# Patient Record
Sex: Male | Born: 1965
Health system: Southern US, Community
[De-identification: ages and names within clinical notes are randomized; demographics above are authoritative.]

## PROBLEM LIST (undated history)

## (undated) DIAGNOSIS — G629 Polyneuropathy, unspecified: Secondary | ICD-10-CM

## (undated) DIAGNOSIS — F419 Anxiety disorder, unspecified: Secondary | ICD-10-CM

## (undated) DIAGNOSIS — N189 Chronic kidney disease, unspecified: Secondary | ICD-10-CM

## (undated) DIAGNOSIS — E119 Type 2 diabetes mellitus without complications: Secondary | ICD-10-CM

## (undated) DIAGNOSIS — D45 Polycythemia vera: Secondary | ICD-10-CM

## (undated) DIAGNOSIS — I10 Essential (primary) hypertension: Secondary | ICD-10-CM

## (undated) DIAGNOSIS — H409 Unspecified glaucoma: Secondary | ICD-10-CM

## (undated) DIAGNOSIS — Z860101 Personal history of adenomatous and serrated colon polyps: Secondary | ICD-10-CM

## (undated) DIAGNOSIS — K709 Alcoholic liver disease, unspecified: Secondary | ICD-10-CM

## (undated) DIAGNOSIS — N289 Disorder of kidney and ureter, unspecified: Secondary | ICD-10-CM

## (undated) DIAGNOSIS — Z8711 Personal history of peptic ulcer disease: Secondary | ICD-10-CM

## (undated) DIAGNOSIS — F1910 Other psychoactive substance abuse, uncomplicated: Secondary | ICD-10-CM

## (undated) DIAGNOSIS — I639 Cerebral infarction, unspecified: Secondary | ICD-10-CM

## (undated) DIAGNOSIS — E7800 Pure hypercholesterolemia, unspecified: Secondary | ICD-10-CM

## (undated) DIAGNOSIS — Z86010 Personal history of colonic polyps: Secondary | ICD-10-CM

## (undated) DIAGNOSIS — M869 Osteomyelitis, unspecified: Secondary | ICD-10-CM

## (undated) DIAGNOSIS — Z8719 Personal history of other diseases of the digestive system: Secondary | ICD-10-CM

## (undated) HISTORY — DX: Personal history of adenomatous and serrated colon polyps: Z86.0101

## (undated) HISTORY — DX: Unspecified glaucoma: H40.9

## (undated) HISTORY — PX: EYE SURGERY: SHX253

## (undated) HISTORY — DX: Personal history of colonic polyps: Z86.010

## (undated) NOTE — *Deleted (*Deleted)
PCP - *** Cardiologist - ***  PPM/ICD - *** Device Orders - *** Rep Notified - ***  Chest x-ray - *** EKG - *** Stress Test - *** ECHO - *** Cardiac Cath - ***  Sleep Study - *** CPAP - ***  Fasting Blood Sugar - *** Checks Blood Sugar *** times a day  Blood Thinner Instructions: *** Aspirin Instructions: ***  ERAS Protcol - *** PRE-SURGERY Ensure or G2- ***  COVID TEST- ***   Anesthesia review: ***   -------------  SDW INSTRUCTIONS:  Your procedure is scheduled on Wednesday 11/30/19.  Report to [facility] Main Entrance "A" at 0915 A.M., and check in at the Admitting office.  Call this number if you have problems the morning of surgery: [phone]   Remember: Do not eat after midnight the night before your surgery  You may drink clear liquids until 0850 the morning of your surgery.   Clear liquids allowed are: Water, Non-Citrus Juices (without pulp), Carbonated Beverages, Clear Tea, Black Coffee Only, and Gatorade   Take these medicines the morning of surgery with A SIP OF WATER: acetaminophen (TYLENOL) if needed amLODipine (NORVASC)  ceFEPime (MAXIPIME)  dicyclomine (BENTYL)  gabapentin (NEURONTIN) pantoprazole (PROTONIX)  rosuvastatin (CRESTOR) Vancomycin Eye drops  glipiZIDE (GLUCOTROL)  11/2 day before: AM dose take as usual, no PM dose 11/3 surgery: NONE  insulin aspart (NOVOLOG) 11/2 day before: AM dose take as usual, no PM dose 11/3 surgery: NONE - if CBG >220 take half of usual correction dose  Insulin Glargine (BASAGLAR KWIKPEN)  11/2 day before: AM dose take as usual, PM dose take 7.5 units 11/3 surgery: 7.5 units  ** PLEASE check your blood sugar the morning of your surgery when you wake up and every 2 hours until you get to the Short Stay unit.  If your blood sugar is less than 70 mg/dL, you will need to treat for low blood sugar: - Do not take insulin. - Treat a low blood sugar (less than 70 mg/dL) with  cup of clear juice  (cranberry or apple), 4 glucose tablets, OR glucose gel. - Recheck blood sugar in 15 minutes after treatment (to make sure it is greater than 70 mg/dL). If your blood sugar is not greater than 70 mg/dL on recheck, call [phone] for further instructions.   Follow your surgeon's instructions on when to stop Aspirin and clopidogrel (PLAVIX) .  If no instructions were given by your surgeon then you will need to call the office to get those instructions.    As of today, STOP taking any Aleve, Naproxen, Ibuprofen, Motrin, Advil, Goody's, BC's, all herbal medications, fish oil, and all vitamins.    The Morning of Surgery  Do not wear jewelry  Do not wear lotions, powders, colognes, or deodorant Men may shave face and neck.  Do not bring valuables to the hospital.  [facility] is not responsible for any belongings or valuables.  If you are a smoker, DO NOT Smoke 24 hours prior to surgery  If you wear a CPAP at night please bring your mask the morning of surgery   Remember that you must have someone to transport you home after your surgery, and remain with you for 24 hours if you are discharged the same day.   Please bring cases for contacts, glasses, hearing aids, dentures or bridgework because it cannot be worn into surgery.    Leave your suitcase in the car.  After surgery it may be brought to your room.  For patients admitted to the hospital, discharge time will be determined by your treatment team.  Patients discharged the day of surgery will not be allowed to drive home.    Special instructions:   [facility]- Preparing For Surgery  Oral Hygiene is also important to reduce your risk of infection.  Remember - BRUSH YOUR TEETH THE MORNING OF SURGERY WITH YOUR REGULAR TOOTHPASTE  Please follow these instructions carefully.   1. Shower the NIGHT BEFORE SURGERY and the MORNING OF SURGERY with DIAL Soap.   2. Wash thoroughly, paying special attention to the area where your  surgery will be performed.  3. Thoroughly rinse your body with warm water from the neck down.  4. Pat yourself dry with a CLEAN TOWEL.  5. Wear CLEAN PAJAMAS to bed the night before surgery  6. Place CLEAN SHEETS on your bed the night of your first shower and DO NOT SLEEP WITH PETS.  7. Wear comfortable clothes the morning of surgery.    Day of Surgery:  Please shower the morning of surgery with the DIAL soap Do not apply any deodorants/lotions. Please wear clean clothes to the hospital/surgery center.   Remember to brush your teeth WITH YOUR REGULAR TOOTHPASTE.   Please read over the following fact sheets that you were given.  Patient denies shortness of breath, fever, cough and chest pain.

---

## 2001-09-23 ENCOUNTER — Emergency Department (HOSPITAL_COMMUNITY): Admission: EM | Admit: 2001-09-23 | Discharge: 2001-09-23 | Payer: Self-pay | Admitting: Emergency Medicine

## 2002-11-08 ENCOUNTER — Emergency Department (HOSPITAL_COMMUNITY): Admission: AD | Admit: 2002-11-08 | Discharge: 2002-11-08 | Payer: Self-pay | Admitting: Family Medicine

## 2002-11-11 ENCOUNTER — Encounter: Admission: RE | Admit: 2002-11-11 | Discharge: 2002-11-11 | Payer: Self-pay | Admitting: Internal Medicine

## 2003-10-19 ENCOUNTER — Ambulatory Visit: Payer: Self-pay | Admitting: *Deleted

## 2003-10-19 ENCOUNTER — Ambulatory Visit: Payer: Self-pay | Admitting: Nurse Practitioner

## 2004-01-28 DIAGNOSIS — D45 Polycythemia vera: Secondary | ICD-10-CM

## 2004-01-28 HISTORY — DX: Polycythemia vera: D45

## 2004-01-31 ENCOUNTER — Ambulatory Visit: Payer: Self-pay | Admitting: Hematology & Oncology

## 2004-02-09 ENCOUNTER — Ambulatory Visit (HOSPITAL_COMMUNITY): Admission: RE | Admit: 2004-02-09 | Discharge: 2004-02-09 | Payer: Self-pay | Admitting: Hematology & Oncology

## 2004-03-21 ENCOUNTER — Ambulatory Visit: Payer: Self-pay | Admitting: Nurse Practitioner

## 2004-03-25 ENCOUNTER — Ambulatory Visit: Payer: Self-pay | Admitting: Hematology & Oncology

## 2004-04-27 ENCOUNTER — Emergency Department (HOSPITAL_COMMUNITY): Admission: EM | Admit: 2004-04-27 | Discharge: 2004-04-27 | Payer: Self-pay | Admitting: Emergency Medicine

## 2004-05-02 ENCOUNTER — Emergency Department (HOSPITAL_COMMUNITY): Admission: EM | Admit: 2004-05-02 | Discharge: 2004-05-02 | Payer: Self-pay | Admitting: Emergency Medicine

## 2004-05-16 ENCOUNTER — Ambulatory Visit: Payer: Self-pay | Admitting: Hematology & Oncology

## 2004-07-03 ENCOUNTER — Ambulatory Visit: Payer: Self-pay | Admitting: Hematology & Oncology

## 2004-08-21 ENCOUNTER — Ambulatory Visit: Payer: Self-pay | Admitting: Hematology & Oncology

## 2004-10-16 ENCOUNTER — Ambulatory Visit: Payer: Self-pay | Admitting: Hematology & Oncology

## 2004-11-22 ENCOUNTER — Ambulatory Visit: Payer: Self-pay | Admitting: Nurse Practitioner

## 2005-01-01 ENCOUNTER — Ambulatory Visit: Payer: Self-pay | Admitting: Hematology & Oncology

## 2005-02-28 ENCOUNTER — Ambulatory Visit: Payer: Self-pay | Admitting: Hematology & Oncology

## 2005-05-01 ENCOUNTER — Ambulatory Visit: Payer: Self-pay | Admitting: Hematology & Oncology

## 2005-05-23 LAB — CBC WITH DIFFERENTIAL/PLATELET
BASO%: 0.7 % (ref 0.0–2.0)
Basophils Absolute: 0 10*3/uL (ref 0.0–0.1)
EOS%: 4.7 % (ref 0.0–7.0)
HGB: 14.9 g/dL (ref 13.0–17.1)
MCH: 26.8 pg — ABNORMAL LOW (ref 28.0–33.4)
MCV: 81.8 fL (ref 81.6–98.0)
MONO%: 10.6 % (ref 0.0–13.0)
NEUT#: 1.8 10*3/uL (ref 1.5–6.5)
RBC: 5.59 10*6/uL (ref 4.20–5.71)
RDW: 16.1 % — ABNORMAL HIGH (ref 11.2–14.6)
lymph#: 2.3 10*3/uL (ref 0.9–3.3)

## 2005-07-24 ENCOUNTER — Ambulatory Visit: Payer: Self-pay | Admitting: Family Medicine

## 2005-08-12 ENCOUNTER — Ambulatory Visit: Payer: Self-pay | Admitting: Hematology & Oncology

## 2005-09-19 LAB — CBC WITH DIFFERENTIAL/PLATELET
BASO%: 1.1 % (ref 0.0–2.0)
Basophils Absolute: 0.1 10*3/uL (ref 0.0–0.1)
EOS%: 4.3 % (ref 0.0–7.0)
HCT: 48.3 % (ref 38.7–49.9)
LYMPH%: 42.7 % (ref 14.0–48.0)
MCH: 27.4 pg — ABNORMAL LOW (ref 28.0–33.4)
MCHC: 31.5 g/dL — ABNORMAL LOW (ref 32.0–35.9)
MCV: 87 fL (ref 81.6–98.0)
NEUT%: 40.4 % (ref 40.0–75.0)
Platelets: 150 10*3/uL (ref 145–400)

## 2005-09-30 ENCOUNTER — Ambulatory Visit: Payer: Self-pay | Admitting: Hematology & Oncology

## 2005-10-07 LAB — CBC WITH DIFFERENTIAL/PLATELET
BASO%: 1.2 % (ref 0.0–2.0)
EOS%: 3.2 % (ref 0.0–7.0)
MCH: 28.1 pg (ref 28.0–33.4)
MCHC: 32.3 g/dL (ref 32.0–35.9)
MCV: 86.9 fL (ref 81.6–98.0)
MONO%: 13.3 % — ABNORMAL HIGH (ref 0.0–13.0)
NEUT%: 45 % (ref 40.0–75.0)
RDW: 15.2 % — ABNORMAL HIGH (ref 11.2–14.6)
lymph#: 2 10*3/uL (ref 0.9–3.3)

## 2005-11-05 ENCOUNTER — Ambulatory Visit: Payer: Self-pay | Admitting: Nurse Practitioner

## 2005-12-10 ENCOUNTER — Ambulatory Visit: Payer: Self-pay | Admitting: Hematology & Oncology

## 2006-02-10 ENCOUNTER — Ambulatory Visit: Payer: Self-pay | Admitting: Hematology & Oncology

## 2006-02-19 LAB — CBC WITH DIFFERENTIAL/PLATELET
Basophils Absolute: 0 10*3/uL (ref 0.0–0.1)
Eosinophils Absolute: 0.2 10*3/uL (ref 0.0–0.5)
HGB: 13.6 g/dL (ref 13.0–17.1)
MCV: 79.2 fL — ABNORMAL LOW (ref 81.6–98.0)
MONO#: 0.7 10*3/uL (ref 0.1–0.9)
MONO%: 12.5 % (ref 0.0–13.0)
NEUT#: 3 10*3/uL (ref 1.5–6.5)
RBC: 5.27 10*6/uL (ref 4.20–5.71)
RDW: 16.9 % — ABNORMAL HIGH (ref 11.2–14.6)
WBC: 6 10*3/uL (ref 4.0–10.0)
lymph#: 2 10*3/uL (ref 0.9–3.3)

## 2006-04-07 ENCOUNTER — Emergency Department (HOSPITAL_COMMUNITY): Admission: EM | Admit: 2006-04-07 | Discharge: 2006-04-07 | Payer: Self-pay | Admitting: Emergency Medicine

## 2006-06-16 ENCOUNTER — Ambulatory Visit: Payer: Self-pay | Admitting: Hematology & Oncology

## 2006-07-14 ENCOUNTER — Ambulatory Visit: Payer: Self-pay | Admitting: Internal Medicine

## 2006-07-16 ENCOUNTER — Ambulatory Visit (HOSPITAL_COMMUNITY): Admission: RE | Admit: 2006-07-16 | Discharge: 2006-07-16 | Payer: Self-pay | Admitting: Nurse Practitioner

## 2006-08-10 ENCOUNTER — Ambulatory Visit: Payer: Self-pay | Admitting: Hematology & Oncology

## 2006-08-12 LAB — CBC WITH DIFFERENTIAL/PLATELET
Basophils Absolute: 0.1 10*3/uL (ref 0.0–0.1)
Eosinophils Absolute: 0.3 10*3/uL (ref 0.0–0.5)
HCT: 47.3 % (ref 38.7–49.9)
HGB: 14.7 g/dL (ref 13.0–17.1)
LYMPH%: 49.8 % — ABNORMAL HIGH (ref 14.0–48.0)
MCV: 82.4 fL (ref 81.6–98.0)
MONO#: 0.5 10*3/uL (ref 0.1–0.9)
MONO%: 11.8 % (ref 0.0–13.0)
NEUT#: 1.3 10*3/uL — ABNORMAL LOW (ref 1.5–6.5)
Platelets: 177 10*3/uL (ref 145–400)
RBC: 5.74 10*6/uL — ABNORMAL HIGH (ref 4.20–5.71)
WBC: 4.5 10*3/uL (ref 4.0–10.0)

## 2006-08-20 LAB — CBC WITH DIFFERENTIAL/PLATELET
BASO%: 1.1 % (ref 0.0–2.0)
Eosinophils Absolute: 0.3 10*3/uL (ref 0.0–0.5)
LYMPH%: 40.8 % (ref 14.0–48.0)
MCHC: 31.2 g/dL — ABNORMAL LOW (ref 32.0–35.9)
MONO#: 0.7 10*3/uL (ref 0.1–0.9)
NEUT#: 1.9 10*3/uL (ref 1.5–6.5)
RBC: 5.17 10*6/uL (ref 4.20–5.71)
RDW: 14.8 % — ABNORMAL HIGH (ref 11.2–14.6)
WBC: 5 10*3/uL (ref 4.0–10.0)
lymph#: 2 10*3/uL (ref 0.9–3.3)

## 2006-10-14 ENCOUNTER — Encounter (INDEPENDENT_AMBULATORY_CARE_PROVIDER_SITE_OTHER): Payer: Self-pay | Admitting: *Deleted

## 2006-11-04 ENCOUNTER — Ambulatory Visit: Payer: Self-pay | Admitting: Hematology & Oncology

## 2007-01-18 ENCOUNTER — Ambulatory Visit: Payer: Self-pay | Admitting: Hematology & Oncology

## 2007-01-22 LAB — CBC WITH DIFFERENTIAL/PLATELET
BASO%: 1.2 % (ref 0.0–2.0)
Eosinophils Absolute: 0.4 10*3/uL (ref 0.0–0.5)
HCT: 45.6 % (ref 38.7–49.9)
LYMPH%: 45 % (ref 14.0–48.0)
MCHC: 30.8 g/dL — ABNORMAL LOW (ref 32.0–35.9)
MONO#: 0.6 10*3/uL (ref 0.1–0.9)
NEUT#: 1.3 10*3/uL — ABNORMAL LOW (ref 1.5–6.5)
NEUT%: 29.1 % — ABNORMAL LOW (ref 40.0–75.0)
Platelets: 158 10*3/uL (ref 145–400)
WBC: 4.4 10*3/uL (ref 4.0–10.0)
lymph#: 2 10*3/uL (ref 0.9–3.3)

## 2007-03-10 ENCOUNTER — Ambulatory Visit: Payer: Self-pay | Admitting: Hematology & Oncology

## 2007-03-12 LAB — CBC WITH DIFFERENTIAL/PLATELET
Basophils Absolute: 0 10*3/uL (ref 0.0–0.1)
Eosinophils Absolute: 0.3 10*3/uL (ref 0.0–0.5)
LYMPH%: 46.7 % (ref 14.0–48.0)
MCH: 26.7 pg — ABNORMAL LOW (ref 28.0–33.4)
MCHC: 32.8 g/dL (ref 32.0–35.9)
MONO%: 12.6 % (ref 0.0–13.0)
NEUT%: 31.9 % — ABNORMAL LOW (ref 40.0–75.0)
RDW: 18.9 % — ABNORMAL HIGH (ref 11.2–14.6)
WBC: 4.3 10*3/uL (ref 4.0–10.0)
lymph#: 2 10*3/uL (ref 0.9–3.3)

## 2007-04-15 ENCOUNTER — Ambulatory Visit: Payer: Self-pay | Admitting: Family Medicine

## 2007-04-15 ENCOUNTER — Encounter (INDEPENDENT_AMBULATORY_CARE_PROVIDER_SITE_OTHER): Payer: Self-pay | Admitting: Nurse Practitioner

## 2007-04-15 LAB — CONVERTED CEMR LAB
Albumin: 3.9 g/dL (ref 3.5–5.2)
Alkaline Phosphatase: 58 units/L (ref 39–117)
CO2: 21 meq/L (ref 19–32)
Chloride: 104 meq/L (ref 96–112)
Cholesterol: 199 mg/dL (ref 0–200)
Glucose, Bld: 180 mg/dL — ABNORMAL HIGH (ref 70–99)
LDL Cholesterol: 118 mg/dL — ABNORMAL HIGH (ref 0–99)
Lymphocytes Relative: 49 % — ABNORMAL HIGH (ref 12–46)
Lymphs Abs: 2.1 10*3/uL (ref 0.7–4.0)
Neutro Abs: 1.3 10*3/uL — ABNORMAL LOW (ref 1.7–7.7)
Neutrophils Relative %: 30 % — ABNORMAL LOW (ref 43–77)
Platelets: 181 10*3/uL (ref 150–400)
Potassium: 4.5 meq/L (ref 3.5–5.3)
Sodium: 139 meq/L (ref 135–145)
Total Protein: 7.1 g/dL (ref 6.0–8.3)
Triglycerides: 183 mg/dL — ABNORMAL HIGH (ref <150)
WBC: 4.2 10*3/uL (ref 4.0–10.5)

## 2007-06-04 ENCOUNTER — Ambulatory Visit: Payer: Self-pay | Admitting: Hematology & Oncology

## 2007-09-24 ENCOUNTER — Ambulatory Visit: Payer: Self-pay | Admitting: Oncology

## 2007-09-29 LAB — CBC WITH DIFFERENTIAL/PLATELET
BASO%: 0.6 % (ref 0.0–2.0)
Basophils Absolute: 0 10*3/uL (ref 0.0–0.1)
Eosinophils Absolute: 0.4 10*3/uL (ref 0.0–0.5)
HCT: 43.1 % (ref 38.7–49.9)
HGB: 14.4 g/dL (ref 13.0–17.1)
LYMPH%: 48.9 % — ABNORMAL HIGH (ref 14.0–48.0)
MCHC: 33.6 g/dL (ref 32.0–35.9)
MONO#: 0.4 10*3/uL (ref 0.1–0.9)
NEUT#: 1.6 10*3/uL (ref 1.5–6.5)
NEUT%: 34.1 % — ABNORMAL LOW (ref 40.0–75.0)
Platelets: 161 10*3/uL (ref 145–400)
WBC: 4.8 10*3/uL (ref 4.0–10.0)
lymph#: 2.3 10*3/uL (ref 0.9–3.3)

## 2007-09-29 LAB — CHCC SMEAR

## 2007-09-29 LAB — MORPHOLOGY: PLT EST: ADEQUATE

## 2007-10-06 LAB — COMPREHENSIVE METABOLIC PANEL
ALT: 21 U/L (ref 0–53)
AST: 21 U/L (ref 0–37)
CO2: 20 mEq/L (ref 19–32)
Calcium: 9.3 mg/dL (ref 8.4–10.5)
Chloride: 106 mEq/L (ref 96–112)
Creatinine, Ser: 0.9 mg/dL (ref 0.40–1.50)
Sodium: 139 mEq/L (ref 135–145)
Total Bilirubin: 0.5 mg/dL (ref 0.3–1.2)
Total Protein: 7 g/dL (ref 6.0–8.3)

## 2007-10-06 LAB — IRON AND TIBC
%SAT: 32 % (ref 20–55)
TIBC: 389 ug/dL (ref 215–435)

## 2007-10-06 LAB — JAK2 EXONS 12 & 13 MUTATION, REFLEX: JAK2 Exons 12 & 13: 13

## 2007-10-06 LAB — ERYTHROPOIETIN: Erythropoietin: 41.4 m[IU]/mL — ABNORMAL HIGH (ref 2.6–34.0)

## 2007-10-06 LAB — JAK2 GENOTYPR

## 2007-11-10 ENCOUNTER — Encounter (INDEPENDENT_AMBULATORY_CARE_PROVIDER_SITE_OTHER): Payer: Self-pay | Admitting: Internal Medicine

## 2007-11-10 ENCOUNTER — Ambulatory Visit: Payer: Self-pay | Admitting: Internal Medicine

## 2007-11-10 LAB — CONVERTED CEMR LAB
Albumin: 4.1 g/dL (ref 3.5–5.2)
Alkaline Phosphatase: 76 units/L (ref 39–117)
BUN: 8 mg/dL (ref 6–23)
Benzodiazepines.: NEGATIVE
CO2: 25 meq/L (ref 19–32)
Calcium: 9.2 mg/dL (ref 8.4–10.5)
Chloride: 103 meq/L (ref 96–112)
Creatinine,U: 204.1 mg/dL
Eosinophils Absolute: 0.4 10*3/uL (ref 0.0–0.7)
Glucose, Bld: 175 mg/dL — ABNORMAL HIGH (ref 70–99)
Lymphs Abs: 2.3 10*3/uL (ref 0.7–4.0)
MCV: 89.8 fL (ref 78.0–100.0)
Methadone: NEGATIVE
Microalb, Ur: 2.39 mg/dL — ABNORMAL HIGH (ref 0.00–1.89)
Monocytes Relative: 13 % — ABNORMAL HIGH (ref 3–12)
Neutrophils Relative %: 35 % — ABNORMAL LOW (ref 43–77)
PSA: 0.38 ng/mL (ref 0.10–4.00)
Potassium: 4.4 meq/L (ref 3.5–5.3)
Propoxyphene: NEGATIVE
RBC: 5.11 M/uL (ref 4.22–5.81)
WBC: 5.2 10*3/uL (ref 4.0–10.5)

## 2008-02-16 ENCOUNTER — Ambulatory Visit: Payer: Self-pay | Admitting: Oncology

## 2008-04-05 ENCOUNTER — Ambulatory Visit: Payer: Self-pay | Admitting: Oncology

## 2008-04-07 LAB — COMPREHENSIVE METABOLIC PANEL
ALT: 19 U/L (ref 0–53)
BUN: 5 mg/dL — ABNORMAL LOW (ref 6–23)
CO2: 24 mEq/L (ref 19–32)
Calcium: 9.2 mg/dL (ref 8.4–10.5)
Chloride: 100 mEq/L (ref 96–112)
Creatinine, Ser: 1.01 mg/dL (ref 0.40–1.50)
Glucose, Bld: 160 mg/dL — ABNORMAL HIGH (ref 70–99)
Total Bilirubin: 0.7 mg/dL (ref 0.3–1.2)

## 2008-04-07 LAB — CBC WITH DIFFERENTIAL/PLATELET
BASO%: 0.7 % (ref 0.0–2.0)
EOS%: 7.8 % — ABNORMAL HIGH (ref 0.0–7.0)
LYMPH%: 43.2 % (ref 14.0–49.0)
MCH: 29.6 pg (ref 27.2–33.4)
MCHC: 33.2 g/dL (ref 32.0–36.0)
MONO#: 0.5 10*3/uL (ref 0.1–0.9)
NEUT%: 38.2 % — ABNORMAL LOW (ref 39.0–75.0)
Platelets: 159 10*3/uL (ref 140–400)
RBC: 5.27 10*6/uL (ref 4.20–5.82)
WBC: 4.8 10*3/uL (ref 4.0–10.3)

## 2008-04-07 LAB — MORPHOLOGY

## 2008-04-07 LAB — CHCC SMEAR

## 2008-07-12 ENCOUNTER — Ambulatory Visit: Payer: Self-pay | Admitting: Family Medicine

## 2009-01-10 ENCOUNTER — Ambulatory Visit: Payer: Self-pay | Admitting: Oncology

## 2009-02-27 ENCOUNTER — Ambulatory Visit: Payer: Self-pay | Admitting: Internal Medicine

## 2009-03-14 ENCOUNTER — Ambulatory Visit: Payer: Self-pay | Admitting: Oncology

## 2009-03-30 LAB — MORPHOLOGY: RBC Comments: NORMAL

## 2009-03-30 LAB — CBC WITH DIFFERENTIAL/PLATELET
BASO%: 0.5 % (ref 0.0–2.0)
Eosinophils Absolute: 0.3 10*3/uL (ref 0.0–0.5)
MCHC: 33.2 g/dL (ref 32.0–36.0)
MONO#: 0.5 10*3/uL (ref 0.1–0.9)
MONO%: 10.2 % (ref 0.0–14.0)
NEUT#: 2.1 10*3/uL (ref 1.5–6.5)
RBC: 5.07 10*6/uL (ref 4.20–5.82)
RDW: 13.6 % (ref 11.0–14.6)
WBC: 4.7 10*3/uL (ref 4.0–10.3)

## 2009-03-30 LAB — CHCC SMEAR

## 2009-10-04 ENCOUNTER — Ambulatory Visit: Payer: Self-pay | Admitting: Family Medicine

## 2010-05-02 ENCOUNTER — Emergency Department (HOSPITAL_COMMUNITY): Admission: EM | Admit: 2010-05-02 | Payer: Self-pay | Source: Home / Self Care

## 2010-10-22 ENCOUNTER — Inpatient Hospital Stay (INDEPENDENT_AMBULATORY_CARE_PROVIDER_SITE_OTHER)
Admission: RE | Admit: 2010-10-22 | Discharge: 2010-10-22 | Disposition: A | Discharge status: Home / Self Care | Payer: Self-pay | Source: Ambulatory Visit | Attending: Family Medicine | Admitting: Family Medicine

## 2010-10-22 DIAGNOSIS — Z760 Encounter for issue of repeat prescription: Secondary | ICD-10-CM

## 2011-05-06 ENCOUNTER — Other Ambulatory Visit: Payer: Self-pay | Admitting: Family Medicine

## 2011-05-06 DIAGNOSIS — N63 Unspecified lump in unspecified breast: Secondary | ICD-10-CM

## 2011-05-12 ENCOUNTER — Ambulatory Visit
Admission: RE | Admit: 2011-05-12 | Discharge: 2011-05-12 | Disposition: A | Discharge status: Home / Self Care | Payer: Self-pay | Source: Ambulatory Visit | Attending: Family Medicine | Admitting: Family Medicine

## 2011-05-12 DIAGNOSIS — N63 Unspecified lump in unspecified breast: Secondary | ICD-10-CM

## 2011-09-03 ENCOUNTER — Encounter (HOSPITAL_COMMUNITY): Payer: Self-pay | Admitting: Emergency Medicine

## 2011-09-03 ENCOUNTER — Emergency Department (HOSPITAL_COMMUNITY)
Admission: EM | Admit: 2011-09-03 | Discharge: 2011-09-03 | Disposition: A | Discharge status: Home / Self Care | Payer: Self-pay | Attending: Emergency Medicine | Admitting: Emergency Medicine

## 2011-09-03 DIAGNOSIS — E119 Type 2 diabetes mellitus without complications: Secondary | ICD-10-CM | POA: Insufficient documentation

## 2011-09-03 DIAGNOSIS — F17200 Nicotine dependence, unspecified, uncomplicated: Secondary | ICD-10-CM | POA: Insufficient documentation

## 2011-09-03 DIAGNOSIS — N61 Mastitis without abscess: Secondary | ICD-10-CM

## 2011-09-03 DIAGNOSIS — N63 Unspecified lump in unspecified breast: Secondary | ICD-10-CM | POA: Insufficient documentation

## 2011-09-03 MED ORDER — SULFAMETHOXAZOLE-TRIMETHOPRIM 800-160 MG PO TABS: 1.0000 | ORAL_TABLET | Freq: Two times a day (BID) | ORAL | Status: AC

## 2011-09-03 MED ORDER — CEPHALEXIN 500 MG PO CAPS: 500.0000 mg | ORAL_CAPSULE | Freq: Four times a day (QID) | ORAL | Status: AC

## 2011-09-03 NOTE — ED Provider Notes (Signed)
History     CSN: [id number]  Arrival date & time 09/03/11  0919   First MD Initiated Contact with Patient 09/03/11 1138      Chief Complaint  Patient presents with  . Breast Mass    (Consider location/radiation/quality/duration/timing/severity/associated sxs/prior treatment) HPI Comments: Patient comes in today with a chief complaint of a mass of the left breast.  He had a mammogram of the breast performed on 05/22/11.  No mass identified on imaging.  Gynecomastia was the only finding.  Patient reports that the mass on his breast has been increasing in size, increased erythema, and pain over the past several of days.  No fever or chills.  He has not attempted any type of treatment.  No drainage from the mass.  No nipple discharge or retraction.    The history is provided by the patient.    Past Medical History  Diagnosis Date  . Diabetes mellitus     History reviewed. No pertinent past surgical history.  History reviewed. No pertinent family history.  History  Substance Use Topics  . Smoking status: Current Everyday Smoker  . Smokeless tobacco: Not on file  . Alcohol Use: Yes      Review of Systems  Constitutional: Negative for fever and chills.  Respiratory: Negative for shortness of breath.   Gastrointestinal: Negative for nausea and vomiting.  Skin: Positive for color change.  All other systems reviewed and are negative.    Allergies  Review of patient's allergies indicates no known allergies.  Home Medications   Current Outpatient Rx  Name Route Sig Dispense Refill  . ASPIRIN EC 325 MG PO TBEC Oral Take 325 mg by mouth daily.    . ATORVASTATIN CALCIUM 10 MG PO TABS Oral Take 10 mg by mouth daily.    [name] GABAPENTIN 400 MG PO CAPS Oral Take 800 mg by mouth every 12 (twelve) hours.    [name] GLIPIZIDE 5 MG PO TABS Oral Take 5 mg by mouth daily after breakfast.    . LISINOPRIL 10 MG PO TABS Oral Take 10 mg by mouth daily.    [name] METFORMIN HCL 1000 MG PO TABS Oral Take  1,000 mg by mouth daily with breakfast.      BP 164/99  Pulse 103  Temp 97.9 F (36.6 C) (Oral)  Resp 16  SpO2 100%  Physical Exam  Nursing note and vitals reviewed. Constitutional: He appears well-developed and well-nourished. No distress.  HENT:  Head: Normocephalic and atraumatic.  Mouth/Throat: Oropharynx is clear and moist.  Neck: Normal range of motion. Neck supple.  Cardiovascular: Normal rate, regular rhythm and normal heart sounds.   Pulmonary/Chest: Effort normal and breath sounds normal.  Neurological: He is alert.  Skin: Skin is warm and dry. He is not diaphoretic.     Psychiatric: He has a normal mood and affect.    ED Course  Procedures (including critical care time)  Labs Reviewed - No data to display No results found.   No diagnosis found.  Bedside ultrasound used to evaluate.  No apparent fluid collection visualized.  MDM  Patient presenting with mass of his left breast for the past 3 months.  He has been previously evaluated for this with a mammogram, which was negative.  On exam today there is some erythema present.  No fluid collection visualized on ultrasound.  Patient given prescription for antibiotics.  Patient given referral to surgery for the patient to follow up with if the mass continues to be come larger.  Pascal Lux Chapin, PA-C 09/03/11 2101

## 2011-09-03 NOTE — ED Notes (Signed)
Pt c/o left breast mass x 3 months that had mammogram for and was not cancerous; pt sts increased size, redness and pain x several days

## 2011-09-04 NOTE — ED Provider Notes (Signed)
Medical screening examination/treatment/procedure(s) were performed by non-physician practitioner and as supervising physician I was immediately available for consultation/collaboration.  Ott Zimmerle, MD 09/04/11 0822 

## 2011-09-25 ENCOUNTER — Inpatient Hospital Stay (HOSPITAL_COMMUNITY)
Admission: EM | Admit: 2011-09-25 | Discharge: 2011-09-28 | DRG: 600 | Disposition: A | Discharge status: Home Health | Payer: MEDICAID | Attending: Internal Medicine | Admitting: Internal Medicine

## 2011-09-25 ENCOUNTER — Encounter (HOSPITAL_COMMUNITY): Payer: Self-pay | Admitting: *Deleted

## 2011-09-25 DIAGNOSIS — F1210 Cannabis abuse, uncomplicated: Secondary | ICD-10-CM | POA: Diagnosis present

## 2011-09-25 DIAGNOSIS — E1142 Type 2 diabetes mellitus with diabetic polyneuropathy: Secondary | ICD-10-CM | POA: Diagnosis present

## 2011-09-25 DIAGNOSIS — N189 Chronic kidney disease, unspecified: Secondary | ICD-10-CM | POA: Diagnosis present

## 2011-09-25 DIAGNOSIS — E7800 Pure hypercholesterolemia, unspecified: Secondary | ICD-10-CM | POA: Diagnosis present

## 2011-09-25 DIAGNOSIS — R739 Hyperglycemia, unspecified: Secondary | ICD-10-CM

## 2011-09-25 DIAGNOSIS — E871 Hypo-osmolality and hyponatremia: Secondary | ICD-10-CM | POA: Diagnosis present

## 2011-09-25 DIAGNOSIS — I129 Hypertensive chronic kidney disease with stage 1 through stage 4 chronic kidney disease, or unspecified chronic kidney disease: Secondary | ICD-10-CM | POA: Diagnosis present

## 2011-09-25 DIAGNOSIS — D709 Neutropenia, unspecified: Secondary | ICD-10-CM | POA: Diagnosis present

## 2011-09-25 DIAGNOSIS — N611 Abscess of the breast and nipple: Secondary | ICD-10-CM

## 2011-09-25 DIAGNOSIS — Z720 Tobacco use: Secondary | ICD-10-CM | POA: Diagnosis present

## 2011-09-25 DIAGNOSIS — E119 Type 2 diabetes mellitus without complications: Secondary | ICD-10-CM | POA: Diagnosis present

## 2011-09-25 DIAGNOSIS — F1020 Alcohol dependence, uncomplicated: Secondary | ICD-10-CM | POA: Diagnosis present

## 2011-09-25 DIAGNOSIS — R21 Rash and other nonspecific skin eruption: Secondary | ICD-10-CM | POA: Diagnosis present

## 2011-09-25 DIAGNOSIS — Z8711 Personal history of peptic ulcer disease: Secondary | ICD-10-CM

## 2011-09-25 DIAGNOSIS — F1010 Alcohol abuse, uncomplicated: Secondary | ICD-10-CM

## 2011-09-25 DIAGNOSIS — Z23 Encounter for immunization: Secondary | ICD-10-CM

## 2011-09-25 DIAGNOSIS — F411 Generalized anxiety disorder: Secondary | ICD-10-CM | POA: Diagnosis present

## 2011-09-25 DIAGNOSIS — G609 Hereditary and idiopathic neuropathy, unspecified: Secondary | ICD-10-CM | POA: Diagnosis present

## 2011-09-25 DIAGNOSIS — N61 Mastitis without abscess: Secondary | ICD-10-CM

## 2011-09-25 DIAGNOSIS — Z7982 Long term (current) use of aspirin: Secondary | ICD-10-CM

## 2011-09-25 DIAGNOSIS — I10 Essential (primary) hypertension: Secondary | ICD-10-CM | POA: Diagnosis present

## 2011-09-25 DIAGNOSIS — F17200 Nicotine dependence, unspecified, uncomplicated: Secondary | ICD-10-CM | POA: Diagnosis present

## 2011-09-25 DIAGNOSIS — B029 Zoster without complications: Secondary | ICD-10-CM

## 2011-09-25 DIAGNOSIS — F1410 Cocaine abuse, uncomplicated: Secondary | ICD-10-CM | POA: Diagnosis present

## 2011-09-25 DIAGNOSIS — K709 Alcoholic liver disease, unspecified: Secondary | ICD-10-CM | POA: Diagnosis present

## 2011-09-25 HISTORY — DX: Chronic kidney disease, unspecified: N18.9

## 2011-09-25 HISTORY — DX: Polyneuropathy, unspecified: G62.9

## 2011-09-25 HISTORY — DX: Personal history of peptic ulcer disease: Z87.11

## 2011-09-25 HISTORY — DX: Essential (primary) hypertension: I10

## 2011-09-25 HISTORY — DX: Pure hypercholesterolemia, unspecified: E78.00

## 2011-09-25 HISTORY — DX: Personal history of other diseases of the digestive system: Z87.19

## 2011-09-25 HISTORY — DX: Other psychoactive substance abuse, uncomplicated: F19.10

## 2011-09-25 HISTORY — DX: Alcoholic liver disease, unspecified: K70.9

## 2011-09-25 HISTORY — DX: Anxiety disorder, unspecified: F41.9

## 2011-09-25 HISTORY — DX: Type 2 diabetes mellitus without complications: E11.9

## 2011-09-25 LAB — COMPREHENSIVE METABOLIC PANEL
Albumin: 2.8 g/dL — ABNORMAL LOW (ref 3.5–5.2)
BUN: 6 mg/dL (ref 6–23)
CO2: 21 mEq/L (ref 19–32)
Chloride: 93 mEq/L — ABNORMAL LOW (ref 96–112)
Creatinine, Ser: 1.05 mg/dL (ref 0.50–1.35)
Glucose, Bld: 312 mg/dL — ABNORMAL HIGH (ref 70–99)
Sodium: 127 mEq/L — ABNORMAL LOW (ref 135–145)
Total Protein: 6.9 g/dL (ref 6.0–8.3)

## 2011-09-25 LAB — CBC WITH DIFFERENTIAL/PLATELET
Basophils Relative: 1 % (ref 0–1)
Eosinophils Relative: 6 % — ABNORMAL HIGH (ref 0–5)
Hemoglobin: 15.3 g/dL (ref 13.0–17.0)
MCH: 28.3 pg (ref 26.0–34.0)
MCV: 84.8 fL (ref 78.0–100.0)
Monocytes Absolute: 0.3 10*3/uL (ref 0.1–1.0)
Monocytes Relative: 12 % (ref 3–12)
Neutrophils Relative %: 28 % — ABNORMAL LOW (ref 43–77)
Platelets: 210 10*3/uL (ref 150–400)
RBC: 5.4 MIL/uL (ref 4.22–5.81)
WBC: 2.9 10*3/uL — ABNORMAL LOW (ref 4.0–10.5)

## 2011-09-25 LAB — CBC
MCH: 29 pg (ref 26.0–34.0)
Platelets: 229 10*3/uL (ref 150–400)
RBC: 5.59 MIL/uL (ref 4.22–5.81)
WBC: 2.7 10*3/uL — ABNORMAL LOW (ref 4.0–10.5)

## 2011-09-25 LAB — CREATININE, SERUM
Creatinine, Ser: 0.86 mg/dL (ref 0.50–1.35)
GFR calc non Af Amer: >90 mL/min (ref >90)

## 2011-09-25 LAB — GLUCOSE, CAPILLARY: Glucose-Capillary: 240 mg/dL — ABNORMAL HIGH (ref 70–99)

## 2011-09-25 MED ORDER — VANCOMYCIN HCL IN DEXTROSE 1-5 GM/200ML-% IV SOLN
1000.0000 mg | Freq: Once | INTRAVENOUS | Status: AC
  Administered 2011-09-25: 1000 mg via INTRAVENOUS
  Filled 2011-09-25: qty 200

## 2011-09-25 MED ORDER — ONDANSETRON HCL 4 MG PO TABS: 4.0000 mg | ORAL_TABLET | Freq: Four times a day (QID) | ORAL | Status: DC | PRN

## 2011-09-25 MED ORDER — THIAMINE HCL 100 MG/ML IJ SOLN
100.0000 mg | Freq: Every day | INTRAMUSCULAR | Status: DC
  Filled 2011-09-25 (×4): qty 1

## 2011-09-25 MED ORDER — INSULIN ASPART 100 UNIT/ML ~~LOC~~ SOLN
0.0000 [IU] | Freq: Three times a day (TID) | SUBCUTANEOUS | Status: DC
  Administered 2011-09-25: 3 [IU] via SUBCUTANEOUS
  Administered 2011-09-26: 1 [IU] via SUBCUTANEOUS
  Administered 2011-09-26: 5 [IU] via SUBCUTANEOUS
  Administered 2011-09-26 – 2011-09-27 (×2): 2 [IU] via SUBCUTANEOUS
  Administered 2011-09-27: 3 [IU] via SUBCUTANEOUS
  Administered 2011-09-27: 2 [IU] via SUBCUTANEOUS
  Administered 2011-09-28: 3 [IU] via SUBCUTANEOUS
  Administered 2011-09-28: 2 [IU] via SUBCUTANEOUS

## 2011-09-25 MED ORDER — ADULT MULTIVITAMIN W/MINERALS CH
1.0000 | ORAL_TABLET | Freq: Every day | ORAL | Status: DC
  Administered 2011-09-25 – 2011-09-28 (×4): 1 via ORAL
  Filled 2011-09-25 (×4): qty 1

## 2011-09-25 MED ORDER — ALBUTEROL SULFATE (5 MG/ML) 0.5% IN NEBU: 2.5000 mg | INHALATION_SOLUTION | RESPIRATORY_TRACT | Status: DC | PRN

## 2011-09-25 MED ORDER — VANCOMYCIN HCL IN DEXTROSE 1-5 GM/200ML-% IV SOLN
1000.0000 mg | Freq: Two times a day (BID) | INTRAVENOUS | Status: DC
  Administered 2011-09-26 – 2011-09-28 (×6): 1000 mg via INTRAVENOUS
  Filled 2011-09-25 (×7): qty 200

## 2011-09-25 MED ORDER — ONDANSETRON HCL 4 MG/2ML IJ SOLN: 4.0000 mg | Freq: Four times a day (QID) | INTRAMUSCULAR | Status: DC | PRN

## 2011-09-25 MED ORDER — SODIUM CHLORIDE 0.9 % IV SOLN: INTRAVENOUS | Status: DC

## 2011-09-25 MED ORDER — FOLIC ACID 1 MG PO TABS
1.0000 mg | ORAL_TABLET | Freq: Every day | ORAL | Status: DC
  Administered 2011-09-25 – 2011-09-28 (×4): 1 mg via ORAL
  Filled 2011-09-25 (×4): qty 1

## 2011-09-25 MED ORDER — SODIUM CHLORIDE 0.9 % IV BOLUS (SEPSIS)
1000.0000 mL | Freq: Once | INTRAVENOUS | Status: AC
  Administered 2011-09-25: 1000 mL via INTRAVENOUS

## 2011-09-25 MED ORDER — GABAPENTIN 300 MG PO CAPS
300.0000 mg | ORAL_CAPSULE | Freq: Three times a day (TID) | ORAL | Status: DC
  Administered 2011-09-25 – 2011-09-28 (×9): 300 mg via ORAL
  Filled 2011-09-25 (×11): qty 1

## 2011-09-25 MED ORDER — ONDANSETRON HCL 4 MG/2ML IJ SOLN: 4.0000 mg | Freq: Three times a day (TID) | INTRAMUSCULAR | Status: DC | PRN

## 2011-09-25 MED ORDER — ENOXAPARIN SODIUM 40 MG/0.4ML ~~LOC~~ SOLN
40.0000 mg | SUBCUTANEOUS | Status: DC
  Administered 2011-09-25: 40 mg via SUBCUTANEOUS
  Filled 2011-09-25 (×2): qty 0.4

## 2011-09-25 MED ORDER — ACETAMINOPHEN 650 MG RE SUPP: 650.0000 mg | Freq: Four times a day (QID) | RECTAL | Status: DC | PRN

## 2011-09-25 MED ORDER — MORPHINE SULFATE 4 MG/ML IJ SOLN
4.0000 mg | Freq: Once | INTRAMUSCULAR | Status: AC
  Administered 2011-09-25: 4 mg via INTRAVENOUS
  Filled 2011-09-25: qty 1

## 2011-09-25 MED ORDER — MORPHINE SULFATE 2 MG/ML IJ SOLN
1.0000 mg | INTRAMUSCULAR | Status: DC | PRN
  Administered 2011-09-26 (×2): 1 mg via INTRAVENOUS
  Filled 2011-09-25 (×2): qty 1

## 2011-09-25 MED ORDER — HYDRALAZINE HCL 20 MG/ML IJ SOLN
5.0000 mg | Freq: Once | INTRAMUSCULAR | Status: AC
  Administered 2011-09-25: 5 mg via INTRAVENOUS
  Filled 2011-09-25: qty 0.25

## 2011-09-25 MED ORDER — DOCUSATE SODIUM 100 MG PO CAPS
100.0000 mg | ORAL_CAPSULE | Freq: Two times a day (BID) | ORAL | Status: DC
  Administered 2011-09-25 – 2011-09-28 (×6): 100 mg via ORAL
  Filled 2011-09-25 (×8): qty 1

## 2011-09-25 MED ORDER — HYDROMORPHONE HCL PF 1 MG/ML IJ SOLN: 1.0000 mg | INTRAMUSCULAR | Status: DC | PRN

## 2011-09-25 MED ORDER — ACETAMINOPHEN 325 MG PO TABS: 650.0000 mg | ORAL_TABLET | Freq: Four times a day (QID) | ORAL | Status: DC | PRN

## 2011-09-25 MED ORDER — LORAZEPAM 1 MG PO TABS: 1.0000 mg | ORAL_TABLET | Freq: Four times a day (QID) | ORAL | Status: DC | PRN

## 2011-09-25 MED ORDER — ONDANSETRON HCL 4 MG/2ML IJ SOLN
4.0000 mg | Freq: Once | INTRAMUSCULAR | Status: AC
  Administered 2011-09-25: 4 mg via INTRAVENOUS
  Filled 2011-09-25: qty 2

## 2011-09-25 MED ORDER — SODIUM CHLORIDE 0.9 % IV SOLN
INTRAVENOUS | Status: DC
  Administered 2011-09-25: 75 mL/h via INTRAVENOUS
  Administered 2011-09-26: via INTRAVENOUS

## 2011-09-25 MED ORDER — HYDROCODONE-ACETAMINOPHEN 5-325 MG PO TABS
1.0000 | ORAL_TABLET | ORAL | Status: DC | PRN
  Administered 2011-09-25 – 2011-09-27 (×5): 2 via ORAL
  Filled 2011-09-25 (×6): qty 2

## 2011-09-25 MED ORDER — LORAZEPAM 2 MG/ML IJ SOLN: 1.0000 mg | Freq: Four times a day (QID) | INTRAMUSCULAR | Status: DC | PRN

## 2011-09-25 MED ORDER — SODIUM CHLORIDE 0.9 % IV SOLN
INTRAVENOUS | Status: DC
  Administered 2011-09-25: 14:00:00 via INTRAVENOUS

## 2011-09-25 MED ORDER — VITAMIN B-1 100 MG PO TABS
100.0000 mg | ORAL_TABLET | Freq: Every day | ORAL | Status: DC
  Administered 2011-09-25 – 2011-09-28 (×4): 100 mg via ORAL
  Filled 2011-09-25 (×4): qty 1

## 2011-09-25 MED ORDER — PIPERACILLIN-TAZOBACTAM 3.375 G IVPB
3.3750 g | Freq: Three times a day (TID) | INTRAVENOUS | Status: DC
  Administered 2011-09-25 – 2011-09-28 (×9): 3.375 g via INTRAVENOUS
  Filled 2011-09-25 (×12): qty 50

## 2011-09-25 MED ORDER — PIPERACILLIN-TAZOBACTAM 3.375 G IVPB: 3.3750 g | Freq: Once | INTRAVENOUS | Status: DC

## 2011-09-25 MED ORDER — LISINOPRIL 10 MG PO TABS
10.0000 mg | ORAL_TABLET | Freq: Every day | ORAL | Status: DC
  Administered 2011-09-25: 10 mg via ORAL
  Filled 2011-09-25 (×2): qty 1

## 2011-09-25 NOTE — Progress Notes (Signed)
09/25/11  1600 Pt. admitted to 5531 via w/c from ER; pt. with L breast swollen, discolored-redden and warm to touch, and tender.  Alert and oriented x3.  Shortly after pt. on unit- progress notes noted with ? Shingles on L flank area; area checked and noted darken raised areas with reddness, and opened.. CN Shinita and I tried to explain to pt. That he need to go in an isolation room for ? shingles and explained to pt. about shingles;airborne/contact isolation.  Called Dr. Jerral Ralph to talk with pt., and finally agreed to move.   Leandrew Koyanagi Antoine Vandermeulen,RN

## 2011-09-25 NOTE — Consult Note (Signed)
ANTIBIOTIC CONSULT NOTE - INITIAL  Pharmacy Consult for Vancomycin and Zosyn Indication: Breast Cellulitis   Hospital Problems: Principal Problem:  *Cellulitis of breast of male Active Problems:  HTN (hypertension)  DM (diabetes mellitus)  Leukopenia  ETOH abuse  Cocaine abuse  Tobacco abuse  Neuropathy   Allergies: No Known Allergies  Patient Measurements: Height: 5\' 8"  (172.7 cm) Weight: 186 lb 11.2 oz (84.687 kg) IBW/kg (Calculated) : 68.4   Vital Signs: BP 139/97  Pulse 89  Temp 97.6 F (36.4 C) (Oral)  Resp 18  Ht 5\' 8"  (1.727 m)  Wt 186 lb 11.2 oz (84.687 kg)  BMI 28.39 kg/m2  SpO2 100%  Labs:  Basename 09/25/11 1252  WBC 2.9*  HGB 15.3  PLT 210  LABCREA --  CREATININE 1.05   Estimated Creatinine Clearance: 91.1 ml/min (by C-G formula based on Cr of 1.05).   Microbiology: No results found for this or any previous visit (from the past 720 hour(s)).  Medical/Surgical History: Diagnosis  . Diabetes mellitus  . Hypertension  . Substance abuse  . Peripheral neuropathy  . Hypercholesterolemia   S/p 10 days of Keflex therapy [started 09/03/11] for same cellulitis infection   History reviewed. No pertinent past surgical history.  Medications:  Prescriptions prior to admission  Medication Sig Dispense Refill  . aspirin EC 325 MG tablet Take 325 mg by mouth daily.      [name] gabapentin (NEURONTIN) 400 MG capsule Take 800 mg by mouth every 12 (twelve) hours.      [name] lisinopril (PRINIVIL,ZESTRIL) 10 MG tablet Take 10 mg by mouth daily.      . metFORMIN (GLUCOPHAGE) 1000 MG tablet Take 1,000 mg by mouth daily with breakfast.       Scheduled:    . docusate sodium  100 mg Oral BID  . enoxaparin (LOVENOX) injection  40 mg Subcutaneous Q24H  . folic acid  1 mg Oral Daily  . gabapentin  300 mg Oral TID  . insulin aspart  0-9 Units Subcutaneous TID WC  . lisinopril  10 mg Oral Daily  .  morphine injection  4 mg Intravenous Once  . multivitamin with minerals   1 tablet Oral Daily  . ondansetron (ZOFRAN) IV  4 mg Intravenous Once  . sodium chloride  1,000 mL Intravenous Once  . thiamine  100 mg Oral Daily   Or  . thiamine  100 mg Intravenous Daily    Assessment:  Patient is a 46 year old African American male with a history of diabetes, hypertension, alcohol abuse, cocaine and tobacco abuse admitted with breast cellulitis.  He received 10 days of oral antibiotics starting on 09/03/11.  Breast abscess noticed to produce purulent discharge.    Recent mammography negative for malignancy.  Patient is admitted for IV antibiotics and scheduled I & D of abscess 09/26/11.  Patient has received first dose of Vancomycin in the ED, Zosyn has not been started.  Goal of Therapy:   Vancomycin trough level 10-15 mcg/ml  Plan:  Begin Zosyn 3.375 gm IV q 8 hours, each dose to infuse over 4 hours   Continue Vancomycin 1 gm IV q 12 hours.  Measure antibiotic drug levels at steady state as indicated.  Follow up culture results  Laurena Bering, Pharm.D. 8/29/20135:29 PM

## 2011-09-25 NOTE — ED Provider Notes (Signed)
Pt to CDU while awaits surgery consult on nipple abscess.  IV abx starts, labs ordered  2:14 PM Surgery has seen and evaluated pt and request [facility] for admission.  Temp orders placed.    Fayrene Helper, PA-C 09/25/11 1415

## 2011-09-25 NOTE — ED Notes (Signed)
Pt has a reddened are to the left nipple area. Pt states he has lots of drainage to the area and it is sore tot ouch

## 2011-09-25 NOTE — ED Provider Notes (Signed)
History  Scribed for Ward Givens, MD, the patient was seen in room TR07C/TR07C. This chart was scribed by Candelaria Stagers. The patient's care started at 12:31 PM   CSN: [id number]  Arrival date & time 09/25/11  1028   First MD Initiated Contact with Patient 09/25/11 1225      Chief Complaint  Patient presents with  . Abscess     The history is provided by the patient. No language interpreter was used.   Eric Morton is a 46 y.o. male who presents to the Emergency Department complaining of an abscess to the left breast that started about three months ago.  Pt was seen by his PCP at [facility] and was referred to the cancer center for possible breast cancer and states he had a normal mammogram.  He was told to stop taking Lipitor after being seen at the cancer center.  They felt the lipitor had changed the fatty tissue in his breast. Pt was seen in the ED for the mass about two weeks ago because the mass became bigger.  He was prescribed an antibiotic and states that there has been no improvement.  He reports that he began experiencing drainage from the mass about 7 days ago and again last night along with purple coloration of the nipple that started in the past couple of days.  He reports chills without known fever.  He has nausea without vomiting.  Pt also has a rash to the right abdomen that started 3 days ago.  He states he has h/o shingles, but this rash looks different.    Pt has h/o diabetes and reports elevated blood sugar levels with today's reading of 272. He reports he was advised at his last physician visit he needed to be on insulin, however he didn't want to do injections.  Pt also has h/o neuropathy, and HTN.  Pt smokes and drinks.  He currently does not work.    [facility]  Past Medical History  Diagnosis Date  . Hypertension   . Substance abuse   . Peripheral neuropathy   . Hypercholesterolemia   . Type II diabetes mellitus   . Alcohol  liver damage     "just a little damage"  . Chronic kidney disease     "jsut a little damage"  . Anxiety   . History of stomach ulcers     Past Surgical History  Procedure Date  . No past surgeries     History reviewed. No pertinent family history.  History  Substance Use Topics  . Smoking status: Current Everyday Smoker -- 0.5 packs/day for 32 years    Types: Cigarettes  . Smokeless tobacco: Never Used  . Alcohol Use: 42.0 oz/week    70 Cans of beer per week     at least 120 oz of beer daily  unemployed   Review of Systems  Constitutional: Positive for chills.  Gastrointestinal: Negative for nausea and vomiting.  Musculoskeletal:       Abscess to the left breast with drainage.   Skin: Positive for rash (rash to right lateral abdomen).  All other systems reviewed and are negative.    Allergies  Review of patient's allergies indicates no known allergies.  Home Medications   Current Outpatient Rx  Name Route Sig Dispense Refill  . ASPIRIN EC 325 MG PO TBEC Oral Take 325 mg by mouth daily.    [name] GABAPENTIN 400 MG PO CAPS Oral Take 800 mg by mouth  every 12 (twelve) hours.    [name] LISINOPRIL 10 MG PO TABS Oral Take 10 mg by mouth daily.    [name] METFORMIN HCL 1000 MG PO TABS Oral Take 1,000 mg by mouth daily with breakfast.      BP 125/76  Pulse 78  Temp 98.2 F (36.8 C) (Oral)  Resp 16  SpO2 98%  Vital signs normal    Physical Exam  Nursing note and vitals reviewed. Constitutional: He is oriented to person, place, and time. He appears well-developed and well-nourished. No distress.  HENT:  Head: Normocephalic and atraumatic.  Right Ear: External ear normal.  Left Ear: External ear normal.       Tongue dry.   Eyes: Conjunctivae and EOM are normal. Pupils are equal, round, and reactive to light.  Neck: Normal range of motion. Neck supple.  Pulmonary/Chest: Effort normal. No respiratory distress. He exhibits tenderness.       8x10 cm area of induration in the  subcutaneous tissue surrounding left nipple with red/purple discoloration about 6 cm in size also surrounding the left nipple.  The left breast is noticeable larger than his right.    Musculoskeletal:       8x10 cm area of induration surrounding left nipple with red/purple discoloration about 6 cm in size.    Neurological: He is alert and oriented to person, place, and time.  Skin: He is not diaphoretic.       Reddened area of skin with a cluster of purple blisters  on right lateral abdomen consistent with Shingles.  Linear area of redness just to right and inferior to umbilicus, which may be another area of emergingshingles.  No other rash seen on his back/abdomen/chest.   Psychiatric: He has a normal mood and affect. His behavior is normal.    ED Course  Procedures    Medications  vancomycin (VANCOCIN) IVPB 1000 mg/200 mL premix (not administered)  sodium chloride 0.9 % bolus 1,000 mL (1000 mL Intravenous Given 09/25/11 1307)  morphine 4 MG/ML injection 4 mg (4 mg Intravenous Given 09/25/11 1307)  ondansetron (ZOFRAN) injection 4 mg (4 mg Intravenous Given 09/25/11 1307)  vancomycin (VANCOCIN) IVPB 1000 mg/200 mL premix (1000 mg Intravenous Given 09/25/11 1402)     DIAGNOSTIC STUDIES: Oxygen Saturation is 98% on room air, normal by my interpretation.    COORDINATION OF CARE:  12:47 Report given to Eric Helper, PA in CDU  13:05 Eric Morton, Surgery PA made aware of consultation needed.  10:50 Ordered: POCT CBG monitoring   Results for orders placed during the hospital encounter of 09/25/11  GLUCOSE, CAPILLARY      Component Value Range   Glucose-Capillary 272 (*) 70 - 99 mg/dL   Comment 1 Documented in Chart     Comment 2 Notify RN    CBC WITH DIFFERENTIAL      Component Value Range   WBC 2.9 (*) 4.0 - 10.5 K/uL   RBC 5.40  4.22 - 5.81 MIL/uL   Hemoglobin 15.3  13.0 - 17.0 g/dL   HCT 96.0  45.4 - 09.8 %   MCV 84.8  78.0 - 100.0 fL   MCH 28.3  26.0 - 34.0 pg   MCHC 33.4   30.0 - 36.0 g/dL   RDW 11.9  14.7 - 82.9 %   Platelets 210  150 - 400 K/uL   Neutrophils Relative 28 (*) 43 - 77 %   Lymphocytes Relative 53 (*) 12 - 46 %   Monocytes Relative 12  3 - 12 %   Eosinophils Relative 6 (*) 0 - 5 %   Basophils Relative 1  0 - 1 %   Neutro Abs 0.8 (*) 1.7 - 7.7 K/uL   Lymphs Abs 1.6  0.7 - 4.0 K/uL   Monocytes Absolute 0.3  0.1 - 1.0 K/uL   Eosinophils Absolute 0.2  0.0 - 0.7 K/uL   Basophils Absolute 0.0  0.0 - 0.1 K/uL   WBC Morphology ATYPICAL LYMPHOCYTES    COMPREHENSIVE METABOLIC PANEL      Component Value Range   Sodium 127 (*) 135 - 145 mEq/L   Potassium 4.0  3.5 - 5.1 mEq/L   Chloride 93 (*) 96 - 112 mEq/L   CO2 21  19 - 32 mEq/L   Glucose, Bld 312 (*) 70 - 99 mg/dL   BUN 6  6 - 23 mg/dL   Creatinine, Ser 7.82  0.50 - 1.35 mg/dL   Calcium 9.6  8.4 - 95.6 mg/dL   Total Protein 6.9  6.0 - 8.3 g/dL   Albumin 2.8 (*) 3.5 - 5.2 g/dL   AST 15  0 - 37 U/L   ALT 13  0 - 53 U/L   Alkaline Phosphatase 97  39 - 117 U/L   Total Bilirubin 0.2 (*) 0.3 - 1.2 mg/dL   GFR calc non Af Amer 82 (*) >90 mL/min   GFR calc Af Amer >90  >90 mL/min  CBC      Component Value Range   WBC 2.7 (*) 4.0 - 10.5 K/uL   RBC 5.59  4.22 - 5.81 MIL/uL   Hemoglobin 16.2  13.0 - 17.0 g/dL   HCT 21.3  08.6 - 57.8 %   MCV 84.1  78.0 - 100.0 fL   MCH 29.0  26.0 - 34.0 pg   MCHC 34.5  30.0 - 36.0 g/dL   RDW 46.9  62.9 - 52.8 %   Platelets 229  150 - 400 K/uL  CREATININE, SERUM      Component Value Range   Creatinine, Ser 0.86  0.50 - 1.35 mg/dL   GFR calc non Af Amer >90  >90 mL/min   GFR calc Af Amer >90  >90 mL/min  GLUCOSE, CAPILLARY      Component Value Range   Glucose-Capillary 240 (*) 70 - 99 mg/dL   Comment 1 Documented in Chart     Comment 2 Notify RN     Laboratory interpretation all normal except low WBC, hyperglycemia, hyponatremia, low chloride   1. Abscess of breast, left   2. Hyperglycemia   3. Shingles   4. Cocaine abuse   5. DM (diabetes mellitus)    6. ETOH abuse    Plan admission  Devoria Albe, MD, FACEP   MDM    I personally performed the services described in this documentation, which was scribed in my presence. The recorded information has been reviewed and considered.  Devoria Albe, MD, Armando Gang        Ward Givens, MD 09/25/11 2015

## 2011-09-25 NOTE — ED Notes (Signed)
Pt with abscess to left breast, reports has not gone away and he has noticed drainage from area. Denies fevers. Area has scabbing. Pt also reports bug bite to right back wall.

## 2011-09-25 NOTE — Progress Notes (Signed)
Initial review for inpatient status is complete. 

## 2011-09-25 NOTE — H&P (Signed)
PATIENT DETAILS Name: Eric Morton Age: 46 y.o. Sex: male Date of Birth: Dec 27, 1965 Admit Date: 09/25/2011 ZOX:[id number],[id number], MD   CHIEF COMPLAINT:  Left breast pain and swelling for 3 weeks  HPI: Patient is a 46 year old African American male with a history of diabetes, hypertension, alcohol abuse, cocaine and tobacco abuse who claims to have been diagnosed with left sided gynecomastia around 3 months ago, presents to the emergency room today for evaluation of the above noted complaints. Around 3 weeks ago, he claims the mastoid becoming painful and started increasing in size. He claims he always has a retracted left nipple. He apparently came to the ED in the first week of August for evaluation of his left breast and apparently was as discharged antibiotics, which he claims he took is instructed. He claims that over the past few days he has noticed purulent discharge from his left breast area, he claims that the left breast is still indurated swollen. Because of this he presented to the ED today for further evaluation. He was found to be leukopenic and hyponatremic, a surgical consult was obtained, and the [facility] hospital[facility] was asked to admit this patient for further evaluation and treatment. Per patient he is had a mammography of the left base area done recently which did not show any malignancy. He denies any fever, he denies any headache, he denies any chest pain or shortness of breath. He denies any nausea vomiting or diarrhea.   ALLERGIES:  No Known Allergies  PAST MEDICAL HISTORY: Past Medical History  Diagnosis Date  . Diabetes mellitus   . Hypertension   . Substance abuse   . Peripheral neuropathy   . Hypercholesterolemia     PAST SURGICAL HISTORY: History reviewed. No pertinent past surgical history.  MEDICATIONS AT HOME: Prior to Admission medications   Medication Sig Start Date End Date Taking? Authorizing Provider  aspirin EC 325 MG tablet Take 325 mg by mouth  daily.   Yes Historical Provider, MD  gabapentin (NEURONTIN) 400 MG capsule Take 800 mg by mouth every 12 (twelve) hours.   Yes Historical Provider, MD  lisinopril (PRINIVIL,ZESTRIL) 10 MG tablet Take 10 mg by mouth daily.   Yes Historical Provider, MD  metFORMIN (GLUCOPHAGE) 1000 MG tablet Take 1,000 mg by mouth daily with breakfast.   Yes Historical Provider, MD    FAMILY HISTORY: He is unaware of any family history of breast cancer.  SOCIAL HISTORY:  He claims to smoke around 10 cigarettes a day. He also claims to drink around three 40 ounce bottles of miller light daily. He claims to use cocaine and marijuana regularly, he claims he either smokes or snorts cocaine and denies any intravenous drug use.  REVIEW OF SYSTEMS:  Constitutional:   No  weight loss, night sweats,  Fevers, chills, fatigue.  HEENT:    No headaches, Difficulty swallowing,Tooth/dental problems,Sore throat,  No sneezing, itching, ear ache, nasal congestion, post nasal drip,   Cardio-vascular: No chest pain,  Orthopnea, PND, swelling in lower extremities, anasarca,  dizziness, palpitations  GI:  No heartburn, indigestion, abdominal pain, nausea, vomiting, diarrhea, change in bowel habits, loss of appetite  Resp: No shortness of breath with exertion or at rest.  No excess mucus, no productive cough, No non-productive cough,  No coughing up of blood.No change in color of mucus.No wheezing.No chest wall deformity  Skin:  no rash or lesions.  GU:  no dysuria, change in color of urine, no urgency or frequency.  No flank pain.  Musculoskeletal:  No joint pain or swelling.  No decreased range of motion.  No back pain.  Psych: No change in mood or affect. No depression or anxiety.  No memory loss.   PHYSICAL EXAM: Blood pressure 163/105, pulse 87, temperature 97.5 F (36.4 C), temperature source Oral, resp. rate 18, SpO2 98.00%.  General appearance :Awake, alert, not in any distress. Speech Clear. Not toxic  Looking HEENT: Atraumatic and Normocephalic, pupils equally reactive to light and accomodation Neck: supple, no JVD. No cervical lymphadenopathy.  Resp:Good air entry bilaterally, no added sounds  Chest: 7x7cm of erythema on left breast involving the nipple, and medial, inferior portion of the breast. His nipple is inverted (says it always has been). Some SEROUS drainage is noted. No purulent drainage was able to be expressed. No peau d'orange. This is tender to touch. Right breast with no masses  CVS: S1 S2 regular, no murmurs.  Abdomen: Bowel sounds present, Non tender and not distended with no gaurding, rigidity or rebound. Extremities: B/L Lower Ext shows no edema, both legs are warm to touch, with  dorsalis pedis pulses palpable. Neurology: Awake alert, and oriented X 3, CN II-XII intact, Non focal Skin:No Rash Wounds:N/A  LABS ON ADMISSION:   Basename 09/25/11 1252  NA 127*  K 4.0  CL 93*  CO2 21  GLUCOSE 312*  BUN 6  CREATININE 1.05  CALCIUM 9.6  MG --  PHOS --    Basename 09/25/11 1252  AST 15  ALT 13  ALKPHOS 97  BILITOT 0.2*  PROT 6.9  ALBUMIN 2.8*   No results found for this basename: LIPASE:2,AMYLASE:2 in the last 72 hours  Basename 09/25/11 1252  WBC 2.9*  NEUTROABS 0.8*  HGB 15.3  HCT 45.8  MCV 84.8  PLT 210   No results found for this basename: CKTOTAL:3,CKMB:3,CKMBINDEX:3,TROPONINI:3 in the last 72 hours No results found for this basename: DDIMER:2 in the last 72 hours No components found with this basename: POCBNP:3   RADIOLOGIC STUDIES ON ADMISSION: No results found.  ASSESSMENT AND PLAN: Present on Admission:  .Cellulitis of breast of Right -We'll start on empiric vancomycin and Zosyn, await formal surgical consult in regards to whether further imaging is needed in terms of the CT scan or not. Would also defer to surgery whether or not I&D is needed as well.  -We'll keep patient n.p.o. to formal surgical evaluation is complete.  Marland KitchenHTN  (hypertension) -We'll resume his usual dosing of lisinopril and monitor his blood pressure trend.   .DM (diabetes mellitus) -Hold metformin while inpatient, place on sliding scale insulin for now   .Leukopenia -Given his history of polysubstance use, we will check HIV test.   .ETOH abuse -He does not have any signs of alcohol withdrawal at present, however will place him on Ativan per CIWA protocol. He was also be placed on multivitamins thiamine and folate.   .Cocaine abuse -He has been counseled extensively by me regarding refraining from further cocaine use. He claims understanding.  As needed Ativan.   .Tobacco abuse -Has been counseled extensively by me and claims understanding.   .Neuropathy -Patient claims that this is secondary to diabetes, will continue with Neurontin.   .? Shingles in the right lateral abdominal area -The patient he noticed this on Monday and has been relatively stable in size since then, he probably has already been more than 72 hours since the onset of this lesion, for now we will hold off on starting him on any antivirals and monitor his clinical course.  We'll place on contact isolation.  Further plan will depend as patient's clinical course evolves and further radiologic and laboratory data become available. Patient will be monitored closely.  DVT Prophylaxis: -Prophylactic Lovenox  Code Status: -Full Code  Total time spent for admission equals 45 minutes.  [facility] Pager [phone]  If 7PM-7AM, please contact night-coverage [email or web address] Password [facility] 09/25/2011, 3:37 PM

## 2011-09-25 NOTE — Consult Note (Signed)
Examined the patient.  He likely has an abscess under his nipple.  I recommend that he undergo incision and drainage under anesthesia tomorrow.  The surgical procedure has been discussed with the patient.  Potential risks, benefits, alternative treatments, and expected outcomes have been explained.  All of the patient's questions at this time have been answered.  The likelihood of reaching the patient's treatment goal is good.  The patient understand the proposed surgical procedure and wishes to proceed.   Wilmon Arms. [name], MD, [facility]  09/25/2011 5:04 PM

## 2011-09-25 NOTE — ED Provider Notes (Signed)
See CDU notes, pt seen by me and followed by PA in CDU  Ward Givens, MD 09/25/11 2013
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
## 2011-09-25 NOTE — Progress Notes (Signed)
09/25/11 1830  Pt. Transferred to 5507- airborne and contact room.  Leandrew Koyanagi Ishanvi Mcquitty,RN

## 2011-09-25 NOTE — Progress Notes (Signed)
Disposition Note  Eric Morton, is a 46 y.o. male,   MRN: [id number]  -  DOB - 1965-08-26  Outpatient Primary MD for the patient is DEFAULT,PROVIDER, MD   Blood pressure 125/76, pulse 78, temperature 98.2 F (36.8 C), temperature source Oral, resp. rate 16, SpO2 98.00%.  Active Problems: Recurrent Left Breast Infection Diabetes Mellitus Hypertension   46 yo male with DM, HTN and no PCP presents to ED with pain and drainage in his left breast.  He has had a recurrent breast infection since April 2013.  He has had a mammogram that did not show a mass, but has not had a biopsy of this tissue.  There is concern for inflammatory breast cancer.  In the ED the patient is found to be hyponatremic (128), hyper glycemic (270), with a low wbc count and low neutrophils.  He is a known cocaine and ETOH abuser and reports using cocaine very recently.    I have requested admission - IV antibiotics (vanc and zosyn) and a med surg bed.  Surgery has seen the patient in the ED and will consult.  They have requested he be left NPO for now.    Algis Downs, PA-C [facility] Pager: [phone]

## 2011-09-26 ENCOUNTER — Encounter (HOSPITAL_COMMUNITY)
Admission: EM | Disposition: A | Discharge status: Home Health | Payer: Self-pay | Source: Home / Self Care | Attending: Internal Medicine

## 2011-09-26 ENCOUNTER — Encounter (HOSPITAL_COMMUNITY): Payer: Self-pay | Admitting: Anesthesiology

## 2011-09-26 ENCOUNTER — Inpatient Hospital Stay (HOSPITAL_COMMUNITY): Payer: MEDICAID | Admitting: Anesthesiology

## 2011-09-26 HISTORY — PX: IRRIGATION AND DEBRIDEMENT ABSCESS: SHX5252

## 2011-09-26 LAB — BASIC METABOLIC PANEL
BUN: 7 mg/dL (ref 6–23)
Calcium: 9.3 mg/dL (ref 8.4–10.5)
GFR calc Af Amer: >90 mL/min (ref >90)
GFR calc non Af Amer: >90 mL/min (ref >90)
Glucose, Bld: 174 mg/dL — ABNORMAL HIGH (ref 70–99)
Sodium: 136 mEq/L (ref 135–145)

## 2011-09-26 LAB — GLUCOSE, CAPILLARY
Glucose-Capillary: 144 mg/dL — ABNORMAL HIGH (ref 70–99)
Glucose-Capillary: 275 mg/dL — ABNORMAL HIGH (ref 70–99)
Glucose-Capillary: 175 mg/dL — ABNORMAL HIGH (ref 70–99)

## 2011-09-26 LAB — CBC
MCH: 28.6 pg (ref 26.0–34.0)
MCHC: 33.5 g/dL (ref 30.0–36.0)
Platelets: 215 10*3/uL (ref 150–400)

## 2011-09-26 LAB — HIV ANTIBODY (ROUTINE TESTING W REFLEX): HIV: NONREACTIVE

## 2011-09-26 LAB — HEMOGLOBIN A1C: Hgb A1c MFr Bld: 10.1 % — ABNORMAL HIGH (ref <5.7)

## 2011-09-26 SURGERY — IRRIGATION AND DEBRIDEMENT ABSCESS
Anesthesia: General | Site: Breast | Laterality: Left | Wound class: Dirty or Infected

## 2011-09-26 MED ORDER — LIDOCAINE HCL (CARDIAC) 20 MG/ML IV SOLN
INTRAVENOUS | Status: DC | PRN
  Administered 2011-09-26: 70 mg via INTRAVENOUS

## 2011-09-26 MED ORDER — HYDRALAZINE HCL 20 MG/ML IJ SOLN
10.0000 mg | Freq: Four times a day (QID) | INTRAMUSCULAR | Status: DC | PRN
  Administered 2011-09-27: 10 mg via INTRAVENOUS
  Filled 2011-09-26 (×3): qty 0.5

## 2011-09-26 MED ORDER — FENTANYL CITRATE 0.05 MG/ML IJ SOLN
INTRAMUSCULAR | Status: DC | PRN
  Administered 2011-09-26 (×2): 50 ug via INTRAVENOUS

## 2011-09-26 MED ORDER — LACTATED RINGERS IV SOLN
INTRAVENOUS | Status: DC | PRN
  Administered 2011-09-26: 10:00:00 via INTRAVENOUS

## 2011-09-26 MED ORDER — HYDROMORPHONE HCL PF 1 MG/ML IJ SOLN: 0.2500 mg | INTRAMUSCULAR | Status: DC | PRN

## 2011-09-26 MED ORDER — 0.9 % SODIUM CHLORIDE (POUR BTL) OPTIME
TOPICAL | Status: DC | PRN
  Administered 2011-09-26: 1000 mL

## 2011-09-26 MED ORDER — BUPIVACAINE-EPINEPHRINE PF 0.25-1:200000 % IJ SOLN
INTRAMUSCULAR | Status: AC
  Filled 2011-09-26: qty 30

## 2011-09-26 MED ORDER — LISINOPRIL 20 MG PO TABS
20.0000 mg | ORAL_TABLET | Freq: Every day | ORAL | Status: DC
  Administered 2011-09-26 – 2011-09-27 (×2): 20 mg via ORAL
  Filled 2011-09-26 (×3): qty 1

## 2011-09-26 MED ORDER — MIDAZOLAM HCL 5 MG/5ML IJ SOLN
INTRAMUSCULAR | Status: DC | PRN
  Administered 2011-09-26: 2 mg via INTRAVENOUS

## 2011-09-26 MED ORDER — BUPIVACAINE-EPINEPHRINE 0.25% -1:200000 IJ SOLN
INTRAMUSCULAR | Status: DC | PRN
  Administered 2011-09-26: 10 mL

## 2011-09-26 MED ORDER — PROPOFOL 10 MG/ML IV EMUL
INTRAVENOUS | Status: DC | PRN
  Administered 2011-09-26: 200 mg via INTRAVENOUS

## 2011-09-26 MED ORDER — ENOXAPARIN SODIUM 40 MG/0.4ML ~~LOC~~ SOLN
40.0000 mg | SUBCUTANEOUS | Status: DC
  Administered 2011-09-26 – 2011-09-28 (×2): 40 mg via SUBCUTANEOUS
  Filled 2011-09-26 (×3): qty 0.4

## 2011-09-26 SURGICAL SUPPLY — 31 items
BLADE SURG 10 STRL SS (BLADE) ×2 IMPLANT
BLADE SURG ROTATE 9660 (MISCELLANEOUS) IMPLANT
CANISTER SUCTION 2500CC (MISCELLANEOUS) ×2 IMPLANT
CLOTH BEACON ORANGE TIMEOUT ST (SAFETY) ×2 IMPLANT
COVER SURGICAL LIGHT HANDLE (MISCELLANEOUS) ×2 IMPLANT
DRAPE LAPAROTOMY TRNSV 102X78 (DRAPE) ×2 IMPLANT
DRAPE UTILITY 15X26 W/TAPE STR (DRAPE) ×4 IMPLANT
ELECT CAUTERY BLADE 6.4 (BLADE) ×2 IMPLANT
ELECT REM PT RETURN 9FT ADLT (ELECTROSURGICAL) ×2
ELECTRODE REM PT RTRN 9FT ADLT (ELECTROSURGICAL) ×1 IMPLANT
GLOVE BIO SURGEON STRL SZ7 (GLOVE) ×2 IMPLANT
GOWN STRL NON-REIN LRG LVL3 (GOWN DISPOSABLE) ×4 IMPLANT
KIT BASIN OR (CUSTOM PROCEDURE TRAY) ×2 IMPLANT
KIT ROOM TURNOVER OR (KITS) ×2 IMPLANT
NEEDLE HYPO 25GX1X1/2 BEV (NEEDLE) ×2 IMPLANT
NS IRRIG 1000ML POUR BTL (IV SOLUTION) ×2 IMPLANT
PACK SURGICAL SETUP 50X90 (CUSTOM PROCEDURE TRAY) ×2 IMPLANT
PAD ARMBOARD 7.5X6 YLW CONV (MISCELLANEOUS) ×4 IMPLANT
PENCIL BUTTON HOLSTER BLD 10FT (ELECTRODE) ×2 IMPLANT
SPONGE GAUZE 4X4 12PLY (GAUZE/BANDAGES/DRESSINGS) ×4 IMPLANT
SPONGE LAP 18X18 X RAY DECT (DISPOSABLE) ×2 IMPLANT
SWAB COLLECTION DEVICE MRSA (MISCELLANEOUS) ×4 IMPLANT
SYR BULB IRRIGATION 50ML (SYRINGE) IMPLANT
SYR CONTROL 10ML LL (SYRINGE) ×2 IMPLANT
TAPE CLOTH SURG 4X10 WHT LF (GAUZE/BANDAGES/DRESSINGS) ×2 IMPLANT
TOWEL OR 17X24 6PK STRL BLUE (TOWEL DISPOSABLE) ×2 IMPLANT
TOWEL OR 17X26 10 PK STRL BLUE (TOWEL DISPOSABLE) ×2 IMPLANT
TUBE ANAEROBIC SPECIMEN COL (MISCELLANEOUS) ×4 IMPLANT
TUBE CONNECTING 12X1/4 (SUCTIONS) ×2 IMPLANT
WATER STERILE IRR 1000ML POUR (IV SOLUTION) IMPLANT
YANKAUER SUCT BULB TIP NO VENT (SUCTIONS) ×2 IMPLANT

## 2011-09-26 NOTE — Progress Notes (Signed)
Agree.  OR today.  Eric Morton. [name], MD, [facility]  09/26/2011 11:08 AM

## 2011-09-26 NOTE — Progress Notes (Signed)
CARE MANAGEMENT NOTE 09/26/2011  Patient:  Eric Morton   Account Number:  [id number]  Date Initiated:  09/25/2011  Documentation initiated by:  Anette Guarneri  Subjective/Objective Assessment:   GI bleed     Action/Plan:   CM spoke with patient regarding home health needs at discharge. She is already established with [facility].   Anticipated DC Date:  09/27/2011   Anticipated DC Plan:  HOME W HOME HEALTH SERVICES      DC Planning Services  CM consult      [facility] Choice  HOME HEALTH   Choice offered to / List presented to:  C-1 Patient        HH arranged  HH-2 PT  HH-3 OT      [facility] agency  [facility]   Status of service:  Completed, signed off Medicare Important Message given?   (If response is "NO", the following Medicare IM given date fields will be blank) Date Medicare IM given:   Date Additional Medicare IM given:    Discharge Disposition:  HOME W HOME HEALTH SERVICES  Per UR Regulation:    If discussed at Long Length of Stay Meetings, dates discussed:    Comments:

## 2011-09-26 NOTE — Progress Notes (Signed)
PATIENT DETAILS Name: Eric Morton Age: 46 y.o. Sex: male Date of Birth: March 27, 1965 Admit Date: 09/25/2011 Admitting Physician Hollice Espy, MD YNW:[id number], MD  Subjective: No major issues overnight  Assessment/Plan: Principal Problem:  *Cellulitis and abscess of breast of male -c/w Vanco/Zosyn -for I&D today -await cultures  Active Problems:  HTN (hypertension) -moderate control -continue with Lisinopril   DM (diabetes mellitus) -CBG's stable -continue to hold Metformin -SSI while inpatient  Hyponatremia -2/2 to dehydration -resolved with hydration   Leukopenia -resolved -likely 2/2 to infection -HIV-neg   ETOH abuse -non-tremulous -no signs of withdrawal currently -Ativan per CIWA   Cocaine abuse -counseled extensively   Tobacco abuse -counseled extensively   Neuropathy -c/w Neurontin  ?vesicular eruption in right flank area -doubt Shingles-will d/c isolation  Disposition: Remain inpatient  DVT Prophylaxis: Prophylactic Lovenox   Code Status: Full code   Procedures:  I&D 8/30  CONSULTS:  general surgery  PHYSICAL EXAM: Vital signs in last 24 hours: Filed Vitals:   09/26/11 0114 09/26/11 0403 09/26/11 0900 09/26/11 1115  BP: 142/88 159/89 154/87 137/82  Pulse:  75 81 77  Temp:  98.2 F (36.8 C)    TempSrc:  Oral    Resp:  20  16  Height:      Weight:      SpO2:  99%  100%    Weight change:  Body mass index is 28.39 kg/(m^2).   Gen Exam: Awake and alert with clear speech.   Neck: Supple, No JVD.   Chest: B/L Clear.   CVS: S1 S2 Regular, no murmurs.  Abdomen: soft, BS +, non tender, non distended.  Extremities: no edema, lower extremities warm to touch. Neurologic: Non Focal.   Skin: No Rash.  Wounds: N/A.    Intake/Output from previous day:  Intake/Output Summary (Last 24 hours) at 09/26/11 1139 Last data filed at 09/26/11 0828  Gross per 24 hour  Intake 753.75 ml  Output      0 ml  Net 753.75 ml       LAB RESULTS: CBC  Lab 09/26/11 0500 09/25/11 1722 09/25/11 1252  WBC 4.6 2.7* 2.9*  HGB 15.9 16.2 15.3  HCT 47.4 47.0 45.8  PLT 215 229 210  MCV 85.4 84.1 84.8  MCH 28.6 29.0 28.3  MCHC 33.5 34.5 33.4  RDW 13.1 12.9 13.1  LYMPHSABS -- -- 1.6  MONOABS -- -- 0.3  EOSABS -- -- 0.2  BASOSABS -- -- 0.0  BANDABS -- -- --    Chemistries   Lab 09/26/11 0500 09/25/11 1722 09/25/11 1252  NA 136 -- 127*  K 4.5 -- 4.0  CL 99 -- 93*  CO2 27 -- 21  GLUCOSE 174* -- 312*  BUN 7 -- 6  CREATININE 0.90 0.86 1.05  CALCIUM 9.3 -- 9.6  MG -- -- --    CBG:  Lab 09/26/11 1131 09/25/11 1720 09/25/11 1057  GLUCAP 144* 240* 272*    GFR Estimated Creatinine Clearance: 106.3 ml/min (by C-G formula based on Cr of 0.9).  Coagulation profile No results found for this basename: INR:5,PROTIME:5 in the last 168 hours  Cardiac Enzymes No results found for this basename: CK:3,CKMB:3,TROPONINI:3,MYOGLOBIN:3 in the last 168 hours  No components found with this basename: POCBNP:3 No results found for this basename: DDIMER:2 in the last 72 hours  Basename 09/25/11 1722  HGBA1C 10.1*   No results found for this basename: CHOL:2,HDL:2,LDLCALC:2,TRIG:2,CHOLHDL:2,LDLDIRECT:2 in the last 72 hours No results found for this basename: TSH,T4TOTAL,FREET3,T3FREE,THYROIDAB in the  last 72 hours No results found for this basename: VITAMINB12:2,FOLATE:2,FERRITIN:2,TIBC:2,IRON:2,RETICCTPCT:2 in the last 72 hours No results found for this basename: LIPASE:2,AMYLASE:2 in the last 72 hours  Urine Studies No results found for this basename: UACOL:2,UAPR:2,USPG:2,UPH:2,UTP:2,UGL:2,UKET:2,UBIL:2,UHGB:2,UNIT:2,UROB:2,ULEU:2,UEPI:2,UWBC:2,URBC:2,UBAC:2,CAST:2,CRYS:2,UCOM:2,BILUA:2 in the last 72 hours  MICROBIOLOGY: Recent Results (from the past 240 hour(s))  WOUND CULTURE     Status: Normal (Preliminary result)   Collection Time   09/25/11  2:11 PM      Component Value Range Status Comment   Specimen  Description WOUND LEFT BREAST   Final    Special Requests NONE   Final    Gram Stain PENDING   Incomplete    Culture NO GROWTH 1 DAY   Final    Report Status PENDING   Incomplete   MRSA PCR SCREENING     Status: Normal   Collection Time   09/26/11  9:04 AM      Component Value Range Status Comment   MRSA by PCR NEGATIVE  NEGATIVE Final     RADIOLOGY STUDIES/RESULTS: No results found.  MEDICATIONS: Scheduled Meds:   . docusate sodium  100 mg Oral BID  . enoxaparin (LOVENOX) injection  40 mg Subcutaneous Q24H  . folic acid  1 mg Oral Daily  . gabapentin  300 mg Oral TID  . hydrALAZINE  5 mg Intravenous Once  . insulin aspart  0-9 Units Subcutaneous TID WC  . lisinopril  20 mg Oral Daily  .  morphine injection  4 mg Intravenous Once  . multivitamin with minerals  1 tablet Oral Daily  . ondansetron (ZOFRAN) IV  4 mg Intravenous Once  . piperacillin-tazobactam  3.375 g Intravenous Q8H  . sodium chloride  1,000 mL Intravenous Once  . thiamine  100 mg Oral Daily   Or  . thiamine  100 mg Intravenous Daily  . vancomycin  1,000 mg Intravenous Once  . vancomycin  1,000 mg Intravenous Q12H  . DISCONTD: sodium chloride   Intravenous STAT  . DISCONTD: enoxaparin (LOVENOX) injection  40 mg Subcutaneous Q24H  . DISCONTD: lisinopril  10 mg Oral Daily  . DISCONTD: piperacillin-tazobactam (ZOSYN)  IV  3.375 g Intravenous Once   Continuous Infusions:   . sodium chloride 75 mL/hr (09/25/11 1649)  . DISCONTD: sodium chloride 125 mL/hr at 09/25/11 1401   PRN Meds:.acetaminophen, acetaminophen, albuterol, hydrALAZINE, HYDROcodone-acetaminophen, HYDROmorphone (DILAUDID) injection, LORazepam, LORazepam, morphine injection, ondansetron (ZOFRAN) IV, ondansetron, DISCONTD: 0.9 % irrigation (POUR BTL), DISCONTD: bupivacaine-EPINEPHrine, DISCONTD:  HYDROmorphone (DILAUDID) injection, DISCONTD: ondansetron (ZOFRAN) IV  Antibiotics: Anti-infectives     Start     Dose/Rate Route Frequency Ordered Stop     09/26/11 0100   vancomycin (VANCOCIN) IVPB 1000 mg/200 mL premix        1,000 mg 200 mL/hr over 60 Minutes Intravenous Every 12 hours 09/25/11 1747     09/25/11 1800  piperacillin-tazobactam (ZOSYN) IVPB 3.375 g       3.375 g 12.5 mL/hr over 240 Minutes Intravenous Every 8 hours 09/25/11 1745     09/25/11 1415   piperacillin-tazobactam (ZOSYN) IVPB 3.375 g  Status:  Discontinued        3.375 g 12.5 mL/hr over 240 Minutes Intravenous  Once 09/25/11 1413 09/25/11 1619   09/25/11 1245   vancomycin (VANCOCIN) IVPB 1000 mg/200 mL premix        1,000 mg 200 mL/hr over 60 Minutes Intravenous  Once 09/25/11 1245 09/25/11 1502           Jeoffrey Massed, MD  [facility]  Pager:[phone]  If 7PM-7AM, please contact night-coverage [email or web address] Password [email or web address] 09/26/2011, 11:39 AM   LOS: 1 day

## 2011-09-26 NOTE — Anesthesia Postprocedure Evaluation (Signed)
  Anesthesia Post-op Note  Patient: Eric Morton  Procedure(s) Performed: Procedure(s) (LRB): IRRIGATION AND DEBRIDEMENT ABSCESS (Left)  Patient Location: PACU  Anesthesia Type: General  Level of Consciousness: awake  Airway and Oxygen Therapy: Patient Spontanous Breathing  Post-op Pain: mild  Post-op Assessment: Post-op Vital signs reviewed  Post-op Vital Signs: Reviewed  Complications: No apparent anesthesia complications

## 2011-09-26 NOTE — Progress Notes (Signed)
Pt feels fine.  No issues.  Ready for OR today.  Being taken down now.  Taequan Stockhausen E 09/26/2011

## 2011-09-26 NOTE — Op Note (Signed)
Preop diagnosis: Left breast abscess Postop diagnosis: Same Procedure performed: Incision and drainage of left breast abscess Surgeon:Staci Carver K. Anesthesia: Gen. Via LMA Indications: This is a 46 year old male who presents with several weeks of an enlarging infection in his left breast below the nipple. This has become quite tender swollen. It is felt that he has an abscess underneath this area. He presents now for drainage.  Description of procedure: The patient was brought to the operating room placed in a supine position on operating room table. After an adequate level of general anesthesia was obtained, his left chest was prepped with chlor prep and draped in sterile fashion. A timeout was taken to ensure the proper patient proper procedure. We infiltrated the area around the abscess cavity with quarter percent Marcaine with epinephrine. Made a transverse incision measuring about 3 cm across below his nipple. We entered a large abscess cavity. Some purulent drainage was expressed. This was cultured and sent for microbiology. We open the wound widely. The abscess cavity measures about 3 cm in diameter and extends down almost to the chest wall. We irrigated this thoroughly with saline. Hemostasis was good. We packed the wound with saline moistened gauze. We will begin daily dressing changes. A dry dressing was applied. The patient was then extubated and brought to recovery in stable condition. All sponge, instrument, and needle counts are correct.  Wilmon Arms. [name], MD, [facility] Surgery  09/26/2011 11:02 AM

## 2011-09-26 NOTE — Preoperative (Signed)
Beta Blockers   Reason not to administer Beta Blockers:Not Applicable 

## 2011-09-26 NOTE — Transfer of Care (Signed)
Immediate Anesthesia Transfer of Care Note  Patient: Eric Morton  Procedure(s) Performed: Procedure(s) (LRB): IRRIGATION AND DEBRIDEMENT ABSCESS (Left)  Patient Location: PACU  Anesthesia Type: General  Level of Consciousness: awake, alert , oriented and patient cooperative  Airway & Oxygen Therapy: Patient Spontanous Breathing and Patient connected to face mask oxygen  Post-op Assessment: Report given to PACU RN, Post -op Vital signs reviewed and stable and Patient moving all extremities  Post vital signs: Reviewed and stable  Complications: No apparent anesthesia complications

## 2011-09-26 NOTE — Anesthesia Preprocedure Evaluation (Addendum)
Anesthesia Evaluation  Patient identified by MRN, date of birth, ID band  Reviewed: Allergy & Precautions, H&P , NPO status , Patient's Chart, lab work & pertinent test results  Airway Mallampati: II      Dental No notable dental hx.    Pulmonary neg pulmonary ROS,          Cardiovascular hypertension, Pt. on medications Rhythm:Regular Rate:Normal     Neuro/Psych Anxiety    GI/Hepatic Neg liver ROS,   Endo/Other  Type 2  Renal/GU Renal disease     Musculoskeletal   Abdominal   Peds  Hematology   Anesthesia Other Findings   Reproductive/Obstetrics                          Anesthesia Physical Anesthesia Plan  ASA: III  Anesthesia Plan: General   Post-op Pain Management:    Induction: Intravenous  Airway Management Planned: LMA  Additional Equipment:   Intra-op Plan:   Post-operative Plan: Extubation in OR  Informed Consent: I have reviewed the patients History and Physical, chart, labs and discussed the procedure including the risks, benefits and alternatives for the proposed anesthesia with the patient or authorized representative who has indicated his/her understanding and acceptance.   Dental advisory given  Plan Discussed with: CRNA, Anesthesiologist and Surgeon  Anesthesia Plan Comments:        Anesthesia Quick Evaluation

## 2011-09-26 NOTE — Progress Notes (Signed)
Clinical Social Worker attempted to meet with pt in regard to referral. Pt currently downstairs for surgery. Clinical Social Worker to follow up with psychosocial assessment. Clinical Social Worker to continue to follow.  Jacklynn Lewis, MSW, LCSWA  Clinical Social Work [phone]

## 2011-09-27 LAB — GLUCOSE, CAPILLARY
Glucose-Capillary: 158 mg/dL — ABNORMAL HIGH (ref 70–99)
Glucose-Capillary: 188 mg/dL — ABNORMAL HIGH (ref 70–99)
Glucose-Capillary: 287 mg/dL — ABNORMAL HIGH (ref 70–99)

## 2011-09-27 LAB — WOUND CULTURE: Culture: NO GROWTH

## 2011-09-27 MED ORDER — OXYCODONE-ACETAMINOPHEN 5-325 MG PO TABS
1.0000 | ORAL_TABLET | ORAL | Status: DC | PRN
  Administered 2011-09-27 – 2011-09-28 (×3): 1 via ORAL
  Administered 2011-09-28 (×2): 2 via ORAL
  Administered 2011-09-28: 1 via ORAL
  Filled 2011-09-27 (×2): qty 2
  Filled 2011-09-27 (×3): qty 1
  Filled 2011-09-27: qty 2

## 2011-09-27 NOTE — Progress Notes (Signed)
PATIENT DETAILS Name: Eric Morton Age: 46 y.o. Sex: male Date of Birth: 01/20/1966 Admit Date: 09/25/2011 Admitting Physician Hollice Espy, MD JXB:[id number], MD  Subjective: No major issues overnight  Assessment/Plan: Principal Problem:  *Cellulitis and abscess of breast of male -c/w Vanco/Zosyn - I&D done 8/30  -await cultures -wound care per Surgery  Active Problems:  HTN (hypertension) -moderate control -continue with Lisinopril   DM (diabetes mellitus) -CBG's stable -continue to hold Metformin -SSI while inpatient  Hyponatremia -2/2 to dehydration -resolved with hydration   Leukopenia -resolved -likely 2/2 to infection -HIV-neg   ETOH abuse -non-tremulous -no signs of withdrawal currently -Ativan per CIWA   Cocaine abuse -counseled extensively   Tobacco abuse -counseled extensively   Neuropathy -c/w Neurontin  ?vesicular eruption in right flank area -area essentially unchaged since admission -doubt Shingles-will d/c isolation  Disposition: Remain inpatient  DVT Prophylaxis: Prophylactic Lovenox   Code Status: Full code   Procedures:  I&D 8/30  CONSULTS:  general surgery  PHYSICAL EXAM: Vital signs in last 24 hours: Filed Vitals:   09/26/11 1400 09/26/11 2228 09/27/11 0404 09/27/11 0721  BP: 137/79 158/90 148/96 139/85  Pulse: 82 72 72 76  Temp:  98.2 F (36.8 C) 97.7 F (36.5 C) 98 F (36.7 C)  TempSrc:  Oral Oral Oral  Resp:  18 20 16   Height:      Weight:      SpO2:  100% 99% 100%    Weight change:  Body mass index is 28.39 kg/(m^2).   Gen Exam: Awake and alert with clear speech.   Neck: Supple, No JVD.   Chest: B/L Clear.   CVS: S1 S2 Regular, no murmurs.  Abdomen: soft, BS +, non tender, non distended.  Extremities: no edema, lower extremities warm to touch. Neurologic: Non Focal.   Skin: No Rash.  Wounds: N/A.    Intake/Output from previous day:  Intake/Output Summary (Last 24 hours) at  09/27/11 1145 Last data filed at 09/27/11 0600  Gross per 24 hour  Intake   1765 ml  Output     10 ml  Net   1755 ml     LAB RESULTS: CBC  Lab 09/26/11 0500 09/25/11 1722 09/25/11 1252  WBC 4.6 2.7* 2.9*  HGB 15.9 16.2 15.3  HCT 47.4 47.0 45.8  PLT 215 229 210  MCV 85.4 84.1 84.8  MCH 28.6 29.0 28.3  MCHC 33.5 34.5 33.4  RDW 13.1 12.9 13.1  LYMPHSABS -- -- 1.6  MONOABS -- -- 0.3  EOSABS -- -- 0.2  BASOSABS -- -- 0.0  BANDABS -- -- --    Chemistries   Lab 09/26/11 0500 09/25/11 1722 09/25/11 1252  NA 136 -- 127*  K 4.5 -- 4.0  CL 99 -- 93*  CO2 27 -- 21  GLUCOSE 174* -- 312*  BUN 7 -- 6  CREATININE 0.90 0.86 1.05  CALCIUM 9.3 -- 9.6  MG -- -- --    CBG:  Lab 09/27/11 0745 09/26/11 2143 09/26/11 1650 09/26/11 1209 09/26/11 1131  GLUCAP 158* 175* 275* 147* 144*    GFR Estimated Creatinine Clearance: 106.3 ml/min (by C-G formula based on Cr of 0.9).  Coagulation profile No results found for this basename: INR:5,PROTIME:5 in the last 168 hours  Cardiac Enzymes No results found for this basename: CK:3,CKMB:3,TROPONINI:3,MYOGLOBIN:3 in the last 168 hours  No components found with this basename: POCBNP:3 No results found for this basename: DDIMER:2 in the last 72 hours  Basename 09/25/11 1722  HGBA1C 10.1*   No results found for this basename: CHOL:2,HDL:2,LDLCALC:2,TRIG:2,CHOLHDL:2,LDLDIRECT:2 in the last 72 hours No results found for this basename: TSH,T4TOTAL,FREET3,T3FREE,THYROIDAB in the last 72 hours No results found for this basename: VITAMINB12:2,FOLATE:2,FERRITIN:2,TIBC:2,IRON:2,RETICCTPCT:2 in the last 72 hours No results found for this basename: LIPASE:2,AMYLASE:2 in the last 72 hours  Urine Studies No results found for this basename: UACOL:2,UAPR:2,USPG:2,UPH:2,UTP:2,UGL:2,UKET:2,UBIL:2,UHGB:2,UNIT:2,UROB:2,ULEU:2,UEPI:2,UWBC:2,URBC:2,UBAC:2,CAST:2,CRYS:2,UCOM:2,BILUA:2 in the last 72 hours  MICROBIOLOGY: Recent Results (from the past 240  hour(s))  WOUND CULTURE     Status: Normal   Collection Time   09/25/11  2:11 PM      Component Value Range Status Comment   Specimen Description WOUND LEFT BREAST   Final    Special Requests NONE   Final    Gram Stain     Final    Value: MODERATE WBC PRESENT, PREDOMINANTLY PMN     NO SQUAMOUS EPITHELIAL CELLS SEEN     NO ORGANISMS SEEN   Culture NO GROWTH 2 DAYS   Final    Report Status 09/27/2011 FINAL   Final   MRSA PCR SCREENING     Status: Normal   Collection Time   09/26/11  9:04 AM      Component Value Range Status Comment   MRSA by PCR NEGATIVE  NEGATIVE Final   CULTURE, ROUTINE-ABSCESS     Status: Normal (Preliminary result)   Collection Time   09/26/11 10:52 AM      Component Value Range Status Comment   Specimen Description ABSCESS LEFT BREAST   Final    Special Requests PT ON VANC ZOSYN   Final    Gram Stain PENDING   Incomplete    Culture NO GROWTH 1 DAY   Final    Report Status PENDING   Incomplete     RADIOLOGY STUDIES/RESULTS: No results found.  MEDICATIONS: Scheduled Meds:    . docusate sodium  100 mg Oral BID  . enoxaparin (LOVENOX) injection  40 mg Subcutaneous Q24H  . folic acid  1 mg Oral Daily  . gabapentin  300 mg Oral TID  . insulin aspart  0-9 Units Subcutaneous TID WC  . lisinopril  20 mg Oral Daily  . multivitamin with minerals  1 tablet Oral Daily  . piperacillin-tazobactam  3.375 g Intravenous Q8H  . thiamine  100 mg Oral Daily   Or  . thiamine  100 mg Intravenous Daily  . vancomycin  1,000 mg Intravenous Q12H   Continuous Infusions:    . DISCONTD: sodium chloride 75 mL/hr at 09/26/11 2358   PRN Meds:.acetaminophen, acetaminophen, albuterol, hydrALAZINE, HYDROmorphone (DILAUDID) injection, LORazepam, LORazepam, morphine injection, ondansetron (ZOFRAN) IV, ondansetron, oxyCODONE-acetaminophen, DISCONTD: HYDROcodone-acetaminophen  Antibiotics: Anti-infectives     Start     Dose/Rate Route Frequency Ordered Stop   09/26/11 0100    vancomycin (VANCOCIN) IVPB 1000 mg/200 mL premix        1,000 mg 200 mL/hr over 60 Minutes Intravenous Every 12 hours 09/25/11 1747     09/25/11 1800   piperacillin-tazobactam (ZOSYN) IVPB 3.375 g        3.375 g 12.5 mL/hr over 240 Minutes Intravenous Every 8 hours 09/25/11 1745     09/25/11 1415   piperacillin-tazobactam (ZOSYN) IVPB 3.375 g  Status:  Discontinued        3.375 g 12.5 mL/hr over 240 Minutes Intravenous  Once 09/25/11 1413 09/25/11 1619   09/25/11 1245   vancomycin (VANCOCIN) IVPB 1000 mg/200 mL premix        1,000 mg 200 mL/hr over  60 Minutes Intravenous  Once 09/25/11 1245 09/25/11 1502           Jeoffrey Massed, MD  [facility] Pager:[phone]  If 7PM-7AM, please contact night-coverage [email or web address] Password [email or web address] 09/27/2011, 11:45 AM   LOS: 2 days

## 2011-09-27 NOTE — Progress Notes (Signed)
Patient interviewed and examined. Agree with evaluation and treatment plan outlined by Ms. Osborne. Hope to have some culture data tomorrow. Will need antibiotics as outpatient.   Angelia Mould. Derrell Lolling, M.D., [facility] General and Minimally invasive Surgery Breast and Colorectal Surgery Office:   [phone] Pager:   [phone]

## 2011-09-27 NOTE — Plan of Care (Signed)
Problem: Phase II Progression Outcomes Goal: Other Phase II Outcomes/Goals Outcome: Completed/Met Date Met:  09/27/11 Discussed wound care and plan for family to come tomorrow for education.

## 2011-09-27 NOTE — Progress Notes (Signed)
Patient ID: Eric Morton, male   DOB: Jun 11, 1965, 46 y.o.   MRN: [id number] 1 Day Post-Op  Subjective: Pt c/o pain in back and on his side from vesicular type rash.  Some pain at breast site  Objective: Vital signs in last 24 hours: Temp:  [97.4 F (36.3 C)-98.4 F (36.9 C)] 98 F (36.7 C) (08/31 0721) Pulse Rate:  [72-82] 76  (08/31 0721) Resp:  [13-20] 16  (08/31 0721) BP: (137-158)/(79-96) 139/85 mmHg (08/31 0721) SpO2:  [97 %-100 %] 100 % (08/31 0721) Last BM Date: 09/24/11  Intake/Output from previous day: 08/30 0701 - 08/31 0700 In: 1765 [P.O.:340; I.V.:1425] Out: 10 [Blood:10] Intake/Output this shift:    PE: Breast: left breast with still quite a large area of induration.  Packing in place.  No purulent drainage.  Wound is clean.  Lab Results:   Basename 09/26/11 0500 09/25/11 1722  WBC 4.6 2.7*  HGB 15.9 16.2  HCT 47.4 47.0  PLT 215 229   BMET  Basename 09/26/11 0500 09/25/11 1722 09/25/11 1252  NA 136 -- 127*  K 4.5 -- 4.0  CL 99 -- 93*  CO2 27 -- 21  GLUCOSE 174* -- 312*  BUN 7 -- 6  CREATININE 0.90 0.86 --  CALCIUM 9.3 -- 9.6   PT/INR No results found for this basename: LABPROT:2,INR:2 in the last 72 hours CMP     Component Value Date/Time   NA 136 09/26/2011 0500   K 4.5 09/26/2011 0500   CL 99 09/26/2011 0500   CO2 27 09/26/2011 0500   GLUCOSE 174* 09/26/2011 0500   BUN 7 09/26/2011 0500   CREATININE 0.90 09/26/2011 0500   CALCIUM 9.3 09/26/2011 0500   PROT 6.9 09/25/2011 1252   ALBUMIN 2.8* 09/25/2011 1252   AST 15 09/25/2011 1252   ALT 13 09/25/2011 1252   ALKPHOS 97 09/25/2011 1252   BILITOT 0.2* 09/25/2011 1252   GFRNONAA >90 09/26/2011 0500   GFRAA >90 09/26/2011 0500   Lipase  No results found for this basename: lipase       Studies/Results: No results found.  Anti-infectives: Anti-infectives     Start     Dose/Rate Route Frequency Ordered Stop   09/26/11 0100   vancomycin (VANCOCIN) IVPB 1000 mg/200 mL premix        1,000 mg 200  mL/hr over 60 Minutes Intravenous Every 12 hours 09/25/11 1747     09/25/11 1800  piperacillin-tazobactam (ZOSYN) IVPB 3.375 g       3.375 g 12.5 mL/hr over 240 Minutes Intravenous Every 8 hours 09/25/11 1745     09/25/11 1415   piperacillin-tazobactam (ZOSYN) IVPB 3.375 g  Status:  Discontinued        3.375 g 12.5 mL/hr over 240 Minutes Intravenous  Once 09/25/11 1413 09/25/11 1619   09/25/11 1245   vancomycin (VANCOCIN) IVPB 1000 mg/200 mL premix        1,000 mg 200 mL/hr over 60 Minutes Intravenous  Once 09/25/11 1245 09/25/11 1502           Assessment/Plan  1. Left breast abscess, s/p I&D 2. Polysubstance abuse 3. HTN 4. DM  Plan: 1. Begin NS WD dressing changes today, once patient can tolerate these with oral pain medications, then he should be stable for dc home. 2. CXs are still pending 3. Will need to follow up with [place] in a couple of weeks.   LOS: 2 days    Trisha Morandi E 09/27/2011

## 2011-09-27 NOTE — Progress Notes (Signed)
Contact and Airborne precautions discontinued per Dr. Jerral Ralph , Shingles ruled out. [name] [facility]

## 2011-09-28 LAB — GLUCOSE, CAPILLARY
Glucose-Capillary: 174 mg/dL — ABNORMAL HIGH (ref 70–99)
Glucose-Capillary: 230 mg/dL — ABNORMAL HIGH (ref 70–99)

## 2011-09-28 MED ORDER — LISINOPRIL 40 MG PO TABS
40.0000 mg | ORAL_TABLET | Freq: Every day | ORAL | Status: DC
  Administered 2011-09-28: 40 mg via ORAL
  Filled 2011-09-28: qty 1

## 2011-09-28 MED ORDER — OXYCODONE-ACETAMINOPHEN 5-325 MG PO TABS: 1.0000 | ORAL_TABLET | Freq: Four times a day (QID) | ORAL | Status: DC | PRN

## 2011-09-28 MED ORDER — LISINOPRIL 10 MG PO TABS: 20.0000 mg | ORAL_TABLET | Freq: Every day | ORAL | Status: DC

## 2011-09-28 MED ORDER — DOXYCYCLINE HYCLATE 100 MG PO TABS: 100.0000 mg | ORAL_TABLET | Freq: Two times a day (BID) | ORAL | Status: AC

## 2011-09-28 MED ORDER — METFORMIN HCL 500 MG PO TABS
1000.0000 mg | ORAL_TABLET | Freq: Every day | ORAL | Status: DC
  Filled 2011-09-28: qty 2

## 2011-09-28 NOTE — Progress Notes (Signed)
Patient ID: Eric Morton, male   DOB: 01-06-1966, 46 y.o.   MRN: [id number] 2 Days Post-Op  Subjective: Pt feels ok.  Pain controlled.   Objective: Vital signs in last 24 hours: Temp:  [97.6 F (36.4 C)-98.5 F (36.9 C)] 97.6 F (36.4 C) (09/01 0459) Pulse Rate:  [71-75] 74  (09/01 0459) Resp:  [17] 17  (09/01 0459) BP: (154-214)/(87-110) 177/96 mmHg (09/01 0459) SpO2:  [93 %-99 %] 93 % (09/01 0459) Last BM Date: 09/24/11  Intake/Output from previous day: 08/31 0701 - 09/01 0700 In: 1639.8 [P.O.:1341; I.V.:298.8] Out: -  Intake/Output this shift:    PE: Breast: left breast wound is clean.  Induration is decreasing and feels softer today.  No further infection seen.  Lab Results:   Basename 09/26/11 0500 09/25/11 1722  WBC 4.6 2.7*  HGB 15.9 16.2  HCT 47.4 47.0  PLT 215 229   BMET  Basename 09/26/11 0500 09/25/11 1722 09/25/11 1252  NA 136 -- 127*  K 4.5 -- 4.0  CL 99 -- 93*  CO2 27 -- 21  GLUCOSE 174* -- 312*  BUN 7 -- 6  CREATININE 0.90 0.86 --  CALCIUM 9.3 -- 9.6   PT/INR No results found for this basename: LABPROT:2,INR:2 in the last 72 hours CMP     Component Value Date/Time   NA 136 09/26/2011 0500   K 4.5 09/26/2011 0500   CL 99 09/26/2011 0500   CO2 27 09/26/2011 0500   GLUCOSE 174* 09/26/2011 0500   BUN 7 09/26/2011 0500   CREATININE 0.90 09/26/2011 0500   CALCIUM 9.3 09/26/2011 0500   PROT 6.9 09/25/2011 1252   ALBUMIN 2.8* 09/25/2011 1252   AST 15 09/25/2011 1252   ALT 13 09/25/2011 1252   ALKPHOS 97 09/25/2011 1252   BILITOT 0.2* 09/25/2011 1252   GFRNONAA >90 09/26/2011 0500   GFRAA >90 09/26/2011 0500   Lipase  No results found for this basename: lipase       Studies/Results: No results found.  Anti-infectives: Anti-infectives     Start     Dose/Rate Route Frequency Ordered Stop   09/26/11 0100   vancomycin (VANCOCIN) IVPB 1000 mg/200 mL premix        1,000 mg 200 mL/hr over 60 Minutes Intravenous Every 12 hours 09/25/11 1747     09/25/11 1800  piperacillin-tazobactam (ZOSYN) IVPB 3.375 g       3.375 g 12.5 mL/hr over 240 Minutes Intravenous Every 8 hours 09/25/11 1745     09/25/11 1415   piperacillin-tazobactam (ZOSYN) IVPB 3.375 g  Status:  Discontinued        3.375 g 12.5 mL/hr over 240 Minutes Intravenous  Once 09/25/11 1413 09/25/11 1619   09/25/11 1245   vancomycin (VANCOCIN) IVPB 1000 mg/200 mL premix        1,000 mg 200 mL/hr over 60 Minutes Intravenous  Once 09/25/11 1245 09/25/11 1502           Assessment/Plan  1. Left breast abscess, s/p I&D, CXs pending, but no growth to date  Plan: 1. Patient is stable for dc home from our standpoint. 2. Would probably send home with bactrim or doxy for 7 days. 3. Follow up with [place] in a week. 4. HH arranged for dressing changes.    LOS: 3 days    Laurel Smeltz E 09/28/2011

## 2011-09-28 NOTE — Care Management Note (Signed)
    Page 1 of 1   09/28/2011     1:13:27 PM   CARE MANAGEMENT NOTE 09/28/2011  Patient:  KAY, RICCIUTI   Account Number:  [id number]  Date Initiated:  09/25/2011  Documentation initiated by:  Konrad Felix  Subjective/Objective Assessment:   Reviewed H&P and noticed patient probably has active shingles.     Action/Plan:   Notified charge nurse of information and she was not aware. Will place patient on contact isolation.   Anticipated DC Date:  09/27/2011   Anticipated DC Plan:  HOME W HOME HEALTH SERVICES      DC Planning Services  CM consult      [facility] Choice  HOME HEALTH   Choice offered to / List presented to:          [facility] arranged  HH-1 RN      [facility] agency  [facility]   Status of service:  Completed, signed off Medicare Important Message given?   (If response is "NO", the following Medicare IM given date fields will be blank) Date Medicare IM given:   Date Additional Medicare IM given:    Discharge Disposition:  HOME W HOME HEALTH SERVICES  Per UR Regulation:    If discussed at Long Length of Stay Meetings, dates discussed:    Comments:  09/28/2011 1100 Faxed order for [facility] RN to [facility] for scheduled d/c home today. Provided pt with info on Jovita Kussmaul to establish a primary care physician. Pt states he will call to schedule appt. Isidoro Donning RN CCM Case Mgmt phone [phone]

## 2011-09-28 NOTE — Progress Notes (Signed)
Eric Morton discharged Home per MD order.  Discharge instructions reviewed and discussed with the patient, all questions and concerns answered. Copy of instructions and scripts given to patient. Wound care education provided to family member [name] who will be changing incision dressings at home. She verbalizes an understanding of how to perform dressing changes and HH RN already arranged to [facility] to follow up.   [name], [place]  Home Medication Instructions ZOX:[id number]   Printed on:09/28/11 1449  Medication Information                    metFORMIN (GLUCOPHAGE) 1000 MG tablet Take 1,000 mg by mouth daily with breakfast.           gabapentin (NEURONTIN) 400 MG capsule Take 800 mg by mouth every 12 (twelve) hours.           aspirin EC 325 MG tablet Take 325 mg by mouth daily.           lisinopril (PRINIVIL,ZESTRIL) 10 MG tablet Take 2 tablets (20 mg total) by mouth daily.           oxyCODONE-acetaminophen (PERCOCET/ROXICET) 5-325 MG per tablet Take 1-2 tablets by mouth every 6 (six) hours as needed.           doxycycline (VIBRA-TABS) 100 MG tablet Take 1 tablet (100 mg total) by mouth 2 (two) times daily.             Patients skin is clean and dry, with a dressing to the Lt chest that is clean dry and intact. IV site discontinued and catheter remains intact. Site without signs and symptoms of complications. Dressing and pressure applied.  Patient escorted to car by NSMT in a wheelchair,  no distress noted upon discharge.  [name] [facility] 09/28/2011 2:49 PM

## 2011-09-28 NOTE — Discharge Summary (Signed)
PATIENT DETAILS Name: Eric Morton Age: 46 y.o. Sex: male Date of Birth: 22-Oct-1965 MRN: [id number]. Admit Date: 09/25/2011 Admitting Physician: Hollice Espy, MD WUJ:[id number],WGNFAOZH, MD  Recommendations for Outpatient Follow-up:  1. HHRN for Wound Care  PRIMARY DISCHARGE DIAGNOSIS:  Principal Problem:  *Cellulitis of breast of male Active Problems:  HTN (hypertension)  DM (diabetes mellitus)  Leukopenia  ETOH abuse  Cocaine abuse  Tobacco abuse  Neuropathy      PAST MEDICAL HISTORY: Past Medical History  Diagnosis Date  . Hypertension   . Substance abuse   . Peripheral neuropathy   . Hypercholesterolemia   . Type II diabetes mellitus   . Alcohol liver damage     "just a little damage"  . Chronic kidney disease     "jsut a little damage"  . Anxiety   . History of stomach ulcers     DISCHARGE MEDICATIONS: Medication List  As of 09/28/2011 11:08 AM   TAKE these medications         aspirin EC 325 MG tablet   Take 325 mg by mouth daily.      doxycycline 100 MG tablet   Commonly known as: VIBRA-TABS   Take 1 tablet (100 mg total) by mouth 2 (two) times daily.      gabapentin 400 MG capsule   Commonly known as: NEURONTIN   Take 800 mg by mouth every 12 (twelve) hours.      lisinopril 10 MG tablet   Commonly known as: PRINIVIL,ZESTRIL   Take 2 tablets (20 mg total) by mouth daily.      metFORMIN 1000 MG tablet   Commonly known as: GLUCOPHAGE   Take 1,000 mg by mouth daily with breakfast.      oxyCODONE-acetaminophen 5-325 MG per tablet   Commonly known as: PERCOCET/ROXICET   Take 1-2 tablets by mouth every 6 (six) hours as needed.             BRIEF HPI:  See H&P, Labs, Consult and Test reports for all details in brief, patient was admitted for Left breast pain and swelling for 3 weeks  CONSULTATIONS:   general surgery  PERTINENT RADIOLOGIC STUDIES: No results found.   PERTINENT LAB RESULTS: CBC:  Basename 09/26/11 0500 09/25/11 1722    WBC 4.6 2.7*  HGB 15.9 16.2  HCT 47.4 47.0  PLT 215 229   CMET CMP     Component Value Date/Time   NA 136 09/26/2011 0500   K 4.5 09/26/2011 0500   CL 99 09/26/2011 0500   CO2 27 09/26/2011 0500   GLUCOSE 174* 09/26/2011 0500   BUN 7 09/26/2011 0500   CREATININE 0.90 09/26/2011 0500   CALCIUM 9.3 09/26/2011 0500   PROT 6.9 09/25/2011 1252   ALBUMIN 2.8* 09/25/2011 1252   AST 15 09/25/2011 1252   ALT 13 09/25/2011 1252   ALKPHOS 97 09/25/2011 1252   BILITOT 0.2* 09/25/2011 1252   GFRNONAA >90 09/26/2011 0500   GFRAA >90 09/26/2011 0500    GFR Estimated Creatinine Clearance: 106.3 ml/min (by C-G formula based on Cr of 0.9). No results found for this basename: LIPASE:2,AMYLASE:2 in the last 72 hours No results found for this basename: CKTOTAL:3,CKMB:3,CKMBINDEX:3,TROPONINI:3 in the last 72 hours No components found with this basename: POCBNP:3 No results found for this basename: DDIMER:2 in the last 72 hours  Basename 09/25/11 1722  HGBA1C 10.1*   No results found for this basename: CHOL:2,HDL:2,LDLCALC:2,TRIG:2,CHOLHDL:2,LDLDIRECT:2 in the last 72 hours No results found for this basename:  TSH,T4TOTAL,FREET3,T3FREE,THYROIDAB in the last 72 hours No results found for this basename: VITAMINB12:2,FOLATE:2,FERRITIN:2,TIBC:2,IRON:2,RETICCTPCT:2 in the last 72 hours Coags: No results found for this basename: PT:2,INR:2 in the last 72 hours Microbiology: Recent Results (from the past 240 hour(s))  WOUND CULTURE     Status: Normal   Collection Time   09/25/11  2:11 PM      Component Value Range Status Comment   Specimen Description WOUND LEFT BREAST   Final    Special Requests NONE   Final    Gram Stain     Final    Value: MODERATE WBC PRESENT, PREDOMINANTLY PMN     NO SQUAMOUS EPITHELIAL CELLS SEEN     NO ORGANISMS SEEN   Culture NO GROWTH 2 DAYS   Final    Report Status 09/27/2011 FINAL   Final   MRSA PCR SCREENING     Status: Normal   Collection Time   09/26/11  9:04 AM       Component Value Range Status Comment   MRSA by PCR NEGATIVE  NEGATIVE Final   ANAEROBIC CULTURE     Status: Normal (Preliminary result)   Collection Time   09/26/11 10:52 AM      Component Value Range Status Comment   Specimen Description ABSCESS LEFT BREAST   Final    Special Requests PT ON VANC ZOSYN   Final    Gram Stain PENDING   Incomplete    Culture     Final    Value: NO ANAEROBES ISOLATED; CULTURE IN PROGRESS FOR 5 DAYS   Report Status PENDING   Incomplete   CULTURE, ROUTINE-ABSCESS     Status: Normal (Preliminary result)   Collection Time   09/26/11 10:52 AM      Component Value Range Status Comment   Specimen Description ABSCESS LEFT BREAST   Final    Special Requests PT ON VANC ZOSYN   Final    Gram Stain     Final    Value: MODERATE WBC PRESENT,BOTH PMN AND MONONUCLEAR     NO SQUAMOUS EPITHELIAL CELLS SEEN     NO ORGANISMS SEEN   Culture NO GROWTH 2 DAYS   Final    Report Status PENDING   Incomplete      BRIEF HOSPITAL COURSE:   Principal Problem:  *Cellulitis of breast of male -admitted, and started on Vanco/Zosyn -[facility] consulted, I&D done 8/30 -intra-op cultures still negative -per [facility] ok to d/c on oral Doxy -HHRN arranged for wound care  Active Problems:  HTN (hypertension) -continue with Lisinopril, will increase to 20 mg -likely some elevation on BP due to pain   DM (diabetes mellitus) -resume Metformin on d/c -on SSI while inpatient with moderate control  Hyponatremia  -2/2 to dehydration  -resolved with hydration   Leukopenia  -resolved  -likely 2/2 to infection  -HIV-neg   ETOH abuse  -non-tremulous  -no signs of withdrawal   Cocaine abuse  -counseled extensively   Tobacco abuse  -counseled extensively   Neuropathy  -c/w Neurontin   ?vesicular eruption in right flank area  -area essentially unchaged since admission  -doubt Shingles  TODAY-DAY OF DISCHARGE:  Subjective:   [name] today has no headache,no chest abdominal  pain,no new weakness tingling or numbness, feels much better wants to go home today.   Objective:   Blood pressure 177/96, pulse 74, temperature 97.6 F (36.4 C), temperature source Oral, resp. rate 17, height 5\' 8"  (1.727 m), weight 84.687 kg (186 lb 11.2 oz),  SpO2 93.00%.  Intake/Output Summary (Last 24 hours) at 09/28/11 1108 Last data filed at 09/28/11 0700  Gross per 24 hour  Intake   1341 ml  Output      0 ml  Net   1341 ml    Exam Awake Alert, Oriented *3, No new F.N deficits, Normal affect [place].AT,PERRAL Supple Neck,No JVD, No cervical lymphadenopathy appriciated.  Symmetrical Chest wall movement, Good air movement bilaterally, CTAB RRR,No Gallops,Rubs or new Murmurs, No Parasternal Heave +ve B.Sounds, Abd Soft, Non tender, No organomegaly appriciated, No rebound -guarding or rigidity. No Cyanosis, Clubbing or edema, No new Rash or bruise  DISCHARGE CONDITION: Stable  DISPOSITION: HOME  DISCHARGE INSTRUCTIONS:    Activity:  As tolerated   Diet recommendation: Diabetic Diet Heart Healthy diet   Follow-up Information    Follow up with [facility] in 1 week. (Please ask for a Doctor of the Week clinic appointment for 10-07-11)    Contact information:   [address] [place] [address] [phone]         Total Time spent on discharge equals 45 minutes.  SignedJeoffrey Massed 09/28/2011 11:08 AM

## 2011-09-28 NOTE — Plan of Care (Signed)
Problem: Phase III Progression Outcomes Goal: Discharge plan remains appropriate-arrangements made Outcome: Completed/Met Date Met:  09/28/11 possible d/c later today, casemanagement notified pt needs [facility] and low cost clinic info.

## 2011-09-28 NOTE — Progress Notes (Signed)
General surgery attending note:  Patient was interviewed and examined by me. I agree with the evaluation and treatment plan outlined by Ms. Osborne.[name] His soft tissue infection of the left breast appears to be under control. He will follow-up with [place] in the office.  Angelia Mould. Derrell Lolling, M.D., [facility] General and Minimally invasive Surgery Breast and Colorectal Surgery Office:   [phone] Pager:   [phone]

## 2011-09-29 LAB — CULTURE, ROUTINE-ABSCESS: Culture: NO GROWTH

## 2011-09-30 ENCOUNTER — Other Ambulatory Visit (INDEPENDENT_AMBULATORY_CARE_PROVIDER_SITE_OTHER): Payer: Self-pay | Admitting: Surgery

## 2011-09-30 ENCOUNTER — Telehealth (INDEPENDENT_AMBULATORY_CARE_PROVIDER_SITE_OTHER): Payer: Self-pay | Admitting: General Surgery

## 2011-09-30 ENCOUNTER — Encounter (HOSPITAL_COMMUNITY): Payer: Self-pay | Admitting: Surgery

## 2011-09-30 MED ORDER — OXYCODONE-ACETAMINOPHEN 5-325 MG PO TABS: 1.0000 | ORAL_TABLET | Freq: Four times a day (QID) | ORAL | Status: AC | PRN

## 2011-09-30 NOTE — Telephone Encounter (Signed)
Pt's mother called in to ask if her son's pain meds can be refilled.  He is an I&D breast abscess on 8/30.  He was given a Rx for oxycodone-acetaminophen 5/325 q6h prn on 09/28/11.  This pt also has an appt on 9/10 but only has 2 pills left.  They would like to know if it can be refilled.  Informed them that I would deliver this message to Dr. [name] and we would get back in touch with them.

## 2011-09-30 NOTE — Telephone Encounter (Signed)
Called pt back and told him take he has a Rx at the front desk written by Dr [name]

## 2011-10-01 LAB — ANAEROBIC CULTURE

## 2011-10-07 ENCOUNTER — Ambulatory Visit (INDEPENDENT_AMBULATORY_CARE_PROVIDER_SITE_OTHER): Payer: PRIVATE HEALTH INSURANCE | Admitting: General Surgery

## 2011-10-07 ENCOUNTER — Encounter (INDEPENDENT_AMBULATORY_CARE_PROVIDER_SITE_OTHER): Payer: Self-pay | Admitting: General Surgery

## 2011-10-07 VITALS — BP 108/72 | HR 88 | Temp 97.4°F | Resp 16 | Ht 67.5 in | Wt 182.2 lb

## 2011-10-07 DIAGNOSIS — N611 Abscess of the breast and nipple: Secondary | ICD-10-CM

## 2011-10-07 DIAGNOSIS — N61 Mastitis without abscess: Secondary | ICD-10-CM

## 2011-10-07 NOTE — Patient Instructions (Signed)
You can shower and let dressing come out in the shower.  Continue to repack with wet to dry gauze till it heals.  Keep your glucose well controlled.  Call if you need any help.

## 2011-10-07 NOTE — Progress Notes (Signed)
Post Op visit S/p L breast abscess I&D  Pt doing well wet to dry dressings daily.  Still very tender and he's very apprehensive with me doing dressing change.   Wound is 2 cm long and about 1 cm deep.  Clean very pink granular tissue within wound.  No drainage on dressing.   He has some dense indurated tissue around his left nipple he attributes to Lipitor.   Assesment:  Culture from abscees shows no growth Continue wet to dry dressing at home.  He can shower and do dressing change after shower.   Follow up in Clinic PRN

## 2011-10-15 ENCOUNTER — Telehealth (INDEPENDENT_AMBULATORY_CARE_PROVIDER_SITE_OTHER): Payer: Self-pay | Admitting: General Surgery

## 2011-10-15 NOTE — Telephone Encounter (Signed)
Pt called for refill of pain meds; instructed on "step down" to Vicodin and then no more narcotic meds.  Can use ibuprofen with it to wean off, too.  Hydrocodone 5/325 mg, # 30, 1-2 po Q4-6H prn pain, no refill, called to [facility]:  [phone].

## 2012-11-28 ENCOUNTER — Emergency Department (HOSPITAL_COMMUNITY)
Admission: EM | Admit: 2012-11-28 | Discharge: 2012-11-28 | Disposition: A | Discharge status: Home / Self Care | Payer: Self-pay | Attending: Emergency Medicine | Admitting: Emergency Medicine

## 2012-11-28 DIAGNOSIS — Z8719 Personal history of other diseases of the digestive system: Secondary | ICD-10-CM | POA: Insufficient documentation

## 2012-11-28 DIAGNOSIS — G609 Hereditary and idiopathic neuropathy, unspecified: Secondary | ICD-10-CM | POA: Insufficient documentation

## 2012-11-28 DIAGNOSIS — N189 Chronic kidney disease, unspecified: Secondary | ICD-10-CM | POA: Insufficient documentation

## 2012-11-28 DIAGNOSIS — K709 Alcoholic liver disease, unspecified: Secondary | ICD-10-CM | POA: Insufficient documentation

## 2012-11-28 DIAGNOSIS — F17200 Nicotine dependence, unspecified, uncomplicated: Secondary | ICD-10-CM | POA: Insufficient documentation

## 2012-11-28 DIAGNOSIS — E7800 Pure hypercholesterolemia, unspecified: Secondary | ICD-10-CM | POA: Insufficient documentation

## 2012-11-28 DIAGNOSIS — F10929 Alcohol use, unspecified with intoxication, unspecified: Secondary | ICD-10-CM

## 2012-11-28 DIAGNOSIS — I10 Essential (primary) hypertension: Secondary | ICD-10-CM

## 2012-11-28 DIAGNOSIS — Z79899 Other long term (current) drug therapy: Secondary | ICD-10-CM | POA: Insufficient documentation

## 2012-11-28 DIAGNOSIS — Z7982 Long term (current) use of aspirin: Secondary | ICD-10-CM | POA: Insufficient documentation

## 2012-11-28 DIAGNOSIS — F1210 Cannabis abuse, uncomplicated: Secondary | ICD-10-CM | POA: Insufficient documentation

## 2012-11-28 DIAGNOSIS — F411 Generalized anxiety disorder: Secondary | ICD-10-CM | POA: Insufficient documentation

## 2012-11-28 DIAGNOSIS — R739 Hyperglycemia, unspecified: Secondary | ICD-10-CM

## 2012-11-28 DIAGNOSIS — F1410 Cocaine abuse, uncomplicated: Secondary | ICD-10-CM | POA: Insufficient documentation

## 2012-11-28 DIAGNOSIS — F1010 Alcohol abuse, uncomplicated: Secondary | ICD-10-CM | POA: Insufficient documentation

## 2012-11-28 DIAGNOSIS — F1910 Other psychoactive substance abuse, uncomplicated: Secondary | ICD-10-CM

## 2012-11-28 DIAGNOSIS — E119 Type 2 diabetes mellitus without complications: Secondary | ICD-10-CM | POA: Insufficient documentation

## 2012-11-28 DIAGNOSIS — I129 Hypertensive chronic kidney disease with stage 1 through stage 4 chronic kidney disease, or unspecified chronic kidney disease: Secondary | ICD-10-CM | POA: Insufficient documentation

## 2012-11-28 LAB — COMPREHENSIVE METABOLIC PANEL
ALT: 17 U/L (ref 0–53)
AST: 27 U/L (ref 0–37)
Albumin: 3 g/dL — ABNORMAL LOW (ref 3.5–5.2)
CO2: 21 mEq/L (ref 19–32)
Calcium: 9 mg/dL (ref 8.4–10.5)
Creatinine, Ser: 0.86 mg/dL (ref 0.50–1.35)
GFR calc non Af Amer: >90 mL/min (ref >90)
Sodium: 135 mEq/L (ref 135–145)
Total Protein: 7.3 g/dL (ref 6.0–8.3)

## 2012-11-28 LAB — CBC
MCH: 32 pg (ref 26.0–34.0)
MCHC: 36.5 g/dL — ABNORMAL HIGH (ref 30.0–36.0)
MCV: 87.8 fL (ref 78.0–100.0)
Platelets: 120 10*3/uL — ABNORMAL LOW (ref 150–400)
RDW: 13.6 % (ref 11.5–15.5)

## 2012-11-28 LAB — GLUCOSE, CAPILLARY: Glucose-Capillary: 199 mg/dL — ABNORMAL HIGH (ref 70–99)

## 2012-11-28 MED ORDER — LISINOPRIL 20 MG PO TABS
20.0000 mg | ORAL_TABLET | Freq: Every day | ORAL | Status: DC
  Administered 2012-11-28: 20 mg via ORAL
  Filled 2012-11-28: qty 1

## 2012-11-28 NOTE — ED Notes (Signed)
Pt here with family in family consult room.  Per family, EMS was called out this morning for pt's family.  Upon EMS arrival to house, a 2nd EMS was called out for pt.  Pt refused transport.  Pt was LSN when he went to bed last night 2200.  Family reports that pt had L side weakness and was "acting weird" this morning.  Pt transported from family room to B-15 for eval.

## 2012-11-28 NOTE — ED Notes (Signed)
PT home B/P med given.

## 2012-11-28 NOTE — ED Notes (Signed)
PT ambulated in hall unassisted and tol. Well.

## 2012-11-28 NOTE — ED Notes (Addendum)
Family while waiting for Update on Mothers condition reported Eric Morton looked like he was having a stroke. Pt reported he drank 80 ozs of beer , used Cocaine and marijuana up to 1am today. Pt denies any Hx of seizures or stroke.

## 2012-11-28 NOTE — Discharge Instructions (Signed)
Avoid alcohol and drug use - see resource guide regarding community resources available for help with substance abuse. Your blood pressure is high, limited salt intake, continue blood pressure medication, and follow up with primary care doctor for recheck this week. Your blood sugar is high, follow diabetic diet, continue diabetic medication, and follow up with primary care doctor this week. Return to ER if worse, new symptoms, other concern.       Alcohol Problems Most adults who drink alcohol drink in moderation (not a lot) are at low risk for developing problems related to their drinking. However, all drinkers, including low-risk drinkers, should know about the health risks connected with drinking alcohol. RECOMMENDATIONS FOR LOW-RISK DRINKING  Drink in moderation. Moderate drinking is defined as follows:   Men - no more than 2 drinks per day.  Nonpregnant women - no more than 1 drink per day.  Over age 52 - no more than 1 drink per day. A standard drink is 12 grams of pure alcohol, which is equal to a 12 ounce bottle of beer or wine cooler, a 5 ounce glass of wine, or 1.5 ounces of distilled spirits (such as whiskey, brandy, vodka, or rum).  ABSTAIN FROM (DO NOT DRINK) ALCOHOL:  When pregnant or considering pregnancy.  When taking a medication that interacts with alcohol.  If you are alcohol dependent.  A medical condition that prohibits drinking alcohol (such as ulcer, liver disease, or heart disease). DISCUSS WITH YOUR CAREGIVER:  If you are at risk for coronary heart disease, discuss the potential benefits and risks of alcohol use: Light to moderate drinking is associated with lower rates of coronary heart disease in certain populations (for example, men over age 62 and postmenopausal women). Infrequent or nondrinkers are advised not to begin light to moderate drinking to reduce the risk of coronary heart disease so as to avoid creating an alcohol-related problem. Similar  protective effects can likely be gained through proper diet and exercise.  Women and the elderly have smaller amounts of body water than men. As a result women and the elderly achieve a higher blood alcohol concentration after drinking the same amount of alcohol.  Exposing a fetus to alcohol can cause a broad range of birth defects referred to as Fetal Alcohol Syndrome (FAS) or Alcohol-Related Birth Defects (ARBD). Although FAS/ARBD is connected with excessive alcohol consumption during pregnancy, studies also have reported neurobehavioral problems in infants born to mothers reporting drinking an average of 1 drink per day during pregnancy.  Heavier drinking (the consumption of more than 4 drinks per occasion by men and more than 3 drinks per occasion by women) impairs learning (cognitive) and psychomotor functions and increases the risk of alcohol-related problems, including accidents and injuries. CAGE QUESTIONS:   Have you ever felt that you should Cut down on your drinking?  Have people Annoyed you by criticizing your drinking?  Have you ever felt bad or Guilty about your drinking?  Have you ever had a drink first thing in the morning to steady your nerves or get rid of a hangover (Eye opener)? If you answered positively to any of these questions: You may be at risk for alcohol-related problems if alcohol consumption is:   Men: Greater than 14 drinks per week or more than 4 drinks per occasion.  Women: Greater than 7 drinks per week or more than 3 drinks per occasion. Do you or your family have a medical history of alcohol-related problems, such as:  Blackouts.  Sexual dysfunction.  Depression.  Trauma.  Liver dysfunction.  Sleep disorders.  Hypertension.  Chronic abdominal pain.  Has your drinking ever caused you problems, such as problems with your family, problems with your work (or school) performance, or accidents/injuries?  Do you have a compulsion to drink or a  preoccupation with drinking?  Do you have poor control or are you unable to stop drinking once you have started?  Do you have to drink to avoid withdrawal symptoms?  Do you have problems with withdrawal such as tremors, nausea, sweats, or mood disturbances?  Does it take more alcohol than in the past to get you high?  Do you feel a strong urge to drink?  Do you change your plans so that you can have a drink?  Do you ever drink in the morning to relieve the shakes or a hangover? If you have answered a number of the previous questions positively, it may be time for you to talk to your caregivers, family, and friends and see if they think you have a problem. Alcoholism is a chemical dependency that keeps getting worse and will eventually destroy your health and relationships. Many alcoholics end up dead, impoverished, or in prison. This is often the end result of all chemical dependency.  Do not be discouraged if you are not ready to take action immediately.  Decisions to change behavior often involve up and down desires to change and feeling like you cannot decide.  Try to think more seriously about your drinking behavior.  Think of the reasons to quit. WHERE TO GO FOR ADDITIONAL INFORMATION   The National Institute on Alcohol Abuse and Alcoholism (NIAAA) BasicStudents.dk  [employer] on Alcoholism and Drug Dependence (NCADD) www.ncadd.org  American Society of Addiction Medicine (ASAM) RoyalDiary.gl  Document Released: 01/13/2005 Document Revised: 04/07/2011 Document Reviewed: 09/01/2007 [facility] Patient Information 2014 [place], [place].      Alcohol Intoxication You have alcohol intoxication when the amount of alcohol that you have consumed has impaired your ability to mentally and physically function. There are a variety of factors that contribute to the level at which alcohol intoxication can occur, such as age, gender, weight, frequency of alcohol consumption,  medication use, and the presence of other medical conditions, such as diabetes, seizures, or heart conditions. The blood alcohol level test measures the concentration of alcohol in your blood. In most states, your blood alcohol level must be lower than 80 mg/dL (1.61%) to legally drive. However, many dangerous effects of alcohol can occur at much lower levels. Alcohol directly impairs the normal chemical activity of the brain and is said to be a chemical depressant. Alcohol can cause drowsiness, stupor, respiratory failure, and coma. Other physical effects can include headache, vomiting, vomiting of blood, abdominal pain, a fast heartbeat, difficulty breathing, anxiety, and amnesia. Alcohol intoxication can also lead to dangerous and life-threatening activities, such as fighting, dangerous operation of vehicles or heavy machinery, and risky sexual behavior. Alcohol can be especially dangerous when taken with other drugs. Some of these drugs are:  Sedatives.  Painkillers.  Marijuana.  Tranquilizers.  Antihistamines.  Muscle relaxants.  Seizure medicine. Many of the effects of acute alcohol intoxication are temporary. However, repeated alcohol intoxication can lead to severe medical illnesses. If you have alcohol intoxication, you should:  Stay hydrated. Drink enough water and fluids to keep your urine clear or pale yellow. Avoid excessive caffeine because this can further lead to dehydration.  Eat a healthy diet. You may have residual nausea, headache, and loss of  appetite, but it is still important that you maintain good nutrition. You can start with clear liquids.  Take nonsteroidal anti-inflammatory medications as needed for headaches, but make sure to do so with small meals. You should avoid acetaminophen for several days after having alcohol intoxication because the combination of alcohol and acetaminophen can be toxic to your liver. If you have frequent alcohol intoxication, ask your  friends and family if they think you have a drinking problem. For further help, contact:  Your caregiver.  Alcoholics Anonymous (AA).  A drug or alcohol rehabilitation program. SEEK MEDICAL CARE IF:   You have persistent vomiting.  You have persistent pain in any part of your body.  You do not feel better after a few days. SEEK IMMEDIATE MEDICAL CARE IF:   You become shaky or tremble when you try to stop drinking.  You shake uncontrollably (seizure).  You throw up (vomit) blood. This may be bright red or it may look like black coffee grounds.  You have blood in the stool. This may be bright red or appear as a black, tarry, bad smelling stool.  You become lightheaded or faint. ANY OF THESE SYMPTOMS MAY REPRESENT A SERIOUS PROBLEM THAT IS AN EMERGENCY. Do not wait to see if the symptoms will go away. Get medical help right away. Call your local emergency services (911 in [place] DO NOT drive yourself to the hospital. MAKE SURE YOU:   Understand these instructions.  Will watch your condition.  Will get help right away if you are not doing well or get worse. Document Released: 10/23/2004 Document Revised: 04/07/2011 Document Reviewed: 07/02/2009 [facility] Patient Information 2014 [place], [place].    Cocaine Abuse and Chemical Dependency WHEN IS DRUG USE A PROBLEM? Anytime drug use is interfering with normal living activities it has become abuse. This includes problems with family and friends. Psychological dependence has developed when your mind tells you that the drug is needed. This is usually followed by physical dependence which has developed when continuing increases of drug are required to get the same feeling or "high". This is known as addiction or chemical dependency. A person's risk is much higher if there is a history of chemical dependency in the family. SIGNS OF CHEMICAL DEPENDENCY:  Been told by friends or family that drugs have become a problem.  Fighting when  using drugs.  Having blackouts (not remembering what you do while using).  Feel sick from using drugs but continue using.  Lie about use or amounts of drugs (chemicals) used.  Need chemicals to get you going.  Suffer in work International aid/development worker or school because of drug use.  Get sick from use of drugs but continue to use anyway.  Need drugs to relate to people or feel comfortable in social situations.  Use drugs to forget problems. Yes answered to any of the above signs of chemical dependency indicates there are problems. The longer the use of drugs continues, the greater the problems will become. If there is a family history of drug or alcohol use it is best not to experiment with these drugs. Experimentation leads to tolerance and needing to use more of the drug to get the same feeling. This is followed by addiction where drugs become the most important part of life. It becomes more important to take drugs than participate in the other usual activities of life including relating to friends and family. Addiction is followed by dependency where drugs are now needed not just to get high but to  feel normal. Addiction cannot be cured but it can be stopped. This often requires outside help and the care of professionals. Treatment centers are listed in the yellow pages under: Cocaine, Narcotics, and Alcoholics Anonymous. Most hospitals and clinics can refer you to a specialized care center. WHAT IS COCAINE? Cocaine is a strong nervous system stimulant which speeds up the body and gives the user the feeling that they have increased energy, loss of appetite and feelings of great pleasure. This "high" which begins within several minutes and lasts for less than an hour is followed by a "crash". The crash and depressed feelings that come with it cause a craving for the drug to regain the high. HOW IS COCANINE USED? Cocaine is snorted, injected, and smoked as free- base or crack. Because smoking the drug  produces a greater high it is also associated with a greater low. It is therefore more rapidly addicting. WHAT ARE THE EFFECTS OF COCAINE? It is an anesthetic (pain killer) and a stimulant (it causes a high which gives a false feeling of well being). It increases heart and breathing rates with increases in body temperature and blood pressure. It removes appetite. It causes seizures (convulsions) along with nausea (feeling sick to your stomach), vomiting and stomach pain. This dangerous combination can lead to death. Trying to keep the high feeling leads to greater and greater drug use and this leads to addiction. Addiction can only be helped by stopping use of all chemicals. This is hard but may save your life. If the addiction is continued, the only possible outcome is loss of self respect and self esteem, violence, death, and eventually prison if the addict is fortunate enough to be caught and able to receive help prior to this last life ending event. OTHER HEALTH RISKS OF COCAINE AND ALL DRUG USE ARE:  The increased possibility of getting AIDS or hepatitis (liver inflammation).  Having a baby born which is addicted to cocaine and must go through painful withdrawal including shaking, jerking, and crying in pain. Many of the babies die. Other babies go through life with lifelong disabilities and learning problems. HOW TO STAY DRUG FREE ONCE YOU HAVE QUIT USING:  Develop healthy activities and form friends who do not use drugs.  Stay away from the drug scene.  Tell the pusher or former friend you have other better things to do.  Have ready excuses available about why you cannot use. For more help or information contact your local physician, clinic, hospital or dial 1-800-cocaine 7080477390). Document Released: 01/11/2000 Document Revised: 04/07/2011 Document Reviewed: 09/01/2007 [facility] Patient Information 2014 [place], [place].    Hypertension As your heart beats, it forces blood  through your arteries. This force is your blood pressure. If the pressure is too high, it is called hypertension (HTN) or high blood pressure. HTN is dangerous because you may have it and not know it. High blood pressure may mean that your heart has to work harder to pump blood. Your arteries may be narrow or stiff. The extra work puts you at risk for heart disease, stroke, and other problems.  Blood pressure consists of two numbers, a higher number over a lower, 110/72, for example. It is stated as "110 over 72." The ideal is below 120 for the top number (systolic) and under 80 for the bottom (diastolic). Write down your blood pressure today. You should pay close attention to your blood pressure if you have certain conditions such as:  Heart failure.  Prior heart attack.  Diabetes  Chronic kidney disease.  Prior stroke.  Multiple risk factors for heart disease. To see if you have HTN, your blood pressure should be measured while you are seated with your arm held at the level of the heart. It should be measured at least twice. A one-time elevated blood pressure reading (especially in the Emergency Department) does not mean that you need treatment. There may be conditions in which the blood pressure is different between your right and left arms. It is important to see your caregiver soon for a recheck. Most people have essential hypertension which means that there is not a specific cause. This type of high blood pressure may be lowered by changing lifestyle factors such as:  Stress.  Smoking.  Lack of exercise.  Excessive weight.  Drug/tobacco/alcohol use.  Eating less salt. Most people do not have symptoms from high blood pressure until it has caused damage to the body. Effective treatment can often prevent, delay or reduce that damage. TREATMENT  When a cause has been identified, treatment for high blood pressure is directed at the cause. There are a large number of medications to  treat HTN. These fall into several categories, and your caregiver will help you select the medicines that are best for you. Medications may have side effects. You should review side effects with your caregiver. If your blood pressure stays high after you have made lifestyle changes or started on medicines,   Your medication(s) may need to be changed.  Other problems may need to be addressed.  Be certain you understand your prescriptions, and know how and when to take your medicine.  Be sure to follow up with your caregiver within the time frame advised (usually within two weeks) to have your blood pressure rechecked and to review your medications.  If you are taking more than one medicine to lower your blood pressure, make sure you know how and at what times they should be taken. Taking two medicines at the same time can result in blood pressure that is too low. SEEK IMMEDIATE MEDICAL CARE IF:  You develop a severe headache, blurred or changing vision, or confusion.  You have unusual weakness or numbness, or a faint feeling.  You have severe chest or abdominal pain, vomiting, or breathing problems. MAKE SURE YOU:   Understand these instructions.  Will watch your condition.  Will get help right away if you are not doing well or get worse. Document Released: 01/13/2005 Document Revised: 04/07/2011 Document Reviewed: 09/03/2007 [facility] Patient Information 2014 [place], [place].    Hyperglycemia Hyperglycemia occurs when the glucose (sugar) in your blood is too high. Hyperglycemia can happen for many reasons, but it most often happens to people who do not know they have diabetes or are not managing their diabetes properly.  CAUSES  Whether you have diabetes or not, there are other causes of hyperglycemia. Hyperglycemia can occur when you have diabetes, but it can also occur in other situations that you might not be as aware of, such as: Diabetes  If you have diabetes and are having  problems controlling your blood glucose, hyperglycemia could occur because of some of the following reasons:  Not following your meal plan.  Not taking your diabetes medications or not taking it properly.  Exercising less or doing less activity than you normally do.  Being sick. Pre-diabetes  This cannot be ignored. Before people develop Type 2 diabetes, they almost always have "pre-diabetes." This is when your blood glucose levels are higher than  normal, but not yet high enough to be diagnosed as diabetes. Research has shown that some long-term damage to the body, especially the heart and circulatory system, may already be occurring during pre-diabetes. If you take action to manage your blood glucose when you have pre-diabetes, you may delay or prevent Type 2 diabetes from developing. Stress  If you have diabetes, you may be "diet" controlled or on oral medications or insulin to control your diabetes. However, you may find that your blood glucose is higher than usual in the hospital whether you have diabetes or not. This is often referred to as "stress hyperglycemia." Stress can elevate your blood glucose. This happens because of hormones put out by the body during times of stress. If stress has been the cause of your high blood glucose, it can be followed regularly by your caregiver. That way he/she can make sure your hyperglycemia does not continue to get worse or progress to diabetes. Steroids  Steroids are medications that act on the infection fighting system (immune system) to block inflammation or infection. One side effect can be a rise in blood glucose. Most people can produce enough extra insulin to allow for this rise, but for those who cannot, steroids make blood glucose levels go even higher. It is not unusual for steroid treatments to "uncover" diabetes that is developing. It is not always possible to determine if the hyperglycemia will go away after the steroids are stopped. A special  blood test called an A1c is sometimes done to determine if your blood glucose was elevated before the steroids were started. SYMPTOMS  Thirsty.  Frequent urination.  Dry mouth.  Blurred vision.  Tired or fatigue.  Weakness.  Sleepy.  Tingling in feet or leg. DIAGNOSIS  Diagnosis is made by monitoring blood glucose in one or all of the following ways:  A1c test. This is a chemical found in your blood.  Fingerstick blood glucose monitoring.  Laboratory results. TREATMENT  First, knowing the cause of the hyperglycemia is important before the hyperglycemia can be treated. Treatment may include, but is not be limited to:  Education.  Change or adjustment in medications.  Change or adjustment in meal plan.  Treatment for an illness, infection, etc.  More frequent blood glucose monitoring.  Change in exercise plan.  Decreasing or stopping steroids.  Lifestyle changes. HOME CARE INSTRUCTIONS   Test your blood glucose as directed.  Exercise regularly. Your caregiver will give you instructions about exercise. Pre-diabetes or diabetes which comes on with stress is helped by exercising.  Eat wholesome, balanced meals. Eat often and at regular, fixed times. Your caregiver or nutritionist will give you a meal plan to guide your sugar intake.  Being at an ideal weight is important. If needed, losing as little as 10 to 15 pounds may help improve blood glucose levels. SEEK MEDICAL CARE IF:   You have questions about medicine, activity, or diet.  You continue to have symptoms (problems such as increased thirst, urination, or weight gain). SEEK IMMEDIATE MEDICAL CARE IF:   You are vomiting or have diarrhea.  Your breath smells fruity.  You are breathing faster or slower.  You are very sleepy or incoherent.  You have numbness, tingling, or pain in your feet or hands.  You have chest pain.  Your symptoms get worse even though you have been following your caregiver's  orders.  If you have any other questions or concerns. Document Released: 07/09/2000 Document Revised: 04/07/2011 Document Reviewed: 05/12/2011 [employer] Patient Information  2014 [employer]   1800 Calorie Diet for Diabetes Meal Planning The 1800 calorie diet is designed for eating up to 1800 calories each day. Following this diet and making healthy meal choices can help improve overall health. This diet controls blood sugar (glucose) levels and can also help lower blood pressure and cholesterol. SERVING SIZES Measuring foods and serving sizes helps to make sure you are getting the right amount of food. The list below tells how big or small some common serving sizes are:  1 oz.........4 stacked dice.  3 oz........Marland KitchenDeck of cards.  1 tsp.......Marland KitchenTip of little finger.  1 tbs......Marland KitchenMarland KitchenThumb.  2 tbs.......Marland KitchenGolf ball.   cup......Marland KitchenHalf of a fist.  1 cup.......Marland KitchenA fist. GUIDELINES FOR CHOOSING FOODS The goal of this diet is to eat a variety of foods and limit calories to 1800 each day. This can be done by choosing foods that are low in calories and fat. The diet also suggests eating small amounts of food frequently. Doing this helps control your blood glucose levels so they do not get too high or too low. Each meal or snack may include a protein food source to help you feel more satisfied and to stabilize your blood glucose. Try to eat about the same amount of food around the same time each day. This includes weekend days, travel days, and days off work. Space your meals about 4 to 5 hours apart and add a snack between them if you wish.  For example, a daily food plan could include breakfast, a morning snack, lunch, dinner, and an evening snack. Healthy meals and snacks include whole grains, vegetables, fruits, lean meats, poultry, fish, and dairy products. As you plan your meals, select a variety of foods. Choose from the bread and starch, vegetable, fruit, dairy, and meat/protein groups.  Examples of foods from each group and their suggested serving sizes are listed below. Use measuring cups and spoons to become familiar with what a healthy portion looks like. Bread and Starch Each serving equals 15 grams of carbohydrates.  1 slice bread.   bagel.   cup cold cereal (unsweetened).   cup hot cereal or mashed potatoes.  1 small potato (size of a computer mouse).   cup cooked pasta or rice.   English muffin.  1 cup broth-based soup.  3 cups of popcorn.  4 to 6 whole-wheat crackers.   cup cooked beans, peas, or corn. Vegetable Each serving equals 5 grams of carbohydrates.   cup cooked vegetables.  1 cup raw vegetables.   cup tomato or vegetable juice. Fruit Each serving equals 15 grams of carbohydrates.  1 small apple or orange.  1 cup watermelon or strawberries.   cup applesauce (no sugar added).  2 tbs raisins.   banana.   cup canned fruit, packed in water, its own juice, or sweetened with a sugar substitute.   cup unsweetened fruit juice. Dairy Each serving equals 12 to 15 grams of carbohydrates.  1 cup fat-free milk.  6 oz artificially sweetened yogurt or plain yogurt.  1 cup low-fat buttermilk.  1 cup soy milk.  1 cup almond milk. Meat/Protein  1 large egg.  2 to 3 oz meat, poultry, or fish.   cup low-fat cottage cheese.  1 tbs peanut butter.  1 oz low-fat cheese.   cup tuna in water.   cup tofu. Fat  1 tsp oil.  1 tsp trans-fat-free margarine.  1 tsp butter.  1 tsp mayonnaise.  2 tbs avocado.  1 tbs salad dressing.  1 tbs cream cheese.  2 tbs sour cream. SAMPLE 1800 CALORIE DIET PLAN Breakfast   cup unsweetened cereal (1 carb serving).  1 cup fat-free milk (1 carb serving).  1 slice whole-wheat toast (1 carb serving).   small banana (1 carb serving).  1 scrambled egg.  1 tsp trans-fat-free margarine. Lunch  Tuna sandwich.  2 slices whole-wheat bread (2 carb servings).    cup canned tuna in water, drained.  1 tbs reduced fat mayonnaise.  1 stalk celery, chopped.  2 slices tomato.  1 lettuce leaf.  1 cup carrot sticks.  24 to 30 seedless grapes (2 carb servings).  6 oz light yogurt (1 carb serving). Afternoon Snack  3 graham cracker squares (1 carb serving).  Fat-free milk, 1 cup (1 carb serving).  1 tbs peanut butter. Dinner  3 oz salmon, broiled with 1 tsp oil.  1 cup mashed potatoes (2 carb servings) with 1 tsp trans-fat-free margarine.  1 cup fresh or frozen green beans.  1 cup steamed asparagus.  1 cup fat-free milk (1 carb serving). Evening Snack  3 cups air-popped popcorn (1 carb serving).  2 tbs parmesan cheese sprinkled on top. MEAL PLAN Use this worksheet to help you make a daily meal plan based on the 1800 calorie diet suggestions. If you are using this plan to help you control your blood glucose, you may interchange carbohydrate-containing foods (dairy, starches, and fruits). Select a variety of fresh foods of varying colors and flavors. The total amount of carbohydrate in your meals or snacks is more important than making sure you include all of the food groups every time you eat. Choose from the following foods to build your day's meals:  8 Starches.  4 Vegetables.  3 Fruits.  2 Dairy.  6 to 7 oz Meat/Protein.  Up to 4 Fats. Your dietician can use this worksheet to help you decide how many servings and which types of foods are right for you. BREAKFAST Food Group and Servings / Food Choice Starch ________________________________________________________ Dairy _________________________________________________________ Fruit _________________________________________________________ Meat/Protein __________________________________________________ Fat ___________________________________________________________ LUNCH Food Group and Servings / Food Choice Starch  ________________________________________________________ Meat/Protein __________________________________________________ Vegetable _____________________________________________________ Fruit _________________________________________________________ Dairy _________________________________________________________ Fat ___________________________________________________________ Aura Fey Food Group and Servings / Food Choice Starch ________________________________________________________ Meat/Protein __________________________________________________ Fruit __________________________________________________________ Dairy _________________________________________________________ Laural Golden Food Group and Servings / Food Choice Starch _________________________________________________________ Meat/Protein ___________________________________________________ Dairy __________________________________________________________ Vegetable ______________________________________________________ Fruit ___________________________________________________________ Fat ____________________________________________________________ Lollie Sails Food Group and Servings / Food Choice Fruit __________________________________________________________ Meat/Protein ___________________________________________________ Dairy __________________________________________________________ Starch _________________________________________________________ DAILY TOTALS Starch ____________________________ Vegetable _________________________ Fruit _____________________________ Dairy _____________________________ Meat/Protein______________________ Fat _______________________________ Document Released: 08/05/2004 Document Revised: 04/07/2011 Document Reviewed: 11/29/2010 ExitCare Patient Information 2014 [place].    RESOURCE GUIDE  Chronic Pain Problems: Contact [facility]  [phone] Patients  need to be referred by their primary care doctor.  Insufficient Money for Medicine: Contact United Way:  call [phone]  No Primary Care Doctor: - Call Health Connect  [phone] - can help you locate a primary care doctor that  accepts your insurance, provides certain services, etc. - Physician Referral [phone]  Agencies that provide inexpensive medical care: - [facility]  [phone] - [facility]  [phone] - [facility]  [phone] - Women's Clinic  [phone] - Planned Parenthood  [phone] - [facility]  [phone]  Medicaid-accepting [facility] Providers: - [facility]- [address]  [phone], Mon-Fri 9am-7pm, Sat 9am-1pm - Celesta Gentile  [facility]- [address] [place], Suite [place]  [phone] - [facility]- [address] [place]  [phone] [facility]- [address]  [phone] - Renaye Rakers- [address] [place], [address], [phone]  Only accepts [place] patients after they have their name  applied to their card  Self Pay (no insurance) in [facility]: - Sickle Cell Patients - [facility]  [address] [place], [phone] - [facility]- [address] [place]  [phone]       [facility] [place]- [address]       -     [facility]- see information above (Speak to [employer] if you do not have insurance)       -  [facility]- [address],  [phone]       -  [facility]- [address] [phone]       -  Dr Julio Sicks-  [address], [place], [phone]       -  [facility] - [address], [phone]       -  [facility]- [address] [phone], also [address], [phone]       -     [facility]- [address] [place],  [phone], 1st & 3rd Saturday         every month, 10am-1pm  -     [facility]   [address] [employer], [place].   Phone:  [phone], Fax:  [phone]. Hours of Operation:  9 am - 6 pm, M-F.  -     [facility]   [address], [place]   Phone: [phone], Fax: [phone]. Hours of Operation:  8:30 am - 5:30 pm, M-F.  [facility] [address] [place], [place] [address] [phone]  [facility] [address] [place], [place] [address] [phone]  1) Find a Doctor and Pay Out of Pocket Although you won't have to find out who is covered by your insurance plan, it is a good idea to ask around and get recommendations. You will then need to call the office and see if the doctor you have chosen will accept you as a new patient and what types of options they offer for patients who are self-pay. Some doctors offer discounts or will set up payment plans for their patients who do not have insurance, but you will need to ask so you aren't surprised when you get to your appointment.  2) Contact Your Local Health Department Not all health departments have doctors that can see patients for sick visits, but many do, so it is worth a call to see if yours does. If you don't know where your local health department is, you can check in your phone book. The CDC also has a tool to help you locate your state's health department, and many state websites also have listings of all of their local health departments.  3) Find a Walk-in Clinic If your illness is not likely to be very severe or complicated, you may want to try a walk in clinic. These are popping up all over the country in pharmacies, drugstores, and shopping centers. They're usually staffed  by nurse practitioners or physician assistants that have been trained to treat common illnesses and complaints. They're usually fairly quick and inexpensive. However,  if you have serious medical issues or chronic medical problems, these are probably not your best option  STD Testing - [facility] Department of [facility] [place], STD Clinic, [address] [place], phone [phone] or [phone].  Monday - Friday, call for an appointment. [facility] Department of [employer], STD Clinic, [address], [place], phone [phone] or [phone].  Monday - Friday, call for an appointment.  Abuse/Neglect: [facility] Child Abuse Hotline (551)316-9193 [facility] Child Abuse Hotline [phone] (After Hours)  Emergency Shelter:  [facility] [phone]  Maternity Homes: - Room at the [place] [facility] [phone] - [facility] [phone]  MRSA Hotline #:   279 305 3027  Dental Assistance If unable to pay or uninsured, contact:  [facility]. to become qualified for the adult dental clinic.  Patients with Medicaid: [facility] [phone] [address], [phone] [address] [phone]  If unable to pay, or uninsured, contact [facility] [employer] [phone] in [place], [phone] in [facility]) to become qualified for the adult dental clinic  [facility] [address] [place], [place] [address] [phone] [email or web address]  Other Proofreader Services: - [facility]- [address] [place], [place], [place], [address], [phone], Ext. [phone], 2nd and 4th Thursday of the month at 6:30am.  10 clients each day by appointment, can sometimes see walk-in patients if someone does not show for an appointment. [facility]- [address] [place], [place], [address], [phone] - [facility] [address] [place], [place], [address], [phone] - [facility]- [phone] [facility]- [phone] [facility]  Health [phone]       Behavioral Health Resources in the [facility]  Intensive Outpatient Programs: [facility]      [address] [place], [place] [phone] Both a day and evening program       [facility] Outpatient     [address]        [place], [place] [address] [phone]         ADS: [facility] [address] [place] [phone]  [facility] ACCESS LINE: [phone] or [phone] [address] [place], [place] [address] [email or web address]   Substance Abuse Resources: - [facility] Drug [facility]  [phone] - [facility] [phone] - [facility] [phone] Floydene Flock [phone] - Residential & Outpatient Substance Abuse Program  [phone]  Psychological Services: [facility]  [phone] [facility]  [phone] - [facility], [phone] [place]. [address] [place], ACCESS LINE: [phone] or [phone], [email or web address]  Mobile Crisis Teams:                                        Therapeutic Alternatives         Mobile Crisis Care Unit [phone]             [facility] [address] [place]in[place] Otto [phone]  [facility] [address] [place] [phone]  Self-Help/Support Groups: Mental Health Assoc. of [employer] of support groups [phone]) [phone] (call for more info)  Narcotics Anonymous (NA) Caring Services [address] [place] - 2 meetings at this location  Residential Treatment Programs:  [facility]      [address]        [place]       [phone]         [facility] [address] [place] [address]78 [place], [place]  [address] [phone]  [facility]  Treatment Facility  [address] [place], [place] [address] [phone] Admissions: 8am-3pm M-F  [facility]     [address]        [place], [place] [address]       [phone]         [facility] [address] [place], [place] [phone]  [facility] [address] [place], [place] [phone]  [facility] - Intensive Outpatient      [address]     [place], [place]       [phone]         [facility]     [address] [place], [place] [phone] or [phone]  Residential Treatment Services (RTS), Medicaid [address] [place], [place] [phone]  [facility]ellowship [address]                                               [address] [place] [phone]  [facility] Resources: [phone]               General Therapy                                                Angie Fava, PhD        [address] [place], [place] [address]         [phone]   Insurance  [facility]   [address] [place], [place] [address] [phone]  [facility] [address] [place], [place] [address] [phone] Insurance/Medicaid/sponsorship through [facility]                                              [address]                                        [place], [place] [address]    Therapy/tele-psych/case         [phone]          [facility] [address], [place]  [address]  Adolescent/group home/case  management [phone]                                           Creola Corn PhD       General therapy       Insurance   [phone]         Dr. Lolly Mustache, [place], M-F [phone]  [facility]  [employer] [facility] Dept. [address]                 [address]         [address] [place] [address]  Blondell Reveal Phone:  [phone]                                  Phone:  [phone]                   Phone:  [phone]  [facility], [phone] - [facility] - [facility]- [phone]       -     [facility] in [place], [address],             [phone], Insurance  [place] Child Abuse Hotline 907-428-2816 or 9036166172 (After Hours)

## 2012-11-28 NOTE — ED Provider Notes (Signed)
CSN: [id number]     Arrival date & time 11/28/12  0809 History   First MD Initiated Contact with Patient 11/28/12 [phone]     Chief Complaint  Patient presents with  . Altered Mental Status  . Drug Problem  . Alcohol Problem   (Consider location/radiation/quality/duration/timing/severity/associated sxs/prior Treatment) Patient is a 47 y.o. male presenting with altered mental status, drug problem, and alcohol problem. The history is provided by the patient.  Altered Mental Status Associated symptoms: no abdominal pain, no fever, no headaches, no rash, no vomiting and no weakness   Drug Problem Pertinent negatives include no chest pain, no abdominal pain, no headaches and no shortness of breath.  Alcohol Problem Pertinent negatives include no chest pain, no abdominal pain, no headaches and no shortness of breath.  pt w hx htn, DM, substance abuse, from home.  ems was called to home as pts family member/mother was unresponsive, and in resp distress.  When ems was there, they had called 2nd unit as pt was 'acting strange', altered mental status this morning - pt refuses transport, but was brought to hospital w rest of family to check on status of their mother.  Once in family waiting room, pt agreed to be seen.  Pt reports using cocaine, and drinking etoh last night and into this am.  Denies trauma, fall or injury. Denies pain. No headache. No cp or sob. No abd pain or nv. No numbness/weakness.    Past Medical History  Diagnosis Date  . Hypertension   . Substance abuse   . Peripheral neuropathy   . Hypercholesterolemia   . Type II diabetes mellitus   . Alcohol liver damage     "just a little damage"  . Chronic kidney disease     "jsut a little damage"  . Anxiety   . History of stomach ulcers    Past Surgical History  Procedure Laterality Date  . No past surgeries    . Irrigation and debridement abscess  09/26/2011    Procedure: IRRIGATION AND DEBRIDEMENT ABSCESS;  Surgeon: Wilmon Arms.  [name], MD;  Location: [facility] OR;  Service: General;  Laterality: Left;  Incision and Drainage left breast abscess   No family history on file. History  Substance Use Topics  . Smoking status: Current Every Day Smoker -- 0.50 packs/day for 32 years    Types: Cigarettes  . Smokeless tobacco: Never Used  . Alcohol Use: 42.0 oz/week    70 Cans of beer per week     Comment: at least 120 oz of beer daily    Review of Systems  Constitutional: Negative for fever.  HENT: Negative for sore throat.   Eyes: Negative for redness.  Respiratory: Negative for cough and shortness of breath.   Cardiovascular: Negative for chest pain.  Gastrointestinal: Negative for vomiting, abdominal pain and diarrhea.  Genitourinary: Negative for flank pain.  Musculoskeletal: Negative for back pain and neck pain.  Skin: Negative for rash.  Neurological: Negative for weakness, numbness and headaches.  Hematological: Does not bruise/bleed easily.  Psychiatric/Behavioral: The patient is not nervous/anxious.     Allergies  Review of patient's allergies indicates no known allergies.  Home Medications   Current Outpatient Rx  Name  Route  Sig  Dispense  Refill  . aspirin EC 325 MG tablet   Oral   Take 325 mg by mouth daily.         [name] gabapentin (NEURONTIN) 400 MG capsule   Oral   Take 800 mg by  mouth every 12 (twelve) hours.         [name] lisinopril (PRINIVIL,ZESTRIL) 10 MG tablet   Oral   Take 2 tablets (20 mg total) by mouth daily.   30 tablet   0   . metFORMIN (GLUCOPHAGE) 1000 MG tablet   Oral   Take 1,000 mg by mouth daily with breakfast.          BP 154/108  Pulse 87  Temp(Src) 98.1 F (36.7 C) (Oral)  Resp 20  SpO2 100% Physical Exam  Nursing note and vitals reviewed. Constitutional: He appears well-developed and well-nourished. No distress.  HENT:  Head: Atraumatic.  Mouth/Throat: Oropharynx is clear and moist.  Eyes: Conjunctivae are normal. Pupils are equal, round, and reactive to  light. No scleral icterus.  Neck: Normal range of motion. Neck supple. No tracheal deviation present.  Cardiovascular: Normal rate, regular rhythm, normal heart sounds and intact distal pulses.   Pulmonary/Chest: Effort normal and breath sounds normal. No accessory muscle usage. No respiratory distress.  Abdominal: Soft. He exhibits no distension. There is no tenderness.  Musculoskeletal: Normal range of motion. He exhibits no edema and no tenderness.  Neurological: He is alert.  Alert, speech fluent. Oriented. Motor intact bil.   Skin: Skin is warm and dry. He is not diaphoretic.  Psychiatric:  Slow to respond. Then appropriately distraught when hearing news of family members death.     ED Course  Procedures (including critical care time) Labs Review  Results for orders placed during the hospital encounter of 11/28/12  GLUCOSE, CAPILLARY      Result Value Range   Glucose-Capillary 199 (*) 70 - 99 mg/dL  ETHANOL      Result Value Range   Alcohol, Ethyl (B) 48 (*) 0 - 11 mg/dL  CBC      Result Value Range   WBC 4.8  4.0 - 10.5 K/uL   RBC 6.21 (*) 4.22 - 5.81 MIL/uL   Hemoglobin 19.9 (*) 13.0 - 17.0 g/dL   HCT 96.0 (*) 45.4 - 09.8 %   MCV 87.8  78.0 - 100.0 fL   MCH 32.0  26.0 - 34.0 pg   MCHC 36.5 (*) 30.0 - 36.0 g/dL   RDW 11.9  14.7 - 82.9 %   Platelets 120 (*) 150 - 400 K/uL  COMPREHENSIVE METABOLIC PANEL      Result Value Range   Sodium 135  135 - 145 mEq/L   Potassium 4.2  3.5 - 5.1 mEq/L   Chloride 100  96 - 112 mEq/L   CO2 21  19 - 32 mEq/L   Glucose, Bld 179 (*) 70 - 99 mg/dL   BUN 8  6 - 23 mg/dL   Creatinine, Ser 5.62  0.50 - 1.35 mg/dL   Calcium 9.0  8.4 - 13.0 mg/dL   Total Protein 7.3  6.0 - 8.3 g/dL   Albumin 3.0 (*) 3.5 - 5.2 g/dL   AST 27  0 - 37 U/L   ALT 17  0 - 53 U/L   Alkaline Phosphatase 107  39 - 117 U/L   Total Bilirubin 0.5  0.3 - 1.2 mg/dL   GFR calc non Af Amer >90  >90 mL/min   GFR calc Af Amer >90  >90 mL/min     EKG Interpretation    None       MDM  Labs.  Reviewed nursing notes and prior charts for additional history.   Pt had not had his normal meds yet today,  bp high.  Pt given his normal bp med.  bp improved.  Pt denies any c/o on recheck and requests d/c. He is alert, oriented, ambulates in hall w steady gait.  Pt appears stable for d/c from ed.       Suzi Roots, MD 11/28/12 [phone]

## 2014-03-30 ENCOUNTER — Emergency Department (HOSPITAL_COMMUNITY)
Admission: EM | Admit: 2014-03-30 | Discharge: 2014-03-30 | Disposition: A | Discharge status: Home / Self Care | Payer: Self-pay | Attending: Emergency Medicine | Admitting: Emergency Medicine

## 2014-03-30 ENCOUNTER — Emergency Department (HOSPITAL_COMMUNITY): Payer: Self-pay

## 2014-03-30 ENCOUNTER — Encounter (HOSPITAL_COMMUNITY): Payer: Self-pay | Admitting: *Deleted

## 2014-03-30 DIAGNOSIS — Z8659 Personal history of other mental and behavioral disorders: Secondary | ICD-10-CM | POA: Insufficient documentation

## 2014-03-30 DIAGNOSIS — Z8719 Personal history of other diseases of the digestive system: Secondary | ICD-10-CM | POA: Insufficient documentation

## 2014-03-30 DIAGNOSIS — E119 Type 2 diabetes mellitus without complications: Secondary | ICD-10-CM | POA: Insufficient documentation

## 2014-03-30 DIAGNOSIS — Z720 Tobacco use: Secondary | ICD-10-CM | POA: Insufficient documentation

## 2014-03-30 DIAGNOSIS — G629 Polyneuropathy, unspecified: Secondary | ICD-10-CM | POA: Insufficient documentation

## 2014-03-30 DIAGNOSIS — I129 Hypertensive chronic kidney disease with stage 1 through stage 4 chronic kidney disease, or unspecified chronic kidney disease: Secondary | ICD-10-CM | POA: Insufficient documentation

## 2014-03-30 DIAGNOSIS — Z7982 Long term (current) use of aspirin: Secondary | ICD-10-CM | POA: Insufficient documentation

## 2014-03-30 DIAGNOSIS — N189 Chronic kidney disease, unspecified: Secondary | ICD-10-CM | POA: Insufficient documentation

## 2014-03-30 LAB — BASIC METABOLIC PANEL
Anion gap: 5 (ref 5–15)
BUN: <5 mg/dL — ABNORMAL LOW (ref 6–23)
CALCIUM: 8.7 mg/dL (ref 8.4–10.5)
CO2: 31 mmol/L (ref 19–32)
CREATININE: 1.28 mg/dL (ref 0.50–1.35)
Chloride: 98 mmol/L (ref 96–112)
GFR calc Af Amer: 73 mL/min — ABNORMAL LOW (ref >90)
GFR calc non Af Amer: 63 mL/min — ABNORMAL LOW (ref >90)
GLUCOSE: 115 mg/dL — AB (ref 70–99)
Potassium: 4.2 mmol/L (ref 3.5–5.1)
SODIUM: 134 mmol/L — AB (ref 135–145)

## 2014-03-30 LAB — CBC
HCT: 56.3 % — ABNORMAL HIGH (ref 39.0–52.0)
Hemoglobin: 19.6 g/dL — ABNORMAL HIGH (ref 13.0–17.0)
MCH: 30.9 pg (ref 26.0–34.0)
MCHC: 34.8 g/dL (ref 30.0–36.0)
MCV: 88.7 fL (ref 78.0–100.0)
PLATELETS: 147 10*3/uL — AB (ref 150–400)
RBC: 6.35 MIL/uL — ABNORMAL HIGH (ref 4.22–5.81)
RDW: 12.5 % (ref 11.5–15.5)
WBC: 5.2 10*3/uL (ref 4.0–10.5)

## 2014-03-30 LAB — I-STAT TROPONIN, ED: Troponin i, poc: 0 ng/mL (ref 0.00–0.08)

## 2014-03-30 MED ORDER — METFORMIN HCL 1000 MG PO TABS: 1000.0000 mg | ORAL_TABLET | Freq: Every day | ORAL | Status: DC

## 2014-03-30 MED ORDER — LISINOPRIL 10 MG PO TABS: 20.0000 mg | ORAL_TABLET | Freq: Every day | ORAL | Status: DC

## 2014-03-30 MED ORDER — GABAPENTIN 400 MG PO CAPS: 800.0000 mg | ORAL_CAPSULE | Freq: Two times a day (BID) | ORAL | Status: DC

## 2014-03-30 NOTE — ED Provider Notes (Signed)
CSN: [id number]     Arrival date & time 03/30/14  1650 History   First MD Initiated Contact with Patient 03/30/14 1844     Chief Complaint  Patient presents with  . Numbness     (Consider location/radiation/quality/duration/timing/severity/associated sxs/prior Treatment) HPI   PCP: Default, Provider, MD Blood pressure 131/72, pulse 76, temperature 97.8 F (36.6 C), resp. rate 21, weight 160 lb 9 oz (72.831 kg), SpO2 95 %.  Eric Morton is a 49 y.o.male with a significant PMH of hypertension, substance abuse, peripheral neuropathy, type II diabetes mellitus, alcohol liver damage, chronic kidney disease, anxiety, hx of stomach ulcers. Pt presents to the ER with complaints of last night having an episode of numbness that lasted 15 seconds in his 4th and 5th digits on the left hand. Today he had 2-3 episodes of 15-20 seconds of his left hand up till his elbow of numbness that resolved. This happened after moving a DVD player and manually pushing buttons to set it up. He reports being out of his Metformin, Gabapentin and Lisinopril for 1 week. He reports having symptoms similar to this in his feet previously which is typically treated with gabapentin. He has had no further episodes.  Negative Review of Symptoms: He denies lower extremity swelling, CP, confusion, shortness of breath, aphasia, dysphagia, ataxia, facial droop, generalized or focal weakness, confusion, change in vision, headache, neck pain, fevers, myalgias, any pain associated symptoms, abnormal pain, back pain.   Past Medical History  Diagnosis Date  . Hypertension   . Substance abuse   . Peripheral neuropathy   . Hypercholesterolemia   . Type II diabetes mellitus   . Alcohol liver damage     "just a little damage"  . Chronic kidney disease     "jsut a little damage"  . Anxiety   . History of stomach ulcers    Past Surgical History  Procedure Laterality Date  . No past surgeries    . Irrigation and debridement abscess   09/26/2011    Procedure: IRRIGATION AND DEBRIDEMENT ABSCESS;  Surgeon: Imogene Burn. Georgette Dover, MD;  Location: [facility];  Service: General;  Laterality: Left;  Incision and Drainage left breast abscess   History reviewed. No pertinent family history. History  Substance Use Topics  . Smoking status: Current Every Day Smoker -- 0.50 packs/day for 32 years    Types: Cigarettes  . Smokeless tobacco: Never Used  . Alcohol Use: 42.0 oz/week    70 Cans of beer per week     Comment: at least 120 oz of beer daily    Review of Systems 10 Systems reviewed and are negative for acute change except as noted in the HPI.      Allergies  Review of patient's allergies indicates no known allergies.  Home Medications   Prior to Admission medications   Medication Sig Start Date End Date Taking? Authorizing Provider  aspirin EC 325 MG tablet Take 325 mg by mouth daily.    Historical Provider, MD  gabapentin (NEURONTIN) 400 MG capsule Take 2 capsules (800 mg total) by mouth every 12 (twelve) hours. 03/30/14   Cheron Pasquarelli Marilu Favre, PA-C  lisinopril (PRINIVIL,ZESTRIL) 10 MG tablet Take 2 tablets (20 mg total) by mouth daily. 03/30/14   Linus Mako, PA-C  metFORMIN (GLUCOPHAGE) 1000 MG tablet Take 1 tablet (1,000 mg total) by mouth daily with breakfast. 03/30/14   Linus Mako, PA-C   BP 131/72 mmHg  Pulse 76  Temp(Src) 97.8 F (36.6 C)  Resp 21  Wt 160 lb 9 oz (72.831 kg)  SpO2 95% Physical Exam  Constitutional: He appears well-developed and well-nourished. No distress.  HENT:  Head: Normocephalic and atraumatic.  Eyes: Pupils are equal, round, and reactive to light.  Neck: Normal range of motion. Neck supple.  Cardiovascular: Normal rate and regular rhythm.   Pulmonary/Chest: Effort normal.  Abdominal: Soft.  Neurological: He is alert.  Cranial nerves II-VIII and X-XII evaluated and show no deficits. Pt alert and oriented x 3 Upper and lower extremity strength is symmetrical and physiologic Normal  muscular tone No facial droop Coordination intact, no limb ataxia, finger-nose-finger normal Rapid alternating movements normal No pronator drift   Skin: Skin is warm and dry.  Nursing note and vitals reviewed.   ED Course  Procedures (including critical care time) Labs Review Labs Reviewed  CBC - Abnormal; Notable for the following:    RBC 6.35 (*)    Hemoglobin 19.6 (*)    HCT 56.3 (*)    Platelets 147 (*)    All other components within normal limits  BASIC METABOLIC PANEL - Abnormal; Notable for the following:    Sodium 134 (*)    Glucose, Bld 115 (*)    BUN <5 (*)    GFR calc non Af Amer 63 (*)    GFR calc Af Amer 73 (*)    All other components within normal limits  I-STAT TROPOININ, ED    Imaging Review Dg Chest 2 View  03/30/2014   CLINICAL DATA:  Left hand and arm numbness today. Initial encounter.  EXAM: CHEST  2 VIEW  COMPARISON:  None.  FINDINGS: The heart size and mediastinal contours are normal. The lungs are clear. There is no pleural effusion or pneumothorax. No acute osseous findings are identified.  IMPRESSION: No active cardiopulmonary process.   Electronically Signed   By: Richardean Sale M.D.   On: 03/30/2014 19:17     EKG Interpretation None      MDM   Final diagnoses:  Peripheral neuropathy    The patient reports having symptoms in his arm x 3 episodes in his left hand (15-20 seconds each) that he has had previously  in his feet which is typically treated with gabapentin. I discussed the case with Dr. Alvino Chapel, we considered if symptoms are consistent with TIA symptoms, but they are very atypical and isolated, the night prior he had symptoms to his 4th and 5th digit only. He has had no further reoccurring symptoms and has been off of his gabapentin-  Pt is afebrile with no focal neuro deficits, nuchal rigidity, or change in vision. The patient denies any symptoms of neurological impairment or TIA's; no amaurosis, diplopia, dysphasia, or unilateral  disturbance of motor or sensory function. No loss of balance or vertigo.  I plan to restart his Gabapentin and home meds. Pt has been given VERY strict return to ED precautions. He currently is not hypertensive and denies use of cocaine. No chest pain. Pt is comfortable with the plan and will return if he has another episode of his hand going numb despite restarting his gabapentin.  49 y.o.Helane Rima Stetzel's evaluation in the Emergency Department is complete. It has been determined that no acute conditions requiring further emergency intervention are present at this time. The patient/guardian have been advised of the diagnosis and plan. We have discussed signs and symptoms that warrant return to the ED, such as changes or worsening in symptoms.  Vital signs are stable at discharge.  Filed Vitals:   03/30/14 1930  BP: 131/72  Pulse: 76  Temp:   Resp: 21    Patient/guardian has voiced understanding and agreed to follow-up with the PCP or specialist.      Linus Mako, PA-C 03/30/14 2016  Jasper Riling. Alvino Chapel, MD 03/31/14 1980

## 2014-03-30 NOTE — ED Notes (Signed)
Pt in c/o three episodes of left arm numbness PTA, states they lasted about 20-30 seconds each and then resolved, denies symptoms at this time, denies pain, no distress noted

## 2014-03-30 NOTE — Discharge Instructions (Signed)

## 2014-04-10 ENCOUNTER — Encounter (HOSPITAL_COMMUNITY): Payer: Self-pay | Admitting: *Deleted

## 2014-04-10 ENCOUNTER — Emergency Department (HOSPITAL_COMMUNITY): Payer: Self-pay

## 2014-04-10 ENCOUNTER — Emergency Department (HOSPITAL_COMMUNITY)
Admission: EM | Admit: 2014-04-10 | Discharge: 2014-04-10 | Disposition: A | Discharge status: Home / Self Care | Payer: Self-pay | Attending: Emergency Medicine | Admitting: Emergency Medicine

## 2014-04-10 DIAGNOSIS — N189 Chronic kidney disease, unspecified: Secondary | ICD-10-CM | POA: Insufficient documentation

## 2014-04-10 DIAGNOSIS — Z720 Tobacco use: Secondary | ICD-10-CM | POA: Insufficient documentation

## 2014-04-10 DIAGNOSIS — F419 Anxiety disorder, unspecified: Secondary | ICD-10-CM | POA: Insufficient documentation

## 2014-04-10 DIAGNOSIS — Z79899 Other long term (current) drug therapy: Secondary | ICD-10-CM | POA: Insufficient documentation

## 2014-04-10 DIAGNOSIS — R05 Cough: Secondary | ICD-10-CM | POA: Insufficient documentation

## 2014-04-10 DIAGNOSIS — E119 Type 2 diabetes mellitus without complications: Secondary | ICD-10-CM | POA: Insufficient documentation

## 2014-04-10 DIAGNOSIS — G629 Polyneuropathy, unspecified: Secondary | ICD-10-CM | POA: Insufficient documentation

## 2014-04-10 DIAGNOSIS — I129 Hypertensive chronic kidney disease with stage 1 through stage 4 chronic kidney disease, or unspecified chronic kidney disease: Secondary | ICD-10-CM | POA: Insufficient documentation

## 2014-04-10 DIAGNOSIS — K2970 Gastritis, unspecified, without bleeding: Secondary | ICD-10-CM | POA: Insufficient documentation

## 2014-04-10 LAB — COMPREHENSIVE METABOLIC PANEL
ALBUMIN: 2.3 g/dL — AB (ref 3.5–5.2)
ALT: 30 U/L (ref 0–53)
AST: 37 U/L (ref 0–37)
Alkaline Phosphatase: 120 U/L — ABNORMAL HIGH (ref 39–117)
Anion gap: 12 (ref 5–15)
BUN: <5 mg/dL — ABNORMAL LOW (ref 6–23)
CALCIUM: 8.5 mg/dL (ref 8.4–10.5)
CHLORIDE: 100 mmol/L (ref 96–112)
CO2: 23 mmol/L (ref 19–32)
Creatinine, Ser: 1.13 mg/dL (ref 0.50–1.35)
GFR calc Af Amer: 85 mL/min — ABNORMAL LOW (ref >90)
GFR, EST NON AFRICAN AMERICAN: 74 mL/min — AB (ref >90)
Glucose, Bld: 138 mg/dL — ABNORMAL HIGH (ref 70–99)
Potassium: 4.4 mmol/L (ref 3.5–5.1)
SODIUM: 135 mmol/L (ref 135–145)
Total Bilirubin: 1.2 mg/dL (ref 0.3–1.2)
Total Protein: 5.9 g/dL — ABNORMAL LOW (ref 6.0–8.3)

## 2014-04-10 LAB — TYPE AND SCREEN
ABO/RH(D): O POS
Antibody Screen: NEGATIVE

## 2014-04-10 LAB — CBC
HEMATOCRIT: 53.3 % — AB (ref 39.0–52.0)
Hemoglobin: 18.8 g/dL — ABNORMAL HIGH (ref 13.0–17.0)
MCH: 31.2 pg (ref 26.0–34.0)
MCHC: 35.3 g/dL (ref 30.0–36.0)
MCV: 88.4 fL (ref 78.0–100.0)
Platelets: 143 10*3/uL — ABNORMAL LOW (ref 150–400)
RBC: 6.03 MIL/uL — ABNORMAL HIGH (ref 4.22–5.81)
RDW: 12.4 % (ref 11.5–15.5)
WBC: 5.8 10*3/uL (ref 4.0–10.5)

## 2014-04-10 LAB — LIPASE, BLOOD: Lipase: 25 U/L (ref 11–59)

## 2014-04-10 LAB — CBG MONITORING, ED: GLUCOSE-CAPILLARY: 137 mg/dL — AB (ref 70–99)

## 2014-04-10 LAB — POC OCCULT BLOOD, ED: Fecal Occult Bld: NEGATIVE

## 2014-04-10 LAB — PROTIME-INR
INR: 1.05 (ref 0.00–1.49)
Prothrombin Time: 13.8 seconds (ref 11.6–15.2)

## 2014-04-10 LAB — ABO/RH: ABO/RH(D): O POS

## 2014-04-10 MED ORDER — LANSOPRAZOLE 30 MG PO CPDR: 30.0000 mg | DELAYED_RELEASE_CAPSULE | Freq: Every day | ORAL | Status: DC

## 2014-04-10 MED ORDER — PANTOPRAZOLE SODIUM 40 MG IV SOLR
40.0000 mg | Freq: Once | INTRAVENOUS | Status: AC
  Administered 2014-04-10: 40 mg via INTRAVENOUS
  Filled 2014-04-10: qty 40

## 2014-04-10 NOTE — ED Notes (Signed)
Reports intermittent dizziness.  EKG given to Dr. Vanita Panda.

## 2014-04-10 NOTE — ED Notes (Signed)
Pt given gingerale and crackers 

## 2014-04-10 NOTE — ED Provider Notes (Signed)
CSN: [id number]     Arrival date & time 04/10/14  1402 History   First MD Initiated Contact with Patient 04/10/14 1819     Chief Complaint  Patient presents with  . Abdominal Pain  . Rectal Bleeding     (Consider location/radiation/quality/duration/timing/severity/associated sxs/prior Treatment) HPI Comments: Patient history of alcohol abuse, hypertension and diabetes presents with diarrhea and blood in his stool. He states over the last 3-4 weeks she's had some loose stools primarily after he drinks beer. He states every time he drinks a beer he'll have 1-2 loose stools and then he'll take an Imodium annual go back to normal eventually drinks another beer when the loose stools come back again. Yesterday he had one episode of some blood in his stool. He's had no other episodes of bloody stools. He has some mild discomfort in his epigastrium which is typical for him but he denies any other abdominal pain. He does report a diminished appetite. He states that he didn't drink for 5 days and the diarrhea seemed to get a little bit better but then he drank 40 ounces beer and it started back again. He denies any history of rectal bleeding in the past. He does have a history of peptic ulcer disease but does not take medication for this. He was previously taking metformin for his diabetes but is been out of it for about 3 months. He states he's been checking his blood sugars at home and they've been ranging between 110 and 120. He denies any recent antibiotic usage. He does report a cough that's been going on for about a month.  Patient is a 49 y.o. male presenting with abdominal pain and hematochezia.  Abdominal Pain Associated symptoms: cough, diarrhea and hematochezia   Associated symptoms: no chest pain, no chills, no fatigue, no fever, no hematuria, no nausea, no shortness of breath and no vomiting   Rectal Bleeding Associated symptoms: abdominal pain   Associated symptoms: no dizziness, no fever and  no vomiting     Past Medical History  Diagnosis Date  . Hypertension   . Substance abuse   . Peripheral neuropathy   . Hypercholesterolemia   . Type II diabetes mellitus   . Alcohol liver damage     "just a little damage"  . Chronic kidney disease     "jsut a little damage"  . Anxiety   . History of stomach ulcers    Past Surgical History  Procedure Laterality Date  . No past surgeries    . Irrigation and debridement abscess  09/26/2011    Procedure: IRRIGATION AND DEBRIDEMENT ABSCESS;  Surgeon: Imogene Burn. Georgette Dover, MD;  Location: [place];  Service: General;  Laterality: Left;  Incision and Drainage left breast abscess   Family History  Problem Relation Age of Onset  . Diabetes Mother   . Hypertension Mother    History  Substance Use Topics  . Smoking status: Current Every Day Smoker -- 0.50 packs/day for 32 years    Types: Cigarettes  . Smokeless tobacco: Never Used  . Alcohol Use: 42.0 oz/week    70 Cans of beer per week     Comment: 3/14 quit drinking 5 days ago and then drank last nite    Review of Systems  Constitutional: Positive for appetite change. Negative for fever, chills, diaphoresis and fatigue.  HENT: Negative for congestion, rhinorrhea and sneezing.   Eyes: Negative.   Respiratory: Positive for cough. Negative for chest tightness and shortness of breath.  Cardiovascular: Negative for chest pain and leg swelling.  Gastrointestinal: Positive for abdominal pain, diarrhea and hematochezia. Negative for nausea, vomiting and blood in stool.  Genitourinary: Negative for frequency, hematuria, flank pain and difficulty urinating.  Musculoskeletal: Negative for back pain and arthralgias.  Skin: Negative for rash.  Neurological: Negative for dizziness, speech difficulty, weakness, numbness and headaches.      Allergies  Review of patient's allergies indicates no known allergies.  Home Medications   Prior to Admission medications   Medication Sig Start Date  End Date Taking? Authorizing Provider  gabapentin (NEURONTIN) 400 MG capsule Take 2 capsules (800 mg total) by mouth every 12 (twelve) hours. 03/30/14  Yes Tiffany Carlota Raspberry, PA-C  lisinopril (PRINIVIL,ZESTRIL) 10 MG tablet Take 2 tablets (20 mg total) by mouth daily. 03/30/14  Yes Tiffany Carlota Raspberry, PA-C  loperamide (IMODIUM A-D) 2 MG tablet Take 2 mg by mouth 4 (four) times daily as needed for diarrhea or loose stools.   Yes Historical Provider, MD  metFORMIN (GLUCOPHAGE) 1000 MG tablet Take 1 tablet (1,000 mg total) by mouth daily with breakfast. 03/30/14  Yes Tiffany Carlota Raspberry, PA-C  lansoprazole (PREVACID) 30 MG capsule Take 1 capsule (30 mg total) by mouth daily at 12 noon. 04/10/14   Malvin Johns, MD   BP 123/77 mmHg  Pulse 79  Temp(Src) 97.9 F (36.6 C) (Oral)  Resp 15  SpO2 100% Physical Exam  Constitutional: He is oriented to person, place, and time. He appears well-developed and well-nourished.  HENT:  Head: Normocephalic and atraumatic.  Mouth/Throat: Oropharynx is clear and moist.  Eyes: Pupils are equal, round, and reactive to light.  Neck: Normal range of motion. Neck supple.  Cardiovascular: Normal rate, regular rhythm and normal heart sounds.   Pulmonary/Chest: Effort normal and breath sounds normal. No respiratory distress. He has no wheezes. He has no rales. He exhibits no tenderness.  Abdominal: Soft. Bowel sounds are normal. There is tenderness (mild tenderness to the epigastrium). There is no rebound and no guarding.  Genitourinary:  No gross blood in his stool.  Musculoskeletal: Normal range of motion. He exhibits no edema.  Lymphadenopathy:    He has no cervical adenopathy.  Neurological: He is alert and oriented to person, place, and time.  Skin: Skin is warm and dry. No rash noted.  Psychiatric: He has a normal mood and affect.    ED Course  Procedures (including critical care time) Labs Review Labs Reviewed  CBC - Abnormal; Notable for the following:    RBC 6.03 (*)     Hemoglobin 18.8 (*)    HCT 53.3 (*)    Platelets 143 (*)    All other components within normal limits  COMPREHENSIVE METABOLIC PANEL - Abnormal; Notable for the following:    Glucose, Bld 138 (*)    BUN <5 (*)    Total Protein 5.9 (*)    Albumin 2.3 (*)    Alkaline Phosphatase 120 (*)    GFR calc non Af Amer 74 (*)    GFR calc Af Amer 85 (*)    All other components within normal limits  CBG MONITORING, ED - Abnormal; Notable for the following:    Glucose-Capillary 137 (*)    All other components within normal limits  CLOSTRIDIUM DIFFICILE BY PCR  PROTIME-INR  LIPASE, BLOOD  POC OCCULT BLOOD, ED  TYPE AND SCREEN  ABO/RH    Imaging Review No results found.   EKG Interpretation   Date/Time:  Monday April 10 2014 14:47:48 EDT Ventricular  Rate:  97 PR Interval:  150 QRS Duration: 80 QT Interval:  360 QTC Calculation: 457 R Axis:   59 Text Interpretation:  Normal sinus rhythm Septal infarct , age  undetermined Abnormal ECG ED PHYSICIAN INTERPRETATION AVAILABLE IN [place]  [place] Confirmed by TEST, Record ([id number]) on 04/12/2014 6:44:57 AM      MDM   Final diagnoses:  Gastritis    PT's hemoccult is heme neg.  He is feeling much better after protonix without ongoing abd pain.  Will start him on a PPI, advised him to try and avoid beer.  Pt to f/u with walk in clinic at [facility] and [facility].    Malvin Johns, MD 04/13/14 [phone]

## 2014-04-10 NOTE — ED Notes (Signed)
Pt states that he is here with diarrhea for the last three weeks, stopped drinking alcohol about 5 days ago.  Last nite started noticing bright red bleeding from rectum since about 4 am.  Not vomiting blood.  Pt has a bad feeling in his abdomen

## 2014-04-10 NOTE — Discharge Instructions (Signed)

## 2014-04-11 LAB — CLOSTRIDIUM DIFFICILE BY PCR: Toxigenic C. Difficile by PCR: NEGATIVE

## 2014-04-13 ENCOUNTER — Ambulatory Visit: Payer: Self-pay | Attending: Family Medicine | Admitting: Family Medicine

## 2014-04-13 VITALS — BP 130/79 | HR 74 | Temp 97.7°F | Resp 16 | Ht 67.0 in | Wt 160.0 lb

## 2014-04-13 DIAGNOSIS — K2970 Gastritis, unspecified, without bleeding: Secondary | ICD-10-CM | POA: Insufficient documentation

## 2014-04-13 DIAGNOSIS — E119 Type 2 diabetes mellitus without complications: Secondary | ICD-10-CM | POA: Insufficient documentation

## 2014-04-13 DIAGNOSIS — E1165 Type 2 diabetes mellitus with hyperglycemia: Secondary | ICD-10-CM

## 2014-04-13 LAB — POCT GLYCOSYLATED HEMOGLOBIN (HGB A1C): HEMOGLOBIN A1C: 6.5

## 2014-04-13 LAB — GLUCOSE, POCT (MANUAL RESULT ENTRY): POC Glucose: 74 mg/dl (ref 70–99)

## 2014-04-13 NOTE — Progress Notes (Deleted)
Patient ID: Eric Morton, male   DOB: 05/16/1965, 49 y.o.   MRN: [id number] [facility] Complaint: Hospital follow-up Subjective:  Patient presents for ED follow-up.  He went to ED and was diagnosed with gastritis and rectal bleeding and was prescribed Prevacid.  He is here today to fill his prescriptions and establish care.He has a history of diabetes with peripheral neuropathy. He has a history of polysubstance abuse including alcohol and cocaine. As a result he has liver damage.  ROS:  GEN:   Denies fever, chills,WT loss Skin:   Denies lesions or rashes HENT:   Denies  earache, epistaxis, sore throat, or neck pain, or headaches                LUNGS:  Denies SOB with rest or walking CV:   Denies CP or palpitations ABD:   Denies abdominal pain, nausea,and vomiting   Admits to epigastric pain and rectal bleeding           EXT:    Denies muscle spasms or swelling; no pain in lower ext, no weakness NEURO:   Denies, denies sz, Hx of stroke.   Admits to tingling and numbness of the feet. Is on gabapentin   Objective:  Filed Vitals:   04/13/14 0943  BP: 130/79  Pulse: 74  Temp: 97.7 F (36.5 C)  Resp: 16  Height: 5\' 7"  (1.702 m)  Weight: 160 lb (72.576 kg)  SpO2: 100%    Physical Exam:  General:  in no acute distress. HEENT:  no pallor, no icterus, moist oral mucosa, no  lymphadenopathy Heart:   Normal  s1 &s2  Regular rate and rhythm, without M,G,R Lungs:   Clear to auscultation bilaterally. Abdomen:   nondistended, positive bowel sounds. There is epigastric tenderness present. Exetremeties:  No pedal edema,pedal pulses normal. Neuro:   Alert, awake, oriented x3, nonfocal.  Pertinent Lab Results:   Medications: Prior to Admission medications   Medication Sig Start Date End Date Taking? Authorizing Provider  gabapentin (NEURONTIN) 400 MG capsule Take 2 capsules (800 mg total) by mouth every 12 (twelve) hours. 03/30/14  Yes Tiffany Carlota Raspberry, PA-C  lansoprazole (PREVACID) 30 MG  capsule Take 1 capsule (30 mg total) by mouth daily at 12 noon. 04/10/14  Yes Malvin Johns, MD  lisinopril (PRINIVIL,ZESTRIL) 10 MG tablet Take 2 tablets (20 mg total) by mouth daily. 03/30/14  Yes Tiffany Carlota Raspberry, PA-C  loperamide (IMODIUM A-D) 2 MG tablet Take 2 mg by mouth 4 (four) times daily as needed for diarrhea or loose stools.   Yes Historical Provider, MD  metFORMIN (GLUCOPHAGE) 1000 MG tablet Take 1 tablet (1,000 mg total) by mouth daily with breakfast. 03/30/14  Yes Delos Haring, PA-C    Assessment: 1. Gastritis .2. diabetes    Plan: 1. Fill prescription for Prevacid. I have asked him to avoid alcohol.     Follow-up in one month. Sooner for problems. 2. Follow-up with PCP here.  Follow up:  The patient was given clear instructions to go to ER or return to medical center if symptoms don't improve, worsen or new problems develop. The patient verbalized understanding. The patient was told to call to get lab results if they haven't heard anything in the next week.   This note has been created with Surveyor, quantity. Any transcriptional errors are unintentional.   Micheline Chapman, FNP,BC 04/13/2014, 3:30 PM

## 2014-04-13 NOTE — Progress Notes (Deleted)
Patient ID: Eric Morton, male   DOB: 1965/11/19, 49 y.o.   MRN: [id number] [facility] Complaint: Hospital follow-up Subjective:  Patient presents for ED follow-up.  He went to ED and was diagnosed with gastritis and rectal bleeding and was prescribed Prevacid.  He is here today to fill his prescriptions and establish care.He has a history of diabetes with peripheral neuropathy. He has a history of polysubstance abuse including alcohol and cocaine. As a result he has liver damage.  ROS:  GEN:   Denies fever, chills,WT loss Skin:   Denies lesions or rashes HENT:   Denies  earache, epistaxis, sore throat, or neck pain, or headaches                LUNGS:  Denies SOB with rest or walking CV:   Denies CP or palpitations ABD:   Denies abdominal pain, nausea,and vomiting   Admits to epigastric pain and rectal bleeding           EXT:    Denies muscle spasms or swelling; no pain in lower ext, no weakness NEURO:   Denies, denies sz, Hx of stroke.   Admits to tingling and numbness of the feet. Is on gabapentin   Objective:  Filed Vitals:   04/13/14 0943  BP: 130/79  Pulse: 74  Temp: 97.7 F (36.5 C)  Resp: 16  Height: 5\' 7"  (1.702 m)  Weight: 160 lb (72.576 kg)  SpO2: 100%    Physical Exam:  General:  in no acute distress. HEENT:  no pallor, no icterus, moist oral mucosa, no  lymphadenopathy Heart:   Normal  s1 &s2  Regular rate and rhythm, without M,G,R Lungs:   Clear to auscultation bilaterally. Abdomen:   nondistended, positive bowel sounds. There is epigastric tenderness present. Exetremeties:  No pedal edema,pedal pulses normal. Neuro:   Alert, awake, oriented x3, nonfocal.  Pertinent Lab Results:   Medications: Prior to Admission medications   Medication Sig Start Date End Date Taking? Authorizing Provider  gabapentin (NEURONTIN) 400 MG capsule Take 2 capsules (800 mg total) by mouth every 12 (twelve) hours. 03/30/14  Yes Tiffany Carlota Raspberry, PA-C  lansoprazole (PREVACID) 30 MG  capsule Take 1 capsule (30 mg total) by mouth daily at 12 noon. 04/10/14  Yes Malvin Johns, MD  lisinopril (PRINIVIL,ZESTRIL) 10 MG tablet Take 2 tablets (20 mg total) by mouth daily. 03/30/14  Yes Tiffany Carlota Raspberry, PA-C  loperamide (IMODIUM A-D) 2 MG tablet Take 2 mg by mouth 4 (four) times daily as needed for diarrhea or loose stools.   Yes Historical Provider, MD  metFORMIN (GLUCOPHAGE) 1000 MG tablet Take 1 tablet (1,000 mg total) by mouth daily with breakfast. 03/30/14  Yes Delos Haring, PA-C    Assessment: 1. Gastritis .2. diabetes    Plan: 1. Fill prescription for Prevacid. I have asked him to avoid alcohol.     Follow-up in one month. Sooner for problems. 2. Follow-up with PCP here.  Follow up:  The patient was given clear instructions to go to ER or return to medical center if symptoms don't improve, worsen or new problems develop. The patient verbalized understanding. The patient was told to call to get lab results if they haven't heard anything in the next week.   This note has been created with Surveyor, quantity. Any transcriptional errors are unintentional.   Micheline Chapman, FNP,BC 04/13/2014, 3:30 PM

## 2014-04-13 NOTE — Progress Notes (Signed)
Patient ID: Eric Morton, male   DOB: [birth year], 49 y.o.   MRN: [id number]   Assessment: 1. Gastritis .2. diabetes    Plan: 1. Fill prescription for Prevacid. I have asked him to avoid alcohol.     Follow-up in one month. Sooner for problems. 2. Follow-up with PCP here.  Follow up:  The patient was given clear instructions to go to ER or return to medical center if symptoms don't improve, worsen or new problems develop. The patient verbalized understanding. The patient was told to call to get lab results if they haven't heard anything in the next week.   This note has been created with Surveyor, quantity. Any transcriptional errors are unintentional.   Micheline Chapman, FNP,BC 04/13/2014, 3:30 PM

## 2014-04-13 NOTE — Progress Notes (Signed)
Patient ID: Eric Morton, male   DOB: 1965-08-28, 49 y.o.   MRN: [id number] .

## 2014-04-13 NOTE — Assessment & Plan Note (Signed)
   Assessment: 1. Gastritis .2. diabetes    Plan: 1. Fill prescription for Prevacid. I have asked him to avoid alcohol.     Follow-up in one month. Sooner for problems. 2. Follow-up with PCP here.  Follow up:  The patient was given clear instructions to go to ER or return to medical center if symptoms don't improve, worsen or new problems develop. The patient verbalized understanding. The patient was told to call to get lab results if they haven't heard anything in the next week.   This note has been created with Surveyor, quantity. Any transcriptional errors are unintentional.   Micheline Chapman, FNP,BC 04/13/2014, 3:30 PM

## 2014-04-13 NOTE — Patient Instructions (Addendum)
Pick up medicatiions at the pharmacy that were order by hospital. Take medication for stomach daily  Make a visit here with primary doctor for a week before you run out of medications

## 2014-04-13 NOTE — Progress Notes (Deleted)
Patient ID: Eric Morton, male   DOB: November 03, 1965, 49 y.o.   MRN: [id number] [facility] Complaint: Hospital follow-up Subjective:  Patient presents for ED follow-up.  He went to ED and was diagnosed with gastritis and rectal bleeding and was prescribed Prevacid.  He is here today to fill his prescriptions and establish care.He has a history of diabetes with peripheral neuropathy. He has a history of polysubstance abuse including alcohol and cocaine. As a result he has liver damage.  ROS:  GEN:   Denies fever, chills,WT loss Skin:   Denies lesions or rashes HENT:   Denies  earache, epistaxis, sore throat, or neck pain, or headaches                LUNGS:  Denies SOB with rest or walking CV:   Denies CP or palpitations ABD:   Denies abdominal pain, nausea,and vomiting   Admits to epigastric pain and rectal bleeding           EXT:    Denies muscle spasms or swelling; no pain in lower ext, no weakness NEURO:   Denies, denies sz, Hx of stroke.   Admits to tingling and numbness of the feet. Is on gabapentin   Objective:  Filed Vitals:   04/13/14 0943  BP: 130/79  Pulse: 74  Temp: 97.7 F (36.5 C)  Resp: 16  Height: 5\' 7"  (1.702 m)  Weight: 160 lb (72.576 kg)  SpO2: 100%    Physical Exam:  General:  in no acute distress. HEENT:  no pallor, no icterus, moist oral mucosa, no  lymphadenopathy Heart:   Normal  s1 &s2  Regular rate and rhythm, without M,G,R Lungs:   Clear to auscultation bilaterally. Abdomen:   nondistended, positive bowel sounds. There is epigastric tenderness present. Exetremeties:  No pedal edema,pedal pulses normal. Neuro:   Alert, awake, oriented x3, nonfocal.  Pertinent Lab Results:   Medications: Prior to Admission medications   Medication Sig Start Date End Date Taking? Authorizing Provider  gabapentin (NEURONTIN) 400 MG capsule Take 2 capsules (800 mg total) by mouth every 12 (twelve) hours. 03/30/14  Yes Tiffany Carlota Raspberry, PA-C  lansoprazole (PREVACID) 30 MG  capsule Take 1 capsule (30 mg total) by mouth daily at 12 noon. 04/10/14  Yes Malvin Johns, MD  lisinopril (PRINIVIL,ZESTRIL) 10 MG tablet Take 2 tablets (20 mg total) by mouth daily. 03/30/14  Yes Tiffany Carlota Raspberry, PA-C  loperamide (IMODIUM A-D) 2 MG tablet Take 2 mg by mouth 4 (four) times daily as needed for diarrhea or loose stools.   Yes Historical Provider, MD  metFORMIN (GLUCOPHAGE) 1000 MG tablet Take 1 tablet (1,000 mg total) by mouth daily with breakfast. 03/30/14  Yes Delos Haring, PA-C    Assessment: 1. Gastritis .2. diabetes    Plan: 1. Fill prescription for Prevacid. I have asked him to avoid alcohol.     Follow-up in one month. Sooner for problems. 2. Follow-up with PCP here.  Follow up:  The patient was given clear instructions to go to ER or return to medical center if symptoms don't improve, worsen or new problems develop. The patient verbalized understanding. The patient was told to call to get lab results if they haven't heard anything in the next week.   This note has been created with Surveyor, quantity. Any transcriptional errors are unintentional.   Micheline Chapman, FNP,BC 04/13/2014, 2:24 PM

## 2014-04-13 NOTE — Progress Notes (Signed)
Patient history of alcohol abuse, hypertension and diabetes presents with diarrhea and blood in his stool. He states over the last 3-4 weeks she's had some loose stools primarily after he drinks beer. He states every time he drinks a beer he'll have 1-2 loose stools and then he'll take an Imodium annual go back to normal eventually drinks another beer when the loose stools come back again. Yesterday he had one episode of some blood in his stool. He's had no other episodes of bloody stools. He has some mild discomfort in his epigastrium which is typical for him but he denies any other abdominal pain. He does report a diminished appetite. He states that he didn't drink for 5 days and the diarrhea seemed to get a little bit better but then he drank 40 ounces beer and it started back again. He denies any history of rectal bleeding in the past. He does have a history of peptic ulcer disease but does not take medication for this. He was previously taking metformin for his diabetes but is been out of it for about 3 months. He states he's been checking his blood sugars at home and they've been ranging between 110 and 120. He denies any recent antibiotic usage. He does report a cough that's been going on for about a month.   Patient was drinking 4 40oz beers a day.  He would have epigastric pain and bloody diarrhea .  Patient took Imodium and his stools would be more formed.  As soon as he started Drinking again and the diarrhea would start again.

## 2014-04-25 ENCOUNTER — Emergency Department (HOSPITAL_COMMUNITY): Payer: Self-pay

## 2014-04-25 ENCOUNTER — Encounter (HOSPITAL_COMMUNITY): Payer: Self-pay | Admitting: Emergency Medicine

## 2014-04-25 ENCOUNTER — Inpatient Hospital Stay (HOSPITAL_COMMUNITY)
Admission: EM | Admit: 2014-04-25 | Discharge: 2014-04-28 | DRG: 394 | Disposition: A | Discharge status: Home / Self Care | Payer: Self-pay | Attending: Family Medicine | Admitting: Family Medicine

## 2014-04-25 DIAGNOSIS — A09 Infectious gastroenteritis and colitis, unspecified: Secondary | ICD-10-CM

## 2014-04-25 DIAGNOSIS — K5730 Diverticulosis of large intestine without perforation or abscess without bleeding: Secondary | ICD-10-CM | POA: Diagnosis present

## 2014-04-25 DIAGNOSIS — R197 Diarrhea, unspecified: Secondary | ICD-10-CM

## 2014-04-25 DIAGNOSIS — F10239 Alcohol dependence with withdrawal, unspecified: Secondary | ICD-10-CM | POA: Diagnosis present

## 2014-04-25 DIAGNOSIS — F419 Anxiety disorder, unspecified: Secondary | ICD-10-CM | POA: Diagnosis present

## 2014-04-25 DIAGNOSIS — E440 Moderate protein-calorie malnutrition: Secondary | ICD-10-CM | POA: Diagnosis present

## 2014-04-25 DIAGNOSIS — Z860101 Personal history of adenomatous and serrated colon polyps: Secondary | ICD-10-CM | POA: Insufficient documentation

## 2014-04-25 DIAGNOSIS — Z86010 Personal history of colonic polyps: Secondary | ICD-10-CM | POA: Insufficient documentation

## 2014-04-25 DIAGNOSIS — N189 Chronic kidney disease, unspecified: Secondary | ICD-10-CM | POA: Diagnosis present

## 2014-04-25 DIAGNOSIS — R32 Unspecified urinary incontinence: Secondary | ICD-10-CM | POA: Diagnosis present

## 2014-04-25 DIAGNOSIS — E1140 Type 2 diabetes mellitus with diabetic neuropathy, unspecified: Secondary | ICD-10-CM | POA: Diagnosis present

## 2014-04-25 DIAGNOSIS — E8809 Other disorders of plasma-protein metabolism, not elsewhere classified: Secondary | ICD-10-CM | POA: Diagnosis present

## 2014-04-25 DIAGNOSIS — E876 Hypokalemia: Secondary | ICD-10-CM | POA: Diagnosis present

## 2014-04-25 DIAGNOSIS — K529 Noninfective gastroenteritis and colitis, unspecified: Secondary | ICD-10-CM | POA: Diagnosis present

## 2014-04-25 DIAGNOSIS — F1410 Cocaine abuse, uncomplicated: Secondary | ICD-10-CM | POA: Diagnosis present

## 2014-04-25 DIAGNOSIS — F17210 Nicotine dependence, cigarettes, uncomplicated: Secondary | ICD-10-CM | POA: Diagnosis present

## 2014-04-25 DIAGNOSIS — E46 Unspecified protein-calorie malnutrition: Secondary | ICD-10-CM

## 2014-04-25 DIAGNOSIS — E780 Pure hypercholesterolemia: Secondary | ICD-10-CM | POA: Diagnosis present

## 2014-04-25 DIAGNOSIS — E860 Dehydration: Secondary | ICD-10-CM

## 2014-04-25 DIAGNOSIS — Z8711 Personal history of peptic ulcer disease: Secondary | ICD-10-CM

## 2014-04-25 DIAGNOSIS — Z9119 Patient's noncompliance with other medical treatment and regimen: Secondary | ICD-10-CM | POA: Diagnosis present

## 2014-04-25 DIAGNOSIS — D45 Polycythemia vera: Secondary | ICD-10-CM | POA: Diagnosis present

## 2014-04-25 DIAGNOSIS — D125 Benign neoplasm of sigmoid colon: Principal | ICD-10-CM | POA: Diagnosis present

## 2014-04-25 DIAGNOSIS — Z6824 Body mass index (BMI) 24.0-24.9, adult: Secondary | ICD-10-CM

## 2014-04-25 DIAGNOSIS — I129 Hypertensive chronic kidney disease with stage 1 through stage 4 chronic kidney disease, or unspecified chronic kidney disease: Secondary | ICD-10-CM | POA: Diagnosis present

## 2014-04-25 HISTORY — DX: Polycythemia vera: D45

## 2014-04-25 LAB — COMPREHENSIVE METABOLIC PANEL
ALBUMIN: 2.4 g/dL — AB (ref 3.5–5.2)
ALT: 40 U/L (ref 0–53)
ANION GAP: 7 (ref 5–15)
AST: 33 U/L (ref 0–37)
Alkaline Phosphatase: 145 U/L — ABNORMAL HIGH (ref 39–117)
BILIRUBIN TOTAL: 0.7 mg/dL (ref 0.3–1.2)
BUN: 6 mg/dL (ref 6–23)
CALCIUM: 8.4 mg/dL (ref 8.4–10.5)
CO2: 26 mmol/L (ref 19–32)
CREATININE: 0.93 mg/dL (ref 0.50–1.35)
Chloride: 101 mmol/L (ref 96–112)
GFR calc non Af Amer: >90 mL/min (ref >90)
Glucose, Bld: 99 mg/dL (ref 70–99)
Potassium: 3.7 mmol/L (ref 3.5–5.1)
Sodium: 134 mmol/L — ABNORMAL LOW (ref 135–145)
Total Protein: 6.2 g/dL (ref 6.0–8.3)

## 2014-04-25 LAB — URINALYSIS, ROUTINE W REFLEX MICROSCOPIC
GLUCOSE, UA: NEGATIVE mg/dL
Hgb urine dipstick: NEGATIVE
Ketones, ur: 15 mg/dL — AB
LEUKOCYTES UA: NEGATIVE
NITRITE: NEGATIVE
PH: 5.5 (ref 5.0–8.0)
PROTEIN: 30 mg/dL — AB
Specific Gravity, Urine: 1.025 (ref 1.005–1.030)
Urobilinogen, UA: 1 mg/dL (ref 0.0–1.0)

## 2014-04-25 LAB — URINE MICROSCOPIC-ADD ON

## 2014-04-25 LAB — CBC WITH DIFFERENTIAL/PLATELET
BASOS ABS: 0 10*3/uL (ref 0.0–0.1)
Basophils Relative: 1 % (ref 0–1)
Eosinophils Absolute: 0.2 10*3/uL (ref 0.0–0.7)
Eosinophils Relative: 3 % (ref 0–5)
HCT: 47.5 % (ref 39.0–52.0)
Hemoglobin: 16.3 g/dL (ref 13.0–17.0)
Lymphocytes Relative: 31 % (ref 12–46)
Lymphs Abs: 2.6 10*3/uL (ref 0.7–4.0)
MCH: 29.9 pg (ref 26.0–34.0)
MCHC: 34.3 g/dL (ref 30.0–36.0)
MCV: 87 fL (ref 78.0–100.0)
Monocytes Absolute: 0.6 10*3/uL (ref 0.1–1.0)
Monocytes Relative: 7 % (ref 3–12)
NEUTROS ABS: 4.9 10*3/uL (ref 1.7–7.7)
Neutrophils Relative %: 58 % (ref 43–77)
Platelets: 235 10*3/uL (ref 150–400)
RBC: 5.46 MIL/uL (ref 4.22–5.81)
RDW: 12.5 % (ref 11.5–15.5)
WBC: 8.3 10*3/uL (ref 4.0–10.5)

## 2014-04-25 LAB — ETHANOL: Alcohol, Ethyl (B): <5 mg/dL (ref 0–9)

## 2014-04-25 LAB — LIPASE, BLOOD: LIPASE: 18 U/L (ref 11–59)

## 2014-04-25 LAB — RAPID URINE DRUG SCREEN, HOSP PERFORMED
AMPHETAMINES: NOT DETECTED
BARBITURATES: NOT DETECTED
Benzodiazepines: NOT DETECTED
Cocaine: POSITIVE — AB
Opiates: NOT DETECTED
TETRAHYDROCANNABINOL: POSITIVE — AB

## 2014-04-25 LAB — POC OCCULT BLOOD, ED: FECAL OCCULT BLD: POSITIVE — AB

## 2014-04-25 MED ORDER — LORAZEPAM 1 MG PO TABS: 1.0000 mg | ORAL_TABLET | Freq: Four times a day (QID) | ORAL | Status: DC | PRN

## 2014-04-25 MED ORDER — SODIUM CHLORIDE 0.9 % IV BOLUS (SEPSIS): 1000.0000 mL | Freq: Once | INTRAVENOUS | Status: DC

## 2014-04-25 MED ORDER — ACETAMINOPHEN 325 MG PO TABS
650.0000 mg | ORAL_TABLET | Freq: Four times a day (QID) | ORAL | Status: DC | PRN
Start: 2014-04-25 — End: 2014-04-28

## 2014-04-25 MED ORDER — METRONIDAZOLE IN NACL 5-0.79 MG/ML-% IV SOLN
500.0000 mg | Freq: Three times a day (TID) | INTRAVENOUS | Status: DC
  Administered 2014-04-26: 500 mg via INTRAVENOUS
  Filled 2014-04-25 (×3): qty 100

## 2014-04-25 MED ORDER — SODIUM CHLORIDE 0.9 % IV BOLUS (SEPSIS)
1000.0000 mL | Freq: Once | INTRAVENOUS | Status: AC
  Administered 2014-04-25: 1000 mL via INTRAVENOUS

## 2014-04-25 MED ORDER — IOHEXOL 300 MG/ML  SOLN
100.0000 mL | Freq: Once | INTRAMUSCULAR | Status: AC | PRN
  Administered 2014-04-25: 100 mL via INTRAVENOUS

## 2014-04-25 MED ORDER — LISINOPRIL 20 MG PO TABS
20.0000 mg | ORAL_TABLET | Freq: Every day | ORAL | Status: DC
  Administered 2014-04-26 – 2014-04-27 (×2): 20 mg via ORAL
  Filled 2014-04-25 (×3): qty 1

## 2014-04-25 MED ORDER — INSULIN ASPART 100 UNIT/ML ~~LOC~~ SOLN
0.0000 [IU] | Freq: Three times a day (TID) | SUBCUTANEOUS | Status: DC
  Administered 2014-04-26: 8 [IU] via SUBCUTANEOUS
  Administered 2014-04-27 (×2): 2 [IU] via SUBCUTANEOUS

## 2014-04-25 MED ORDER — CETYLPYRIDINIUM CHLORIDE 0.05 % MT LIQD: 7.0000 mL | Freq: Two times a day (BID) | OROMUCOSAL | Status: DC

## 2014-04-25 MED ORDER — IOHEXOL 300 MG/ML  SOLN
25.0000 mL | Freq: Once | INTRAMUSCULAR | Status: AC | PRN
  Administered 2014-04-25: 25 mL via ORAL

## 2014-04-25 MED ORDER — CIPROFLOXACIN IN D5W 400 MG/200ML IV SOLN
400.0000 mg | Freq: Two times a day (BID) | INTRAVENOUS | Status: DC
  Administered 2014-04-25 – 2014-04-26 (×2): 400 mg via INTRAVENOUS
  Filled 2014-04-25 (×3): qty 200

## 2014-04-25 MED ORDER — METRONIDAZOLE IVPB CUSTOM
1.0000 g | Freq: Once | INTRAVENOUS | Status: AC
  Administered 2014-04-25: 1 g via INTRAVENOUS
  Filled 2014-04-25: qty 200

## 2014-04-25 MED ORDER — SODIUM CHLORIDE 0.9 % IV SOLN
INTRAVENOUS | Status: DC
  Administered 2014-04-25 – 2014-04-26 (×2): via INTRAVENOUS

## 2014-04-25 MED ORDER — BOOST / RESOURCE BREEZE PO LIQD
1.0000 | Freq: Three times a day (TID) | ORAL | Status: DC
  Administered 2014-04-26 – 2014-04-27 (×4): 1 via ORAL

## 2014-04-25 MED ORDER — CHLORHEXIDINE GLUCONATE 0.12 % MT SOLN
15.0000 mL | Freq: Two times a day (BID) | OROMUCOSAL | Status: DC
  Administered 2014-04-26: 15 mL via OROMUCOSAL
  Filled 2014-04-25 (×7): qty 15

## 2014-04-25 MED ORDER — VITAMIN B-1 100 MG PO TABS
100.0000 mg | ORAL_TABLET | Freq: Every day | ORAL | Status: DC
  Administered 2014-04-25 – 2014-04-28 (×4): 100 mg via ORAL
  Filled 2014-04-25 (×4): qty 1

## 2014-04-25 MED ORDER — MORPHINE SULFATE 2 MG/ML IJ SOLN: 2.0000 mg | INTRAMUSCULAR | Status: DC | PRN

## 2014-04-25 MED ORDER — BOOST / RESOURCE BREEZE PO LIQD: 1.0000 | Freq: Three times a day (TID) | ORAL | Status: DC

## 2014-04-25 MED ORDER — GABAPENTIN 400 MG PO CAPS
800.0000 mg | ORAL_CAPSULE | Freq: Two times a day (BID) | ORAL | Status: DC
  Administered 2014-04-26 – 2014-04-28 (×5): 800 mg via ORAL
  Filled 2014-04-25 (×7): qty 2

## 2014-04-25 MED ORDER — FOLIC ACID 1 MG PO TABS
1.0000 mg | ORAL_TABLET | Freq: Every day | ORAL | Status: DC
  Administered 2014-04-25 – 2014-04-28 (×4): 1 mg via ORAL
  Filled 2014-04-25 (×4): qty 1

## 2014-04-25 MED ORDER — PANTOPRAZOLE SODIUM 40 MG IV SOLR
40.0000 mg | Freq: Two times a day (BID) | INTRAVENOUS | Status: DC
  Administered 2014-04-26: 40 mg via INTRAVENOUS
  Filled 2014-04-25 (×2): qty 40

## 2014-04-25 MED ORDER — LORAZEPAM 2 MG/ML IJ SOLN: 1.0000 mg | Freq: Four times a day (QID) | INTRAMUSCULAR | Status: DC | PRN

## 2014-04-25 MED ORDER — ADULT MULTIVITAMIN W/MINERALS CH
1.0000 | ORAL_TABLET | Freq: Every day | ORAL | Status: DC
  Administered 2014-04-25 – 2014-04-28 (×4): 1 via ORAL
  Filled 2014-04-25 (×4): qty 1

## 2014-04-25 MED ORDER — ACETAMINOPHEN 650 MG RE SUPP: 650.0000 mg | Freq: Four times a day (QID) | RECTAL | Status: DC | PRN

## 2014-04-25 MED ORDER — THIAMINE HCL 100 MG/ML IJ SOLN
100.0000 mg | Freq: Every day | INTRAMUSCULAR | Status: DC
  Filled 2014-04-25 (×3): qty 1

## 2014-04-25 MED ORDER — PANTOPRAZOLE SODIUM 40 MG IV SOLR
40.0000 mg | Freq: Once | INTRAVENOUS | Status: AC
  Administered 2014-04-25: 40 mg via INTRAVENOUS
  Filled 2014-04-25: qty 40

## 2014-04-25 NOTE — Progress Notes (Signed)
Pt admitted to [place] from ED. Pt is A&Ox4. Pt lives at home alone. Pt's skin is warm, dry and intact. Pt oriented to unit and room. Will continue to monitor pt. Ranelle Oyster, RN

## 2014-04-25 NOTE — Progress Notes (Signed)
Notified MD oncall that pt requesting saltines with his beef broth. Pt requesting antidiarrheal medication. MD stated no saltines only clear liquid diet and no antidiarrheal medication can be ordered at this time. MD stated he would put in order for cdiff pcr. Will continue to monitor pt. Ranelle Oyster, RN

## 2014-04-25 NOTE — ED Provider Notes (Addendum)
[place] of diffuse crampy abdominal pain for 1.5 months with multiple episodes of diarrhea, and incontinence of stool.Orlie Dakin, MD 04/25/14 9211  Orlie Dakin, MD 04/25/14 2100

## 2014-04-25 NOTE — ED Notes (Signed)
Lab will add-on lipase 

## 2014-04-25 NOTE — H&P (Signed)
[facility] Admission History and Physical Service Pager: [phone]  Patient name: Eric Morton Medical record number: [id number] Date of birth: 1965-11-03 Age: 49 y.o. Gender: male  Primary Care Provider: Default, Provider, MD Consultants: None Code Status: Full  Chief Complaint: Bloody Diarrhea and Vomiting.   Assessment and Plan: Eric Morton is a 49 y.o. male presenting with vomiting and bloody diarrhea . PMH is significant for HTN, DMII, ETOH abuse, Cocaine Abuse, Tobacco Abuse, Neuropathy.   Bloody Diarrhea: Pt. Here with persistent diarrhea for > 2 months and recently bloody diarrhea starting two weeks ago. He has had 4 "jelly" bloody stools while in the ED.   > 4 watery stools / day, associated with two episodes of recent vomiting that patient reports was bloody, otherwise he has been eating and drinking well. He denies any recent travel, sick contacts, antibiotic use, consumption of unprepared meat, fever or chills. he does admit to moderate abdominal pain at home. He had been using loperamide and pepcid for symptom relief for at least for the past two weeks. He denies IBD history. He has not had a screening colonoscopy, he denies family history of cancer. He has never had this before. Cocaine abuse. Exam with diffuse tenderness with more focused pain over the LLQ. Laboratory analysis with hypoalbuminemia, Lipase 18, ALP 145, Normal electrolytes, Hgb 16, normal WBC, Cocaine positive. FOBT positive. CT abd with diffuse wall thickening consistent with Infectious vs. Inflammatory disease at the sigmoid colon. Given the context may be infectious colitis vs. Colitis with upper GIB given history vs. Chronic ischemic colitis with known cocaine abuse and watershed location of CT findings.  - Admit to med-surg under Dr. Andria Frames - Stool culture - C.Diff pcr - NS bolus and then MIVF - Clear liquids for now.  - Cipro / Flagyl empirically for now.  - Morphine for pain  prn.  - f/u AM CMP and CBC - History of polysubstance abuse and risk factors. Get HIV, Hep panel - Mg, Phos levels, Lactate and LDH - Lipase normal, ALP elevated 145 likely related to vomiting. Trend CMP.  - Will need a colonoscopy in the future either for screening or to assist in this workup if indicated.   Vomiting: Pt. With vomiting two times prior to admission. He reports bloody vomitus, and epigastric pain for which he was taking pepcid. He says that he had been drinking frequently in January but had to slow down due to abdominal pain. Never had an endoscopy. No history of ulceration. Hgb stable at 16. WBC normal. Nausea resolved at this time. Likely related to the above problem.  - PPI BID.  - Clear liquids for now.  - Trend CBC - Zofran prn for nausea.   Polysubstance Abuse: Pt. With known ETOH and Cocaine abuse. Positive for cocaine and THC on UDS here. ETOH lvl < 5. Reports his last drink was 3 days ago and that he has been cutting back on his drinking 2/2 abdominal pain and the above problems. He does get somewhat agitated when asked about his drinking. Denied drug abuse on admission. Also with tobacco abuse.  - CIWA for ETOH withdrawal.  - Will discuss frequency and route of cocaine use at next visit.  - Does not want nicotine patch at this time.   Hypoalbuminemia: Pt with albumin 2.4 likely related to persistent - chronic diarrhea. Other synthetic liver function appears normal at this time. Some protein in his urine, however no overt kidney disease on laboratory  analysis. Will rehydrate and continue to follow. Most likely related to GI protein loss.  - Nutritional supplementation when diet advances.  - Will recheck as indicated, or suggest for follow up.   DMII - Pt. With A1C of 6.5 recently. On metformin 1000mg  daily. Glucose 99 on admission. Neuropathy historically.  - Trend CBC's.  - Sensitive SSI - Holding metformin for now as this would not help the picture with his  diarrhea and recent CT contrast. - On gabapentin at home. Continue here.    HTN - Somewhat hypertensive here 174/77 on admission. 126/101 on the floor.  - Continue home lisinopril 20mg  daily for now.  - Additional therapy as indicated.    FEN/GI: Clear liquids for now. MIVF given a 1 L bolus, then MIVF Prophylaxis: SCD's given GI bleeding.   Disposition: Admit for workup of colitis and pending improvement.   History of Present Illness: Eric Morton is a 49 y.o. male presenting with diarrhea for two months. He has been having loose, watery stool frequently throughout the day for the past two months with transition to bloody stools BRB over the past two weeks. He reports minimal to moderate abdominal pain. He has had nausea and two episodes of vomiting which he reports were bloody prior to coming in to the hospital. No fevers at home. He has been stooling on himself at night due to frequency of stools and sudden onset. No previous abx use. Not been in the hospital recently. Never had this before. He has been taking pepcid for epigastric pain. He has also been taking loperamide for symptom control since January which he says will cause his stools to sometimes firm up, but when he stops they return to being watery. He takes metformin and lisinopril at home.  He has never had a colonoscopy. No history of colon cancer. No known IBD. No recent travel. No sick contacts. No recent consumption of raw meat or other questionable foods. Has been eating and drinking well at home. He says he smokes 5-6 cigarettes daily. Does not want a nicotine patch here. He does drink ETOH, but says he has been cutting back. He had 1 "12oz beer" three nights ago. He denies Substance abuse.   Review Of Systems: Per HPI with the following additions: None Otherwise 12 point review of systems was performed and was unremarkable.  Patient Active Problem List   Diagnosis Date Noted  . Colitis 04/25/2014  . Cellulitis of breast of  male 09/25/2011  . HTN (hypertension) 09/25/2011  . DM (diabetes mellitus) 09/25/2011  . Leukopenia 09/25/2011  . ETOH abuse 09/25/2011  . Cocaine abuse 09/25/2011  . Tobacco abuse 09/25/2011  . Neuropathy 09/25/2011   Past Medical History: Past Medical History  Diagnosis Date  . Hypertension   . Substance abuse   . Peripheral neuropathy   . Hypercholesterolemia   . Type II diabetes mellitus   . Alcohol liver damage     "just a little damage"  . Chronic kidney disease     "jsut a little damage"  . Anxiety   . History of stomach ulcers    Past Surgical History: Past Surgical History  Procedure Laterality Date  . No past surgeries    . Irrigation and debridement abscess  09/26/2011    Procedure: IRRIGATION AND DEBRIDEMENT ABSCESS;  Surgeon: Imogene Burn. Georgette Dover, MD;  Location: [facility];  Service: General;  Laterality: Left;  Incision and Drainage left breast abscess   Social History: History  Substance Use Topics  .  Smoking status: Current Every Day Smoker -- 0.50 packs/day for 32 years    Types: Cigarettes  . Smokeless tobacco: Never Used  . Alcohol Use: 42.0 oz/week    70 Cans of beer per week     Comment: 3/14 quit drinking 5 days ago and then drank last nite   Additional social history: None  Please also refer to relevant sections of EMR.  Family History: Family History  Problem Relation Age of Onset  . Diabetes Mother   . Hypertension Mother    Allergies and Medications: No Known Allergies No current facility-administered medications on file prior to encounter.   Current Outpatient Prescriptions on File Prior to Encounter  Medication Sig Dispense Refill  . gabapentin (NEURONTIN) 400 MG capsule Take 2 capsules (800 mg total) by mouth every 12 (twelve) hours. 60 capsule 0  . lansoprazole (PREVACID) 30 MG capsule Take 1 capsule (30 mg total) by mouth daily at 12 noon. 30 capsule 0  . lisinopril (PRINIVIL,ZESTRIL) 10 MG tablet Take 2 tablets (20 mg total) by mouth  daily. 30 tablet 0  . metFORMIN (GLUCOPHAGE) 1000 MG tablet Take 1 tablet (1,000 mg total) by mouth daily with breakfast. 30 tablet 0    Objective: BP 154/83 mmHg  Pulse 75  Temp(Src) 98.5 F (36.9 C) (Oral)  Resp 18  Ht 5\' 7"  (1.702 m)  Wt 160 lb (72.576 kg)  BMI 25.05 kg/m2  SpO2 98% Exam: General: NAD, AAOx3, agitated. AAM.  HEENT: NCAT, Poor dentition with dental caries of posterior left molar, PERRLA, EOMI, No LAD, Supple, FROM, MMM. Icterus present.  Cardiovascular: RRR, No MGR, Normal S1/S2, 2+ distal pulses.  Respiratory: CTAB, appropriate rate, unlabored.  Abdomen: S, TTP LUQ, suprapubic, however tender throughout, no peritoneal signs, liver span 2 cm below costal margin, No masses, + hyperactive bowel sounds.  Extremities: WWP, 2+ distal pulses, MAEW.  Skin: No rashes, no lesions.  Neuro: No focal deficits.   Labs and Imaging: CBC BMET   Recent Labs Lab 04/25/14 1347  WBC 8.3  HGB 16.3  HCT 47.5  PLT 235    Recent Labs Lab 04/25/14 1347  NA 134*  K 3.7  CL 101  CO2 26  BUN 6  CREATININE 0.93  GLUCOSE 99  CALCIUM 8.4     Drugs of Abuse     Component Value Date/Time   LABOPIA NONE DETECTED 04/25/2014 1751   LABOPIA NEG 11/10/2007 2043   COCAINSCRNUR POSITIVE* 04/25/2014 1751   COCAINSCRNUR POS* 11/10/2007 2043   LABBENZ NONE DETECTED 04/25/2014 1751   LABBENZ NEG 11/10/2007 2043   AMPHETMU NONE DETECTED 04/25/2014 1751   AMPHETMU NEG 11/10/2007 2043   THCU POSITIVE* 04/25/2014 1751   LABBARB NONE DETECTED 04/25/2014 1751    Urinalysis    Component Value Date/Time   COLORURINE AMBER* 04/25/2014 1751   APPEARANCEUR CLOUDY* 04/25/2014 1751   LABSPEC 1.025 04/25/2014 1751   PHURINE 5.5 04/25/2014 1751   GLUCOSEU NEGATIVE 04/25/2014 1751   HGBUR NEGATIVE 04/25/2014 1751   BILIRUBINUR SMALL* 04/25/2014 1751   KETONESUR 15* 04/25/2014 1751   PROTEINUR 30* 04/25/2014 1751   UROBILINOGEN 1.0 04/25/2014 1751   NITRITE NEGATIVE 04/25/2014 1751    LEUKOCYTESUR NEGATIVE 04/25/2014 1751   Blood Alc - < 5  FOBT - positive.   Hgb A1C - 6.5   Lipase - 18  CT Abd 3/29:  IMPRESSION: Diffuse wall thickening throughout the sigmoid colon most compatible with infectious or inflammatory colitis.  Scattered distal descending colonic and sigmoid  diverticulosis.  Normal appendix  Aquilla Hacker, MD 04/25/2014, 9:29 PM PGY-1, [place] Intern pager: [phone], text pages welcome   I have examined the patient today with the intern. We have discussed the  assessment and plan. The interns note above, has been altered to reflect the exam, assessment and plan. Anikka Marsan, DO    [place], Ethelyn Cerniglia DO PGY3 [facility]

## 2014-04-25 NOTE — Progress Notes (Signed)
MD oncall stated that pt is med surg and doesn't need telemetry. Ranelle Oyster, RN

## 2014-04-25 NOTE — ED Provider Notes (Signed)
CSN: [id number]     Arrival date & time 04/25/14  1321 History   First MD Initiated Contact with Patient 04/25/14 1629     Chief Complaint  Patient presents with  . Diarrhea     (Consider location/radiation/quality/duration/timing/severity/associated sxs/prior Treatment) Patient is a 49 y.o. male presenting with diarrhea. The history is provided by the patient. No language interpreter was used.  Diarrhea Associated symptoms: abdominal pain and vomiting   Associated symptoms: no chills, no fever and no headaches   Eric Morton is a 49 year old male with past medical history of diabetes, hypertension, peptic ulcer disease, substance abuse, alcohol liver damage, peripheral neuropathy secondary to diabetes presenting to the ED with diarrhea and abdominal pain. Patient reported the diarrhea has been ongoing since January, but has progressively gotten worse this month. Patient reports that he has episodes of diarrhea followed by episodes of normal stools. Reported that we has episodes of diarrhea it is watery stools. Reported that he noticed bright red blood in his stool today with each bowel movement and some mucus. Patient reports that he's been using Imodium intermittently for the past month reports that Imodium does help, reports that his stools go back to normal for approximately 2 days but then eventually resorts back to diarrhea. Stated that he's been experiencing epigastric pain for the past 2 years describes a nagging, gnawing pain that is worse with eating and drinking. Reported that over the past month he has been experiencing sharp shooting pain to lower aspect of his abdomen without radiation worse with eating and drinking alcohol - reported that the discomfort has occurred continuously for the past couple of days. Stated that he was seen and assessed in ED setting a couple of weeks ago and was started on Prevacid, reports that he's been taking this medication as prescribed, but stated that it  is not helping his pain. Stated that he had an episode of vomiting this past week-approximately 2 and no blood or bile noted. Patient reports he's been taking his blood pressure medication and glucose medication as prescribed. Reported that he drank a 12 ounce of Michelob on Friday. Reported that he used cocaine the night before last. Reports he does continue to smoke cigarettes approximate 4-5 cigarettes per day. Denied heroin or marijuana. Denied fever, chills, back pain, neck pain, neck stiffness, blurred vision, sudden loss of vision, chest pain, shortness of breath, difficulty breathing, hematemesis, numbness, tingling, weakness, travels, changes to medication. PCP none  Past Medical History  Diagnosis Date  . Hypertension   . Substance abuse   . Peripheral neuropathy   . Hypercholesterolemia   . Type II diabetes mellitus   . Alcohol liver damage     "just a little damage"  . Chronic kidney disease     "jsut a little damage"  . Anxiety   . History of stomach ulcers    Past Surgical History  Procedure Laterality Date  . No past surgeries    . Irrigation and debridement abscess  09/26/2011    Procedure: IRRIGATION AND DEBRIDEMENT ABSCESS;  Surgeon: Imogene Burn. Georgette Dover, MD;  Location: [place];  Service: General;  Laterality: Left;  Incision and Drainage left breast abscess   Family History  Problem Relation Age of Onset  . Diabetes Mother   . Hypertension Mother    History  Substance Use Topics  . Smoking status: Current Every Day Smoker -- 0.50 packs/day for 32 years    Types: Cigarettes  . Smokeless tobacco: Never Used  .  Alcohol Use: 42.0 oz/week    70 Cans of beer per week     Comment: 3/14 quit drinking 5 days ago and then drank last nite    Review of Systems  Constitutional: Negative for fever and chills.  Respiratory: Negative for chest tightness and shortness of breath.   Cardiovascular: Negative for chest pain.  Gastrointestinal: Positive for nausea, vomiting, abdominal  pain, diarrhea and blood in stool. Negative for constipation and anal bleeding.  Musculoskeletal: Negative for back pain, neck pain and neck stiffness.  Neurological: Negative for dizziness, weakness and headaches.      Allergies  Review of patient's allergies indicates no known allergies.  Home Medications   Prior to Admission medications   Medication Sig Start Date End Date Taking? Authorizing Provider  famotidine (PEPCID AC) 10 MG chewable tablet Chew 10 mg by mouth 2 (two) times daily as needed for heartburn.   Yes Historical Provider, MD  gabapentin (NEURONTIN) 400 MG capsule Take 2 capsules (800 mg total) by mouth every 12 (twelve) hours. 03/30/14  Yes Tiffany Carlota Raspberry, PA-C  lansoprazole (PREVACID) 30 MG capsule Take 1 capsule (30 mg total) by mouth daily at 12 noon. 04/10/14  Yes Malvin Johns, MD  lisinopril (PRINIVIL,ZESTRIL) 10 MG tablet Take 2 tablets (20 mg total) by mouth daily. 03/30/14  Yes Delos Haring, PA-C  loperamide (IMODIUM) 1 MG/5ML solution Take 2 mg by mouth as needed for diarrhea or loose stools.   Yes Historical Provider, MD  metFORMIN (GLUCOPHAGE) 1000 MG tablet Take 1 tablet (1,000 mg total) by mouth daily with breakfast. 03/30/14  Yes Tiffany Carlota Raspberry, PA-C   BP 122/63 mmHg  Pulse 82  Temp(Src) 98.5 F (36.9 C) (Oral)  Resp 18  Ht 5\' 7"  (1.702 m)  Wt 160 lb (72.576 kg)  BMI 25.05 kg/m2  SpO2 100% Physical Exam  Constitutional: He is oriented to person, place, and time. He appears well-developed and well-nourished. No distress.  HENT:  Head: Normocephalic and atraumatic.  Mouth/Throat: Oropharynx is clear and moist. No oropharyngeal exudate.  Eyes: Conjunctivae are normal. Pupils are equal, round, and reactive to light. Right eye exhibits no discharge. Left eye exhibits no discharge.  Neck: Normal range of motion. Neck supple. No tracheal deviation present.  Cardiovascular: Normal rate, regular rhythm and normal heart sounds.   Pulses:      Radial pulses are  2+ on the right side, and 2+ on the left side.       Dorsalis pedis pulses are 2+ on the right side, and 2+ on the left side.  Pulmonary/Chest: Effort normal and breath sounds normal. No respiratory distress. He has no wheezes. He has no rales.  Abdominal: Soft. Bowel sounds are normal. He exhibits no distension. There is tenderness in the right lower quadrant, epigastric area and left lower quadrant. There is no rebound and no guarding.  Genitourinary:  Rectal exam: Negative swelling, erythema, inflammation, lesions, sores, hemorrhoids identified. Negative bright red blood per rectum. Brown stool noted on glove. Negative gross blood. Exam chaperoned with nurse, Larwance Sachs  Musculoskeletal: Normal range of motion.  Lymphadenopathy:    He has no cervical adenopathy.  Neurological: He is alert and oriented to person, place, and time. No cranial nerve deficit. He exhibits normal muscle tone. Coordination normal.  Skin: Skin is warm and dry. No rash noted. He is not diaphoretic. No erythema.  Psychiatric: He has a normal mood and affect. His behavior is normal. Thought content normal.  Nursing note and vitals reviewed.  ED Course  Procedures (including critical care time)  Results for orders placed or performed during the hospital encounter of 04/25/14  CBC with Differential  Result Value Ref Range   WBC 8.3 4.0 - 10.5 K/uL   RBC 5.46 4.22 - 5.81 MIL/uL   Hemoglobin 16.3 13.0 - 17.0 g/dL   HCT 47.5 39.0 - 52.0 %   MCV 87.0 78.0 - 100.0 fL   MCH 29.9 26.0 - 34.0 pg   MCHC 34.3 30.0 - 36.0 g/dL   RDW 12.5 11.5 - 15.5 %   Platelets 235 150 - 400 K/uL   Neutrophils Relative % 58 43 - 77 %   Neutro Abs 4.9 1.7 - 7.7 K/uL   Lymphocytes Relative 31 12 - 46 %   Lymphs Abs 2.6 0.7 - 4.0 K/uL   Monocytes Relative 7 3 - 12 %   Monocytes Absolute 0.6 0.1 - 1.0 K/uL   Eosinophils Relative 3 0 - 5 %   Eosinophils Absolute 0.2 0.0 - 0.7 K/uL   Basophils Relative 1 0 - 1 %   Basophils Absolute 0.0  0.0 - 0.1 K/uL  Comprehensive metabolic panel  Result Value Ref Range   Sodium 134 (L) 135 - 145 mmol/L   Potassium 3.7 3.5 - 5.1 mmol/L   Chloride 101 96 - 112 mmol/L   CO2 26 19 - 32 mmol/L   Glucose, Bld 99 70 - 99 mg/dL   BUN 6 6 - 23 mg/dL   Creatinine, Ser 0.93 0.50 - 1.35 mg/dL   Calcium 8.4 8.4 - 10.5 mg/dL   Total Protein 6.2 6.0 - 8.3 g/dL   Albumin 2.4 (L) 3.5 - 5.2 g/dL   AST 33 0 - 37 U/L   ALT 40 0 - 53 U/L   Alkaline Phosphatase 145 (H) 39 - 117 U/L   Total Bilirubin 0.7 0.3 - 1.2 mg/dL   GFR calc non Af Amer >90 >90 mL/min   GFR calc Af Amer >90 >90 mL/min   Anion gap 7 5 - 15  Lipase, blood  Result Value Ref Range   Lipase 18 11 - 59 U/L  Urinalysis, Routine w reflex microscopic  Result Value Ref Range   Color, Urine AMBER (A) YELLOW   APPearance CLOUDY (A) CLEAR   Specific Gravity, Urine 1.025 1.005 - 1.030   pH 5.5 5.0 - 8.0   Glucose, UA NEGATIVE NEGATIVE mg/dL   Hgb urine dipstick NEGATIVE NEGATIVE   Bilirubin Urine SMALL (A) NEGATIVE   Ketones, ur 15 (A) NEGATIVE mg/dL   Protein, ur 30 (A) NEGATIVE mg/dL   Urobilinogen, UA 1.0 0.0 - 1.0 mg/dL   Nitrite NEGATIVE NEGATIVE   Leukocytes, UA NEGATIVE NEGATIVE  Ethanol  Result Value Ref Range   Alcohol, Ethyl (B) <5 0 - 9 mg/dL  Urine rapid drug screen (hosp performed)  Result Value Ref Range   Opiates NONE DETECTED NONE DETECTED   Cocaine POSITIVE (A) NONE DETECTED   Benzodiazepines NONE DETECTED NONE DETECTED   Amphetamines NONE DETECTED NONE DETECTED   Tetrahydrocannabinol POSITIVE (A) NONE DETECTED   Barbiturates NONE DETECTED NONE DETECTED  Urine microscopic-add on  Result Value Ref Range   Squamous Epithelial / LPF RARE RARE   WBC, UA 0-2 <3 WBC/hpf   RBC / HPF 0-2 <3 RBC/hpf   Bacteria, UA RARE RARE   Casts HYALINE CASTS (A) NEGATIVE  POC occult blood, ED  Result Value Ref Range   Fecal Occult Bld POSITIVE (A) NEGATIVE   Labs  Review Labs Reviewed  COMPREHENSIVE METABOLIC PANEL -  Abnormal; Notable for the following:    Sodium 134 (*)    Albumin 2.4 (*)    Alkaline Phosphatase 145 (*)    All other components within normal limits  URINALYSIS, ROUTINE W REFLEX MICROSCOPIC - Abnormal; Notable for the following:    Color, Urine AMBER (*)    APPearance CLOUDY (*)    Bilirubin Urine SMALL (*)    Ketones, ur 15 (*)    Protein, ur 30 (*)    All other components within normal limits  URINE RAPID DRUG SCREEN (HOSP PERFORMED) - Abnormal; Notable for the following:    Cocaine POSITIVE (*)    Tetrahydrocannabinol POSITIVE (*)    All other components within normal limits  URINE MICROSCOPIC-ADD ON - Abnormal; Notable for the following:    Casts HYALINE CASTS (*)    All other components within normal limits  POC OCCULT BLOOD, ED - Abnormal; Notable for the following:    Fecal Occult Bld POSITIVE (*)    All other components within normal limits  STOOL CULTURE  CBC WITH DIFFERENTIAL/PLATELET  LIPASE, BLOOD  ETHANOL    Imaging Review Ct Abdomen Pelvis W Contrast  04/25/2014   CLINICAL DATA:  Bloody diarrhea for 2 months. Lower abdominal pain for 2-3 weeks.  EXAM: CT ABDOMEN AND PELVIS WITH CONTRAST  TECHNIQUE: Multidetector CT imaging of the abdomen and pelvis was performed using the standard protocol following bolus administration of intravenous contrast.  CONTRAST:  126mL OMNIPAQUE IOHEXOL 300 MG/ML  SOLN  COMPARISON:  07/16/2006  FINDINGS: Lower chest: Lung bases are clear. No effusions. Heart is normal size.  Hepatobiliary: No biliary ductal dilatation or focal hepatic lesion. Gallbladder grossly unremarkable.  Pancreas: No focal abnormality or ductal dilatation.  Spleen: No focal abnormality.  Normal size.  Adrenals/Urinary Tract: No focal renal or adrenal abnormality. No hydronephrosis. Urinary bladder is unremarkable.  Stomach/Bowel: Stomach and small bowel are decompressed and unremarkable. There is diffuse sigmoid wall thickening. There are scattered diverticula. However,  due to the diffuse nature of the wall thickening, I favor this represents colitis rather than diverticulitis. Mild stranding around the sigmoid colon. Appendix is visualized and unremarkable.  Vascular/Lymphatic: No retroperitoneal or mesenteric adenopathy. Aorta normal caliber.  Reproductive: No mass or other significant abnormality. Densely calcified seminal vesicles.  Other: No free fluid or free air.  Musculoskeletal: No focal bone lesion or acute bony abnormality.  IMPRESSION: Diffuse wall thickening throughout the sigmoid colon most compatible with infectious or inflammatory colitis.  Scattered distal descending colonic and sigmoid diverticulosis.  Normal appendix.   Electronically Signed   By: Rolm Baptise M.D.   On: 04/25/2014 19:42     EKG Interpretation None      8:49 PM This provider spoke with Dr. Jayme Cloud, resident from internal medicine teaching services. Discussed case, labs, imaging, ED course in great detail. Patient to be admitted to hospital, telemetry floor. Did not recommend administration of antibiotics until attending physician sees the patient.  MDM   Final diagnoses:  Colitis  Diarrhea  Dehydration    Medications  sodium chloride 0.9 % bolus 1,000 mL (not administered)  sodium chloride 0.9 % bolus 1,000 mL (1,000 mLs Intravenous New Bag/Given 04/25/14 1645)  pantoprazole (PROTONIX) injection 40 mg (40 mg Intravenous Given 04/25/14 1711)  iohexol (OMNIPAQUE) 300 MG/ML solution 25 mL (25 mLs Oral Contrast Given 04/25/14 1732)  iohexol (OMNIPAQUE) 300 MG/ML solution 100 mL (100 mLs Intravenous Contrast Given 04/25/14 1918)  Filed Vitals:   04/25/14 1631 04/25/14 1715 04/25/14 1745 04/25/14 1830  BP:  156/87 155/76 122/63  Pulse:  84 73 82  Temp:      TempSrc:      Resp:      Height: 5\' 7"  (1.702 m)     Weight: 160 lb (72.576 kg)     SpO2:  100% 100% 100%   CBC negative elevated leukocytosis. Hemoglobin 16.3, hematocrit 47.5. CMP noted mildly low sodium of 134.  Mildly elevated alkaline phosphatase of 145. Ethanol negative elevation. Lipase negative elevation. Fecal occult positive. Urinalysis noted-negative hemoglobin, nitrites, leukocytes-negative findings of infection. Urine drug screen positive for cocaine and cannabis. CT abdomen and pelvis with contrast noted diffuse wall thickening throughout the sigmoid colon most compatible with infectious or inflammatory colitis - scattered distal descending colonic and sigmoid diverticulosis. Normal appendix. Patient presenting to the ED with diarrhea that has been ongoing for the past 2 weeks, progressively worse. Patient has had at least 5-7 episodes of diarrhea in ED setting-appeared to be loose with bright red blood specks. CT abdomen and pelvis with contrast identified colitis-infectious versus inflammatory. Patient given IV fluids and IV Protonix in ED setting. Patient has a history of alcohol abuse. Patient to be admitted regarding colitis and continuous diarrhea does not resolve with outpatient treatment. Discussed plan for admission with patient who agrees to plan of care. Patient to be admitted under the care of internal medicine teaching services. Patient stable for transfer to floor.  Jamse Mead, PA-C 04/25/14 2053  Orlie Dakin, MD 04/26/14 [phone]

## 2014-04-25 NOTE — Progress Notes (Signed)
Received report from ED. Ayline Dingus Thacker, RN 

## 2014-04-25 NOTE — ED Notes (Signed)
Report from hope, rn.  Pt care assumed

## 2014-04-25 NOTE — Progress Notes (Signed)
Notified MD oncall that pt's BP is 126/101. No new orders given. Will continue to monitor pt. Ranelle Oyster, RN

## 2014-04-25 NOTE — ED Notes (Signed)
Pt sts diarrhea x several weeks that is intermittent; pt sts at times difficulty controlling; pt poor historian; pt denies pain

## 2014-04-26 ENCOUNTER — Encounter (HOSPITAL_COMMUNITY): Payer: Self-pay | Admitting: Physician Assistant

## 2014-04-26 LAB — COMPREHENSIVE METABOLIC PANEL
ALBUMIN: 2.3 g/dL — AB (ref 3.5–5.2)
ALK PHOS: 138 U/L — AB (ref 39–117)
ALT: 42 U/L (ref 0–53)
ANION GAP: 5 (ref 5–15)
AST: 35 U/L (ref 0–37)
BUN: 5 mg/dL — ABNORMAL LOW (ref 6–23)
CALCIUM: 8.4 mg/dL (ref 8.4–10.5)
CO2: 26 mmol/L (ref 19–32)
Chloride: 109 mmol/L (ref 96–112)
Creatinine, Ser: 0.92 mg/dL (ref 0.50–1.35)
GFR calc Af Amer: >90 mL/min (ref >90)
GFR calc non Af Amer: >90 mL/min (ref >90)
Glucose, Bld: 88 mg/dL (ref 70–99)
POTASSIUM: 4 mmol/L (ref 3.5–5.1)
Sodium: 140 mmol/L (ref 135–145)
TOTAL PROTEIN: 5.5 g/dL — AB (ref 6.0–8.3)
Total Bilirubin: 1 mg/dL (ref 0.3–1.2)

## 2014-04-26 LAB — GLUCOSE, CAPILLARY
GLUCOSE-CAPILLARY: 102 mg/dL — AB (ref 70–99)
Glucose-Capillary: 290 mg/dL — ABNORMAL HIGH (ref 70–99)
Glucose-Capillary: 77 mg/dL (ref 70–99)
Glucose-Capillary: 82 mg/dL (ref 70–99)
Glucose-Capillary: 94 mg/dL (ref 70–99)

## 2014-04-26 LAB — LACTIC ACID, PLASMA: LACTIC ACID, VENOUS: 0.7 mmol/L (ref 0.5–2.0)

## 2014-04-26 LAB — CBC
HEMATOCRIT: 48.9 % (ref 39.0–52.0)
Hemoglobin: 16.2 g/dL (ref 13.0–17.0)
MCH: 29.5 pg (ref 26.0–34.0)
MCHC: 33.1 g/dL (ref 30.0–36.0)
MCV: 88.9 fL (ref 78.0–100.0)
Platelets: 225 10*3/uL (ref 150–400)
RBC: 5.5 MIL/uL (ref 4.22–5.81)
RDW: 12.8 % (ref 11.5–15.5)
WBC: 6.5 10*3/uL (ref 4.0–10.5)

## 2014-04-26 LAB — CLOSTRIDIUM DIFFICILE BY PCR: Toxigenic C. Difficile by PCR: NEGATIVE

## 2014-04-26 LAB — LACTATE DEHYDROGENASE: LDH: 210 U/L (ref 94–250)

## 2014-04-26 LAB — TSH: TSH: 1.084 u[IU]/mL (ref 0.350–4.500)

## 2014-04-26 LAB — HEPATITIS C ANTIBODY: HCV Ab: NEGATIVE

## 2014-04-26 LAB — PHOSPHORUS: PHOSPHORUS: 2.7 mg/dL (ref 2.3–4.6)

## 2014-04-26 LAB — MAGNESIUM: Magnesium: 1.6 mg/dL (ref 1.5–2.5)

## 2014-04-26 MED ORDER — ONDANSETRON HCL 4 MG/2ML IJ SOLN
4.0000 mg | Freq: Four times a day (QID) | INTRAMUSCULAR | Status: DC
  Administered 2014-04-26: 4 mg via INTRAVENOUS
  Filled 2014-04-26 (×2): qty 2

## 2014-04-26 MED ORDER — PANTOPRAZOLE SODIUM 40 MG PO TBEC
40.0000 mg | DELAYED_RELEASE_TABLET | Freq: Every day | ORAL | Status: DC
Start: 2014-04-26 — End: 2014-04-28
  Administered 2014-04-27 – 2014-04-28 (×2): 40 mg via ORAL
  Filled 2014-04-26 (×2): qty 1

## 2014-04-26 MED ORDER — METRONIDAZOLE IN NACL 5-0.79 MG/ML-% IV SOLN
500.0000 mg | Freq: Three times a day (TID) | INTRAVENOUS | Status: DC
  Administered 2014-04-26 – 2014-04-27 (×3): 500 mg via INTRAVENOUS
  Filled 2014-04-26 (×5): qty 100

## 2014-04-26 MED ORDER — CIPROFLOXACIN IN D5W 400 MG/200ML IV SOLN
400.0000 mg | Freq: Two times a day (BID) | INTRAVENOUS | Status: DC
  Administered 2014-04-26 – 2014-04-27 (×2): 400 mg via INTRAVENOUS
  Filled 2014-04-26 (×3): qty 200

## 2014-04-26 NOTE — Progress Notes (Addendum)
Notified [place], MD that pt refused his labs. MD stated to get labs in morning. Notified Holly in lab to reschedule labs in the morning. Earnest Bailey stated okay that she will reschedule labs to be drawn in the morning. Will continue to monitor pt. Ranelle Oyster, RN

## 2014-04-26 NOTE — Progress Notes (Signed)
INITIAL NUTRITION ASSESSMENT  DOCUMENTATION CODES Per approved criteria  -Not Applicable   INTERVENTION: Resource Breeze po TID, each supplement provides 250 kcal and 9 grams of protein  NUTRITION DIAGNOSIS: Predicted suboptimal oral intake related to altered GI function as evidenced by nausea, vomiting, diarrhea PTA.   Goal: Pt will meet >90% of estimated nutritional needs  Monitor:  PO/supplement intake, labs, weight changes, I/O's  Reason for Assessment: MST=4  49 y.o. male  Admitting Dx: <principal problem not specified>  Eric Morton is a 49 y.o. male. 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 Hx polycythemia vera requiring phlebotomy (Dr Lamonte Sakai) in 2006-2011 (unable to access records).   ASSESSMENT: Pt admitted with complaints of bloody diarrhea and vomiting. Per GI notes, CT scan shows sigmoid colitis and scattered left sided tics. Pt with hx of polysubstance abuse. His last drink was 3 days ago. UDS positive for THC and cocaine. C-diff negative.  Pt was somnolent at time of visit. Pt has been consuming meals well since hospitalization; noted 100% of lunch tray completed at bedside.  Per MD notes, pt reports a 10-20# wt loss over the past 2 months. Wt hx difficult to confirm due to lack of data. Noted wt has been stable over the past month.  Pt has Resource Breeze ordered TID. Agree with order due to hx of polysubstance abuse and good glycemic control.  No signs of fat or muscle depletion noted on temple, orbital, arms, or clavicle areas.  Labs reviewed. BUN <5.   Height: Ht Readings from Last 1 Encounters:  04/25/14 5\' 7"  (1.702 m)    Weight: Wt Readings from Last 1 Encounters:  04/25/14 157 lb (71.215 kg)    Ideal Body  Weight: 148#  % Ideal Body Weight: 107%  Wt Readings from Last 10 Encounters:  04/25/14 157 lb (71.215 kg)  04/13/14 160 lb (72.576 kg)  03/30/14 160 lb 9 oz (72.831 kg)  10/07/11 182 lb 3.2 oz (82.645 kg)  09/25/11 186 lb 11.2 oz (84.687 kg)    Usual Body Weight: 182#  % Usual Body Weight: 86%  BMI:  Body mass index is 24.58 kg/(m^2). Normal weight range  Estimated Nutritional Needs: Kcal: 1750-1950 Protein: 80-90 grams Fluid: 1.8-2.0 L  Skin: Intact  Diet Order: Diet Carb Modified Fluid consistency:: Thin; Room service appropriate?: Yes  EDUCATION NEEDS: -Education not appropriate at this time   Intake/Output Summary (Last 24 hours) at 04/26/14 1346 Last data filed at 04/26/14 1000  Gross per 24 hour  Intake 3902.34 ml  Output      0 ml  Net 3902.34 ml    Last BM: 04/25/14  Labs:   Recent Labs Lab 04/25/14 1347 04/25/14 2253 04/26/14 0510  NA 134*  --  140  K 3.7  --  4.0  CL 101  --  109  CO2 26  --  26  BUN 6  --  5*  CREATININE 0.93  --  0.92  CALCIUM 8.4  --  8.4  MG  --  1.6  --   PHOS  --  2.7  --   GLUCOSE 99  --  88    CBG (last 3)   Recent Labs  04/25/14 2354 04/26/14 0809 04/26/14 1224  GLUCAP 94 82 77  Lab Results  Component Value Date   HGBA1C 6.50 04/13/2014   Scheduled Meds: . antiseptic oral rinse  7 mL Mouth Rinse q12n4p  . chlorhexidine  15 mL Mouth Rinse BID  . ciprofloxacin  400 mg Intravenous Q12H  . feeding supplement (RESOURCE BREEZE)  1 Container Oral TID BM  . folic acid  1 mg Oral Daily  . gabapentin  800 mg Oral Q12H  . insulin aspart  0-15 Units Subcutaneous TID WC  . lisinopril  20 mg Oral Daily  . metronidazole  500 mg Intravenous Q8H  . multivitamin with minerals  1 tablet Oral Daily  . ondansetron (ZOFRAN) IV  4 mg Intravenous 4 times per day  . pantoprazole  40 mg Oral Q0600  . thiamine  100 mg Oral Daily   Or  . thiamine  100 mg Intravenous Daily    Continuous Infusions:   Past Medical  History  Diagnosis Date  . Hypertension   . Substance abuse   . Peripheral neuropathy   . Hypercholesterolemia   . Type II diabetes mellitus   . Alcohol liver damage     "just a little damage"  . Chronic kidney disease     "jsut a little damage"  . Anxiety   . History of stomach ulcers     not confirmed, never underwent diagnostic endo or radiology, this manifested as epigastric distress.   . Polycythemia vera 2006    Past Surgical History  Procedure Laterality Date  . Irrigation and debridement abscess  09/26/2011    Procedure: IRRIGATION AND DEBRIDEMENT ABSCESS;  Surgeon: Imogene Burn. Georgette Dover, MD;  Location: [facility];  Service: General;  Laterality: Left;  Incision and Drainage left breast abscess    Genell Thede A. Jimmye Norman, RD, LDN, CDE Pager: [phone] After hours Pager: [phone]

## 2014-04-26 NOTE — Consult Note (Signed)
[facility] Consult: 11:30 AM 04/26/2014  LOS: 1 day    Referring Provider: Dr Andria Frames  Primary Care Physician:  [facility] Primary Gastroenterologist:  unassigned    Reason for Consultation:  Colitis. Bloody diarrhea.  Abdominal pain   HPI: Eric Morton is a 49 y.o. male.  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  Hx polycythemia vera requiring phlebotomy (Dr Lamonte Sakai) in 2006-2011 (unable to access records).      Non-compliant with Metformin for 3 months til restarted ~3/18.  Reports 8 weeks of loose stools.  Initially 1 to 2 per day but progressed to 5 to 10 x per day.  Nocturnal incontinence.  Initially mays have responded to imodium, but this is no longer effective.  Stopped drinking beer (160 oz daily) about 5 weeks ago, did not change the diarrheal issues. startting the metformin did not add to his issues with loose stools.   Saw blood in stool ( minor to small volumes mixed with the liquid stool) and started having lower abdominal pain (>on left, not post prandial) starting on 3/16. Chronic + mild epigastric discomfort (not new): Pepcid started ~ 3/18.  30 # weight loss since 01/2014 (185-190 baseline, now 157-160#)  Presented to ED due to ongoing diarrhea, stool incontinence.  + cramping abdominal pain. Also c/o penile burning on urination. No fever, no sweats.   CT scan shows sigmoid colitis and scattered left sided tics.  WBCs normal, no anemia.  Alk phos elevated, albumin low but o/w normal LFTs and Lipase.  Normal coags.  UDS + for THC and cocaine. etoh screen is normal. (  admits to periodic cocaine and mja).      Past Medical History  Diagnosis Date  . Hypertension   . Substance abuse   . Peripheral neuropathy   . Hypercholesterolemia   . Type II diabetes mellitus   . Alcohol liver damage     "just a little damage"  . Chronic kidney disease     "jsut a little damage"  . Anxiety   . History of stomach ulcers     not confirmed, never underwent diagnostic endo or radiology, this manifested as epigastric distress.   . Polycythemia vera 2006    Past Surgical History  Procedure Laterality Date  . Irrigation and debridement abscess  09/26/2011    Procedure: IRRIGATION AND DEBRIDEMENT ABSCESS;  Surgeon: Imogene Burn. Georgette Dover, MD;  Location: [place];  Service: General;  Laterality: Left;  Incision and Drainage left breast abscess    Prior to Admission medications   Medication Sig Start Date End Date Taking? Authorizing Provider  famotidine (PEPCID AC) 10 MG chewable tablet Chew 10 mg by mouth 2 (two) times daily as needed for heartburn.   Yes Historical Provider, MD  gabapentin (NEURONTIN) 400 MG capsule Take 2 capsules (800 mg total) by mouth every  12 (twelve) hours. 03/30/14  Yes Tiffany Carlota Raspberry, PA-C  lansoprazole (PREVACID) 30 MG capsule Take 1 capsule (30 mg total) by mouth daily at 12 noon. 04/10/14  Yes Malvin Johns, MD  lisinopril (PRINIVIL,ZESTRIL) 10 MG tablet Take 2 tablets (20 mg total) by mouth daily. 03/30/14  Yes Delos Haring, PA-C  loperamide (IMODIUM) 1 MG/5ML solution Take 2 mg by mouth as needed for diarrhea or loose stools.   Yes Historical Provider, MD  metFORMIN (GLUCOPHAGE) 1000 MG tablet Take 1 tablet (1,000 mg total) by mouth daily with breakfast. 03/30/14  Yes Delos Haring, PA-C    Scheduled Meds: . antiseptic oral rinse  7 mL Mouth Rinse q12n4p  . chlorhexidine  15 mL Mouth Rinse BID  . ciprofloxacin  400 mg Intravenous Q12H  . feeding supplement (RESOURCE BREEZE)  1 Container Oral TID BM  . folic acid  1 mg Oral Daily  . gabapentin   800 mg Oral Q12H  . insulin aspart  0-15 Units Subcutaneous TID WC  . lisinopril  20 mg Oral Daily  . metronidazole  500 mg Intravenous Q8H  . multivitamin with minerals  1 tablet Oral Daily  . ondansetron (ZOFRAN) IV  4 mg Intravenous 4 times per day  . pantoprazole (PROTONIX) IV  40 mg Intravenous Q12H  . thiamine  100 mg Oral Daily   Or  . thiamine  100 mg Intravenous Daily   Infusions:   PRN Meds: acetaminophen **OR** acetaminophen, LORazepam **OR** LORazepam, morphine injection   Allergies as of 04/25/2014  . (No Known Allergies)    Family History  Problem Relation Age of Onset  . Diabetes Mother   . Hypertension Mother     History   Social History  . Marital Status: Widowed    Spouse Name: N/A  . Number of Children: N/A  . Years of Education: N/A   Occupational History  . Not on file.   Social History Main Topics  . Smoking status: Current Every Day Smoker -- 0.50 packs/day for 32 years    Types: Cigarettes  . Smokeless tobacco: Never Used  . Alcohol Use: 42.0 oz/week    70 Cans of beer per week     Comment: 3/14 quit drinking 5 days ago and then drank last nite  . Drug Use: Yes    Special: Marijuana     Comment: snorts and smokes cocaine; 09/25/2011 last time  . Sexual Activity: Yes   Other Topics Concern  . Not on file   Social History Narrative    REVIEW OF SYSTEMS: Constitutional:  No profound weakness but feels tired ENT:  No nose bleeds Pulm:  No cough or DOE CV:  No palpitations, no LE edema.  GU:  No hematuria, no frequency GI:  Per HPI.  No dysphagia.  Heme:  No unusual bleeding or bruising   Transfusions:  None, previous phlebotomy several years ago.  Neuro:  No headaches, no peripheral tingling or numbness Derm:  No itching, no rash or sores. No hair loss.  Endocrine:  No sweats or chills.  No polyuria or dysuria. Immunization: no flu shots, never wants one.  Travel:  None beyond local counties in last few months.    PHYSICAL  EXAM: Vital signs in last 24 hours: Filed Vitals:   04/26/14 noon  BP: 155/79  Pulse: 84  Temp: 98.2  Resp: 16   Wt Readings from Last 3 Encounters:  04/25/14 157 lb (71.215 kg)  04/13/14 160 lb (72.576 kg)  03/30/14 160  lb 9 oz (72.831 kg)    General: anxious, alert.  Looks chronically but not acutely ill.   Head:  No swelling or signs of trauma  Eyes:  Exopthalmos.  EOMI.  No icterus or conj pallor Ears:  Not HOH  Nose:  No congestion or discharge.   Mouth:  Poor dentition, many absent teeth, a few caries riddled teeth remain.  No sores, no exudates Neck:  No JVD or masses, no TMG.   Lungs:  Clear bil. No dyspnea.  + cough  Heart: RRR Abdomen:  Tender in LLQ, no GR.   Rectal: deferred   Musc/Skeltl: no joint contracture or swelling Extremities:  No CCE  Neurologic:  Oriented x 3.  No asterixis or tremor.  Full limb strength.   Skin:  No telangectasia, no sores Tattoos:  On upper right arm, non-professional from age 71.  Nodes:  No cervical adenopathy.    Psych:  Agitated but able to cooperate for hx/exam.  Pressured speech.    LAB RESULTS:  Recent Labs  04/25/14 1347 04/26/14 0510  WBC 8.3 6.5  HGB 16.3 16.2  HCT 47.5 48.9  PLT 235 225   BMET Lab Results  Component Value Date   NA 140 04/26/2014   NA 134* 04/25/2014   NA 135 04/10/2014   K 4.0 04/26/2014   K 3.7 04/25/2014   K 4.4 04/10/2014   CL 109 04/26/2014   CL 101 04/25/2014   CL 100 04/10/2014   CO2 26 04/26/2014   CO2 26 04/25/2014   CO2 23 04/10/2014   GLUCOSE 88 04/26/2014   GLUCOSE 99 04/25/2014   GLUCOSE 138* 04/10/2014   BUN 5* 04/26/2014   BUN 6 04/25/2014   BUN <5* 04/10/2014   CREATININE 0.92 04/26/2014   CREATININE 0.93 04/25/2014   CREATININE 1.13 04/10/2014   CALCIUM 8.4 04/26/2014   CALCIUM 8.4 04/25/2014   CALCIUM 8.5 04/10/2014   LFT  Recent Labs  04/25/14 1347 04/26/14 0510  PROT 6.2 5.5*  ALBUMIN 2.4* 2.3*  AST 33 35  ALT 40 42  ALKPHOS 145* 138*  BILITOT  0.7 1.0   PT/INR Lab Results  Component Value Date   INR 1.05 04/10/2014   Lipase     Component Value Date/Time   LIPASE 18 04/25/2014 1347    Drugs of Abuse     Component Value Date/Time   LABOPIA NONE DETECTED 04/25/2014 1751   LABOPIA NEG 11/10/2007 2043   COCAINSCRNUR POSITIVE* 04/25/2014 1751   COCAINSCRNUR POS* 11/10/2007 2043   LABBENZ NONE DETECTED 04/25/2014 1751   LABBENZ NEG 11/10/2007 2043   AMPHETMU NONE DETECTED 04/25/2014 1751   AMPHETMU NEG 11/10/2007 2043   THCU POSITIVE* 04/25/2014 1751   LABBARB NONE DETECTED 04/25/2014 1751     RADIOLOGY STUDIES: Images viewed Gatha Mayer, MD, [facility]  Ct Abdomen Pelvis W Contrast 04/25/2014     FINDINGS: Lower chest: Lung bases are clear. No effusions. Heart is normal size.  Hepatobiliary: No biliary ductal dilatation or focal hepatic lesion. Gallbladder grossly unremarkable.  Pancreas: No focal abnormality or ductal dilatation.  Spleen: No focal abnormality.  Normal size.  Adrenals/Urinary Tract: No focal renal or adrenal abnormality. No hydronephrosis. Urinary bladder is unremarkable.  Stomach/Bowel: Stomach and small bowel are decompressed and unremarkable. There is diffuse sigmoid wall thickening. There are scattered diverticula. However, due to the diffuse nature of the wall thickening, I favor this represents colitis rather than diverticulitis. Mild stranding around the sigmoid colon. Appendix is visualized and  unremarkable.  Vascular/Lymphatic: No retroperitoneal or mesenteric adenopathy. Aorta normal caliber.  Reproductive: No mass or other significant abnormality. Densely calcified seminal vesicles.  Other: No free fluid or free air.  Musculoskeletal: No focal bone lesion or acute bony abnormality.  IMPRESSION: Diffuse wall thickening throughout the sigmoid colon most compatible with infectious or inflammatory colitis.  Scattered distal descending colonic and sigmoid diverticulosis.  Normal appendix.   Electronically  Signed   By: Rolm Baptise M.D.   On: 04/25/2014 19:42    ENDOSCOPIC STUDIES: none  IMPRESSION:   *  Bloody diarrhea, sigmoid colitis on CT.  C diff negative 3/14.  Lactic acid not elevated.  Day 1-2 empiric Flagyl/Cipro   Stool clx pending.   *  Type 2 DM.  A1C 6.5 one week ago.   *  Polysubstance/ETOH abuse.  Says he has had no etoh for ~ 6 weeks.   *  Hx PUD?  Chronic, mild epigastric distress, no response to H2 blocker.  No previous EGD/UGI series.   *  Elevated alk phos, transaminases/lipase normal.   *  Hypoalbuminemia. 3# weight loss in past 3 weeks.   *  Anxiety.    PLAN:     *  Hepatitis serologies ordered.  Would obtain HIV.   *  Stop the bid IV Protonix.  Add empiric po PPI.  Not clear he needs the abx but left these in place.   *  Carb mod diet.   *  Endoscopic/colonoscopic studies will be decision of Dr Carlean Purl.   *  Given his c/o dysuria, should probably obtain U/A: defer to TS.    Azucena Freed  04/26/2014, 11:30 AM Pager: [phone]  [facility] Attending  I have also seen and assessed the patient and agree with the advanced practitioner's assessment and plan. Chronic diarrhea - I think it may be IBD. If stool Cx negative (hope some results tomorrow) then colonoscopy (? Friday) The risks and benefits as well as alternatives of endoscopic procedure(s) have been discussed and reviewed. All questions answered. The patient agrees to proceed.   Gatha Mayer, MD, [facility] [phone] (pager) 04/26/2014 4:59 PM \

## 2014-04-26 NOTE — Progress Notes (Signed)
Family Medicine Teaching Service Daily Progress Note Intern Pager: [phone]  Patient name: Eric Morton Medical record number: [id number] Date of birth: [birth year] Age: 49 y.o. Gender: male  Primary Care Provider: Default, Provider, MD Consultants: GI Code Status: Full  Pt Overview and Major Events to Date:  3/29: admitted for vomiting and bloody diarrhea.  Assessment and Plan: Eric Morton is a 49 y.o. male presenting with vomiting and bloody diarrhea . PMH is significant for HTN, DMII, ETOH abuse, Cocaine Abuse, Tobacco Abuse, Neuropathy.   Bloody Diarrhea: persistent diarrhea for > 2 months. Recently bloody for two weeks. 4 "jelly" bloody stools while in the ED. > 4 watery stools/day. No recent travel/sick contacts/antibiotics. Denies IBD history. Cocaine abuse. FOBT positive. CT abd with diffuse wall thickening consistent with Infectious vs. Inflammatory disease at the sigmoid colon. Ddx Infectious colitis vs. Colitis with upper GIB vs. ischemic colitis (cocaine abuse) vs inflammatory dz..  - Stool culture >> pending - C.Diff pcr >> neg - s/p 2L IVF - Carb modified diet >> per GI - Cipro / Flagyl emperically - Morphine for pain prn.  - HIV, Hep panel >> pending (hx substance abuse) - Mg, Phos levels, Lactate and LDH >> all wnl - Alk Phos: 145 >> 138 (3/30)  - GI consulted >> may want to perform EGD/colonoscopy - Protonix 18m QD PO  Vomiting: two times prior to admission. Reports bloody vomitus. Epigastric pain. Hx Alcoholism w/ recent decrease due to abdominal pain. No history of ulceration. Hgb stable. WBC normal. - PPI PO - advanced diet - Trend CBC - Zofran prn for nausea.   Polysubstance Abuse: known ETOH and Cocaine abuse. Cocaine and THC on UDS here. ETOH lvl < 5. Reports his last drink was 3 days ago and that he has been cutting back on his drinking. Also with tobacco abuse.  - CIWA for ETOH withdrawal - Does not want nicotine patch at this time.    Hypoalbuminemia: albumin 2.4 on admission.  - Nutritional supplementation  - Will monitor.   DMII - Pt. With A1C of 6.5 recently. On metformin 1002mdaily. Glucose 99 on admission. Neuropathy historically.  - Sensitive SSI - Holding metformin for now - Continue home gabapentin - monitor  HTN - Continue home lisinopril 2068maily for now.  - monitor  FEN/GI: Carb modified; taking good PO, no vomiting now Prophylaxis: SCD's given GI bleeding.   Disposition: pending workup   Subjective:  Patient feeling well this AM. Continues to have frequent diarrhea. Appetite good as is fluid intake.   Objective: Temp:  [97.4 F (36.3 C)-100.2 F (37.9 C)] 100.2 F (37.9 C) (03/30 1459) Pulse Rate:  [70-88] 86 (03/30 1459) Resp:  [16-18] 18 (03/30 1459) BP: (101-174)/(49-101) 101/49 mmHg (03/30 1459) SpO2:  [97 %-100 %] 100 % (03/30 1459) Weight:  [157 lb (71.215 kg)-160 lb (72.576 kg)] 157 lb (71.215 kg) (03/29 2236) Physical Exam: General: NAD, Alert, Ox3, cooperative HEENT: NCAT, Poor dentition, PERRLA, EOMI, MMM. Scleral icterus present.  Cardiovascular: RRR, No murmur appreciated.  Respiratory: CTAB, good expansion  Abdomen: Soft, TTP epigastric and lower quadrants/suprapubic, no rebound, No masses, + hyperactive bowel sounds.  Extremities: no edema, no effusions/erythema,  2+ distal pulses Skin: No rashes, no lesions.  Neuro: No focal deficits.   Laboratory:  Recent Labs Lab 04/25/14 1347 04/26/14 0510  WBC 8.3 6.5  HGB 16.3 16.2  HCT 47.5 48.9  PLT 235 225    Recent Labs Lab 04/25/14 1347 04/26/14 0510  NA  134* 140  K 3.7 4.0  CL 101 109  CO2 26 26  BUN 6 5*  CREATININE 0.93 0.92  CALCIUM 8.4 8.4  PROT 6.2 5.5*  BILITOT 0.7 1.0  ALKPHOS 145* 138*  ALT 40 42  AST 33 35  GLUCOSE 99 88   PCR neg Lactic acid 0.7 LDH 210 Mg 1.6 Phos  Phos 2.7 Stool culture pending Hepatitis pending  Imaging/Diagnostic Tests: CT Abd/pelvis  3/29 IMPRESSION: Diffuse wall thickening throughout the sigmoid colon most compatible with infectious or inflammatory colitis. Scattered distal descending colonic and sigmoid diverticulosis. Normal appendix.   Elberta Leatherwood, MD 04/26/2014, 3:32 PM PGY-1, [facility] Intern pager: [phone], text pages welcome

## 2014-04-27 LAB — CBC
HEMATOCRIT: 42.5 % (ref 39.0–52.0)
HEMOGLOBIN: 14.8 g/dL (ref 13.0–17.0)
MCH: 30.4 pg (ref 26.0–34.0)
MCHC: 34.8 g/dL (ref 30.0–36.0)
MCV: 87.3 fL (ref 78.0–100.0)
Platelets: 232 10*3/uL (ref 150–400)
RBC: 4.87 MIL/uL (ref 4.22–5.81)
RDW: 12.6 % (ref 11.5–15.5)
WBC: 7 10*3/uL (ref 4.0–10.5)

## 2014-04-27 LAB — BASIC METABOLIC PANEL WITH GFR
Anion gap: 7 (ref 5–15)
BUN: <5 mg/dL — ABNORMAL LOW (ref 6–23)
CO2: 21 mmol/L (ref 19–32)
Calcium: 8 mg/dL — ABNORMAL LOW (ref 8.4–10.5)
Chloride: 111 mmol/L (ref 96–112)
Creatinine, Ser: 0.92 mg/dL (ref 0.50–1.35)
GFR calc Af Amer: >90 mL/min
GFR calc non Af Amer: >90 mL/min
Glucose, Bld: 92 mg/dL (ref 70–99)
Potassium: 3.3 mmol/L — ABNORMAL LOW (ref 3.5–5.1)
Sodium: 139 mmol/L (ref 135–145)

## 2014-04-27 LAB — GLUCOSE, CAPILLARY
GLUCOSE-CAPILLARY: 136 mg/dL — AB (ref 70–99)
Glucose-Capillary: 135 mg/dL — ABNORMAL HIGH (ref 70–99)
Glucose-Capillary: 81 mg/dL (ref 70–99)
Glucose-Capillary: 81 mg/dL (ref 70–99)

## 2014-04-27 LAB — HEPATITIS B CORE ANTIBODY, IGM: Hep B C IgM: NONREACTIVE

## 2014-04-27 LAB — HIV ANTIBODY (ROUTINE TESTING W REFLEX): HIV SCREEN 4TH GENERATION: NONREACTIVE

## 2014-04-27 MED ORDER — PEG-KCL-NACL-NASULF-NA ASC-C 100 G PO SOLR
1.0000 | Freq: Once | ORAL | Status: AC
  Administered 2014-04-27: 200 g via ORAL
  Filled 2014-04-27: qty 1

## 2014-04-27 NOTE — Progress Notes (Signed)
Daily Rounding Note  04/27/2014, 8:34 AM  LOS: 2 days   SUBJECTIVE:       At least 3 incontinent diarrheal stools overnitght, 2 this AM.  No abdominal pain.  No nausea, disappointed that he will have to go back on clears for bowel prep  OBJECTIVE:         Vital signs in last 24 hours:    Temp:  [97.5 F (36.4 C)-100.2 F (37.9 C)] 97.5 F (36.4 C) (03/31 0532) Pulse Rate:  [71-86] 71 (03/31 0532) Resp:  [16-18] 18 (03/31 0532) BP: (101-132)/(49-74) 132/69 mmHg (03/31 0532) SpO2:  [99 %-100 %] 100 % (03/31 0532) Weight:  [159 lb 1.6 oz (72.167 kg)] 159 lb 1.6 oz (72.167 kg) (03/31 0532) Last BM Date: 04/26/14 Filed Weights   04/25/14 1631 04/25/14 2236 04/27/14 0532  Weight: 160 lb (72.576 kg) 157 lb (71.215 kg) 159 lb 1.6 oz (72.167 kg)   General: pleasant, polite, not acutely ill.    Heart: RRR. Chest: clear bil.  No dyspnea or cough Abdomen: soft, NT, ND, active BS  Extremities: no CCE Neuro/Psych:  Pleasant, cooperative, not confused.   Intake/Output from previous day: 03/30 0701 - 03/31 0700 In: 1060 [P.O.:760; IV Piggyback:300] Out: -   Intake/Output this shift:    Lab Results:  Recent Labs  04/25/14 1347 04/26/14 0510  WBC 8.3 6.5  HGB 16.3 16.2  HCT 47.5 48.9  PLT 235 225   BMET  Recent Labs  04/25/14 1347 04/26/14 0510  NA 134* 140  K 3.7 4.0  CL 101 109  CO2 26 26  GLUCOSE 99 88  BUN 6 5*  CREATININE 0.93 0.92  CALCIUM 8.4 8.4   LFT  Recent Labs  04/25/14 1347 04/26/14 0510  PROT 6.2 5.5*  ALBUMIN 2.4* 2.3*  AST 33 35  ALT 40 42  ALKPHOS 145* 138*  BILITOT 0.7 1.0   PT/INR No results for input(s): LABPROT, INR in the last 72 hours. Hepatitis Panel  Recent Labs  04/26/14 1120  HCVAB NEGATIVE  HEPBIGM Negative*    Studies/Results: Ct Abdomen Pelvis W Contrast  04/25/2014   CLINICAL DATA:  Bloody diarrhea for 2 months. Lower abdominal pain for 2-3 weeks.   EXAM: CT ABDOMEN AND PELVIS WITH CONTRAST  TECHNIQUE: Multidetector CT imaging of the abdomen and pelvis was performed using the standard protocol following bolus administration of intravenous contrast.  CONTRAST:  163m OMNIPAQUE IOHEXOL 300 MG/ML  SOLN  COMPARISON:  07/16/2006  FINDINGS: Lower chest: Lung bases are clear. No effusions. Heart is normal size.  Hepatobiliary: No biliary ductal dilatation or focal hepatic lesion. Gallbladder grossly unremarkable.  Pancreas: No focal abnormality or ductal dilatation.  Spleen: No focal abnormality.  Normal size.  Adrenals/Urinary Tract: No focal renal or adrenal abnormality. No hydronephrosis. Urinary bladder is unremarkable.  Stomach/Bowel: Stomach and small bowel are decompressed and unremarkable. There is diffuse sigmoid wall thickening. There are scattered diverticula. However, due to the diffuse nature of the wall thickening, I favor this represents colitis rather than diverticulitis. Mild stranding around the sigmoid colon. Appendix is visualized and unremarkable.  Vascular/Lymphatic: No retroperitoneal or mesenteric adenopathy. Aorta normal caliber.  Reproductive: No mass or other significant abnormality. Densely calcified seminal vesicles.  Other: No free fluid or free air.  Musculoskeletal: No focal bone lesion or acute bony abnormality.  IMPRESSION: Diffuse wall thickening throughout the sigmoid colon most compatible with infectious or inflammatory colitis.  Scattered distal descending  colonic and sigmoid diverticulosis.  Normal appendix.   Electronically Signed   By: Rolm Baptise M.D.   On: 04/25/2014 19:42   Scheduled Meds: . antiseptic oral rinse  7 mL Mouth Rinse q12n4p  . chlorhexidine  15 mL Mouth Rinse BID  . ciprofloxacin  400 mg Intravenous Q12H  . feeding supplement (RESOURCE BREEZE)  1 Container Oral TID BM  . folic acid  1 mg Oral Daily  . gabapentin  800 mg Oral Q12H  . insulin aspart  0-15 Units Subcutaneous TID WC  . lisinopril  20 mg  Oral Daily  . metronidazole  500 mg Intravenous Q8H  . multivitamin with minerals  1 tablet Oral Daily  . ondansetron (ZOFRAN) IV  4 mg Intravenous 4 times per day  . pantoprazole  40 mg Oral Q0600  . thiamine  100 mg Oral Daily   Continuous Infusions:  PRN Meds:.acetaminophen **OR** acetaminophen, LORazepam **OR** LORazepam, morphine injection  ASSESMENT:   * Bloody diarrhea, sigmoid colitis on CT. C diff negative 3/14, 3/29. Lactic acid not elevated.  Day 2-3 empiric Flagyl/Cipro  Stool clx processing. TSH normal.   * Type 2 DM. A1C 6.5 one week ago.   * Polysubstance/ETOH abuse. Says he has had no etoh for ~ 6 weeks.  HIV non-reactive. Hep B core IgM and HCV Ab negative.   * Hx PUD? Chronic, mild epigastric distress, no response to H2 blocker. No previous EGD/UGI series.   * Elevated alk phos, transaminases/lipase normal.   * Hypoalbuminemia. 3# weight loss in past 3 weeks.   * Anxiety.   *  Dysuria, U/A negative    PLAN   *  Will arrange colonoscopy for tomorrow. Split dose prep.   *  Stopping the cipro and Flagyl,  Do not think this is infectious.       Eric Morton  04/27/2014, 8:34 AM Pager: [phone]  [facility] Attending  I have also seen and assessed the patient and agree with the advanced practitioner's assessment and plan. Colonoscopy to investigate chronic diarrhea and colitis on CT  Gatha Mayer, MD, [facility] [phone] (pager) 04/27/2014 12:41 PM

## 2014-04-27 NOTE — Anesthesia Preprocedure Evaluation (Addendum)
Anesthesia Evaluation  Patient identified by MRN, date of birth, ID band Patient awake    Reviewed: Allergy & Precautions, NPO status , Patient's Chart, lab work & pertinent test results  History of Anesthesia Complications Negative for: history of anesthetic complications  Airway Mallampati: II  TM Distance: >3 FB Neck ROM: Full    Dental no notable dental hx. (+) Dental Advisory Given, Poor Dentition, Chipped, Missing   Pulmonary Current Smoker,  breath sounds clear to auscultation  Pulmonary exam normal       Cardiovascular hypertension, Pt. on medications Rhythm:Regular Rate:Normal     Neuro/Psych PSYCHIATRIC DISORDERS Anxiety Depression negative neurological ROS     GI/Hepatic negative GI ROS, (+)     substance abuse (reports last cocaine use 6 days ago and last etoh the same day. Denies any withdrawl  symptoms)  alcohol use, cocaine use and marijuana use,   Endo/Other  diabetes, Type 2  Renal/GU negative Renal ROS  negative genitourinary   Musculoskeletal negative musculoskeletal ROS (+)   Abdominal   Peds negative pediatric ROS (+)  Hematology negative hematology ROS (+)   Anesthesia Other Findings   Reproductive/Obstetrics negative OB ROS                           Anesthesia Physical Anesthesia Plan  ASA: III and emergent  Anesthesia Plan: MAC   Post-op Pain Management:    Induction: Intravenous  Airway Management Planned: Nasal Cannula  Additional Equipment:   Intra-op Plan:   Post-operative Plan:   Informed Consent: I have reviewed the patients History and Physical, chart, labs and discussed the procedure including the risks, benefits and alternatives for the proposed anesthesia with the patient or authorized representative who has indicated his/her understanding and acceptance.   Dental advisory given  Plan Discussed with: CRNA, Anesthesiologist and  Surgeon  Anesthesia Plan Comments:        Anesthesia Quick Evaluation

## 2014-04-27 NOTE — Progress Notes (Signed)
Family Medicine Teaching Service Daily Progress Note Intern Pager: [phone]  Patient name: Eric Morton Medical record number: [id number] Date of birth: [birth year] Age: 49 y.o. Gender: male  Primary Care Provider: Default, Provider, MD Consultants: GI Code Status: Full  Pt Overview and Major Events to Date:  3/29: admitted for vomiting and bloody diarrhea.  Assessment and Plan: NOBEL BRAR is a 49 y.o. male presenting with vomiting and bloody diarrhea . PMH is significant for HTN, DMII, ETOH abuse, Cocaine Abuse, Tobacco Abuse, Neuropathy.   #Bloody Diarrhea: persistent diarrhea for > 2 months. Denies IBD history. Cocaine abuse. FOBT positive. CT abd with diffuse wall thickening consistent with Infectious vs. Inflammatory disease at the sigmoid colon. Ddx Infectious colitis vs. Colitis with upper GIB vs. ischemic colitis (cocaine abuse) vs inflammatory dz >> GI suspects inflammatory process. - Stool culture >> pending (neg to date) - C.Diff pcr >> neg - Clear liquid diet - Cipro / Flagyl >> DCd 3/31 - Morphine for pain prn.  - HIV neg, HCV neg, HBV core neg, HBV surface pending - Mg, Phos levels, Lactate and LDH >> all wnl - Alk Phos: 145 >> 138 (3/30)  - GI consulted >> bowel prep today (3/31); colonoscopy for tomorrow. - Protonix 42m QD PO  #Vomiting: two times prior to admission. Reports bloody vomitus. Epigastric pain. Hx Alcoholism w/ recent decrease due to abdominal pain. No history of ulceration. Hgb stable. WBC normal. - No episodes during this admission - PPI PO - reduced to clear liquid today due to colonoscopy today - Trend CBC - Zofran prn for nausea.   #Polysubstance Abuse: known ETOH and Cocaine abuse. Cocaine and THC on UDS here. ETOH lvl < 5. Reports his last drink was 3 days ago and that he has been cutting back on his drinking. Also with tobacco abuse.  - CIWA for ETOH withdrawal: 0 >>0 >> 0 >> 1 - Does not want nicotine patch at this time.    #Hypoalbuminemia: albumin 2.4 on admission.  - Nutritional supplementation  - Will monitor.   #DMII - Pt. With A1C of 6.5 recently. On metformin 10022mdaily. Glucose 99 on admission. Neuropathy historically.  - Sensitive SSI - Holding metformin for now - Continue home gabapentin - monitor  #HTN - Continue home lisinopril 2051maily for now.  - monitor  FEN/GI: Carb modified; taking good PO, no vomiting now Prophylaxis: SCD's given GI bleeding.   Disposition: pending workup   Subjective:  Patient feeling well this AM. Continues to have frequent diarrhea. No other issues at this time. Had sudden onset feeling the need to have a BM while I was present. Patient was able refrain from BM at that time but intestinal activity was audible from a distance at bedside.  Objective: Temp:  [97.5 F (36.4 C)-100.2 F (37.9 C)] 97.7 F (36.5 C) (03/31 1442) Pulse Rate:  [69-86] 69 (03/31 1442) Resp:  [16-18] 18 (03/31 1442) BP: (101-149)/(49-84) 149/84 mmHg (03/31 1442) SpO2:  [99 %-100 %] 100 % (03/31 1442) Weight:  [159 lb 1.6 oz (72.167 kg)] 159 lb 1.6 oz (72.167 kg) (03/31 0532) Physical Exam: General: NAD, Alert, Ox3, cooperative HEENT: NCAT, Poor dentition, PERRLA, EOMI, MMM. Scleral icterus present.  Cardiovascular: RRR, No murmur appreciated.  Respiratory: CTAB, good expansion  Abdomen: Soft, TTP epigastric and lower quadrants/suprapubic, no rebound, No masses, + hyperactive bowel sounds.  Extremities: no edema, no effusions/erythema,  2+ distal pulses Skin: No rashes, no lesions.  Neuro: No focal deficits.  Laboratory:  Recent Labs Lab 04/25/14 1347 04/26/14 0510 04/27/14 1020  WBC 8.3 6.5 7.0  HGB 16.3 16.2 14.8  HCT 47.5 48.9 42.5  PLT 235 225 232    Recent Labs Lab 04/25/14 1347 04/26/14 0510 04/27/14 1020  NA 134* 140 139  K 3.7 4.0 3.3*  CL 101 109 111  CO2 _0 BUN 6 5* <5*  CREATININE 0.93 0.92 0.92  CALCIUM 8.4 8.4 8.0*  PROT  6.2 5.5*  --   BILITOT 0.7 1.0  --   ALKPHOS 145* 138*  --   ALT 40 42  --   AST 33 35  --   GLUCOSE 99 88 92   PCR neg Lactic acid 0.7 LDH 210 Mg 1.6 Phos  Phos 2.7 Stool culture pending (neg to date) Hepatitis: see above  Imaging/Diagnostic Tests: CT Abd/pelvis 3/29 IMPRESSION: Diffuse wall thickening throughout the sigmoid colon most compatible with infectious or inflammatory colitis. Scattered distal descending colonic and sigmoid diverticulosis. Normal appendix.   Elberta Leatherwood, MD 04/27/2014, 2:44 PM PGY-1, [place] Intern pager: [phone], text pages welcome

## 2014-04-28 ENCOUNTER — Encounter (HOSPITAL_COMMUNITY)
Admission: EM | Disposition: A | Discharge status: Home / Self Care | Payer: Self-pay | Source: Home / Self Care | Attending: Family Medicine

## 2014-04-28 ENCOUNTER — Inpatient Hospital Stay (HOSPITAL_COMMUNITY): Payer: Self-pay | Admitting: Anesthesiology

## 2014-04-28 ENCOUNTER — Encounter (HOSPITAL_COMMUNITY): Payer: Self-pay | Admitting: *Deleted

## 2014-04-28 ENCOUNTER — Inpatient Hospital Stay (HOSPITAL_COMMUNITY): Payer: MEDICAID | Admitting: Anesthesiology

## 2014-04-28 DIAGNOSIS — Z86010 Personal history of colonic polyps: Secondary | ICD-10-CM | POA: Insufficient documentation

## 2014-04-28 DIAGNOSIS — D122 Benign neoplasm of ascending colon: Secondary | ICD-10-CM

## 2014-04-28 DIAGNOSIS — D125 Benign neoplasm of sigmoid colon: Principal | ICD-10-CM

## 2014-04-28 DIAGNOSIS — D124 Benign neoplasm of descending colon: Secondary | ICD-10-CM

## 2014-04-28 DIAGNOSIS — Z860101 Personal history of adenomatous and serrated colon polyps: Secondary | ICD-10-CM | POA: Insufficient documentation

## 2014-04-28 DIAGNOSIS — D123 Benign neoplasm of transverse colon: Secondary | ICD-10-CM

## 2014-04-28 HISTORY — PX: COLONOSCOPY WITH PROPOFOL: SHX5780

## 2014-04-28 LAB — COMPREHENSIVE METABOLIC PANEL
ALBUMIN: 2.1 g/dL — AB (ref 3.5–5.2)
ALT: 30 U/L (ref 0–53)
AST: 30 U/L (ref 0–37)
Alkaline Phosphatase: 107 U/L (ref 39–117)
Anion gap: 6 (ref 5–15)
BILIRUBIN TOTAL: 0.6 mg/dL (ref 0.3–1.2)
BUN: <5 mg/dL — ABNORMAL LOW (ref 6–23)
CHLORIDE: 111 mmol/L (ref 96–112)
CO2: 21 mmol/L (ref 19–32)
Calcium: 8.1 mg/dL — ABNORMAL LOW (ref 8.4–10.5)
Creatinine, Ser: 0.89 mg/dL (ref 0.50–1.35)
GFR calc Af Amer: >90 mL/min (ref >90)
Glucose, Bld: 74 mg/dL (ref 70–99)
POTASSIUM: 3.3 mmol/L — AB (ref 3.5–5.1)
SODIUM: 138 mmol/L (ref 135–145)
Total Protein: 5.3 g/dL — ABNORMAL LOW (ref 6.0–8.3)

## 2014-04-28 LAB — HEPATITIS B SURFACE ANTIBODY,QUALITATIVE: Hep B S Ab: NEGATIVE

## 2014-04-28 LAB — CBC
HCT: 42.6 % (ref 39.0–52.0)
HEMOGLOBIN: 14.5 g/dL (ref 13.0–17.0)
MCH: 29.2 pg (ref 26.0–34.0)
MCHC: 34 g/dL (ref 30.0–36.0)
MCV: 85.9 fL (ref 78.0–100.0)
PLATELETS: 206 10*3/uL (ref 150–400)
RBC: 4.96 MIL/uL (ref 4.22–5.81)
RDW: 12.6 % (ref 11.5–15.5)
WBC: 4.6 10*3/uL (ref 4.0–10.5)

## 2014-04-28 LAB — GLUCOSE, CAPILLARY
GLUCOSE-CAPILLARY: 111 mg/dL — AB (ref 70–99)
Glucose-Capillary: 71 mg/dL (ref 70–99)

## 2014-04-28 SURGERY — COLONOSCOPY WITH PROPOFOL
Anesthesia: Monitor Anesthesia Care

## 2014-04-28 MED ORDER — SODIUM CHLORIDE 0.9 % IV SOLN
INTRAVENOUS | Status: DC
  Administered 2014-04-28: 11:00:00 via INTRAVENOUS
  Filled 2014-04-28: qty 1000

## 2014-04-28 MED ORDER — LISINOPRIL 40 MG PO TABS
40.0000 mg | ORAL_TABLET | Freq: Every day | ORAL | Status: DC
  Administered 2014-04-28: 40 mg via ORAL
  Filled 2014-04-28: qty 1

## 2014-04-28 MED ORDER — FENTANYL CITRATE 0.05 MG/ML IJ SOLN: 25.0000 ug | INTRAMUSCULAR | Status: DC | PRN

## 2014-04-28 MED ORDER — LACTATED RINGERS IV SOLN: INTRAVENOUS | Status: DC

## 2014-04-28 MED ORDER — PANTOPRAZOLE SODIUM 40 MG PO TBEC: 40.0000 mg | DELAYED_RELEASE_TABLET | Freq: Every day | ORAL | Status: DC

## 2014-04-28 MED ORDER — PROPOFOL 10 MG/ML IV BOLUS
INTRAVENOUS | Status: DC | PRN
  Administered 2014-04-28 (×9): 20 mg via INTRAVENOUS

## 2014-04-28 MED ORDER — SODIUM CHLORIDE 0.9 % IV SOLN
INTRAVENOUS | Status: DC | PRN
  Administered 2014-04-28: 13:00:00 via INTRAVENOUS

## 2014-04-28 MED ORDER — HYDRALAZINE HCL 20 MG/ML IJ SOLN
10.0000 mg | Freq: Four times a day (QID) | INTRAMUSCULAR | Status: DC | PRN
  Administered 2014-04-28: 10 mg via INTRAVENOUS
  Filled 2014-04-28: qty 1

## 2014-04-28 MED ORDER — MIDAZOLAM HCL 5 MG/5ML IJ SOLN
INTRAMUSCULAR | Status: DC | PRN
  Administered 2014-04-28 (×2): 1 mg via INTRAVENOUS

## 2014-04-28 MED ORDER — SPOT INK MARKER SYRINGE KIT
PACK | SUBMUCOSAL | Status: AC
  Filled 2014-04-28: qty 5

## 2014-04-28 MED ORDER — LIDOCAINE HCL (CARDIAC) 20 MG/ML IV SOLN
INTRAVENOUS | Status: DC | PRN
  Administered 2014-04-28: 40 mg via INTRAVENOUS

## 2014-04-28 MED ORDER — SODIUM CHLORIDE 0.9 % IV SOLN
INTRAVENOUS | Status: DC
  Administered 2014-04-28: 14:00:00 via INTRAVENOUS

## 2014-04-28 MED ORDER — EPINEPHRINE HCL 0.1 MG/ML IJ SOSY
PREFILLED_SYRINGE | INTRAMUSCULAR | Status: AC
  Filled 2014-04-28: qty 10

## 2014-04-28 MED ORDER — ONDANSETRON HCL 4 MG/2ML IJ SOLN: 4.0000 mg | Freq: Once | INTRAMUSCULAR | Status: DC | PRN

## 2014-04-28 MED ORDER — POTASSIUM CHLORIDE CRYS ER 20 MEQ PO TBCR
30.0000 meq | EXTENDED_RELEASE_TABLET | Freq: Two times a day (BID) | ORAL | Status: DC
  Administered 2014-04-28: 30 meq via ORAL
  Filled 2014-04-28 (×2): qty 1

## 2014-04-28 NOTE — Transfer of Care (Signed)
Immediate Anesthesia Transfer of Care Note  Patient: Eric Morton  Procedure(s) Performed: Procedure(s): COLONOSCOPY WITH PROPOFOL (N/A)  Patient Location: PACU and Endoscopy Unit  Anesthesia Type:MAC  Level of Consciousness: awake, alert , oriented and sedated  Airway & Oxygen Therapy: Patient Spontanous Breathing and Patient connected to nasal cannula oxygen  Post-op Assessment: Report given to RN, Post -op Vital signs reviewed and stable and Patient moving all extremities  Post vital signs: Reviewed and stable  Last Vitals:  Filed Vitals:   04/28/14 1121  BP: 191/103  Pulse: 89  Temp: 36.8 C  Resp: 15    Complications: No apparent anesthesia complications

## 2014-04-28 NOTE — Discharge Instructions (Signed)
Please do not use Aspirin or other NSAIDs until contacted by Gastroenterology. Gastroenterology will contact you concerning your colonoscopy results and follow-up appointment.  Colon Polyps Polyps are lumps of extra tissue growing inside the body. Polyps can grow in the large intestine (colon). Most colon polyps are noncancerous (benign). However, some colon polyps can become cancerous over time. Polyps that are larger than a pea may be harmful. To be safe, caregivers remove and test all polyps. CAUSES  Polyps form when mutations in the genes cause your cells to grow and divide even though no more tissue is needed. RISK FACTORS There are a number of risk factors that can increase your chances of getting colon polyps. They include:  Being older than 50 years.  Family history of colon polyps or colon cancer.  Long-term colon diseases, such as colitis or Crohn disease.  Being overweight.  Smoking.  Being inactive.  Drinking too much alcohol. SYMPTOMS  Most small polyps do not cause symptoms. If symptoms are present, they may include:  Blood in the stool. The stool may look dark red or black.  Constipation or diarrhea that lasts longer than 1 week. DIAGNOSIS People often do not know they have polyps until their caregiver finds them during a regular checkup. Your caregiver can use 4 tests to check for polyps:  Digital rectal exam. The caregiver wears gloves and feels inside the rectum. This test would find polyps only in the rectum.  Barium enema. The caregiver puts a liquid called barium into your rectum before taking X-rays of your colon. Barium makes your colon look white. Polyps are dark, so they are easy to see in the X-ray pictures.  Sigmoidoscopy. A thin, flexible tube (sigmoidoscope) is placed into your rectum. The sigmoidoscope has a light and tiny camera in it. The caregiver uses the sigmoidoscope to look at the last third of your colon.  Colonoscopy. This test is like  sigmoidoscopy, but the caregiver looks at the entire colon. This is the most common method for finding and removing polyps. TREATMENT  Any polyps will be removed during a sigmoidoscopy or colonoscopy. The polyps are then tested for cancer. PREVENTION  To help lower your risk of getting more colon polyps:  Eat plenty of fruits and vegetables. Avoid eating fatty foods.  Do not smoke.  Avoid drinking alcohol.  Exercise every day.  Lose weight if recommended by your caregiver.  Eat plenty of calcium and folate. Foods that are rich in calcium include milk, cheese, and broccoli. Foods that are rich in folate include chickpeas, kidney beans, and spinach. HOME CARE INSTRUCTIONS Keep all follow-up appointments as directed by your caregiver. You may need periodic exams to check for polyps. SEEK MEDICAL CARE IF: You notice bleeding during a bowel movement. Document Released: 10/10/2003 Document Revised: 04/07/2011 Document Reviewed: 03/25/2011 [facility] Patient Information 2015 [place], [place]. This information is not intended to replace advice given to you by your health care provider. Make sure you discuss any questions you have with your health care provider.

## 2014-04-28 NOTE — Progress Notes (Signed)
Spoke with family medicine MD covering [phone], MD stated patient is ok for discharge.

## 2014-04-28 NOTE — Progress Notes (Signed)
NURSING PROGRESS NOTE  Eric Morton [id number] Discharge Data: 04/28/2014 5:43 PM Attending Provider: Zenia Resides, MD [id number], Provider, MD   [name] to be D/C'd Home per MD order.    All IV's will be discontinued and monitored for bleeding.  All belongings will be returned to patient for patient to take home.  Last Documented Vital Signs:  Blood pressure 145/90, pulse 89, temperature 97.5 F (36.4 C), temperature source Oral, resp. rate 16, height 5\' 7"  (1.702 m), weight 72.167 kg (159 lb 1.6 oz), SpO2 100 %.  Hendricks Limes RN, BS, BSN

## 2014-04-28 NOTE — Op Note (Signed)
[facility] [place], [address]   COLONOSCOPY PROCEDURE REPORT  PATIENT: Eric Eric Morton, Eric Morton  MR#: [id number] BIRTHDATE: [birth year] , 49  yrs. old GENDER: male ENDOSCOPIST: Gatha Mayer, MD, [facility] PROCEDURE DATE:  04/28/2014 PROCEDURE:   Colonoscopy, diagnostic, Colonoscopy with snare polypectomy, and Submucosal injection, any substance First Screening Colonoscopy - Avg.  risk and is 50 yrs.  old or older - No.  Prior Negative Screening - Now for repeat screening. N/A  History of Adenoma - Now for follow-up colonoscopy & has been > or = to 3 yrs.  N/A ASA CLASS:   Class III INDICATIONS:Evaluation of unexplained GI bleeding - segmental colitis on CT and bloody diarrhea x 8 weeks and Colorectal Neoplasm Risk Assessment for this procedure is average risk. MEDICATIONS: Monitored anesthesia care and Per Anesthesia  DESCRIPTION OF PROCEDURE:   After the risks benefits and alternatives of the procedure were thoroughly explained, informed consent was obtained.  The digital rectal exam revealed no rectal mass, revealed no prostatic nodules, revealed the prostate was not enlarged, and revealed decreased sphincter tone.   The [employer] Adult Colon [id number]  endoscope was introduced through the anus and advanced to the cecum, which was identified by both the appendix and ileocecal valve. No adverse events experienced.   The quality of the prep was adequate (MoviPrep was used)  The instrument was then slowly withdrawn as the colon was fully examined.  COLON FINDINGS: 1) 2.5-3 cm pedunculated sigmoid polyp - on thick long stalk.  Pre-injected with 4 cc EPI and then snared after shrinking.  snared at stalk.  sent to pathology 1 piece.  Source of bloody stools and probably the diarrhea (mucous release perhaps) 2) 15 mm ascending and transverse polyps hot snared and removed - to pathology 3) Dimnutive ascending, descending and sigmoid polyps all cold snared and  sent to pathology.  4) Left-side diverticulosis 5) Otherwise NL colonoscopy - adequate prep - no colitis as described on CT - probably was this large sigmoid polyp with long thick stalk.  Retroflexed views revealed no abnormalities. The time to cecum = 9.6 Withdrawal time = 31.7   The scope was withdrawn and the procedure completed. COMPLICATIONS: There were no immediate complications.  ENDOSCOPIC IMPRESSION: 1) 2.5-3 cm pedunculated sigmoid polyp - on thick long stalk. Pre-injected with 4 cc EPI and then snared after shrinking.  snared at stalk.  sent to pathology 1 piece.  Source of bloody stools and probably the diarrhea (mucous release perhaps) 2) 15 mm ascending and transverse polyps hot snared and removed - to pathology 3) Dimnutive ascending, descending and sigmoid polyps all cold snared and sent to pathology. 4) Left-side diverticulosis 5) Otherwise NL colonoscopy - adequate prep - no colitis as described on CT - probably was this large sigmoid polyp with long thick stalk  RECOMMENDATIONS: 1.  Hold Aspirin and all other NSAIDS for 2 weeks. 2.  Timing of repeat colonoscopy will be determined by pathology findings. 3.  Home today/tomorrow unless other issues I will contact him with pathology results and plans  eSigned:  Gatha Mayer, MD, [facility] 04/28/2014 1:47 PM   cc: The Patient   PATIENT NAME:  Eric Eric Morton, Eric Morton MR#: [id number]

## 2014-04-28 NOTE — Discharge Summary (Signed)
[facility] Discharge Summary  Patient name: Eric Morton Medical record number: [id number] Date of birth: 30-Jan-1965 Age: 49 y.o. Gender: male Date of Admission: 04/25/2014  Date of Discharge: 04/28/14 Admitting Physician: Zenia Resides, MD  Primary Care Provider: Default, Provider, MD Consultants: Gastroenterology  Indication for Hospitalization: GI Bleed  Discharge Diagnoses/Problem List:  Polyps on Colonoscopy Diarrhea Polysubstance Abuse Hypoalbuminemia Diabetes Type 2 HTN  Disposition: Discharge Home  Discharge Condition: Stable  Discharge Exam: Please refer to Progress Note 04/28/14  Brief Hospital Course:  Eric Morton is a 49yo male who presented to the Emergency Department on 3/29 with greater than 2 months of persistent diarrhea and blood diarrhea for two weeks. Also reported two episodes of blood vomitus prior to admission. No further episodes of vomiting were noted throughout hospital course. No prior history of colonoscopy. Lipase normal at 18 and ALP elevated at 145. Electrolytes normal. Hemoglobin 16 at admission, stable at 14.5 at discharge.  CT abdomen with diffuse wall thickening consistent with infectious or inflammatory disease at the sigmoid colon. Stool culture negative. Clostridium difficile negative. NS bolus given and maintenance IV fluids throughout hospital course. Course of Ciprofloxacin and Flagyl initiated on 3/29 and discontinued on 3/31. Gastroenterology consulted and recommended colonoscopy, which showed 2.5-3cm pedunculated sigmoid polyp on thick long stalk, 83mm ascending and transverse colon polyps, diminutive ascending, descending, and sigmoid polyps, left sided diverticulosis; no colitis noted.  Protonix 40mg  PO.   Albumin at admission of 2.4. Suspected to be due to chronic diarrhea. Nutrition consulted.   Placed on CIWA protocol during hospitalization due to history of alcohol abuse. CIWA scores 0-1, will scores due to  anxiety.   Potassium low at 3.3 on 3/31 and 4/1. Repleted with KDur.  Issues for Follow Up:  1. Follow up GI bleed. Gastroenterology to contact with pathology results and schedule follow up appointment. Discharged on Protonix 40mg  daily. 2. Follow up HTN. Elevated BP during hospitalization associated with anxiety.  3. Follow up nutrition. Albumin 2.1 at discharge.  Significant Procedures: Colonoscopy  Significant Labs and Imaging:   Recent Labs Lab 04/26/14 0510 04/27/14 1020 04/28/14 0848  WBC 6.5 7.0 4.6  HGB 16.2 14.8 14.5  HCT 48.9 42.5 42.6  PLT 225 232 206    Recent Labs Lab 04/25/14 1347 04/25/14 2253 04/26/14 0510 04/27/14 1020 04/28/14 0848  NA 134*  --  140 139 138  K 3.7  --  4.0 3.3* 3.3*  CL 101  --  109 111 111  CO2 26  --  26 21 21   GLUCOSE 99  --  88 92 74  BUN 6  --  5* <5* <5*  CREATININE 0.93  --  0.92 0.92 0.89  CALCIUM 8.4  --  8.4 8.0* 8.1*  MG  --  1.6  --   --   --   PHOS  --  2.7  --   --   --   ALKPHOS 145*  --  138*  --  107  AST 33  --  35  --  30  ALT 40  --  42  --  30  ALBUMIN 2.4*  --  2.3*  --  2.1*   Urinalysis    Component Value Date/Time   COLORURINE AMBER* 04/25/2014 1751   APPEARANCEUR CLOUDY* 04/25/2014 1751   LABSPEC 1.025 04/25/2014 1751   PHURINE 5.5 04/25/2014 1751   GLUCOSEU NEGATIVE 04/25/2014 1751   HGBUR NEGATIVE 04/25/2014 1751   BILIRUBINUR SMALL* 04/25/2014  1751   KETONESUR 15* 04/25/2014 1751   PROTEINUR 30* 04/25/2014 1751   UROBILINOGEN 1.0 04/25/2014 1751   NITRITE NEGATIVE 04/25/2014 1751   LEUKOCYTESUR NEGATIVE 04/25/2014 1751  - LDH 210 - Lactic Acid 0.7 - TSH 1.084 - UDS positive Cocaine, THC - FOBT positive  Dg Chest 2 View  03/30/2014   CLINICAL DATA:  Left hand and arm numbness today. Initial encounter.  EXAM: CHEST  2 VIEW  COMPARISON:  None.  FINDINGS: The heart size and mediastinal contours are normal. The lungs are clear. There is no pleural effusion or pneumothorax. No acute osseous  findings are identified.  IMPRESSION: No active cardiopulmonary process.   Electronically Signed   By: Richardean Sale M.D.   On: 03/30/2014 19:17   Ct Abdomen Pelvis W Contrast  04/25/2014   CLINICAL DATA:  Bloody diarrhea for 2 months. Lower abdominal pain for 2-3 weeks.  EXAM: CT ABDOMEN AND PELVIS WITH CONTRAST  TECHNIQUE: Multidetector CT imaging of the abdomen and pelvis was performed using the standard protocol following bolus administration of intravenous contrast.  CONTRAST:  124mL OMNIPAQUE IOHEXOL 300 MG/ML  SOLN  COMPARISON:  07/16/2006  FINDINGS: Lower chest: Lung bases are clear. No effusions. Heart is normal size.  Hepatobiliary: No biliary ductal dilatation or focal hepatic lesion. Gallbladder grossly unremarkable.  Pancreas: No focal abnormality or ductal dilatation.  Spleen: No focal abnormality.  Normal size.  Adrenals/Urinary Tract: No focal renal or adrenal abnormality. No hydronephrosis. Urinary bladder is unremarkable.  Stomach/Bowel: Stomach and small bowel are decompressed and unremarkable. There is diffuse sigmoid wall thickening. There are scattered diverticula. However, due to the diffuse nature of the wall thickening, I favor this represents colitis rather than diverticulitis. Mild stranding around the sigmoid colon. Appendix is visualized and unremarkable.  Vascular/Lymphatic: No retroperitoneal or mesenteric adenopathy. Aorta normal caliber.  Reproductive: No mass or other significant abnormality. Densely calcified seminal vesicles.  Other: No free fluid or free air.  Musculoskeletal: No focal bone lesion or acute bony abnormality.  IMPRESSION: Diffuse wall thickening throughout the sigmoid colon most compatible with infectious or inflammatory colitis.  Scattered distal descending colonic and sigmoid diverticulosis.  Normal appendix.   Electronically Signed   By: Rolm Baptise M.D.   On: 04/25/2014 19:42   Dg Abd Acute W/chest  04/10/2014   CLINICAL DATA:  Subacute onset of  diarrhea for 3 weeks. Rectal bleeding. Initial encounter.  EXAM: ACUTE ABDOMEN SERIES (ABDOMEN 2 VIEW & CHEST 1 VIEW)  COMPARISON:  Chest radiograph performed 03/30/2014, and CT of the abdomen and pelvis performed 07/16/2006  FINDINGS: The lungs are well-aerated and clear. There is no evidence of focal opacification, pleural effusion or pneumothorax. The cardiomediastinal silhouette is within normal limits.  The visualized bowel gas pattern is unremarkable. Scattered stool and air are seen within the colon; there is no evidence of small bowel dilatation to suggest obstruction. No free intra-abdominal air is identified on the provided upright view.  No acute osseous abnormalities are seen; the sacroiliac joints are unremarkable in appearance. Scattered vascular calcifications are seen within the pelvis.  IMPRESSION: 1. Unremarkable bowel gas pattern; no free intra-abdominal air seen. Small amount of stool noted in the colon. 2. No acute cardiopulmonary process seen.   Electronically Signed   By: Garald Balding M.D.   On: 04/10/2014 19:29   Results/Tests Pending at Time of Discharge: Final Stool Culture Results, Colonoscopy Pathology  Discharge Medications:    Medication List    STOP taking these medications  famotidine 10 MG chewable tablet  Commonly known as:  PEPCID AC     lansoprazole 30 MG capsule  Commonly known as:  PREVACID      TAKE these medications        gabapentin 400 MG capsule  Commonly known as:  NEURONTIN  Take 2 capsules (800 mg total) by mouth every 12 (twelve) hours.     lisinopril 10 MG tablet  Commonly known as:  PRINIVIL,ZESTRIL  Take 2 tablets (20 mg total) by mouth daily.     loperamide 1 MG/5ML solution  Commonly known as:  IMODIUM  Take 2 mg by mouth as needed for diarrhea or loose stools.     metFORMIN 1000 MG tablet  Commonly known as:  GLUCOPHAGE  Take 1 tablet (1,000 mg total) by mouth daily with breakfast.     pantoprazole 40 MG tablet  Commonly  known as:  PROTONIX  Take 1 tablet (40 mg total) by mouth daily at 6 (six) AM.       Discharge Instructions: Please refer to Patient Instructions section of EMR for full details.  Patient was counseled important signs and symptoms that should prompt return to medical care, changes in medications, dietary instructions, activity restrictions, and follow up appointments.   Follow-Up Appointments:     Follow-up Information    Follow up with [facility].   Why:  Will be contacted concerning follow-up     Lorna Few, DO 04/28/2014, 3:13 PM PGY-1, [place]

## 2014-04-28 NOTE — Progress Notes (Signed)
Family Medicine Teaching Service Daily Progress Note Intern Pager: [phone]  Patient name: Eric Morton Medical record number: [id number] Date of birth: August 30, 1965 Age: 49 y.o. Gender: male  Primary Care Provider: Default, Provider, MD Consultants: GI Code Status: Full  Assessment and Plan: 49 y.o. male presenting with vomiting and bloody diarrhea . PMH is significant for HTN, DMII with neuropathy, CKD, polycythema vera, clinically diagnosed gastric ulcers, ETOH abuse with liver disease, Cocaine Abuse, Tobacco Abuse.   #Bloody Diarrhea: persistent diarrhea for > 2 months. Cocaine abuse, increasing risk of ischemic bowel.  - NPO due to colonoscopy today - FOBT positive - Hemoglobin 16.2 at admission, 14.8 on 3/31, today's value pending - Mg, Phos levels, Lactate and LDH >> all wnl - Alk Phos: 145 >> 138 (3/30)  - Stool culture >> pending (neg to date) - C.Diff pcr- neg - CT- sigmoid colitis, diverticulosis - Cipro / Flagyl (3/29-3/31) - Morphine for pain prn.  - Protonix 71m QD PO - NS '@112'  - GI consulted, appreciate recommendations. Suspect IBD as etiology.  - Colonoscopy today  #Vomiting: two times prior to admission. Reports bloody vomitus. Epigastric pain. Hx Alcoholism w/ recent decrease due to abdominal pain.  - NPO due to colonoscopy - Zofran prn for nausea.   #Polysubstance Abuse: known ETOH and Cocaine abuse.  - UDS- positive for cocaine and THC - ETOH lvl < 5 - HIV neg, HCV neg, HBV core neg, HBV surface pending - CIWA for ETOH withdrawal: 0 > 1 (anxiety) > 0 > 1 (anxiety) > 0 - Does not want nicotine patch at this time.   #Hypoalbuminemia: albumin 2.4 on admission. Suspected to be secondary to GI loss. - Albumin 2.1 today - Nutritional supplementation  - Will monitor.   #HTN - BP 151 -174 / 80-101 this am  - Continue home lisinopril--Increasing dose from 218mto 4019m Hydralazine PRN  - monitor  # Hypokalemia- Potassium 4 at admission - Potassium  3.3  - Replete following colonoscopy with Kdur 93m57m  FEN/GI: NPO d/t Colonoscopy Prophylaxis: SCD's given GI bleeding.   Disposition: Consider discharge pending Colonoscopy  Subjective:  Feeling well today, with resolving abdominal pain. States he has had blood in some of his bowel movements but not all of them and the diarrhea has improved following his bowel regimen. Feels frustrated that he is NPO.   Objective: Temp:  [97.7 F (36.5 C)-98.2 F (36.8 C)] 98.2 F (36.8 C) (04/01 1121) Pulse Rate:  [69-89] 89 (04/01 1121) Resp:  [15-18] 15 (04/01 1121) BP: (149-191)/(80-103) 191/103 mmHg (04/01 1121) SpO2:  [99 %-100 %] 99 % (04/01 1121) Physical Exam: General: [id number], resting comfortably in on apparent distress Cardiovascular: RRR, No murmur appreciated.  Respiratory: CTAB, good expansion  Abdomen: Soft, TTP epigastric, no rebound, No masses, normal bowel sounds Extremities: no edema Skin: No rashes, no lesions.  Neuro: No focal deficits.   Laboratory:  Recent Labs Lab 04/26/14 0510 04/27/14 1020 04/28/14 0848  WBC 6.5 7.0 4.6  HGB 16.2 14.8 14.5  HCT 48.9 42.5 42.6  PLT 225 232 206    Recent Labs Lab 04/25/14 1347 04/26/14 0510 04/27/14 1020 04/28/14 0848  NA 134* 140 139 138  K 3.7 4.0 3.3* 3.3*  CL 101 109 111 111  CO2 '26 26 21 21  ' BUN 6 5* <5* <5*  CREATININE 0.93 0.92 0.92 0.89  CALCIUM 8.4 8.4 8.0* 8.1*  PROT 6.2 5.5*  --  5.3*  BILITOT 0.7 1.0  --  0.6  ALKPHOS 145* 138*  --  107  ALT 40 42  --  30  AST 33 35  --  30  GLUCOSE 99 88 92 74   PCR neg Lactic acid 0.7 LDH 210 Mg 1.6 Phos  Phos 2.7 Stool culture pending (neg to date) Hepatitis: see above  Imaging/Diagnostic Tests: CT Abd/pelvis 3/29 IMPRESSION: Diffuse wall thickening throughout the sigmoid colon most compatible with infectious or inflammatory colitis. Scattered distal descending colonic and sigmoid diverticulosis. Normal appendix.   [place],  [place] 04/28/2014, 12:42 PM PGY-1, [facility] Intern pager: [phone], text pages welcome

## 2014-04-30 LAB — STOOL CULTURE: Special Requests: NORMAL

## 2014-04-30 NOTE — Anesthesia Postprocedure Evaluation (Signed)
  Anesthesia Post-op Note  Patient: Eric Morton  Procedure(s) Performed: Procedure(s) (LRB): COLONOSCOPY WITH PROPOFOL (N/A)  Patient Location: PACU  Anesthesia Type: MAC  Level of Consciousness: awake and alert   Airway and Oxygen Therapy: Patient Spontanous Breathing  Post-op Pain: mild  Post-op Assessment: Post-op Vital signs reviewed, Patient's Cardiovascular Status Stable, Respiratory Function Stable, Patent Airway and No signs of Nausea or vomiting  Last Vitals:  Filed Vitals:   04/28/14 1650  BP: 145/90  Pulse: 89  Temp: 36.4 C  Resp: 16    Post-op Vital Signs: stable   Complications: No apparent anesthesia complications

## 2014-05-01 ENCOUNTER — Encounter (HOSPITAL_COMMUNITY): Payer: Self-pay | Admitting: Internal Medicine

## 2014-05-03 ENCOUNTER — Encounter: Payer: Self-pay | Admitting: Internal Medicine

## 2014-05-03 NOTE — Progress Notes (Signed)
Quick Note:  6 polyps largest 2.5 - 3 cm TV adenomas and tub adenomas the largest had a focus of HGD and one 15 mm had adenoma to cauterized margin - repeat colonoscopy approx 1 year 04/2015 ______

## 2014-05-04 ENCOUNTER — Encounter: Payer: Self-pay | Admitting: Internal Medicine

## 2014-05-04 ENCOUNTER — Ambulatory Visit: Payer: Self-pay | Attending: Internal Medicine | Admitting: Internal Medicine

## 2014-05-04 ENCOUNTER — Other Ambulatory Visit: Payer: Self-pay | Admitting: Internal Medicine

## 2014-05-04 VITALS — BP 131/81 | HR 85 | Temp 98.0°F | Resp 16 | Ht 67.0 in | Wt 160.0 lb

## 2014-05-04 DIAGNOSIS — N63 Unspecified lump in unspecified breast: Secondary | ICD-10-CM

## 2014-05-04 DIAGNOSIS — E119 Type 2 diabetes mellitus without complications: Secondary | ICD-10-CM | POA: Insufficient documentation

## 2014-05-04 DIAGNOSIS — I10 Essential (primary) hypertension: Secondary | ICD-10-CM | POA: Insufficient documentation

## 2014-05-04 DIAGNOSIS — E1142 Type 2 diabetes mellitus with diabetic polyneuropathy: Secondary | ICD-10-CM | POA: Insufficient documentation

## 2014-05-04 DIAGNOSIS — K529 Noninfective gastroenteritis and colitis, unspecified: Secondary | ICD-10-CM | POA: Insufficient documentation

## 2014-05-04 LAB — GLUCOSE, POCT (MANUAL RESULT ENTRY): POC Glucose: 112 mg/dl — AB (ref 70–99)

## 2014-05-04 LAB — BASIC METABOLIC PANEL
BUN: 6 mg/dL (ref 6–23)
CO2: 30 mEq/L (ref 19–32)
Calcium: 8.6 mg/dL (ref 8.4–10.5)
Chloride: 102 mEq/L (ref 96–112)
Creat: 0.86 mg/dL (ref 0.50–1.35)
Glucose, Bld: 92 mg/dL (ref 70–99)
Potassium: 4.3 mEq/L (ref 3.5–5.3)
Sodium: 140 mEq/L (ref 135–145)

## 2014-05-04 LAB — LIPID PANEL
CHOL/HDL RATIO: 3.2 ratio
Cholesterol: 157 mg/dL (ref 0–200)
HDL: 49 mg/dL (ref >=40)
LDL CALC: 88 mg/dL (ref 0–99)
TRIGLYCERIDES: 99 mg/dL (ref <150)
VLDL: 20 mg/dL (ref 0–40)

## 2014-05-04 MED ORDER — METFORMIN HCL 1000 MG PO TABS: 1000.0000 mg | ORAL_TABLET | Freq: Every day | ORAL | Status: DC

## 2014-05-04 MED ORDER — LISINOPRIL 10 MG PO TABS: 20.0000 mg | ORAL_TABLET | Freq: Every day | ORAL | Status: DC

## 2014-05-04 MED ORDER — GABAPENTIN 400 MG PO CAPS: 800.0000 mg | ORAL_CAPSULE | Freq: Two times a day (BID) | ORAL | Status: DC

## 2014-05-04 NOTE — Progress Notes (Signed)
Pt here to establish care. Pt has a history of diabetes, HTN and hyperlipidemia. Pt left nipple is swollen and red and sometimes puss comes out.

## 2014-05-04 NOTE — Progress Notes (Signed)
Patient ID: Eric Morton, male   DOB: 03/24/1965, 49 y.o.   MRN: [id number]  [id number]  [id number]  DOB - 01-20-1966  CC:  Chief Complaint  Patient presents with  . Follow-up       HPI: Eric Morton is a 49 y.o. male here today to establish medical care.  Patient has past medical history of HTN, T2DM, and polycythemia vera. He reports that in 2014 he went for a left breast mass removal. He reports that he was told by his past PCP that Lipitor caused him to develop a breast mass.  He states that since the breast mass removal he has occassional swelling and pain of that same left breast that usually happens every two months. He reports that since Monday he has had a mass to grow above his nipple area that is very painful and having a yellow/bloody discharge from the nipple. No diarrha, nausea, or abdominal pain   No Known Allergies Past Medical History  Diagnosis Date  . Hypertension   . Substance abuse   . Peripheral neuropathy   . Hypercholesterolemia   . Type II diabetes mellitus   . Alcohol liver damage     "just a little damage"  . Chronic kidney disease     "jsut a little damage"  . Anxiety   . History of stomach ulcers     not confirmed, never underwent diagnostic endo or radiology, this manifested as epigastric distress.   . Polycythemia vera 2006  . Hx of adenomatous colonic polyps    Current Outpatient Prescriptions on File Prior to Visit  Medication Sig Dispense Refill  . gabapentin (NEURONTIN) 400 MG capsule Take 2 capsules (800 mg total) by mouth every 12 (twelve) hours. 60 capsule 0  . lisinopril (PRINIVIL,ZESTRIL) 10 MG tablet Take 2 tablets (20 mg total) by mouth daily. 30 tablet 0  . loperamide (IMODIUM) 1 MG/5ML solution Take 2 mg by mouth as needed for diarrhea or loose stools.    . metFORMIN (GLUCOPHAGE) 1000 MG tablet Take 1 tablet (1,000 mg total) by mouth daily with breakfast. 30 tablet 0  . pantoprazole (PROTONIX) 40 MG tablet Take 1 tablet (40 mg  total) by mouth daily at 6 (six) AM. 30 tablet 0   No current facility-administered medications on file prior to visit.   Family History  Problem Relation Age of Onset  . Diabetes Mother   . Hypertension Mother    History   Social History  . Marital Status: Widowed    Spouse Name: N/A  . Number of Children: N/A  . Years of Education: N/A   Occupational History  . Not on file.   Social History Main Topics  . Smoking status: Current Some Day Smoker -- 0.50 packs/day for 32 years    Types: Cigarettes  . Smokeless tobacco: Never Used  . Alcohol Use: 42.0 oz/week    70 Cans of beer per week     Comment: 3/14 quit drinking 5 days ago and then drank last nite  . Drug Use: Yes    Special: Marijuana     Comment: snorts and smokes cocaine; 09/25/2011 last time  . Sexual Activity: Yes   Other Topics Concern  . Not on file   Social History Narrative    Review of Systems  Genitourinary: Negative for frequency.  Neurological: Positive for tingling (in BLE).  Endo/Heme/Allergies: Positive for polydipsia.  All other systems reviewed and are negative.    Objective:   Filed Vitals:  05/04/14 1118  BP: 131/81  Pulse: 85  Temp: 98 F (36.7 C)  Resp: 16    Physical Exam  Constitutional: He is oriented to person, place, and time.  Cardiovascular: Normal rate, regular rhythm and normal heart sounds.   Pulmonary/Chest: Effort normal and breath sounds normal. Right breast exhibits no inverted nipple. Left breast exhibits inverted nipple, mass and tenderness (severely tenderness ).    Abdominal: Soft. Bowel sounds are normal.  Neurological: He is alert and oriented to person, place, and time.  Skin: Skin is warm and dry.     Lab Results  Component Value Date   WBC 4.6 04/28/2014   HGB 14.5 04/28/2014   HCT 42.6 04/28/2014   MCV 85.9 04/28/2014   PLT 206 04/28/2014   Lab Results  Component Value Date   CREATININE 0.89 04/28/2014   BUN <5* 04/28/2014   NA 138  04/28/2014   K 3.3* 04/28/2014   CL 111 04/28/2014   CO2 21 04/28/2014    Lab Results  Component Value Date   HGBA1C 6.50 04/13/2014   Lipid Panel     Component Value Date/Time   CHOL 199 04/15/2007 2039   TRIG 183* 04/15/2007 2039   HDL 44 04/15/2007 2039   CHOLHDL 4.5 Ratio 04/15/2007 2039   VLDL 37 04/15/2007 2039   LDLCALC 118* 04/15/2007 2039       Assessment and plan:   Eric Morton was seen today for follow-up.  Diagnoses and all orders for this visit:  Type 2 diabetes mellitus without complication Orders: -     Glucose (CBG) -     Microalbumin, urine -     metFORMIN (GLUCOPHAGE) 1000 MG tablet; Take 1 tablet (1,000 mg total) by mouth daily with breakfast. Patients diabetes is well control as evidence by a1c=6.5%.  Patient will continue with current therapy and continue to make necessary lifestyle changes.  Reviewed foot care, diet, exercise, annual health maintenance with patient.   Essential hypertension Orders: -     Lipid panel -     Basic Metabolic Panel -     lisinopril (PRINIVIL,ZESTRIL) 10 MG tablet; Take 2 tablets (20 mg total) by mouth daily. Patient blood pressure is stable and may continue on current medication.  Education on diet, exercise, and modifiable risk factors discussed. Will obtain appropriate labs as needed. Will follow up in 3-6 months.   Colitis Orders: -     Ambulatory referral to Gastroenterology Continue to monitor symptoms. Will send to GI per discharge note  Breast mass in male Orders: -     MM DIAG BREAST TOMO BILATERAL; Future I will send patient for additional testing due to symptoms. May need repeat surgical removal. Area is very TTP.  Diabetic polyneuropathy associated with type 2 diabetes mellitus Orders: -    Refill gabapentin (NEURONTIN) 400 MG capsule; Take 2 capsules (800 mg total) by mouth every 12 (twelve) hours.    Return in about 2 weeks (around 05/18/2014) for Lab Visit and 3 mo PCP.     Chari Manning,  NP-C [facility] [phone] 05/04/2014, 11:33 AM

## 2014-05-04 NOTE — Patient Instructions (Addendum)
  Breast Scan Breast scan is procedure done to examine dense breast tissue, which is difficult in a normal mammogram. It is used in women with breast lesions from fibrocystic disease, fibroadenoma, and fat necrosis. It also is used to determine the course of treatment for breast cancer. LET [facility] CARE PROVIDER KNOW ABOUT:  Any allergies you have.  All medicines you are taking, including vitamins, herbs, eye drops, creams, and over-the-counter medicines.  Previous problems you or members of your family have had with the use of anesthetics.  Any blood disorders you have.  Previous surgeries you have had.  Medical conditions you have.  Pregnancy or the possibility that you may be pregnant. RISKS AND COMPLICATIONS Generally, this is a safe procedure. However, as with any procedure, complications can occur. Possible complications include:   Slight discomfort from injection of radioactive substance.  Allergic reaction to contrast or radioactive substance used in exam. BEFORE THE PROCEDURE No fasting or sedation is required. PROCEDURE   You will be asked to remove all jewelry and clothing from the waist up.  An IV tube will be inserted in your arm or hand opposite the side of the breast to be examined. If both breasts are being evaluated, the IV tube may be inserted into a vein in the foot.  You will be positioned face down on a table. The breast to be imaged will be placed through an opening in the table.  The radioactive agent will be injected into the IV tube. You may experience a slight metallic taste after the injection.  Imaging will begin a few minutes after the injection. A scanner will be placed over the breast and will record the radiation given off.  You may also be asked to get into different positions during the scan.  When the scan is complete, the IV tube is removed. AFTER THE PROCEDURE  You will be asked to get up slowly from the scanner to avoid  light-headedness from lying flat during the procedure.  Drink plenty of fluids to help flush the remaining radioactive agent from your body. Document Released: 02/08/2004 Document Revised: 01/18/2013 Document Reviewed: 09/20/2012 [facility] Patient Information 2015 [place], [place]. This information is not intended to replace advice given to you by your health care provider. Make sure you discuss any questions you have with your health care provider.

## 2014-05-05 LAB — MICROALBUMIN, URINE: Microalb, Ur: 15.2 mg/dL — ABNORMAL HIGH (ref <2.0)

## 2014-05-08 ENCOUNTER — Telehealth: Payer: Self-pay | Admitting: *Deleted

## 2014-05-08 NOTE — Telephone Encounter (Signed)
-----   Message from Lance Bosch, NP sent at 05/05/2014 10:03 PM EDT ----- Labs look good.

## 2014-05-08 NOTE — Telephone Encounter (Signed)
Pt is aware of his lab results. 

## 2014-05-09 ENCOUNTER — Ambulatory Visit
Admission: RE | Admit: 2014-05-09 | Discharge: 2014-05-09 | Disposition: A | Discharge status: Home / Self Care | Payer: No Typology Code available for payment source | Source: Ambulatory Visit | Attending: Internal Medicine | Admitting: Internal Medicine

## 2014-05-09 DIAGNOSIS — N63 Unspecified lump in unspecified breast: Secondary | ICD-10-CM

## 2014-05-18 ENCOUNTER — Other Ambulatory Visit: Payer: Self-pay

## 2014-05-22 ENCOUNTER — Telehealth: Payer: Self-pay | Admitting: *Deleted

## 2014-05-22 NOTE — Telephone Encounter (Signed)
-----   Message from Lance Bosch, NP sent at 05/18/2014  8:06 PM EDT ----- Negative mammogram

## 2014-05-22 NOTE — Telephone Encounter (Signed)
Left VM to return my call

## 2014-07-25 ENCOUNTER — Telehealth: Payer: Self-pay | Admitting: Internal Medicine

## 2014-07-25 NOTE — Telephone Encounter (Signed)
An employee from gastroenterology called stating that this pt no showed to his appt today.

## 2015-03-20 ENCOUNTER — Encounter (HOSPITAL_COMMUNITY): Payer: Self-pay | Admitting: Emergency Medicine

## 2015-03-20 ENCOUNTER — Emergency Department (HOSPITAL_COMMUNITY): Payer: Self-pay

## 2015-03-20 ENCOUNTER — Inpatient Hospital Stay (HOSPITAL_COMMUNITY)
Admission: EM | Admit: 2015-03-20 | Discharge: 2015-03-23 | DRG: 066 | Disposition: A | Discharge status: Home / Self Care | Payer: Self-pay | Attending: Family Medicine | Admitting: Family Medicine

## 2015-03-20 DIAGNOSIS — E119 Type 2 diabetes mellitus without complications: Secondary | ICD-10-CM

## 2015-03-20 DIAGNOSIS — I6350 Cerebral infarction due to unspecified occlusion or stenosis of unspecified cerebral artery: Secondary | ICD-10-CM

## 2015-03-20 DIAGNOSIS — E1142 Type 2 diabetes mellitus with diabetic polyneuropathy: Secondary | ICD-10-CM

## 2015-03-20 DIAGNOSIS — K709 Alcoholic liver disease, unspecified: Secondary | ICD-10-CM | POA: Diagnosis present

## 2015-03-20 DIAGNOSIS — D45 Polycythemia vera: Secondary | ICD-10-CM | POA: Diagnosis present

## 2015-03-20 DIAGNOSIS — F1010 Alcohol abuse, uncomplicated: Secondary | ICD-10-CM | POA: Diagnosis present

## 2015-03-20 DIAGNOSIS — E785 Hyperlipidemia, unspecified: Secondary | ICD-10-CM | POA: Diagnosis present

## 2015-03-20 DIAGNOSIS — Z8673 Personal history of transient ischemic attack (TIA), and cerebral infarction without residual deficits: Secondary | ICD-10-CM | POA: Insufficient documentation

## 2015-03-20 DIAGNOSIS — R471 Dysarthria and anarthria: Secondary | ICD-10-CM | POA: Diagnosis present

## 2015-03-20 DIAGNOSIS — D696 Thrombocytopenia, unspecified: Secondary | ICD-10-CM

## 2015-03-20 DIAGNOSIS — R29810 Facial weakness: Secondary | ICD-10-CM | POA: Diagnosis present

## 2015-03-20 DIAGNOSIS — I10 Essential (primary) hypertension: Secondary | ICD-10-CM | POA: Diagnosis present

## 2015-03-20 DIAGNOSIS — R29704 NIHSS score 4: Secondary | ICD-10-CM | POA: Diagnosis present

## 2015-03-20 DIAGNOSIS — F17210 Nicotine dependence, cigarettes, uncomplicated: Secondary | ICD-10-CM | POA: Diagnosis present

## 2015-03-20 DIAGNOSIS — I639 Cerebral infarction, unspecified: Principal | ICD-10-CM | POA: Diagnosis present

## 2015-03-20 DIAGNOSIS — F1410 Cocaine abuse, uncomplicated: Secondary | ICD-10-CM | POA: Diagnosis present

## 2015-03-20 DIAGNOSIS — Z720 Tobacco use: Secondary | ICD-10-CM | POA: Diagnosis present

## 2015-03-20 LAB — CBC
HEMATOCRIT: 61 % — AB (ref 39.0–52.0)
Hemoglobin: 20.6 g/dL — ABNORMAL HIGH (ref 13.0–17.0)
MCH: 31 pg (ref 26.0–34.0)
MCHC: 33.8 g/dL (ref 30.0–36.0)
MCV: 91.7 fL (ref 78.0–100.0)
PLATELETS: 98 10*3/uL — AB (ref 150–400)
RBC: 6.65 MIL/uL — ABNORMAL HIGH (ref 4.22–5.81)
RDW: 12.8 % (ref 11.5–15.5)
WBC: 4.3 10*3/uL (ref 4.0–10.5)

## 2015-03-20 LAB — COMPREHENSIVE METABOLIC PANEL
ALBUMIN: 2.9 g/dL — AB (ref 3.5–5.0)
ALT: 19 U/L (ref 17–63)
ANION GAP: 10 (ref 5–15)
AST: 26 U/L (ref 15–41)
Alkaline Phosphatase: 88 U/L (ref 38–126)
BUN: 6 mg/dL (ref 6–20)
CO2: 24 mmol/L (ref 22–32)
CREATININE: 1.04 mg/dL (ref 0.61–1.24)
Calcium: 9 mg/dL (ref 8.9–10.3)
Chloride: 101 mmol/L (ref 101–111)
GFR calc Af Amer: >60 mL/min (ref >60)
Glucose, Bld: 188 mg/dL — ABNORMAL HIGH (ref 65–99)
Potassium: 4.6 mmol/L (ref 3.5–5.1)
Sodium: 135 mmol/L (ref 135–145)
Total Bilirubin: 0.9 mg/dL (ref 0.3–1.2)
Total Protein: 6.3 g/dL — ABNORMAL LOW (ref 6.5–8.1)

## 2015-03-20 LAB — CBG MONITORING, ED: Glucose-Capillary: 182 mg/dL — ABNORMAL HIGH (ref 65–99)

## 2015-03-20 LAB — DIFFERENTIAL
BASOS PCT: 1 %
Basophils Absolute: 0 10*3/uL (ref 0.0–0.1)
EOS ABS: 0.4 10*3/uL (ref 0.0–0.7)
EOS PCT: 9 %
Lymphocytes Relative: 48 %
Lymphs Abs: 2.1 10*3/uL (ref 0.7–4.0)
Monocytes Absolute: 0.5 10*3/uL (ref 0.1–1.0)
Monocytes Relative: 12 %
NEUTROS PCT: 30 %
Neutro Abs: 1.3 10*3/uL — ABNORMAL LOW (ref 1.7–7.7)

## 2015-03-20 LAB — APTT: aPTT: 31 seconds (ref 24–37)

## 2015-03-20 LAB — PROTIME-INR
INR: 0.96 (ref 0.00–1.49)
PROTHROMBIN TIME: 13 s (ref 11.6–15.2)

## 2015-03-20 LAB — I-STAT TROPONIN, ED: Troponin i, poc: 0 ng/mL (ref 0.00–0.08)

## 2015-03-20 MED ORDER — LORAZEPAM 2 MG/ML IJ SOLN
1.0000 mg | Freq: Once | INTRAMUSCULAR | Status: AC
  Administered 2015-03-20: 1 mg via INTRAVENOUS
  Filled 2015-03-20: qty 1

## 2015-03-20 NOTE — ED Notes (Signed)
Pt with left sided facial droop and slurred speech starting at 1500 yesterday

## 2015-03-20 NOTE — ED Notes (Signed)
Patient transported to MRI 

## 2015-03-20 NOTE — ED Provider Notes (Signed)
CSN: [id number]     Arrival date & time 03/20/15  1519 History  By signing my name below, I, Evelene Croon, attest that this documentation has been prepared under the direction and in the presence of Delora Fuel, MD . Electronically Signed: Evelene Croon, Scribe. 03/21/2015. 12:25 AM.     Chief Complaint  Patient presents with  . Stroke Symptoms    The history is provided by the patient. No language interpreter was used.     HPI Comments:  Eric Morton is a 50 y.o. male with a history of polycythemia vera, HTN, DM, and peripheral neuropathy, who presents to the Emergency Department complaining of difficulty speaking/slurred speech, which has been constant since onset ~ 1300 yesterday. He reports associated numbness to his left lip and left hand yesterday; states the numbness has resolved.  He denies unilateral weakness, HA, nausea, and CP. No alleviating factors noted.  Past Medical History  Diagnosis Date  . Hypertension   . Substance abuse   . Peripheral neuropathy ([place])   . Hypercholesterolemia   . Type II diabetes mellitus ([place])   . 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 Chronic kidney disease     "jsut a little damage"  . Anxiety   . History of stomach ulcers     not confirmed, never underwent diagnostic endo or radiology, this manifested as epigastric distress.   . Polycythemia vera ([place]) 2006  . Hx of adenomatous colonic polyps    Past Surgical History  Procedure Laterality Date  . Irrigation and debridement abscess  09/26/2011    Procedure: IRRIGATION AND DEBRIDEMENT ABSCESS;  Surgeon: Imogene Burn. Georgette Dover, MD;  Location: [place];  Service: General;  Laterality: Left;  Incision and Drainage left breast abscess  . Colonoscopy with propofol N/A 04/28/2014    Procedure: COLONOSCOPY WITH PROPOFOL;  Surgeon: Gatha Mayer, MD;  Location: [place];  Service: Endoscopy;  Laterality: N/A;   Family History  Problem Relation Age of Onset  . Diabetes Mother   .  Hypertension Mother    Social History  Substance Use Topics  . Smoking status: Current Some Day Smoker -- 0.50 packs/day for 32 years    Types: Cigarettes  . Smokeless tobacco: Never Used  . Alcohol Use: 42.0 oz/week    70 Cans of beer per week     Comment: 3/14 quit drinking 5 days ago and then drank last nite    Review of Systems  Cardiovascular: Negative for chest pain.  Gastrointestinal: Negative for nausea.  Neurological: Positive for speech difficulty and numbness. Negative for weakness and headaches.  All other systems reviewed and are negative.   Allergies  Review of patient's allergies indicates no known allergies.  Home Medications   Prior to Admission medications   Medication Sig Start Date End Date Taking? Authorizing Provider  gabapentin (NEURONTIN) 400 MG capsule Take 2 capsules (800 mg total) by mouth every 12 (twelve) hours. 05/04/14   Lance Bosch, NP  lisinopril (PRINIVIL,ZESTRIL) 10 MG tablet Take 2 tablets (20 mg total) by mouth daily. 05/04/14   Lance Bosch, NP  loperamide (IMODIUM) 1 MG/5ML solution Take 2 mg by mouth as needed for diarrhea or loose stools.    Historical Provider, MD  metFORMIN (GLUCOPHAGE) 1000 MG tablet Take 1 tablet (1,000 mg total) by mouth daily with breakfast. 05/04/14   Lance Bosch, NP  pantoprazole (PROTONIX) 40 MG tablet Take 1 tablet (40 mg total) by mouth  daily at 6 (six) AM. 04/28/14   Olanta N Rumley, DO   BP 174/98 mmHg  Pulse 81  Temp(Src) 97.9 F (36.6 C) (Oral)  Resp 17  SpO2 98% Physical Exam  Constitutional: He is oriented to person, place, and time. He appears well-developed and well-nourished. No distress.  HENT:  Head: Normocephalic and atraumatic.  Eyes: Conjunctivae and EOM are normal. Pupils are equal, round, and reactive to light.  Neck: Normal range of motion. Neck supple. No JVD present.  Cardiovascular: Normal rate, regular rhythm and normal heart sounds.   No murmur heard. Pulmonary/Chest: Effort  normal and breath sounds normal. He has no wheezes. He has no rales. He exhibits no tenderness.  Abdominal: Soft. Bowel sounds are normal. He exhibits no distension and no mass. There is no tenderness.  Musculoskeletal: Normal range of motion. He exhibits no edema.  Lymphadenopathy:    He has no cervical adenopathy.  Neurological: He is alert and oriented to person, place, and time. No cranial nerve deficit. He exhibits normal muscle tone. Coordination normal.  Speech dysarthric  Mild left central facial droop  Mild left pronator drift   Skin: Skin is warm and dry. No rash noted.  Psychiatric: He has a normal mood and affect.  Nursing note and vitals reviewed.   ED Course  Procedures   DIAGNOSTIC STUDIES:  Oxygen Saturation is 98% on RA, normal by my interpretation.    COORDINATION OF CARE:  11:05 PM Pt updated with partial results. Pt informed of possible hospital admission. Discussed treatment plan with pt at bedside and pt agreed to plan.  Labs Review Results for orders placed or performed during the hospital encounter of 03/20/15  Protime-INR  Result Value Ref Range   Prothrombin Time 13.0 11.6 - 15.2 seconds   INR 0.96 0.00 - 1.49  APTT  Result Value Ref Range   aPTT 31 24 - 37 seconds  CBC  Result Value Ref Range   WBC 4.3 4.0 - 10.5 K/uL   RBC 6.65 (H) 4.22 - 5.81 MIL/uL   Hemoglobin 20.6 (H) 13.0 - 17.0 g/dL   HCT 61.0 (H) 39.0 - 52.0 %   MCV 91.7 78.0 - 100.0 fL   MCH 31.0 26.0 - 34.0 pg   MCHC 33.8 30.0 - 36.0 g/dL   RDW 12.8 11.5 - 15.5 %   Platelets 98 (L) 150 - 400 K/uL  Differential  Result Value Ref Range   Neutrophils Relative % 30 %   Neutro Abs 1.3 (L) 1.7 - 7.7 K/uL   Lymphocytes Relative 48 %   Lymphs Abs 2.1 0.7 - 4.0 K/uL   Monocytes Relative 12 %   Monocytes Absolute 0.5 0.1 - 1.0 K/uL   Eosinophils Relative 9 %   Eosinophils Absolute 0.4 0.0 - 0.7 K/uL   Basophils Relative 1 %   Basophils Absolute 0.0 0.0 - 0.1 K/uL  Comprehensive  metabolic panel  Result Value Ref Range   Sodium 135 135 - 145 mmol/L   Potassium 4.6 3.5 - 5.1 mmol/L   Chloride 101 101 - 111 mmol/L   CO2 24 22 - 32 mmol/L   Glucose, Bld 188 (H) 65 - 99 mg/dL   BUN 6 6 - 20 mg/dL   Creatinine, Ser 1.04 0.61 - 1.24 mg/dL   Calcium 9.0 8.9 - 10.3 mg/dL   Total Protein 6.3 (L) 6.5 - 8.1 g/dL   Albumin 2.9 (L) 3.5 - 5.0 g/dL   AST 26 15 - 41 U/L  ALT 19 17 - 63 U/L   Alkaline Phosphatase 88 38 - 126 U/L   Total Bilirubin 0.9 0.3 - 1.2 mg/dL   GFR calc non Af Amer >60 >60 mL/min   GFR calc Af Amer >60 >60 mL/min   Anion gap 10 5 - 15  Lipid panel  Result Value Ref Range   Cholesterol 219 (H) 0 - 200 mg/dL   Triglycerides 175 (H) <150 mg/dL   HDL 64 >40 mg/dL   Total CHOL/HDL Ratio 3.4 RATIO   VLDL 35 0 - 40 mg/dL   LDL Cholesterol 120 (H) 0 - 99 mg/dL  I-stat troponin, ED (not at [facility], [facility])  Result Value Ref Range   Troponin i, poc 0.00 0.00 - 0.08 ng/mL   Comment 3          CBG monitoring, ED  Result Value Ref Range   Glucose-Capillary 182 (H) 65 - 99 mg/dL  CBG monitoring, ED  Result Value Ref Range   Glucose-Capillary 144 (H) 65 - 99 mg/dL   Comment 1 Notify RN    Comment 2 Document in Chart    Imaging Review Ct Head Wo Contrast  03/20/2015  CLINICAL DATA:  50 year old male with left facial droop since 1600 hours yesterday. Initial encounter. EXAM: CT HEAD WITHOUT CONTRAST TECHNIQUE: Contiguous axial images were obtained from the base of the skull through the vertex without intravenous contrast. COMPARISON:  None. FINDINGS: Chronic right lamina papyracea fracture with superimposed widespread paranasal sinus mucosal thickening and opacification, sparing the sphenoid sinuses. Tympanic cavities and mastoids are clear. No acute osseous abnormality identified. No acute orbit or scalp soft tissue findings. Calcified atherosclerosis at the skull base. Cerebral volume is normal. Multifocal right side deep white matter capsule hypodensity (series  2, image 19). Less pronounced left hemisphere patchy white matter hypodensity. Dystrophic basal ganglia calcifications. Chronic appearing small to medium size infarcts in the left cerebellum. Probable venous variant in the right ambient cisterns series 2, image 12. No asymmetric intracranial vascular hyperdensity identified. No cortically based acute infarct identified. No acute intracranial hemorrhage identified. No midline shift, mass effect, or evidence of intracranial mass lesion. No ventriculomegaly. IMPRESSION: 1. Age advanced chronic ischemic disease, including age indeterminate right deep white matter capsule involvement. 2. Widespread paranasal sinus inflammation. Electronically Signed   By: Genevie Ann M.D.   On: 03/20/2015 16:12   Mr Angiogram Head Wo Contrast  03/21/2015  CLINICAL DATA:  Initial evaluation for acute speech difficulty. EXAM: MRI HEAD WITHOUT CONTRAST MRA HEAD WITHOUT CONTRAST TECHNIQUE: Multiplanar, multiecho pulse sequences of the brain and surrounding structures were obtained without intravenous contrast. Angiographic images of the head were obtained using MRA technique without contrast. COMPARISON:  Prior CT from 03/20/2015. FINDINGS: MRI HEAD FINDINGS Diffuse prominence of the CSF containing spaces is compatible with generalized age-related cerebral atrophy. Patchy T2/FLAIR hyperintensity within the periventricular and deep white matter both cerebral hemispheres most consistent with chronic small vessel ischemic disease. This is moderate in nature. Few scattered remote lacunar infarcts within the bilateral corona radiata. Small remote lacunar infarcts within the bilateral thalami and basal ganglia. Remote left cerebellar infarct present. Curvilinear focus of restricted diffusion measuring approximately 2 cm present within the posterior right lentiform nucleus extending into the right corona radiata, consistent with acute ischemic nonhemorrhagic infarct. No associated mass effect or  hemorrhage. No other areas of infarction. Minimal patchy high signal on axial either overlie sequence within the anterior right corona radiata and left corpus callosum favored to be related  to susceptibility artifact from remote lacunar infarcts within these regions. Abnormal flow void within the distal left vertebral artery, which may related to slow flow and/or atheromatous disease. Major intracranial vascular flow voids are otherwise maintained. No acute intracranial hemorrhage. No areas of chronic hemorrhage. No mass lesion, midline shift, or mass effect. No hydrocephalus. No extra-axial fluid collection. Major dural sinuses are patent. Craniocervical junction within normal limits. No acute abnormality within the upper cervical spine. Pituitary gland within normal limits. No acute abnormality about the orbits. Mucosal thickening throughout the ethmoidal air cells and maxillary sinuses. Right frontal sinus is largely opacified. No significant mastoid effusion. Inner ear structures grossly normal. Bone marrow signal intensity within normal limits. No scalp soft tissue abnormality. MRA HEAD FINDINGS ANTERIOR CIRCULATION: Visualized distal cervical segments of the internal carotid arteries are patent with antegrade flow. Petrous segments patent. Multi focal atheromatous irregularity throughout the cavernous/ supraclinoid ICAs with secondary mild to moderate multi focal narrowing, slightly worse on the left. A1 segments patent. Anterior communicating artery within normal limits. Anterior cerebral arteries opacified to their distal aspects. M1 segments mildly irregular without high-grade stenosis or occlusion. MCA bifurcations within normal limits. No proximal M2 branch occlusion. Distal MCA branches opacified and fairly symmetric. Multifocal atheromatous irregularity within the distal MCA branches bilaterally. POSTERIOR CIRCULATION: Vertebral arteries patent to the vertebrobasilar junction. Right vertebral artery  dominant. Posterior inferior cerebral arteries not well evaluated on this exam. Basilar artery tortuous with mild smooth narrowing without focal high-grade stenosis. Dominant right anterior inferior cerebral artery. Probable high-grade stenosis at the origin of the left superior cerebral artery. Superior cerebral arteries otherwise well opacified. Both posterior cerebral arteries arise from the basilar artery and are well opacified to their distal aspects. Multifocal atheromatous irregularity with stenoses present within the PCAs, right slightly worse than left. No aneurysm or vascular malformation. IMPRESSION: MRI HEAD IMPRESSION: 1. Approximately 2 cm curvilinear acute ischemic infarct involving the posterior right lentiform nucleus/corona radiata. No associated hemorrhage or mass effect. 2. Generalized cerebral atrophy with moderate chronic small vessel ischemic disease and multiple remote infarcts as detailed above. 3. Inflammatory paranasal sinus disease as above. MRA HEAD IMPRESSION: 1. No large or proximal arterial branch occlusion within the intracranial circulation. 2. No high-grade or correctable stenosis within the intracranial circulation. 3. Multifocal atheromatous irregularity throughout the carotid siphons with mild to moderate multi focal narrowing. 4. Additional multi focal atheromatous irregularity throughout the intracranial circulation as detailed above, most evident within the distal MCA branches and PCAs bilaterally. Electronically Signed   By: Jeannine Boga M.D.   On: 03/21/2015 02:13   Mr Brain Wo Contrast  03/21/2015  CLINICAL DATA:  Initial evaluation for acute speech difficulty. EXAM: MRI HEAD WITHOUT CONTRAST MRA HEAD WITHOUT CONTRAST TECHNIQUE: Multiplanar, multiecho pulse sequences of the brain and surrounding structures were obtained without intravenous contrast. Angiographic images of the head were obtained using MRA technique without contrast. COMPARISON:  Prior CT from  03/20/2015. FINDINGS: MRI HEAD FINDINGS Diffuse prominence of the CSF containing spaces is compatible with generalized age-related cerebral atrophy. Patchy T2/FLAIR hyperintensity within the periventricular and deep white matter both cerebral hemispheres most consistent with chronic small vessel ischemic disease. This is moderate in nature. Few scattered remote lacunar infarcts within the bilateral corona radiata. Small remote lacunar infarcts within the bilateral thalami and basal ganglia. Remote left cerebellar infarct present. Curvilinear focus of restricted diffusion measuring approximately 2 cm present within the posterior right lentiform nucleus extending into the right corona radiata, consistent with acute  ischemic nonhemorrhagic infarct. No associated mass effect or hemorrhage. No other areas of infarction. Minimal patchy high signal on axial either overlie sequence within the anterior right corona radiata and left corpus callosum favored to be related to susceptibility artifact from remote lacunar infarcts within these regions. Abnormal flow void within the distal left vertebral artery, which may related to slow flow and/or atheromatous disease. Major intracranial vascular flow voids are otherwise maintained. No acute intracranial hemorrhage. No areas of chronic hemorrhage. No mass lesion, midline shift, or mass effect. No hydrocephalus. No extra-axial fluid collection. Major dural sinuses are patent. Craniocervical junction within normal limits. No acute abnormality within the upper cervical spine. Pituitary gland within normal limits. No acute abnormality about the orbits. Mucosal thickening throughout the ethmoidal air cells and maxillary sinuses. Right frontal sinus is largely opacified. No significant mastoid effusion. Inner ear structures grossly normal. Bone marrow signal intensity within normal limits. No scalp soft tissue abnormality. MRA HEAD FINDINGS ANTERIOR CIRCULATION: Visualized distal  cervical segments of the internal carotid arteries are patent with antegrade flow. Petrous segments patent. Multi focal atheromatous irregularity throughout the cavernous/ supraclinoid ICAs with secondary mild to moderate multi focal narrowing, slightly worse on the left. A1 segments patent. Anterior communicating artery within normal limits. Anterior cerebral arteries opacified to their distal aspects. M1 segments mildly irregular without high-grade stenosis or occlusion. MCA bifurcations within normal limits. No proximal M2 branch occlusion. Distal MCA branches opacified and fairly symmetric. Multifocal atheromatous irregularity within the distal MCA branches bilaterally. POSTERIOR CIRCULATION: Vertebral arteries patent to the vertebrobasilar junction. Right vertebral artery dominant. Posterior inferior cerebral arteries not well evaluated on this exam. Basilar artery tortuous with mild smooth narrowing without focal high-grade stenosis. Dominant right anterior inferior cerebral artery. Probable high-grade stenosis at the origin of the left superior cerebral artery. Superior cerebral arteries otherwise well opacified. Both posterior cerebral arteries arise from the basilar artery and are well opacified to their distal aspects. Multifocal atheromatous irregularity with stenoses present within the PCAs, right slightly worse than left. No aneurysm or vascular malformation. IMPRESSION: MRI HEAD IMPRESSION: 1. Approximately 2 cm curvilinear acute ischemic infarct involving the posterior right lentiform nucleus/corona radiata. No associated hemorrhage or mass effect. 2. Generalized cerebral atrophy with moderate chronic small vessel ischemic disease and multiple remote infarcts as detailed above. 3. Inflammatory paranasal sinus disease as above. MRA HEAD IMPRESSION: 1. No large or proximal arterial branch occlusion within the intracranial circulation. 2. No high-grade or correctable stenosis within the intracranial  circulation. 3. Multifocal atheromatous irregularity throughout the carotid siphons with mild to moderate multi focal narrowing. 4. Additional multi focal atheromatous irregularity throughout the intracranial circulation as detailed above, most evident within the distal MCA branches and PCAs bilaterally. Electronically Signed   By: Jeannine Boga M.D.   On: 03/21/2015 02:13   I have personally reviewed and evaluated these images and lab results as part of my medical decision-making.   MDM   Final diagnoses:  Cerebrovascular accident (CVA) due to occlusion of cerebral artery (HCC)  Polycythemia vera ([place])  Thrombocytopenia ([place])    Stroke with dysarthric speech and minimal left pronator drift and left facial droop which appears to be central rather than peripheral. Laboratory workup shows significant polycythemia which could account for his stroke from hyperviscosity. He is also noted to have thrombocytopenia worrisome for polycythemia vera. He is not a candidate for any stroke interventions and not a code stroke candidate because of the excessive time from onset of symptoms to presentation in  the ED. I discussed the polycythemia with the patient and he informs her cardiac she had been diagnosed with polycythemia years ago and had regular phlebotomy but had not gone for quite some time. However, the high hemoglobin is noted to be a significant change from one year ago. MRI has been ordered and case is discussed with Dr. Alcario Drought of [facility] who has requested to delay admission until MRI results are back. MRI confirms a right-sided stroke consistent with his observed neurologic deficits. Arrangements are made for hospital admission in neurologic consultation. Old records are reviewed and his oncology records are noted as visits only and in no notes are in EP[facility] regarding those visits .  I personally performed the services described in this documentation, which was scribed in my presence.  The recorded information has been reviewed and is accurate.      Delora Fuel, [place] [id number]

## 2015-03-20 NOTE — ED Notes (Signed)
Patient stated he is leaving

## 2015-03-20 NOTE — ED Notes (Signed)
Pt up to nurse first desk asking about long wait. Pts charted was noted to be dismissed. Pt states he never left. Registration placing pt back in lobby.

## 2015-03-21 ENCOUNTER — Emergency Department (HOSPITAL_COMMUNITY): Payer: Self-pay

## 2015-03-21 DIAGNOSIS — D45 Polycythemia vera: Secondary | ICD-10-CM

## 2015-03-21 DIAGNOSIS — E1159 Type 2 diabetes mellitus with other circulatory complications: Secondary | ICD-10-CM

## 2015-03-21 DIAGNOSIS — F1410 Cocaine abuse, uncomplicated: Secondary | ICD-10-CM

## 2015-03-21 DIAGNOSIS — F1010 Alcohol abuse, uncomplicated: Secondary | ICD-10-CM

## 2015-03-21 DIAGNOSIS — Z720 Tobacco use: Secondary | ICD-10-CM

## 2015-03-21 DIAGNOSIS — I10 Essential (primary) hypertension: Secondary | ICD-10-CM

## 2015-03-21 LAB — CBG MONITORING, ED
GLUCOSE-CAPILLARY: 144 mg/dL — AB (ref 65–99)
GLUCOSE-CAPILLARY: 139 mg/dL — AB (ref 65–99)
Glucose-Capillary: 153 mg/dL — ABNORMAL HIGH (ref 65–99)
Glucose-Capillary: 114 mg/dL — ABNORMAL HIGH (ref 65–99)

## 2015-03-21 LAB — LIPID PANEL
CHOL/HDL RATIO: 3.4 ratio
Cholesterol: 219 mg/dL — ABNORMAL HIGH (ref 0–200)
HDL: 64 mg/dL (ref >40)
LDL CALC: 120 mg/dL — AB (ref 0–99)
TRIGLYCERIDES: 175 mg/dL — AB (ref <150)
VLDL: 35 mg/dL (ref 0–40)

## 2015-03-21 LAB — GLUCOSE, CAPILLARY
Glucose-Capillary: 158 mg/dL — ABNORMAL HIGH (ref 65–99)
Glucose-Capillary: 307 mg/dL — ABNORMAL HIGH (ref 65–99)

## 2015-03-21 MED ORDER — LORAZEPAM 2 MG/ML IJ SOLN: 1.0000 mg | Freq: Four times a day (QID) | INTRAMUSCULAR | Status: DC | PRN

## 2015-03-21 MED ORDER — ADULT MULTIVITAMIN W/MINERALS CH
1.0000 | ORAL_TABLET | Freq: Every day | ORAL | Status: DC
  Administered 2015-03-22 – 2015-03-23 (×2): 1 via ORAL
  Filled 2015-03-21 (×2): qty 1

## 2015-03-21 MED ORDER — ENOXAPARIN SODIUM 40 MG/0.4ML ~~LOC~~ SOLN
40.0000 mg | Freq: Every day | SUBCUTANEOUS | Status: DC
  Administered 2015-03-21 – 2015-03-23 (×3): 40 mg via SUBCUTANEOUS
  Filled 2015-03-21 (×4): qty 0.4

## 2015-03-21 MED ORDER — VITAMIN B-1 100 MG PO TABS
100.0000 mg | ORAL_TABLET | Freq: Every day | ORAL | Status: DC
  Administered 2015-03-22 – 2015-03-23 (×2): 100 mg via ORAL
  Filled 2015-03-21 (×2): qty 1

## 2015-03-21 MED ORDER — FOLIC ACID 1 MG PO TABS
1.0000 mg | ORAL_TABLET | Freq: Every day | ORAL | Status: DC
  Administered 2015-03-22 – 2015-03-23 (×2): 1 mg via ORAL
  Filled 2015-03-21 (×2): qty 1

## 2015-03-21 MED ORDER — LORAZEPAM 1 MG PO TABS: 1.0000 mg | ORAL_TABLET | Freq: Four times a day (QID) | ORAL | Status: DC | PRN

## 2015-03-21 MED ORDER — THIAMINE HCL 100 MG/ML IJ SOLN
100.0000 mg | Freq: Every day | INTRAMUSCULAR | Status: DC
  Administered 2015-03-21: 100 mg via INTRAVENOUS
  Filled 2015-03-21: qty 2

## 2015-03-21 MED ORDER — ASPIRIN 300 MG RE SUPP
300.0000 mg | Freq: Every day | RECTAL | Status: DC
  Administered 2015-03-21 – 2015-03-22 (×2): 300 mg via RECTAL
  Filled 2015-03-21 (×2): qty 1

## 2015-03-21 MED ORDER — INSULIN ASPART 100 UNIT/ML ~~LOC~~ SOLN
0.0000 [IU] | SUBCUTANEOUS | Status: DC
  Administered 2015-03-21: 2 [IU] via SUBCUTANEOUS
  Administered 2015-03-21: 1 [IU] via SUBCUTANEOUS
  Administered 2015-03-21: 7 [IU] via SUBCUTANEOUS
  Administered 2015-03-21: 1 [IU] via SUBCUTANEOUS
  Administered 2015-03-22 (×2): 2 [IU] via SUBCUTANEOUS
  Administered 2015-03-22: 3 [IU] via SUBCUTANEOUS
  Administered 2015-03-22: 2 [IU] via SUBCUTANEOUS
  Administered 2015-03-22: 1 [IU] via SUBCUTANEOUS
  Administered 2015-03-22: 7 [IU] via SUBCUTANEOUS
  Administered 2015-03-23: 3 [IU] via SUBCUTANEOUS
  Administered 2015-03-23 (×2): 2 [IU] via SUBCUTANEOUS
  Filled 2015-03-21 (×3): qty 1

## 2015-03-21 MED ORDER — STROKE: EARLY STAGES OF RECOVERY BOOK
Freq: Once | Status: AC
  Administered 2015-03-21: 11:00:00
  Filled 2015-03-21: qty 1

## 2015-03-21 NOTE — H&P (Signed)
[facility] History and Physical  Eric Morton [id number] DOB: 04-Jun-1965 DOA: 03/20/2015  Referring physician: EDP PCP: Lance Bosch, NP   Chief Complaint: Stroke   HPI: Eric Morton is a 50 y.o. male with h/o polycythemia vera, HTN, DM, dosent take any of his meds or follow with oncology anymore, cocaine abuse, EtOH abuse, smoking.  Patient presents to the ED with c/o difficulty speaking, slurred speech, and ultimately difficulty swallowing which has been constant since onset at about 1pm yesterday afternoon.  He had numbness to left lip and hand yesterday which has resolved.  He initially ignored symptoms but decided to come in after he had difficulty swallowing.  Review of Systems: Systems reviewed.  As above, otherwise negative  Past Medical History  Diagnosis Date  . Hypertension   . Substance abuse   . Peripheral neuropathy ([place])   . Hypercholesterolemia   . Type II diabetes mellitus ([place])   . 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 Chronic kidney disease     "jsut a little damage"  . Anxiety   . History of stomach ulcers     not confirmed, never underwent diagnostic endo or radiology, this manifested as epigastric distress.   . Polycythemia vera ([place]) 2006  . Hx of adenomatous colonic polyps    Past Surgical History  Procedure Laterality Date  . Irrigation and debridement abscess  09/26/2011    Procedure: IRRIGATION AND DEBRIDEMENT ABSCESS;  Surgeon: Imogene Burn. Georgette Dover, MD;  Location: [facility];  Service: General;  Laterality: Left;  Incision and Drainage left breast abscess  . Colonoscopy with propofol N/A 04/28/2014    Procedure: COLONOSCOPY WITH PROPOFOL;  Surgeon: Gatha Mayer, MD;  Location: [facility];  Service: Endoscopy;  Laterality: N/A;   Social History:  reports that he has been smoking Cigarettes.  He has a 16 pack-year smoking history. He has never used smokeless tobacco. He reports that he drinks about 42.0 oz of alcohol per week.  He reports that he uses illicit drugs (Marijuana).  No Known Allergies  Family History  Problem Relation Age of Onset  . Diabetes Mother   . Hypertension Mother      Prior to Admission medications   Medication Sig Start Date End Date Taking? Authorizing Provider  gabapentin (NEURONTIN) 400 MG capsule Take 2 capsules (800 mg total) by mouth every 12 (twelve) hours. Patient not taking: Reported on 03/21/2015 05/04/14   Lance Bosch, NP  lisinopril (PRINIVIL,ZESTRIL) 10 MG tablet Take 2 tablets (20 mg total) by mouth daily. Patient not taking: Reported on 03/21/2015 05/04/14   Lance Bosch, NP  metFORMIN (GLUCOPHAGE) 1000 MG tablet Take 1 tablet (1,000 mg total) by mouth daily with breakfast. Patient not taking: Reported on 03/21/2015 05/04/14   Lance Bosch, NP  pantoprazole (PROTONIX) 40 MG tablet Take 1 tablet (40 mg total) by mouth daily at 6 (six) AM. Patient not taking: Reported on 03/21/2015 04/28/14   Lorna Few, DO   Physical Exam: Filed Vitals:   03/20/15 2300 03/20/15 2330  BP: 173/108 174/98  Pulse: 77 81  Temp:    Resp: 15 17    BP 174/98 mmHg  Pulse 81  Temp(Src) 97.9 F (36.6 C) (Oral)  Resp 17  SpO2 98%  General Appearance:    Alert, oriented, no distress, appears stated age  Head:    Normocephalic, atraumatic  Eyes:    PERRL, EOMI, sclera non-icteric  Nose:   Nares without drainage or epistaxis. Mucosa, turbinates normal  Throat:   Moist mucous membranes. Oropharynx without erythema or exudate.  Neck:   Supple. No carotid bruits.  No thyromegaly.  No lymphadenopathy.   Back:     No CVA tenderness, no spinal tenderness  Lungs:     Clear to auscultation bilaterally, without wheezes, rhonchi or rales  Chest wall:    No tenderness to palpitation  Heart:    Regular rate and rhythm without murmurs, gallops, rubs  Abdomen:     Soft, non-tender, nondistended, normal bowel sounds, no organomegaly  Genitalia:    deferred  Rectal:    deferred  Extremities:    No clubbing, cyanosis or edema.  Pulses:   2+ and symmetric all extremities  Skin:   Skin color, texture, turgor normal, no rashes or lesions  Lymph nodes:   Cervical, supraclavicular, and axillary nodes normal  Neurologic:   CNII-XII intact. Normal strength, sensation and reflexes      throughout    Labs on Admission:  Basic Metabolic Panel:  Recent Labs Lab 03/20/15 1536  NA 135  K 4.6  CL 101  CO2 24  GLUCOSE 188*  BUN 6  CREATININE 1.04  CALCIUM 9.0   Liver Function Tests:  Recent Labs Lab 03/20/15 1536  AST 26  ALT 19  ALKPHOS 88  BILITOT 0.9  PROT 6.3*  ALBUMIN 2.9*   No results for input(s): LIPASE, AMYLASE in the last 168 hours. No results for input(s): AMMONIA in the last 168 hours. CBC:  Recent Labs Lab 03/20/15 1536  WBC 4.3  NEUTROABS 1.3*  HGB 20.6*  HCT 61.0*  MCV 91.7  PLT 98*   Cardiac Enzymes: No results for input(s): CKTOTAL, CKMB, CKMBINDEX, TROPONINI in the last 168 hours.  BNP (last 3 results) No results for input(s): PROBNP in the last 8760 hours. CBG:  Recent Labs Lab 03/20/15 1536  GLUCAP 182*    Radiological Exams on Admission: Ct Head Wo Contrast  03/20/2015  CLINICAL DATA:  50 year old male with left facial droop since 1600 hours yesterday. Initial encounter. EXAM: CT HEAD WITHOUT CONTRAST TECHNIQUE: Contiguous axial images were obtained from the base of the skull through the vertex without intravenous contrast. COMPARISON:  None. FINDINGS: Chronic right lamina papyracea fracture with superimposed widespread paranasal sinus mucosal thickening and opacification, sparing the sphenoid sinuses. Tympanic cavities and mastoids are clear. No acute osseous abnormality identified. No acute orbit or scalp soft tissue findings. Calcified atherosclerosis at the skull base. Cerebral volume is normal. Multifocal right side deep white matter capsule hypodensity (series 2, image 19). Less pronounced left hemisphere patchy white matter  hypodensity. Dystrophic basal ganglia calcifications. Chronic appearing small to medium size infarcts in the left cerebellum. Probable venous variant in the right ambient cisterns series 2, image 12. No asymmetric intracranial vascular hyperdensity identified. No cortically based acute infarct identified. No acute intracranial hemorrhage identified. No midline shift, mass effect, or evidence of intracranial mass lesion. No ventriculomegaly. IMPRESSION: 1. Age advanced chronic ischemic disease, including age indeterminate right deep white matter capsule involvement. 2. Widespread paranasal sinus inflammation. Electronically Signed   By: Genevie Ann M.D.   On: 03/20/2015 16:12    EKG: Independently reviewed.  Assessment/Plan Principal Problem:   Stroke [facility]) Active Problems:   HTN (hypertension)   DM (diabetes mellitus) ([place])   ETOH abuse   Cocaine abuse   Tobacco abuse   Polycythemia vera ([place])   1. Stroke - 1. Spoke with  Dr. [name], neurology to see patient 2. NPO for now, SLP swallow eval in AM 3. ASA supp 4. Stroke pathway ordered 2. HTN - 1. Permissive HTN, not taking home meds anyhow 3. DM - SSI q4h sensitive scale 4. EtOH abuse - CIWA protocol 5. Polycythemia vera - likely needs therapeutic phlebotomy, not sure about doing this in setting of acute stroke however, consider oncology consult in AM 6. Tobacco and cocaine abuse - also ongoing, patient needs to quit.  Explained that these were likely major contributors to his stroke.    Code Status: Full  Family Communication: No family in room Disposition Plan: Admit to inpatient   Time spent: 100 min  Trinna Kunst M. [facility] Pager [phone]  If 7AM-7PM, please contact the day team taking care of the patient [email or web address] Password [email or web address] 03/21/2015, 2:04 AM

## 2015-03-21 NOTE — ED Notes (Signed)
CBG 139. 

## 2015-03-21 NOTE — Progress Notes (Signed)
Arrived from Ed. Alert and oriented. Denies any pain. Oriented to room with verbalized understanding. Will continue to monitor.

## 2015-03-21 NOTE — Consult Note (Signed)
Admission H&P    Chief Complaint: New onset facial weakness and slurred speech  HPI: Eric Morton is an 50 y.o. male history of hypertension, substance abuse including cocaine, diabetes mellitus, hyperlipidemia and polycythemia, presenting with left facial weakness and slurred speech since 3 PM on 03/19/2015. He said difficulty with swallowing as well, especially liquids. He's also noticed numbness involving left side of his mouth. MRI of his brain showed an acute right lenticular nucleus/corona radiata acute ischemic infarction. Multiple old involving basal ganglia also noted. MRA showed no large vessel occlusion or significant stenosis. NIH stroke score was 4.  LSN: 3:00 PM on 03/19/2015 tPA Given: No: On time window for treatment consideration mRankin:  Past Medical History  Diagnosis Date  . Hypertension   . Substance abuse   . Peripheral neuropathy ([place])   . Hypercholesterolemia   . Type II diabetes mellitus ([place])   . 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 Chronic kidney disease     "jsut a little damage"  . Anxiety   . History of stomach ulcers     not confirmed, never underwent diagnostic endo or radiology, this manifested as epigastric distress.   . Polycythemia vera ([place]) 2006  . Hx of adenomatous colonic polyps     Past Surgical History  Procedure Laterality Date  . Irrigation and debridement abscess  09/26/2011    Procedure: IRRIGATION AND DEBRIDEMENT ABSCESS;  Surgeon: Imogene Burn. Georgette Dover, MD;  Location: [facility];  Service: General;  Laterality: Left;  Incision and Drainage left breast abscess  . Colonoscopy with propofol N/A 04/28/2014    Procedure: COLONOSCOPY WITH PROPOFOL;  Surgeon: Gatha Mayer, MD;  Location: [facility];  Service: Endoscopy;  Laterality: N/A;    Family History  Problem Relation Age of Onset  . Diabetes Mother   . Hypertension Mother    Social History:  reports that he has been smoking Cigarettes.  He has a 16 pack-year smoking  history. He has never used smokeless tobacco. He reports that he drinks about 42.0 oz of alcohol per week. He reports that he uses illicit drugs (Marijuana).  Allergies: No Known Allergies  Medications: Patient's preadmission medications were reviewed by me.  ROS: History obtained from the patient  General ROS: negative for - chills, fatigue, fever, night sweats, weight gain or weight loss Psychological ROS: negative for - behavioral disorder, hallucinations, memory difficulties, mood swings or suicidal ideation Ophthalmic ROS: negative for - blurry vision, double vision, eye pain or loss of vision ENT ROS: negative for - epistaxis, nasal discharge, oral lesions, sore throat, tinnitus or vertigo Allergy and Immunology ROS: negative for - hives or itchy/watery eyes Hematological and Lymphatic ROS: negative for - bleeding problems, bruising or swollen lymph nodes Endocrine ROS: negative for - galactorrhea, hair pattern changes, polydipsia/polyuria or temperature intolerance Respiratory ROS: negative for - cough, hemoptysis, shortness of breath or wheezing Cardiovascular ROS: negative for - chest pain, dyspnea on exertion, edema or irregular heartbeat Gastrointestinal ROS: negative for - abdominal pain, diarrhea, hematemesis, nausea/vomiting or stool incontinence Genito-Urinary ROS: negative for - dysuria, hematuria, incontinence or urinary frequency/urgency Musculoskeletal ROS: negative for - joint swelling or muscular weakness Neurological ROS: as noted in HPI Dermatological ROS: negative for rash and skin lesion changes  Physical Examination: Blood pressure 190/89, pulse 80, temperature 97.9 F (36.6 C), temperature source Oral, resp. rate 16, SpO2 97 %.  HEENT-  Normocephalic, no lesions, without obvious abnormality.  Normal external eye and  conjunctiva.  Normal TM's bilaterally.  Normal auditory canals and external ears. Normal external nose, mucus membranes and septum.  Normal  pharynx. Neck supple with no masses, nodes, nodules or enlargement. Cardiovascular - regular rate and rhythm, S1, S2 normal, no murmur, click, rub or gallop Lungs - chest clear, no wheezing, rales, normal symmetric air entry Abdomen - soft, non-tender; bowel sounds normal; no masses,  no organomegaly Extremities - no joint deformities, effusion, or inflammation and no edema  Neurologic Examination: Mental Status: Alert, oriented, thought content appropriate.  Speech moderately slurred without evidence of aphasia. Able to follow commands without difficulty. Cranial Nerves: II-Visual fields were normal. III/IV/VI-Pupils were equal and reacted normally to light. Extraocular movements were full and conjugate.    V/VII-no facial numbness; moderate left lower facial weakness. VIII-normal. X-moderate dysarthria; symmetrical palatal movement. XI: trapezius strength/neck flexion strength normal bilaterally XII-midline tongue extension with normal strength. Motor: Minimal drift of left upper extremity; motor exam otherwise unremarkable. Sensory: Normal throughout, including vibratory sensation distally and lower extremities. Deep Tendon Reflexes: Trace to 1+ and symmetric in upper extremities and absent in lower extremities. Plantars: Mute bilaterally Cerebellar: Normal finger-to-nose testing. Carotid auscultation: Normal  Results for orders placed or performed during the hospital encounter of 03/20/15 (from the past 48 hour(s))  Protime-INR     Status: None   Collection Time: 03/20/15  3:36 PM  Result Value Ref Range   Prothrombin Time 13.0 11.6 - 15.2 seconds   INR 0.96 0.00 - 1.49  APTT     Status: None   Collection Time: 03/20/15  3:36 PM  Result Value Ref Range   aPTT 31 24 - 37 seconds  CBC     Status: Abnormal   Collection Time: 03/20/15  3:36 PM  Result Value Ref Range   WBC 4.3 4.0 - 10.5 K/uL   RBC 6.65 (H) 4.22 - 5.81 MIL/uL   Hemoglobin 20.6 (H) 13.0 - 17.0 g/dL   HCT 61.0  (H) 39.0 - 52.0 %   MCV 91.7 78.0 - 100.0 fL   MCH 31.0 26.0 - 34.0 pg   MCHC 33.8 30.0 - 36.0 g/dL   RDW 12.8 11.5 - 15.5 %   Platelets 98 (L) 150 - 400 K/uL    Comment: PLATELET COUNT CONFIRMED BY SMEAR  Differential     Status: Abnormal   Collection Time: 03/20/15  3:36 PM  Result Value Ref Range   Neutrophils Relative % 30 %   Neutro Abs 1.3 (L) 1.7 - 7.7 K/uL   Lymphocytes Relative 48 %   Lymphs Abs 2.1 0.7 - 4.0 K/uL   Monocytes Relative 12 %   Monocytes Absolute 0.5 0.1 - 1.0 K/uL   Eosinophils Relative 9 %   Eosinophils Absolute 0.4 0.0 - 0.7 K/uL   Basophils Relative 1 %   Basophils Absolute 0.0 0.0 - 0.1 K/uL  Comprehensive metabolic panel     Status: Abnormal   Collection Time: 03/20/15  3:36 PM  Result Value Ref Range   Sodium 135 135 - 145 mmol/L   Potassium 4.6 3.5 - 5.1 mmol/L   Chloride 101 101 - 111 mmol/L   CO2 24 22 - 32 mmol/L   Glucose, Bld 188 (H) 65 - 99 mg/dL   BUN 6 6 - 20 mg/dL   Creatinine, Ser 1.04 0.61 - 1.24 mg/dL   Calcium 9.0 8.9 - 10.3 mg/dL   Total Protein 6.3 (L) 6.5 - 8.1 g/dL   Albumin 2.9 (L) 3.5 - 5.0 g/dL  AST 26 15 - 41 U/L   ALT 19 17 - 63 U/L   Alkaline Phosphatase 88 38 - 126 U/L   Total Bilirubin 0.9 0.3 - 1.2 mg/dL   GFR calc non Af Amer >60 >60 mL/min   GFR calc Af Amer >60 >60 mL/min    Comment: (NOTE) The eGFR has been calculated using the CKD EPI equation. This calculation has not been validated in all clinical situations. eGFR's persistently <60 mL/min signify possible Chronic Kidney Disease.    Anion gap 10 5 - 15  CBG monitoring, ED     Status: Abnormal   Collection Time: 03/20/15  3:36 PM  Result Value Ref Range   Glucose-Capillary 182 (H) 65 - 99 mg/dL  I-stat troponin, ED (not at [facility])     Status: None   Collection Time: 03/20/15  3:45 PM  Result Value Ref Range   Troponin i, poc 0.00 0.00 - 0.08 ng/mL   Comment 3            Comment: Due to the release kinetics of cTnI, a negative result within the  first hours of the onset of symptoms does not rule out myocardial infarction with certainty. If myocardial infarction is still suspected, repeat the test at appropriate intervals.    Ct Head Wo Contrast  03/20/2015  CLINICAL DATA:  50 year old male with left facial droop since 1600 hours yesterday. Initial encounter. EXAM: CT HEAD WITHOUT CONTRAST TECHNIQUE: Contiguous axial images were obtained from the base of the skull through the vertex without intravenous contrast. COMPARISON:  None. FINDINGS: Chronic right lamina papyracea fracture with superimposed widespread paranasal sinus mucosal thickening and opacification, sparing the sphenoid sinuses. Tympanic cavities and mastoids are clear. No acute osseous abnormality identified. No acute orbit or scalp soft tissue findings. Calcified atherosclerosis at the skull base. Cerebral volume is normal. Multifocal right side deep white matter capsule hypodensity (series 2, image 19). Less pronounced left hemisphere patchy white matter hypodensity. Dystrophic basal ganglia calcifications. Chronic appearing small to medium size infarcts in the left cerebellum. Probable venous variant in the right ambient cisterns series 2, image 12. No asymmetric intracranial vascular hyperdensity identified. No cortically based acute infarct identified. No acute intracranial hemorrhage identified. No midline shift, mass effect, or evidence of intracranial mass lesion. No ventriculomegaly. IMPRESSION: 1. Age advanced chronic ischemic disease, including age indeterminate right deep white matter capsule involvement. 2. Widespread paranasal sinus inflammation. Electronically Signed   By: Genevie Ann M.D.   On: 03/20/2015 16:12   Mr Angiogram Head Wo Contrast  03/21/2015  CLINICAL DATA:  Initial evaluation for acute speech difficulty. EXAM: MRI HEAD WITHOUT CONTRAST MRA HEAD WITHOUT CONTRAST TECHNIQUE: Multiplanar, multiecho pulse sequences of the brain and surrounding structures were  obtained without intravenous contrast. Angiographic images of the head were obtained using MRA technique without contrast. COMPARISON:  Prior CT from 03/20/2015. FINDINGS: MRI HEAD FINDINGS Diffuse prominence of the CSF containing spaces is compatible with generalized age-related cerebral atrophy. Patchy T2/FLAIR hyperintensity within the periventricular and deep white matter both cerebral hemispheres most consistent with chronic small vessel ischemic disease. This is moderate in nature. Few scattered remote lacunar infarcts within the bilateral corona radiata. Small remote lacunar infarcts within the bilateral thalami and basal ganglia. Remote left cerebellar infarct present. Curvilinear focus of restricted diffusion measuring approximately 2 cm present within the posterior right lentiform nucleus extending into the right corona radiata, consistent with acute ischemic nonhemorrhagic infarct. No associated mass effect or hemorrhage. No other areas  of infarction. Minimal patchy high signal on axial either overlie sequence within the anterior right corona radiata and left corpus callosum favored to be related to susceptibility artifact from remote lacunar infarcts within these regions. Abnormal flow void within the distal left vertebral artery, which may related to slow flow and/or atheromatous disease. Major intracranial vascular flow voids are otherwise maintained. No acute intracranial hemorrhage. No areas of chronic hemorrhage. No mass lesion, midline shift, or mass effect. No hydrocephalus. No extra-axial fluid collection. Major dural sinuses are patent. Craniocervical junction within normal limits. No acute abnormality within the upper cervical spine. Pituitary gland within normal limits. No acute abnormality about the orbits. Mucosal thickening throughout the ethmoidal air cells and maxillary sinuses. Right frontal sinus is largely opacified. No significant mastoid effusion. Inner ear structures grossly normal.  Bone marrow signal intensity within normal limits. No scalp soft tissue abnormality. MRA HEAD FINDINGS ANTERIOR CIRCULATION: Visualized distal cervical segments of the internal carotid arteries are patent with antegrade flow. Petrous segments patent. Multi focal atheromatous irregularity throughout the cavernous/ supraclinoid ICAs with secondary mild to moderate multi focal narrowing, slightly worse on the left. A1 segments patent. Anterior communicating artery within normal limits. Anterior cerebral arteries opacified to their distal aspects. M1 segments mildly irregular without high-grade stenosis or occlusion. MCA bifurcations within normal limits. No proximal M2 branch occlusion. Distal MCA branches opacified and fairly symmetric. Multifocal atheromatous irregularity within the distal MCA branches bilaterally. POSTERIOR CIRCULATION: Vertebral arteries patent to the vertebrobasilar junction. Right vertebral artery dominant. Posterior inferior cerebral arteries not well evaluated on this exam. Basilar artery tortuous with mild smooth narrowing without focal high-grade stenosis. Dominant right anterior inferior cerebral artery. Probable high-grade stenosis at the origin of the left superior cerebral artery. Superior cerebral arteries otherwise well opacified. Both posterior cerebral arteries arise from the basilar artery and are well opacified to their distal aspects. Multifocal atheromatous irregularity with stenoses present within the PCAs, right slightly worse than left. No aneurysm or vascular malformation. IMPRESSION: MRI HEAD IMPRESSION: 1. Approximately 2 cm curvilinear acute ischemic infarct involving the posterior right lentiform nucleus/corona radiata. No associated hemorrhage or mass effect. 2. Generalized cerebral atrophy with moderate chronic small vessel ischemic disease and multiple remote infarcts as detailed above. 3. Inflammatory paranasal sinus disease as above. MRA HEAD IMPRESSION: 1. No large  or proximal arterial branch occlusion within the intracranial circulation. 2. No high-grade or correctable stenosis within the intracranial circulation. 3. Multifocal atheromatous irregularity throughout the carotid siphons with mild to moderate multi focal narrowing. 4. Additional multi focal atheromatous irregularity throughout the intracranial circulation as detailed above, most evident within the distal MCA branches and PCAs bilaterally. Electronically Signed   By: Jeannine Boga M.D.   On: 03/21/2015 02:13   Mr Brain Wo Contrast  03/21/2015  CLINICAL DATA:  Initial evaluation for acute speech difficulty. EXAM: MRI HEAD WITHOUT CONTRAST MRA HEAD WITHOUT CONTRAST TECHNIQUE: Multiplanar, multiecho pulse sequences of the brain and surrounding structures were obtained without intravenous contrast. Angiographic images of the head were obtained using MRA technique without contrast. COMPARISON:  Prior CT from 03/20/2015. FINDINGS: MRI HEAD FINDINGS Diffuse prominence of the CSF containing spaces is compatible with generalized age-related cerebral atrophy. Patchy T2/FLAIR hyperintensity within the periventricular and deep white matter both cerebral hemispheres most consistent with chronic small vessel ischemic disease. This is moderate in nature. Few scattered remote lacunar infarcts within the bilateral corona radiata. Small remote lacunar infarcts within the bilateral thalami and basal ganglia. Remote left cerebellar infarct present.  Curvilinear focus of restricted diffusion measuring approximately 2 cm present within the posterior right lentiform nucleus extending into the right corona radiata, consistent with acute ischemic nonhemorrhagic infarct. No associated mass effect or hemorrhage. No other areas of infarction. Minimal patchy high signal on axial either overlie sequence within the anterior right corona radiata and left corpus callosum favored to be related to susceptibility artifact from remote  lacunar infarcts within these regions. Abnormal flow void within the distal left vertebral artery, which may related to slow flow and/or atheromatous disease. Major intracranial vascular flow voids are otherwise maintained. No acute intracranial hemorrhage. No areas of chronic hemorrhage. No mass lesion, midline shift, or mass effect. No hydrocephalus. No extra-axial fluid collection. Major dural sinuses are patent. Craniocervical junction within normal limits. No acute abnormality within the upper cervical spine. Pituitary gland within normal limits. No acute abnormality about the orbits. Mucosal thickening throughout the ethmoidal air cells and maxillary sinuses. Right frontal sinus is largely opacified. No significant mastoid effusion. Inner ear structures grossly normal. Bone marrow signal intensity within normal limits. No scalp soft tissue abnormality. MRA HEAD FINDINGS ANTERIOR CIRCULATION: Visualized distal cervical segments of the internal carotid arteries are patent with antegrade flow. Petrous segments patent. Multi focal atheromatous irregularity throughout the cavernous/ supraclinoid ICAs with secondary mild to moderate multi focal narrowing, slightly worse on the left. A1 segments patent. Anterior communicating artery within normal limits. Anterior cerebral arteries opacified to their distal aspects. M1 segments mildly irregular without high-grade stenosis or occlusion. MCA bifurcations within normal limits. No proximal M2 branch occlusion. Distal MCA branches opacified and fairly symmetric. Multifocal atheromatous irregularity within the distal MCA branches bilaterally. POSTERIOR CIRCULATION: Vertebral arteries patent to the vertebrobasilar junction. Right vertebral artery dominant. Posterior inferior cerebral arteries not well evaluated on this exam. Basilar artery tortuous with mild smooth narrowing without focal high-grade stenosis. Dominant right anterior inferior cerebral artery. Probable  high-grade stenosis at the origin of the left superior cerebral artery. Superior cerebral arteries otherwise well opacified. Both posterior cerebral arteries arise from the basilar artery and are well opacified to their distal aspects. Multifocal atheromatous irregularity with stenoses present within the PCAs, right slightly worse than left. No aneurysm or vascular malformation. IMPRESSION: MRI HEAD IMPRESSION: 1. Approximately 2 cm curvilinear acute ischemic infarct involving the posterior right lentiform nucleus/corona radiata. No associated hemorrhage or mass effect. 2. Generalized cerebral atrophy with moderate chronic small vessel ischemic disease and multiple remote infarcts as detailed above. 3. Inflammatory paranasal sinus disease as above. MRA HEAD IMPRESSION: 1. No large or proximal arterial branch occlusion within the intracranial circulation. 2. No high-grade or correctable stenosis within the intracranial circulation. 3. Multifocal atheromatous irregularity throughout the carotid siphons with mild to moderate multi focal narrowing. 4. Additional multi focal atheromatous irregularity throughout the intracranial circulation as detailed above, most evident within the distal MCA branches and PCAs bilaterally. Electronically Signed   By: Jeannine Boga M.D.   On: 03/21/2015 02:13    Assessment: 50 y.o. male with multiple risk factors for stroke presenting with acute right lenticular nucleus/corona radiata ischemic infarction.  Stroke Risk Factors - diabetes mellitus, family history, hyperlipidemia, hypertension, smoking and cocaine use  Plan: 1. HgbA1c, fasting lipid panel 2. PT consult, OT consult, Speech consult 3. Echocardiogram 4. Carotid dopplers 5. Prophylactic therapy-Antiplatelet med: Aspirin 6. Risk factor modification 7. Telemetry monitoring  C.R. [name], MD [facility] [phone]  03/21/2015, 2:25 AM

## 2015-03-21 NOTE — Evaluation (Signed)
Clinical/Bedside Swallow Evaluation Patient Details  Name: Eric Morton MRN: [id number] Date of Birth: 03/11/1965  Today's Date: 03/21/2015 Time: SLP Start Time (ACUTE ONLY): 72 SLP Stop Time (ACUTE ONLY): 1650 SLP Time Calculation (min) (ACUTE ONLY): 20 min  Past Medical History:  Past Medical History  Diagnosis Date  . Hypertension   . Substance abuse   . Peripheral neuropathy ([place])   . Hypercholesterolemia   . Type II diabetes mellitus ([place])   . 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 Chronic kidney disease     "jsut a little damage"  . Anxiety   . History of stomach ulcers     not confirmed, never underwent diagnostic endo or radiology, this manifested as epigastric distress.   . Polycythemia vera ([place]) 2006  . Hx of adenomatous colonic polyps    Past Surgical History:  Past Surgical History  Procedure Laterality Date  . Irrigation and debridement abscess  09/26/2011    Procedure: IRRIGATION AND DEBRIDEMENT ABSCESS;  Surgeon: Imogene Burn. Georgette Dover, MD;  Location: [facility];  Service: General;  Laterality: Left;  Incision and Drainage left breast abscess  . Colonoscopy with propofol N/A 04/28/2014    Procedure: COLONOSCOPY WITH PROPOFOL;  Surgeon: Gatha Mayer, MD;  Location: [facility];  Service: Endoscopy;  Laterality: N/A;   HPI:  Pt is a 50 y.o. male with PMH polycythemia vera, HTN, DM, dosent take any of his meds or follow with oncology anymore, cocaine abuse, EtOH abuse, smoking.Presented to ED 2/21 with slurred speech and difficulty swallowing. MRI showed 2 cm acute ischemic infarct of the posterior R lentiform nucleus/ corona radiata, multiple remote infarcts. Bedside swallow eval ordered to further assess swallow function.   Assessment / Plan / Recommendation Clinical Impression  Pt showed no overt s/s of aspiration despite consuming PO trials at rapid rate. Subjectively swallow appeared timely with adequate hyolaryngeal excursion, no changes in voice  quality. It appears s/s of swallowing difficulty present upon arrival have resolved. Pt is alert/ oriented x4. Aspiration risk appears mild at this time. Recommend initiating regular diet, thin liquids, meds whole with liquid. Provided strategy education to pt, including small bites/ sips at a time and chewing food on R side of mouth given L facial droop/ weakness. Will f/u for diet check and for speech language eval as pt continues to have a mild-moderate dysarthria.    Aspiration Risk  Mild aspiration risk    Diet Recommendation Regular;Thin liquid   Liquid Administration via: Cup;Straw Medication Administration: Whole meds with liquid Supervision: Patient able to self feed;Intermittent supervision to cue for compensatory strategies Compensations: Slow rate;Small sips/bites Postural Changes: Seated upright at 90 degrees    Other  Recommendations Oral Care Recommendations: Oral care BID   Follow up Recommendations  Other (comment) (TBD)    Frequency and Duration min 1 x/week  1 week       Prognosis        Swallow Study   General HPI: Pt is a 50 y.o. male with PMH polycythemia vera, HTN, DM, dosent take any of his meds or follow with oncology anymore, cocaine abuse, EtOH abuse, smoking.Presented to ED 2/21 with slurred speech and difficulty swallowing. MRI showed 2 cm acute ischemic infarct of the posterior R lentiform nucleus/ corona radiata, multiple remote infarcts. Bedside swallow eval ordered to further assess swallow function. Type of Study: Bedside Swallow Evaluation Diet Prior to this Study: NPO Temperature Spikes Noted: No Respiratory Status: Room air  History of Recent Intubation: No Behavior/Cognition: Alert;Cooperative;Pleasant mood Oral Cavity Assessment: Within Functional Limits Oral Cavity - Dentition: Poor condition;Missing dentition Vision: Functional for self-feeding Self-Feeding Abilities: Able to feed self Patient Positioning: Upright in chair Baseline Vocal  Quality: Normal Volitional Cough: Strong Volitional Swallow: Able to elicit    Oral/Motor/Sensory Function Overall Oral Motor/Sensory Function: Moderate impairment Facial ROM: Reduced left Facial Symmetry: Abnormal symmetry left;Suspected CN VII (facial) dysfunction Facial Strength: Reduced left Facial Sensation: Within Functional Limits Lingual ROM: Within Functional Limits Lingual Symmetry: Within Functional Limits Lingual Strength: Within Functional Limits   Ice Chips Ice chips: Not tested   Thin Liquid Thin Liquid: Within functional limits Presentation: Cup;Straw    Nectar Thick Nectar Thick Liquid: Not tested   Honey Thick Honey Thick Liquid: Not tested   Puree Puree: Within functional limits Presentation: Self Fed;Spoon   Solid   GO   Solid: Within functional limits Presentation: Self Madison Hickman, MA, CCC-SLP 03/21/2015,5:05 PM  [phone]

## 2015-03-21 NOTE — ED Notes (Signed)
Speech Therapy at bedside

## 2015-03-21 NOTE — Progress Notes (Signed)
Subjective: Patient admitted this morning, see detailed H&P by Dr. Alcario Drought. 50 y.o. male with h/o polycythemia vera, HTN, DM, dosent take any of his meds or follow with oncology anymore, cocaine abuse, EtOH abuse, smoking. Patient presents to the ED with c/o difficulty speaking, slurred speech, and ultimately difficulty swallowing which has been constant since onset at about 1pm yesterday afternoon. He had numbness to left lip and hand yesterday which has resolved. He initially ignored symptoms but decided to come in after he had difficulty swallowing. Patient denies any complaints this morning Filed Vitals:   03/21/15 0900 03/21/15 0930  BP: 175/98 147/99  Pulse: 74 71  Temp:    Resp: 13 11    Chest: Clear Bilaterally Heart : S1S2 RRR Abdomen: Soft, nontender Ext : No edema Neuro: Alert, oriented x 3  A/P CVA Hypertension Diabetes mellitus EtOH abuse Polycythemia vera  Patient admitted for stroke workup , started on CIWA protocol. Neurology following    [facility] Pager- [phone]

## 2015-03-21 NOTE — ED Notes (Signed)
Dr. Jared at bedside. 

## 2015-03-22 ENCOUNTER — Inpatient Hospital Stay (HOSPITAL_COMMUNITY): Payer: MEDICAID

## 2015-03-22 ENCOUNTER — Inpatient Hospital Stay (HOSPITAL_COMMUNITY): Payer: Self-pay

## 2015-03-22 DIAGNOSIS — Z8673 Personal history of transient ischemic attack (TIA), and cerebral infarction without residual deficits: Secondary | ICD-10-CM | POA: Insufficient documentation

## 2015-03-22 DIAGNOSIS — I6350 Cerebral infarction due to unspecified occlusion or stenosis of unspecified cerebral artery: Secondary | ICD-10-CM

## 2015-03-22 LAB — GLUCOSE, CAPILLARY
GLUCOSE-CAPILLARY: 164 mg/dL — AB (ref 65–99)
GLUCOSE-CAPILLARY: 163 mg/dL — AB (ref 65–99)
Glucose-Capillary: 122 mg/dL — ABNORMAL HIGH (ref 65–99)
Glucose-Capillary: 228 mg/dL — ABNORMAL HIGH (ref 65–99)
Glucose-Capillary: 335 mg/dL — ABNORMAL HIGH (ref 65–99)

## 2015-03-22 LAB — RAPID URINE DRUG SCREEN, HOSP PERFORMED
Amphetamines: NOT DETECTED
BARBITURATES: NOT DETECTED
Benzodiazepines: NOT DETECTED
Cocaine: POSITIVE — AB
Opiates: NOT DETECTED
TETRAHYDROCANNABINOL: POSITIVE — AB

## 2015-03-22 LAB — HEMOGLOBIN A1C
HEMOGLOBIN A1C: 8.2 % — AB (ref 4.8–5.6)
MEAN PLASMA GLUCOSE: 189 mg/dL

## 2015-03-22 MED ORDER — EZETIMIBE 10 MG PO TABS
10.0000 mg | ORAL_TABLET | Freq: Every day | ORAL | Status: DC
  Administered 2015-03-22 – 2015-03-23 (×2): 10 mg via ORAL
  Filled 2015-03-22 (×2): qty 1

## 2015-03-22 MED ORDER — ASPIRIN EC 325 MG PO TBEC
325.0000 mg | DELAYED_RELEASE_TABLET | Freq: Every day | ORAL | Status: DC
  Administered 2015-03-23: 325 mg via ORAL
  Filled 2015-03-22: qty 1

## 2015-03-22 MED ORDER — ATORVASTATIN CALCIUM 40 MG PO TABS: 40.0000 mg | ORAL_TABLET | Freq: Every day | ORAL | Status: DC

## 2015-03-22 NOTE — Progress Notes (Signed)
Hourly rounding performed. Call light within reach. Pt in no acute distress. Denies needs. Visitor at bedside. Pt does not call out when he needs to urinate. Pt ambulating independently. Will start signing off pt. md told rn that pt is walking by himself.

## 2015-03-22 NOTE — Progress Notes (Signed)
Hospitalist and neurology aware of pts blood pressure, no orders at this time. Pt stable.

## 2015-03-22 NOTE — Progress Notes (Signed)
  Echocardiogram 2D Echocardiogram has been performed.  [name] 03/22/2015, 11:48 AM

## 2015-03-22 NOTE — Progress Notes (Signed)
STROKE TEAM PROGRESS NOTE   HISTORY OF PRESENT ILLNESS Eric Morton is an 50 y.o. male history of hypertension, substance abuse including cocaine, diabetes mellitus, hyperlipidemia and polycythemia, presenting with left facial weakness and slurred speech since 3 PM on 03/19/2015. He had difficulty with swallowing as well, especially liquids. He's also noticed numbness involving left side of his mouth. MRI of his brain showed an acute right lenticular nucleus/corona radiata acute ischemic infarction. Multiple old involving basal ganglia also noted. MRA showed no large vessel occlusion or significant stenosis. NIH stroke score was 4.  LSN: 3:00 PM on 03/19/2015 tPA Given: No: On time window for treatment consideration mRankin:   SUBJECTIVE (INTERVAL HISTORY) He feels his speech is better, no family at bedtime, no new symptoms. Was not taking aspirin. Lipitor made him grow one breast.   OBJECTIVE Temp:  [97.9 F (36.6 C)-98.9 F (37.2 C)] 97.9 F (36.6 C) (02/23 0534) Pulse Rate:  [67-89] 78 (02/23 0534) Cardiac Rhythm:  [-] Normal sinus rhythm (02/22 2030) Resp:  [11-25] 20 (02/23 0534) BP: (138-181)/(68-99) 157/90 mmHg (02/23 0534) SpO2:  [93 %-100 %] 100 % (02/23 0534) Weight:  [75.524 kg (166 lb 8 oz)] 75.524 kg (166 lb 8 oz) (02/22 1941)  CBC:  Recent Labs Lab 03/20/15 1536  WBC 4.3  NEUTROABS 1.3*  HGB 20.6*  HCT 61.0*  MCV 91.7  PLT 98*    Basic Metabolic Panel:  Recent Labs Lab 03/20/15 1536  NA 135  K 4.6  CL 101  CO2 24  GLUCOSE 188*  BUN 6  CREATININE 1.04  CALCIUM 9.0    Lipid Panel:    Component Value Date/Time   CHOL 219* 03/21/2015 0339   TRIG 175* 03/21/2015 0339   HDL 64 03/21/2015 0339   CHOLHDL 3.4 03/21/2015 0339   VLDL 35 03/21/2015 0339   LDLCALC 120* 03/21/2015 0339   HgbA1c:  Lab Results  Component Value Date   HGBA1C 8.2* 03/21/2015   Urine Drug Screen:    Component Value Date/Time   LABOPIA NONE DETECTED 04/25/2014 1751   LABOPIA NEG 11/10/2007 2043   COCAINSCRNUR POSITIVE* 04/25/2014 1751   COCAINSCRNUR POS* 11/10/2007 2043   LABBENZ NONE DETECTED 04/25/2014 1751   LABBENZ NEG 11/10/2007 2043   AMPHETMU NONE DETECTED 04/25/2014 1751   AMPHETMU NEG 11/10/2007 2043   THCU POSITIVE* 04/25/2014 1751   LABBARB NONE DETECTED 04/25/2014 1751      IMAGING  Ct Head Wo Contrast 03/20/2015   1. Age advanced chronic ischemic disease, including age indeterminate right deep white matter capsule involvement.  2. Widespread paranasal sinus inflammation.   Mr Angiogram Head Wo Contrast 03/21/2015    MRI HEAD IMPRESSION:  1. Approximately 2 cm curvilinear acute ischemic infarct involving the posterior right lentiform nucleus/corona radiata. No associated hemorrhage or mass effect.  2. Generalized cerebral atrophy with moderate chronic small vessel ischemic disease and multiple remote infarcts as detailed above.  3. Inflammatory paranasal sinus disease as above.  4. Few scattered remote lacunar infarcts within the bilateral corona radiata. Small remote lacunar infarcts within the bilateral thalami and basal ganglia. Remote left cerebellar infarct present.   MRA HEAD IMPRESSION:  1. No large or proximal arterial branch occlusion within the intracranial circulation.  2. No high-grade or correctable stenosis within the intracranial circulation.  3. Multifocal atheromatous irregularity throughout the carotid siphons with mild to moderate multi focal narrowing.  4. Additional multi focal atheromatous irregularity throughout the intracranial circulation as detailed above, most evident within the  distal MCA branches and PCAs bilaterally.     PHYSICAL EXAM  Physical exam: Exam: Gen: NAD, poor dentition                     CV: RRR, no MRG. No Carotid Bruits. No peripheral edema, warm, nontender Eyes: Conjunctivae clear without exudates or hemorrhage  Neuro: Detailed Neurologic Exam  Speech:    Speech is  dysarthric;  Cognition:    The patient is oriented to person, place, and time;     normal attention, concentration,    Cranial Nerves:    The pupils are equal, round, and reactive to light.  Attempted fundi could not visualize. Visual fields are full to finger confrontation. Proptosis of both eyes, Extraocular movements are intact. Trigeminal sensation is intact and the muscles of mastication are normal. Left lower facial weakness. The palate elevates in the midline. Hearing intact. Voice is normal. Shoulder shrug is normal. The tongue has normal motion without fasciculations.   Coordination:    Normal finger to nose and heel to shin.  Gait:    Gets up independently ambulates to bed fine  Motor Observation:    No asymmetry, no atrophy, and no involuntary movements noted. Tone:    Normal muscle tone.    Posture:    Posture is normal. normal erect    Strength:    Strength is V/V in the upper and lower limbs.      Sensation: intact to LT     Reflex Exam:  DTR's:  Absent lowers. 1+ uppers.   Toes:    The toes are equivocal bilaterally.         ASSESSMENT/PLAN Mr. Eric Morton is a 50 y.o. male with history of hypertension, substance abuse, hyperlipidemia, diabetes mellitus, alcoholic liver damage, kidney disease, gastric ulcers, and polycythemia vera presenting with left facial weakness, slurred speech, difficulty swallowing, and numbness involving the left side of his mouth.  He did not receive IV t-PA due to late presentation.  Stroke:  Non-dominant infarct secondary to small vessel disease.  Resultant  Dysarthria and left lower facial weakness  MRI - Approximately 2 cm curvilinear acute ischemic infarct involving the posterior right lentiform nucleus/corona radiata.  MRA - multi focal atheromatous irregularity  Carotid Doppler - pending  2D Echo - pending  LDL - 120  HgbA1c 8.2  VTE prophylaxis - Lovenox  Diet heart healthy/carb modified Room service  appropriate?: Yes; Fluid consistency:: Thin  No antithrombotic prior to admission, now on aspirin 325 mg daily  Patient counseled to be compliant with his antithrombotic medications  Ongoing aggressive stroke risk factor management  Therapy recommendations: Pending  Disposition: Pending  Hypertension  Stable - mildly high  Permissive hypertension (OK if < 220/120) but gradually normalize in 5-7 days  Hyperlipidemia  Home meds: No lipid lowering medications prior to admission.  LDL 120, goal < 70  Declines Lipitor or statin due to side effects in the past  Continue statin at discharge  Diabetes  HgbA1c 8.2, goal < 7.0  Uncontrolled  Other Stroke Risk Factors  Cigarette smoker, advised to stop smoking  ETOH use  Hx stroke/TIA by MRI   Other Active Problems  Substance abuse UDS positive in the past - check this admission  Hospital day # [place] PA-C [facility] Pager [phone] 03/22/2015, 10:23 AM   Personally examined patient and images, and have participated in and made any corrections needed to history, physical, neuro exam,assessment and  plan as stated above.  I have personally obtained the history, evaluated lab date, reviewed imaging studies and agree with radiology interpretations.    Sarina Ill, MD Stroke Neurology [phone] [facility]     To contact Stroke Continuity provider, please refer to [email or web address] After hours, contact General Neurology

## 2015-03-22 NOTE — Progress Notes (Signed)
md made aware of pts blood pressure

## 2015-03-22 NOTE — Progress Notes (Signed)
PT Cancellation Note  Patient Details Name: Eric Morton MRN: [id number] DOB: 09-25-1965   Cancelled Treatment:    Reason Eval/Treat Not Completed: Patient at procedure or test/unavailable.  Will try later as time allows.   Ramond Dial 03/22/2015, 1:29 PM   Mee Hives, PT MS Acute Rehab Dept. Number: [phone] [id number] and [place] [phone]

## 2015-03-22 NOTE — Progress Notes (Signed)
*  PRELIMINARY RESULTS* Vascular Ultrasound Carotid Duplex (Doppler) has been completed.  There is no obvious evidence of hemodynamically significant internal carotid artery stenosis bilaterally. Vertebral arteries are patent with antegrade flow.  03/22/2015 3:43 PM Maudry Mayhew, RVT, RDCS, RDMS

## 2015-03-22 NOTE — Care Management Note (Addendum)
Case Management Note  Patient Details  Name: JAHMIR SOKALSKI MRN: [id number] Date of Birth: [birth year]  Subjective/Objective:                    Action/Plan: Patient was admitted with CVA. Lives at home alone. Patient is currently listed as self-pay and is being followed by Langley Gauss in [employer]. Will follow for discharge needs pending PT/OT evals and physician orders.  Expected Discharge Date:                  Expected Discharge Plan:     In-House Referral:     Discharge planning Services     Post Acute Care Choice:    Choice offered to:     DME Arranged:    DME Agency:     HH Arranged:    HH Agency:     Status of Service:  In process, will continue to follow  Medicare Important Message Given:    Date Medicare IM Given:    Medicare IM give by:    Date Additional Medicare IM Given:    Additional Medicare Important Message give by:     If discussed at [place] of Stay Meetings, dates discussed:    Additional Comments:  Rolm Baptise, RN 03/22/2015, 11:23 AM [phone]

## 2015-03-22 NOTE — Progress Notes (Signed)
[facility] PROGRESS NOTE  Eric Morton [id number] DOB: November 12, 1965 DOA: 03/20/2015 PCP: Lance Bosch, NP  Assessment/Plan: 1. CVA- MRI brain showed 2 cm acute ischemic infarct involving the posterior right lentiform nucleus/corona radiata. Patient seen by neurology, continue aspirin 325 mg by mouth daily. 2. 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 I called and discussed Dr. Alen Blew, who agrees to see the patient in 2-3 weeks as outpatient. No acute intervention required at this time. 3. Diabetes mellitus- continue sliding-scale insulin with NovoLog. 4. DVT prophylaxis-Lovenox   Code Status: Full code Family Communication: No family at bedside Disposition Plan: Pending improvement in neurological symptoms, and complete the workup of stroke   Consultants: Neurology   Procedures:  Carotid Dopplers  Echocardiogram  Antibiotics:  None    HPI/Subjective: 50 y.o. male with h/o polycythemia vera, HTN, DM, dosent take any of his meds or follow with oncology anymore, cocaine abuse, EtOH abuse, smoking. Patient presents to the ED with c/o difficulty speaking, slurred speech, and ultimately difficulty swallowing which has been constant since onset at about 1pm yesterday afternoon. He had numbness to left lip and hand yesterday which has resolved. He initially ignored symptoms but decided to come in after he had difficulty swallowing. Patient denies any symptoms this morning, he passed a swallow evaluation started on regular diet.  As per patient he has not seen oncology since 2007 for polycythemia.  Objective: Filed Vitals:   03/22/15 1200 03/22/15 1438  BP: 183/98 183/115  Pulse: 78 76  Temp:  98.6 F (37 C)  Resp:  18   No intake or output data in the 24 hours ending 03/22/15 1551 Filed Weights   03/21/15 1941  Weight: 75.524 kg (166 lb 8 oz)    Exam:   General:  Appears in no acute distress  Cardiovascular: S1-S2  regular  Respiratory: Clear to auscultation bilaterally  Abdomen: Soft, nontender, no organomegaly  Musculoskeletal: No cyanosis/clubbing/edema of the lower extremities   Data Reviewed: Basic Metabolic Panel:  Recent Labs Lab 03/20/15 1536  NA 135  K 4.6  CL 101  CO2 24  GLUCOSE 188*  BUN 6  CREATININE 1.04  CALCIUM 9.0   Liver Function Tests:  Recent Labs Lab 03/20/15 1536  AST 26  ALT 19  ALKPHOS 88  BILITOT 0.9  PROT 6.3*  ALBUMIN 2.9*   CBC:  Recent Labs Lab 03/20/15 1536  WBC 4.3  NEUTROABS 1.3*  HGB 20.6*  HCT 61.0*  MCV 91.7  PLT 98*    CBG:  Recent Labs Lab 03/21/15 2005 03/21/15 2358 03/22/15 0417 03/22/15 0748 03/22/15 1149  GLUCAP 307* 158* 164* 122* 228*    No results found for this or any previous visit (from the past 240 hour(s)).   Studies: Ct Head Wo Contrast  03/20/2015  CLINICAL DATA:  50 year old male with left facial droop since 1600 hours yesterday. Initial encounter. EXAM: CT HEAD WITHOUT CONTRAST TECHNIQUE: Contiguous axial images were obtained from the base of the skull through the vertex without intravenous contrast. COMPARISON:  None. FINDINGS: Chronic right lamina papyracea fracture with superimposed widespread paranasal sinus mucosal thickening and opacification, sparing the sphenoid sinuses. Tympanic cavities and mastoids are clear. No acute osseous abnormality identified. No acute orbit or scalp soft tissue findings. Calcified atherosclerosis at the skull base. Cerebral volume is normal. Multifocal right side deep white matter capsule hypodensity (series 2, image 19). Less pronounced left hemisphere patchy white matter hypodensity. Dystrophic basal ganglia calcifications. Chronic appearing small  to medium size infarcts in the left cerebellum. Probable venous variant in the right ambient cisterns series 2, image 12. No asymmetric intracranial vascular hyperdensity identified. No cortically based acute infarct identified. No  acute intracranial hemorrhage identified. No midline shift, mass effect, or evidence of intracranial mass lesion. No ventriculomegaly. IMPRESSION: 1. Age advanced chronic ischemic disease, including age indeterminate right deep white matter capsule involvement. 2. Widespread paranasal sinus inflammation. Electronically Signed   By: Genevie Ann M.D.   On: 03/20/2015 16:12   Mr Angiogram Head Wo Contrast  03/21/2015  CLINICAL DATA:  Initial evaluation for acute speech difficulty. EXAM: MRI HEAD WITHOUT CONTRAST MRA HEAD WITHOUT CONTRAST TECHNIQUE: Multiplanar, multiecho pulse sequences of the brain and surrounding structures were obtained without intravenous contrast. Angiographic images of the head were obtained using MRA technique without contrast. COMPARISON:  Prior CT from 03/20/2015. FINDINGS: MRI HEAD FINDINGS Diffuse prominence of the CSF containing spaces is compatible with generalized age-related cerebral atrophy. Patchy T2/FLAIR hyperintensity within the periventricular and deep white matter both cerebral hemispheres most consistent with chronic small vessel ischemic disease. This is moderate in nature. Few scattered remote lacunar infarcts within the bilateral corona radiata. Small remote lacunar infarcts within the bilateral thalami and basal ganglia. Remote left cerebellar infarct present. Curvilinear focus of restricted diffusion measuring approximately 2 cm present within the posterior right lentiform nucleus extending into the right corona radiata, consistent with acute ischemic nonhemorrhagic infarct. No associated mass effect or hemorrhage. No other areas of infarction. Minimal patchy high signal on axial either overlie sequence within the anterior right corona radiata and left corpus callosum favored to be related to susceptibility artifact from remote lacunar infarcts within these regions. Abnormal flow void within the distal left vertebral artery, which may related to slow flow and/or atheromatous  disease. Major intracranial vascular flow voids are otherwise maintained. No acute intracranial hemorrhage. No areas of chronic hemorrhage. No mass lesion, midline shift, or mass effect. No hydrocephalus. No extra-axial fluid collection. Major dural sinuses are patent. Craniocervical junction within normal limits. No acute abnormality within the upper cervical spine. Pituitary gland within normal limits. No acute abnormality about the orbits. Mucosal thickening throughout the ethmoidal air cells and maxillary sinuses. Right frontal sinus is largely opacified. No significant mastoid effusion. Inner ear structures grossly normal. Bone marrow signal intensity within normal limits. No scalp soft tissue abnormality. MRA HEAD FINDINGS ANTERIOR CIRCULATION: Visualized distal cervical segments of the internal carotid arteries are patent with antegrade flow. Petrous segments patent. Multi focal atheromatous irregularity throughout the cavernous/ supraclinoid ICAs with secondary mild to moderate multi focal narrowing, slightly worse on the left. A1 segments patent. Anterior communicating artery within normal limits. Anterior cerebral arteries opacified to their distal aspects. M1 segments mildly irregular without high-grade stenosis or occlusion. MCA bifurcations within normal limits. No proximal M2 branch occlusion. Distal MCA branches opacified and fairly symmetric. Multifocal atheromatous irregularity within the distal MCA branches bilaterally. POSTERIOR CIRCULATION: Vertebral arteries patent to the vertebrobasilar junction. Right vertebral artery dominant. Posterior inferior cerebral arteries not well evaluated on this exam. Basilar artery tortuous with mild smooth narrowing without focal high-grade stenosis. Dominant right anterior inferior cerebral artery. Probable high-grade stenosis at the origin of the left superior cerebral artery. Superior cerebral arteries otherwise well opacified. Both posterior cerebral arteries  arise from the basilar artery and are well opacified to their distal aspects. Multifocal atheromatous irregularity with stenoses present within the PCAs, right slightly worse than left. No aneurysm or vascular malformation. IMPRESSION: MRI HEAD  IMPRESSION: 1. Approximately 2 cm curvilinear acute ischemic infarct involving the posterior right lentiform nucleus/corona radiata. No associated hemorrhage or mass effect. 2. Generalized cerebral atrophy with moderate chronic small vessel ischemic disease and multiple remote infarcts as detailed above. 3. Inflammatory paranasal sinus disease as above. MRA HEAD IMPRESSION: 1. No large or proximal arterial branch occlusion within the intracranial circulation. 2. No high-grade or correctable stenosis within the intracranial circulation. 3. Multifocal atheromatous irregularity throughout the carotid siphons with mild to moderate multi focal narrowing. 4. Additional multi focal atheromatous irregularity throughout the intracranial circulation as detailed above, most evident within the distal MCA branches and PCAs bilaterally. Electronically Signed   By: Jeannine Boga M.D.   On: 03/21/2015 02:13   Mr Brain Wo Contrast  03/21/2015  CLINICAL DATA:  Initial evaluation for acute speech difficulty. EXAM: MRI HEAD WITHOUT CONTRAST MRA HEAD WITHOUT CONTRAST TECHNIQUE: Multiplanar, multiecho pulse sequences of the brain and surrounding structures were obtained without intravenous contrast. Angiographic images of the head were obtained using MRA technique without contrast. COMPARISON:  Prior CT from 03/20/2015. FINDINGS: MRI HEAD FINDINGS Diffuse prominence of the CSF containing spaces is compatible with generalized age-related cerebral atrophy. Patchy T2/FLAIR hyperintensity within the periventricular and deep white matter both cerebral hemispheres most consistent with chronic small vessel ischemic disease. This is moderate in nature. Few scattered remote lacunar infarcts  within the bilateral corona radiata. Small remote lacunar infarcts within the bilateral thalami and basal ganglia. Remote left cerebellar infarct present. Curvilinear focus of restricted diffusion measuring approximately 2 cm present within the posterior right lentiform nucleus extending into the right corona radiata, consistent with acute ischemic nonhemorrhagic infarct. No associated mass effect or hemorrhage. No other areas of infarction. Minimal patchy high signal on axial either overlie sequence within the anterior right corona radiata and left corpus callosum favored to be related to susceptibility artifact from remote lacunar infarcts within these regions. Abnormal flow void within the distal left vertebral artery, which may related to slow flow and/or atheromatous disease. Major intracranial vascular flow voids are otherwise maintained. No acute intracranial hemorrhage. No areas of chronic hemorrhage. No mass lesion, midline shift, or mass effect. No hydrocephalus. No extra-axial fluid collection. Major dural sinuses are patent. Craniocervical junction within normal limits. No acute abnormality within the upper cervical spine. Pituitary gland within normal limits. No acute abnormality about the orbits. Mucosal thickening throughout the ethmoidal air cells and maxillary sinuses. Right frontal sinus is largely opacified. No significant mastoid effusion. Inner ear structures grossly normal. Bone marrow signal intensity within normal limits. No scalp soft tissue abnormality. MRA HEAD FINDINGS ANTERIOR CIRCULATION: Visualized distal cervical segments of the internal carotid arteries are patent with antegrade flow. Petrous segments patent. Multi focal atheromatous irregularity throughout the cavernous/ supraclinoid ICAs with secondary mild to moderate multi focal narrowing, slightly worse on the left. A1 segments patent. Anterior communicating artery within normal limits. Anterior cerebral arteries opacified to  their distal aspects. M1 segments mildly irregular without high-grade stenosis or occlusion. MCA bifurcations within normal limits. No proximal M2 branch occlusion. Distal MCA branches opacified and fairly symmetric. Multifocal atheromatous irregularity within the distal MCA branches bilaterally. POSTERIOR CIRCULATION: Vertebral arteries patent to the vertebrobasilar junction. Right vertebral artery dominant. Posterior inferior cerebral arteries not well evaluated on this exam. Basilar artery tortuous with mild smooth narrowing without focal high-grade stenosis. Dominant right anterior inferior cerebral artery. Probable high-grade stenosis at the origin of the left superior cerebral artery. Superior cerebral arteries otherwise well opacified. Both posterior  cerebral arteries arise from the basilar artery and are well opacified to their distal aspects. Multifocal atheromatous irregularity with stenoses present within the PCAs, right slightly worse than left. No aneurysm or vascular malformation. IMPRESSION: MRI HEAD IMPRESSION: 1. Approximately 2 cm curvilinear acute ischemic infarct involving the posterior right lentiform nucleus/corona radiata. No associated hemorrhage or mass effect. 2. Generalized cerebral atrophy with moderate chronic small vessel ischemic disease and multiple remote infarcts as detailed above. 3. Inflammatory paranasal sinus disease as above. MRA HEAD IMPRESSION: 1. No large or proximal arterial branch occlusion within the intracranial circulation. 2. No high-grade or correctable stenosis within the intracranial circulation. 3. Multifocal atheromatous irregularity throughout the carotid siphons with mild to moderate multi focal narrowing. 4. Additional multi focal atheromatous irregularity throughout the intracranial circulation as detailed above, most evident within the distal MCA branches and PCAs bilaterally. Electronically Signed   By: Jeannine Boga M.D.   On: 03/21/2015 02:13     Scheduled Meds: . aspirin EC  325 mg Oral Daily  . enoxaparin (LOVENOX) injection  40 mg Subcutaneous Q0600  . folic acid  1 mg Oral Daily  . insulin aspart  0-9 Units Subcutaneous 6 times per day  . multivitamin with minerals  1 tablet Oral Daily  . thiamine  100 mg Oral Daily   Or  . thiamine  100 mg Intravenous Daily   Continuous Infusions:   Principal Problem:   Stroke [facility]) Active Problems:   HTN (hypertension)   DM (diabetes mellitus) ([place])   ETOH abuse   Cocaine abuse   Tobacco abuse   Polycythemia vera ([place])    Time spent: 25 min    [facility]  [facility] Pager [phone]*. If 7PM-7AM, please contact night-coverage at [email or web address], password [facility] 03/22/2015, 3:51 PM  LOS: 1 day

## 2015-03-22 NOTE — Progress Notes (Signed)
Hourly rounding performed. Call light within reach. Pt in no acute distress. Denies needs.   

## 2015-03-23 ENCOUNTER — Telehealth: Payer: Self-pay | Admitting: Oncology

## 2015-03-23 LAB — GLUCOSE, CAPILLARY
GLUCOSE-CAPILLARY: 104 mg/dL — AB (ref 65–99)
GLUCOSE-CAPILLARY: 185 mg/dL — AB (ref 65–99)
GLUCOSE-CAPILLARY: 207 mg/dL — AB (ref 65–99)
Glucose-Capillary: 179 mg/dL — ABNORMAL HIGH (ref 65–99)

## 2015-03-23 MED ORDER — THIAMINE HCL 100 MG PO TABS: 100.0000 mg | ORAL_TABLET | Freq: Every day | ORAL | Status: DC

## 2015-03-23 MED ORDER — ASPIRIN EC 81 MG PO TBEC: 81.0000 mg | DELAYED_RELEASE_TABLET | Freq: Every day | ORAL | Status: DC

## 2015-03-23 MED ORDER — PANTOPRAZOLE SODIUM 40 MG PO TBEC: 40.0000 mg | DELAYED_RELEASE_TABLET | Freq: Every day | ORAL | Status: DC

## 2015-03-23 MED ORDER — METFORMIN HCL 1000 MG PO TABS: 1000.0000 mg | ORAL_TABLET | Freq: Two times a day (BID) | ORAL | Status: DC

## 2015-03-23 MED ORDER — GABAPENTIN 300 MG PO CAPS: 600.0000 mg | ORAL_CAPSULE | Freq: Two times a day (BID) | ORAL | Status: DC

## 2015-03-23 MED ORDER — FOLIC ACID 1 MG PO TABS: 1.0000 mg | ORAL_TABLET | Freq: Every day | ORAL | Status: DC

## 2015-03-23 MED ORDER — EZETIMIBE 10 MG PO TABS: 10.0000 mg | ORAL_TABLET | Freq: Every day | ORAL | Status: DC

## 2015-03-23 MED ORDER — LISINOPRIL 10 MG PO TABS: 20.0000 mg | ORAL_TABLET | Freq: Every day | ORAL | Status: DC

## 2015-03-23 MED FILL — ?METFORMIN HCL 1,000 MG TAB: 1000 | 30 days supply | Qty: 60 | Fill #0

## 2015-03-23 MED FILL — ?PANTOPRAZOLE SOD DR 40MG: 40 MG | 30 days supply | Qty: 30 | Fill #0

## 2015-03-23 MED FILL — FOLIC ACID 1 MG TABLET: 1 | 30 days supply | Qty: 30 | Fill #0

## 2015-03-23 MED FILL — ?ZETIA 10 MG TABLET: 10 MG | 30 days supply | Qty: 30 | Fill #0

## 2015-03-23 MED FILL — ?LISINOPRIL 10 MG TABLET: 10 | 15 days supply | Qty: 30 | Fill #0

## 2015-03-23 MED FILL — GABAPENTIN 300 MG CAPSULE: 300 | 30 days supply | Qty: 120 | Fill #0

## 2015-03-23 NOTE — Discharge Summary (Signed)
Physician Discharge Summary  Eric Morton [id number] DOB: 08/26/1965 DOA: 03/20/2015  PCP: Lance Bosch, NP  Admit date: 03/20/2015 Discharge date: 03/23/2015  Time spent: 25* minutes  Recommendations for Outpatient Follow-up:  1. Follow-up oncology Dr. Alen Blew in 3 weeks 2. Follow-up neurology in 2 months   Discharge Diagnoses:  Principal Problem:   Stroke [facility]) Active Problems:   HTN (hypertension)   DM (diabetes mellitus) ([place])   ETOH abuse   Cocaine abuse   Tobacco abuse   Polycythemia vera ([place])   Cerebrovascular accident (CVA) due to occlusion of cerebral artery [facility])   Discharge Condition: Stable  Diet recommendation: Heart healthy diet  Filed Weights   03/21/15 1941  Weight: 75.524 kg (166 lb 8 oz)    History of present illness:  50 y.o. male with h/o polycythemia vera, HTN, DM, dosent take any of his meds or follow with oncology anymore, cocaine abuse, EtOH abuse, smoking. Patient presents to the ED with c/o difficulty speaking, slurred speech, and ultimately difficulty swallowing which has been constant since onset at about 1pm yesterday afternoon. He had numbness to left lip and hand yesterday which has resolved. He initially ignored symptoms but decided to come in after he had difficulty swallowing.  Hospital Course:  1. CVA- MRI brain showed 2 cm acute ischemic infarct involving the posterior right lentiform nucleus/corona radiata. Patient seen by neurology, medication change to aspirin 81 mg by mouth daily. Follow-up neurology in 2 months. Cardiac duplex showed no obvious evidence of hemodynamically significant internal carotid artery stenosis bilaterally. Echocardiogram showed no cardiac source of emboli. 2. 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 I called and discussed Dr. Alen Blew, who agrees to see the patient in 2-3 weeks as outpatient. No acute intervention required at this time. 3. Diabetes mellitus- continue  metformin 4. Hypertension- continue lisinopril 20 mg by mouth daily 5. Hyperlipidemia- patient started on Zetia 10 mg by mouth daily 6. Peripheral neuropathy- patient takes Neurontin at home, wants dose to increase to 600 mg twice a day. Will change the dose to 600 mg by mouth twice a day  Procedures:  2-D echo  Carotid duplex  Consultations:  Neurology  Discharge Exam: Filed Vitals:   03/23/15 0558 03/23/15 1000  BP: 140/93 178/99  Pulse: 65 76  Temp: 98.1 F (36.7 C) 98.4 F (36.9 C)  Resp: 18 18    General: Appears in no acute distress Cardiovascular: S1-S2 regular Respiratory: Clear to auscultation bilaterally  Discharge Instructions    Current Discharge Medication List    START taking these medications   Details  aspirin EC 81 MG tablet Take 1 tablet (81 mg total) by mouth daily. Qty: 30 tablet, Refills: 2    ezetimibe (ZETIA) 10 MG tablet Take 1 tablet (10 mg total) by mouth daily. Qty: 30 tablet, Refills: 2    folic acid (FOLVITE) 1 MG tablet Take 1 tablet (1 mg total) by mouth daily. Qty: 30 tablet, Refills: 1    thiamine 100 MG tablet Take 1 tablet (100 mg total) by mouth daily. Qty: 30 tablet, Refills: 2      CONTINUE these medications which have CHANGED   Details  gabapentin (NEURONTIN) 300 MG capsule Take 2 capsules (600 mg total) by mouth 2 (two) times daily. Qty: 120 capsule, Refills: 3   Associated Diagnoses: Diabetic polyneuropathy associated with type 2 diabetes mellitus (HCC)    lisinopril (PRINIVIL,ZESTRIL) 10 MG tablet Take 2 tablets (20 mg total) by mouth daily. Qty:  30 tablet, Refills: 3   Associated Diagnoses: Essential hypertension    metFORMIN (GLUCOPHAGE) 1000 MG tablet Take 1 tablet (1,000 mg total) by mouth 2 (two) times daily with a meal. Qty: 30 tablet, Refills: 3   Associated Diagnoses: Type 2 diabetes mellitus without complication (HCC)    pantoprazole (PROTONIX) 40 MG tablet Take 1 tablet (40 mg total) by mouth daily at  6 (six) AM. Qty: 30 tablet, Refills: 0       No Known Allergies Follow-up Information    Follow up with Lance Bosch, NP In 2 weeks.   Specialty:  Internal Medicine   Contact information:   [place] [address] [phone]       Follow up with [facility], MD In 3 weeks.   Specialty:  Oncology   Why:  Cancer center   Contact information:   [place] [place] [address] [phone]        The results of significant diagnostics from this hospitalization (including imaging, microbiology, ancillary and laboratory) are listed below for reference.    Significant Diagnostic Studies: Ct Head Wo Contrast  03/20/2015  CLINICAL DATA:  50 year old male with left facial droop since 1600 hours yesterday. Initial encounter. EXAM: CT HEAD WITHOUT CONTRAST TECHNIQUE: Contiguous axial images were obtained from the base of the skull through the vertex without intravenous contrast. COMPARISON:  None. FINDINGS: Chronic right lamina papyracea fracture with superimposed widespread paranasal sinus mucosal thickening and opacification, sparing the sphenoid sinuses. Tympanic cavities and mastoids are clear. No acute osseous abnormality identified. No acute orbit or scalp soft tissue findings. Calcified atherosclerosis at the skull base. Cerebral volume is normal. Multifocal right side deep white matter capsule hypodensity (series 2, image 19). Less pronounced left hemisphere patchy white matter hypodensity. Dystrophic basal ganglia calcifications. Chronic appearing small to medium size infarcts in the left cerebellum. Probable venous variant in the right ambient cisterns series 2, image 12. No asymmetric intracranial vascular hyperdensity identified. No cortically based acute infarct identified. No acute intracranial hemorrhage identified. No midline shift, mass effect, or evidence of intracranial mass lesion. No ventriculomegaly. IMPRESSION: 1. Age advanced chronic ischemic  disease, including age indeterminate right deep white matter capsule involvement. 2. Widespread paranasal sinus inflammation. Electronically Signed   By: Genevie Ann M.D.   On: 03/20/2015 16:12   Mr Angiogram Head Wo Contrast  03/21/2015  CLINICAL DATA:  Initial evaluation for acute speech difficulty. EXAM: MRI HEAD WITHOUT CONTRAST MRA HEAD WITHOUT CONTRAST TECHNIQUE: Multiplanar, multiecho pulse sequences of the brain and surrounding structures were obtained without intravenous contrast. Angiographic images of the head were obtained using MRA technique without contrast. COMPARISON:  Prior CT from 03/20/2015. FINDINGS: MRI HEAD FINDINGS Diffuse prominence of the CSF containing spaces is compatible with generalized age-related cerebral atrophy. Patchy T2/FLAIR hyperintensity within the periventricular and deep white matter both cerebral hemispheres most consistent with chronic small vessel ischemic disease. This is moderate in nature. Few scattered remote lacunar infarcts within the bilateral corona radiata. Small remote lacunar infarcts within the bilateral thalami and basal ganglia. Remote left cerebellar infarct present. Curvilinear focus of restricted diffusion measuring approximately 2 cm present within the posterior right lentiform nucleus extending into the right corona radiata, consistent with acute ischemic nonhemorrhagic infarct. No associated mass effect or hemorrhage. No other areas of infarction. Minimal patchy high signal on axial either overlie sequence within the anterior right corona radiata and left corpus callosum favored to be related to susceptibility artifact from remote lacunar  infarcts within these regions. Abnormal flow void within the distal left vertebral artery, which may related to slow flow and/or atheromatous disease. Major intracranial vascular flow voids are otherwise maintained. No acute intracranial hemorrhage. No areas of chronic hemorrhage. No mass lesion, midline shift, or mass  effect. No hydrocephalus. No extra-axial fluid collection. Major dural sinuses are patent. Craniocervical junction within normal limits. No acute abnormality within the upper cervical spine. Pituitary gland within normal limits. No acute abnormality about the orbits. Mucosal thickening throughout the ethmoidal air cells and maxillary sinuses. Right frontal sinus is largely opacified. No significant mastoid effusion. Inner ear structures grossly normal. Bone marrow signal intensity within normal limits. No scalp soft tissue abnormality. MRA HEAD FINDINGS ANTERIOR CIRCULATION: Visualized distal cervical segments of the internal carotid arteries are patent with antegrade flow. Petrous segments patent. Multi focal atheromatous irregularity throughout the cavernous/ supraclinoid ICAs with secondary mild to moderate multi focal narrowing, slightly worse on the left. A1 segments patent. Anterior communicating artery within normal limits. Anterior cerebral arteries opacified to their distal aspects. M1 segments mildly irregular without high-grade stenosis or occlusion. MCA bifurcations within normal limits. No proximal M2 branch occlusion. Distal MCA branches opacified and fairly symmetric. Multifocal atheromatous irregularity within the distal MCA branches bilaterally. POSTERIOR CIRCULATION: Vertebral arteries patent to the vertebrobasilar junction. Right vertebral artery dominant. Posterior inferior cerebral arteries not well evaluated on this exam. Basilar artery tortuous with mild smooth narrowing without focal high-grade stenosis. Dominant right anterior inferior cerebral artery. Probable high-grade stenosis at the origin of the left superior cerebral artery. Superior cerebral arteries otherwise well opacified. Both posterior cerebral arteries arise from the basilar artery and are well opacified to their distal aspects. Multifocal atheromatous irregularity with stenoses present within the PCAs, right slightly worse  than left. No aneurysm or vascular malformation. IMPRESSION: MRI HEAD IMPRESSION: 1. Approximately 2 cm curvilinear acute ischemic infarct involving the posterior right lentiform nucleus/corona radiata. No associated hemorrhage or mass effect. 2. Generalized cerebral atrophy with moderate chronic small vessel ischemic disease and multiple remote infarcts as detailed above. 3. Inflammatory paranasal sinus disease as above. MRA HEAD IMPRESSION: 1. No large or proximal arterial branch occlusion within the intracranial circulation. 2. No high-grade or correctable stenosis within the intracranial circulation. 3. Multifocal atheromatous irregularity throughout the carotid siphons with mild to moderate multi focal narrowing. 4. Additional multi focal atheromatous irregularity throughout the intracranial circulation as detailed above, most evident within the distal MCA branches and PCAs bilaterally. Electronically Signed   By: Jeannine Boga M.D.   On: 03/21/2015 02:13   Mr Brain Wo Contrast  03/21/2015  CLINICAL DATA:  Initial evaluation for acute speech difficulty. EXAM: MRI HEAD WITHOUT CONTRAST MRA HEAD WITHOUT CONTRAST TECHNIQUE: Multiplanar, multiecho pulse sequences of the brain and surrounding structures were obtained without intravenous contrast. Angiographic images of the head were obtained using MRA technique without contrast. COMPARISON:  Prior CT from 03/20/2015. FINDINGS: MRI HEAD FINDINGS Diffuse prominence of the CSF containing spaces is compatible with generalized age-related cerebral atrophy. Patchy T2/FLAIR hyperintensity within the periventricular and deep white matter both cerebral hemispheres most consistent with chronic small vessel ischemic disease. This is moderate in nature. Few scattered remote lacunar infarcts within the bilateral corona radiata. Small remote lacunar infarcts within the bilateral thalami and basal ganglia. Remote left cerebellar infarct present. Curvilinear focus of  restricted diffusion measuring approximately 2 cm present within the posterior right lentiform nucleus extending into the right corona radiata, consistent with acute ischemic nonhemorrhagic infarct. No associated  mass effect or hemorrhage. No other areas of infarction. Minimal patchy high signal on axial either overlie sequence within the anterior right corona radiata and left corpus callosum favored to be related to susceptibility artifact from remote lacunar infarcts within these regions. Abnormal flow void within the distal left vertebral artery, which may related to slow flow and/or atheromatous disease. Major intracranial vascular flow voids are otherwise maintained. No acute intracranial hemorrhage. No areas of chronic hemorrhage. No mass lesion, midline shift, or mass effect. No hydrocephalus. No extra-axial fluid collection. Major dural sinuses are patent. Craniocervical junction within normal limits. No acute abnormality within the upper cervical spine. Pituitary gland within normal limits. No acute abnormality about the orbits. Mucosal thickening throughout the ethmoidal air cells and maxillary sinuses. Right frontal sinus is largely opacified. No significant mastoid effusion. Inner ear structures grossly normal. Bone marrow signal intensity within normal limits. No scalp soft tissue abnormality. MRA HEAD FINDINGS ANTERIOR CIRCULATION: Visualized distal cervical segments of the internal carotid arteries are patent with antegrade flow. Petrous segments patent. Multi focal atheromatous irregularity throughout the cavernous/ supraclinoid ICAs with secondary mild to moderate multi focal narrowing, slightly worse on the left. A1 segments patent. Anterior communicating artery within normal limits. Anterior cerebral arteries opacified to their distal aspects. M1 segments mildly irregular without high-grade stenosis or occlusion. MCA bifurcations within normal limits. No proximal M2 branch occlusion. Distal MCA  branches opacified and fairly symmetric. Multifocal atheromatous irregularity within the distal MCA branches bilaterally. POSTERIOR CIRCULATION: Vertebral arteries patent to the vertebrobasilar junction. Right vertebral artery dominant. Posterior inferior cerebral arteries not well evaluated on this exam. Basilar artery tortuous with mild smooth narrowing without focal high-grade stenosis. Dominant right anterior inferior cerebral artery. Probable high-grade stenosis at the origin of the left superior cerebral artery. Superior cerebral arteries otherwise well opacified. Both posterior cerebral arteries arise from the basilar artery and are well opacified to their distal aspects. Multifocal atheromatous irregularity with stenoses present within the PCAs, right slightly worse than left. No aneurysm or vascular malformation. IMPRESSION: MRI HEAD IMPRESSION: 1. Approximately 2 cm curvilinear acute ischemic infarct involving the posterior right lentiform nucleus/corona radiata. No associated hemorrhage or mass effect. 2. Generalized cerebral atrophy with moderate chronic small vessel ischemic disease and multiple remote infarcts as detailed above. 3. Inflammatory paranasal sinus disease as above. MRA HEAD IMPRESSION: 1. No large or proximal arterial branch occlusion within the intracranial circulation. 2. No high-grade or correctable stenosis within the intracranial circulation. 3. Multifocal atheromatous irregularity throughout the carotid siphons with mild to moderate multi focal narrowing. 4. Additional multi focal atheromatous irregularity throughout the intracranial circulation as detailed above, most evident within the distal MCA branches and PCAs bilaterally. Electronically Signed   By: Jeannine Boga M.D.   On: 03/21/2015 02:13       Labs: Basic Metabolic Panel:  Recent Labs Lab 03/20/15 1536  NA 135  K 4.6  CL 101  CO2 24  GLUCOSE 188*  BUN 6  CREATININE 1.04  CALCIUM 9.0   Liver  Function Tests:  Recent Labs Lab 03/20/15 1536  AST 26  ALT 19  ALKPHOS 88  BILITOT 0.9  PROT 6.3*  ALBUMIN 2.9*   CBC:  Recent Labs Lab 03/20/15 1536  WBC 4.3  NEUTROABS 1.3*  HGB 20.6*  HCT 61.0*  MCV 91.7  PLT 98*    CBG:  Recent Labs Lab 03/22/15 2017 03/23/15 0006 03/23/15 0412 03/23/15 0749 03/23/15 1154  GLUCAP 335* 104* 185* 179* 207*  SignedOswald Hillock MD.  [facility] 03/23/2015, 1:06 PM

## 2015-03-23 NOTE — Progress Notes (Signed)
Nutrition Education Note  RD consulted for nutrition education regarding a Heart Healthy diet.   Lipid Panel     Component Value Date/Time   CHOL 219* 03/21/2015 0339   TRIG 175* 03/21/2015 0339   HDL 64 03/21/2015 0339   CHOLHDL 3.4 03/21/2015 0339   VLDL 35 03/21/2015 0339   LDLCALC 120* 03/21/2015 0339    RD provided "Cardiac Nutrition Therapy" handout from the Academy of Nutrition and Dietetics. Reviewed patient's dietary recall. Provided examples on ways to decrease sodium and fat intake in diet. Discouraged intake of processed foods and use of salt shaker. Encouraged fresh fruits and vegetables as well as whole grain sources of carbohydrates to maximize fiber intake. Encouraged decreasing intake of fried foods and processed meats. Teach back method used. Pt states that he is no longer going to add any salt to his food and he will use less oil when cooking. RD also provided "Eating Right with Less Salt", "20 Ways to Eat More Fruits and Vegetables" and "Healthy Snacks for Adults and Teens" handouts from the Academy of Nutrition and Dietetics.   Expect good compliance.  Body mass index is 26.07 kg/(m^2). Pt meets criteria for Overweight based on current BMI.  Current diet order is Heart Healthy/Carb Modified, patient is consuming approximately 100% of meals at this time. Labs and medications reviewed. No further nutrition interventions warranted at this time. RD contact information provided. If additional nutrition issues arise, please re-consult RD.  Scarlette Ar RD, LDN Inpatient Clinical Dietitian Pager: [phone] After Hours Pager: [phone]

## 2015-03-23 NOTE — Care Management Note (Signed)
Case Management Note  Patient Details  Name: Eric Morton MRN: [id number] Date of Birth: 09-Feb-1965  Subjective/Objective:                    Action/Plan: Patient discharging home today with self care. Patient already active with [facility]. No f/u per PT/OT. No further needs per CM.   Expected Discharge Date:                  Expected Discharge Plan:  Home/Self Care  In-House Referral:     Discharge planning Services     Post Acute Care Choice:    Choice offered to:     DME Arranged:    DME Agency:     HH Arranged:    [place] Agency:     Status of Service:  Completed, signed off  Medicare Important Message Given:    Date Medicare IM Given:    Medicare IM give by:    Date Additional Medicare IM Given:    Additional Medicare Important Message give by:     If discussed at [place] of Stay Meetings, dates discussed:    Additional Comments:  Pollie Friar, RN 03/23/2015, 1:52 PM

## 2015-03-23 NOTE — Progress Notes (Signed)
The stroke team will sign off at this time. Please call if we can be of further service. Neurology follow-up with Dr. Jaynee Eagles in one month. Continue aspirin 81 mg daily and Zetia.  Mikey Bussing PA-C [facility] Pager [phone] 03/23/2015, 1:18 PM

## 2015-03-23 NOTE — Telephone Encounter (Signed)
S/w nurse secretary and gave appt for 03/17 @ 11 w/Dr. Alen Blew

## 2015-03-23 NOTE — Progress Notes (Signed)
PT Cancellation Note  Patient Details Name: Eric Morton MRN: [id number] DOB: 1965/05/21   Cancelled Treatment:    Reason Eval/Treat Not Completed: PT screened, no needs identified, will sign off.  Discussed pt case with OT who states that pt is independent with mobility at this time. If needs change, please reconsult.   Rolinda Roan 03/23/2015, 12:33 PM   Rolinda Roan, PT, DPT Acute Rehabilitation Services Pager: [phone]

## 2015-03-23 NOTE — Evaluation (Signed)
Occupational Therapy Evaluation and Discharge Patient Details Name: Eric Morton MRN: [id number] DOB: Nov 25, 1965 Today's Date: 03/23/2015    History of Present Illness Pt admitted with impaired speech and difficulty swallowing, L hand numbness. Pt with R lenticular nucleus/corona radiata infarct. PMH: DM, HTN, substance abuse, polycythemia vera.   Clinical Impression   Pt is functioning independently in mobility and ADL. No further OT needs.  Educated pt in s/s of stroke and to immediately seek medical care.    Follow Up Recommendations  No OT follow up    Equipment Recommendations  None recommended by OT    Recommendations for Other Services       Precautions / Restrictions Precautions Precautions: None Restrictions Weight Bearing Restrictions: No      Mobility Bed Mobility Overal bed mobility: Independent                Transfers Overall transfer level: Independent                    Balance                                            ADL Overall ADL's : Independent                                             Vision     Perception     Praxis      Pertinent Vitals/Pain Pain Assessment: No/denies pain     Hand Dominance Right   Extremity/Trunk Assessment Upper Extremity Assessment Upper Extremity Assessment: Overall WFL for tasks assessed   Lower Extremity Assessment Lower Extremity Assessment: Overall WFL for tasks assessed   Cervical / Trunk Assessment Cervical / Trunk Assessment: Normal   Communication Communication Communication: Expressive difficulties   Cognition Arousal/Alertness: Awake/alert Behavior During Therapy: WFL for tasks assessed/performed Overall Cognitive Status: Within Functional Limits for tasks assessed                     General Comments       Exercises       Shoulder Instructions      Home Living Family/patient expects to be discharged to:: Private  residence Living Arrangements: Alone Available Help at Discharge: Family;Available PRN/intermittently (lives behind aunt and uncle) Type of Home: Apartment Home Access: Level entry     Home Layout: One level     Bathroom Shower/Tub: Teacher, early years/pre: Standard     Home Equipment: None          Prior Functioning/Environment Level of Independence: Independent             OT Diagnosis: Generalized weakness (impaired speech, L hand weakness)   OT Problem List:     OT Treatment/Interventions:      OT Goals(Current goals can be found in the care plan section) Acute Rehab OT Goals Patient Stated Goal: not have another stroke  OT Frequency:     Barriers to D/C:            Co-evaluation              End of Session    Activity Tolerance: Patient tolerated treatment well Patient left: in chair;with call bell/phone within reach;with family/visitor present  Time: [id number] OT Time Calculation (min): 23 min Charges:  OT General Charges $OT Visit: 1 Procedure OT Evaluation $OT Eval Low Complexity: 1 Procedure OT Treatments $Self Care/Home Management : 8-22 mins G-Codes:    Malka So 03/23/2015, 12:10 PM  [phone]

## 2015-03-26 ENCOUNTER — Telehealth: Payer: Self-pay | Admitting: Oncology

## 2015-03-26 ENCOUNTER — Telehealth: Payer: Self-pay | Admitting: Neurology

## 2015-03-26 NOTE — Telephone Encounter (Signed)
S/w patient and gave np appt for 03/17 @ 11 w/Dr. Alen Blew

## 2015-03-26 NOTE — Telephone Encounter (Signed)
Pt called to schedule his 1 mo f/u from the hospital with Dr. Jaynee Eagles, pt stated that he was given 7 precriptions but on his paper work it states that it is 8 precriptions and he wants [facility] make sure he is not missing any. Please call pt back ASAP!

## 2015-03-28 ENCOUNTER — Other Ambulatory Visit: Payer: Self-pay

## 2015-03-28 NOTE — Patient Outreach (Signed)
[facility]) Care Management  03/28/2015  Eric Morton 1965/04/25 [id number]   EMMI Stroke RED ALERT Day #1  Date: 03/27/15 Red Alert Reason: "been able to take every dose of meds? I don't know"  Outreach attempt call to patient #1. Spoke with patient's aunt who requested that RN CM call patient on his cell at [phone]. Patient reached. He states that he is doing well. He does have some noticeable speech impediment. He reports that he still has some weakness/numbness to left hand esp. His pinky finger but otherwise is doing okay. He denies any DME usage. He has f/u appt with PCP on 04/02/15 and neurologist on 04/23/15. He voices that he is able to get to appointments at this time. Discussed EMMI red alert with patient. He reports that he is not sure why his aunt answered "I don't know." Patient able to verbalize that he is taking eight medications. He self administers meds directly from pill box. He denies any issues with affording meds. He reports that he has a meeting today to get assistance with completing Medicaid application as it is currently pending. RN CM reviewed with patient s/s of stroke and when to seek medical attention. Patient agreed to weekly calls by RN CM for four weeks for EMMI stroke program.   Plan: RN CM will notify [facility] administrative assistant of case status. RN CM will send educational materials to patient in regards to stroke. RN CM will contact patient back within a week. RN CM confirmed that patient is aware of when to seek medical attention for changes in condition.   Eric Montgomery, RN,BSN,CCM [place] [facility] Telephonic Care Management Coordinator Direct Phone: [phone] Toll Free: [phone] Fax: [phone]    Plan: RN [place] will  Eric Montgomery, RN,BSN,CCM [place] [facility] Telephonic Care Management Coordinator Direct Phone: [phone] Toll Free: [phone] Fax: [phone]

## 2015-04-02 ENCOUNTER — Ambulatory Visit: Payer: Self-pay | Attending: Internal Medicine | Admitting: Internal Medicine

## 2015-04-02 ENCOUNTER — Encounter: Payer: Self-pay | Admitting: Internal Medicine

## 2015-04-02 VITALS — BP 111/78 | HR 80 | Temp 98.0°F | Resp 16 | Ht 67.0 in | Wt 169.0 lb

## 2015-04-02 DIAGNOSIS — F17210 Nicotine dependence, cigarettes, uncomplicated: Secondary | ICD-10-CM | POA: Insufficient documentation

## 2015-04-02 DIAGNOSIS — Z86010 Personal history of colonic polyps: Secondary | ICD-10-CM | POA: Insufficient documentation

## 2015-04-02 DIAGNOSIS — E7800 Pure hypercholesterolemia, unspecified: Secondary | ICD-10-CM | POA: Insufficient documentation

## 2015-04-02 DIAGNOSIS — G629 Polyneuropathy, unspecified: Secondary | ICD-10-CM | POA: Insufficient documentation

## 2015-04-02 DIAGNOSIS — Z8673 Personal history of transient ischemic attack (TIA), and cerebral infarction without residual deficits: Secondary | ICD-10-CM | POA: Insufficient documentation

## 2015-04-02 DIAGNOSIS — I6350 Cerebral infarction due to unspecified occlusion or stenosis of unspecified cerebral artery: Secondary | ICD-10-CM

## 2015-04-02 DIAGNOSIS — E119 Type 2 diabetes mellitus without complications: Secondary | ICD-10-CM | POA: Insufficient documentation

## 2015-04-02 DIAGNOSIS — I129 Hypertensive chronic kidney disease with stage 1 through stage 4 chronic kidney disease, or unspecified chronic kidney disease: Secondary | ICD-10-CM | POA: Insufficient documentation

## 2015-04-02 DIAGNOSIS — Z8719 Personal history of other diseases of the digestive system: Secondary | ICD-10-CM | POA: Insufficient documentation

## 2015-04-02 DIAGNOSIS — Z79899 Other long term (current) drug therapy: Secondary | ICD-10-CM | POA: Insufficient documentation

## 2015-04-02 DIAGNOSIS — Z720 Tobacco use: Secondary | ICD-10-CM

## 2015-04-02 DIAGNOSIS — F1410 Cocaine abuse, uncomplicated: Secondary | ICD-10-CM | POA: Insufficient documentation

## 2015-04-02 DIAGNOSIS — I10 Essential (primary) hypertension: Secondary | ICD-10-CM | POA: Insufficient documentation

## 2015-04-02 DIAGNOSIS — Z7984 Long term (current) use of oral hypoglycemic drugs: Secondary | ICD-10-CM | POA: Insufficient documentation

## 2015-04-02 DIAGNOSIS — Z7982 Long term (current) use of aspirin: Secondary | ICD-10-CM | POA: Insufficient documentation

## 2015-04-02 LAB — POCT GLYCOSYLATED HEMOGLOBIN (HGB A1C): HEMOGLOBIN A1C: 8.1

## 2015-04-02 LAB — GLUCOSE, POCT (MANUAL RESULT ENTRY): POC GLUCOSE: 136 mg/dL — AB (ref 70–99)

## 2015-04-02 NOTE — Progress Notes (Signed)
Patient here for follow up form the ED Patient was seen for stroke symptoms Patient also here for follow up on his diabetes

## 2015-04-02 NOTE — Progress Notes (Signed)
Patient ID: Eric Morton, male   DOB: Jan 09, 1966, 50 y.o.   MRN: [id number]  CC: HFU  HPI: Eric Morton is a 50 y.o. male here today for a follow up visit.  Patient has past medical history of HTN, CVA, diabetes, polycythemia, tobacco use, ETOH use, and cocaine use. Patient was admitted into hospital on 2/21-2/24 for a stroke. At that time patient was found to be off all medications for almost one year. He tested positive for cocaine and marijuana at that time. Before discharged patient was placed on aspirin and Zetia. He states that he has not drank alcohol in two weeks but admits to smoking 4 cigarettes per day and using cocaine last night. He states that he has taken every medication given to him since discharged. Has appointment on 3/17 for phlebotomy due to his polycythemia that he was told he has due to smoking. He reports that he has had this completed several times in the past before he stopped seeing his oncologist.   No Known Allergies Past Medical History  Diagnosis Date  . Hypertension   . Substance abuse   . Peripheral neuropathy ([place])   . Hypercholesterolemia   . Type II diabetes mellitus ([place])   . 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 Chronic kidney disease     "jsut a little damage"  . Anxiety   . History of stomach ulcers     not confirmed, never underwent diagnostic endo or radiology, this manifested as epigastric distress.   . Polycythemia vera ([place]) 2006  . Hx of adenomatous colonic polyps    Current Outpatient Prescriptions on File Prior to Visit  Medication Sig Dispense Refill  . aspirin EC 81 MG tablet Take 1 tablet (81 mg total) by mouth daily. 30 tablet 2  . ezetimibe (ZETIA) 10 MG tablet Take 1 tablet (10 mg total) by mouth daily. 30 tablet 2  . folic acid (FOLVITE) 1 MG tablet Take 1 tablet (1 mg total) by mouth daily. 30 tablet 1  . gabapentin (NEURONTIN) 300 MG capsule Take 2 capsules (600 mg total) by mouth 2 (two) times daily. 120 capsule  3  . lisinopril (PRINIVIL,ZESTRIL) 10 MG tablet Take 2 tablets (20 mg total) by mouth daily. 30 tablet 3  . metFORMIN (GLUCOPHAGE) 1000 MG tablet Take 1 tablet (1,000 mg total) by mouth 2 (two) times daily with a meal. 60 tablet 3  . pantoprazole (PROTONIX) 40 MG tablet Take 1 tablet (40 mg total) by mouth daily at 6 (six) AM. 30 tablet 0  . thiamine 100 MG tablet Take 1 tablet (100 mg total) by mouth daily. 30 tablet 2   No current facility-administered medications on file prior to visit.   Family History  Problem Relation Age of Onset  . Diabetes Mother   . Hypertension Mother    Social History   Social History  . Marital Status: Widowed    Spouse Name: N/A  . Number of Children: N/A  . Years of Education: N/A   Occupational History  . Not on file.   Social History Main Topics  . Smoking status: Current Some Day Smoker -- 0.50 packs/day for 32 years    Types: Cigarettes  . Smokeless tobacco: Never Used  . Alcohol Use: 42.0 oz/week    70 Cans of beer per week     Comment: 3/14 quit drinking 5 days ago and then drank last nite  . Drug Use: Yes  Special: Marijuana     Comment: snorts and smokes cocaine; 09/25/2011 last time  . Sexual Activity: Yes   Other Topics Concern  . Not on file   Social History Narrative    Review of Systems: Constitutional: Negative for fever, chills, diaphoresis, activity change, appetite change and fatigue. HENT: Negative for ear pain, nosebleeds, congestion, facial swelling, rhinorrhea, neck pain, neck stiffness and ear discharge.  Eyes: Negative for pain, discharge, redness, itching and visual disturbance. Respiratory: Negative for cough, choking, chest tightness, shortness of breath, wheezing and stridor.  Cardiovascular: Negative for chest pain, palpitations and leg swelling. Gastrointestinal: Negative for abdominal distention. Genitourinary: Negative for dysuria, urgency, frequency, hematuria, flank pain, decreased urine volume,  difficulty urinating and dyspareunia.  Musculoskeletal: Negative for back pain, joint swelling, arthralgias and gait problem. Neurological: Negative for dizziness, tremors, seizures, syncope, facial asymmetry, speech difficulty, weakness, light-headedness, numbness and headaches.  Hematological: Negative for adenopathy. Does not bruise/bleed easily. Psychiatric/Behavioral: Negative for hallucinations, behavioral problems, confusion, dysphoric mood, decreased concentration and agitation.    Objective:   Filed Vitals:   04/02/15 1547  BP: 111/78  Pulse: 80  Temp: 98 F (36.7 C)  Resp: 16    Physical Exam  Cardiovascular: Normal rate, regular rhythm and normal heart sounds.   Pulmonary/Chest: Effort normal and breath sounds normal.  Musculoskeletal: Normal range of motion. He exhibits no edema.  Neurological:  Left sided facial droop  Skin: Skin is warm and dry.  Psychiatric:  Patient appears intoxicated.     Lab Results  Component Value Date   WBC 4.3 03/20/2015   HGB 20.6* 03/20/2015   HCT 61.0* 03/20/2015   MCV 91.7 03/20/2015   PLT 98* 03/20/2015   Lab Results  Component Value Date   CREATININE 1.04 03/20/2015   BUN 6 03/20/2015   NA 135 03/20/2015   K 4.6 03/20/2015   CL 101 03/20/2015   CO2 24 03/20/2015    Lab Results  Component Value Date   HGBA1C 8.2* 03/21/2015   Lipid Panel     Component Value Date/Time   CHOL 219* 03/21/2015 0339   TRIG 175* 03/21/2015 0339   HDL 64 03/21/2015 0339   CHOLHDL 3.4 03/21/2015 0339   VLDL 35 03/21/2015 0339   LDLCALC 120* 03/21/2015 0339       Assessment and plan:   Eric Morton was seen today for follow-up.  Diagnoses and all orders for this visit:  Type 2 diabetes mellitus without complication, without long-term current use of insulin (HCC) -     Glucose (CBG) -     HgB A1c Patient's last A1C was 8.2% but he has been off all medications for almost one year. He will start back on Metformin. Stressed importance  of medication adherence and how his co-morbidities place him at higher risk for stroke and heart disease. Diet, exercise discussed.   Essential hypertension Patient blood pressure is stable and may continue on current medication---Lisinopril.  Education on diet, exercise, and modifiable risk factors discussed. Will obtain appropriate labs as needed. Will follow up in 3-6 months.   Cerebrovascular accident (CVA) due to occlusion of cerebral artery ([place]) Went over risk of repeat even with co-morbidities. Stressed smoking and drug cessation. He will continue Zetia and aspirin.   Cocaine abuse Offered patient resources for drug abuse counseling and programs---he refused several times. It does not appear that patient is ready to quit at this time but I did explain all risk to patient.   Tobacco abuse Smoking cessation discussed  for 3 minutes, patient is not willing to quit at this time. Will continue to assess on each visit. Discussed increased risk for diseases such as cancer, heart disease, and stroke.  I also explained how smoking affects polycythemia and stressed that he considers stopping now. Patient will try nicotine patches.   Return in about 3 months (around 07/03/2015) for DM/HTN.      Lance Bosch, [facility] [facility] [phone] 04/02/2015, 3:56 PM

## 2015-04-04 ENCOUNTER — Other Ambulatory Visit: Payer: Self-pay

## 2015-04-04 NOTE — Patient Outreach (Signed)
[facility]) Care Management  04/04/2015  Eric Morton 1965-03-19 [id number]   EMMI-Stroke Week #2   Telephone Assessment  Outreach call to patient. Spoke with patient. He was asleep but states he is doing well. Denies any new issues or concerns. He states he saw MD earlier this week. Patient reports visit went well. He states his BP was doing good. He was started on med to help manage his diabetes. He denies any issues with his medications. Stroke prevention reviewed with patient. RN CM also reviewed with patient s/s of stroke and when to seek medical attention. Patient voiced understanding.    Plan: RN CM will make outreach call to patient within a week.  Enzo Montgomery, RN,BSN,CCM [place] [facility] Telephonic Care Management Coordinator Direct Phone: [phone] Toll Free: [phone] Fax: [phone]'

## 2015-04-09 MED FILL — ?LISINOPRIL 10 MG TABLET: 10 | 15 days supply | Qty: 30 | Fill #1

## 2015-04-11 ENCOUNTER — Other Ambulatory Visit: Payer: Self-pay

## 2015-04-11 NOTE — Patient Outreach (Signed)
[name]) Care Management  04/11/2015  Eric Morton 11-24-1965 [id number]   EMMI Stroke Week #3   Outreach attempt to patient. Spoke with patient. He states that he is doing well. Denies any new symptoms or changes in condition. He states that he has an appt this wee for blood work to follow up on polycythemia. He states that he does not go back to PCP until June. Patient reports that numbness in his left hand/fingers has improved some. Discussed and reviewed meds with patient. He reports no issues. He states that his aunt checks his blood sugars and they normally range form 125-135.   Plan: RN CM will make outreach call to patient within a week.   Enzo Montgomery, RN,BSN,CCM [place] [facility] Telephonic Care Management Coordinator Direct Phone: [phone] Toll Free: [phone] Fax: [phone]

## 2015-04-13 ENCOUNTER — Ambulatory Visit (HOSPITAL_BASED_OUTPATIENT_CLINIC_OR_DEPARTMENT_OTHER): Payer: Self-pay

## 2015-04-13 ENCOUNTER — Ambulatory Visit (HOSPITAL_BASED_OUTPATIENT_CLINIC_OR_DEPARTMENT_OTHER): Payer: Self-pay | Admitting: Oncology

## 2015-04-13 VITALS — BP 139/78 | HR 84 | Temp 98.1°F | Resp 18 | Ht 67.0 in | Wt 172.9 lb

## 2015-04-13 DIAGNOSIS — D751 Secondary polycythemia: Secondary | ICD-10-CM

## 2015-04-13 DIAGNOSIS — D45 Polycythemia vera: Secondary | ICD-10-CM

## 2015-04-13 DIAGNOSIS — F1021 Alcohol dependence, in remission: Secondary | ICD-10-CM

## 2015-04-13 DIAGNOSIS — E785 Hyperlipidemia, unspecified: Secondary | ICD-10-CM

## 2015-04-13 DIAGNOSIS — I10 Essential (primary) hypertension: Secondary | ICD-10-CM

## 2015-04-13 LAB — CBC WITH DIFFERENTIAL/PLATELET
BASO%: 0.5 % (ref 0.0–2.0)
BASOS ABS: 0 10*3/uL (ref 0.0–0.1)
EOS ABS: 1.2 10*3/uL — AB (ref 0.0–0.5)
EOS%: 19.6 % — ABNORMAL HIGH (ref 0.0–7.0)
HEMATOCRIT: 52.7 % — AB (ref 38.4–49.9)
HGB: 17.6 g/dL — ABNORMAL HIGH (ref 13.0–17.1)
LYMPH#: 2.5 10*3/uL (ref 0.9–3.3)
LYMPH%: 41.1 % (ref 14.0–49.0)
MCH: 30.4 pg (ref 27.2–33.4)
MCHC: 33.4 g/dL (ref 32.0–36.0)
MCV: 91 fL (ref 79.3–98.0)
MONO#: 0.4 10*3/uL (ref 0.1–0.9)
MONO%: 7.4 % (ref 0.0–14.0)
NEUT#: 1.9 10*3/uL (ref 1.5–6.5)
NEUT%: 31.4 % — AB (ref 39.0–75.0)
PLATELETS: 130 10*3/uL — AB (ref 140–400)
RBC: 5.79 10*6/uL (ref 4.20–5.82)
RDW: 12.5 % (ref 11.0–14.6)
WBC: 6 10*3/uL (ref 4.0–10.3)

## 2015-04-13 NOTE — Progress Notes (Signed)
Phlebotomy performed using 18g angiocath/IV tubing via right forearm.  500gm blood removed over approximately 10 minutes.  Pt tolerated well.  Pt observed 30 min post procedure.  Food and drink provided.  VSS.

## 2015-04-13 NOTE — Patient Instructions (Signed)

## 2015-04-13 NOTE — Consult Note (Signed)
Reason for Referral: Polycythemia.   HPI: 50 year old gentleman currently of [place] where he currently resides. He has a history of polysubstance abuse including smoking and alcohol abuse. He also has history of hypertension and hyperlipidemia. He has been diagnosed with a polycythemia in the past although the etiology was unclear whether to polycythemia vera or secondary. He have received a phlebotomies under the care of Dr. Marin Olp around 2007. He establish care with Dr. Lamonte Sakai and his hemoglobin have stabilized around 15 and did not require any further phlebotomies. He has not been seen here since 2009 and have not received any phlebotomy. He was hospitalized recently on 03/20/2015 after presenting with difficulty speaking and numbness in his finger. 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 His workup did not reveal any carotid disease and cardiac disease ruled out embolic phenomenon. He was started on aspirin and was discharged subsequently. During his hospitalization his hemoglobin was noted to be 20 with a hemoglobin of 4.3 and platelet count of 98. His last CBC in April 2016 showed a hemoglobin of 14.5 and no other abnormalities. His workup in the past included a erythropoietin evaluation which was elevated around 41. He also had a JAK 2 mutation which was negative at that time. He denied any chest pain or angina. He denied any migraines.  Clinically, he is feeling fairly well at this time and his speech is improving. He does not report any residual neurological deficits at this time. He has not reported any recent weakness or numbness. His appetite have been excellent and performance status is at baseline. He denies any headaches, blurry vision, syncope or seizures. He does not report any fevers, chills or sweats. Does not report any cough, wheezing or hemoptysis. Does not report any nausea, vomiting or abdominal pain. Does not report any frequency urgency or hesitancy.  Does not report any skeletal complaints. Remaining review of systems unremarkable.   Past Medical History  Diagnosis Date  . Hypertension   . Substance abuse   . Peripheral neuropathy ([place])   . Hypercholesterolemia   . Type II diabetes mellitus ([employer])   . 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 Chronic kidney disease     "jsut a little damage"  . Anxiety   . History of stomach ulcers     not confirmed, never underwent diagnostic endo or radiology, this manifested as epigastric distress.   . Polycythemia vera ([place]) 2006  . Hx of adenomatous colonic polyps   :  Past Surgical History  Procedure Laterality Date  . Irrigation and debridement abscess  09/26/2011    Procedure: IRRIGATION AND DEBRIDEMENT ABSCESS;  Surgeon: Imogene Burn. Georgette Dover, MD;  Location: [facility];  Service: General;  Laterality: Left;  Incision and Drainage left breast abscess  . Colonoscopy with propofol N/A 04/28/2014    Procedure: COLONOSCOPY WITH PROPOFOL;  Surgeon: Gatha Mayer, MD;  Location: [facility];  Service: Endoscopy;  Laterality: N/A;  :   Current outpatient prescriptions:  .  aspirin EC 81 MG tablet, Take 1 tablet (81 mg total) by mouth daily., Disp: 30 tablet, Rfl: 2 .  ezetimibe (ZETIA) 10 MG tablet, Take 1 tablet (10 mg total) by mouth daily., Disp: 30 tablet, Rfl: 2 .  folic acid (FOLVITE) 1 MG tablet, Take 1 tablet (1 mg total) by mouth daily., Disp: 30 tablet, Rfl: 1 .  gabapentin (NEURONTIN) 300 MG capsule, Take 2 capsules (600 mg total)  by mouth 2 (two) times daily., Disp: 120 capsule, Rfl: 3 .  lisinopril (PRINIVIL,ZESTRIL) 10 MG tablet, Take 2 tablets (20 mg total) by mouth daily., Disp: 30 tablet, Rfl: 3 .  metFORMIN (GLUCOPHAGE) 1000 MG tablet, Take 1 tablet (1,000 mg total) by mouth 2 (two) times daily with a meal., Disp: 60 tablet, Rfl: 3 .  pantoprazole (PROTONIX) 40 MG tablet, Take 1 tablet (40 mg total) by mouth daily at 6 (six) AM., Disp: 30 tablet, Rfl: 0 .  thiamine 100  MG tablet, Take 1 tablet (100 mg total) by mouth daily., Disp: 30 tablet, Rfl: 2:  No Known Allergies:  Family History  Problem Relation Age of Onset  . Diabetes Mother   . Hypertension Mother   :  Social History   Social History  . Marital Status: Widowed    Spouse Name: N/A  . Number of Children: N/A  . Years of Education: N/A   Occupational History  . Not on file.   Social History Main Topics  . Smoking status: Current Some Day Smoker -- 0.50 packs/day for 32 years    Types: Cigarettes  . Smokeless tobacco: Never Used  . Alcohol Use: 42.0 oz/week    70 Cans of beer per week     Comment: 3/14 quit drinking 5 days ago and then drank last nite  . Drug Use: Yes    Special: Marijuana     Comment: snorts and smokes cocaine; 09/25/2011 last time  . Sexual Activity: Yes   Other Topics Concern  . Not on file   Social History Narrative  :  Pertinent items are noted in HPI.  Exam: Blood pressure 139/78, pulse 84, temperature 98.1 F (36.7 C), temperature source Oral, resp. rate 18, height 5\' 7"  (1.702 m), weight 172 lb 14.4 oz (78.427 kg), SpO2 100 %. General appearance: alert and cooperative Nose: Nares normal. Septum midline. Mucosa normal. No drainage or sinus tenderness. Throat: lips, mucosa, and tongue normal; teeth and gums normal Neck: no adenopathy Back: negative Resp: clear to auscultation bilaterally Chest wall: no tenderness Cardio: regular rate and rhythm, S1, S2 normal, no murmur, click, rub or gallop GI: soft, non-tender; bowel sounds normal; no masses,  no organomegaly Extremities: extremities normal, atraumatic, no cyanosis or edema Pulses: 2+ and symmetric Skin: Skin color, texture, turgor normal. No rashes or lesions Lymph nodes: Cervical, supraclavicular, and axillary nodes normal.   Recent Labs  04/13/15 1120  WBC 6.0  HGB 17.6*  HCT 52.7*  PLT 130*   No results for input(s): NA, K, CL, CO2, GLUCOSE, BUN, CREATININE, CALCIUM in the last 72  hours.     Ct Head Wo Contrast  03/20/2015  CLINICAL DATA:  50 year old male with left facial droop since 1600 hours yesterday. Initial encounter. EXAM: CT HEAD WITHOUT CONTRAST TECHNIQUE: Contiguous axial images were obtained from the base of the skull through the vertex without intravenous contrast. COMPARISON:  None. FINDINGS: Chronic right lamina papyracea fracture with superimposed widespread paranasal sinus mucosal thickening and opacification, sparing the sphenoid sinuses. Tympanic cavities and mastoids are clear. No acute osseous abnormality identified. No acute orbit or scalp soft tissue findings. Calcified atherosclerosis at the skull base. Cerebral volume is normal. Multifocal right side deep white matter capsule hypodensity (series 2, image 19). Less pronounced left hemisphere patchy white matter hypodensity. Dystrophic basal ganglia calcifications. Chronic appearing small to medium size infarcts in the left cerebellum. Probable venous variant in the right ambient cisterns series 2, image 12. No asymmetric intracranial  vascular hyperdensity identified. No cortically based acute infarct identified. No acute intracranial hemorrhage identified. No midline shift, mass effect, or evidence of intracranial mass lesion. No ventriculomegaly. IMPRESSION: 1. Age advanced chronic ischemic disease, including age indeterminate right deep white matter capsule involvement. 2. Widespread paranasal sinus inflammation. Electronically Signed   By: Genevie Ann M.D.   On: 03/20/2015 16:12   Mr Angiogram Head Wo Contrast  03/21/2015  CLINICAL DATA:  Initial evaluation for acute speech difficulty. EXAM: MRI HEAD WITHOUT CONTRAST MRA HEAD WITHOUT CONTRAST TECHNIQUE: Multiplanar, multiecho pulse sequences of the brain and surrounding structures were obtained without intravenous contrast. Angiographic images of the head were obtained using MRA technique without contrast. COMPARISON:  Prior CT from 03/20/2015. FINDINGS: MRI  HEAD FINDINGS Diffuse prominence of the CSF containing spaces is compatible with generalized age-related cerebral atrophy. Patchy T2/FLAIR hyperintensity within the periventricular and deep white matter both cerebral hemispheres most consistent with chronic small vessel ischemic disease. This is moderate in nature. Few scattered remote lacunar infarcts within the bilateral corona radiata. Small remote lacunar infarcts within the bilateral thalami and basal ganglia. Remote left cerebellar infarct present. Curvilinear focus of restricted diffusion measuring approximately 2 cm present within the posterior right lentiform nucleus extending into the right corona radiata, consistent with acute ischemic nonhemorrhagic infarct. No associated mass effect or hemorrhage. No other areas of infarction. Minimal patchy high signal on axial either overlie sequence within the anterior right corona radiata and left corpus callosum favored to be related to susceptibility artifact from remote lacunar infarcts within these regions. Abnormal flow void within the distal left vertebral artery, which may related to slow flow and/or atheromatous disease. Major intracranial vascular flow voids are otherwise maintained. No acute intracranial hemorrhage. No areas of chronic hemorrhage. No mass lesion, midline shift, or mass effect. No hydrocephalus. No extra-axial fluid collection. Major dural sinuses are patent. Craniocervical junction within normal limits. No acute abnormality within the upper cervical spine. Pituitary gland within normal limits. No acute abnormality about the orbits. Mucosal thickening throughout the ethmoidal air cells and maxillary sinuses. Right frontal sinus is largely opacified. No significant mastoid effusion. Inner ear structures grossly normal. Bone marrow signal intensity within normal limits. No scalp soft tissue abnormality. MRA HEAD FINDINGS ANTERIOR CIRCULATION: Visualized distal cervical segments of the  internal carotid arteries are patent with antegrade flow. Petrous segments patent. Multi focal atheromatous irregularity throughout the cavernous/ supraclinoid ICAs with secondary mild to moderate multi focal narrowing, slightly worse on the left. A1 segments patent. Anterior communicating artery within normal limits. Anterior cerebral arteries opacified to their distal aspects. M1 segments mildly irregular without high-grade stenosis or occlusion. MCA bifurcations within normal limits. No proximal M2 branch occlusion. Distal MCA branches opacified and fairly symmetric. Multifocal atheromatous irregularity within the distal MCA branches bilaterally. POSTERIOR CIRCULATION: Vertebral arteries patent to the vertebrobasilar junction. Right vertebral artery dominant. Posterior inferior cerebral arteries not well evaluated on this exam. Basilar artery tortuous with mild smooth narrowing without focal high-grade stenosis. Dominant right anterior inferior cerebral artery. Probable high-grade stenosis at the origin of the left superior cerebral artery. Superior cerebral arteries otherwise well opacified. Both posterior cerebral arteries arise from the basilar artery and are well opacified to their distal aspects. Multifocal atheromatous irregularity with stenoses present within the PCAs, right slightly worse than left. No aneurysm or vascular malformation. IMPRESSION: MRI HEAD IMPRESSION: 1. Approximately 2 cm curvilinear acute ischemic infarct involving the posterior right lentiform nucleus/corona radiata. No associated hemorrhage or mass effect. 2. Generalized  cerebral atrophy with moderate chronic small vessel ischemic disease and multiple remote infarcts as detailed above. 3. Inflammatory paranasal sinus disease as above. MRA HEAD IMPRESSION: 1. No large or proximal arterial branch occlusion within the intracranial circulation. 2. No high-grade or correctable stenosis within the intracranial circulation. 3. Multifocal  atheromatous irregularity throughout the carotid siphons with mild to moderate multi focal narrowing. 4. Additional multi focal atheromatous irregularity throughout the intracranial circulation as detailed above, most evident within the distal MCA branches and PCAs bilaterally. Electronically Signed   By: Jeannine Boga M.D.   On: 03/21/2015 02:13   Mr Brain Wo Contrast  03/21/2015  CLINICAL DATA:  Initial evaluation for acute speech difficulty. EXAM: MRI HEAD WITHOUT CONTRAST MRA HEAD WITHOUT CONTRAST TECHNIQUE: Multiplanar, multiecho pulse sequences of the brain and surrounding structures were obtained without intravenous contrast. Angiographic images of the head were obtained using MRA technique without contrast. COMPARISON:  Prior CT from 03/20/2015. FINDINGS: MRI HEAD FINDINGS Diffuse prominence of the CSF containing spaces is compatible with generalized age-related cerebral atrophy. Patchy T2/FLAIR hyperintensity within the periventricular and deep white matter both cerebral hemispheres most consistent with chronic small vessel ischemic disease. This is moderate in nature. Few scattered remote lacunar infarcts within the bilateral corona radiata. Small remote lacunar infarcts within the bilateral thalami and basal ganglia. Remote left cerebellar infarct present. Curvilinear focus of restricted diffusion measuring approximately 2 cm present within the posterior right lentiform nucleus extending into the right corona radiata, consistent with acute ischemic nonhemorrhagic infarct. No associated mass effect or hemorrhage. No other areas of infarction. Minimal patchy high signal on axial either overlie sequence within the anterior right corona radiata and left corpus callosum favored to be related to susceptibility artifact from remote lacunar infarcts within these regions. Abnormal flow void within the distal left vertebral artery, which may related to slow flow and/or atheromatous disease. Major  intracranial vascular flow voids are otherwise maintained. No acute intracranial hemorrhage. No areas of chronic hemorrhage. No mass lesion, midline shift, or mass effect. No hydrocephalus. No extra-axial fluid collection. Major dural sinuses are patent. Craniocervical junction within normal limits. No acute abnormality within the upper cervical spine. Pituitary gland within normal limits. No acute abnormality about the orbits. Mucosal thickening throughout the ethmoidal air cells and maxillary sinuses. Right frontal sinus is largely opacified. No significant mastoid effusion. Inner ear structures grossly normal. Bone marrow signal intensity within normal limits. No scalp soft tissue abnormality. MRA HEAD FINDINGS ANTERIOR CIRCULATION: Visualized distal cervical segments of the internal carotid arteries are patent with antegrade flow. Petrous segments patent. Multi focal atheromatous irregularity throughout the cavernous/ supraclinoid ICAs with secondary mild to moderate multi focal narrowing, slightly worse on the left. A1 segments patent. Anterior communicating artery within normal limits. Anterior cerebral arteries opacified to their distal aspects. M1 segments mildly irregular without high-grade stenosis or occlusion. MCA bifurcations within normal limits. No proximal M2 branch occlusion. Distal MCA branches opacified and fairly symmetric. Multifocal atheromatous irregularity within the distal MCA branches bilaterally. POSTERIOR CIRCULATION: Vertebral arteries patent to the vertebrobasilar junction. Right vertebral artery dominant. Posterior inferior cerebral arteries not well evaluated on this exam. Basilar artery tortuous with mild smooth narrowing without focal high-grade stenosis. Dominant right anterior inferior cerebral artery. Probable high-grade stenosis at the origin of the left superior cerebral artery. Superior cerebral arteries otherwise well opacified. Both posterior cerebral arteries arise from the  basilar artery and are well opacified to their distal aspects. Multifocal atheromatous irregularity with stenoses present within the  PCAs, right slightly worse than left. No aneurysm or vascular malformation. IMPRESSION: MRI HEAD IMPRESSION: 1. Approximately 2 cm curvilinear acute ischemic infarct involving the posterior right lentiform nucleus/corona radiata. No associated hemorrhage or mass effect. 2. Generalized cerebral atrophy with moderate chronic small vessel ischemic disease and multiple remote infarcts as detailed above. 3. Inflammatory paranasal sinus disease as above. MRA HEAD IMPRESSION: 1. No large or proximal arterial branch occlusion within the intracranial circulation. 2. No high-grade or correctable stenosis within the intracranial circulation. 3. Multifocal atheromatous irregularity throughout the carotid siphons with mild to moderate multi focal narrowing. 4. Additional multi focal atheromatous irregularity throughout the intracranial circulation as detailed above, most evident within the distal MCA branches and PCAs bilaterally. Electronically Signed   By: Jeannine Boga M.D.   On: 03/21/2015 02:13    Assessment and Plan:    50 year old gentleman with the following issues:  1. Polycythemia: He had a long-standing history dating back to at least 2007 and have had intermittent phlebotomies at that time. The etiology of his polycythemia is unclear although it appeared to be less likely polycythemia vera. He had normal white cell count and he has had mild thrombocytopenia but no thrombocytosis. His erythropoietin level and JAK 2 mutation do not support the diagnosis of polycythemia vera.  Given his recent CVA, it would be reasonable to reevaluate him for a possible myeloproliferative disorder such as polycythemia vera. His hemoglobin today is 17.6 with a hematocrit of 52. Risks and benefits of reinstituting phlebotomy was discussed today and given his recent thrombotic episode, it  would be reasonable to restart it to get his hemoglobin to around 16 or hematocrit of 50.  I will recheck his erythropoietin level and his JAK2 mutation as well. I see no role for cytoreductive therapy given his normal white cell count and borderline thrombocytopenia.  2. Thrombosis prophylaxis: He is currently on aspirin which should be continued indefinitely. It is unclear whether his thrombosis was related to polycythemia or other comorbid conditions such as hypertension, hyperlipidemia as well as polysubstance abuse. I think minimizing all these risk factors will be critical to preventing further thrombotic episodes.  3. Follow-up: Will be in 2 weeks to repeat a phlebotomy to achieve his hemoglobin and hematocrit call.

## 2015-04-13 NOTE — Progress Notes (Signed)
Please see consult note.  

## 2015-04-14 LAB — ERYTHROPOIETIN: Erythropoietin: 3.9 m[IU]/mL (ref 2.6–18.5)

## 2015-04-18 ENCOUNTER — Other Ambulatory Visit: Payer: Self-pay

## 2015-04-18 ENCOUNTER — Encounter: Payer: Self-pay | Admitting: Oncology

## 2015-04-18 NOTE — Patient Outreach (Signed)
[facility]) Care Management  04/18/2015  LENNARD TUELL January 10, 1966 [id number]   EMMI-Stroke Week#4   Outreach call to patient. Spoke with patient who states he is doing fairly well. Denies any new issues or concerns in regards to his stroke recovery. However, he does state that he has developed what appears to him as a boil under the left side of his breast. He reports some discomfort to it. Denies any drainage,fever or other symptoms. Patient advised to contact MD office to make aware and schedule follow up. He voiced understanding and will contact MD office today. Patient has advised of case closure as no further needs or concerns related to stroke at this time. He was appreciative of calls.  Plan: RN CM will notify [facility] administrative assistant of case closure.  Enzo Montgomery, RN,BSN,CCM [facility] Telephonic Care Management Coordinator Direct Phone: [phone] Toll Free: [phone] Fax: [phone]

## 2015-04-20 ENCOUNTER — Other Ambulatory Visit: Payer: Self-pay | Admitting: *Deleted

## 2015-04-20 DIAGNOSIS — D45 Polycythemia vera: Secondary | ICD-10-CM

## 2015-04-20 MED FILL — LISINOPRIL 10 MG TABLET: 10 | 30 days supply | Qty: 60 | Fill #2

## 2015-04-20 MED FILL — GABAPENTIN 300 MG CAPSULE: 300 | 30 days supply | Qty: 120 | Fill #1

## 2015-04-20 MED FILL — FOLIC ACID 1 MG TABLET: 1 | 30 days supply | Qty: 30 | Fill #1

## 2015-04-20 MED FILL — ?METFORMIN HCL 1,000 MG TAB: 1000 | 30 days supply | Qty: 60 | Fill #1

## 2015-04-23 ENCOUNTER — Ambulatory Visit: Payer: Self-pay | Admitting: Neurology

## 2015-04-25 ENCOUNTER — Other Ambulatory Visit: Payer: Self-pay | Admitting: Oncology

## 2015-04-25 DIAGNOSIS — D45 Polycythemia vera: Secondary | ICD-10-CM

## 2015-04-26 ENCOUNTER — Ambulatory Visit: Payer: MEDICAID | Admitting: Neurology

## 2015-04-27 ENCOUNTER — Other Ambulatory Visit (HOSPITAL_BASED_OUTPATIENT_CLINIC_OR_DEPARTMENT_OTHER): Payer: No Typology Code available for payment source

## 2015-04-27 ENCOUNTER — Ambulatory Visit: Payer: No Typology Code available for payment source

## 2015-04-27 DIAGNOSIS — D751 Secondary polycythemia: Secondary | ICD-10-CM

## 2015-04-27 DIAGNOSIS — D45 Polycythemia vera: Secondary | ICD-10-CM

## 2015-04-27 LAB — CBC WITH DIFFERENTIAL/PLATELET
BASO%: 0.6 % (ref 0.0–2.0)
Basophils Absolute: 0 10*3/uL (ref 0.0–0.1)
EOS%: 15.8 % — AB (ref 0.0–7.0)
Eosinophils Absolute: 1.1 10*3/uL — ABNORMAL HIGH (ref 0.0–0.5)
HEMATOCRIT: 45.4 % (ref 38.4–49.9)
HGB: 15.5 g/dL (ref 13.0–17.1)
LYMPH%: 47.8 % (ref 14.0–49.0)
MCH: 30.3 pg (ref 27.2–33.4)
MCHC: 34.1 g/dL (ref 32.0–36.0)
MCV: 88.8 fL (ref 79.3–98.0)
MONO#: 0.6 10*3/uL (ref 0.1–0.9)
MONO%: 8 % (ref 0.0–14.0)
NEUT%: 27.8 % — ABNORMAL LOW (ref 39.0–75.0)
NEUTROS ABS: 1.9 10*3/uL (ref 1.5–6.5)
PLATELETS: 147 10*3/uL (ref 140–400)
RBC: 5.11 10*6/uL (ref 4.20–5.82)
RDW: 12.4 % (ref 11.0–14.6)
WBC: 6.9 10*3/uL (ref 4.0–10.3)
lymph#: 3.3 10*3/uL (ref 0.9–3.3)

## 2015-04-27 NOTE — Progress Notes (Signed)
Per Dr. Alen Blew- pt does not need phlebotomy today.

## 2015-04-30 ENCOUNTER — Encounter: Payer: Self-pay | Admitting: Neurology

## 2015-05-03 ENCOUNTER — Ambulatory Visit: Payer: MEDICAID | Admitting: Neurology

## 2015-05-11 ENCOUNTER — Other Ambulatory Visit: Payer: Self-pay

## 2015-05-14 ENCOUNTER — Telehealth: Payer: Self-pay | Admitting: Oncology

## 2015-05-14 NOTE — Telephone Encounter (Signed)
returned call and s.w. pt and r/s missed appt....pt ok and aware °

## 2015-05-18 ENCOUNTER — Other Ambulatory Visit: Payer: No Typology Code available for payment source

## 2015-05-18 ENCOUNTER — Telehealth: Payer: Self-pay | Admitting: Oncology

## 2015-05-18 NOTE — Telephone Encounter (Signed)
returned call adn s.w. pt wife and advised to pt to call back to r/s missed appt

## 2015-05-23 ENCOUNTER — Telehealth: Payer: Self-pay | Admitting: Oncology

## 2015-05-23 NOTE — Telephone Encounter (Signed)
returned call and lvm confirming appts....pt ok and aware

## 2015-05-25 ENCOUNTER — Ambulatory Visit (HOSPITAL_BASED_OUTPATIENT_CLINIC_OR_DEPARTMENT_OTHER): Payer: No Typology Code available for payment source

## 2015-05-25 ENCOUNTER — Other Ambulatory Visit (HOSPITAL_BASED_OUTPATIENT_CLINIC_OR_DEPARTMENT_OTHER): Payer: No Typology Code available for payment source

## 2015-05-25 VITALS — BP 116/84 | HR 89 | Temp 98.2°F | Resp 16

## 2015-05-25 DIAGNOSIS — D45 Polycythemia vera: Secondary | ICD-10-CM

## 2015-05-25 LAB — CBC WITH DIFFERENTIAL/PLATELET
BASO%: 0.4 % (ref 0.0–2.0)
BASOS ABS: 0 10*3/uL (ref 0.0–0.1)
EOS%: 9.2 % — ABNORMAL HIGH (ref 0.0–7.0)
Eosinophils Absolute: 0.5 10*3/uL (ref 0.0–0.5)
HEMATOCRIT: 49 % (ref 38.4–49.9)
HEMOGLOBIN: 16.5 g/dL (ref 13.0–17.1)
LYMPH#: 2.5 10*3/uL (ref 0.9–3.3)
LYMPH%: 46.8 % (ref 14.0–49.0)
MCH: 30.4 pg (ref 27.2–33.4)
MCHC: 33.7 g/dL (ref 32.0–36.0)
MCV: 90.2 fL (ref 79.3–98.0)
MONO#: 0.4 10*3/uL (ref 0.1–0.9)
MONO%: 7.7 % (ref 0.0–14.0)
NEUT#: 1.9 10*3/uL (ref 1.5–6.5)
NEUT%: 35.9 % — AB (ref 39.0–75.0)
Platelets: 137 10*3/uL — ABNORMAL LOW (ref 140–400)
RBC: 5.43 10*6/uL (ref 4.20–5.82)
RDW: 14.8 % — ABNORMAL HIGH (ref 11.0–14.6)
WBC: 5.3 10*3/uL (ref 4.0–10.3)
nRBC: 0 % (ref 0–0)

## 2015-05-25 NOTE — Patient Instructions (Signed)
Therapeutic Phlebotomy, Care After  Refer to this sheet in the next few weeks. These instructions provide you with information about caring for yourself after your procedure. Your health care provider may also give you more specific instructions. Your treatment has been planned according to current medical practices, but problems sometimes occur. Call your health care provider if you have any problems or questions after your procedure.  WHAT TO EXPECT AFTER THE PROCEDURE  After your procedure, it is common to have:   Light-headedness or dizziness. You may feel faint.   Nausea.   Tiredness.  HOME CARE INSTRUCTIONS  Activities   Return to your normal activities as directed by your health care provider. Most people can go back to their normal activities right away.   Avoid strenuous physical activity and heavy lifting or pulling for about 5 hours after the procedure. Do not lift anything that is heavier than 10 lb (4.5 kg).   Athletes should avoid strenuous exercise for at least 12 hours.   Change positions slowly for the remainder of the day. This will help to prevent light-headedness or fainting.   If you feel light-headed, lie down until the feeling goes away.  Eating and Drinking   Be sure to eat well-balanced meals for the next 24 hours.   Drink enough fluid to keep your urine clear or pale yellow.   Avoid drinking alcohol on the day that you had the procedure.  Care of the Needle Insertion Site   Keep your bandage dry. You can remove the bandage after about 5 hours or as directed by your health care provider.   If you have bleeding from the needle insertion site, elevate your arm and press firmly on the site until the bleeding stops.   If you have bruising at the site, apply ice to the area:   Put ice in a plastic bag.   Place a towel between your skin and the bag.   Leave the ice on for 20 minutes, 2-3 times a day for the first 24 hours.   If the swelling does not go away after 24 hours, apply  a warm, moist washcloth to the area for 20 minutes, 2-3 times a day.  General Instructions   Avoid smoking for at least 30 minutes after the procedure.   Keep all follow-up visits as directed by your health care provider. It is important to continue with further therapeutic phlebotomy treatments as directed.  SEEK MEDICAL CARE IF:   You have redness, swelling, or pain at the needle insertion site.   You have fluid, blood, or pus coming from the needle insertion site.   You feel light-headed, dizzy, or nauseated, and the feeling does not go away.   You notice new bruising at the needle insertion site.   You feel weaker than normal.   You have a fever or chills.  SEEK IMMEDIATE MEDICAL CARE IF:   You have severe nausea or vomiting.   You have chest pain.   You have trouble breathing.    This information is not intended to replace advice given to you by your health care provider. Make sure you discuss any questions you have with your health care provider.    Document Released: 06/17/2010 Document Revised: 05/30/2014 Document Reviewed: 01/09/2014  Elsevier Interactive Patient Education 2016 [employer]

## 2015-05-25 NOTE — Progress Notes (Signed)
Pt ambulatory to infusion center. Pt HCT 49 and to receive phlebotomy today.   1053: 18 gauge PIV placed to outer right AC. Pt tolerated well but kept moving arm. Blood flow slowed and pt complaining of irritation to site. 50 cc of blood removed. PIV removed and guaze dressing placed to site.   1105: 18 gauge PIV placed to inner Right AC. Good blood return noted and pt tolerating well. Phlebotomy completed with 400 ccs of blood removed. Pt denied any complaints.   1127: Phlebotomy completed.Pt agreed to stay 30 mins post phlebotomy. Gauze dressing placed to site. Pt given juice but denied any food. Pt denied any complaints.  1200: Pt left ambulatory in no apparent distress and VSS.

## 2015-05-25 NOTE — Progress Notes (Signed)
Proceed with phlebotomy per Dr Alen Blew with hgb 16.5 and hct 49.0

## 2015-05-30 MED FILL — ?METFORMIN HCL 1,000 MG TAB: 1000 | 30 days supply | Qty: 60 | Fill #2

## 2015-06-01 ENCOUNTER — Other Ambulatory Visit: Payer: Self-pay | Admitting: Internal Medicine

## 2015-06-01 DIAGNOSIS — I10 Essential (primary) hypertension: Secondary | ICD-10-CM

## 2015-06-01 MED ORDER — LISINOPRIL 10 MG PO TABS: 20.0000 mg | ORAL_TABLET | Freq: Every day | ORAL | Status: DC

## 2015-06-01 MED FILL — LISINOPRIL 10 MG TABLET: 10 | 30 days supply | Qty: 60 | Fill #0

## 2015-06-01 NOTE — Telephone Encounter (Signed)
Lisinopril refilled. Patient aware.

## 2015-06-01 NOTE — Telephone Encounter (Signed)
Pt. Called requesting a med refill on lisinopril. Please f/u with pt.

## 2015-06-08 ENCOUNTER — Ambulatory Visit: Payer: No Typology Code available for payment source

## 2015-06-08 ENCOUNTER — Other Ambulatory Visit (HOSPITAL_BASED_OUTPATIENT_CLINIC_OR_DEPARTMENT_OTHER): Payer: No Typology Code available for payment source

## 2015-06-08 DIAGNOSIS — D45 Polycythemia vera: Secondary | ICD-10-CM

## 2015-06-08 LAB — CBC WITH DIFFERENTIAL/PLATELET
BASO%: 0.3 % (ref 0.0–2.0)
BASOS ABS: 0 10*3/uL (ref 0.0–0.1)
EOS ABS: 0.4 10*3/uL (ref 0.0–0.5)
EOS%: 7.1 % — ABNORMAL HIGH (ref 0.0–7.0)
HCT: 44.5 % (ref 38.4–49.9)
HGB: 14.9 g/dL (ref 13.0–17.1)
LYMPH%: 56.6 % — AB (ref 14.0–49.0)
MCH: 31 pg (ref 27.2–33.4)
MCHC: 33.5 g/dL (ref 32.0–36.0)
MCV: 92.5 fL (ref 79.3–98.0)
MONO#: 0.4 10*3/uL (ref 0.1–0.9)
MONO%: 6.1 % (ref 0.0–14.0)
NEUT#: 1.7 10*3/uL (ref 1.5–6.5)
NEUT%: 29.9 % — AB (ref 39.0–75.0)
PLATELETS: 138 10*3/uL — AB (ref 140–400)
RBC: 4.81 10*6/uL (ref 4.20–5.82)
RDW: 16 % — ABNORMAL HIGH (ref 11.0–14.6)
WBC: 5.8 10*3/uL (ref 4.0–10.3)
lymph#: 3.3 10*3/uL (ref 0.9–3.3)

## 2015-06-08 NOTE — Progress Notes (Signed)
Reviewed patient's labs with Dr. Alen Blew. Per same, Mr. Kozinski will not require phlebotomy today. Patient notified and encouraged to keep future appointments. Verbalized understanding.

## 2015-06-20 ENCOUNTER — Ambulatory Visit: Payer: No Typology Code available for payment source | Admitting: Family Medicine

## 2015-06-22 ENCOUNTER — Ambulatory Visit: Payer: Self-pay | Admitting: Oncology

## 2015-06-22 ENCOUNTER — Other Ambulatory Visit: Payer: Self-pay

## 2015-06-26 ENCOUNTER — Telehealth: Payer: Self-pay | Admitting: Oncology

## 2015-06-26 NOTE — Telephone Encounter (Signed)
Pt called to r/s missed appt.. Pt ok and aware of new d.t

## 2015-06-28 ENCOUNTER — Encounter: Payer: Self-pay | Admitting: Internal Medicine

## 2015-06-30 ENCOUNTER — Encounter (HOSPITAL_COMMUNITY): Payer: Self-pay

## 2015-06-30 ENCOUNTER — Emergency Department (HOSPITAL_COMMUNITY)
Admission: EM | Admit: 2015-06-30 | Discharge: 2015-06-30 | Disposition: A | Discharge status: Home / Self Care | Payer: No Typology Code available for payment source | Attending: Emergency Medicine | Admitting: Emergency Medicine

## 2015-06-30 DIAGNOSIS — N189 Chronic kidney disease, unspecified: Secondary | ICD-10-CM | POA: Insufficient documentation

## 2015-06-30 DIAGNOSIS — Y92410 Unspecified street and highway as the place of occurrence of the external cause: Secondary | ICD-10-CM | POA: Diagnosis not present

## 2015-06-30 DIAGNOSIS — E1122 Type 2 diabetes mellitus with diabetic chronic kidney disease: Secondary | ICD-10-CM | POA: Insufficient documentation

## 2015-06-30 DIAGNOSIS — Z7982 Long term (current) use of aspirin: Secondary | ICD-10-CM | POA: Diagnosis not present

## 2015-06-30 DIAGNOSIS — F17210 Nicotine dependence, cigarettes, uncomplicated: Secondary | ICD-10-CM | POA: Insufficient documentation

## 2015-06-30 DIAGNOSIS — Y999 Unspecified external cause status: Secondary | ICD-10-CM | POA: Diagnosis not present

## 2015-06-30 DIAGNOSIS — M545 Low back pain, unspecified: Secondary | ICD-10-CM

## 2015-06-30 DIAGNOSIS — Z7984 Long term (current) use of oral hypoglycemic drugs: Secondary | ICD-10-CM | POA: Diagnosis not present

## 2015-06-30 DIAGNOSIS — E1142 Type 2 diabetes mellitus with diabetic polyneuropathy: Secondary | ICD-10-CM | POA: Diagnosis not present

## 2015-06-30 DIAGNOSIS — Z79899 Other long term (current) drug therapy: Secondary | ICD-10-CM | POA: Insufficient documentation

## 2015-06-30 DIAGNOSIS — I129 Hypertensive chronic kidney disease with stage 1 through stage 4 chronic kidney disease, or unspecified chronic kidney disease: Secondary | ICD-10-CM | POA: Diagnosis not present

## 2015-06-30 DIAGNOSIS — Y939 Activity, unspecified: Secondary | ICD-10-CM | POA: Insufficient documentation

## 2015-06-30 MED ORDER — IBUPROFEN 400 MG PO TABS: 400.0000 mg | ORAL_TABLET | Freq: Three times a day (TID) | ORAL | Status: DC

## 2015-06-30 MED ORDER — CYCLOBENZAPRINE HCL 10 MG PO TABS: 10.0000 mg | ORAL_TABLET | Freq: Two times a day (BID) | ORAL | Status: DC | PRN

## 2015-06-30 NOTE — Discharge Instructions (Signed)
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
## 2015-06-30 NOTE — ED Notes (Signed)
Pt placed into gown 
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
## 2015-07-12 ENCOUNTER — Other Ambulatory Visit (HOSPITAL_BASED_OUTPATIENT_CLINIC_OR_DEPARTMENT_OTHER): Payer: No Typology Code available for payment source

## 2015-07-12 ENCOUNTER — Ambulatory Visit (HOSPITAL_BASED_OUTPATIENT_CLINIC_OR_DEPARTMENT_OTHER): Payer: No Typology Code available for payment source | Admitting: Oncology

## 2015-07-12 ENCOUNTER — Telehealth: Payer: Self-pay | Admitting: *Deleted

## 2015-07-12 ENCOUNTER — Telehealth: Payer: Self-pay | Admitting: Oncology

## 2015-07-12 VITALS — BP 166/88 | HR 85 | Temp 97.9°F | Resp 20 | Ht 67.0 in | Wt 175.2 lb

## 2015-07-12 DIAGNOSIS — D45 Polycythemia vera: Secondary | ICD-10-CM

## 2015-07-12 DIAGNOSIS — D751 Secondary polycythemia: Secondary | ICD-10-CM

## 2015-07-12 LAB — CBC WITH DIFFERENTIAL/PLATELET
BASO%: 0.5 % (ref 0.0–2.0)
Basophils Absolute: 0 10*3/uL (ref 0.0–0.1)
EOS%: 8.8 % — AB (ref 0.0–7.0)
Eosinophils Absolute: 0.5 10*3/uL (ref 0.0–0.5)
HEMATOCRIT: 47.5 % (ref 38.4–49.9)
HEMOGLOBIN: 15.8 g/dL (ref 13.0–17.1)
LYMPH%: 45.7 % (ref 14.0–49.0)
MCH: 31.7 pg (ref 27.2–33.4)
MCHC: 33.3 g/dL (ref 32.0–36.0)
MCV: 95.2 fL (ref 79.3–98.0)
MONO#: 0.6 10*3/uL (ref 0.1–0.9)
MONO%: 10.3 % (ref 0.0–14.0)
NEUT#: 2.1 10*3/uL (ref 1.5–6.5)
NEUT%: 34.7 % — AB (ref 39.0–75.0)
Platelets: 143 10*3/uL (ref 140–400)
RBC: 4.99 10*6/uL (ref 4.20–5.82)
RDW: 14.3 % (ref 11.0–14.6)
WBC: 5.9 10*3/uL (ref 4.0–10.3)
lymph#: 2.7 10*3/uL (ref 0.9–3.3)

## 2015-07-12 NOTE — Telephone Encounter (Signed)
per pof to schpt  appt-MW sch trmt
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
## 2015-07-12 NOTE — Telephone Encounter (Signed)
Per staff phone call and POF I have schedueld appts. Scheduler advised of appts.  JMW  

## 2015-07-27 ENCOUNTER — Encounter: Payer: Self-pay | Admitting: Family Medicine

## 2015-07-27 ENCOUNTER — Ambulatory Visit: Payer: No Typology Code available for payment source | Attending: Family Medicine | Admitting: Family Medicine

## 2015-07-27 VITALS — BP 136/84 | HR 87 | Temp 97.8°F | Resp 16 | Ht 67.0 in | Wt 174.6 lb

## 2015-07-27 DIAGNOSIS — I10 Essential (primary) hypertension: Secondary | ICD-10-CM

## 2015-07-27 DIAGNOSIS — E119 Type 2 diabetes mellitus without complications: Secondary | ICD-10-CM

## 2015-07-27 DIAGNOSIS — D45 Polycythemia vera: Secondary | ICD-10-CM

## 2015-07-27 DIAGNOSIS — E1169 Type 2 diabetes mellitus with other specified complication: Secondary | ICD-10-CM | POA: Insufficient documentation

## 2015-07-27 DIAGNOSIS — E785 Hyperlipidemia, unspecified: Secondary | ICD-10-CM | POA: Insufficient documentation

## 2015-07-27 DIAGNOSIS — E1142 Type 2 diabetes mellitus with diabetic polyneuropathy: Secondary | ICD-10-CM

## 2015-07-27 MED ORDER — GABAPENTIN 300 MG PO CAPS: 600.0000 mg | ORAL_CAPSULE | Freq: Two times a day (BID) | ORAL | Status: DC

## 2015-07-27 MED ORDER — LISINOPRIL 20 MG PO TABS: 20.0000 mg | ORAL_TABLET | Freq: Every day | ORAL | Status: DC

## 2015-07-27 MED ORDER — ATORVASTATIN CALCIUM 40 MG PO TABS: 40.0000 mg | ORAL_TABLET | Freq: Every day | ORAL | Status: DC

## 2015-07-27 MED ORDER — METFORMIN HCL 1000 MG PO TABS: 1000.0000 mg | ORAL_TABLET | Freq: Two times a day (BID) | ORAL | Status: DC

## 2015-07-27 MED FILL — metFORMIN HCL 1000 MG TABS: 1000 | 30 days supply | Qty: 60 | Fill #0

## 2015-07-27 MED FILL — ?ATORVASTATIN 40MG TABLET: 40 | 30 days supply | Qty: 30 | Fill #0

## 2015-07-27 MED FILL — ?LISINOPRIL 20 MG TABLET: 20 | 30 days supply | Qty: 30 | Fill #0

## 2015-07-27 MED FILL — GABAPENTIN 300 MG CAPSULE: 300 | 30 days supply | Qty: 120 | Fill #0

## 2015-07-27 NOTE — Progress Notes (Signed)
Subjective:  Patient ID: Eric Morton, male    DOB: May 08, 1965  Age: 50 y.o. MRN: [id number]  CC: Medication Refill   HPI Eric Morton is a 50 year old male with history of polycythemia vera, type 2 diabetes mellitus (A1c 6.2), hyperlipidemia, diabetic neuropathy, previous history of stroke who comes into the clinic to establish care with me. He was previously followed by the nurse practitioner who is no longer with the practice.  He undergoes monthly phlebotomy at the cancer center for his polycythemia vera.  Review of his labs indicate hyperlipidemia however he is not on a statin but remains only on Zetia.  He has not been checking his blood sugars lately but denies hypoglycemic symptoms. Does have diabetic neuropathy which is controlled on gabapentin. Has no complaints today.  Unable to afford his medications and will need assistance.  Outpatient Prescriptions Prior to Visit  Medication Sig Dispense Refill  . aspirin EC 81 MG tablet Take 1 tablet (81 mg total) by mouth daily. 30 tablet 2  . thiamine 100 MG tablet Take 1 tablet (100 mg total) by mouth daily. 30 tablet 2  . gabapentin (NEURONTIN) 300 MG capsule Take 2 capsules (600 mg total) by mouth 2 (two) times daily. 120 capsule 3  . lisinopril (PRINIVIL,ZESTRIL) 10 MG tablet Take 2 tablets (20 mg total) by mouth daily. 60 tablet 3  . metFORMIN (GLUCOPHAGE) 1000 MG tablet Take 1 tablet (1,000 mg total) by mouth 2 (two) times daily with a meal. 60 tablet 3  . folic acid (FOLVITE) 1 MG tablet Take 1 tablet (1 mg total) by mouth daily. (Patient not taking: Reported on 07/27/2015) 30 tablet 1  . pantoprazole (PROTONIX) 40 MG tablet Take 1 tablet (40 mg total) by mouth daily at 6 (six) AM. (Patient not taking: Reported on 07/27/2015) 30 tablet 0  . cyclobenzaprine (FLEXERIL) 10 MG tablet Take 1 tablet (10 mg total) by mouth 2 (two) times daily as needed for muscle spasms. (Patient not taking: Reported on 07/12/2015) 20 tablet 0  .  ezetimibe (ZETIA) 10 MG tablet Take 1 tablet (10 mg total) by mouth daily. (Patient not taking: Reported on 07/27/2015) 30 tablet 2  . ibuprofen (ADVIL,MOTRIN) 400 MG tablet Take 1 tablet (400 mg total) by mouth 3 (three) times daily. (Patient not taking: Reported on 07/12/2015) 15 tablet 0   No facility-administered medications prior to visit.    ROS Review of Systems  Constitutional: Negative for activity change and appetite change.  HENT: Negative for sinus pressure and sore throat.   Eyes: Negative for visual disturbance.  Respiratory: Negative for cough, chest tightness and shortness of breath.   Cardiovascular: Negative for chest pain and leg swelling.  Gastrointestinal: Negative for abdominal pain, diarrhea, constipation and abdominal distention.  Endocrine: Negative.   Genitourinary: Negative for dysuria.  Musculoskeletal: Negative for myalgias and joint swelling.  Skin: Negative for rash.  Allergic/Immunologic: Negative.   Neurological: Negative for weakness, light-headedness and numbness.  Psychiatric/Behavioral: Negative for suicidal ideas and dysphoric mood.    Objective:  BP 136/84 mmHg  Pulse 87  Temp(Src) 97.8 F (36.6 C) (Oral)  Resp 16  Ht 5\' 7"  (1.702 m)  Wt 174 lb 9.6 oz (79.198 kg)  BMI 27.34 kg/m2  SpO2 99%  BP/Weight 07/27/2015 [id number]  Systolic BP [id number]  Diastolic BP 84 88 78  Wt. (Lbs) 174.6 175.2 -  BMI 27.34 27.43 -      Physical Exam  Constitutional: He is oriented to  person, place, and time. He appears well-developed and well-nourished.  Cardiovascular: Normal rate, normal heart sounds and intact distal pulses.   No murmur heard. Pulmonary/Chest: Effort normal and breath sounds normal. He has no wheezes. He has no rales. He exhibits no tenderness.  Abdominal: Soft. Bowel sounds are normal. He exhibits no distension and no mass. There is no tenderness.  Musculoskeletal: Normal range of motion.  Neurological: He is alert and  oriented to person, place, and time.  Skin: Skin is warm and dry.  Psychiatric: He has a normal mood and affect.     Lab Results  Component Value Date   HGBA1C 8.1 04/02/2015    CBC Latest Ref Rng 07/12/2015 06/08/2015 05/25/2015  WBC 4.0 - 10.3 10e3/uL 5.9 5.8 5.3  Hemoglobin 13.0 - 17.1 g/dL 15.8 14.9 16.5  Hematocrit 38.4 - 49.9 % 47.5 44.5 49.0  Platelets 140 - 400 10e3/uL 143 138(L) 137(L)     Lipid Panel     Component Value Date/Time   CHOL 219* 03/21/2015 0339   TRIG 175* 03/21/2015 0339   HDL 64 03/21/2015 0339   CHOLHDL 3.4 03/21/2015 0339   VLDL 35 03/21/2015 0339   LDLCALC 120* 03/21/2015 0339      Assessment & Plan:   1. Polycythemia vera ([place]) Monthly phlebotomy at the cancer center.  2. Type 2 diabetes mellitus without complication, without long-term current use of insulin ([place]) Controlled with A1c of 6.2 Continue medications We have spoken to the pharmacy to allow him a courtesy refill due to his financial constraints. - metFORMIN (GLUCOPHAGE) 1000 MG tablet; Take 1 tablet (1,000 mg total) by mouth 2 (two) times daily with a meal.  Dispense: 60 tablet; Refill: 3  3. Diabetic polyneuropathy associated with type 2 diabetes mellitus (HCC) Controlled - gabapentin (NEURONTIN) 300 MG capsule; Take 2 capsules (600 mg total) by mouth 2 (two) times daily.  Dispense: 120 capsule; Refill: 3  4. Essential hypertension Controlled - lisinopril (PRINIVIL,ZESTRIL) 20 MG tablet; Take 1 tablet (20 mg total) by mouth daily.  Dispense: 30 tablet; Refill: 3  5. Hyperlipidemia Uncontrolled He was previously a city of which I have substituted with atorvastatin due to his increased cardiovascular risk. - atorvastatin (LIPITOR) 40 MG tablet; Take 1 tablet (40 mg total) by mouth daily.  Dispense: 30 tablet; Refill: 3   Meds ordered this encounter  Medications  . metFORMIN (GLUCOPHAGE) 1000 MG tablet    Sig: Take 1 tablet (1,000 mg total) by mouth 2 (two) times daily with  a meal.    Dispense:  60 tablet    Refill:  3  . gabapentin (NEURONTIN) 300 MG capsule    Sig: Take 2 capsules (600 mg total) by mouth 2 (two) times daily.    Dispense:  120 capsule    Refill:  3  . lisinopril (PRINIVIL,ZESTRIL) 20 MG tablet    Sig: Take 1 tablet (20 mg total) by mouth daily.    Dispense:  30 tablet    Refill:  3  . atorvastatin (LIPITOR) 40 MG tablet    Sig: Take 1 tablet (40 mg total) by mouth daily.    Dispense:  30 tablet    Refill:  3    Follow-up: Return in about 3 months (around 10/27/2015) for Follow-up of diabetes mellitus.   Arnoldo Morale MD

## 2015-07-27 NOTE — Progress Notes (Signed)
Pt here for medication refill. Pt states he has been out for 2 months and is concerned about the cost of his medications. Pt denies pain today.  Pt has eaten and has taken his metformin. CBG is 179. A1C is 6.2.

## 2015-07-27 NOTE — Patient Instructions (Signed)
Diabetes Mellitus and Food It is important for you to manage your blood sugar (glucose) level. Your blood glucose level can be greatly affected by what you eat. Eating healthier foods in the appropriate amounts throughout the day at about the same time each day will help you control your blood glucose level. It can also help slow or prevent worsening of your diabetes mellitus. Healthy eating may even help you improve the level of your blood pressure and reach or maintain a healthy weight.  General recommendations for healthful eating and cooking habits include:  Eating meals and snacks regularly. Avoid going long periods of time without eating to lose weight.  Eating a diet that consists mainly of plant-based foods, such as fruits, vegetables, nuts, legumes, and whole grains.  Using low-heat cooking methods, such as baking, instead of high-heat cooking methods, such as deep frying. Work with your dietitian to make sure you understand how to use the Nutrition Facts information on food labels. HOW CAN FOOD AFFECT ME? Carbohydrates Carbohydrates affect your blood glucose level more than any other type of food. Your dietitian will help you determine how many carbohydrates to eat at each meal and teach you how to count carbohydrates. Counting carbohydrates is important to keep your blood glucose at a healthy level, especially if you are using insulin or taking certain medicines for diabetes mellitus. Alcohol Alcohol can cause sudden decreases in blood glucose (hypoglycemia), especially if you use insulin or take certain medicines for diabetes mellitus. Hypoglycemia can be a life-threatening condition. Symptoms of hypoglycemia (sleepiness, dizziness, and disorientation) are similar to symptoms of having too much alcohol.  If your health care provider has given you approval to drink alcohol, do so in moderation and use the following guidelines:  Women should not have more than one drink per day, and men  should not have more than two drinks per day. One drink is equal to:  12 oz of beer.  5 oz of wine.  1 oz of hard liquor.  Do not drink on an empty stomach.  Keep yourself hydrated. Have water, diet soda, or unsweetened iced tea.  Regular soda, juice, and other mixers might contain a lot of carbohydrates and should be counted. WHAT FOODS ARE NOT RECOMMENDED? As you make food choices, it is important to remember that all foods are not the same. Some foods have fewer nutrients per serving than other foods, even though they might have the same number of calories or carbohydrates. It is difficult to get your body what it needs when you eat foods with fewer nutrients. Examples of foods that you should avoid that are high in calories and carbohydrates but low in nutrients include:  Trans fats (most processed foods list trans fats on the Nutrition Facts label).  Regular soda.  Juice.  Candy.  Sweets, such as cake, pie, doughnuts, and cookies.  Fried foods. WHAT FOODS CAN I EAT? Eat nutrient-rich foods, which will nourish your body and keep you healthy. The food you should eat also will depend on several factors, including:  The calories you need.  The medicines you take.  Your weight.  Your blood glucose level.  Your blood pressure level.  Your cholesterol level. You should eat a variety of foods, including:  Protein.  Lean cuts of meat.  Proteins low in saturated fats, such as fish, egg whites, and beans. Avoid processed meats.  Fruits and vegetables.  Fruits and vegetables that may help control blood glucose levels, such as apples, mangoes, and   yams.  Dairy products.  Choose fat-free or low-fat dairy products, such as milk, yogurt, and cheese.  Grains, bread, pasta, and rice.  Choose whole grain products, such as multigrain bread, whole oats, and brown rice. These foods may help control blood pressure.  Fats.  Foods containing healthful fats, such as nuts,  avocado, olive oil, canola oil, and fish. DOES EVERYONE WITH DIABETES MELLITUS HAVE THE SAME MEAL PLAN? Because every person with diabetes mellitus is different, there is not one meal plan that works for everyone. It is very important that you meet with a dietitian who will help you create a meal plan that is just right for you.   This information is not intended to replace advice given to you by your health care provider. Make sure you discuss any questions you have with your health care provider.   Document Released: 10/10/2004 Document Revised: 02/03/2014 Document Reviewed: 12/10/2012 Elsevier Interactive Patient Education 2016 [employer]  

## 2015-08-09 ENCOUNTER — Other Ambulatory Visit (HOSPITAL_BASED_OUTPATIENT_CLINIC_OR_DEPARTMENT_OTHER): Payer: No Typology Code available for payment source

## 2015-08-09 ENCOUNTER — Ambulatory Visit (HOSPITAL_BASED_OUTPATIENT_CLINIC_OR_DEPARTMENT_OTHER): Payer: No Typology Code available for payment source

## 2015-08-09 VITALS — BP 128/76 | HR 81 | Temp 97.8°F | Resp 18

## 2015-08-09 DIAGNOSIS — D751 Secondary polycythemia: Secondary | ICD-10-CM

## 2015-08-09 DIAGNOSIS — D45 Polycythemia vera: Secondary | ICD-10-CM

## 2015-08-09 LAB — CBC WITH DIFFERENTIAL/PLATELET
BASO%: 0.9 % (ref 0.0–2.0)
Basophils Absolute: 0.1 10*3/uL (ref 0.0–0.1)
EOS%: 8.8 % — AB (ref 0.0–7.0)
Eosinophils Absolute: 0.5 10*3/uL (ref 0.0–0.5)
HEMATOCRIT: 54.2 % — AB (ref 38.4–49.9)
HGB: 17.6 g/dL — ABNORMAL HIGH (ref 13.0–17.1)
LYMPH#: 2.9 10*3/uL (ref 0.9–3.3)
LYMPH%: 49.6 % — AB (ref 14.0–49.0)
MCH: 31 pg (ref 27.2–33.4)
MCHC: 32.4 g/dL (ref 32.0–36.0)
MCV: 95.6 fL (ref 79.3–98.0)
MONO#: 0.5 10*3/uL (ref 0.1–0.9)
MONO%: 9.2 % (ref 0.0–14.0)
NEUT#: 1.8 10*3/uL (ref 1.5–6.5)
NEUT%: 31.5 % — AB (ref 39.0–75.0)
Platelets: 149 10*3/uL (ref 140–400)
RBC: 5.67 10*6/uL (ref 4.20–5.82)
RDW: 13.9 % (ref 11.0–14.6)
WBC: 5.8 10*3/uL (ref 4.0–10.3)

## 2015-08-09 NOTE — Patient Instructions (Signed)
Therapeutic Phlebotomy, Care After  Refer to this sheet in the next few weeks. These instructions provide you with information about caring for yourself after your procedure. Your health care provider may also give you more specific instructions. Your treatment has been planned according to current medical practices, but problems sometimes occur. Call your health care provider if you have any problems or questions after your procedure.  WHAT TO EXPECT AFTER THE PROCEDURE  After your procedure, it is common to have:   Light-headedness or dizziness. You may feel faint.   Nausea.   Tiredness.  HOME CARE INSTRUCTIONS  Activities   Return to your normal activities as directed by your health care provider. Most people can go back to their normal activities right away.   Avoid strenuous physical activity and heavy lifting or pulling for about 5 hours after the procedure. Do not lift anything that is heavier than 10 lb (4.5 kg).   Athletes should avoid strenuous exercise for at least 12 hours.   Change positions slowly for the remainder of the day. This will help to prevent light-headedness or fainting.   If you feel light-headed, lie down until the feeling goes away.  Eating and Drinking   Be sure to eat well-balanced meals for the next 24 hours.   Drink enough fluid to keep your urine clear or pale yellow.   Avoid drinking alcohol on the day that you had the procedure.  Care of the Needle Insertion Site   Keep your bandage dry. You can remove the bandage after about 5 hours or as directed by your health care provider.   If you have bleeding from the needle insertion site, elevate your arm and press firmly on the site until the bleeding stops.   If you have bruising at the site, apply ice to the area:   Put ice in a plastic bag.   Place a towel between your skin and the bag.   Leave the ice on for 20 minutes, 2-3 times a day for the first 24 hours.   If the swelling does not go away after 24 hours, apply  a warm, moist washcloth to the area for 20 minutes, 2-3 times a day.  General Instructions   Avoid smoking for at least 30 minutes after the procedure.   Keep all follow-up visits as directed by your health care provider. It is important to continue with further therapeutic phlebotomy treatments as directed.  SEEK MEDICAL CARE IF:   You have redness, swelling, or pain at the needle insertion site.   You have fluid, blood, or pus coming from the needle insertion site.   You feel light-headed, dizzy, or nauseated, and the feeling does not go away.   You notice new bruising at the needle insertion site.   You feel weaker than normal.   You have a fever or chills.  SEEK IMMEDIATE MEDICAL CARE IF:   You have severe nausea or vomiting.   You have chest pain.   You have trouble breathing.    This information is not intended to replace advice given to you by your health care provider. Make sure you discuss any questions you have with your health care provider.    Document Released: 06/17/2010 Document Revised: 05/30/2014 Document Reviewed: 01/09/2014  Elsevier Interactive Patient Education 2016 [employer]

## 2015-09-06 ENCOUNTER — Other Ambulatory Visit (HOSPITAL_BASED_OUTPATIENT_CLINIC_OR_DEPARTMENT_OTHER): Payer: No Typology Code available for payment source

## 2015-09-06 ENCOUNTER — Ambulatory Visit (HOSPITAL_BASED_OUTPATIENT_CLINIC_OR_DEPARTMENT_OTHER): Payer: No Typology Code available for payment source

## 2015-09-06 DIAGNOSIS — D751 Secondary polycythemia: Secondary | ICD-10-CM

## 2015-09-06 DIAGNOSIS — D45 Polycythemia vera: Secondary | ICD-10-CM

## 2015-09-06 LAB — CBC WITH DIFFERENTIAL/PLATELET
BASO%: 0.3 % (ref 0.0–2.0)
BASOS ABS: 0 10*3/uL (ref 0.0–0.1)
EOS%: 8.4 % — AB (ref 0.0–7.0)
Eosinophils Absolute: 0.5 10*3/uL (ref 0.0–0.5)
HCT: 49.2 % (ref 38.4–49.9)
HEMOGLOBIN: 16.9 g/dL (ref 13.0–17.1)
LYMPH%: 55.2 % — AB (ref 14.0–49.0)
MCH: 31.2 pg (ref 27.2–33.4)
MCHC: 34.3 g/dL (ref 32.0–36.0)
MCV: 90.8 fL (ref 79.3–98.0)
MONO#: 0.4 10*3/uL (ref 0.1–0.9)
MONO%: 7.4 % (ref 0.0–14.0)
NEUT#: 1.7 10*3/uL (ref 1.5–6.5)
NEUT%: 28.7 % — ABNORMAL LOW (ref 39.0–75.0)
Platelets: 140 10*3/uL (ref 140–400)
RBC: 5.42 10*6/uL (ref 4.20–5.82)
RDW: 12 % (ref 11.0–14.6)
WBC: 5.8 10*3/uL (ref 4.0–10.3)
lymph#: 3.2 10*3/uL (ref 0.9–3.3)

## 2015-09-06 NOTE — Progress Notes (Signed)
Therapeutic phlebotomy performed via 18 g IV (see IV flowsheet) by Octavia Bruckner RN and myself - 500 grams removed and pt tolerated well. VS obtained before and after and snack and beverage provided.

## 2015-09-06 NOTE — Progress Notes (Signed)
Attempted phlebotomy x 2 - one in each [facility] and no blood return.

## 2015-09-19 ENCOUNTER — Ambulatory Visit: Payer: Self-pay | Admitting: Internal Medicine

## 2015-10-04 ENCOUNTER — Other Ambulatory Visit (HOSPITAL_BASED_OUTPATIENT_CLINIC_OR_DEPARTMENT_OTHER): Payer: Self-pay

## 2015-10-04 ENCOUNTER — Ambulatory Visit (HOSPITAL_BASED_OUTPATIENT_CLINIC_OR_DEPARTMENT_OTHER): Payer: Self-pay

## 2015-10-04 VITALS — BP 119/69 | HR 83 | Temp 97.0°F | Resp 16

## 2015-10-04 DIAGNOSIS — D45 Polycythemia vera: Secondary | ICD-10-CM

## 2015-10-04 LAB — CBC WITH DIFFERENTIAL/PLATELET
BASO%: 0.4 % (ref 0.0–2.0)
Basophils Absolute: 0 10*3/uL (ref 0.0–0.1)
EOS ABS: 0.3 10*3/uL (ref 0.0–0.5)
EOS%: 5.6 % (ref 0.0–7.0)
HEMATOCRIT: 49.1 % (ref 38.4–49.9)
HGB: 16.1 g/dL (ref 13.0–17.1)
LYMPH%: 44.2 % (ref 14.0–49.0)
MCH: 30.5 pg (ref 27.2–33.4)
MCHC: 32.8 g/dL (ref 32.0–36.0)
MCV: 93 fL (ref 79.3–98.0)
MONO#: 0.5 10*3/uL (ref 0.1–0.9)
MONO%: 10 % (ref 0.0–14.0)
NEUT%: 39.8 % (ref 39.0–75.0)
NEUTROS ABS: 2 10*3/uL (ref 1.5–6.5)
NRBC: 0 % (ref 0–0)
PLATELETS: 146 10*3/uL (ref 140–400)
RBC: 5.28 10*6/uL (ref 4.20–5.82)
RDW: 13.9 % (ref 11.0–14.6)
WBC: 5 10*3/uL (ref 4.0–10.3)
lymph#: 2.2 10*3/uL (ref 0.9–3.3)

## 2015-10-04 NOTE — Patient Instructions (Signed)
Therapeutic Phlebotomy, Care After  Refer to this sheet in the next few weeks. These instructions provide you with information about caring for yourself after your procedure. Your health care provider may also give you more specific instructions. Your treatment has been planned according to current medical practices, but problems sometimes occur. Call your health care provider if you have any problems or questions after your procedure.  WHAT TO EXPECT AFTER THE PROCEDURE  After your procedure, it is common to have:   Light-headedness or dizziness. You may feel faint.   Nausea.   Tiredness.  HOME CARE INSTRUCTIONS  Activities   Return to your normal activities as directed by your health care provider. Most people can go back to their normal activities right away.   Avoid strenuous physical activity and heavy lifting or pulling for about 5 hours after the procedure. Do not lift anything that is heavier than 10 lb (4.5 kg).   Athletes should avoid strenuous exercise for at least 12 hours.   Change positions slowly for the remainder of the day. This will help to prevent light-headedness or fainting.   If you feel light-headed, lie down until the feeling goes away.  Eating and Drinking   Be sure to eat well-balanced meals for the next 24 hours.   Drink enough fluid to keep your urine clear or pale yellow.   Avoid drinking alcohol on the day that you had the procedure.  Care of the Needle Insertion Site   Keep your bandage dry. You can remove the bandage after about 5 hours or as directed by your health care provider.   If you have bleeding from the needle insertion site, elevate your arm and press firmly on the site until the bleeding stops.   If you have bruising at the site, apply ice to the area:   Put ice in a plastic bag.   Place a towel between your skin and the bag.   Leave the ice on for 20 minutes, 2-3 times a day for the first 24 hours.   If the swelling does not go away after 24 hours, apply  a warm, moist washcloth to the area for 20 minutes, 2-3 times a day.  General Instructions   Avoid smoking for at least 30 minutes after the procedure.   Keep all follow-up visits as directed by your health care provider. It is important to continue with further therapeutic phlebotomy treatments as directed.  SEEK MEDICAL CARE IF:   You have redness, swelling, or pain at the needle insertion site.   You have fluid, blood, or pus coming from the needle insertion site.   You feel light-headed, dizzy, or nauseated, and the feeling does not go away.   You notice new bruising at the needle insertion site.   You feel weaker than normal.   You have a fever or chills.  SEEK IMMEDIATE MEDICAL CARE IF:   You have severe nausea or vomiting.   You have chest pain.   You have trouble breathing.    This information is not intended to replace advice given to you by your health care provider. Make sure you discuss any questions you have with your health care provider.    Document Released: 06/17/2010 Document Revised: 05/30/2014 Document Reviewed: 01/09/2014  Elsevier Interactive Patient Education 2016 [employer]

## 2015-10-04 NOTE — Progress Notes (Signed)
Patient had not eaten or had anything to drink today.  Gave patient ham sandwich and diet coke to drink before PHL.    Hct 49.1 today.  Appropriate for PHL

## 2015-11-01 ENCOUNTER — Ambulatory Visit (HOSPITAL_BASED_OUTPATIENT_CLINIC_OR_DEPARTMENT_OTHER): Payer: Self-pay

## 2015-11-01 ENCOUNTER — Other Ambulatory Visit (HOSPITAL_BASED_OUTPATIENT_CLINIC_OR_DEPARTMENT_OTHER): Payer: Self-pay

## 2015-11-01 ENCOUNTER — Ambulatory Visit (HOSPITAL_BASED_OUTPATIENT_CLINIC_OR_DEPARTMENT_OTHER): Payer: Self-pay | Admitting: Oncology

## 2015-11-01 ENCOUNTER — Telehealth: Payer: Self-pay | Admitting: Oncology

## 2015-11-01 VITALS — BP 156/88 | HR 88 | Temp 97.8°F | Resp 18 | Ht 67.0 in | Wt 175.7 lb

## 2015-11-01 VITALS — BP 142/73 | HR 88 | Temp 97.5°F | Resp 16

## 2015-11-01 DIAGNOSIS — D751 Secondary polycythemia: Secondary | ICD-10-CM

## 2015-11-01 DIAGNOSIS — D45 Polycythemia vera: Secondary | ICD-10-CM

## 2015-11-01 LAB — CBC WITH DIFFERENTIAL/PLATELET
BASO%: 0.7 % (ref 0.0–2.0)
Basophils Absolute: 0 10*3/uL (ref 0.0–0.1)
EOS%: 7.9 % — AB (ref 0.0–7.0)
Eosinophils Absolute: 0.4 10*3/uL (ref 0.0–0.5)
HCT: 50.1 % — ABNORMAL HIGH (ref 38.4–49.9)
HEMOGLOBIN: 16.7 g/dL (ref 13.0–17.1)
LYMPH#: 2.5 10*3/uL (ref 0.9–3.3)
LYMPH%: 55.5 % — ABNORMAL HIGH (ref 14.0–49.0)
MCH: 30.4 pg (ref 27.2–33.4)
MCHC: 33.3 g/dL (ref 32.0–36.0)
MCV: 91.1 fL (ref 79.3–98.0)
MONO#: 0.4 10*3/uL (ref 0.1–0.9)
MONO%: 9.5 % (ref 0.0–14.0)
NEUT#: 1.2 10*3/uL — ABNORMAL LOW (ref 1.5–6.5)
NEUT%: 26.4 % — AB (ref 39.0–75.0)
Platelets: 140 10*3/uL (ref 140–400)
RBC: 5.5 10*6/uL (ref 4.20–5.82)
RDW: 13.4 % (ref 11.0–14.6)
WBC: 4.5 10*3/uL (ref 4.0–10.3)
nRBC: 0 % (ref 0–0)

## 2015-11-01 NOTE — Patient Instructions (Signed)

## 2015-11-01 NOTE — Telephone Encounter (Signed)
Pt reviewed appt dates, requesting Weds appt. Received avs

## 2015-11-01 NOTE — Progress Notes (Signed)
Hematology and Oncology Follow Up Visit  Eric Morton [id number] [birth year] 50 y.o. 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 His polycythemia is related to smoking and has negative JAK-2 mutation.   Prior Therapy: Intermittent phlebotomy.  Current therapy: Phlebotomy on a monthly basis to keep his hematocrit less than 45.  Interim History:  Eric Morton presents today for a follow-up visit. Since the last visit, he continues to do very well. He tolerates a phlebotomy intermittently without any major complications. He denied any dizziness, falls or syncope. He denied any recent hospitalization or illnesses. He denied any headaches. He denied any thrombotic or bleeding episodes. He continues to smoke periodically but have cut down close to 5 cigarettes a day. He denied any constitutional symptoms of weight loss or appetite changes.  He denies any headaches, blurry vision, syncope or seizures. He does not report any fevers, chills or sweats. Does not report any cough, wheezing or hemoptysis. Does not report any nausea, vomiting or abdominal pain. Does not report any frequency urgency or hesitancy. Does not report any skeletal complaints. Remaining review of systems unremarkable.   Medications: I have reviewed the patient's current medications.  Current Outpatient Prescriptions  Medication Sig Dispense Refill  . aspirin EC 81 MG tablet Take 1 tablet (81 mg total) by mouth daily. 30 tablet 2  . atorvastatin (LIPITOR) 40 MG tablet Take 1 tablet (40 mg total) by mouth daily. 30 tablet 3  . folic acid (FOLVITE) 1 MG tablet Take 1 tablet (1 mg total) by mouth daily. 30 tablet 1  . gabapentin (NEURONTIN) 300 MG capsule Take 2 capsules (600 mg total) by mouth 2 (two) times daily. 120 capsule 3  . lisinopril (PRINIVIL,ZESTRIL) 20 MG tablet Take 1 tablet (20 mg total) by mouth daily.  30 tablet 3  . metFORMIN (GLUCOPHAGE) 1000 MG tablet Take 1 tablet (1,000 mg total) by mouth 2 (two) times daily with a meal. 60 tablet 3  . pantoprazole (PROTONIX) 40 MG tablet Take 1 tablet (40 mg total) by mouth daily at 6 (six) AM. 30 tablet 0  . thiamine 100 MG tablet Take 1 tablet (100 mg total) by mouth daily. 30 tablet 2   No current facility-administered medications for this visit.      Allergies: No Known Allergies  Past Medical History, Surgical history, Social history, and Family History were reviewed and updated.  Review of system was discussed in the history of present illness.  Physical Exam: Blood pressure (!) 156/88, pulse 88, temperature 97.8 F (36.6 C), temperature source Oral, resp. rate 18, height 5\' 7"  (1.702 m), weight 175 lb 11.2 oz (79.7 kg), SpO2 100 %. ECOG: 1 General appearance: alert and cooperative appeared without distress. Head: Normocephalic, without obvious abnormality normal ulcers or lesions. Neck: no adenopathy Lymph nodes: Cervical, supraclavicular, and axillary nodes normal. Heart:regular rate and rhythm, S1, S2 normal, no murmur, click, rub or gallop Lung:chest clear, no wheezing, rales, normal symmetric air entry Abdomin: soft, non-tender, without masses or organomegaly no shifting dullness or ascites. EXT:no erythema, induration, or nodules   Lab Results: Lab Results  Component Value Date   WBC 4.5 11/01/2015   HGB 16.7 11/01/2015   HCT 50.1 (H) 11/01/2015   MCV 91.1 11/01/2015   PLT 140 11/01/2015     Chemistry      Component Value Date/Time   NA 135 03/20/2015 1536  K 4.6 03/20/2015 1536   CL 101 03/20/2015 1536   CO2 24 03/20/2015 1536   BUN 6 03/20/2015 1536   CREATININE 1.04 03/20/2015 1536   CREATININE 0.86 05/04/2014 1208      Component Value Date/Time   CALCIUM 9.0 03/20/2015 1536   ALKPHOS 88 03/20/2015 1536   AST 26 03/20/2015 1536   ALT 19 03/20/2015 1536   BILITOT 0.9 03/20/2015 1536       Impression  and Plan:  50 year old gentleman with the following issues:  1. Polycythemia: He had a long-standing history dating back to at least 2007 and have had intermittent phlebotomies at that time. The etiology of his polycythemia Is related to secondary causes unlikely to have a smoking history. His erythropoietin level and JAK 2 mutation do not support the diagnosis of polycythemia vera.  He continues to tolerate phlebotomy well and functions better with improved hemoglobin. The plan is to continue with phlebotomy on a monthly basis to keep his hematocrit less than 50. He will receive phlebotomy today and will have chronic visit in 4 months.  2. Thrombosis prophylaxis: He is currently on aspirin which she will continue indefinitely.  3. Follow-up: Will be monthly to check his CBC and a potential phlebotomy. He will have a clinical visit in 4 months.     [id number], MD 10/5/201710:05 AM

## 2015-12-05 ENCOUNTER — Other Ambulatory Visit: Payer: Self-pay

## 2015-12-05 ENCOUNTER — Telehealth: Payer: Self-pay | Admitting: *Deleted

## 2015-12-05 ENCOUNTER — Telehealth: Payer: Self-pay | Admitting: Oncology

## 2015-12-05 NOTE — Telephone Encounter (Signed)
11/8 Appointments rescheduled per patient request.

## 2015-12-05 NOTE — Telephone Encounter (Signed)
Per staff message I have moved appt from today to 11/15.

## 2015-12-12 ENCOUNTER — Other Ambulatory Visit (HOSPITAL_BASED_OUTPATIENT_CLINIC_OR_DEPARTMENT_OTHER): Payer: Self-pay

## 2015-12-12 ENCOUNTER — Telehealth: Payer: Self-pay | Admitting: Oncology

## 2015-12-12 ENCOUNTER — Ambulatory Visit (HOSPITAL_BASED_OUTPATIENT_CLINIC_OR_DEPARTMENT_OTHER): Payer: No Typology Code available for payment source

## 2015-12-12 VITALS — BP 103/65 | HR 99 | Temp 98.2°F | Resp 18

## 2015-12-12 DIAGNOSIS — D45 Polycythemia vera: Secondary | ICD-10-CM

## 2015-12-12 LAB — CBC WITH DIFFERENTIAL/PLATELET
BASO%: 0.4 % (ref 0.0–2.0)
Basophils Absolute: 0 10*3/uL (ref 0.0–0.1)
EOS%: 5.2 % (ref 0.0–7.0)
Eosinophils Absolute: 0.3 10*3/uL (ref 0.0–0.5)
HCT: 47.4 % (ref 38.4–49.9)
HGB: 15.5 g/dL (ref 13.0–17.1)
LYMPH%: 37.1 % (ref 14.0–49.0)
MCH: 28.8 pg (ref 27.2–33.4)
MCHC: 32.7 g/dL (ref 32.0–36.0)
MCV: 87.9 fL (ref 79.3–98.0)
MONO#: 0.6 10*3/uL (ref 0.1–0.9)
MONO%: 10.8 % (ref 0.0–14.0)
NEUT%: 46.5 % (ref 39.0–75.0)
NEUTROS ABS: 2.4 10*3/uL (ref 1.5–6.5)
Platelets: 143 10*3/uL (ref 140–400)
RBC: 5.39 10*6/uL (ref 4.20–5.82)
RDW: 12.9 % (ref 11.0–14.6)
WBC: 5.2 10*3/uL (ref 4.0–10.3)
lymph#: 1.9 10*3/uL (ref 0.9–3.3)

## 2015-12-12 NOTE — Progress Notes (Signed)
Therapeutic phlebotomy completed removing 500g over 15 minutes. Patient tolerated procedure well. He was given nourishments and states no complaints at time of discharge.

## 2015-12-12 NOTE — Patient Instructions (Signed)

## 2015-12-12 NOTE — Telephone Encounter (Signed)
Patient stopped by scheduling today for schedule. Due to patient rescheduling 11/8 lab/phleb to 11/15 remaining appointments adjusted by one week. Patient aware and given new schedule for dec thru feb.

## 2015-12-12 NOTE — Progress Notes (Signed)
Clarified Phlebotomy parameters with [facility], pt to receive therapeutic phlebotomy today.

## 2016-01-01 ENCOUNTER — Telehealth: Payer: Self-pay | Admitting: *Deleted

## 2016-01-01 NOTE — Telephone Encounter (Signed)
"  I've left message asking for appointment  Information because I lost my sheet.  Can y ou help me?"  Provided appointment for next Wednesday 01-09-2016 starting at 11:15 am.  Can obtain new schedule when arrives.  Advised come early for registration process.

## 2016-01-02 ENCOUNTER — Other Ambulatory Visit: Payer: Self-pay

## 2016-01-09 ENCOUNTER — Other Ambulatory Visit: Payer: Self-pay

## 2016-01-10 ENCOUNTER — Telehealth: Payer: Self-pay

## 2016-01-10 NOTE — Telephone Encounter (Signed)
Pt requested a call back. He is wanting to r/s his missed lab and phlebotomy appt of 12/13.  He was "under the weather with a cold". In basket sent.

## 2016-01-14 ENCOUNTER — Telehealth: Payer: Self-pay | Admitting: Oncology

## 2016-01-14 NOTE — Telephone Encounter (Signed)
lvm to inform pt of r/s lab/phleotmy appt per LOS

## 2016-01-23 ENCOUNTER — Telehealth: Payer: Self-pay | Admitting: Oncology

## 2016-01-23 ENCOUNTER — Other Ambulatory Visit: Payer: Self-pay

## 2016-01-23 NOTE — Telephone Encounter (Signed)
sw pt mom to inform her of r/s appt to Feb 01 2016 at 10 am per LOS

## 2016-01-30 ENCOUNTER — Other Ambulatory Visit: Payer: Self-pay

## 2016-02-01 ENCOUNTER — Other Ambulatory Visit (HOSPITAL_BASED_OUTPATIENT_CLINIC_OR_DEPARTMENT_OTHER): Payer: No Typology Code available for payment source

## 2016-02-01 ENCOUNTER — Other Ambulatory Visit: Payer: Self-pay | Admitting: *Deleted

## 2016-02-01 ENCOUNTER — Ambulatory Visit (HOSPITAL_BASED_OUTPATIENT_CLINIC_OR_DEPARTMENT_OTHER): Payer: No Typology Code available for payment source

## 2016-02-01 DIAGNOSIS — D45 Polycythemia vera: Secondary | ICD-10-CM

## 2016-02-01 LAB — CBC WITH DIFFERENTIAL/PLATELET
BASO%: 0.2 % (ref 0.0–2.0)
Basophils Absolute: 0 10*3/uL (ref 0.0–0.1)
EOS%: 6.1 % (ref 0.0–7.0)
Eosinophils Absolute: 0.3 10*3/uL (ref 0.0–0.5)
HCT: 45.8 % (ref 38.4–49.9)
HGB: 15 g/dL (ref 13.0–17.1)
LYMPH%: 51.8 % — ABNORMAL HIGH (ref 14.0–49.0)
MCH: 27 pg — ABNORMAL LOW (ref 27.2–33.4)
MCHC: 32.8 g/dL (ref 32.0–36.0)
MCV: 82.5 fL (ref 79.3–98.0)
MONO#: 0.4 10*3/uL (ref 0.1–0.9)
MONO%: 9 % (ref 0.0–14.0)
NEUT#: 1.5 10*3/uL (ref 1.5–6.5)
NEUT%: 32.9 % — ABNORMAL LOW (ref 39.0–75.0)
Platelets: 142 10*3/uL (ref 140–400)
RBC: 5.55 10*6/uL (ref 4.20–5.82)
RDW: 14.2 % (ref 11.0–14.6)
WBC: 4.4 10*3/uL (ref 4.0–10.3)
lymph#: 2.3 10*3/uL (ref 0.9–3.3)
nRBC: 0 % (ref 0–0)

## 2016-02-01 NOTE — Progress Notes (Signed)
Patient presented to tx area with HCT 45.8 and elevated BP. [place], [place] for clarification on phlebotomy parameters and to notify of BP. Per Erline Levine, Dr. Alen Blew aware of both and Dr. Alen Blew advised to hold phlebotomy for today, do not treat BP, and instruct patient to pick up antihypertensive today as planned. Patient verbalizes understanding. Patient discharged in no acute distress.

## 2016-02-06 ENCOUNTER — Other Ambulatory Visit: Payer: Self-pay

## 2016-02-29 ENCOUNTER — Other Ambulatory Visit: Payer: Self-pay

## 2016-02-29 ENCOUNTER — Ambulatory Visit: Payer: Self-pay | Admitting: Oncology

## 2016-03-05 ENCOUNTER — Other Ambulatory Visit: Payer: Self-pay

## 2016-03-05 ENCOUNTER — Ambulatory Visit: Payer: Self-pay | Admitting: Oncology

## 2016-06-06 ENCOUNTER — Encounter: Payer: Self-pay | Admitting: Family Medicine

## 2016-06-06 ENCOUNTER — Ambulatory Visit: Payer: Self-pay | Attending: Family Medicine | Admitting: Family Medicine

## 2016-06-06 VITALS — BP 146/83 | HR 82 | Temp 97.8°F | Resp 18 | Ht 67.0 in | Wt 176.0 lb

## 2016-06-06 DIAGNOSIS — I129 Hypertensive chronic kidney disease with stage 1 through stage 4 chronic kidney disease, or unspecified chronic kidney disease: Secondary | ICD-10-CM | POA: Insufficient documentation

## 2016-06-06 DIAGNOSIS — D45 Polycythemia vera: Secondary | ICD-10-CM

## 2016-06-06 DIAGNOSIS — E1142 Type 2 diabetes mellitus with diabetic polyneuropathy: Secondary | ICD-10-CM

## 2016-06-06 DIAGNOSIS — E1122 Type 2 diabetes mellitus with diabetic chronic kidney disease: Secondary | ICD-10-CM | POA: Insufficient documentation

## 2016-06-06 DIAGNOSIS — N189 Chronic kidney disease, unspecified: Secondary | ICD-10-CM | POA: Insufficient documentation

## 2016-06-06 DIAGNOSIS — Z79899 Other long term (current) drug therapy: Secondary | ICD-10-CM | POA: Insufficient documentation

## 2016-06-06 DIAGNOSIS — E7800 Pure hypercholesterolemia, unspecified: Secondary | ICD-10-CM

## 2016-06-06 DIAGNOSIS — E1159 Type 2 diabetes mellitus with other circulatory complications: Secondary | ICD-10-CM

## 2016-06-06 DIAGNOSIS — Z7982 Long term (current) use of aspirin: Secondary | ICD-10-CM | POA: Insufficient documentation

## 2016-06-06 DIAGNOSIS — Z7984 Long term (current) use of oral hypoglycemic drugs: Secondary | ICD-10-CM | POA: Insufficient documentation

## 2016-06-06 DIAGNOSIS — Z9114 Patient's other noncompliance with medication regimen: Secondary | ICD-10-CM | POA: Insufficient documentation

## 2016-06-06 DIAGNOSIS — Z8673 Personal history of transient ischemic attack (TIA), and cerebral infarction without residual deficits: Secondary | ICD-10-CM | POA: Insufficient documentation

## 2016-06-06 DIAGNOSIS — I10 Essential (primary) hypertension: Secondary | ICD-10-CM

## 2016-06-06 LAB — GLUCOSE, POCT (MANUAL RESULT ENTRY): POC Glucose: 236 mg/dl — AB (ref 70–99)

## 2016-06-06 LAB — POCT GLYCOSYLATED HEMOGLOBIN (HGB A1C): HEMOGLOBIN A1C: 11.2

## 2016-06-06 MED ORDER — SIMVASTATIN 40 MG PO TABS: 40.0000 mg | ORAL_TABLET | Freq: Every day | ORAL | 5 refills | Status: DC

## 2016-06-06 MED ORDER — LISINOPRIL 20 MG PO TABS: 20.0000 mg | ORAL_TABLET | Freq: Every day | ORAL | 5 refills | Status: DC

## 2016-06-06 MED ORDER — METFORMIN HCL 1000 MG PO TABS: 1000.0000 mg | ORAL_TABLET | Freq: Two times a day (BID) | ORAL | 5 refills | Status: DC

## 2016-06-06 MED ORDER — GLIPIZIDE 5 MG PO TABS: 5.0000 mg | ORAL_TABLET | Freq: Two times a day (BID) | ORAL | 5 refills | Status: DC

## 2016-06-06 MED ORDER — ASPIRIN EC 81 MG PO TBEC: 81.0000 mg | DELAYED_RELEASE_TABLET | Freq: Every day | ORAL | 2 refills | Status: DC

## 2016-06-06 MED ORDER — GABAPENTIN 300 MG PO CAPS: 600.0000 mg | ORAL_CAPSULE | Freq: Two times a day (BID) | ORAL | 5 refills | Status: DC

## 2016-06-06 MED FILL — GABAPENTIN 300 MG CAPSULE: 300 | 30 days supply | Qty: 120 | Fill #0

## 2016-06-06 MED FILL — glipiZIDE 5 MG TABS: 5 | 30 days supply | Qty: 60 | Fill #0

## 2016-06-06 MED FILL — LISINOPRIL 20 MG TABLET: 20 | 30 days supply | Qty: 30 | Fill #0

## 2016-06-06 MED FILL — SIMVASTATIN 40 MG TABLET: 40 | 30 days supply | Qty: 30 | Fill #0

## 2016-06-06 MED FILL — metFORMIN HCL 1000 MG TABS: 1000 | 30 days supply | Qty: 60 | Fill #0

## 2016-06-06 NOTE — Patient Instructions (Signed)
Diabetes Mellitus and Food It is important for you to manage your blood sugar (glucose) level. Your blood glucose level can be greatly affected by what you eat. Eating healthier foods in the appropriate amounts throughout the day at about the same time each day will help you control your blood glucose level. It can also help slow or prevent worsening of your diabetes mellitus. Healthy eating may even help you improve the level of your blood pressure and reach or maintain a healthy weight. General recommendations for healthful eating and cooking habits include:  Eating meals and snacks regularly. Avoid going long periods of time without eating to lose weight.  Eating a diet that consists mainly of plant-based foods, such as fruits, vegetables, nuts, legumes, and whole grains.  Using low-heat cooking methods, such as baking, instead of high-heat cooking methods, such as deep frying.  Work with your dietitian to make sure you understand how to use the Nutrition Facts information on food labels. How can food affect me? Carbohydrates Carbohydrates affect your blood glucose level more than any other type of food. Your dietitian will help you determine how many carbohydrates to eat at each meal and teach you how to count carbohydrates. Counting carbohydrates is important to keep your blood glucose at a healthy level, especially if you are using insulin or taking certain medicines for diabetes mellitus. Alcohol Alcohol can cause sudden decreases in blood glucose (hypoglycemia), especially if you use insulin or take certain medicines for diabetes mellitus. Hypoglycemia can be a life-threatening condition. Symptoms of hypoglycemia (sleepiness, dizziness, and disorientation) are similar to symptoms of having too much alcohol. If your health care provider has given you approval to drink alcohol, do so in moderation and use the following guidelines:  Women should not have more than one drink per day, and men  should not have more than two drinks per day. One drink is equal to: ? 12 oz of beer. ? 5 oz of wine. ? 1 oz of hard liquor.  Do not drink on an empty stomach.  Keep yourself hydrated. Have water, diet soda, or unsweetened iced tea.  Regular soda, juice, and other mixers might contain a lot of carbohydrates and should be counted.  What foods are not recommended? As you make food choices, it is important to remember that all foods are not the same. Some foods have fewer nutrients per serving than other foods, even though they might have the same number of calories or carbohydrates. It is difficult to get your body what it needs when you eat foods with fewer nutrients. Examples of foods that you should avoid that are high in calories and carbohydrates but low in nutrients include:  Trans fats (most processed foods list trans fats on the Nutrition Facts label).  Regular soda.  Juice.  Candy.  Sweets, such as cake, pie, doughnuts, and cookies.  Fried foods.  What foods can I eat? Eat nutrient-rich foods, which will nourish your body and keep you healthy. The food you should eat also will depend on several factors, including:  The calories you need.  The medicines you take.  Your weight.  Your blood glucose level.  Your blood pressure level.  Your cholesterol level.  You should eat a variety of foods, including:  Protein. ? Lean cuts of meat. ? Proteins low in saturated fats, such as fish, egg whites, and beans. Avoid processed meats.  Fruits and vegetables. ? Fruits and vegetables that may help control blood glucose levels, such as apples,   mangoes, and yams.  Dairy products. ? Choose fat-free or low-fat dairy products, such as milk, yogurt, and cheese.  Grains, bread, pasta, and rice. ? Choose whole grain products, such as multigrain bread, whole oats, and brown rice. These foods may help control blood pressure.  Fats. ? Foods containing healthful fats, such as  nuts, avocado, olive oil, canola oil, and fish.  Does everyone with diabetes mellitus have the same meal plan? Because every person with diabetes mellitus is different, there is not one meal plan that works for everyone. It is very important that you meet with a dietitian who will help you create a meal plan that is just right for you. This information is not intended to replace advice given to you by your health care provider. Make sure you discuss any questions you have with your health care provider. Document Released: 10/10/2004 Document Revised: 06/21/2015 Document Reviewed: 12/10/2012 Elsevier Interactive Patient Education  2017 Elsevier Inc.  

## 2016-06-06 NOTE — Progress Notes (Signed)
Patient is here for HTN/DM  Patient denies pain at this time.  Patient has not taken medication today and has been out for 6/7 months. Patient has not eaten today.  Patient request refills and referral to the cancer center to have blood drawn monthly for excessive blood count.

## 2016-06-06 NOTE — Progress Notes (Signed)
Subjective:  Patient ID: Eric Morton, male    DOB: Jun 12, 1965  Age: 51 y.o. MRN: [id number]  CC: Diabetes   HPI Eric Morton is a 51 year old male with history of polycythemia vera, type 2 diabetes mellitus (A1c 11.2 up from 8.1 previously ), hyperlipidemia, diabetic neuropathy, previous history of stroke who comes into the clinic for a follow-up visit; last office visit was on 06/2015   He underwent monthly phlebotomy at the cancer center for his polycythemia vera but has missed several appointments lately and also informed me that one appointment was canceled due to elevated blood pressure. He has not been compliant with his antihypertensives or his other medications  Complains of atorvastatin affecting his vision and also causing left mastitis and so he discontinued it. He would like to be placed on some other statin.  He has not been checking his blood sugars lately but denies hypoglycemic symptoms. Does have diabetic neuropathy which is controlled on gabapentin. Has no other complaints today.   Past Medical History:  Diagnosis Date  . 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 Anxiety   . Chronic kidney disease    "jsut a little damage"  . History of stomach ulcers    not confirmed, never underwent diagnostic endo or radiology, this manifested as epigastric distress.   [name] Hx of adenomatous colonic polyps   . Hypercholesterolemia   . Hypertension   . Peripheral neuropathy   . Polycythemia vera ([place]) 2006  . Substance abuse   . Type II diabetes mellitus ([place])     Past Surgical History:  Procedure Laterality Date  . COLONOSCOPY WITH PROPOFOL N/A 04/28/2014   Procedure: COLONOSCOPY WITH PROPOFOL;  Surgeon: Gatha Mayer, MD;  Location: [facility];  Service: Endoscopy;  Laterality: N/A;  . IRRIGATION AND DEBRIDEMENT ABSCESS  09/26/2011   Procedure: IRRIGATION AND DEBRIDEMENT ABSCESS;  Surgeon: Imogene Burn. Georgette Dover, MD;  Location: [facility];  Service: General;   Laterality: Left;  Incision and Drainage left breast abscess    No Known Allergies   Outpatient Medications Prior to Visit  Medication Sig Dispense Refill  . folic acid (FOLVITE) 1 MG tablet Take 1 tablet (1 mg total) by mouth daily. 30 tablet 1  . pantoprazole (PROTONIX) 40 MG tablet Take 1 tablet (40 mg total) by mouth daily at 6 (six) AM. 30 tablet 0  . thiamine 100 MG tablet Take 1 tablet (100 mg total) by mouth daily. 30 tablet 2  . aspirin EC 81 MG tablet Take 1 tablet (81 mg total) by mouth daily. 30 tablet 2  . gabapentin (NEURONTIN) 300 MG capsule Take 2 capsules (600 mg total) by mouth 2 (two) times daily. 120 capsule 3  . lisinopril (PRINIVIL,ZESTRIL) 20 MG tablet Take 1 tablet (20 mg total) by mouth daily. 30 tablet 3  . metFORMIN (GLUCOPHAGE) 1000 MG tablet Take 1 tablet (1,000 mg total) by mouth 2 (two) times daily with a meal. 60 tablet 3  . atorvastatin (LIPITOR) 40 MG tablet Take 1 tablet (40 mg total) by mouth daily. (Patient not taking: Reported on 06/06/2016) 30 tablet 3   No facility-administered medications prior to visit.     ROS Review of Systems  Constitutional: Negative for activity change and appetite change.  HENT: Negative for sinus pressure and sore throat.   Eyes: Negative for visual disturbance.  Respiratory: Negative for cough, chest tightness and shortness of breath.   Cardiovascular: Negative for chest pain and leg  swelling.  Gastrointestinal: Negative for abdominal distention, abdominal pain, constipation and diarrhea.  Endocrine: Negative.   Genitourinary: Negative for dysuria.  Musculoskeletal: Negative for joint swelling and myalgias.  Skin: Negative for rash.  Allergic/Immunologic: Negative.   Neurological: Negative for weakness, light-headedness and numbness.  Psychiatric/Behavioral: Negative for dysphoric mood and suicidal ideas.    Objective:  BP (!) 146/83 (BP Location: Right Arm, Patient Position: Sitting, Cuff Size: Normal)   Pulse  82   Temp 97.8 F (36.6 C) (Oral)   Resp 18   Ht _0  (1.702 m)   Wt 176 lb (79.8 kg)   SpO2 100%   BMI 27.57 kg/m   BP/Weight 06/06/2016 02/01/2016 56/43/3295  Systolic BP 188 416 606  Diastolic BP 83 301 65  Wt. (Lbs) 176 - -  BMI 27.57 - -      Physical Exam Constitutional: He is oriented to person, place, and time. He appears well-developed and well-nourished.  Cardiovascular: Normal rate, normal heart sounds and intact distal pulses.   No murmur heard. Pulmonary/Chest: Effort normal and breath sounds normal. He has no wheezes. He has no rales. He exhibits no tenderness.  Abdominal: Soft. Bowel sounds are normal. He exhibits no distension and no mass. There is no tenderness.  Musculoskeletal: Normal range of motion.  Neurological: He is alert and oriented to person, place, and time.  Skin: Skin is warm and dry.  Psychiatric: He has a normal mood and affect.   Lab Results  Component Value Date   HGBA1C 11.2 06/06/2016    CMP Latest Ref Rng & Units 03/20/2015 05/04/2014 04/28/2014  Glucose 65 - 99 mg/dL 188(H) 92 74  BUN 6 - 20 mg/dL 6 6 <5(L)  Creatinine 0.61 - 1.24 mg/dL 1.04 0.86 0.89  Sodium 135 - 145 mmol/L 135 140 138  Potassium 3.5 - 5.1 mmol/L 4.6 4.3 3.3(L)  Chloride 101 - 111 mmol/L 101 102 111  CO2 22 - 32 mmol/L _1 Calcium 8.9 - 10.3 mg/dL 9.0 8.6 8.1(L)  Total Protein 6.5 - 8.1 g/dL 6.3(L) - 5.3(L)  Total Bilirubin 0.3 - 1.2 mg/dL 0.9 - 0.6  Alkaline Phos 38 - 126 U/L 88 - 107  AST 15 - 41 U/L 26 - 30  ALT 17 - 63 U/L 19 - 30    Lipid Panel     Component Value Date/Time   CHOL 219 (H) 03/21/2015 0339   TRIG 175 (H) 03/21/2015 0339   HDL 64 03/21/2015 0339   CHOLHDL 3.4 03/21/2015 0339   VLDL 35 03/21/2015 0339   LDLCALC 120 (H) 03/21/2015 0339     Assessment & Plan:   1. Type 2 diabetes mellitus with other circulatory complication, without long-term current use of insulin (HCC) Uncontrolled with A1c of 11.2 which has trended up from 8.1  last year due to noncompliance with medications Glipizide added to regimen And diet compliance with diabetic diet and lifestyle modifications - Glucose (CBG) - POCT UA - Microalbumin - metFORMIN (GLUCOPHAGE) 1000 MG tablet; Take 1 tablet (1,000 mg total) by mouth 2 (two) times daily with a meal.  Dispense: 60 tablet; Refill: 5 - CMP14+EGFR - Lipid panel - aspirin EC 81 MG tablet; Take 1 tablet (81 mg total) by mouth daily.  Dispense: 30 tablet; Refill: 2 - HgB A1c - glipiZIDE (GLUCOTROL) 5 MG tablet; Take 1 tablet (5 mg total) by mouth 2 (two) times daily before a meal.  Dispense: 60 tablet; Refill: 5  2. Pure hypercholesterolemia Switched from  atorvastatin to simvastatin as per patient request His lipids are elevated I will make no change to regimen as he has not been compliant with his statin - simvastatin (ZOCOR) 40 MG tablet; Take 1 tablet (40 mg total) by mouth at bedtime.  Dispense: 30 tablet; Refill: 5  3. Diabetic polyneuropathy associated with type 2 diabetes mellitus (HCC) Stable - gabapentin (NEURONTIN) 300 MG capsule; Take 2 capsules (600 mg total) by mouth 2 (two) times daily.  Dispense: 120 capsule; Refill: 5  4. Essential hypertension Uncontrolled due to running out of her antihypertensives which I have refilled - lisinopril (PRINIVIL,ZESTRIL) 20 MG tablet; Take 1 tablet (20 mg total) by mouth daily.  Dispense: 30 tablet; Refill: 5  5. Polycythemia vera ([place]) Patient advised to walk over to the cancer Center to schedule his missed appointments as he missed his appointment in 01/2016   Meds ordered this encounter  Medications  . gabapentin (NEURONTIN) 300 MG capsule    Sig: Take 2 capsules (600 mg total) by mouth 2 (two) times daily.    Dispense:  120 capsule    Refill:  5  . lisinopril (PRINIVIL,ZESTRIL) 20 MG tablet    Sig: Take 1 tablet (20 mg total) by mouth daily.    Dispense:  30 tablet    Refill:  5  . metFORMIN (GLUCOPHAGE) 1000 MG tablet    Sig: Take 1  tablet (1,000 mg total) by mouth 2 (two) times daily with a meal.    Dispense:  60 tablet    Refill:  5  . simvastatin (ZOCOR) 40 MG tablet    Sig: Take 1 tablet (40 mg total) by mouth at bedtime.    Dispense:  30 tablet    Refill:  5    Discontinue Lipitor  . aspirin EC 81 MG tablet    Sig: Take 1 tablet (81 mg total) by mouth daily.    Dispense:  30 tablet    Refill:  2  . glipiZIDE (GLUCOTROL) 5 MG tablet    Sig: Take 1 tablet (5 mg total) by mouth 2 (two) times daily before a meal.    Dispense:  60 tablet    Refill:  5    Follow-up: Return in about 3 months (around 09/06/2016) for Follow-up on diabetes mellitus.   Arnoldo Morale MD

## 2016-06-07 LAB — CMP14+EGFR
ALBUMIN: 3.8 g/dL (ref 3.5–5.5)
ALT: 13 IU/L (ref 0–44)
AST: 14 IU/L (ref 0–40)
Albumin/Globulin Ratio: 1.3 (ref 1.2–2.2)
Alkaline Phosphatase: 107 IU/L (ref 39–117)
BILIRUBIN TOTAL: 0.4 mg/dL (ref 0.0–1.2)
BUN / CREAT RATIO: 9 (ref 9–20)
BUN: 8 mg/dL (ref 6–24)
CO2: 26 mmol/L (ref 18–29)
CREATININE: 0.93 mg/dL (ref 0.76–1.27)
Calcium: 9.3 mg/dL (ref 8.7–10.2)
Chloride: 96 mmol/L (ref 96–106)
GFR calc Af Amer: 108 mL/min/{1.73_m2} (ref >59)
GFR, EST NON AFRICAN AMERICAN: 93 mL/min/{1.73_m2} (ref >59)
Globulin, Total: 3 g/dL (ref 1.5–4.5)
Glucose: 248 mg/dL — ABNORMAL HIGH (ref 65–99)
Potassium: 4.5 mmol/L (ref 3.5–5.2)
Sodium: 136 mmol/L (ref 134–144)
Total Protein: 6.8 g/dL (ref 6.0–8.5)

## 2016-06-07 LAB — LIPID PANEL
CHOL/HDL RATIO: 3.7 ratio (ref 0.0–5.0)
Cholesterol, Total: 236 mg/dL — ABNORMAL HIGH (ref 100–199)
HDL: 63 mg/dL (ref >39)
LDL Calculated: 129 mg/dL — ABNORMAL HIGH (ref 0–99)
Triglycerides: 219 mg/dL — ABNORMAL HIGH (ref 0–149)
VLDL Cholesterol Cal: 44 mg/dL — ABNORMAL HIGH (ref 5–40)

## 2016-06-09 ENCOUNTER — Telehealth: Payer: Self-pay | Admitting: *Deleted

## 2016-06-09 NOTE — Telephone Encounter (Signed)
-----   Message from Arnoldo Morale, MD sent at 06/09/2016 10:04 AM EDT ----- Blood work reveals abnormal cholesterol which could be due to noncompliance with his statin. Please encourage him to comply with the new cholesterol medication I had placed him on at his last visit. Also encourage compliance with a diabetic diet, exercise.

## 2016-06-09 NOTE — Telephone Encounter (Signed)
Medical Assistant left message on patient's home and cell voicemail. Voicemail states to give a call back to Orman Matsumura with [facility] at [phone].  

## 2016-06-16 ENCOUNTER — Telehealth: Payer: Self-pay | Admitting: *Deleted

## 2016-06-16 NOTE — Telephone Encounter (Signed)
"  I need to help. I restarted my blood pressure pills about five days ago.  I need to get set up with Dr. Alen Blew again to receive phlebotomy.  My calves are sore now so I think there's too much blood in me.  I also do not want to have another stroke.  Call me at [phone].  It's okay to leave a message with my aunt, Nelda Marseille who also is a patient there.  My blood pressure was checked recently at the [facility] center. (146/83)"

## 2016-06-17 IMAGING — CR DG CHEST 2V
2 series · 2 of 2 positions shown · non-contrast
Comparison: None.

CLINICAL DATA: Left hand and arm numbness today. Initial encounter.

EXAM:
CHEST  2 VIEW

[chest pa]
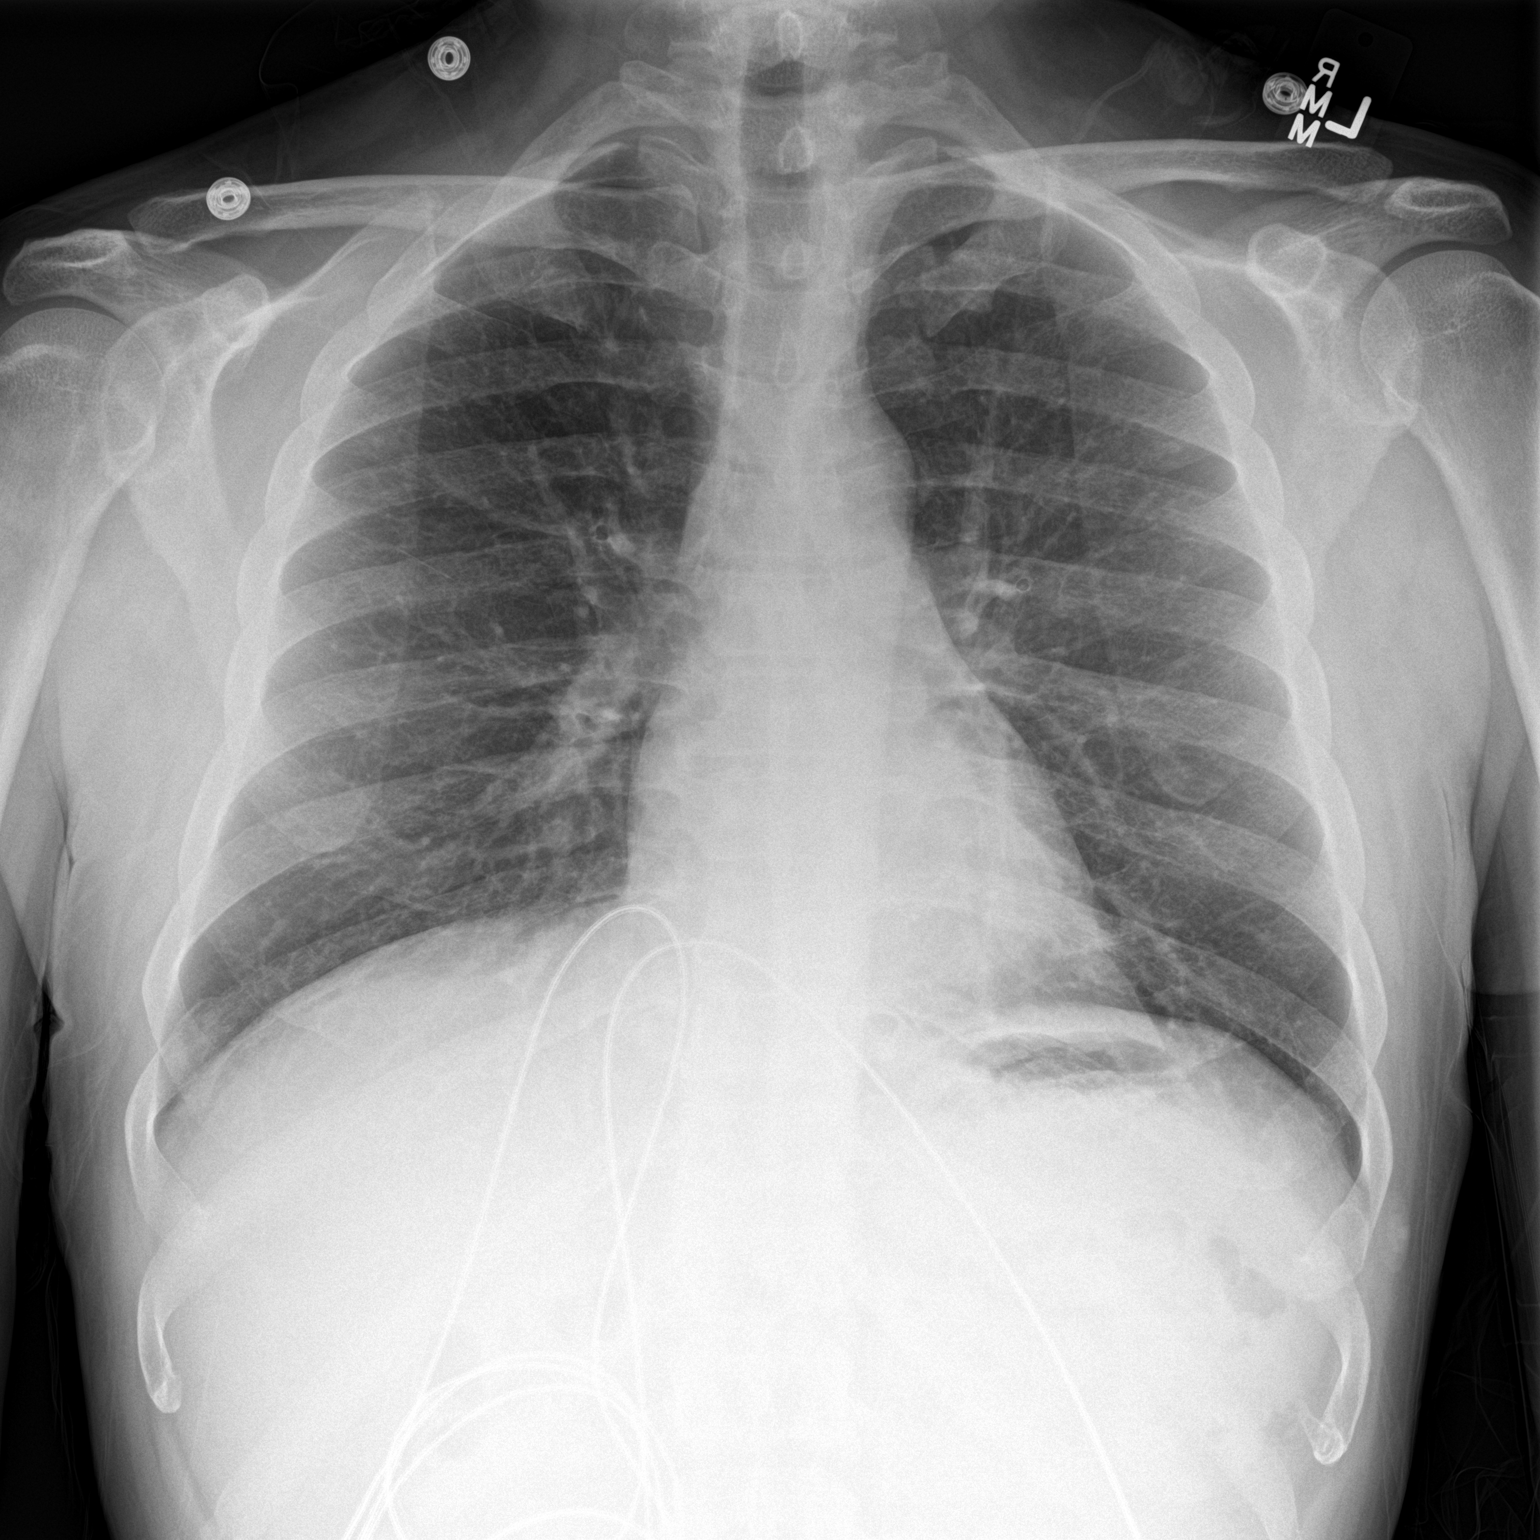

[chest lat]
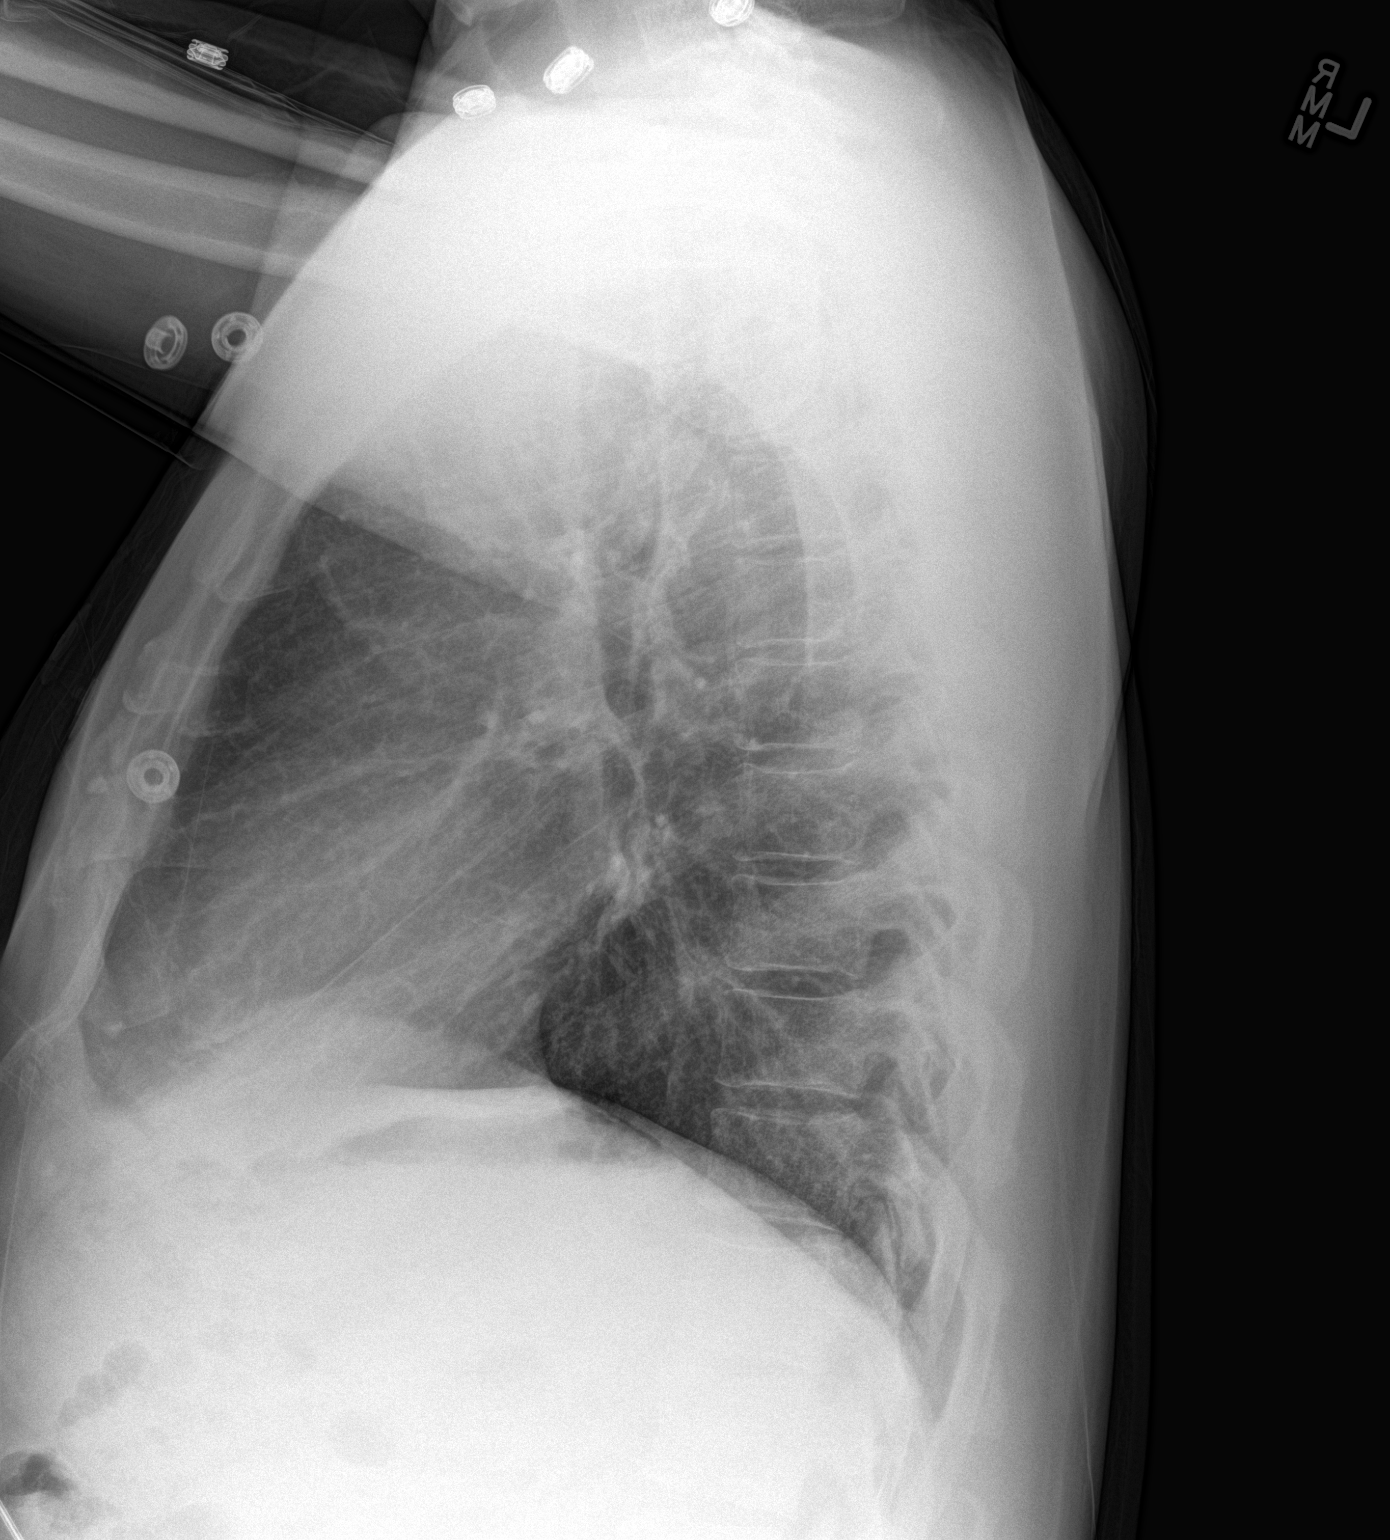

[2 of 2 positions shown; findings below may reference images not displayed]

FINDINGS: The heart size and mediastinal contours are normal. The lungs are
clear. There is no pleural effusion or pneumothorax. No acute
osseous findings are identified.
IMPRESSION: No active cardiopulmonary process.

## 2016-06-20 ENCOUNTER — Telehealth: Payer: Self-pay | Admitting: Oncology

## 2016-06-20 NOTE — Telephone Encounter (Signed)
Spoke with relative re 6/1 appointments.

## 2016-06-27 ENCOUNTER — Other Ambulatory Visit: Payer: Self-pay

## 2016-06-27 ENCOUNTER — Ambulatory Visit (HOSPITAL_BASED_OUTPATIENT_CLINIC_OR_DEPARTMENT_OTHER): Payer: No Typology Code available for payment source

## 2016-06-27 ENCOUNTER — Other Ambulatory Visit (HOSPITAL_BASED_OUTPATIENT_CLINIC_OR_DEPARTMENT_OTHER): Payer: No Typology Code available for payment source

## 2016-06-27 VITALS — BP 151/90 | HR 86 | Temp 97.7°F | Resp 18

## 2016-06-27 DIAGNOSIS — D45 Polycythemia vera: Secondary | ICD-10-CM

## 2016-06-27 LAB — CBC WITH DIFFERENTIAL/PLATELET
BASO%: 0.6 % (ref 0.0–2.0)
Basophils Absolute: 0 10*3/uL (ref 0.0–0.1)
EOS%: 5.7 % (ref 0.0–7.0)
Eosinophils Absolute: 0.3 10*3/uL (ref 0.0–0.5)
HEMATOCRIT: 53.2 % — AB (ref 38.4–49.9)
HGB: 17.5 g/dL — ABNORMAL HIGH (ref 13.0–17.1)
LYMPH%: 42.3 % (ref 14.0–49.0)
MCH: 28.8 pg (ref 27.2–33.4)
MCHC: 32.9 g/dL (ref 32.0–36.0)
MCV: 87.5 fL (ref 79.3–98.0)
MONO#: 0.5 10*3/uL (ref 0.1–0.9)
MONO%: 11 % (ref 0.0–14.0)
NEUT#: 1.9 10*3/uL (ref 1.5–6.5)
NEUT%: 40.4 % (ref 39.0–75.0)
PLATELETS: 150 10*3/uL (ref 140–400)
RBC: 6.08 10*6/uL — AB (ref 4.20–5.82)
RDW: 14.8 % — ABNORMAL HIGH (ref 11.0–14.6)
WBC: 4.7 10*3/uL (ref 4.0–10.3)
lymph#: 2 10*3/uL (ref 0.9–3.3)
nRBC: 0 % (ref 0–0)

## 2016-06-27 NOTE — Patient Instructions (Signed)

## 2016-06-30 ENCOUNTER — Telehealth: Payer: Self-pay | Admitting: *Deleted

## 2016-06-30 NOTE — Telephone Encounter (Signed)
"  I'm trying to get back on again with appointments to see Dr. Alen Blew.  Return number [phone]."

## 2016-07-04 ENCOUNTER — Telehealth: Payer: Self-pay | Admitting: *Deleted

## 2016-07-04 ENCOUNTER — Encounter: Payer: Self-pay | Admitting: *Deleted

## 2016-07-04 NOTE — Telephone Encounter (Signed)
Patient calling to get a lab, doctor visit and possible phlebotomy.

## 2016-07-07 ENCOUNTER — Other Ambulatory Visit: Payer: Self-pay | Admitting: Oncology

## 2016-07-07 DIAGNOSIS — D45 Polycythemia vera: Secondary | ICD-10-CM

## 2016-07-10 ENCOUNTER — Telehealth: Payer: Self-pay | Admitting: Oncology

## 2016-07-10 NOTE — Telephone Encounter (Signed)
sw pt to confirm 6/29 appt at 1400 per sch msg

## 2016-07-25 ENCOUNTER — Other Ambulatory Visit (HOSPITAL_BASED_OUTPATIENT_CLINIC_OR_DEPARTMENT_OTHER): Payer: No Typology Code available for payment source

## 2016-07-25 ENCOUNTER — Ambulatory Visit (HOSPITAL_BASED_OUTPATIENT_CLINIC_OR_DEPARTMENT_OTHER): Payer: No Typology Code available for payment source

## 2016-07-25 VITALS — BP 142/88 | HR 90 | Temp 98.0°F | Resp 16

## 2016-07-25 DIAGNOSIS — D45 Polycythemia vera: Secondary | ICD-10-CM

## 2016-07-25 LAB — CBC WITH DIFFERENTIAL/PLATELET
BASO%: 1.1 % (ref 0.0–2.0)
BASOS ABS: 0.1 10*3/uL (ref 0.0–0.1)
EOS ABS: 0.5 10*3/uL (ref 0.0–0.5)
EOS%: 9.6 % — ABNORMAL HIGH (ref 0.0–7.0)
HCT: 48.2 % (ref 38.4–49.9)
HEMOGLOBIN: 15.5 g/dL (ref 13.0–17.1)
LYMPH%: 41.3 % (ref 14.0–49.0)
MCH: 28.4 pg (ref 27.2–33.4)
MCHC: 32.3 g/dL (ref 32.0–36.0)
MCV: 88 fL (ref 79.3–98.0)
MONO#: 0.4 10*3/uL (ref 0.1–0.9)
MONO%: 7.5 % (ref 0.0–14.0)
NEUT%: 40.5 % (ref 39.0–75.0)
NEUTROS ABS: 2 10*3/uL (ref 1.5–6.5)
PLATELETS: 166 10*3/uL (ref 140–400)
RBC: 5.48 10*6/uL (ref 4.20–5.82)
RDW: 15.7 % — AB (ref 11.0–14.6)
WBC: 4.9 10*3/uL (ref 4.0–10.3)
lymph#: 2 10*3/uL (ref 0.9–3.3)

## 2016-07-25 NOTE — Patient Instructions (Signed)

## 2016-08-22 ENCOUNTER — Other Ambulatory Visit: Payer: No Typology Code available for payment source

## 2016-09-01 ENCOUNTER — Telehealth: Payer: Self-pay | Admitting: *Deleted

## 2016-09-01 NOTE — Telephone Encounter (Signed)
FYI "I called needing to come in for my procedure and have not heard back yet."  Provided appointments 09-17-2016 beginning at 1:00 pm.  "I cannot come in the afternoons.  Need morning appointments because I use [facility] transportation."    Call transferred to schedule ext [phone].  Care coordination note, specialty comment added.added.

## 2016-09-03 ENCOUNTER — Telehealth: Payer: Self-pay | Admitting: Oncology

## 2016-09-03 NOTE — Telephone Encounter (Signed)
sw pt to confirm appt time change 8/22 at 1030 per voicemail

## 2016-09-04 ENCOUNTER — Telehealth: Payer: Self-pay | Admitting: Oncology

## 2016-09-04 NOTE — Telephone Encounter (Signed)
Sw pts wife regarding upcoming appts.

## 2016-09-17 ENCOUNTER — Ambulatory Visit: Payer: No Typology Code available for payment source | Admitting: Oncology

## 2016-09-17 ENCOUNTER — Other Ambulatory Visit: Payer: No Typology Code available for payment source

## 2016-09-18 ENCOUNTER — Telehealth: Payer: Self-pay | Admitting: Oncology

## 2016-09-18 ENCOUNTER — Telehealth: Payer: Self-pay

## 2016-09-18 NOTE — Telephone Encounter (Signed)
Called and left message with a family member concerning his upcoming appointment

## 2016-09-18 NOTE — Telephone Encounter (Signed)
Patient called in regards to rescheduling appointments. Patient not able to come as reschedule for later in the day 8/28. Moved appointments time for 8/28 appointment to 12:30 pm patient is aware this is the closet we can get to desired time of 12 pm.

## 2016-09-22 ENCOUNTER — Ambulatory Visit: Payer: No Typology Code available for payment source | Admitting: Oncology

## 2016-09-22 ENCOUNTER — Other Ambulatory Visit: Payer: No Typology Code available for payment source

## 2016-09-23 ENCOUNTER — Other Ambulatory Visit (HOSPITAL_BASED_OUTPATIENT_CLINIC_OR_DEPARTMENT_OTHER): Payer: No Typology Code available for payment source

## 2016-09-23 ENCOUNTER — Encounter: Payer: Self-pay | Admitting: Oncology

## 2016-09-23 ENCOUNTER — Ambulatory Visit: Payer: No Typology Code available for payment source | Admitting: Hematology and Oncology

## 2016-09-23 ENCOUNTER — Telehealth: Payer: Self-pay | Admitting: Oncology

## 2016-09-23 ENCOUNTER — Ambulatory Visit (HOSPITAL_BASED_OUTPATIENT_CLINIC_OR_DEPARTMENT_OTHER): Payer: No Typology Code available for payment source

## 2016-09-23 ENCOUNTER — Ambulatory Visit (HOSPITAL_BASED_OUTPATIENT_CLINIC_OR_DEPARTMENT_OTHER): Payer: No Typology Code available for payment source | Admitting: Oncology

## 2016-09-23 ENCOUNTER — Other Ambulatory Visit: Payer: No Typology Code available for payment source

## 2016-09-23 VITALS — BP 144/78 | HR 88 | Temp 98.1°F | Resp 18 | Wt 179.0 lb

## 2016-09-23 VITALS — BP 136/69 | HR 88 | Temp 98.0°F | Resp 18

## 2016-09-23 DIAGNOSIS — D751 Secondary polycythemia: Secondary | ICD-10-CM

## 2016-09-23 DIAGNOSIS — D45 Polycythemia vera: Secondary | ICD-10-CM

## 2016-09-23 DIAGNOSIS — Z720 Tobacco use: Secondary | ICD-10-CM

## 2016-09-23 LAB — CBC WITH DIFFERENTIAL/PLATELET
BASO%: 0.7 % (ref 0.0–2.0)
BASOS ABS: 0 10*3/uL (ref 0.0–0.1)
EOS%: 8.4 % — AB (ref 0.0–7.0)
Eosinophils Absolute: 0.4 10*3/uL (ref 0.0–0.5)
HEMATOCRIT: 45.4 % (ref 38.4–49.9)
HGB: 15 g/dL (ref 13.0–17.1)
LYMPH%: 44.3 % (ref 14.0–49.0)
MCH: 29.1 pg (ref 27.2–33.4)
MCHC: 33 g/dL (ref 32.0–36.0)
MCV: 88.2 fL (ref 79.3–98.0)
MONO#: 0.4 10*3/uL (ref 0.1–0.9)
MONO%: 9.6 % (ref 0.0–14.0)
NEUT#: 1.5 10*3/uL (ref 1.5–6.5)
NEUT%: 37 % — AB (ref 39.0–75.0)
PLATELETS: 131 10*3/uL — AB (ref 140–400)
RBC: 5.15 10*6/uL (ref 4.20–5.82)
RDW: 13.8 % (ref 11.0–14.6)
WBC: 4.2 10*3/uL (ref 4.0–10.3)
lymph#: 1.8 10*3/uL (ref 0.9–3.3)

## 2016-09-23 NOTE — Telephone Encounter (Signed)
Scheduled appt per 8/28 los - Gave patient AVS and calender per los.

## 2016-09-23 NOTE — Patient Instructions (Signed)

## 2016-09-23 NOTE — Assessment & Plan Note (Signed)
51 year old gentleman with the following issues:  1. Polycythemia: He had a long-standing history dating back to at least 2007 and have had intermittent phlebotomies at that time. The etiology of his polycythemia Is related to secondary causes unlikely to have a smoking history. His erythropoietin level and JAK 2 mutation do not support the diagnosis of polycythemia vera.  He continues to tolerate phlebotomy well and functions better with improved hemoglobin. The plan is to continue with phlebotomy on a monthly basis to keep his hematocrit less than 45. He will receive phlebotomy today and will have follow-up visit in 4 months.  2. Thrombosis prophylaxis: He is currently on aspirin which she will continue indefinitely.  3. Follow-up: Will be monthly to check his CBC and a potential phlebotomy. He will have a clinical visit in 4 months.

## 2016-09-23 NOTE — Progress Notes (Signed)
Eric Morton presents today for phlebotomy per MD orders. Phlebotomy procedure started at 1407 and ended at 1424. 520 grams removed via 20 gauge PIV to Right Forearm. Patient observed for 30 minutes after procedure without any incident. Patient tolerated procedure well. IV needle removed intact.

## 2016-09-23 NOTE — Progress Notes (Signed)
[facility] Follow up:    Eric Morale, MD [address] [place] [address]   DIAGNOSIS: this is a 51 year old gentleman with polycythemia dating back 2007 due to secondary causes. His polycythemia is related to smoking and has a negative JAK-2 mutation  SUMMARY OF ONCOLOGIC HISTORY:  No history exists.    CURRENT THERAPY: phlebotomy on a monthly basis to keep his hematocrit less than 45.  INTERVAL HISTORY: Eric Morton 51 y.o. male returns for routine follow-up by himself. The patient has missed follow-up appointments with [place] as well as some of his monthly phlebotomies. Last phlebotomy was performed on 07/25/2016. Last office visit was in October 2017. The patient stated he had financial difficulty and could not come to his appointments. Since his last visit, he has been diagnosed with glaucoma and is due to follow-up with a specialist for this. He has been tolerating phlebotomy intermittently well without any major complications. He denies any dizziness, falls, syncope. He denies any recent hospitalization or illnesses. Denies headaches. He denies any thrombotic or bleeding episodes. He continues to smoke periodically but has cut down to approximately 3-4 cigarettes per day. He denies fevers, chills, weight loss, appetite changes. The patient is here for evaluation of his lab work and for possible phlebotomy.   Patient Active Problem List   Diagnosis Date Noted  . Hyperlipidemia 07/27/2015  . Cerebrovascular accident (CVA) due to occlusion of cerebral artery ([place])   . Stroke ([place]) 03/21/2015  . Polycythemia vera ([place]) 03/21/2015  . Hx of adenomatous colonic polyps   . Dehydration   . Diarrhea   . Protein-calorie malnutrition ([place])   . Cellulitis of breast of male 09/25/2011  . HTN (hypertension) 09/25/2011  . DM (diabetes mellitus) ([place]) 09/25/2011  . Leukopenia 09/25/2011  . ETOH abuse 09/25/2011  . Cocaine abuse 09/25/2011  . Tobacco abuse  09/25/2011  . Neuropathy 09/25/2011    has No Known Allergies.  MEDICAL HISTORY: Past Medical History:  Diagnosis Date  . 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 Anxiety   . Chronic kidney disease    "jsut a little damage"  . Glaucoma   . History of stomach ulcers    not confirmed, never underwent diagnostic endo or radiology, this manifested as epigastric distress.   [name] Hx of adenomatous colonic polyps   . Hypercholesterolemia   . Hypertension   . Peripheral neuropathy   . Polycythemia vera ([place]) 2006  . Substance abuse   . Type II diabetes mellitus ([place])     SURGICAL HISTORY: Past Surgical History:  Procedure Laterality Date  . COLONOSCOPY WITH PROPOFOL N/A 04/28/2014   Procedure: COLONOSCOPY WITH PROPOFOL;  Surgeon: Gatha Mayer, MD;  Location: [facility];  Service: Endoscopy;  Laterality: N/A;  . IRRIGATION AND DEBRIDEMENT ABSCESS  09/26/2011   Procedure: IRRIGATION AND DEBRIDEMENT ABSCESS;  Surgeon: Imogene Burn. Georgette Dover, MD;  Location: [facility];  Service: General;  Laterality: Left;  Incision and Drainage left breast abscess    SOCIAL HISTORY: Social History   Social History  . Marital status: Widowed    Spouse name: N/A  . Number of children: N/A  . Years of education: N/A   Occupational History  . Not on file.   Social History Main Topics  . Smoking status: Current Some Day Smoker    Packs/day: 0.25    Years: 32.00    Types: Cigarettes  . Smokeless tobacco: Never Used  . Alcohol  use Yes     Comment: 3/14 quit drinking 5 days ago and then drank last nite  . Drug use: Yes    Types: Marijuana, "Crack" cocaine     Comment: snorts and smokes cocaine ocassionally   . Sexual activity: Yes   Other Topics Concern  . Not on file   Social History Narrative  . No narrative on file    FAMILY HISTORY: Family History  Problem Relation Age of Onset  . Diabetes Mother   . Hypertension Mother     Review of Systems  Constitutional: Negative.    HENT:  Negative.   Eyes: Negative.   Respiratory: Negative.   Cardiovascular: Negative.   Gastrointestinal: Negative.   Endocrine: Negative.   Genitourinary: Negative.    Skin: Negative.   Neurological: Negative.   Hematological: Negative.   Psychiatric/Behavioral: Negative.       PHYSICAL EXAMINATION  ECOG PERFORMANCE STATUS: 1 - Symptomatic but completely ambulatory  Vitals:   09/23/16 1303  BP: (!) 144/78  Pulse: 88  Resp: 18  Temp: 98.1 F (36.7 C)  SpO2: 100%    Physical Exam  Constitutional: He is oriented to person, place, and time and well-developed, well-nourished, and in no distress. No distress.  HENT:  Head: Normocephalic.  Mouth/Throat: Oropharynx is clear and moist. No oropharyngeal exudate.  Eyes: Conjunctivae are normal. Right eye exhibits no discharge. Left eye exhibits no discharge. No scleral icterus.  Neck: Normal range of motion. Neck supple.  Cardiovascular: Normal rate, regular rhythm, normal heart sounds and intact distal pulses.   Pulmonary/Chest: Effort normal and breath sounds normal. No respiratory distress. He has no wheezes. He has no rales.  Abdominal: Soft. He exhibits no distension and no mass. There is no tenderness.  Musculoskeletal: Normal range of motion. He exhibits no edema.  Lymphadenopathy:    He has no cervical adenopathy.  Neurological: He is alert and oriented to person, place, and time. Gait normal.  Skin: Skin is warm and dry. No rash noted. He is not diaphoretic. No erythema. No pallor.  Psychiatric: Mood, memory, affect and judgment normal.  Vitals reviewed.   LABORATORY DATA:  CBC    Component Value Date/Time   WBC 4.2 09/23/2016 1251   WBC 4.3 03/20/2015 1536   RBC 5.15 09/23/2016 1251   RBC 6.65 (H) 03/20/2015 1536   HGB 15.0 09/23/2016 1251   HCT 45.4 09/23/2016 1251   PLT 131 (L) 09/23/2016 1251   MCV 88.2 09/23/2016 1251   MCH 29.1 09/23/2016 1251   MCH 31.0 03/20/2015 1536   MCHC 33.0 09/23/2016  1251   MCHC 33.8 03/20/2015 1536   RDW 13.8 09/23/2016 1251   LYMPHSABS 1.8 09/23/2016 1251   MONOABS 0.4 09/23/2016 1251   EOSABS 0.4 09/23/2016 1251   BASOSABS 0.0 09/23/2016 1251    CMP     Component Value Date/Time   NA 136 06/06/2016 0944   K 4.5 06/06/2016 0944   CL 96 06/06/2016 0944   CO2 26 06/06/2016 0944   GLUCOSE 248 (H) 06/06/2016 0944   GLUCOSE 188 (H) 03/20/2015 1536   BUN 8 06/06/2016 0944   CREATININE 0.93 06/06/2016 0944   CREATININE 0.86 05/04/2014 1208   CALCIUM 9.3 06/06/2016 0944   PROT 6.8 06/06/2016 0944   ALBUMIN 3.8 06/06/2016 0944   AST 14 06/06/2016 0944   ALT 13 06/06/2016 0944   ALKPHOS 107 06/06/2016 0944   BILITOT 0.4 06/06/2016 0944   GFRNONAA 93 06/06/2016 0944   GFRAA 108  06/06/2016 0944    RADIOGRAPHIC STUDIES:  No results found.  ASSESSMENT and THERAPY PLAN:   Polycythemia vera ([place]) 51 year old gentleman with the following issues:  1. Polycythemia: He had a long-standing history dating back to at least 2007 and have had intermittent phlebotomies at that time. The etiology of his polycythemia Is related to secondary causes unlikely to have a smoking history. His erythropoietin level and JAK 2 mutation do not support the diagnosis of polycythemia vera.  He continues to tolerate phlebotomy well and functions better with improved hemoglobin. The plan is to continue with phlebotomy on a monthly basis to keep his hematocrit less than 45. He will receive phlebotomy today and will have follow-up visit in 4 months.  2. Thrombosis prophylaxis: He is currently on aspirin which she will continue indefinitely.  3. Follow-up: Will be monthly to check his CBC and a potential phlebotomy. He will have a clinical visit in 4 months.   Orders Placed This Encounter  Procedures  . CBC with Differential/Platelet    Standing Status:   Standing    Number of Occurrences:   10    Standing Expiration Date:   09/23/2017    All questions were  answered. The patient knows to call the clinic with any problems, questions or concerns. We can certainly see the patient much sooner if necessary.  Mikey Bussing, NP 09/23/2016

## 2016-10-21 ENCOUNTER — Other Ambulatory Visit: Payer: No Typology Code available for payment source

## 2016-10-22 ENCOUNTER — Telehealth: Payer: Self-pay

## 2016-10-22 ENCOUNTER — Telehealth: Payer: Self-pay | Admitting: Oncology

## 2016-10-22 NOTE — Telephone Encounter (Signed)
Spoke with patient regarding his appointments and moved them up for his transportation.

## 2016-10-22 NOTE — Telephone Encounter (Signed)
Left message with mother concerning appointment. Rescheduled appt. For Friday. Per 9/26 messages

## 2016-10-23 ENCOUNTER — Inpatient Hospital Stay (HOSPITAL_COMMUNITY)
Admission: EM | Admit: 2016-10-23 | Discharge: 2016-10-27 | DRG: 065 | Disposition: A | Discharge status: Rehabilitation Facility | Payer: Medicaid Other | Attending: Family Medicine | Admitting: Family Medicine

## 2016-10-23 ENCOUNTER — Emergency Department (HOSPITAL_COMMUNITY): Payer: Medicaid Other

## 2016-10-23 ENCOUNTER — Encounter (HOSPITAL_COMMUNITY): Payer: Self-pay | Admitting: *Deleted

## 2016-10-23 ENCOUNTER — Encounter: Payer: Self-pay | Admitting: *Deleted

## 2016-10-23 ENCOUNTER — Telehealth: Payer: Self-pay | Admitting: *Deleted

## 2016-10-23 ENCOUNTER — Inpatient Hospital Stay (HOSPITAL_COMMUNITY): Payer: Medicaid Other

## 2016-10-23 DIAGNOSIS — E1142 Type 2 diabetes mellitus with diabetic polyneuropathy: Secondary | ICD-10-CM | POA: Diagnosis present

## 2016-10-23 DIAGNOSIS — R402252 Coma scale, best verbal response, oriented, at arrival to emergency department: Secondary | ICD-10-CM | POA: Diagnosis present

## 2016-10-23 DIAGNOSIS — I161 Hypertensive emergency: Secondary | ICD-10-CM

## 2016-10-23 DIAGNOSIS — Z720 Tobacco use: Secondary | ICD-10-CM

## 2016-10-23 DIAGNOSIS — I69951 Hemiplegia and hemiparesis following unspecified cerebrovascular disease affecting right dominant side: Secondary | ICD-10-CM

## 2016-10-23 DIAGNOSIS — F17210 Nicotine dependence, cigarettes, uncomplicated: Secondary | ICD-10-CM | POA: Diagnosis present

## 2016-10-23 DIAGNOSIS — Z9114 Patient's other noncompliance with medication regimen: Secondary | ICD-10-CM | POA: Diagnosis not present

## 2016-10-23 DIAGNOSIS — E1122 Type 2 diabetes mellitus with diabetic chronic kidney disease: Secondary | ICD-10-CM | POA: Diagnosis present

## 2016-10-23 DIAGNOSIS — Z833 Family history of diabetes mellitus: Secondary | ICD-10-CM

## 2016-10-23 DIAGNOSIS — I10 Essential (primary) hypertension: Secondary | ICD-10-CM | POA: Diagnosis present

## 2016-10-23 DIAGNOSIS — E1151 Type 2 diabetes mellitus with diabetic peripheral angiopathy without gangrene: Secondary | ICD-10-CM | POA: Diagnosis present

## 2016-10-23 DIAGNOSIS — R531 Weakness: Secondary | ICD-10-CM

## 2016-10-23 DIAGNOSIS — E785 Hyperlipidemia, unspecified: Secondary | ICD-10-CM | POA: Diagnosis present

## 2016-10-23 DIAGNOSIS — R4701 Aphasia: Secondary | ICD-10-CM | POA: Diagnosis present

## 2016-10-23 DIAGNOSIS — I639 Cerebral infarction, unspecified: Principal | ICD-10-CM | POA: Diagnosis present

## 2016-10-23 DIAGNOSIS — F1910 Other psychoactive substance abuse, uncomplicated: Secondary | ICD-10-CM

## 2016-10-23 DIAGNOSIS — I69351 Hemiplegia and hemiparesis following cerebral infarction affecting right dominant side: Secondary | ICD-10-CM | POA: Diagnosis not present

## 2016-10-23 DIAGNOSIS — I6330 Cerebral infarction due to thrombosis of unspecified cerebral artery: Secondary | ICD-10-CM

## 2016-10-23 DIAGNOSIS — Z86010 Personal history of colonic polyps: Secondary | ICD-10-CM

## 2016-10-23 DIAGNOSIS — I63312 Cerebral infarction due to thrombosis of left middle cerebral artery: Secondary | ICD-10-CM

## 2016-10-23 DIAGNOSIS — Z7982 Long term (current) use of aspirin: Secondary | ICD-10-CM | POA: Diagnosis not present

## 2016-10-23 DIAGNOSIS — Z8249 Family history of ischemic heart disease and other diseases of the circulatory system: Secondary | ICD-10-CM | POA: Diagnosis not present

## 2016-10-23 DIAGNOSIS — F1010 Alcohol abuse, uncomplicated: Secondary | ICD-10-CM | POA: Diagnosis present

## 2016-10-23 DIAGNOSIS — H409 Unspecified glaucoma: Secondary | ICD-10-CM | POA: Diagnosis present

## 2016-10-23 DIAGNOSIS — R402142 Coma scale, eyes open, spontaneous, at arrival to emergency department: Secondary | ICD-10-CM | POA: Diagnosis present

## 2016-10-23 DIAGNOSIS — D45 Polycythemia vera: Secondary | ICD-10-CM | POA: Diagnosis present

## 2016-10-23 DIAGNOSIS — E1159 Type 2 diabetes mellitus with other circulatory complications: Secondary | ICD-10-CM

## 2016-10-23 DIAGNOSIS — Z7984 Long term (current) use of oral hypoglycemic drugs: Secondary | ICD-10-CM | POA: Diagnosis not present

## 2016-10-23 DIAGNOSIS — I69928 Other speech and language deficits following unspecified cerebrovascular disease: Secondary | ICD-10-CM

## 2016-10-23 DIAGNOSIS — F1410 Cocaine abuse, uncomplicated: Secondary | ICD-10-CM | POA: Diagnosis present

## 2016-10-23 DIAGNOSIS — R402362 Coma scale, best motor response, obeys commands, at arrival to emergency department: Secondary | ICD-10-CM | POA: Diagnosis present

## 2016-10-23 DIAGNOSIS — R29706 NIHSS score 6: Secondary | ICD-10-CM | POA: Diagnosis present

## 2016-10-23 DIAGNOSIS — Z9119 Patient's noncompliance with other medical treatment and regimen: Secondary | ICD-10-CM

## 2016-10-23 DIAGNOSIS — Z794 Long term (current) use of insulin: Secondary | ICD-10-CM

## 2016-10-23 DIAGNOSIS — R29810 Facial weakness: Secondary | ICD-10-CM | POA: Diagnosis present

## 2016-10-23 LAB — COMPREHENSIVE METABOLIC PANEL
ALT: 17 U/L (ref 17–63)
ANION GAP: 10 (ref 5–15)
AST: 24 U/L (ref 15–41)
Albumin: 3.2 g/dL — ABNORMAL LOW (ref 3.5–5.0)
Alkaline Phosphatase: 75 U/L (ref 38–126)
BILIRUBIN TOTAL: 0.8 mg/dL (ref 0.3–1.2)
BUN: 5 mg/dL — ABNORMAL LOW (ref 6–20)
CO2: 24 mmol/L (ref 22–32)
Calcium: 9.2 mg/dL (ref 8.9–10.3)
Chloride: 102 mmol/L (ref 101–111)
Creatinine, Ser: 1.02 mg/dL (ref 0.61–1.24)
Glucose, Bld: 189 mg/dL — ABNORMAL HIGH (ref 65–99)
POTASSIUM: 4 mmol/L (ref 3.5–5.1)
SODIUM: 136 mmol/L (ref 135–145)
TOTAL PROTEIN: 7.3 g/dL (ref 6.5–8.1)

## 2016-10-23 LAB — PROTIME-INR
INR: 0.94
PROTHROMBIN TIME: 12.5 s (ref 11.4–15.2)

## 2016-10-23 LAB — CBC WITH DIFFERENTIAL/PLATELET
BASOS ABS: 0 10*3/uL (ref 0.0–0.1)
Basophils Relative: 1 %
Eosinophils Absolute: 0.4 10*3/uL (ref 0.0–0.7)
Eosinophils Relative: 7 %
HEMATOCRIT: 46.1 % (ref 39.0–52.0)
Hemoglobin: 14.9 g/dL (ref 13.0–17.0)
LYMPHS PCT: 49 %
Lymphs Abs: 2.6 10*3/uL (ref 0.7–4.0)
MCH: 27.9 pg (ref 26.0–34.0)
MCHC: 32.3 g/dL (ref 30.0–36.0)
MCV: 86.2 fL (ref 78.0–100.0)
Monocytes Absolute: 0.5 10*3/uL (ref 0.1–1.0)
Monocytes Relative: 9 %
NEUTROS ABS: 1.9 10*3/uL (ref 1.7–7.7)
Neutrophils Relative %: 34 %
PLATELETS: 147 10*3/uL — AB (ref 150–400)
RBC: 5.35 MIL/uL (ref 4.22–5.81)
RDW: 13.3 % (ref 11.5–15.5)
WBC: 5.4 10*3/uL (ref 4.0–10.5)

## 2016-10-23 LAB — URINALYSIS, ROUTINE W REFLEX MICROSCOPIC
BACTERIA UA: NONE SEEN
Bilirubin Urine: NEGATIVE
Glucose, UA: 50 mg/dL — AB
KETONES UR: NEGATIVE mg/dL
Leukocytes, UA: NEGATIVE
NITRITE: NEGATIVE
Protein, ur: >=300 mg/dL — AB
Specific Gravity, Urine: 1.009 (ref 1.005–1.030)
pH: 7 (ref 5.0–8.0)

## 2016-10-23 LAB — I-STAT CHEM 8, ED
BUN: 7 mg/dL (ref 6–20)
CALCIUM ION: 1.09 mmol/L — AB (ref 1.15–1.40)
CHLORIDE: 103 mmol/L (ref 101–111)
Creatinine, Ser: 0.9 mg/dL (ref 0.61–1.24)
GLUCOSE: 196 mg/dL — AB (ref 65–99)
HCT: 48 % (ref 39.0–52.0)
HEMOGLOBIN: 16.3 g/dL (ref 13.0–17.0)
Potassium: 4.6 mmol/L (ref 3.5–5.1)
SODIUM: 138 mmol/L (ref 135–145)
TCO2: 27 mmol/L (ref 22–32)

## 2016-10-23 LAB — RAPID URINE DRUG SCREEN, HOSP PERFORMED
AMPHETAMINES: NOT DETECTED
BENZODIAZEPINES: NOT DETECTED
Barbiturates: NOT DETECTED
Cocaine: POSITIVE — AB
OPIATES: NOT DETECTED
Tetrahydrocannabinol: NOT DETECTED

## 2016-10-23 LAB — HEMOGLOBIN A1C
HEMOGLOBIN A1C: 8.9 % — AB (ref 4.8–5.6)
MEAN PLASMA GLUCOSE: 208.73 mg/dL

## 2016-10-23 LAB — LIPID PANEL
Cholesterol: 195 mg/dL (ref 0–200)
HDL: 64 mg/dL (ref >40)
LDL CALC: 99 mg/dL (ref 0–99)
TRIGLYCERIDES: 161 mg/dL — AB (ref <150)
Total CHOL/HDL Ratio: 3 RATIO
VLDL: 32 mg/dL (ref 0–40)

## 2016-10-23 LAB — GLUCOSE, CAPILLARY: GLUCOSE-CAPILLARY: 157 mg/dL — AB (ref 65–99)

## 2016-10-23 LAB — I-STAT TROPONIN, ED: Troponin i, poc: 0.01 ng/mL (ref 0.00–0.08)

## 2016-10-23 LAB — ETHANOL: Alcohol, Ethyl (B): <10 mg/dL (ref <10)

## 2016-10-23 LAB — APTT: APTT: 26 s (ref 24–36)

## 2016-10-23 MED ORDER — THIAMINE HCL 100 MG/ML IJ SOLN: 100.0000 mg | Freq: Every day | INTRAMUSCULAR | Status: DC

## 2016-10-23 MED ORDER — SODIUM CHLORIDE 0.9 % IV SOLN: INTRAVENOUS | Status: DC

## 2016-10-23 MED ORDER — FOLIC ACID 1 MG PO TABS
1.0000 mg | ORAL_TABLET | Freq: Every day | ORAL | Status: DC
  Administered 2016-10-25 – 2016-10-27 (×3): 1 mg via ORAL
  Filled 2016-10-23 (×4): qty 1

## 2016-10-23 MED ORDER — HYDRALAZINE HCL 20 MG/ML IJ SOLN: 5.0000 mg | INTRAMUSCULAR | Status: DC | PRN

## 2016-10-23 MED ORDER — INSULIN ASPART 100 UNIT/ML ~~LOC~~ SOLN
0.0000 [IU] | SUBCUTANEOUS | Status: DC
  Administered 2016-10-23: 2 [IU] via SUBCUTANEOUS
  Administered 2016-10-24 (×3): 1 [IU] via SUBCUTANEOUS
  Administered 2016-10-24: 2 [IU] via SUBCUTANEOUS
  Administered 2016-10-24: 1 [IU] via SUBCUTANEOUS
  Administered 2016-10-24 – 2016-10-25 (×3): 2 [IU] via SUBCUTANEOUS
  Administered 2016-10-25 (×2): 1 [IU] via SUBCUTANEOUS
  Administered 2016-10-26: 2 [IU] via SUBCUTANEOUS
  Administered 2016-10-26 – 2016-10-27 (×3): 1 [IU] via SUBCUTANEOUS

## 2016-10-23 MED ORDER — CLOPIDOGREL BISULFATE 75 MG PO TABS
75.0000 mg | ORAL_TABLET | Freq: Every day | ORAL | Status: DC
  Administered 2016-10-24 – 2016-10-27 (×4): 75 mg via ORAL
  Filled 2016-10-23 (×4): qty 1

## 2016-10-23 MED ORDER — LORAZEPAM 2 MG/ML IJ SOLN
INTRAMUSCULAR | Status: AC
  Administered 2016-10-23: 2 mg via INTRAVENOUS
  Filled 2016-10-23: qty 1

## 2016-10-23 MED ORDER — ACETAMINOPHEN 160 MG/5ML PO SOLN: 650.0000 mg | ORAL | Status: DC | PRN

## 2016-10-23 MED ORDER — IOPAMIDOL (ISOVUE-370) INJECTION 76%
INTRAVENOUS | Status: AC
  Filled 2016-10-23: qty 50

## 2016-10-23 MED ORDER — LORAZEPAM 2 MG/ML IJ SOLN
1.0000 mg | Freq: Four times a day (QID) | INTRAMUSCULAR | Status: AC | PRN
  Administered 2016-10-24 (×2): 1 mg via INTRAVENOUS
  Filled 2016-10-23 (×2): qty 1

## 2016-10-23 MED ORDER — LORAZEPAM 2 MG/ML IJ SOLN
2.0000 mg | Freq: Once | INTRAMUSCULAR | Status: AC
  Administered 2016-10-23: 2 mg via INTRAVENOUS

## 2016-10-23 MED ORDER — LABETALOL HCL 5 MG/ML IV SOLN
20.0000 mg | Freq: Once | INTRAVENOUS | Status: AC
  Administered 2016-10-23: 20 mg via INTRAVENOUS

## 2016-10-23 MED ORDER — ACETAMINOPHEN 325 MG PO TABS
650.0000 mg | ORAL_TABLET | ORAL | Status: DC | PRN
  Administered 2016-10-25 – 2016-10-26 (×5): 650 mg via ORAL
  Filled 2016-10-23 (×6): qty 2

## 2016-10-23 MED ORDER — LABETALOL HCL 5 MG/ML IV SOLN
INTRAVENOUS | Status: AC
  Filled 2016-10-23: qty 4

## 2016-10-23 MED ORDER — ATORVASTATIN CALCIUM 40 MG PO TABS: 40.0000 mg | ORAL_TABLET | Freq: Every day | ORAL | Status: DC

## 2016-10-23 MED ORDER — ASPIRIN EC 325 MG PO TBEC
325.0000 mg | DELAYED_RELEASE_TABLET | Freq: Every day | ORAL | Status: DC
  Administered 2016-10-24 – 2016-10-27 (×4): 325 mg via ORAL
  Filled 2016-10-23 (×4): qty 1

## 2016-10-23 MED ORDER — ASPIRIN 300 MG RE SUPP
300.0000 mg | Freq: Once | RECTAL | Status: AC
  Administered 2016-10-23: 300 mg via RECTAL
  Filled 2016-10-23: qty 1

## 2016-10-23 MED ORDER — ADULT MULTIVITAMIN W/MINERALS CH
1.0000 | ORAL_TABLET | Freq: Every day | ORAL | Status: DC
  Administered 2016-10-25 – 2016-10-27 (×3): 1 via ORAL
  Filled 2016-10-23 (×4): qty 1

## 2016-10-23 MED ORDER — SODIUM CHLORIDE 0.9 % IV BOLUS (SEPSIS)
500.0000 mL | Freq: Once | INTRAVENOUS | Status: AC
  Administered 2016-10-23: 500 mL via INTRAVENOUS

## 2016-10-23 MED ORDER — ACETAMINOPHEN 650 MG RE SUPP: 650.0000 mg | RECTAL | Status: DC | PRN

## 2016-10-23 MED ORDER — LORAZEPAM 1 MG PO TABS: 1.0000 mg | ORAL_TABLET | Freq: Four times a day (QID) | ORAL | Status: AC | PRN

## 2016-10-23 MED ORDER — LABETALOL HCL 5 MG/ML IV SOLN: 5.0000 mg | INTRAVENOUS | Status: DC | PRN

## 2016-10-23 MED ORDER — STROKE: EARLY STAGES OF RECOVERY BOOK
Freq: Once | Status: DC
  Filled 2016-10-23: qty 1

## 2016-10-23 MED ORDER — IOPAMIDOL (ISOVUE-370) INJECTION 76%
INTRAVENOUS | Status: AC
  Administered 2016-10-23: 100 mL
  Filled 2016-10-23: qty 100

## 2016-10-23 MED ORDER — CLOPIDOGREL BISULFATE 300 MG PO TABS
300.0000 mg | ORAL_TABLET | Freq: Once | ORAL | Status: AC
  Administered 2016-10-23: 300 mg via ORAL
  Filled 2016-10-23: qty 1

## 2016-10-23 MED ORDER — VITAMIN B-1 100 MG PO TABS
100.0000 mg | ORAL_TABLET | Freq: Every day | ORAL | Status: DC
  Administered 2016-10-25 – 2016-10-27 (×3): 100 mg via ORAL
  Filled 2016-10-23 (×4): qty 1

## 2016-10-23 MED ORDER — SODIUM CHLORIDE 0.9 % IV SOLN: 100.0000 mL/h | INTRAVENOUS | Status: DC

## 2016-10-23 NOTE — Progress Notes (Signed)
Pt arrived on unit at 1745. NG tube was coiled in pt's mouth and therefore DC'd. Pt was soiled. Pt cleaned and bed changed. Pt transported to CT/MRI. Will connect pt to telemetry& replace NG tube upon return to 5W.

## 2016-10-23 NOTE — Telephone Encounter (Signed)
Patient's aunt shirley shoffner calling, to cancel appt 10/24/16. Patient had a seizure, ambulance called. He is an  inpatient at [facility] hospital now.

## 2016-10-23 NOTE — Plan of Care (Signed)
Pt back from CTA head and neck. Still moving around in CT but CTA head and neck no LVO. Will initiate ASA 300mg  stat. Will put NG tube and start plavix load 300mg . Non-violent restrain to keep NG tube. Continue fluid. Hydralazine PRN for BP < 220/120. Avoid beta blocker due to cocaine use.   Recommend to admit to internal medication for stroke work up and PT/OT eval. Stroke team will follow. Thanks.   Rosalin Hawking, MD PhD Stroke Neurology 10/23/2016 11:28 AM

## 2016-10-23 NOTE — ED Notes (Addendum)
Pt moved to hospital bed and transported upstairs.  Charge RN made aware of plan.

## 2016-10-23 NOTE — ED Provider Notes (Signed)
[facility] DEPT Provider Note   CSN: [id number] Arrival date & time: 10/23/16  0900   An emergency department physician performed an initial assessment on this suspected stroke patient at 51.  History   Chief Complaint Chief Complaint  Patient presents with  . Weakness    HPI Eric Morton is a 51 y.o. male.  HPI  Patient presents with new neurologic deficits. Patient is here with a companion. Apparently the patient went to sleep in his usual state of health approximately 11 hours prior to ED arrival. Patient has a prior stroke, has baseline slight slurred speech, slight weakness in the right side. Patient awoke with new hemiplegia throughout the right side, as well as near-complete aphasia. The patient makes hand gestures, grunts, cannot communicate. History provided by EMS providers, companion, level V caveat secondary to mental status change.   Past Medical History:  Diagnosis Date  . 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 Anxiety   . Chronic kidney disease    "jsut a little damage"  . Glaucoma   . History of stomach ulcers    not confirmed, never underwent diagnostic endo or radiology, this manifested as epigastric distress.   [name] Hx of adenomatous colonic polyps   . Hypercholesterolemia   . Hypertension   . Peripheral neuropathy   . Polycythemia vera ([place]) 2006  . Substance abuse   . Type II diabetes mellitus [facility])     Patient Active Problem List   Diagnosis Date Noted  . Hyperlipidemia 07/27/2015  . Cerebrovascular accident (CVA) due to occlusion of cerebral artery ([place])   . Stroke ([place]) 03/21/2015  . Polycythemia vera ([place]) 03/21/2015  . Hx of adenomatous colonic polyps   . Dehydration   . Diarrhea   . Protein-calorie malnutrition ([place])   . Cellulitis of breast of male 09/25/2011  . HTN (hypertension) 09/25/2011  . DM (diabetes mellitus) ([place]) 09/25/2011  . Leukopenia 09/25/2011  . ETOH abuse 09/25/2011  . Cocaine abuse  09/25/2011  . Tobacco abuse 09/25/2011  . Neuropathy 09/25/2011    Past Surgical History:  Procedure Laterality Date  . COLONOSCOPY WITH PROPOFOL N/A 04/28/2014   Procedure: COLONOSCOPY WITH PROPOFOL;  Surgeon: Gatha Mayer, MD;  Location: [facility];  Service: Endoscopy;  Laterality: N/A;  . IRRIGATION AND DEBRIDEMENT ABSCESS  09/26/2011   Procedure: IRRIGATION AND DEBRIDEMENT ABSCESS;  Surgeon: Imogene Burn. Georgette Dover, MD;  Location: [facility];  Service: General;  Laterality: Left;  Incision and Drainage left breast abscess       Home Medications    Prior to Admission medications   Medication Sig Start Date End Date Taking? Authorizing Provider  aspirin EC 81 MG tablet Take 1 tablet (81 mg total) by mouth daily. 06/06/16   Arnoldo Morale, MD  folic acid (FOLVITE) 1 MG tablet Take 1 tablet (1 mg total) by mouth daily. 03/23/15   Oswald Hillock, MD  gabapentin (NEURONTIN) 300 MG capsule Take 2 capsules (600 mg total) by mouth 2 (two) times daily. 06/06/16   Arnoldo Morale, MD  glipiZIDE (GLUCOTROL) 5 MG tablet Take 1 tablet (5 mg total) by mouth 2 (two) times daily before a meal. 06/06/16   Arnoldo Morale, MD  lisinopril (PRINIVIL,ZESTRIL) 20 MG tablet Take 1 tablet (20 mg total) by mouth daily. 06/06/16   Arnoldo Morale, MD  metFORMIN (GLUCOPHAGE) 1000 MG tablet Take 1 tablet (1,000 mg total) by mouth 2 (two) times daily with a meal. 06/06/16   Amao, [place],  MD  pantoprazole (PROTONIX) 40 MG tablet Take 1 tablet (40 mg total) by mouth daily at 6 (six) AM. 03/23/15   Oswald Hillock, MD  simvastatin (ZOCOR) 40 MG tablet Take 1 tablet (40 mg total) by mouth at bedtime. Patient not taking: Reported on 09/23/2016 06/06/16   Arnoldo Morale, MD  thiamine 100 MG tablet Take 1 tablet (100 mg total) by mouth daily. 03/23/15   Oswald Hillock, MD    Family History Family History  Problem Relation Age of Onset  . Diabetes Mother   . Hypertension Mother     Social History Social History  Substance Use Topics  .  Smoking status: Current Some Day Smoker    Packs/day: 0.25    Years: 32.00    Types: Cigarettes  . Smokeless tobacco: Never Used  . Alcohol use Yes     Comment: 3/14 quit drinking 5 days ago and then drank last nite     Allergies   Patient has no known allergies.   Review of Systems Review of Systems  Unable to perform ROS: Mental status change     Physical Exam Updated Vital Signs BP (!) 206/101   Pulse 95   Temp 98.6 F (37 C) (Oral)   Resp 12   SpO2 100%   Physical Exam  Constitutional: He appears well-developed. He appears distressed.  HENT:  Head: Normocephalic and atraumatic.  Eyes: Conjunctivae and EOM are normal.  Cardiovascular: Normal rate and regular rhythm.   Pulmonary/Chest: Effort normal. No stridor. No respiratory distress.  Abdominal: He exhibits no distension.  Musculoskeletal: He exhibits no edema.  Neurological: He is alert.  Patient moving his left side spontaneously, flailing, visibly frustrated at the inability to move his right side or to phonate.  Skin: Skin is warm and dry.  Psychiatric: His mood appears anxious.  Nursing note and vitals reviewed.    ED Treatments / Results  Labs (all labs ordered are listed, but only abnormal results are displayed) Labs Reviewed  COMPREHENSIVE METABOLIC PANEL - Abnormal; Notable for the following:       Result Value   Glucose, Bld 189 (*)    BUN 5 (*)    Albumin 3.2 (*)    All other components within normal limits  CBC WITH DIFFERENTIAL/PLATELET - Abnormal; Notable for the following:    Platelets 147 (*)    All other components within normal limits  RAPID URINE DRUG SCREEN, HOSP PERFORMED - Abnormal; Notable for the following:    Cocaine POSITIVE (*)    All other components within normal limits  URINALYSIS, ROUTINE W REFLEX MICROSCOPIC - Abnormal; Notable for the following:    Color, Urine STRAW (*)    Glucose, UA 50 (*)    Hgb urine dipstick SMALL (*)    Protein, ur >=300 (*)    Squamous  Epithelial / LPF 0-5 (*)    All other components within normal limits  LIPID PANEL - Abnormal; Notable for the following:    Triglycerides 161 (*)    All other components within normal limits  I-STAT CHEM 8, ED - Abnormal; Notable for the following:    Glucose, Bld 196 (*)    Calcium, Ion 1.09 (*)    All other components within normal limits  APTT  ETHANOL  PROTIME-INR  HEMOGLOBIN A1C  I-STAT TROPONIN, ED    EKG  EKG Interpretation  Date/Time:  Thursday October 23 2016 09:32:53 EDT Ventricular Rate:  96 PR Interval:    QRS Duration: 76  QT Interval:  356 QTC Calculation: 450 R Axis:   55 Text Interpretation:  Sinus rhythm Minimal ST depression, inferior leads Abnormal ekg Confirmed by Carmin Muskrat [phone]) on 10/23/2016 11:22:26 AM       Radiology Ct Cerebral Perfusion W Contrast  Result Date: 10/23/2016 CLINICAL DATA:  51 year old male code stroke. Altered mental status and flaccid right arm. Last seen normal last night. EXAM: CT PERFUSION BRAIN TECHNIQUE: Multiphase CT imaging of the brain was performed following IV bolus contrast injection. Subsequent parametric perfusion maps were calculated using RAPID software. CONTRAST:  90 mL Isovue 370 IV contrast extravasation was observed in the CT suite during the CTA. Extravasated contrast volume estimated at 50 mL. Radiologist Dr. Janeece Fitting was consulted and examined the patient who was then returned to the ED for continued care. COMPARISON:  Head CT without contrast 1008 hours today. Brain MRI and intracranial MRA 03/21/2015 FINDINGS: CT Brain Perfusion Findings: CBF (<30%) Volume:  0 mL Perfusion (Tmax>6.0s) volume: 4 mL, inferior left temporal gyrus region. Where as less stringent T-max values begin to implicate contralateral right corona radiata tissue. Mismatch Volume:  4 mL Infarction Location:Not applicable CTA CTA head and neck was also attempted at this time, but IV extravasation occurred, such that the attempted CTA images  are noncontrast. I discussed this by telephone with Dr. Erlinda Hong On 10/23/2016 at 1045 hours, and he advises a new IV placement and repeat CTA is planned at this time. IMPRESSION: 1. CT perfusion does NOT detect core infarct. And CTP suggests what are probably artifactual hypoperfusion areas in both hemispheres. 2. Initial CTA was noncontrast and nondiagnostic due to 50 mL IV extravasation (see contrast section above). 3. I discussed the above by telephone with Dr. Erlinda Hong On 10/23/2016 at 1045 hours, and he advises a new IV placement and repeat CTA is planned at this time. Electronically Signed   By: Genevie Ann M.D.   On: 10/23/2016 10:50   Ct Head Code Stroke Wo Contrast`  Result Date: 10/23/2016 CLINICAL DATA:  Code stroke. 51 year old male with altered mental status, right arm flaccid. Last known normal last night. EXAM: CT HEAD WITHOUT CONTRAST TECHNIQUE: Contiguous axial images were obtained from the base of the skull through the vertex without intravenous contrast. COMPARISON:  Brain MRI, intracranial MRA and head CT 03/21/2015 FINDINGS: Brain: Chronic deep gray matter, left cerebellar, and scattered white matter lacunar infarcts as demonstrated on the 2017 MRI. Expected Evolution of the 2017 right corona radiata infarct. Gray-white matter differentiation elsewhere appears stable. No cortically based acute infarct identified. No acute intracranial hemorrhage identified. No midline shift, mass effect, or evidence of intracranial mass lesion. No ventriculomegaly. Vascular: Calcified atherosclerosis at the skull base. No suspicious intracranial vascular hyperdensity. Skull: Chronic right lamina papyracea fracture. No acute osseous abnormality identified. Sinuses/Orbits: Chronic ethmoid opacification. Chronic right frontoethmoidal recess opacification. Stable paranasal sinuses. Mastoids and tympanic cavities remain clear. Other: Mild leftward gaze deviation. No acute orbit or scalp soft tissue findings. ASPECTS [facility]  Stroke Program Early CT Score) - Ganglionic level infarction (caudate, lentiform nuclei, internal capsule, insula, M1-M3 cortex): 7 - Supraganglionic infarction (M4-M6 cortex): 3 Total score (0-10 with 10 being normal): 10 IMPRESSION: 1. 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 No acute cortically based infarct or acute intracranial hemorrhage identified. 2. ASPECTS is 10. 3. The above was relayed via text pager to Dr. Erlinda Hong On 10/23/2016 at 10:16 . Electronically Signed   By: Genevie Ann M.D.   On: 10/23/2016  10:16    Procedures Procedures (including critical care time)  Medications Ordered in ED Medications  sodium chloride 0.9 % bolus 500 mL (not administered)    Followed by  0.9 %  sodium chloride infusion (not administered)  labetalol (NORMODYNE,TRANDATE) injection 5 mg (not administered)  hydrALAZINE (APRESOLINE) injection 5 mg (not administered)  iopamidol (ISOVUE-370) 76 % injection (not administered)  iopamidol (ISOVUE-370) 76 % injection (100 mLs  Contrast Given 10/23/16 1036)  LORazepam (ATIVAN) injection 2 mg (2 mg Intravenous Given 10/23/16 1010)  labetalol (NORMODYNE,TRANDATE) injection 20 mg (20 mg Intravenous Given 10/23/16 1044)     Initial Impression / Assessment and Plan / ED Course  I have reviewed the triage vital signs and the nursing notes.  Pertinent labs & imaging results that were available during my care of the patient were reviewed by me and considered in my medical decision making (see chart for details).  Update: Significant for my initial evaluation the patient had transient return of functionality in his right arm, not quite to his baseline, but with capacity to move the upper and lower extremity, as well as improved, brief speech. This was temporary, but given the return to near functional status, the patient was designated as a code stroke almost 1 hour after his initial evaluation. Expeditious CT scan was performed, case was  discussed with neurology.  12:25 PM All studies have resulted. No indication for emergent intervention, per neurology. However, given patient's waxing, waning symptoms, the patient will require both aspirin and Plavix. Patient had rectal aspirin, required placement of an NG tube, which was just accomplished without complication, to provide Plavix per Patient's symptoms are again profound. I discussed patient's case with our family practice team for admission for further evaluation and management.  Final Clinical Impressions(s) / ED Diagnoses  Aphasia Hemiplegia   Carmin Muskrat, MD 10/23/16 1226

## 2016-10-23 NOTE — Progress Notes (Addendum)
STROKE TEAM PROGRESS NOTE   HISTORY OF PRESENT ILLNESS (per record) Eric Morton is a 51 year-old male with a history of prior small stroke in 2017 with residual right-sided deficits, CKD, hypertension, hyperlipidemia, polysubstance abuse, polycythemia vera, and DM2 who presented to [facility] via [place] after waking up on 10/23/2016 with right-sided weakness and dysarthria.  At that time, the pt was unable to walk.  Per EMS, his symptoms worsened to right-sided hemiparesis en route.  Per ED physician, his right-sided weakness and dysarthria have waxed and waned since arrival.  CT perfusion study ordered.  Initial study inconclusive due to agitation/extravastion of IV.  Pt received 2 mg IV lorazepam for agitation.  Pt was having hypertensive emergency in the ED with BPs  In 220s/130s, treated with labetalol for goal of BP < 220/110.  Initial  NIHSS of 6.    Per a friend, the patient has likely used street drugs recently.  UDS positive for cocaine.  The pt has cognitive impairment at baseline, and his aunt helps him keep track of his medications.   Pt arrived with girlfriend, who left shortly after he went for initial imaging.    Patient was not administered IV t-PA secondary to arriving outside of the tPA treatment window.  He was admitted to General Neurology for further evaluation and treatment.   SUBJECTIVE (INTERVAL HISTORY) [name] is at the bedside.  The patient is awake and agitated, sitting in chair but agitated to go back to bed. Severe dysarthria and no able to get words out, doubt aphasia. Right severe hemiparesis.      OBJECTIVE Temp:  [98.6 F (37 C)] 98.6 F (37 C) (09/27 0910) Pulse Rate:  [95-106] 95 (09/27 1055) Resp:  [12-25] 12 (09/27 1055) BP: (176-212)/(101-141) 206/101 (09/27 1055) SpO2:  [97 %-100 %] 100 % (09/27 1055)  CBC:   Recent Labs Lab 10/23/16 0906 10/23/16 1005  WBC 5.4  --   NEUTROABS 1.9  --   HGB 14.9 16.3  HCT 46.1 48.0  MCV 86.2  --   PLT 147*  --      Basic Metabolic Panel:   Recent Labs Lab 10/23/16 0906 10/23/16 1005  NA 136 138  K 4.0 4.6  CL 102 103  CO2 24  --   GLUCOSE 189* 196*  BUN 5* 7  CREATININE 1.02 0.90  CALCIUM 9.2  --     Lipid Panel:     Component Value Date/Time   CHOL 236 (H) 06/06/2016 0944   TRIG 219 (H) 06/06/2016 0944   HDL 63 06/06/2016 0944   CHOLHDL 3.7 06/06/2016 0944   CHOLHDL 3.4 03/21/2015 0339   VLDL 35 03/21/2015 0339   LDLCALC 129 (H) 06/06/2016 0944   HgbA1c:  Lab Results  Component Value Date   HGBA1C 11.2 06/06/2016   Urine Drug Screen:     Component Value Date/Time   LABOPIA NONE DETECTED 10/23/2016 0918   COCAINSCRNUR POSITIVE (A) 10/23/2016 0918   COCAINSCRNUR POS (A) 11/10/2007 2043   LABBENZ NONE DETECTED 10/23/2016 0918   LABBENZ NEG 11/10/2007 2043   AMPHETMU NONE DETECTED 10/23/2016 0918   THCU NONE DETECTED 10/23/2016 0918   LABBARB NONE DETECTED 10/23/2016 0918    Alcohol Level     Component Value Date/Time   ETH <10 10/23/2016 0906    IMAGING I have personally reviewed the radiological images below and agree with the radiology interpretations.  Ct Head Code Stroke Wo Contrast 10/23/2016 IMPRESSION: 1. Age advanced chronic small vessel  disease appears stable since the 2017 MRI. No acute cortically based infarct or acute intracranial hemorrhage identified. 2. ASPECTS is 10. 3.   Ct Cerebral Perfusion W Contrast 10/23/2016 IMPRESSION: 1. CT perfusion does NOT detect core infarct. And CTP suggests what are probably artifactual hypoperfusion areas in both hemispheres. 2. Initial CTA was noncontrast and nondiagnostic due to 50 mL IV extravasation (see contrast section above). 3.10/23/2016  CTA head and neck 1. Negative for emergent large vessel occlusion. 2. 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 This appears stable since the 2017 Eric MRI, and there is retrograde reconstitution of the distal left V4 segment and  left PICA. 3. No significant carotid or right vertebral artery stenosis in the neck despite atherosclerosis. 4. 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 There are moderate or severe stenoses of the bilateral ICA siphons, the right vertebral artery V4 segment which supplies the basilar, the basilar artery, and the right MCA M1 and right PCA P1 segments. These findings have progressed since the 2017 MRA. 5. Attenuated flow in the right MCA and right PCA branches, but better maintained flow in the bilateral ACA, left MCA and left PCA branches.  Mr Eric Wo Contrast 10/23/2016 IMPRESSION: 1. Limited 3 sequence motion degraded MRI of the head. 2. Numerous acute supra- and infratentorial infarcts, largest measuring 18 x 7 mm LEFT basal ganglia. Distribution suggests embolic etiology. 3. Moderate chronic small vessel ischemic disease better characterized on prior MRI. 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   TTE 10/23/2016 pending  LE venous doppler - no DVT   PHYSICAL EXAM  Temp:  [97.5 F (36.4 C)-98.5 F (36.9 C)] 98.4 F (36.9 C) (09/28 1400) Pulse Rate:  [87-100] 87 (09/28 1400) Resp:  [11-19] 18 (09/28 1400) BP: (129-183)/(80-106) 181/87 (09/28 1400) SpO2:  [98 %-100 %] 100 % (09/28 1400) Weight:  [163 lb 4.8 oz (74.1 kg)] 163 lb 4.8 oz (74.1 kg) (09/27 1748)  General - Well nourished, well developed, agitated in chair, upset about right sided weakness.  Ophthalmologic - Fundi not visualized due to noncooperation.  Cardiovascular - Regular rate and rhythm.  Neuro - awake alert but severely dysarthria and hardly to make words out, however, he is able to follow all commands and able to name and repeat but intelligible words. Doubt any aphasia. PERRL, EOMI, blinking to visual threat bilaterally, right facial droop, drooling, severe dysarthria. Tongue in midline. LUE and LLE 5/5. RUE 3-/5 and RLE 3/5.  DTR 1+ and right babinski positive. Sensation and coordination not cooperative and gait not tested.    ASSESSMENT/PLAN Eric Morton is a 51 y.o. male with history of prior small stroke in 2017 with residual right-sided deficits, CKD, hypertension, hyperlipidemia, polysubstance abuse, polycythemia vera, and DM2 who presented to [facility] via [place] after waking up on 10/23/2016 with right-sided weakness and dysarthria.  He did not receive IV t-PA due to arriving outside of the tPA treatment window.      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 cardioembolic source  Resultant  Severe dysarthria, right facial droop, right severe hemiparesis  CT head: No acute stroke.  Advanced small vessel disease  MRI head multifocal bilateral infarcts largest at left BG  CTA head/neck: widespread severe intracranial vascular disease and atherosclerosis including b/l siphons, right M1 and P1, BA, left VA and  right V4  2D Echo  pending  LE venous doppler no DVT  Recommend 30 day cardiac event monitoring as outpt to rule out afib  UDS positive for cocaine  LDL 129  HgbA1c 11.2  SCDs for VTE prophylaxis Diet NPO time specified  aspirin 81 mg daily prior to admission, now on ASA 325mg  and Plavix. Continue DAPT for 3 months and the plavix alone.   Patient counseled to be compliant with his antithrombotic medications  Ongoing aggressive stroke risk factor management  Therapy recommendations:  Pending  Disposition: pending  Hx of stroke  right BG stroke in 02/2015 - MRA mild athro, CUS and TTE unremarkable. A1C 8.2 and LDL 120 and he refused statin that time. His UDS showed positive for cocaine and THC. He was discharged with ASA.   Cocaine abuse  UDS positive for cocaine  Cessation counseling provided  Pt is willing to quit  Diabetes  HgbA1c 11.2, goal <  7.0  Uncontrolled  Not compliant with medication  DM education   SSI  Hypertensive Emergency  Unstable  Permissive hypertension (OK if < 220/120) but gradually normalize in 5-7 days  Long-term BP goal normotensive  Hyperlipidemia  Home meds: simvastatin  LDL 129, goal < 70  Started lipitor 80mg   Continue statin at discharge  Severe dysarthria  Due to bilateral stroke  Drooling, no aphasia   Speech following  Dysphagia   Speech following  On dys 1 diet and honey thick liquids  Tobacco abuse  Current smoker  Smoking cessation counseling provided  Pt is willing to quit  Other Stroke Risk Factors   S/s of Obstructive sleep apnea - consider outpt sleep study  Other Active Problems  Medical noncompliance  Hospital day # 0  Neurology will sign off. Please call with questions. Pt will follow up with Cecille Rubin, NP, at [facility] in about 6 weeks. Thanks for the consult.  This patient is medically critical high risk for recurrent strokes and TIAs and neurological worsening. This patient's care requires constant monitoring of vital signs, hemodynamics, respiratory and cardiac monitoring, review of multiple databases, neurological assessment, discussion with family, other specialists and medical decision making of high complexity. I spent 35 min in taking care of this pt.  Rosalin Hawking, MD PhD Stroke Neurology 10/24/2016 4:00 PM   To contact Stroke Continuity provider, please refer to [email or web address] After hours, contact General Neurology

## 2016-10-23 NOTE — Progress Notes (Signed)
Contrast extravasation consultation: I was called to assess a contrast extravasation in the left antecubital region of about 50 cc of contrast that occurred during scanning of the patient's head.    Type of contrast: [Isovue 300]  Site of extravasation: [Left antecubital]  Estimated volume of extravasation: [50] ml Area of extravasation scanned with CT? [no]  PATIENT'S SIGNS AND SYMPTOMS   Skin blistering/ulceration: [no] Decrease capillary refill: [no] Change in skin color: [no]  Decreased motor function or severe tightness: [no]  Decreased pulses distal to site of extravasation: [no]  Altered sensation: [Could not be assessed as the patient is not able to respond  Increasing pain or signs of increased swelling during observation: [no]   TREATMENT   Observation period at site: [5 minutes]  Limb elevation: [yes] Ice packs applied: [yes] Heat pads applied: [No  Plastic surgery consulted? [no]  DOCUMENTATION AND FOLLOW-UP   Site contrast extravasation forms submitted? [yes] Post extravasation orders completed? [yes] Was additional follow up assigned to PA's? [Yes  Patient's questions answered? [Not applicable -the patient has altered mental status.]

## 2016-10-23 NOTE — ED Triage Notes (Signed)
To ED via [facility] for eval of increased weakness since waking this am. Pt has hx of stroke and right side deficit. Pt's friend in home called EMS due to pts inability to walk and increased weakness. Per EMS pt was initially able to move right side but on arrival to ED right side flaccid. After EMS left was able to move right side (arm and leg) - only lasted a few minutes. Pt normally with slurred speech but able to communicate. Now just making sounds.

## 2016-10-23 NOTE — Code Documentation (Addendum)
51yo male arriving to [facility] via [facility] at 0900.  Patient was LKW yesterday at 2100 prior to bed.  Patient with h/o stroke with no residual deficits per family.  Patient with h/o substance abuse.  Patient woke up this morning nonverbal with right sided weakness.  Patient transported to the ED where a code stroke was called.  Stroke team to the bedside.  Patient to CT with team.  Patient agitated requiring Ativan 2mg  IVP.  CT completed followed by CTP and CTA.  Patient with contrast extravasation to LUE, assessed by radiologist.  NIHSS 6 initially, now NIHSS 3, see documentation for details and code stroke times.  Patient initially with right sided weakness which waxed and waned.  Patient with continued severe dysarthria.  Patient is outside the window for treatment with tPA.  BP treated per Dr. Erlinda Hong.  Patient back to CT for repeat CTA.  Patient back to E37.  Patient's friend at the bedside reports patient had a seizure last night.  Dr. Erlinda Hong aware that imaging is complete.  No acute stroke treatment at this time.  Bedside handoff with ED RN Baldo Ash.

## 2016-10-23 NOTE — ED Notes (Signed)
PT returns from CT after contrast infiltrated 18G IV in left AC. Another 18G IV to be obtained and PT will return to CT. Radiologist saw PT after infiltration and ice pack placed on site. Will attempt to elevate site if PT will comply

## 2016-10-23 NOTE — Consult Note (Signed)
Stroke Consult Note  Referring Physician: Dr. Vanita Panda    Chief Complaint: slurry speech and right sided weakness after woke up this am  HPI: Eric Morton is an 51 y.o. male with Hx of HTN, HLD, DM, cocaine abuse, smoker, stroke in 02/2015 presented at code stroke.   As per girlfriend and his other family member, he went to sleep last night at 10pm at baseline. Woke up this morning, very slurred speech to the point not able to make words. Right side weakness and not able to walk to bathroom. He was sent to ER with his girlfriend. In ER, pt developed aphasia and right sided flaccid. Code stroke LVO called. On arrival, as per Dr. Vanita Panda, his speech and right sided weakness has improved much, although not back to normal. Went to CT head negative for ICH or acute abnormalities. NIHSS =3 with only 1 on month and 2 on slurry speech. CTA h/n/p not able to do due to agitation even with 63m ativan and IV extravagation. Back to ED room, pt found again to be severe dysarthria vs. Aphasia, right side worsening weakness. BP 225/125. Ordered labetalol one dose and plan back to CT for CTA h/n/p.   He had right BG stroke in 02/2015 and MRA mild athro, CUS and TTE unremarkable. A1C 8.2 and LDL 120 and he refused statin that time. His UDS showed positive for cocaine and THC. He was discharged with ASA. Over the time, as per family, he seems not significant neuro deficit. He still doing "street drug".   LSN: 10pm last night tPA Given: No: out of window  Past Medical History:  Diagnosis Date  . 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 Anxiety   . Chronic kidney disease    "jsut a little damage"  . Glaucoma   . History of stomach ulcers    not confirmed, never underwent diagnostic endo or radiology, this manifested as epigastric distress.   .Marland KitchenHx of adenomatous colonic polyps   . Hypercholesterolemia   . Hypertension   . Peripheral neuropathy   . Polycythemia vera ([place] 2006  . Substance  abuse   . Type II diabetes mellitus ([place]     Past Surgical History:  Procedure Laterality Date  . COLONOSCOPY WITH PROPOFOL N/A 04/28/2014   Procedure: COLONOSCOPY WITH PROPOFOL;  Surgeon: CGatha Mayer MD;  [facility]  Service: Endoscopy;  Laterality: N/A;  . IRRIGATION AND DEBRIDEMENT ABSCESS  09/26/2011   Procedure: IRRIGATION AND DEBRIDEMENT ABSCESS;  Surgeon: MImogene Burn TGeorgette Dover MD;  [facility]  Service: General;  Laterality: Left;  Incision and Drainage left breast abscess    Family History  Problem Relation Age of Onset  . Diabetes Mother   . Hypertension Mother    Social History:  reports that he has been smoking Cigarettes.  He has a 8.00 pack-year smoking history. He has never used smokeless tobacco. He reports that he drinks alcohol. He reports that he uses drugs, including Marijuana and "Crack" cocaine.  Allergies: No Known Allergies  Medications:  Scheduled: . iopamidol        ROS: Review of Systems: ROS was attempted today and was able to be performed.  Systems assessed include - Constitutional, Eyes, HENT, Respiratory, Cardiovascular, Gastrointestinal, Genitourinary, Integument/breast, Hematologic/lymphatic, Musculoskeletal, Neurological, Behavioral/Psych, Endocrine,  Allergic/Immunologic - the patient complains of only the following symptoms, and all other reviewed systems are negative.  Physical Examination: Blood pressure (!) 212/141, pulse (!) 106, temperature 98.6  F (37 C), temperature source Oral, resp. rate (!) 25, SpO2 100 %.   Temp:  [98.6 F (37 C)] 98.6 F (37 C) (09/27 0910) Pulse Rate:  [95-106] 95 (09/27 1055) Resp:  [12-25] 12 (09/27 1055) BP: (176-212)/(101-141) 206/101 (09/27 1055) SpO2:  [97 %-100 %] 100 % (09/27 1055)  General - Well nourished, well developed, sleepy drowsy after ativan.  Ophthalmologic - Fundi not visualized due to noncooperation.  Cardiovascular - Regular rate and rhythm.  Neuro - sleepy and  drowsy, still arousable after ativan. Severe dysarthria, still able to say his name. Able to follow several simple commands, did not name or repeat. Eyes attending to both side, blinking to visual threat bilaterally, right facial droop. Right UE 2/5 on pain, RLE 4/5 spontaneously. LUE and LLE strong and active moving. DTR 1+ and no babainski. Sensation, coordination and gait not tested.  NIH Stroke Scale  Level Of Consciousness 0=Alert; keenly responsive 1=Not alert, but arousable by minor stimulation 2=Not alert, requires repeated stimulation 3=Responds only with reflex movements 1  LOC Questions to Month and Age 57=Answers both questions correctly 1=Answers one question correctly 2=Answers neither question correctly 1  LOC Commands      -Open/Close eyes     -Open/close grip 0=Performs both tasks correctly 1=Performs one task correctly 2=Performs neighter task correctly 1  Best Gaze 0=Normal 1=Partial gaze palsy 2=Forced deviation, or total gaze paresis 0  Visual 0=No visual loss 1=Partial hemianopia 2=Complete hemianopia 3=Bilateral hemianopia (blind including cortical blindness) 0  Facial Palsy 0=Normal symmetrical movement 1=Minor paralysis (asymmetry) 2=Partial paralysis (lower face) 3=Complete paralysis (upper and lower face) 1  Motor  0=No drift, limb holds posture for full 10 seconds 1=Drift, limb holds posture, no drift to bed 2=Some antigravity effort, cannot maintain posture, drifts to bed 3=No effort against gravity, limb falls 4=No movement Right Arm 2     Leg 1    Left Arm 0     Leg 0  Limb Ataxia 0=Absent 1=Present in one limb 2=Present in two limbs 0  Sensory 0=Normal 1=Mild to moderate sensory loss 2=Severe to total sensory loss 0  Best Language 0=No aphasia, normal 1=Mild to moderate aphasia 2=Mute, global aphasia 3=Mute, global aphasia 1  Dysarthria 0=Normal 1=Mild to moderate 2=Severe, unintelligible or mute/anarthric 2  Extinction/Neglect 0=No  abnormality 1=Extinction to bilateral simultaneous stimulation 2=Profound neglect 0  Total   10     Results for orders placed or performed during the hospital encounter of 10/23/16 (from the past 48 hour(s))  APTT     Status: None   Collection Time: 10/23/16  9:06 AM  Result Value Ref Range   aPTT 26 24 - 36 seconds  Comprehensive metabolic panel     Status: Abnormal   Collection Time: 10/23/16  9:06 AM  Result Value Ref Range   Sodium 136 135 - 145 mmol/L   Potassium 4.0 3.5 - 5.1 mmol/L   Chloride 102 101 - 111 mmol/L   CO2 24 22 - 32 mmol/L   Glucose, Bld 189 (H) 65 - 99 mg/dL   BUN 5 (L) 6 - 20 mg/dL   Creatinine, Ser 1.02 0.61 - 1.24 mg/dL   Calcium 9.2 8.9 - 10.3 mg/dL   Total Protein 7.3 6.5 - 8.1 g/dL   Albumin 3.2 (L) 3.5 - 5.0 g/dL   AST 24 15 - 41 U/L   ALT 17 17 - 63 U/L   Alkaline Phosphatase 75 38 - 126 U/L   Total Bilirubin 0.8 0.3 -  1.2 mg/dL   GFR calc non Af Amer >60 >60 mL/Morton   GFR calc Af Amer >60 >60 mL/Morton    Comment: (NOTE) The eGFR has been calculated using the CKD EPI equation. This calculation has not been validated in all clinical situations. eGFR's persistently <60 mL/Morton signify possible Chronic Kidney Disease.    Anion gap 10 5 - 15  CBC WITH DIFFERENTIAL     Status: Abnormal   Collection Time: 10/23/16  9:06 AM  Result Value Ref Range   WBC 5.4 4.0 - 10.5 K/uL   RBC 5.35 4.22 - 5.81 MIL/uL   Hemoglobin 14.9 13.0 - 17.0 g/dL   HCT 46.1 39.0 - 52.0 %   MCV 86.2 78.0 - 100.0 fL   MCH 27.9 26.0 - 34.0 pg   MCHC 32.3 30.0 - 36.0 g/dL   RDW 13.3 11.5 - 15.5 %   Platelets 147 (L) 150 - 400 K/uL   Neutrophils Relative % 34 %   Neutro Abs 1.9 1.7 - 7.7 K/uL   Lymphocytes Relative 49 %   Lymphs Abs 2.6 0.7 - 4.0 K/uL   Monocytes Relative 9 %   Monocytes Absolute 0.5 0.1 - 1.0 K/uL   Eosinophils Relative 7 %   Eosinophils Absolute 0.4 0.0 - 0.7 K/uL   Basophils Relative 1 %   Basophils Absolute 0.0 0.0 - 0.1 K/uL  Ethanol     Status:  None   Collection Time: 10/23/16  9:06 AM  Result Value Ref Range   Alcohol, Ethyl (B) <10 <10 mg/dL    Comment:        LOWEST DETECTABLE LIMIT FOR SERUM ALCOHOL IS 10 mg/dL FOR MEDICAL PURPOSES ONLY Please note change in reference range.   Protime-INR     Status: None   Collection Time: 10/23/16  9:06 AM  Result Value Ref Range   Prothrombin Time 12.5 11.4 - 15.2 seconds   INR 0.94   Urine rapid drug screen (hosp performed)not at [facility]     Status: Abnormal   Collection Time: 10/23/16  9:18 AM  Result Value Ref Range   Opiates NONE DETECTED NONE DETECTED   Cocaine POSITIVE (A) NONE DETECTED   Benzodiazepines NONE DETECTED NONE DETECTED   Amphetamines NONE DETECTED NONE DETECTED   Tetrahydrocannabinol NONE DETECTED NONE DETECTED   Barbiturates NONE DETECTED NONE DETECTED    Comment:        DRUG SCREEN FOR MEDICAL PURPOSES ONLY.  IF CONFIRMATION IS NEEDED FOR ANY PURPOSE, NOTIFY LAB WITHIN 5 DAYS.        LOWEST DETECTABLE LIMITS FOR URINE DRUG SCREEN Drug Class       Cutoff (ng/mL) Amphetamine      1000 Barbiturate      200 Benzodiazepine   242 Tricyclics       353 Opiates          300 Cocaine          300 THC              50   Urinalysis, Routine w reflex microscopic (not at [facility])     Status: Abnormal   Collection Time: 10/23/16  9:18 AM  Result Value Ref Range   Color, Urine STRAW (A) YELLOW   APPearance CLEAR CLEAR   Specific Gravity, Urine 1.009 1.005 - 1.030   pH 7.0 5.0 - 8.0   Glucose, UA 50 (A) NEGATIVE mg/dL   Hgb urine dipstick SMALL (A) NEGATIVE   Bilirubin Urine NEGATIVE NEGATIVE  Ketones, ur NEGATIVE NEGATIVE mg/dL   Protein, ur >=300 (A) NEGATIVE mg/dL   Nitrite NEGATIVE NEGATIVE   Leukocytes, UA NEGATIVE NEGATIVE   RBC / HPF 0-5 0 - 5 RBC/hpf   WBC, UA 0-5 0 - 5 WBC/hpf   Bacteria, UA NONE SEEN NONE SEEN   Squamous Epithelial / LPF 0-5 (A) NONE SEEN   Hyaline Casts, UA PRESENT   I-stat troponin, ED     Status: None   Collection Time: 10/23/16  10:04 AM  Result Value Ref Range   Troponin i, poc 0.01 0.00 - 0.08 ng/mL   Comment 3            Comment: Due to the release kinetics of cTnI, a negative result within the first hours of the onset of symptoms does not rule out myocardial infarction with certainty. If myocardial infarction is still suspected, repeat the test at appropriate intervals.   I-stat chem 8, ed     Status: Abnormal   Collection Time: 10/23/16 10:05 AM  Result Value Ref Range   Sodium 138 135 - 145 mmol/L   Potassium 4.6 3.5 - 5.1 mmol/L   Chloride 103 101 - 111 mmol/L   BUN 7 6 - 20 mg/dL   Creatinine, Ser 0.90 0.61 - 1.24 mg/dL   Glucose, Bld 196 (H) 65 - 99 mg/dL   Calcium, Ion 1.09 (L) 1.15 - 1.40 mmol/L   TCO2 27 22 - 32 mmol/L   Hemoglobin 16.3 13.0 - 17.0 g/dL   HCT 48.0 39.0 - 52.0 %   Ct Head Code Stroke Wo Contrast`  Result Date: 10/23/2016 CLINICAL DATA:  Code stroke. 51 year old male with altered mental status, right arm flaccid. Last known normal last night. EXAM: CT HEAD WITHOUT CONTRAST TECHNIQUE: Contiguous axial images were obtained from the base of the skull through the vertex without intravenous contrast. COMPARISON:  Brain MRI, intracranial MRA and head CT 03/21/2015 FINDINGS: Brain: Chronic deep gray matter, left cerebellar, and scattered white matter lacunar infarcts as demonstrated on the 2017 MRI. Expected Evolution of the 2017 right corona radiata infarct. Gray-white matter differentiation elsewhere appears stable. No cortically based acute infarct identified. No acute intracranial hemorrhage identified. No midline shift, mass effect, or evidence of intracranial mass lesion. No ventriculomegaly. Vascular: Calcified atherosclerosis at the skull base. No suspicious intracranial vascular hyperdensity. Skull: Chronic right lamina papyracea fracture. No acute osseous abnormality identified. Sinuses/Orbits: Chronic ethmoid opacification. Chronic right frontoethmoidal recess opacification. Stable  paranasal sinuses. Mastoids and tympanic cavities remain clear. Other: Mild leftward gaze deviation. No acute orbit or scalp soft tissue findings. ASPECTS [facility] Stroke Program Early CT Score) - Ganglionic level infarction (caudate, lentiform nuclei, internal capsule, insula, M1-M3 cortex): 7 - Supraganglionic infarction (M4-M6 cortex): 3 Total score (0-10 with 10 being normal): 10 IMPRESSION: 1. 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 No acute cortically based infarct or acute intracranial hemorrhage identified. 2. ASPECTS is 10. 3. The above was relayed via text pager to Dr. Erlinda Hong On 10/23/2016 at 10:16 . Electronically Signed   By: Genevie Ann M.D.   On: 10/23/2016 10:16    Assessment: 51 y.o. male with hx of HTN, HLD, DM, cocaine abuse, smoker, stroke in 02/2015 presented at code stroke. Work up slurry and right side weakness this am. LSN 10pm last night. In ER symptoms fluctuate, much improved in CT but worse again after returning to ED room after CT. Agitated in CT and received ativan 15m iv. CT head no  ICH. CTP did not show perfusion deficit. CTA had IV extravagation. Due to the fluctuation of symptoms, will need CTA head and neck again.   He had right BG stroke in 02/2015 and MRA mild athro, CUS and TTE unremarkable. A1C 8.2 and LDL 120 and he refused statin that time. His UDS showed positive for cocaine and THC. He was discharged with ASA. Over the time, as per family, he seems not significant deficit.  Stroke Risk Factors - diabetes mellitus, hyperlipidemia, hypertension, smoking and drug use  Plan: 1. Stat CT head and neck to evaluate LVO 2. MRI the brain without contrast 3. PT consult, OT consult, Speech consult 4. Echocardiogram 5. UDS, lipid panel and A1C. 6. Prophylactic therapy - will initiate antiplatelet if no intervention needed 7. Risk factor modification 8. Telemetry monitoring 9. Frequent neuro checks  Case discussed with Dr. Vanita Panda in  ER  This patient is critically ill due to code stroke and at significant risk of neurological worsening, death form recurrent stroke, cerebral edema, seizure. This patient's care requires constant monitoring of vital signs, hemodynamics, respiratory and cardiac monitoring, review of multiple databases, neurological assessment, discussion with family, other specialists and medical decision making of high complexity. I spent 50 minutes of neurocritical care time in the care of this patient.  Rosalin Hawking, MD PhD Stroke Neurology 10/23/2016 11:12 AM

## 2016-10-23 NOTE — ED Notes (Signed)
Patient transported to CT 

## 2016-10-23 NOTE — ED Notes (Signed)
Dr. Vanita Panda made aware of decreased use of right arm and leg since last neuro check. Neuro status has waxed and waned since arrival.

## 2016-10-23 NOTE — H&P (Signed)
Eric Morton Service Pager: [phone]  Patient name: Eric Morton Medical record number: [id number] Date of birth: 1965-11-20 Age: 51 y.o. Gender: male  Primary Care Provider: Arnoldo Morale, MD Consultants: Neurology Code Status: FULL  Chief Complaint: Right sided weakness   Assessment and Plan: [name] is a 51 y.o. male presenting with right sided weakness and aphasia. PMH is significant for CKD, HTN, HLD, polysubstance abuse, polycythemia vera, DM2. Patient was given 81Mg  of aspirin in ED and started on 0.9% NaCl infusion.    Right sided weakness/aphasia:  Hx of CVA. At baseline the patient has right sided weakness and slurred speech. Markedly worsened today per girlfriend. Patient is able to grunt to answer questions. Code stroke was called in ED. CT head negative for acute stroke. - admit to telemetry, attending Dr. Erin Hearing - neurology consulted appreciate recommendations  - MRI brain  - neuro checks per protocol - Patient began PO aspirin 325 mg and PO Plavix 75 mg, daily per Dr. Erlinda Hong.  - NG tube in place, patient unable to swallow.   - Atorvastatin per NG (if possible)  - permissive HTN for the first 24 hours. Hydralazine PRN with parameters ordered by neurology  - ECHO - PT/OT/ SLP   HLD: Patient not compliant with home medications. Lipid panel ordered. Abnormal triglycerides 161. All other values WNL.  - high intensity statin indicated due to hx of stroke. Lipitor 40mg    HTN: Patient not compliant with home medication, Lisinopril 20mg  daily. BP 212/141 on admission, decreased to 195/113.  Troponin 0.01 (9/27).  - permissive HTN as noted above   DM: A1c (9/27) 8.9. Plasma glucose 208.7 - Sensitive SSI with q4 hr cbgs while NPO  ETOH abuse: Patient's girlfriend reports his use of beer a day. History of withdrawals in past but denies history of seizure. Serum alcohol ,10mg /dL. Last drink 9/26.  - CIWA  protocol  - Thiamine, folate  Polycythemia  Last phlebotomy on 8/28. Hemat today 46, goal is <45.  - monitor  Neuropathy:  Patient is taking gabapentin 300mg  BID at home - will hold for now  FEN/GI: NPO until evaluated by PT/OT Prophylaxis: SCDs   Disposition: Admit to telemetry  History of Present Illness:  Eric Morton is a 51 y.o. male presenting with right sided weakness and aphasia beginning around 10 am this morning. Patient was at his baseline yesterday evening before bed. Patients girlfriend is at bedside, speaking on behalf of patient. Patient had a previous stroke 2 years ago, with persisting right sided weakness and slurred speech at baseline since then. Currently he has marked deficits from his baseline speech and weakness. This morning the patients girlfriend noted the patient to be drooling and "not speaking clearly". She states the patient had "dropping lips" on the right as well as weakness and numbness along the upper and lower right side of his body. Patient seemed to be unstable with ambulation. She states the patient has not been taking his BP or DM medications for the past week "because it makes him feel funny". Patient denies fever, headaches, chest pain or current pain, but patient history limited due to aphasia. Patient groans to answer questions.   ED physician states the patient has been waxing and waning acuity since arrival to ED. Code stroke was called in the ED and neurology was involved. CT head was negative for acute process.   Review Of Systems: Per HPI with the following additions:  Review of Systems  Unable to perform ROS: Acuity of condition  Constitutional: Negative for diaphoresis and fever.  Respiratory: Negative for cough.   Cardiovascular: Negative for chest pain and leg swelling.  Neurological: Positive for speech change and weakness.    Patient Active Problem List   Diagnosis Date Noted  . Left-sided weakness 10/23/2016  . Hyperlipidemia  07/27/2015  . Cerebrovascular accident (CVA) due to occlusion of cerebral artery ([place])   . Stroke ([place]) 03/21/2015  . Polycythemia vera ([place]) 03/21/2015  . Hx of adenomatous colonic polyps   . Dehydration   . Diarrhea   . Protein-calorie malnutrition ([place])   . Cellulitis of breast of male 09/25/2011  . HTN (hypertension) 09/25/2011  . DM (diabetes mellitus) ([place]) 09/25/2011  . Leukopenia 09/25/2011  . ETOH abuse 09/25/2011  . Cocaine abuse 09/25/2011  . Tobacco abuse 09/25/2011  . Neuropathy 09/25/2011    Past Medical History: Past Medical History:  Diagnosis Date  . 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 Anxiety   . Chronic kidney disease    "jsut a little damage"  . Glaucoma   . History of stomach ulcers    not confirmed, never underwent diagnostic endo or radiology, this manifested as epigastric distress.   [name] Hx of adenomatous colonic polyps   . Hypercholesterolemia   . Hypertension   . Peripheral neuropathy   . Polycythemia vera ([place]) 2006  . Substance abuse   . Type II diabetes mellitus ([place])     Past Surgical History: Past Surgical History:  Procedure Laterality Date  . COLONOSCOPY WITH PROPOFOL N/A 04/28/2014   Procedure: COLONOSCOPY WITH PROPOFOL;  Surgeon: Gatha Mayer, MD;  Location: [place] [facility];  Service: Endoscopy;  Laterality: N/A;  . IRRIGATION AND DEBRIDEMENT ABSCESS  09/26/2011   Procedure: IRRIGATION AND DEBRIDEMENT ABSCESS;  Surgeon: Imogene Burn. Georgette Dover, MD;  Location: [facility];  Service: General;  Laterality: Left;  Incision and Drainage left breast abscess    Social History: Social History  Substance Use Topics  . Smoking status: Current Some Day Smoker    Packs/day: 0.25    Years: 32.00    Types: Cigarettes  . Smokeless tobacco: Never Used  . Alcohol use Yes     Comment: 3/14 quit drinking 5 days ago and then drank last nite   Additional social history: Patient lives at home with his girlfriend. He is currently applying for  disability.  Please also refer to relevant sections of EMR.  Family History: Family History  Problem Relation Age of Onset  . Diabetes Mother   . Hypertension Mother     Allergies and Medications: No Known Allergies No current facility-administered medications on file prior to encounter.    Current Outpatient Prescriptions on File Prior to Encounter  Medication Sig Dispense Refill  . aspirin EC 81 MG tablet Take 1 tablet (81 mg total) by mouth daily. 30 tablet 2  . folic acid (FOLVITE) 1 MG tablet Take 1 tablet (1 mg total) by mouth daily. 30 tablet 1  . gabapentin (NEURONTIN) 300 MG capsule Take 2 capsules (600 mg total) by mouth 2 (two) times daily. 120 capsule 5  . glipiZIDE (GLUCOTROL) 5 MG tablet Take 1 tablet (5 mg total) by mouth 2 (two) times daily before a meal. 60 tablet 5  . lisinopril (PRINIVIL,ZESTRIL) 20 MG tablet Take 1 tablet (20 mg total) by mouth daily. 30 tablet 5  . metFORMIN (GLUCOPHAGE) 1000 MG tablet Take 1 tablet (1,000  mg total) by mouth 2 (two) times daily with a meal. 60 tablet 5  . pantoprazole (PROTONIX) 40 MG tablet Take 1 tablet (40 mg total) by mouth daily at 6 (six) AM. 30 tablet 0  . simvastatin (ZOCOR) 40 MG tablet Take 1 tablet (40 mg total) by mouth at bedtime. (Patient not taking: Reported on 09/23/2016) 30 tablet 5  . thiamine 100 MG tablet Take 1 tablet (100 mg total) by mouth daily. 30 tablet 2    Objective: BP (!) 191/111   Pulse 100   Resp 11   SpO2 100%  Exam: General: NAD. Intermittently sleepy (recieved Ativan for his CT) . Unable to answer questions with words. Uses grunts or head nodding to respond. Drooling with mouth open.  Eyes: Pupils equal, round and reactive to light.  Cardiovascular: RRR with S1/S2.  Respiratory: CTA bilaterally. No wheezing or rales.  Gastrointestinal: Soft, non-tender, BS present.  MSK: Patient neglecting right side of body. Unable to move right side. Full movement of left upper and lower extremity.  Derm:  Warm, dry  Neuro: Limited due to patients mental status. GCS- Eye 3; Verbal-2; Motor 6. CN II-VI intact. Unable to assess CN V-XII at this time. Sensation in upper and lower extremity intact. Finger to nose abnormal on left.   Labs and Imaging: CBC BMET   Recent Labs Lab 10/23/16 0906 10/23/16 1005  WBC 5.4  --   HGB 14.9 16.3  HCT 46.1 48.0  PLT 147*  --     Recent Labs Lab 10/23/16 0906 10/23/16 1005  NA 136 138  K 4.0 4.6  CL 102 103  CO2 24  --   BUN 5* 7  CREATININE 1.02 0.90  GLUCOSE 189* 196*  CALCIUM 9.2  --       Alisa Graff, Medical Student 10/23/2016, 1:38 PM [place] Intern pager: [phone], text pages welcome  UPPER LEVEL ADDENDUM  I have read the above note and made revisions highlighted in blue.  Smiley Houseman, MD PGY-3 [facility] Pager [phone]

## 2016-10-23 NOTE — ED Notes (Signed)
Per 5W RN, inpatient bed not available. Per charge RN will transport pt on stretcher.

## 2016-10-23 NOTE — Progress Notes (Signed)
NG tube placement attempted, pt unable to swallow. Attempt unsuccessful. Pt refused further attempt. MD notified. MD at the bedside.

## 2016-10-23 NOTE — ED Notes (Signed)
Report given to R[place] RN

## 2016-10-24 ENCOUNTER — Inpatient Hospital Stay (HOSPITAL_COMMUNITY): Payer: Medicaid Other

## 2016-10-24 ENCOUNTER — Other Ambulatory Visit: Payer: No Typology Code available for payment source

## 2016-10-24 DIAGNOSIS — Z789 Other specified health status: Secondary | ICD-10-CM

## 2016-10-24 DIAGNOSIS — I639 Cerebral infarction, unspecified: Principal | ICD-10-CM

## 2016-10-24 DIAGNOSIS — F1490 Cocaine use, unspecified, uncomplicated: Secondary | ICD-10-CM

## 2016-10-24 DIAGNOSIS — E118 Type 2 diabetes mellitus with unspecified complications: Secondary | ICD-10-CM

## 2016-10-24 LAB — BASIC METABOLIC PANEL
ANION GAP: 8 (ref 5–15)
BUN: 9 mg/dL (ref 6–20)
CHLORIDE: 104 mmol/L (ref 101–111)
CO2: 26 mmol/L (ref 22–32)
Calcium: 8.9 mg/dL (ref 8.9–10.3)
Creatinine, Ser: 1.08 mg/dL (ref 0.61–1.24)
GFR calc non Af Amer: >60 mL/min (ref >60)
GLUCOSE: 130 mg/dL — AB (ref 65–99)
Potassium: 3.5 mmol/L (ref 3.5–5.1)
Sodium: 138 mmol/L (ref 135–145)

## 2016-10-24 LAB — CBC
HEMATOCRIT: 42.9 % (ref 39.0–52.0)
HEMOGLOBIN: 14.2 g/dL (ref 13.0–17.0)
MCH: 28.5 pg (ref 26.0–34.0)
MCHC: 33.1 g/dL (ref 30.0–36.0)
MCV: 86.1 fL (ref 78.0–100.0)
Platelets: 138 10*3/uL — ABNORMAL LOW (ref 150–400)
RBC: 4.98 MIL/uL (ref 4.22–5.81)
RDW: 13.1 % (ref 11.5–15.5)
WBC: 6.5 10*3/uL (ref 4.0–10.5)

## 2016-10-24 LAB — HIV ANTIBODY (ROUTINE TESTING W REFLEX): HIV SCREEN 4TH GENERATION: NONREACTIVE

## 2016-10-24 LAB — GLUCOSE, CAPILLARY
GLUCOSE-CAPILLARY: 127 mg/dL — AB (ref 65–99)
GLUCOSE-CAPILLARY: 141 mg/dL — AB (ref 65–99)
GLUCOSE-CAPILLARY: 150 mg/dL — AB (ref 65–99)
GLUCOSE-CAPILLARY: 191 mg/dL — AB (ref 65–99)
Glucose-Capillary: 133 mg/dL — ABNORMAL HIGH (ref 65–99)
Glucose-Capillary: 163 mg/dL — ABNORMAL HIGH (ref 65–99)
Glucose-Capillary: 148 mg/dL — ABNORMAL HIGH (ref 65–99)

## 2016-10-24 MED ORDER — SODIUM CHLORIDE 0.9 % IV SOLN
INTRAVENOUS | Status: DC
  Administered 2016-10-24 – 2016-10-25 (×2): via INTRAVENOUS

## 2016-10-24 MED ORDER — ROSUVASTATIN CALCIUM 20 MG PO TABS
40.0000 mg | ORAL_TABLET | Freq: Every day | ORAL | Status: DC
  Administered 2016-10-25 – 2016-10-26 (×2): 40 mg via ORAL
  Filled 2016-10-24 (×3): qty 2

## 2016-10-24 MED ORDER — RESOURCE THICKENUP CLEAR PO POWD
ORAL | Status: DC | PRN
  Filled 2016-10-24 (×2): qty 125

## 2016-10-24 MED ORDER — ENOXAPARIN SODIUM 40 MG/0.4ML ~~LOC~~ SOLN
40.0000 mg | SUBCUTANEOUS | Status: DC
  Administered 2016-10-24 – 2016-10-27 (×4): 40 mg via SUBCUTANEOUS
  Filled 2016-10-24 (×4): qty 0.4

## 2016-10-24 MED ORDER — ATORVASTATIN CALCIUM 80 MG PO TABS
80.0000 mg | ORAL_TABLET | Freq: Every day | ORAL | Status: DC
  Filled 2016-10-24: qty 1

## 2016-10-24 NOTE — Progress Notes (Signed)
OT Cancellation Note  Patient Details Name: Eric Morton MRN: [id number] DOB: 1965-09-05   Cancelled Treatment:    Reason Eval/Treat Not Completed: Patient at procedure or test/ unavailable. Pt currently out of room for swallow test, will re-attempt eval at later time.  Almon Register [phone] 10/24/2016, 10:40 AM

## 2016-10-24 NOTE — Progress Notes (Signed)
Pt agitated, unable to sleep through night, Ativan 1 mg given.

## 2016-10-24 NOTE — Progress Notes (Signed)
Modified Barium Swallow Progress Note  Patient Details  Name: Eric Morton MRN: [id number] Date of Birth: [birth year]  Today's Date: 10/24/2016  Modified Barium Swallow completed.  Full report located under Chart Review in the Imaging Section.  Brief recommendations include the following:  Clinical Impression  Pt exhibited significantly decreased lingual cohesion/control leading to anterior spill, anterior sulci residue, holding, piecemeal swallow pattern and lingual residue. Laryngeal penetration before the swallow due to poor oral control reaching vocal cords, silently with nectar. Penetration during multiple swallows with nectar cleared vestibule. Pharyngeal residuals from mod-max given motor weakness. Requires max cues for volitional swallow that reduce quantity. Pt is impulsive and requires full supervisio with po's. Recommend Dys 1, honey via TEASPO[place] only, crush meds, sit upright, 2 swallows, check oral cavity for pocketing.         Swallow Evaluation Recommendations       SLP Diet Recommendations: Dysphagia 1 (Puree) solids;Honey thick liquids   Liquid Administration via: Spoon   Medication Administration: Crushed with puree   Supervision: Patient able to self feed;Full supervision/cueing for compensatory strategies   Compensations: Slow rate;Minimize environmental distractions;Small sips/bites;Lingual sweep for clearance of pocketing;Monitor for anterior loss;Multiple dry swallows after each bite/sip   Postural Changes: Seated upright at 90 degrees   Oral Care Recommendations: Oral care BID        Houston Siren 10/24/2016,12:25 PM   Orbie Pyo Colvin Caroli.Ed [employer] [phone]

## 2016-10-24 NOTE — Progress Notes (Signed)
*  PRELIMINARY RESULTS* Vascular Ultrasound Bilateral lower extremity venous duplex has been completed.  Preliminary findings: No evidence of deep vein thrombosis or baker's cysts bilaterally.  Everrett Coombe 10/24/2016, 3:41 PM

## 2016-10-24 NOTE — Progress Notes (Signed)
Family Medicine Teaching Service Daily Progress Note Intern Pager: [phone]  Patient name: Eric Morton Medical record number: [id number] Date of birth: Feb 18, 1965 Age: 51 y.o. Gender: male  Primary Care Provider: Arnoldo Morale, MD Consultants: Neuro Code Status: FULL  Pt Overview and Major Events to Date:  Eric Morton is a 51 y.o. Male who presented 9/27 for evaluation of right sided weakness and aphasia beginning around 10 am yesterday. Upon presentation to the ED, code stroke was called and neurology was involved. Patient was given 81Mg  of aspirin in ED and started on 0.9% NaCl infusion. CT head was negative for acute process. MRI displayed numerous acute supra- and infratentorial infarcts. Distribution suggesting emobolic etiology. Patient was aphasic on evaluation and unable to answer many questions appropriately. He was noted to have some deficits in right sided weakness and slurred speech at baseline due to a previous stroke, however he now has marked deficits from his baseline. Patient was started on CIWA protocol for due to history of ETOH abuse. An NG tube was placed as the patient was unable to swallow is medications. The patient moving towards a positive improvement, able to move right side of body and increased strength. Patient is better able to use gestures to address wishes. Speech has still not returned to his baseline. Cousin and girlfriend are at bedside.   Assessment and Plan: Eric Morton is a 51 y.o. male presenting with right sided weakness and aphasia. PMH is significant for CKD, HTN, HLD, polysubstance abuse, polycythemia vera, DM2.    Right sided weakness/aphasia:  Hx of CVA. At baseline has right sided weakness and slurred speech, but markedly worsened per girlfriend. Patient is able to grunt to answer questions.  - neurology consulted, appreciate recommendations  - MRI brain suggesting embolic etiology  -An echo was ordered 9/27. Will asses for PFO - [place] U/S were  ordered to r/o DVT emboli as cause, Dr. Erlinda Hong - neuro checks per protocol - Patient began PO aspirin 325 mg and PO Plavix 75 mg, daily per Dr. Erlinda Hong.  -Previous NG tube placement became coiled in pts mouth and was DC. Patient removed second NG tube. Consider placing new tube today, cortrak. We will evaluate swallowing with speech therapy prior to placement.  -High intensity statin-atorvastatin per NG  -Permissive HTN until 24 hours of admission. Then, hydralazine PRN according to parameters per neurology     HLD: Patient not compliant with home medications. Lipid panel ordered. Abnormal triglycerides 161. All other values WNL.  Start high intensity statin, lipitor 40mg   HTN: Patient not compliant with home medication, Lisinopril 20mg  daily. BP 212/141 on admission, decreased to 195/113.  Troponin 0.01 (9/27).  - permissive HTN as noted above   DM: A1c (9/27) 8.9. Plasma glucose 208.7 -Sensitive SSI w/ q4h cbgs while patient is NPO   ETOH abuse: Patient notes history of withdrawals in past but denies history of seizure. Serum alcohol ,10mg /dL. Last drink 9/26.   - CIWA protocol  - Thiamine, folate  Polycythemia  Last phlebotomy on 8/28. Hemat today 46, goal is <45.  - monitor  Neuropathy:  Patient is taking gabapentin 300mg  BID at home - will hold for now  FEN/GI: NPO  PPx: SCDs  Disposition: Continued management and refer to neurology recommendations   Subjective:  Patient is a 51 year old male who presented 9/27 for evaluation of right sided weakness and aphasia. Today the patient has improved slightly from his mental acuity yesterday. He is able to gesture his wishes  appropriately. Able to maintain eye contact and follow commands. He denies any pain, but seems to be in distress d/t inability to communicate at his baseline. Patient motioned ability to swallow and was able to swallow once on command. He denies any chest pain, headache, abdominal pain or lower extremity pain.  He does complain of difficulty with his vision, but his girlfriend states he has a hx of glaucoma and follows with an ophthalmologist for this.   Objective: Temp:  [97.5 F (36.4 C)-98.5 F (36.9 C)] 98 F (36.7 C) (09/28 0600) Pulse Rate:  [88-110] 93 (09/28 0600) Resp:  [11-25] 18 (09/28 0600) BP: (129-212)/(77-141) 148/99 (09/28 0600) SpO2:  [95 %-100 %] 98 % (09/28 0600) Weight:  [163 lb 4.8 oz (74.1 kg)] 163 lb 4.8 oz (74.1 kg) (09/27 1748)  Physical Exam: General: Patient in mild distress. Tearful on exam.  Cardiovascular: Regular rate and rhythm with S1/S2.  Respiratory: CTA bilaterally, no wheezing, rhonchi or rales. Good respiratory effort. Abdomen: Soft, non-tender. BS are present.   Skin: DP pulses intact. Warm and appropriately perfused.  Neuro: Patient awake and alert, able to follow commands and indicate wishes with gestures. GCS:13(Eye 4; verbal 3; motor 6) II-VI, XI and XII intact VII mildly impaired, patient able to raise eyebrows and puff out cheeks, not able to smile. Unable to ases VIII, or IX. Strength: full 5/5 UE and LE in left side. 5/5 strength in right LE, 4/5 in right UE. Sensation intact bilaterally in face, UE and LE. Finger to nose mildly impaired, improved.   Laboratory:  Recent Labs Lab 10/23/16 0906 10/23/16 1005 10/24/16 0700  WBC 5.4  --  6.5  HGB 14.9 16.3 14.2  HCT 46.1 48.0 42.9  PLT 147*  --  138*    Recent Labs Lab 10/23/16 0906 10/23/16 1005 10/24/16 0700  NA 136 138 138  K 4.0 4.6 3.5  CL 102 103 104  CO2 24  --  26  BUN 5* 7 9  CREATININE 1.02 0.90 1.08  CALCIUM 9.2  --  8.9  PROT 7.3  --   --   BILITOT 0.8  --   --   ALKPHOS 75  --   --   ALT 17  --   --   AST 24  --   --   GLUCOSE 189* 196* 130*    Imaging/Diagnostic Tests: Ct Angio Head W Or Wo Contrast  Result Date: 10/23/2016 CLINICAL DATA:  51 year old male code stroke. Altered mental status with waxing and waning abnormal speech and extremity weakness in the  ED. Last known normal last night. Clinically the patient symptoms implicate left cerebral ischemia. EXAM: CT ANGIOGRAPHY HEAD AND NECK TECHNIQUE: Multidetector CT imaging of the head and neck was performed using the standard protocol during bolus administration of intravenous contrast. Multiplanar CT image reconstructions and MIPs were obtained to evaluate the vascular anatomy. Carotid stenosis measurements (when applicable) are obtained utilizing NASCET criteria, using the distal internal carotid diameter as the denominator. CONTRAST:  100 mL Isovue 370, in addition to the contrast volume reported on the CT perfusion study today COMPARISON:  CT head without contrast and CT perfusion from today reported separately. FINDINGS: The initial CTA attempt was noncontrast and therefore nondiagnostic related to IV contrast extravasation. See details on the CT perfusion study today. Repeat CTA was performed at 1113 hours. CTA NECK Skeleton: Intermittent poor dentition. No acute osseous abnormality identified. Chronic right lamina papyracea fracture. Upper chest: Negative visualized lung parenchyma. No superior  mediastinal mass or lymphadenopathy. Other neck: Negative.  No cervical lymphadenopathy. Could contrast timing on the repeat CTA images. Aortic arch: 3 vessel arch configuration with minimal arch atherosclerosis. Right carotid system: Negative right CCA. Soft and calcified plaque at the right ICA origin and bulb but no associated stenosis. Left carotid system: Mild soft and calcified plaque at the left CCA origin without stenosis. Calcified plaque at the left ICA origin without stenosis. Vertebral arteries: No proximal right subclavian artery stenosis, minimal plaque. Normal right vertebral artery origin. The right vertebral is patent to the skullbase without stenosis. No proximal left subclavian artery stenosis, minimal soft plaque. The left vertebral artery is occluded at its origin and throughout the neck. CTA HEAD  Posterior circulation: The distal right vertebral artery is patent but demonstrates moderate to severe atherosclerosis, and up to moderate stenosis in the V4 segment. The vertebrobasilar junction is patent. New line the distal left vertebral artery appears occluded to the level of L bulky calcified plaque seen on series 8, image 149. The distal left V4 segment appears remain patent including the left PICA origin, and is supplied in a retrograde fashion from the vertebrobasilar junction. The basilar artery is patent but highly irregular with moderate to severe multifocal distal basilar stenosis. Both PCA P1 segments are patent, although irregular and worse on the right side. Distal right PCA flow is more robust on the left. It is unclear whether the posterior communicating arteries are present. Anterior circulation: Both ICA siphons are patent but heavily calcified. Hemodynamically significant bilateral anterior genu and proximal supraclinoid segment stenosis is suspected. Both carotid termini are patent. The bilateral ACA A1 segments appear patent but diminutive. The anterior communicating artery and bilateral a 2 segments are patent but irregular. The left MCA M1 segment, bifurcation, and proximal left MCA branches are patent but irregular. There is more severe irregularity of the right MCA M1 segment and right MCA M2 branches, which demonstrate attenuated flow when compared to the left side. Venous sinuses: Patent. Anatomic variants: None. Review of the MIP images confirms the above findings IMPRESSION: 1. Negative for emergent large vessel occlusion. 2. 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 This appears stable since the 2017 brain MRI, and there is retrograde reconstitution of the distal left V4 segment and left PICA. 3. No significant carotid or right vertebral artery stenosis in the neck despite atherosclerosis. 4. 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 There are moderate or severe stenoses of the bilateral ICA siphons, the right vertebral artery V4 segment which supplies the basilar, the basilar artery, and the right MCA M1 and right PCA P1 segments. These findings have progressed since the 2017 MRA. 5. Attenuated flow in the right MCA and right PCA branches, but better maintained flow in the bilateral ACA, left MCA and left PCA branches. 6. This study was reviewed in person with Dr. Erlinda Hong on 10/23/2016 at 1140 hours. Electronically Signed   By: Genevie Ann M.D.   On: 10/23/2016 11:47   Ct Angio Neck W And/or Wo Contrast  Result Date: 10/23/2016 CLINICAL DATA:  50 year old male code stroke. Altered mental status with waxing and waning abnormal speech and extremity weakness in the ED. Last known normal last night. Clinically the patient symptoms implicate left cerebral ischemia. EXAM: CT ANGIOGRAPHY HEAD AND NECK TECHNIQUE: Multidetector CT imaging of the head and neck was performed using the standard protocol during bolus  administration of intravenous contrast. Multiplanar CT image reconstructions and MIPs were obtained to evaluate the vascular anatomy. Carotid stenosis measurements (when applicable) are obtained utilizing NASCET criteria, using the distal internal carotid diameter as the denominator. CONTRAST:  100 mL Isovue 370, in addition to the contrast volume reported on the CT perfusion study today COMPARISON:  CT head without contrast and CT perfusion from today reported separately. FINDINGS: The initial CTA attempt was noncontrast and therefore nondiagnostic related to IV contrast extravasation. See details on the CT perfusion study today. Repeat CTA was performed at 1113 hours. CTA NECK Skeleton: Intermittent poor dentition. No acute osseous abnormality identified. Chronic right lamina papyracea fracture. Upper chest: Negative visualized lung parenchyma. No superior  mediastinal mass or lymphadenopathy. Other neck: Negative.  No cervical lymphadenopathy. Could contrast timing on the repeat CTA images. Aortic arch: 3 vessel arch configuration with minimal arch atherosclerosis. Right carotid system: Negative right CCA. Soft and calcified plaque at the right ICA origin and bulb but no associated stenosis. Left carotid system: Mild soft and calcified plaque at the left CCA origin without stenosis. Calcified plaque at the left ICA origin without stenosis. Vertebral arteries: No proximal right subclavian artery stenosis, minimal plaque. Normal right vertebral artery origin. The right vertebral is patent to the skullbase without stenosis. No proximal left subclavian artery stenosis, minimal soft plaque. The left vertebral artery is occluded at its origin and throughout the neck. CTA HEAD Posterior circulation: The distal right vertebral artery is patent but demonstrates moderate to severe atherosclerosis, and up to moderate stenosis in the V4 segment. The vertebrobasilar junction is patent. New line the distal left vertebral artery appears occluded to the level of L bulky calcified plaque seen on series 8, image 149. The distal left V4 segment appears remain patent including the left PICA origin, and is supplied in a retrograde fashion from the vertebrobasilar junction. The basilar artery is patent but highly irregular with moderate to severe multifocal distal basilar stenosis. Both PCA P1 segments are patent, although irregular and worse on the right side. Distal right PCA flow is more robust on the left. It is unclear whether the posterior communicating arteries are present. Anterior circulation: Both ICA siphons are patent but heavily calcified. Hemodynamically significant bilateral anterior genu and proximal supraclinoid segment stenosis is suspected. Both carotid termini are patent. The bilateral ACA A1 segments appear patent but diminutive. The anterior communicating artery and  bilateral a 2 segments are patent but irregular. The left MCA M1 segment, bifurcation, and proximal left MCA branches are patent but irregular. There is more severe irregularity of the right MCA M1 segment and right MCA M2 branches, which demonstrate attenuated flow when compared to the left side. Venous sinuses: Patent. Anatomic variants: None. Review of the MIP images confirms the above findings IMPRESSION: 1. Negative for emergent large vessel occlusion. 2. 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 This appears stable since the 2017 brain MRI, and there is retrograde reconstitution of the distal left V4 segment and left PICA. 3. No significant carotid or right vertebral artery stenosis in the neck despite atherosclerosis. 4. 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 There are moderate or severe stenoses of the bilateral ICA siphons, the right vertebral artery V4 segment which supplies the basilar, the basilar artery, and the right MCA M1 and right PCA P1 segments. These findings have progressed since the 2017 MRA. 5. Attenuated flow  in the right MCA and right PCA branches, but better maintained flow in the bilateral ACA, left MCA and left PCA branches. 6. This study was reviewed in person with Dr. Erlinda Hong on 10/23/2016 at 1140 hours. Electronically Signed   By: Genevie Ann M.D.   On: 10/23/2016 11:47   Mr Brain Wo Contrast  Result Date: 10/23/2016 CLINICAL DATA:  Follow-up stroke. Known LEFT vertebral artery occlusion. EXAM: MRI HEAD WITHOUT CONTRAST TECHNIQUE: Axial and coronal diffusion weighted imaging, axial T2 FLAIR. Patient could not tolerate further imaging in the examination was terminated. COMPARISON:  CT HEAD October 23, 2016 at 1008 hours. FINDINGS: Severely motion degraded axial T2 FLAIR. BRAIN: 3 mm focus reduced diffusion central pons, not localized on ADC map. Punctate  irregular likely artifact RIGHT inferior temporal lobe. Patchy reduced diffusion LEFT occipital lobe with low ADC values, punctate reduced diffusion RIGHT occipital lobe. 18 x 7 mm reduced diffusion LEFT basal ganglia with low ADC values, punctate reduced diffusion RIGHT basal ganglia. Subcentimeter reduced diffusion LEFT frontal lobe and RIGHT mesial frontal lobe. 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 Patchy supratentorial white matter FLAIR T2 hyperintensities exclusive of the aforementioned abnormality. No hydrocephalus. No midline shift or mass effect. No abnormal extra-axial fluid collections. VASCULAR: Not assessed SKULL AND UPPER CERVICAL SPINE: Not assessed SINUSES/ORBITS: Not assessed OTHER: Not applicable IMPRESSION: 1. Limited 3 sequence motion degraded MRI of the head. 2. Numerous acute supra- and infratentorial infarcts, largest measuring 18 x 7 mm LEFT basal ganglia. Distribution suggests embolic etiology. 3. Moderate chronic small vessel ischemic disease better characterized on prior MRI. 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 Electronically Signed   By: Elon Alas M.D.   On: 10/23/2016 19:19   Ct Cerebral Perfusion W Contrast  Result Date: 10/23/2016 CLINICAL DATA:  51 year old male code stroke. Altered mental status and flaccid right arm. Last seen normal last night. EXAM: CT PERFUSION BRAIN TECHNIQUE: Multiphase CT imaging of the brain was performed following IV bolus contrast injection. Subsequent parametric perfusion maps were calculated using RAPID software. CONTRAST:  90 mL Isovue 370 IV contrast extravasation was observed in the CT suite during the CTA. Extravasated contrast volume estimated at 50 mL. Radiologist Dr. Janeece Fitting was consulted and examined the patient who was then returned to the ED for continued care. COMPARISON:  Head CT without contrast 1008 hours today. Brain MRI and intracranial MRA 03/21/2015 FINDINGS: CT Brain Perfusion Findings: CBF (<30%) Volume:  0 mL Perfusion (Tmax>6.0s)  volume: 4 mL, inferior left temporal gyrus region. Where as less stringent T-max values begin to implicate contralateral right corona radiata tissue. Mismatch Volume:  4 mL Infarction Location:Not applicable CTA CTA head and neck was also attempted at this time, but IV extravasation occurred, such that the attempted CTA images are noncontrast. I discussed this by telephone with Dr. Erlinda Hong On 10/23/2016 at 1045 hours, and he advises a new IV placement and repeat CTA is planned at this time. IMPRESSION: 1. CT perfusion does NOT detect core infarct. And CTP suggests what are probably artifactual hypoperfusion areas in both hemispheres. 2. Initial CTA was noncontrast and nondiagnostic due to 50 mL IV extravasation (see contrast section above). 3. I discussed the above by telephone with Dr. Erlinda Hong On 10/23/2016 at 1045 hours, and he advises a new IV placement and repeat CTA is planned at this time. Electronically Signed   By: Genevie Ann M.D.   On: 10/23/2016 10:50   Ct Head Code Stroke Wo Contrast`  Result Date: 10/23/2016 CLINICAL DATA:  Code stroke. 51 year old male  with altered mental status, right arm flaccid. Last known normal last night. EXAM: CT HEAD WITHOUT CONTRAST TECHNIQUE: Contiguous axial images were obtained from the base of the skull through the vertex without intravenous contrast. COMPARISON:  Brain MRI, intracranial MRA and head CT 03/21/2015 FINDINGS: Brain: Chronic deep gray matter, left cerebellar, and scattered white matter lacunar infarcts as demonstrated on the 2017 MRI. Expected Evolution of the 2017 right corona radiata infarct. Gray-white matter differentiation elsewhere appears stable. No cortically based acute infarct identified. No acute intracranial hemorrhage identified. No midline shift, mass effect, or evidence of intracranial mass lesion. No ventriculomegaly. Vascular: Calcified atherosclerosis at the skull base. No suspicious intracranial vascular hyperdensity. Skull: Chronic right lamina  papyracea fracture. No acute osseous abnormality identified. Sinuses/Orbits: Chronic ethmoid opacification. Chronic right frontoethmoidal recess opacification. Stable paranasal sinuses. Mastoids and tympanic cavities remain clear. Other: Mild leftward gaze deviation. No acute orbit or scalp soft tissue findings. ASPECTS [facility] Stroke Program Early CT Score) - Ganglionic level infarction (caudate, lentiform nuclei, internal capsule, insula, M1-M3 cortex): 7 - Supraganglionic infarction (M4-M6 cortex): 3 Total score (0-10 with 10 being normal): 10 IMPRESSION: 1. 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 No acute cortically based infarct or acute intracranial hemorrhage identified. 2. ASPECTS is 10. 3. The above was relayed via text pager to Dr. Erlinda Hong On 10/23/2016 at 10:16 . Electronically Signed   By: Genevie Ann M.D.   On: 10/23/2016 10:16    Alisa Graff, Medical Student 10/24/2016, 9:31 AM [place] Intern pager: [phone], text pages welcome  ________________________________________________________________________ I agree with the medical student's note above, with notable exceptions in the assessment and plan which I have given below.   General: NAD. Still has expressive aphasia but slightly improved  Eyes: Pupils equal, round and reactive to light.  Cardiovascular: RRR with S1/S2.  Respiratory: CTA bilaterally. No wheezing or rales.  Gastrointestinal: Soft, non-tender, BS present.  DEY:CXKGYJE neglecting right side of body. Unable to move right side. Full movement of left upper and lower extremity.  Derm: Warm, dry  Neuro:awake and alert, expressive aphasia. Normal sensory function of trigeminal nerve. Still has right facial droop noticed with smiling. Right upper extremity 4/5 strength. Left upper extremity 5/5 strength. RLE 4-5/5 strength. LLE 5/5. Normal sensation to light touch. Left sided finger to nose is much improved. Still unable  to do finger to nose on the right.   Assessment and Plan: Eric Morton a 51 y.o.malepresenting with right sided weakness and aphasia found to have acute infarcts possibly embolic in etiology. Nero exam much improved from yesterday. PMH is significant for CKD, HTN, HLD, polysubstance abuse, polycythemia vera, DM2. Patient was given 81Mg  of aspirin in ED and started on 0.9% NaCl infusion.   Acute stroke: Supra- and Infratentorial Infarcts: MRI brain with numerous acute supra- and infratentorial infarcts, largest measuring 18 x 7 mm LEFT basal ganglia. Distribution suggests embolic etiology. - neurology consulted appreciate recommendations  - neuro checks per protocol - ASA 325 mg daily and Plavix 75 mg daily per Neuro - NG tube out overnight; failed attempts to replace. Consulted cortrak for placement vs SLP evaluation first  - Atorvastatin  - ECHO - Lower extremity dopplers ordered per neuro  - PT/OT/ SLP  HLD: Patient not compliant with home medications. Lipid panel ordered. Abnormal triglycerides 161. All other values WNL.  -  Lipitor 40mg    HTN, stable: Patient not compliant with home medication, Lisinopril 20mg  daily. BP has  improved since admission and stable. Will likely restart Lisinopril in the next day or two   DM: A1c (9/27) 8.9 (from 11.2 in 06/2016). Not compliant with home meds (metformin and glipizide)  - Sensitive SSI with q4 hr cbgs while NPO  ETOH abuse: Patient's girlfriend reports his use of beer a day. History of withdrawals in past but denies history of seizure. Serum alcohol ,10mg /dL. Last drink 9/26.  - CIWA protocol  - Thiamine, folate  Polycythemia  Last phlebotomy on 8/28. Hemat today 46, goal is <45.  - monitor  Neuropathy:  Patient is taking gabapentin 300mg  BID at home - will hold for now, add back when able   FEN/GI: NPO until evaluated by SLP; NS @100cc /hr Prophylaxis:   Disposition:pending further stroke work up   Onnie Boer, MD Family Medicine, PGY 3 10/24/2016 9:53 AM

## 2016-10-24 NOTE — Evaluation (Signed)
Clinical/Bedside Swallow Evaluation Patient Details  Name: Eric Morton MRN: [id number] Date of Birth: [birth year]  Today's Date: 10/24/2016 Time: SLP Start Time (ACUTE ONLY): 0900 SLP Stop Time (ACUTE ONLY): 0923 SLP Time Calculation (min) (ACUTE ONLY): 23 min  Past Medical History:  Past Medical History:  Diagnosis Date  . 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 Anxiety   . Chronic kidney disease    "jsut a little damage"  . Glaucoma   . History of stomach ulcers    not confirmed, never underwent diagnostic endo or radiology, this manifested as epigastric distress.   [name] Hx of adenomatous colonic polyps   . Hypercholesterolemia   . Hypertension   . Peripheral neuropathy   . Polycythemia vera ([place]) 2006  . Substance abuse   . Type II diabetes mellitus ([place])    Past Surgical History:  Past Surgical History:  Procedure Laterality Date  . COLONOSCOPY WITH PROPOFOL N/A 04/28/2014   Procedure: COLONOSCOPY WITH PROPOFOL;  Surgeon: Gatha Mayer, MD;  Location: [place];  Service: Endoscopy;  Laterality: N/A;  . IRRIGATION AND DEBRIDEMENT ABSCESS  09/26/2011   Procedure: IRRIGATION AND DEBRIDEMENT ABSCESS;  Surgeon: Imogene Burn. Georgette Dover, MD;  Location: [place];  Service: General;  Laterality: Left;  Incision and Drainage left breast abscess   HPI:  JMARI PELC a 51 y.o.malepresenting with right sided weakness and aphasia. PMH is significant for CKD, HTN, HLD, polysubstance abuse, polycythemia vera, DM2. MRI Numerous acute supra- and infratentorial infarcts, largest measuring 18 x 7 mm LEFT basal ganglia. Distribution suggests embolic etiology. Moderate chronic small vessel ischemic disease better characterized on prior MRI. 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 BSE 02/2015 recommended regular/thin. Family denies prior dysphagia with old strokes.    Assessment / Plan / Recommendation Clinical Impression  Pt intermittently crying/anxious, provided calming techniques. Oral  deficits include poor cohesion and delayed transit. Suspect decreased cohesion/control and dysfunctional protective mechanisms leading to immediate cough with thin and questionable wet vocal quality. Speech is significantly dysarthric with hypernasal quality. Pt consumed pill whole in applesauce without overt difficulty. Objective assessment with MBS scheduled for 11:30 today.  SLP Visit Diagnosis: Dysphagia, oropharyngeal phase (R13.12)    Aspiration Risk  Severe aspiration risk    Diet Recommendation NPO        Other  Recommendations Oral Care Recommendations: Oral care QID   Follow up Recommendations        Frequency and Duration            Prognosis        Swallow Study   General HPI: CURTISS MAHMOOD a 51 y.o.malepresenting with right sided weakness and aphasia. PMH is significant for CKD, HTN, HLD, polysubstance abuse, polycythemia vera, DM2. MRI Numerous acute supra- and infratentorial infarcts, largest measuring 18 x 7 mm LEFT basal ganglia. Distribution suggests embolic etiology. Moderate chronic small vessel ischemic disease better characterized on prior MRI. 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 BSE 02/2015 recommended regular/thin. Family denies prior dysphagia with old strokes.  Type of Study: Bedside Swallow Evaluation Previous Swallow Assessment:  (se HPI) Diet Prior to this Study: NPO Temperature Spikes Noted: No Respiratory Status: Room air History of Recent Intubation: No Behavior/Cognition: Alert;Requires cueing (crying intermittently) Oral Care Completed by SLP: No (due to pt's anxiety at present) Oral Cavity - Dentition: Poor condition;Missing dentition Vision: Functional for self-feeding Self-Feeding Abilities: Able to feed self;Needs assist;Needs set up Patient Positioning: Upright in bed Baseline Vocal Quality:  (hypernasal) Volitional Cough:  Strong Volitional Swallow: Able to elicit    Oral/Motor/Sensory Function Overall Oral Motor/Sensory Function: Mild  impairment Facial ROM: Reduced right;Suspected CN VII (facial) dysfunction Facial Symmetry: Abnormal symmetry right;Suspected CN VII (facial) dysfunction Facial Strength: Reduced right;Suspected CN VII (facial) dysfunction   Ice Chips Ice chips: Not tested   Thin Liquid Thin Liquid: Impaired Presentation: Cup Oral Phase Impairments: Reduced labial seal Oral Phase Functional Implications: Right anterior spillage;Oral holding Pharyngeal  Phase Impairments: Cough - Immediate    Nectar Thick Nectar Thick Liquid: Not tested   Honey Thick Honey Thick Liquid: Not tested   Puree Puree: Impaired Oral Phase Impairments:  (labial residue) Oral Phase Functional Implications: Prolonged oral transit Pharyngeal Phase Impairments:  (questionable wet vocal quality)   Solid   GO   Solid: Not tested        Houston Siren 10/24/2016,9:45 AM  Orbie Pyo Colvin Caroli.[employer] [phone]

## 2016-10-24 NOTE — Evaluation (Signed)
Occupational Therapy Evaluation Patient Details Name: Eric Morton MRN: [id number] DOB: 02-11-1965 Today's Date: 10/24/2016    History of Present Illness Eric Morton is a 51 y.o. male presenting with right sided weakness and aphasia. PMH is significant for CKD, HTN, HLD, polysubstance abuse, polycythemia vera, DM2. MRI Numerous acute supra- and infratentorial infarcts, largest measuring 18 x 7 mm LEFT basal ganglia. Distribution suggests embolic etiology. 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    Clinical Impression   This 51 yo male admitted with above presents to acute OT with impulsivity, decreased strength and coordination of RUE/RLE, decreased sitting and standing balance all affecting his ability to do his basic ADLs at his PLOF of totally independent. He will benefit from acute OT with follow up OT on CIR to get to a min A level or better to return home with family.    Follow Up Recommendations  CIR;Supervision/Assistance - 24 hour    Equipment Recommendations  Other (comment) (TBD at next venue)    Recommendations for Other Services Rehab consult     Precautions / Restrictions Precautions Precautions: Fall Restrictions Weight Bearing Restrictions: No      Mobility Bed Mobility Overal bed mobility: Needs Assistance Bed Mobility: Supine to Sit     Supine to sit: Min guard;HOB elevated     General bed mobility comments: increased time  Transfers Overall transfer level: Needs assistance Equipment used:  (standing in front of pt with gait belt and pt holding onto my arms) Transfers: Sit to/from [facility] Sit to Stand: Mod assist Stand pivot transfers: Max assist       General transfer comment: for stand pivot ( blocking right knee, A'ing with weight shifting to move RLE and A to move RLE    Balance Overall balance assessment: Needs assistance Sitting-balance support: Feet supported;Single extremity supported   Sitting balance - Comments: poor  dynamically, fair statically   Standing balance support: Bilateral upper extremity supported Standing balance-Leahy Scale: Poor Standing balance comment: reliant on BIL UE support and support of therapist                           ADL either performed or assessed with clinical judgement   ADL Overall ADL's : Needs assistance/impaired Eating/Feeding: Set up;Supervision/ safety Eating/Feeding Details (indicate cue type and reason): full S for meals due to impulsivity per SLP and pocketing Grooming: Moderate assistance;Sitting   Upper Body Bathing: Moderate assistance;Sitting   Lower Body Bathing: Maximal assistance Lower Body Bathing Details (indicate cue type and reason): MOd A sit<>stand, Max A to maintain standing Upper Body Dressing : Moderate assistance;Sitting   Lower Body Dressing: Moderate assistance Lower Body Dressing Details (indicate cue type and reason): MOd A sit<>stand, Max A to maintain standing Toilet Transfer: Maximal assistance;Stand-pivot Toilet Transfer Details (indicate cue type and reason): blocking right knee Toileting- Clothing Manipulation and Hygiene: Maximal assistance Toileting - Clothing Manipulation Details (indicate cue type and reason): MOd A sit<>stand, Max A to maintain standing             Vision   Additional Comments: Pt having hard time keeping eyes open for testing--need to further assess            Pertinent Vitals/Pain Pain Assessment: No/denies pain     Hand Dominance Right   Extremity/Trunk Assessment Upper Extremity Assessment Upper Extremity Assessment: RUE deficits/detail RUE Deficits / Details: able to move whole arm with increased time and effort (both FM and  GM involvement) RUE Coordination: decreased fine motor;decreased gross motor           Communication Communication Communication: Expressive difficulties   Cognition Arousal/Alertness: Awake/alert Behavior During Therapy: Impulsive Overall  Cognitive Status: Impaired/Different from baseline Area of Impairment: Safety/judgement;Problem solving                         Safety/Judgement: Decreased awareness of safety;Decreased awareness of deficits   Problem Solving: Requires verbal cues                Home Living Family/patient expects to be discharged to:: Private residence Living Arrangements: Other relatives Available Help at Discharge: Family;Available 24 hours/day Type of Home: Apartment Home Access: Level entry     Home Layout: One level     Bathroom Shower/Tub: Teacher, early years/pre: Standard     Home Equipment: None          Prior Functioning/Environment Level of Independence: Independent                 OT Problem List: Decreased strength;Decreased range of motion;Impaired balance (sitting and/or standing);Decreased coordination;Decreased safety awareness;Impaired tone;Impaired UE functional use;Decreased knowledge of use of DME or AE      OT Treatment/Interventions: Self-care/ADL training;Therapeutic activities;Patient/family education;DME and/or AE instruction;Balance training    OT Goals(Current goals can be found in the care plan section) Acute Rehab OT Goals Patient Stated Goal: to get up  OT Goal Formulation: With patient Time For Goal Achievement: 11/07/16 Potential to Achieve Goals: Good  OT Frequency: Min 3X/week              AM-PAC PT "6 Clicks" Daily Activity     Outcome Measure Help from another person eating meals?: A Little Help from another person taking care of personal grooming?: A Lot Help from another person toileting, which includes using toliet, bedpan, or urinal?: A Lot Help from another person bathing (including washing, rinsing, drying)?: A Lot Help from another person to put on and taking off regular upper body clothing?: A Lot Help from another person to put on and taking off regular lower body clothing?: A Lot 6 Click Score: 13    End of Session Equipment Utilized During Treatment: Gait belt Nurse Communication: Mobility status (2 person going back to pt's strong side (R knee will partially buckle) or stedy)  Activity Tolerance: Patient tolerated treatment well Patient left: in chair;with call bell/phone within reach;with chair alarm set  OT Visit Diagnosis: Unsteadiness on feet (R26.81);Other abnormalities of gait and mobility (R26.89);Muscle weakness (generalized) (M62.81);Cognitive communication deficit (R41.841);Hemiplegia and hemiparesis Symptoms and signs involving cognitive functions: Cerebral infarction Hemiplegia - Right/Left: Right Hemiplegia - dominant/non-dominant: Dominant Hemiplegia - caused by: Cerebral infarction                Time: 1225-1253 OT Time Calculation (min): 28 min Charges:  OT General Charges $OT Visit: 1 Visit OT Evaluation $OT Eval Moderate Complexity: 1 Mod OT Treatments $Self Care/Home Management : 8-22 mins Golden Circle, OTR/L [phone] 10/24/2016

## 2016-10-24 NOTE — Progress Notes (Signed)
Rehab Admissions Coordinator Note:  Patient was screened by Retta Diones for appropriateness for an Inpatient Acute Rehab Consult.  Noted SLP recommending CIR.  Will await PT/OT evaluations to determine functional needs.  May need inpatient rehab consult.  Jodell Cipro M 10/24/2016, 1:10 PM  I can be reached at [phone].

## 2016-10-24 NOTE — Progress Notes (Signed)
NG tube placement attempted x 2. Pt unable to stay still, pulls tube out. MD notified.

## 2016-10-24 NOTE — Progress Notes (Signed)
PT Cancellation Note  Patient Details Name: Eric Morton MRN: [id number] DOB: 17-Feb-1965   Cancelled Treatment:    Reason Eval/Treat Not Completed: Patient at procedure or test/unavailable Pt currently at swallow test. Will reattempt as schedule allows.   Leighton Ruff, PT, DPT  Acute Rehabilitation Services  Pager: [phone]    Rudean Hitt 10/24/2016, 11:12 AM

## 2016-10-24 NOTE — Progress Notes (Signed)
Cortrak Tube Team Note:  Consult received to place a Cortrak feeding tube.   As was preparing to place tube, ST offered to trial MBS prior to placement. ST communicated after that NGT not warranted at this time.  If swallow worsens or s/s aspiration noted and NGT becomes warranted, service will be active tomorrow as well.  Note Cortrak service is not active Sunday and NGT would need to be placed by RN or IR  if needed that day.   Burtis Junes RD, LDN, CNSC Clinical Nutrition Pager: [phone] 10/24/2016 11:41 AM

## 2016-10-25 ENCOUNTER — Inpatient Hospital Stay (HOSPITAL_COMMUNITY): Payer: Medicaid Other

## 2016-10-25 LAB — ECHOCARDIOGRAM COMPLETE
AVLVOTPG: 4 mmHg
E/e' ratio: 5.32
FS: 48 % — AB (ref 28–44)
HEIGHTINCHES: 67 in
IV/PV OW: 0.91
LA vol: 41 mL
LADIAMINDEX: 2.04 cm/m2
LASIZE: 38 mm
LAVOLA4C: 37 mL
LAVOLIN: 22 mL/m2
LEFT ATRIUM END SYS DIAM: 38 mm
LV E/e' medial: 5.32
LV TDI E'LATERAL: 8.44
LV TDI E'MEDIAL: 6.25
LV e' LATERAL: 8.44 cm/s
LVEEAVG: 5.32
LVOT SV: 72 mL
LVOT VTI: 20.7 cm
LVOT area: 3.46 cm2
LVOT peak vel: 95.8 cm/s
LVOTD: 21 mm
Lateral S' vel: 10.2 cm/s
MV pk E vel: 44.9 m/s
MVPKAVEL: 76.6 m/s
PW: 11 mm — AB (ref 0.6–1.1)
TAPSE: 16.6 mm
Weight: 2612.8 oz

## 2016-10-25 LAB — GLUCOSE, CAPILLARY
GLUCOSE-CAPILLARY: 116 mg/dL — AB (ref 65–99)
GLUCOSE-CAPILLARY: 95 mg/dL (ref 65–99)
Glucose-Capillary: 137 mg/dL — ABNORMAL HIGH (ref 65–99)
Glucose-Capillary: 123 mg/dL — ABNORMAL HIGH (ref 65–99)
Glucose-Capillary: 159 mg/dL — ABNORMAL HIGH (ref 65–99)
Glucose-Capillary: 152 mg/dL — ABNORMAL HIGH (ref 65–99)

## 2016-10-25 LAB — CBC
HEMATOCRIT: 42.7 % (ref 39.0–52.0)
Hemoglobin: 13.8 g/dL (ref 13.0–17.0)
MCH: 28.1 pg (ref 26.0–34.0)
MCHC: 32.3 g/dL (ref 30.0–36.0)
MCV: 87 fL (ref 78.0–100.0)
Platelets: 135 10*3/uL — ABNORMAL LOW (ref 150–400)
RBC: 4.91 MIL/uL (ref 4.22–5.81)
RDW: 13.3 % (ref 11.5–15.5)
WBC: 5.3 10*3/uL (ref 4.0–10.5)

## 2016-10-25 LAB — BASIC METABOLIC PANEL
Anion gap: 8 (ref 5–15)
BUN: 10 mg/dL (ref 6–20)
CHLORIDE: 106 mmol/L (ref 101–111)
CO2: 24 mmol/L (ref 22–32)
Calcium: 8.5 mg/dL — ABNORMAL LOW (ref 8.9–10.3)
Creatinine, Ser: 1.03 mg/dL (ref 0.61–1.24)
GFR calc Af Amer: >60 mL/min (ref >60)
GFR calc non Af Amer: >60 mL/min (ref >60)
GLUCOSE: 129 mg/dL — AB (ref 65–99)
POTASSIUM: 3.3 mmol/L — AB (ref 3.5–5.1)
Sodium: 138 mmol/L (ref 135–145)

## 2016-10-25 MED ORDER — LISINOPRIL 20 MG PO TABS: 20.0000 mg | ORAL_TABLET | Freq: Every day | ORAL | Status: DC

## 2016-10-25 MED ORDER — LISINOPRIL 5 MG PO TABS
5.0000 mg | ORAL_TABLET | Freq: Every day | ORAL | Status: DC
  Administered 2016-10-25 – 2016-10-27 (×3): 5 mg via ORAL
  Filled 2016-10-25 (×3): qty 1

## 2016-10-25 NOTE — Progress Notes (Signed)
Provided communication board for pt/ demonstrated use.   Blair Heys, MA, CCC-SLP

## 2016-10-25 NOTE — Progress Notes (Signed)
  Speech Language Pathology Treatment: Dysphagia  Patient Details Name: Eric Morton MRN: [id number] DOB: August 20, 1965 Today's Date: 10/25/2016 Time: 1010-1040 SLP Time Calculation (min) (ACUTE ONLY): 30 min  Assessment / Plan / Recommendation Clinical Impression  Dysphagia treatment provided for diet tolerance. Pt showed no overt s/s of aspiration on consistencies tested- honey-thick liquids by tsp and puree consistency as recommended from MBS previous date. RN reports pt overall has been doing okay but had one instance of coughing after having medications crushed in applesauce after being reclined back; suspect this may have been a result of pharyngeal residuals. Recommend continuing current diet with full supervision for compensatory strategies- swallow 2x, small bites/ sips, check for pocketing. Will continue to follow for diet tolerance/ consider repeat MBS as pt progresses.    HPI HPI: Pt is a 51 y.o. male presented to ED 9/27 with R side weakness/ aphasia/ dysarthria. PMH is significant for CKD, HTN, HLD, polysubstance abuse, polycythemia vera, DM2. MRI showed Numerous acute supra- and infratentorial infarcts, largest measuring 18 x 7 mm LEFT basal ganglia. Distribution suggests embolic etiology; old L cerebellar infarcts. MBS previous date- recommended dysphagia 1/ honey thick liquids by tsp only. Cognitive-linguistic evaluation ordered.       SLP Plan  Continue with current plan of care  Patient needs continued Speech Lanaguage Pathology Services    Recommendations  Diet recommendations: Dysphagia 1 (puree);Honey-thick liquid Liquids provided via: Teaspoon Medication Administration: Crushed with puree Supervision: Staff to assist with self feeding;Full supervision/cueing for compensatory strategies Compensations: Minimize environmental distractions;Slow rate;Small sips/bites;Lingual sweep for clearance of pocketing;Multiple dry swallows after each bite/sip Postural Changes and/or  Swallow Maneuvers: Seated upright 90 degrees                Oral Care Recommendations: Oral care BID Follow up Recommendations: Inpatient Rehab SLP Visit Diagnosis: Dysphagia, oropharyngeal phase (R13.12) Plan: Continue with current plan of care       [place], [place], [place] 10/25/2016, 10:58 AM  [phone]

## 2016-10-25 NOTE — Progress Notes (Signed)
Family Medicine Teaching Service Daily Progress Note Intern Pager: [phone]  Patient name: Eric Morton Medical record number: [id number] Date of birth: 1965/06/07 Age: 51 y.o. Gender: male  Primary Care Provider: Arnoldo Morale, MD Consultants: Neuro (signed off) Code Status: FULL  Pt Overview and Major Events to Date:  09/27 Admit to [facility] for weakness and aphasia; NG tube placed 09/28 NG tube removed; transition to dysphagia 1 diet  Assessment and Plan: Eric Morton is a 51 y.o. male presenting with right sided weakness and aphasia. PMH is significant for CKD, HTN, HLD, polysubstance abuse, polycythemia vera, DM2.    Right sided weakness/aphasia:  Hx of CVA. At baseline has right sided weakness and slurred speech, but markedly worsened per girlfriend. Patient is able to grunt to answer questions. CT head with no acute stroke but advanced small vessel disease. MRI head with mutifocal bilateral infarcts largest at L BG, suggestive of embolic etiology. UDS positive for cocaine which also may have contributed. CTA head/neck with widespread severe intracranial vascular disease and atherosclerosis. LE Doppler neg. Pt feels he is getting stronger. - 30d cardiac event montiro on discharge to rule out a.fib per neuro recs - Continue ASA 325mg  and Plavix for 3 months, followed by Plavix alone - Echo performed - pending read - Begin rosuvastatin as patient reporting atorvastatin allergy - Resume home lisinopril at decreased dose of 5mg  qd - Neuro checks q4 - PT/OT/SLP following - recommend CIR  HLD: Patient not compliant with home medications. Abnormal triglycerides 161 on lipid panel. All other values WNL. Reports allergy to atorvastatin.  - Begin rosuvastatin. If signs of allergic reaction, can try different statin at moderate intensity.   HTN: Patient not compliant with home medication, Lisinopril 20mg  daily. BP 212/141 on admission; 178/86 this AM. - Resume home lisinopril at decreased  dose of 5mg  qd - Monitor BP closely as will need to normalize slowly over next few days. Increase lisinopril dose gradually back to home 20mg  prior to discharge.  - IV hydral PRN BP >220/120  DM: A1c (9/27) 8.9. CBG 123 this AM.  - Sensitive SSI  - CBG qACHS   ETOH abuse: Patient notes history of withdrawals in past but denies history of seizure. Serum alcohol ,10mg /dL. Last drink 9/26. CIWA scores 10, 3 overnight. Received IV Ativan 1 mg x2 yesterday   - CIWA protocol  - Thiamine, folate  Polycythemia  Hct now at goal at 42.7 this AM, goal is <45.  - Daily CBC  Neuropathy:  - Holding home gabapentin 300mg  BID   Possible OSA: - Outpatient sleep study recommended per neuro  FEN/GI: Dysphagia 1 diet PPx: SCDs  Disposition: CIR pending medical improvement   Subjective:  Echo being performed when I entered room. Patient able to make sounds to answer yes or no questions, but still unable to form words. Denies pain today. Says he thinks he is a little bit stronger this morning (motioned with hands).   Objective: Temp:  [97.9 F (36.6 C)-98.4 F (36.9 C)] 98.2 F (36.8 C) (09/29 0609) Pulse Rate:  [82-90] 82 (09/29 0609) Resp:  [18-20] 20 (09/29 0609) BP: (160-181)/(86-88) 178/86 (09/29 0609) SpO2:  [100 %] 100 % (09/29 4709)  Physical Exam: General: Lying in bed in NAD; echo tech at bedside Cardiovascular: RRR, no murmurs appreciated  Respiratory: CTAB, normal WOB on RA, no wheezes Abdomen: Soft, NTND, +BS Skin: Warm and dry Neuro: Awake and alert, able to follow commands and indicate wishes with gestures. 5/5 strength LUE  and LLE. 5/5 strength in RLE, 4/5 in RUE.   Laboratory:  Recent Labs Lab 10/23/16 0906 10/23/16 1005 10/24/16 0700 10/25/16 0428  WBC 5.4  --  6.5 5.3  HGB 14.9 16.3 14.2 13.8  HCT 46.1 48.0 42.9 42.7  PLT 147*  --  138* 135*    Recent Labs Lab 10/23/16 0906 10/23/16 1005 10/24/16 0700 10/25/16 0428  NA 136 138 138 138  K 4.0 4.6  3.5 3.3*  CL 102 103 104 106  CO2 24  --  26 24  BUN 5* 7 9 10   CREATININE 1.02 0.90 1.08 1.03  CALCIUM 9.2  --  8.9 8.5*  PROT 7.3  --   --   --   BILITOT 0.8  --   --   --   ALKPHOS 75  --   --   --   ALT 17  --   --   --   AST 24  --   --   --   GLUCOSE 189* 196* 130* 129*    Imaging/Diagnostic Tests: Dg Swallowing Func-speech Pathology  Result Date: 10/24/2016 Objective Swallowing Evaluation: Type of Study: MBS-Modified Barium Swallow Study Patient Details Name: Eric Morton MRN: [id number] Date of Birth: [birth year] Today's Date: 10/24/2016 Time: SLP Start Time (ACUTE ONLY): 1103-SLP Stop Time (ACUTE ONLY): 1121 SLP Time Calculation (min) (ACUTE ONLY): 18 min Past Medical History: Past Medical History: Diagnosis Date . Alcohol liver damage (HCC)   "just a little damage" . Anxiety  . Chronic kidney disease   "jsut a little damage" . Glaucoma  . History of stomach ulcers   not confirmed, never underwent diagnostic endo or radiology, this manifested as epigastric distress.  [name] Hx of adenomatous colonic polyps  . Hypercholesterolemia  . Hypertension  . Peripheral neuropathy  . Polycythemia vera ([facility]) 2006 . Substance abuse  . Type II diabetes mellitus ([facility])  Past Surgical History: Past Surgical History: Procedure Laterality Date . COLONOSCOPY WITH PROPOFOL N/A 04/28/2014  Procedure: COLONOSCOPY WITH PROPOFOL;  Surgeon: Eric Mayer, MD;  Location: [facility];  Service: Endoscopy;  Laterality: N/A; . IRRIGATION AND DEBRIDEMENT ABSCESS  09/26/2011  Procedure: IRRIGATION AND DEBRIDEMENT ABSCESS;  Surgeon: Eric Burn. Georgette Dover, MD;  Location: [employer];  Service: General;  Laterality: Left;  Incision and Drainage left breast abscess HPI: Eric Morton a 51 y.o.malepresenting with right sided weakness and aphasia. PMH is significant for CKD, HTN, HLD, polysubstance abuse, polycythemia vera, DM2. MRI Numerous acute supra- and infratentorial infarcts, largest measuring 18 x 7 mm LEFT basal ganglia. Distribution  suggests embolic etiology. Moderate chronic small vessel ischemic disease better characterized on prior MRI. 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 BSE 02/2015 recommended regular/thin. Family denies prior dysphagia with old strokes. MBS recommended following BSE.  No Data Recorded Assessment / Plan / Recommendation CHL IP CLINICAL IMPRESSIONS 10/24/2016 Clinical Impression Pt exhibited significantly decreased lingual cohesion/control leading to anterior spill, anterior sulci residue, holding, piecemeal swallow pattern and lingual residue. Laryngeal penetration before the swallow due to poor oral control reaching vocal cords, silently with nectar. Penetration during multiple swallows with nectar cleared vestibule. Pharyngeal residuals from mod-max given motor weakness. Requires max cues for volitional swallow that reduce quantity. Pt is impulsive and requires full supervisio with po's. Recommend Dys 1, honey via TEASPOON only, crush meds, sit upright, 2 swallows, check oral cavity for pocketing.       SLP Visit Diagnosis Dysphagia, oropharyngeal phase (R13.12) Attention and concentration deficit following -- Frontal lobe and executive  function deficit following -- Impact on safety and function Severe aspiration risk   CHL IP TREATMENT RECOMMENDATION 10/24/2016 Treatment Recommendations Therapy as outlined in treatment plan below   Prognosis 10/24/2016 Prognosis for Safe Diet Advancement Good Barriers to Reach Goals Behavior Barriers/Prognosis Comment -- CHL IP DIET RECOMMENDATION 10/24/2016 SLP Diet Recommendations Dysphagia 1 (Puree) solids;Honey thick liquids Liquid Administration via Spoon Medication Administration Crushed with puree Compensations Slow rate;Minimize environmental distractions;Small sips/bites;Lingual sweep for clearance of pocketing;Monitor for anterior loss;Multiple dry swallows after each bite/sip Postural Changes Seated upright at 90 degrees   CHL IP OTHER RECOMMENDATIONS 10/24/2016 Recommended Consults --  Oral Care Recommendations Oral care BID Other Recommendations --   CHL IP FOLLOW UP RECOMMENDATIONS 10/24/2016 Follow up Recommendations Inpatient Rehab   CHL IP FREQUENCY AND DURATION 10/24/2016 Speech Therapy Frequency (ACUTE ONLY) min 2x/week Treatment Duration 2 weeks      CHL IP ORAL PHASE 10/24/2016 Oral Phase Impaired Oral - Pudding Teaspoon -- Oral - Pudding Cup -- Oral - Honey Teaspoon Delayed oral transit;Weak lingual manipulation;Holding of bolus;Pocketing in anterior sulcus;Lingual/palatal residue Oral - Honey Cup Delayed oral transit;Decreased bolus cohesion;Right anterior bolus loss;Weak lingual manipulation;Holding of bolus;Pocketing in anterior sulcus;Lingual/palatal residue;Piecemeal swallowing Oral - Nectar Teaspoon -- Oral - Nectar Cup Decreased bolus cohesion;Delayed oral transit;Reduced posterior propulsion;Lingual/palatal residue;Pocketing in anterior sulcus;Right anterior bolus loss;Premature spillage Oral - Nectar Straw -- Oral - Thin Teaspoon -- Oral - Thin Cup -- Oral - Thin Straw -- Oral - Puree Delayed oral transit;Weak lingual manipulation;Lingual/palatal residue Oral - Mech Soft -- Oral - Regular -- Oral - Multi-Consistency -- Oral - Pill -- Oral Phase - Comment --  CHL IP PHARYNGEAL PHASE 10/24/2016 Pharyngeal Phase Impaired Pharyngeal- Pudding Teaspoon -- Pharyngeal -- Pharyngeal- Pudding Cup -- Pharyngeal -- Pharyngeal- Honey Teaspoon Penetration/Aspiration during swallow;Pharyngeal residue - valleculae;Pharyngeal residue - pyriform;Reduced epiglottic inversion;Reduced laryngeal elevation Pharyngeal Material enters airway, remains ABOVE vocal cords then ejected out Pharyngeal- Honey Cup Pharyngeal residue - valleculae;Pharyngeal residue - pyriform;Reduced epiglottic inversion;Reduced laryngeal elevation Pharyngeal -- Pharyngeal- Nectar Teaspoon -- Pharyngeal -- Pharyngeal- Nectar Cup Penetration/Aspiration during swallow;Pharyngeal residue - pyriform;Pharyngeal residue -  valleculae;Reduced epiglottic inversion;Reduced laryngeal elevation Pharyngeal Material enters airway, passes BELOW cords then ejected out;Material enters airway, remains ABOVE vocal cords and not ejected out Pharyngeal- Nectar Straw -- Pharyngeal -- Pharyngeal- Thin Teaspoon -- Pharyngeal -- Pharyngeal- Thin Cup -- Pharyngeal -- Pharyngeal- Thin Straw -- Pharyngeal -- Pharyngeal- Puree Pharyngeal residue - valleculae;Reduced tongue base retraction Pharyngeal -- Pharyngeal- Mechanical Soft -- Pharyngeal -- Pharyngeal- Regular -- Pharyngeal -- Pharyngeal- Multi-consistency -- Pharyngeal -- Pharyngeal- Pill -- Pharyngeal -- Pharyngeal Comment --  CHL IP CERVICAL ESOPHAGEAL PHASE 10/24/2016 Cervical Esophageal Phase WFL Pudding Teaspoon -- Pudding Cup -- Honey Teaspoon -- Honey Cup -- Nectar Teaspoon -- Nectar Cup -- Nectar Straw -- Thin Teaspoon -- Thin Cup -- Thin Straw -- Puree -- Mechanical Soft -- Regular -- Multi-consistency -- Pill -- Cervical Esophageal Comment -- No flowsheet data found. Houston Siren 10/24/2016, 12:24 PM Orbie Pyo Colvin Caroli.Ed CCC-SLP Pager [phone]               Verner Mould, MD 10/25/2016, 10:15 AM [facility] Intern pager: [phone], text pages welcome

## 2016-10-25 NOTE — Progress Notes (Signed)
Family Medicine Teaching Service Daily Progress Note Intern Pager: [phone]  Patient name: Eric Morton Medical record number: [id number] Date of birth: 02-May-1965 Age: 51 y.o. Gender: male  Primary Care Provider: Arnoldo Morale, MD Consultants: Neuro (signed off), PM&R Code Status: FULL  Pt Overview and Major Events to Date:  09/27 Admit to [facility] for weakness and aphasia; NG tube placed 09/28 NG tube removed; transition to dysphagia 1 diet  Assessment and Plan: Eric Morton is a 51 y.o. male presenting with right sided weakness and aphasia. PMH is significant for CKD, HTN, HLD, polysubstance abuse, polycythemia vera, DM2.    Right sided weakness/aphasia:  Hx of CVA. At baseline has right sided weakness and slurred speech, but markedly worsened per girlfriend. Patient is able to grunt to answer questions. CT head with no acute stroke but advanced small vessel disease. MRI head with mutifocal bilateral infarcts largest at L BG, suggestive of embolic etiology. UDS positive for cocaine which also may have contributed. CTA head/neck with widespread severe intracranial vascular disease and atherosclerosis. LE Doppler neg. Echo with EF 55-60%, G1DD, and no cardiac source of emboli.  - 30d cardiac event montiro on discharge to rule out a.fib per neuro recs - Continue ASA 325mg  and Plavix for 3 months, followed by Plavix alone - Cont rosuvastatin - Home lisinopril at decreased dose of 10mg  qd. Increase as tolerated.  - Neuro checks q4 - PT/OT/SLP following - recommend CIR  HLD: Patient not compliant with home medications. Abnormal triglycerides 161 on lipid panel. All other values WNL. Reports allergy to atorvastatin.  - Continue rosuvastatin - If allergic rxn, can try moderate intensity statin  HTN: Patient not compliant with home medication, Lisinopril 20mg  daily. BP 212/141 on admission; 183/100 and 148/66 this AM. - Cont home lisinopril at decreased dose of 10mg  qd - Monitor BP closely  as will need to normalize slowly over next few days. Increase lisinopril dose gradually back to home 20mg  prior to discharge.  - IV hydral PRN BP >220/120  DM: A1c (9/27) 8.9. CBG 106 this AM.  - Sensitive SSI  - CBG qACHS   ETOH abuse: Patient notes history of withdrawals in past but denies history of seizure. Serum EtOH 10mg /dL. Last drink 9/26. CIWA scores 6 overnight. Required no Ativan.  - CIWA protocol  - Thiamine, folate  Polycythemia  Hct remains at goal at 42.7 (09/29), goal is <45.  - Daily CBC  Neuropathy:  - Holding home gabapentin 300mg  BID   Possible OSA: - Outpatient sleep study recommended per neuro  FEN/GI: Dysphagia 1 diet PPx: SCDs  Disposition: CIR when bed available   Subjective:  Patient with no complaints this AM. He and his wife are excited for him to begin with CIR.   Objective: Temp:  [97.9 F (36.6 C)-98.5 F (36.9 C)] 98 F (36.7 C) (09/30 0547) Pulse Rate:  [71-78] 74 (09/30 0547) Resp:  [17-18] 18 (09/30 0547) BP: (144-193)/(66-114) 148/66 (09/30 0547) SpO2:  [96 %-100 %] 96 % (09/30 0547)  Physical Exam: General: Lying in bed in NAD Cardiovascular: RRR, no murmurs appreciated  Respiratory: CTAB, normal WOB on RA, no wheezes Abdomen: Soft, NTND, +BS Skin: Warm and dry Neuro: Awake and alert, able to follow commands and indicate wishes with gestures. 5/5 strength LUE and LLE. 5/5 strength in RLE, 4/5 in RUE.   Laboratory:  Recent Labs Lab 10/23/16 0906 10/23/16 1005 10/24/16 0700 10/25/16 0428  WBC 5.4  --  6.5 5.3  HGB 14.9 16.3  14.2 13.8  HCT 46.1 48.0 42.9 42.7  PLT 147*  --  138* 135*    Recent Labs Lab 10/23/16 0906 10/23/16 1005 10/24/16 0700 10/25/16 0428  NA 136 138 138 138  K 4.0 4.6 3.5 3.3*  CL 102 103 104 106  CO2 24  --  26 24  BUN 5* 7 9 10   CREATININE 1.02 0.90 1.08 1.03  CALCIUM 9.2  --  8.9 8.5*  PROT 7.3  --   --   --   BILITOT 0.8  --   --   --   ALKPHOS 75  --   --   --   ALT 17  --    --   --   AST 24  --   --   --   GLUCOSE 189* 196* 130* 129*    Imaging/Diagnostic Tests: No results found.  Verner Mould, MD 10/26/2016, 6:56 AM [facility] Intern pager: [phone], text pages welcome

## 2016-10-25 NOTE — Progress Notes (Signed)
  Echocardiogram 2D Echocardiogram has been performed.  Eric Morton 10/25/2016, 9:54 AM

## 2016-10-25 NOTE — Evaluation (Signed)
Physical Therapy Evaluation Patient Details Name: Eric Morton MRN: [id number] DOB: [birth year] Today's Date: 10/25/2016   History of Present Illness  Eric Morton is a 51 y.o. male presenting with right sided weakness and aphasia. PMH is significant for CKD, HTN, HLD, polysubstance abuse, polycythemia vera, DM2. MRI Numerous acute supra- and infratentorial infarcts, largest measuring 18 x 7 mm LEFT basal ganglia. Distribution suggests embolic etiology. 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   Clinical Impression  Pt admitted with above diagnosis. Pt currently with functional limitations due to the deficits listed below (see PT Problem List). Demonstrates significant ataxia with preserved LE muscle strength grossly. Mod assist for gait due to instability and difficulty with sequencing. Progressed from Mod to [place] assist quickly today with safety education and use of RW for support. Very eager to work with therapy. Pt will benefit from skilled PT to increase their independence and safety with mobility to allow discharge to the venue listed below.       Follow Up Recommendations CIR    Equipment Recommendations  None recommended by PT (TBD next venue of care)    Recommendations for Other Services Rehab consult     Precautions / Restrictions Precautions Precautions: Fall Restrictions Weight Bearing Restrictions: No      Mobility  Bed Mobility Overal bed mobility: Needs Assistance Bed Mobility: Supine to Sit;Sit to Supine     Supine to sit: Min assist Sit to supine: Min guard   General bed mobility comments: Min assist for truncal support and balance while rising to EOB for safety as well. Min guard with return to bed, using VC for technique.  Transfers Overall transfer level: Needs assistance Equipment used: Rolling walker (2 wheeled) Transfers: Sit to/from Stand Sit to Stand: Mod assist         General transfer comment: Mod assist for balance with rising from bed x5 and chair with  arm rests x2. Tactile cues for UE placement. Verbal cues for hand placement and safety. Progressed to a min assist level from bed.  Ambulation/Gait Ambulation/Gait assistance: Mod assist Ambulation Distance (Feet): 15 Feet (x4) Assistive device: Rolling walker (2 wheeled) Gait Pattern/deviations: Step-to pattern;Decreased stride length;Decreased stance time - right;Decreased dorsiflexion - right;Ataxic;Drifts right/left;Narrow base of support Gait velocity: decreased Gait velocity interpretation: <1.8 ft/sec, indicative of risk for recurrent falls General Gait Details: Able to ambulate several short distance bouts today with mod assist for balance, demonstrating signficant ataxia, specifically noteable with RLE. Responds well to verbal cues but requires assist with walker navigation, and sequencing of gait including RLE foot placement.  Stairs            Wheelchair Mobility    Modified Rankin (Stroke Patients Only) Modified Rankin (Stroke Patients Only) Pre-Morbid Rankin Score: No symptoms Modified Rankin: Moderately severe disability     Balance Overall balance assessment: Needs assistance Sitting-balance support: Feet supported;No upper extremity supported Sitting balance-Leahy Scale: Fair Sitting balance - Comments: initially requiring UE support but progressed to unsupported sitting, able to dynamically move LEs and shift weight, difficulty with Rt weight shift and return to midline   Standing balance support: Single extremity supported Standing balance-Leahy Scale: Poor Standing balance comment: Able hold RW with 1 UE for short periods of time statically                             Pertinent Vitals/Pain Pain Assessment: No/denies pain Pain Intervention(s): Monitored during session;Repositioned    Home Living Family/patient  expects to be discharged to:: Inpatient rehab Living Arrangements: Other relatives Available Help at Discharge: Family;Available 24  hours/day Type of Home: Apartment Home Access: Level entry     Home Layout: One level Home Equipment: None      Prior Function Level of Independence: Independent               Hand Dominance   Dominant Hand: Right    Extremity/Trunk Assessment   Upper Extremity Assessment Upper Extremity Assessment: Defer to OT evaluation    Lower Extremity Assessment Lower Extremity Assessment: RLE deficits/detail RLE Deficits / Details: Excellent strength throughout BIL LEs grossly with muscle testing however demonstrates notable ataxia with functional tasks including sit<>stand and gait.  RLE Sensation: decreased proprioception RLE Coordination: decreased gross motor       Communication   Communication: Expressive difficulties  Cognition Arousal/Alertness: Awake/alert Behavior During Therapy: Impulsive Overall Cognitive Status: Difficult to assess Area of Impairment: Safety/judgement;Problem solving                         Safety/Judgement: Decreased awareness of safety;Decreased awareness of deficits   Problem Solving: Requires verbal cues General Comments: Responds well to verbal cues and was very receptive of safety instructions once educated.      General Comments      Exercises     Assessment/Plan    PT Assessment Patient needs continued PT services  PT Problem List Decreased strength;Decreased range of motion;Decreased activity tolerance;Decreased balance;Decreased mobility;Decreased knowledge of use of DME;Decreased coordination;Decreased safety awareness;Decreased knowledge of precautions;Impaired tone       PT Treatment Interventions DME instruction;Gait training;Functional mobility training;Therapeutic activities;Therapeutic exercise;Balance training;Neuromuscular re-education;Patient/family education    PT Goals (Current goals can be found in the Care Plan section)  Acute Rehab PT Goals Patient Stated Goal: Go to rehab PT Goal Formulation:  With patient Time For Goal Achievement: 11/08/16 Potential to Achieve Goals: Good    Frequency Min 4X/week   Barriers to discharge        Co-evaluation               AM-PAC PT "6 Clicks" Daily Activity  Outcome Measure Difficulty turning over in bed (including adjusting bedclothes, sheets and blankets)?: A Little Difficulty moving from lying on back to sitting on the side of the bed? : A Lot Difficulty sitting down on and standing up from a chair with arms (e.g., wheelchair, bedside commode, etc,.)?: A Lot Help needed moving to and from a bed to chair (including a wheelchair)?: A Lot Help needed walking in hospital room?: A Lot Help needed climbing 3-5 steps with a railing? : Total 6 Click Score: 12    End of Session Equipment Utilized During Treatment: Gait belt Activity Tolerance: Patient tolerated treatment well Patient left: in bed;with call bell/phone within reach;with bed alarm set;with family/visitor present Nurse Communication: Mobility status;Precautions PT Visit Diagnosis: Apraxia (R48.2);Hemiplegia and hemiparesis Hemiplegia - Right/Left: Right Hemiplegia - dominant/non-dominant: Dominant Hemiplegia - caused by: Cerebral infarction    Time: 1130-1154 PT Time Calculation (min) (ACUTE ONLY): 24 min   Charges:   PT Evaluation $PT Eval High Complexity: 1 High PT Treatments $Gait Training: 8-22 mins   PT G CodesCamille Bal [place], [place] [phone]   Ellouise Newer 10/25/2016, 12:37 PM

## 2016-10-25 NOTE — Evaluation (Signed)
Speech Language Pathology Evaluation Patient Details Name: Eric Morton MRN: [id number] DOB: [birth year] Today's Date: 10/25/2016 Time: 1010-1040 SLP Time Calculation (min) (ACUTE ONLY): 30 min  Problem List:  Patient Active Problem List   Diagnosis Date Noted  . Alcohol use   . Cocaine use   . Dysarthria   . Oropharyngeal dysphagia   . Left-sided weakness 10/23/2016  . Cerebral thrombosis with cerebral infarction 10/23/2016  . Aphasia   . Hyperlipidemia 07/27/2015  . Cerebrovascular accident (CVA) due to occlusion of cerebral artery ([place])   . Stroke ([place]) 03/21/2015  . Polycythemia vera ([place]) 03/21/2015  . Hx of adenomatous colonic polyps   . Dehydration   . Diarrhea   . Protein-calorie malnutrition ([place])   . Cellulitis of breast of male 09/25/2011  . HTN (hypertension) 09/25/2011  . DM (diabetes mellitus) ([place]) 09/25/2011  . Leukopenia 09/25/2011  . Alcohol abuse 09/25/2011  . Cocaine abuse 09/25/2011  . Tobacco use 09/25/2011  . Neuropathy 09/25/2011   Past Medical History:  Past Medical History:  Diagnosis Date  . 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 Anxiety   . Chronic kidney disease    "jsut a little damage"  . Glaucoma   . History of stomach ulcers    not confirmed, never underwent diagnostic endo or radiology, this manifested as epigastric distress.   [name] Hx of adenomatous colonic polyps   . Hypercholesterolemia   . Hypertension   . Peripheral neuropathy   . Polycythemia vera ([place]) 2006  . Substance abuse   . Type II diabetes mellitus ([place])    Past Surgical History:  Past Surgical History:  Procedure Laterality Date  . COLONOSCOPY WITH PROPOFOL N/A 04/28/2014   Procedure: COLONOSCOPY WITH PROPOFOL;  Surgeon: Gatha Mayer, MD;  Location: [facility];  Service: Endoscopy;  Laterality: N/A;  . IRRIGATION AND DEBRIDEMENT ABSCESS  09/26/2011   Procedure: IRRIGATION AND DEBRIDEMENT ABSCESS;  Surgeon: Imogene Burn. Georgette Dover, MD;  Location: [facility];   Service: General;  Laterality: Left;  Incision and Drainage left breast abscess   HPI:  Pt is a 51 y.o. male presented to ED 9/27 with R side weakness/ aphasia/ dysarthria. PMH is significant for CKD, HTN, HLD, polysubstance abuse, polycythemia vera, DM2. MRI showed Numerous acute supra- and infratentorial infarcts, largest measuring 18 x 7 mm LEFT basal ganglia. Distribution suggests embolic etiology; old L cerebellar infarcts. MBS previous date- recommended dysphagia 1/ honey thick liquids by tsp only. Cognitive-linguistic evaluation ordered.    Assessment / Plan / Recommendation Clinical Impression  Pt currently has a severe dysarthria characterized by impaired articulation and hypernasality, with speech intelligibility reduced at the single word level; intelligibility judged to be at about 30% in connected speech. Pt and wife feel that speech is improving. Receptive and expressive language appears intact for tasks assessed, although limited due to dysarthria. Pt easily frustrated and emotional due to communication difficulties. Pt will benefit from continued speech therapy both acutely and at next level of care for expressive communication needs. Pt declined attempts at writing tasks; stated that he tried earlier and it was difficult. Plan to provide communication board. Also reviewed intelligibility strategies- slow rate, over-articulate, open mouth wider while speaking. Will continue to follow.    SLP Assessment  SLP Recommendation/Assessment: Patient needs continued Speech Lanaguage Pathology Services SLP Visit Diagnosis: Dysarthria and anarthria (R47.1)    Follow Up Recommendations  Inpatient Rehab    Frequency and Duration min 3x week  2  weeks      SLP Evaluation Cognition  Overall Cognitive Status: Difficult to assess Arousal/Alertness: Awake/alert Orientation Level: Oriented X4 Attention: Selective Selective Attention: Appears intact Memory: Appears intact Awareness: Appears  intact Behaviors: Poor frustration tolerance Safety/Judgment: Appears intact       Comprehension  Auditory Comprehension Overall Auditory Comprehension: Appears within functional limits for tasks assessed Yes/No Questions: Within Functional Limits Commands: Within Functional Limits Conversation: Simple Interfering Components: Anxiety Reading Comprehension Reading Status: Unable to assess (comment)    Expression Expression Primary Mode of Expression: Verbal Verbal Expression Overall Verbal Expression: Appears within functional limits for tasks assessed Initiation: No impairment Automatic Speech: Name;Day of week Level of Generative/Spontaneous Verbalization: Sentence Naming: No impairment Interfering Components: Speech intelligibility Non-Verbal Means of Communication: Other (comment) (plan to provide communication board) Written Expression Written Expression:  (pt did not attempt writing tasks; stated he tried earlier)   Oral / Motor  Oral Motor/Sensory Function Overall Oral Motor/Sensory Function: Moderate impairment Facial ROM: Reduced right;Suspected CN VII (facial) dysfunction Facial Symmetry: Abnormal symmetry right;Suspected CN VII (facial) dysfunction Facial Strength: Reduced right;Suspected CN VII (facial) dysfunction Lingual ROM: Within Functional Limits Lingual Symmetry: Within Functional Limits Motor Speech Overall Motor Speech: Impaired Respiration: Within functional limits Resonance: Hypernasality Articulation: Impaired Level of Impairment: Word Intelligibility: Intelligibility reduced Word: 25-49% accurate Phrase: 25-49% accurate Sentence: 25-49% accurate Motor Planning: Witnin functional limits Motor Speech Errors: Aware Interfering Components: Premorbid status Effective Techniques: Slow rate;Over-articulate   GO                    Kern Reap, [place], CCC-SLP 10/25/2016, 10:55 AM  [phone]

## 2016-10-25 NOTE — Consult Note (Signed)
Physical Medicine and Rehabilitation Consult Reason for Consult:right sided weakness, dysphagia Referring Physician: Erlinda Hong   HPI: Eric Morton is a 51 y.o. male with history of polysubstance abuse, CKD, HTN, and prior CVA in 2017 who presented on 10/23/16 with right hemiparesis and dysarthria. Work up revealed UDS + for cocaine. BP elevated into the 200'/100's requiring IV labetalol. MRI of the brain revealed numerous infarct including a large 18x54mm left basal ganglia infarct suggestive of an embolic etiology. Pt on D1/honey thick liquid dit currently.  PM&R was asked to see pt to assess for inpatient rehab needs.    Review of Systems  Constitutional: Negative for fever.  HENT: Negative for hearing loss.   Eyes: Negative for blurred vision.  Respiratory: Positive for cough.   Cardiovascular: Negative for palpitations.  Gastrointestinal: Negative for abdominal pain.  Genitourinary: Negative for dysuria.  Musculoskeletal: Negative for neck pain.  Skin: Negative for rash.  Neurological: Positive for speech change and focal weakness.  Psychiatric/Behavioral: Positive for substance abuse.   Past Medical History:  Diagnosis Date  . 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 Anxiety   . Chronic kidney disease    "jsut a little damage"  . Glaucoma   . History of stomach ulcers    not confirmed, never underwent diagnostic endo or radiology, this manifested as epigastric distress.   [name] Hx of adenomatous colonic polyps   . Hypercholesterolemia   . Hypertension   . Peripheral neuropathy   . Polycythemia vera ([place]) 2006  . Substance abuse   . Type II diabetes mellitus ([place])    Past Surgical History:  Procedure Laterality Date  . COLONOSCOPY WITH PROPOFOL N/A 04/28/2014   Procedure: COLONOSCOPY WITH PROPOFOL;  Surgeon: Gatha Mayer, MD;  Location: [facility];  Service: Endoscopy;  Laterality: N/A;  . IRRIGATION AND DEBRIDEMENT ABSCESS  09/26/2011   Procedure:  IRRIGATION AND DEBRIDEMENT ABSCESS;  Surgeon: Imogene Burn. Georgette Dover, MD;  Location: [facility];  Service: General;  Laterality: Left;  Incision and Drainage left breast abscess   Family History  Problem Relation Age of Onset  . Diabetes Mother   . Hypertension Mother    Social History:  reports that he has been smoking Cigarettes.  He has a 8.00 pack-year smoking history. He has never used smokeless tobacco. He reports that he drinks alcohol. He reports that he uses drugs, including Marijuana and "Crack" cocaine. Allergies: No Known Allergies Medications Prior to Admission  Medication Sig Dispense Refill  . aspirin EC 81 MG tablet Take 1 tablet (81 mg total) by mouth daily. 30 tablet 2  . gabapentin (NEURONTIN) 300 MG capsule Take 2 capsules (600 mg total) by mouth 2 (two) times daily. (Patient taking differently: Take 300 mg by mouth daily. ) 120 capsule 5  . glipiZIDE (GLUCOTROL) 5 MG tablet Take 1 tablet (5 mg total) by mouth 2 (two) times daily before a meal. (Patient taking differently: Take 5 mg by mouth daily before breakfast. ) 60 tablet 5  . lisinopril (PRINIVIL,ZESTRIL) 20 MG tablet Take 1 tablet (20 mg total) by mouth daily. 30 tablet 5  . metFORMIN (GLUCOPHAGE) 1000 MG tablet Take 1 tablet (1,000 mg total) by mouth 2 (two) times daily with a meal. (Patient taking differently: Take 1,000 mg by mouth daily with breakfast. ) 60 tablet 5  . simvastatin (ZOCOR) 40 MG tablet Take 1 tablet (40 mg total) by mouth at bedtime. 30 tablet 5  .  thiamine 100 MG tablet Take 1 tablet (100 mg total) by mouth daily. 30 tablet 2  . folic acid (FOLVITE) 1 MG tablet Take 1 tablet (1 mg total) by mouth daily. (Patient not taking: Reported on 10/23/2016) 30 tablet 1  . pantoprazole (PROTONIX) 40 MG tablet Take 1 tablet (40 mg total) by mouth daily at 6 (six) AM. (Patient not taking: Reported on 10/23/2016) 30 tablet 0    Home: Home Living Family/patient expects to be discharged to:: Private residence Living  Arrangements: Other relatives Available Help at Discharge: Family, Available 24 hours/day Type of Home: Apartment Home Access: Level entry Home Layout: One level Bathroom Shower/Tub: Chiropodist: Standard Home Equipment: None  Functional History: Prior Function Level of Independence: Independent Functional Status:  Mobility: Bed Mobility Overal bed mobility: Needs Assistance Bed Mobility: Supine to Sit Supine to sit: Min guard, HOB elevated General bed mobility comments: increased time Transfers Overall transfer level: Needs assistance Equipment used:  (standing in front of pt with gait belt and pt holding onto my arms) Transfers: Sit to/from Stand, [employer] Transfers Sit to Stand: Mod assist Stand pivot transfers: Max assist General transfer comment: for stand pivot ( blocking right knee, A'ing with weight shifting to move RLE and A to move RLE      ADL: ADL Overall ADL's : Needs assistance/impaired Eating/Feeding: Set up, Supervision/ safety Eating/Feeding Details (indicate cue type and reason): full S for meals due to impulsivity per SLP and pocketing Grooming: Moderate assistance, Sitting Upper Body Bathing: Moderate assistance, Sitting Lower Body Bathing: Maximal assistance Lower Body Bathing Details (indicate cue type and reason): MOd A sit<>stand, Max A to maintain standing Upper Body Dressing : Moderate assistance, Sitting Lower Body Dressing: Moderate assistance Lower Body Dressing Details (indicate cue type and reason): MOd A sit<>stand, Max A to maintain standing Toilet Transfer: Maximal assistance, Stand-pivot Toilet Transfer Details (indicate cue type and reason): blocking right knee Toileting- Clothing Manipulation and Hygiene: Maximal assistance Toileting - Clothing Manipulation Details (indicate cue type and reason): MOd A sit<>stand, Max A to maintain standing  Cognition: Cognition Overall Cognitive Status: Impaired/Different from  baseline Orientation Level: Oriented X4 Cognition Arousal/Alertness: Awake/alert Behavior During Therapy: Impulsive Overall Cognitive Status: Impaired/Different from baseline Area of Impairment: Safety/judgement, Problem solving Safety/Judgement: Decreased awareness of safety, Decreased awareness of deficits Problem Solving: Requires verbal cues  Blood pressure (!) 178/86, pulse 82, temperature 98.2 F (36.8 C), temperature source Oral, resp. rate 20, height 5\' 7"  (1.702 m), weight 74.1 kg (163 lb 4.8 oz), SpO2 100 %. Physical Exam  Constitutional: No distress.  HENT:  Head: Normocephalic.  Eyes: Pupils are equal, round, and reactive to light.  Neck: Normal range of motion.  Cardiovascular: Normal rate.   Respiratory: Effort normal.  GI: He exhibits no distension.  Musculoskeletal: He exhibits no edema.  Neurological:  Pt sitting EOB trying to eat breakfast. Significant right central 7 and tongue deviation. Severely dysarthric. RUE grossly 2+ to 3/5. RLE 3/5. Senses LT and pain fairly equally. Follows commands. Answered biographical information.     Results for orders placed or performed during the hospital encounter of 10/23/16 (from the past 24 hour(s))  Glucose, capillary     Status: Abnormal   Collection Time: 10/24/16 12:24 PM  Result Value Ref Range   Glucose-Capillary 191 (H) 65 - 99 mg/dL   Comment 1 Notify RN   Glucose, capillary     Status: Abnormal   Collection Time: 10/24/16  4:56 PM  Result Value  Ref Range   Glucose-Capillary 163 (H) 65 - 99 mg/dL  Glucose, capillary     Status: Abnormal   Collection Time: 10/24/16  7:55 PM  Result Value Ref Range   Glucose-Capillary 148 (H) 65 - 99 mg/dL  Glucose, capillary     Status: Abnormal   Collection Time: 10/24/16 10:05 PM  Result Value Ref Range   Glucose-Capillary 150 (H) 65 - 99 mg/dL  Glucose, capillary     Status: Abnormal   Collection Time: 10/25/16 12:11 AM  Result Value Ref Range   Glucose-Capillary 116 (H)  65 - 99 mg/dL  Glucose, capillary     Status: Abnormal   Collection Time: 10/25/16  4:05 AM  Result Value Ref Range   Glucose-Capillary 137 (H) 65 - 99 mg/dL  Basic metabolic panel     Status: Abnormal   Collection Time: 10/25/16  4:28 AM  Result Value Ref Range   Sodium 138 135 - 145 mmol/L   Potassium 3.3 (L) 3.5 - 5.1 mmol/L   Chloride 106 101 - 111 mmol/L   CO2 24 22 - 32 mmol/L   Glucose, Bld 129 (H) 65 - 99 mg/dL   BUN 10 6 - 20 mg/dL   Creatinine, Ser 1.03 0.61 - 1.24 mg/dL   Calcium 8.5 (L) 8.9 - 10.3 mg/dL   GFR calc non Af Amer >60 >60 mL/min   GFR calc Af Amer >60 >60 mL/min   Anion gap 8 5 - 15  CBC     Status: Abnormal   Collection Time: 10/25/16  4:28 AM  Result Value Ref Range   WBC 5.3 4.0 - 10.5 K/uL   RBC 4.91 4.22 - 5.81 MIL/uL   Hemoglobin 13.8 13.0 - 17.0 g/dL   HCT 42.7 39.0 - 52.0 %   MCV 87.0 78.0 - 100.0 fL   MCH 28.1 26.0 - 34.0 pg   MCHC 32.3 30.0 - 36.0 g/dL   RDW 13.3 11.5 - 15.5 %   Platelets 135 (L) 150 - 400 K/uL  Glucose, capillary     Status: Abnormal   Collection Time: 10/25/16  8:02 AM  Result Value Ref Range   Glucose-Capillary 123 (H) 65 - 99 mg/dL   Ct Angio Head W Or Wo Contrast  Result Date: 10/23/2016 CLINICAL DATA:  51 year old male code stroke. Altered mental status with waxing and waning abnormal speech and extremity weakness in the ED. Last known normal last night. Clinically the patient symptoms implicate left cerebral ischemia. EXAM: CT ANGIOGRAPHY HEAD AND NECK TECHNIQUE: Multidetector CT imaging of the head and neck was performed using the standard protocol during bolus administration of intravenous contrast. Multiplanar CT image reconstructions and MIPs were obtained to evaluate the vascular anatomy. Carotid stenosis measurements (when applicable) are obtained utilizing NASCET criteria, using the distal internal carotid diameter as the denominator. CONTRAST:  100 mL Isovue 370, in addition to the contrast volume reported on the  CT perfusion study today COMPARISON:  CT head without contrast and CT perfusion from today reported separately. FINDINGS: The initial CTA attempt was noncontrast and therefore nondiagnostic related to IV contrast extravasation. See details on the CT perfusion study today. Repeat CTA was performed at 1113 hours. CTA NECK Skeleton: Intermittent poor dentition. No acute osseous abnormality identified. Chronic right lamina papyracea fracture. Upper chest: Negative visualized lung parenchyma. No superior mediastinal mass or lymphadenopathy. Other neck: Negative.  No cervical lymphadenopathy. Could contrast timing on the repeat CTA images. Aortic arch: 3 vessel arch configuration with minimal arch  atherosclerosis. Right carotid system: Negative right CCA. Soft and calcified plaque at the right ICA origin and bulb but no associated stenosis. Left carotid system: Mild soft and calcified plaque at the left CCA origin without stenosis. Calcified plaque at the left ICA origin without stenosis. Vertebral arteries: No proximal right subclavian artery stenosis, minimal plaque. Normal right vertebral artery origin. The right vertebral is patent to the skullbase without stenosis. No proximal left subclavian artery stenosis, minimal soft plaque. The left vertebral artery is occluded at its origin and throughout the neck. CTA HEAD Posterior circulation: The distal right vertebral artery is patent but demonstrates moderate to severe atherosclerosis, and up to moderate stenosis in the V4 segment. The vertebrobasilar junction is patent. New line the distal left vertebral artery appears occluded to the level of L bulky calcified plaque seen on series 8, image 149. The distal left V4 segment appears remain patent including the left PICA origin, and is supplied in a retrograde fashion from the vertebrobasilar junction. The basilar artery is patent but highly irregular with moderate to severe multifocal distal basilar stenosis. Both PCA P1  segments are patent, although irregular and worse on the right side. Distal right PCA flow is more robust on the left. It is unclear whether the posterior communicating arteries are present. Anterior circulation: Both ICA siphons are patent but heavily calcified. Hemodynamically significant bilateral anterior genu and proximal supraclinoid segment stenosis is suspected. Both carotid termini are patent. The bilateral ACA A1 segments appear patent but diminutive. The anterior communicating artery and bilateral a 2 segments are patent but irregular. The left MCA M1 segment, bifurcation, and proximal left MCA branches are patent but irregular. There is more severe irregularity of the right MCA M1 segment and right MCA M2 branches, which demonstrate attenuated flow when compared to the left side. Venous sinuses: Patent. Anatomic variants: None. Review of the MIP images confirms the above findings IMPRESSION: 1. Negative for emergent large vessel occlusion. 2. 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 This appears stable since the 2017 brain MRI, and there is retrograde reconstitution of the distal left V4 segment and left PICA. 3. No significant carotid or right vertebral artery stenosis in the neck despite atherosclerosis. 4. 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 There are moderate or severe stenoses of the bilateral ICA siphons, the right vertebral artery V4 segment which supplies the basilar, the basilar artery, and the right MCA M1 and right PCA P1 segments. These findings have progressed since the 2017 MRA. 5. Attenuated flow in the right MCA and right PCA branches, but better maintained flow in the bilateral ACA, left MCA and left PCA branches. 6. This study was reviewed in person with Dr. Erlinda Hong on 10/23/2016 at 1140 hours. Electronically Signed   By: Genevie Ann M.D.   On: 10/23/2016  11:47   Ct Angio Neck W And/or Wo Contrast  Result Date: 10/23/2016 CLINICAL DATA:  51 year old male code stroke. Altered mental status with waxing and waning abnormal speech and extremity weakness in the ED. Last known normal last night. Clinically the patient symptoms implicate left cerebral ischemia. EXAM: CT ANGIOGRAPHY HEAD AND NECK TECHNIQUE: Multidetector CT imaging of the head and neck was performed using the standard protocol during bolus administration of intravenous contrast. Multiplanar CT image reconstructions and MIPs were obtained to evaluate the vascular anatomy. Carotid stenosis measurements (when applicable) are obtained utilizing NASCET criteria, using  the distal internal carotid diameter as the denominator. CONTRAST:  100 mL Isovue 370, in addition to the contrast volume reported on the CT perfusion study today COMPARISON:  CT head without contrast and CT perfusion from today reported separately. FINDINGS: The initial CTA attempt was noncontrast and therefore nondiagnostic related to IV contrast extravasation. See details on the CT perfusion study today. Repeat CTA was performed at 1113 hours. CTA NECK Skeleton: Intermittent poor dentition. No acute osseous abnormality identified. Chronic right lamina papyracea fracture. Upper chest: Negative visualized lung parenchyma. No superior mediastinal mass or lymphadenopathy. Other neck: Negative.  No cervical lymphadenopathy. Could contrast timing on the repeat CTA images. Aortic arch: 3 vessel arch configuration with minimal arch atherosclerosis. Right carotid system: Negative right CCA. Soft and calcified plaque at the right ICA origin and bulb but no associated stenosis. Left carotid system: Mild soft and calcified plaque at the left CCA origin without stenosis. Calcified plaque at the left ICA origin without stenosis. Vertebral arteries: No proximal right subclavian artery stenosis, minimal plaque. Normal right vertebral artery origin. The right  vertebral is patent to the skullbase without stenosis. No proximal left subclavian artery stenosis, minimal soft plaque. The left vertebral artery is occluded at its origin and throughout the neck. CTA HEAD Posterior circulation: The distal right vertebral artery is patent but demonstrates moderate to severe atherosclerosis, and up to moderate stenosis in the V4 segment. The vertebrobasilar junction is patent. New line the distal left vertebral artery appears occluded to the level of L bulky calcified plaque seen on series 8, image 149. The distal left V4 segment appears remain patent including the left PICA origin, and is supplied in a retrograde fashion from the vertebrobasilar junction. The basilar artery is patent but highly irregular with moderate to severe multifocal distal basilar stenosis. Both PCA P1 segments are patent, although irregular and worse on the right side. Distal right PCA flow is more robust on the left. It is unclear whether the posterior communicating arteries are present. Anterior circulation: Both ICA siphons are patent but heavily calcified. Hemodynamically significant bilateral anterior genu and proximal supraclinoid segment stenosis is suspected. Both carotid termini are patent. The bilateral ACA A1 segments appear patent but diminutive. The anterior communicating artery and bilateral a 2 segments are patent but irregular. The left MCA M1 segment, bifurcation, and proximal left MCA branches are patent but irregular. There is more severe irregularity of the right MCA M1 segment and right MCA M2 branches, which demonstrate attenuated flow when compared to the left side. Venous sinuses: Patent. Anatomic variants: None. Review of the MIP images confirms the above findings IMPRESSION: 1. Negative for emergent large vessel occlusion. 2. 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 This appears stable since the 2017 brain MRI, and there is retrograde  reconstitution of the distal left V4 segment and left PICA. 3. No significant carotid or right vertebral artery stenosis in the neck despite atherosclerosis. 4. 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 There are moderate or severe stenoses of the bilateral ICA siphons, the right vertebral artery V4 segment which supplies the basilar, the basilar artery, and the right MCA M1 and right PCA P1 segments. These findings have progressed since the 2017 MRA. 5. Attenuated flow in the right MCA and right PCA branches, but better maintained flow in the bilateral ACA, left MCA and left PCA branches. 6. This study was reviewed in  person with Dr. Erlinda Hong on 10/23/2016 at 1140 hours. Electronically Signed   By: Genevie Ann M.D.   On: 10/23/2016 11:47   Mr Brain Wo Contrast  Result Date: 10/23/2016 CLINICAL DATA:  Follow-up stroke. Known LEFT vertebral artery occlusion. EXAM: MRI HEAD WITHOUT CONTRAST TECHNIQUE: Axial and coronal diffusion weighted imaging, axial T2 FLAIR. Patient could not tolerate further imaging in the examination was terminated. COMPARISON:  CT HEAD October 23, 2016 at 1008 hours. FINDINGS: Severely motion degraded axial T2 FLAIR. BRAIN: 3 mm focus reduced diffusion central pons, not localized on ADC map. Punctate irregular likely artifact RIGHT inferior temporal lobe. Patchy reduced diffusion LEFT occipital lobe with low ADC values, punctate reduced diffusion RIGHT occipital lobe. 18 x 7 mm reduced diffusion LEFT basal ganglia with low ADC values, punctate reduced diffusion RIGHT basal ganglia. Subcentimeter reduced diffusion LEFT frontal lobe and RIGHT mesial frontal lobe. 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 Patchy supratentorial white matter FLAIR T2 hyperintensities exclusive of the aforementioned abnormality. No hydrocephalus. No midline shift or mass effect. No abnormal extra-axial fluid collections. VASCULAR: Not  assessed SKULL AND UPPER CERVICAL SPINE: Not assessed SINUSES/ORBITS: Not assessed OTHER: Not applicable IMPRESSION: 1. Limited 3 sequence motion degraded MRI of the head. 2. Numerous acute supra- and infratentorial infarcts, largest measuring 18 x 7 mm LEFT basal ganglia. Distribution suggests embolic etiology. 3. Moderate chronic small vessel ischemic disease better characterized on prior MRI. 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 Electronically Signed   By: Elon Alas M.D.   On: 10/23/2016 19:19   Ct Cerebral Perfusion W Contrast  Result Date: 10/23/2016 CLINICAL DATA:  51 year old male code stroke. Altered mental status and flaccid right arm. Last seen normal last night. EXAM: CT PERFUSION BRAIN TECHNIQUE: Multiphase CT imaging of the brain was performed following IV bolus contrast injection. Subsequent parametric perfusion maps were calculated using RAPID software. CONTRAST:  90 mL Isovue 370 IV contrast extravasation was observed in the CT suite during the CTA. Extravasated contrast volume estimated at 50 mL. Radiologist Dr. Janeece Fitting was consulted and examined the patient who was then returned to the ED for continued care. COMPARISON:  Head CT without contrast 1008 hours today. Brain MRI and intracranial MRA 03/21/2015 FINDINGS: CT Brain Perfusion Findings: CBF (<30%) Volume:  0 mL Perfusion (Tmax>6.0s) volume: 4 mL, inferior left temporal gyrus region. Where as less stringent T-max values begin to implicate contralateral right corona radiata tissue. Mismatch Volume:  4 mL Infarction Location:Not applicable CTA CTA head and neck was also attempted at this time, but IV extravasation occurred, such that the attempted CTA images are noncontrast. I discussed this by telephone with Dr. Erlinda Hong On 10/23/2016 at 1045 hours, and he advises a new IV placement and repeat CTA is planned at this time. IMPRESSION: 1. CT perfusion does NOT detect core infarct. And CTP suggests what are probably artifactual hypoperfusion  areas in both hemispheres. 2. Initial CTA was noncontrast and nondiagnostic due to 50 mL IV extravasation (see contrast section above). 3. I discussed the above by telephone with Dr. Erlinda Hong On 10/23/2016 at 1045 hours, and he advises a new IV placement and repeat CTA is planned at this time. Electronically Signed   By: Genevie Ann M.D.   On: 10/23/2016 10:50   Dg Swallowing Func-speech Pathology  Result Date: 10/24/2016 Objective Swallowing Evaluation: Type of Study: MBS-Modified Barium Swallow Study Patient Details Name: Eric Morton MRN: [id number] Date of Birth: 1966-01-20 Today's Date: 10/24/2016 Time: SLP Start Time (ACUTE ONLY): 1103-SLP Stop Time (ACUTE ONLY): 6073  SLP Time Calculation (min) (ACUTE ONLY): 18 min Past Medical History: Past Medical History: Diagnosis Date . Alcohol liver damage (HCC)   "just a little damage" . Anxiety  . Chronic kidney disease   "jsut a little damage" . Glaucoma  . History of stomach ulcers   not confirmed, never underwent diagnostic endo or radiology, this manifested as epigastric distress.  [name] Hx of adenomatous colonic polyps  . Hypercholesterolemia  . Hypertension  . Peripheral neuropathy  . Polycythemia vera ([place]) 2006 . Substance abuse  . Type II diabetes mellitus ([place])  Past Surgical History: Past Surgical History: Procedure Laterality Date . COLONOSCOPY WITH PROPOFOL N/A 04/28/2014  Procedure: COLONOSCOPY WITH PROPOFOL;  Surgeon: Gatha Mayer, MD;  Location: [facility];  Service: Endoscopy;  Laterality: N/A; . IRRIGATION AND DEBRIDEMENT ABSCESS  09/26/2011  Procedure: IRRIGATION AND DEBRIDEMENT ABSCESS;  Surgeon: Imogene Burn. Georgette Dover, MD;  Location: [facility];  Service: General;  Laterality: Left;  Incision and Drainage left breast abscess HPI: THERESA DOHRMAN a 51 y.o.malepresenting with right sided weakness and aphasia. PMH is significant for CKD, HTN, HLD, polysubstance abuse, polycythemia vera, DM2. MRI Numerous acute supra- and infratentorial infarcts, largest measuring 18 x 7  mm LEFT basal ganglia. Distribution suggests embolic etiology. Moderate chronic small vessel ischemic disease better characterized on prior MRI. 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 BSE 02/2015 recommended regular/thin. Family denies prior dysphagia with old strokes. MBS recommended following BSE.  No Data Recorded Assessment / Plan / Recommendation CHL IP CLINICAL IMPRESSIONS 10/24/2016 Clinical Impression Pt exhibited significantly decreased lingual cohesion/control leading to anterior spill, anterior sulci residue, holding, piecemeal swallow pattern and lingual residue. Laryngeal penetration before the swallow due to poor oral control reaching vocal cords, silently with nectar. Penetration during multiple swallows with nectar cleared vestibule. Pharyngeal residuals from mod-max given motor weakness. Requires max cues for volitional swallow that reduce quantity. Pt is impulsive and requires full supervisio with po's. Recommend Dys 1, honey via TEASPOON only, crush meds, sit upright, 2 swallows, check oral cavity for pocketing.       SLP Visit Diagnosis Dysphagia, oropharyngeal phase (R13.12) Attention and concentration deficit following -- Frontal lobe and executive function deficit following -- Impact on safety and function Severe aspiration risk   CHL IP TREATMENT RECOMMENDATION 10/24/2016 Treatment Recommendations Therapy as outlined in treatment plan below   Prognosis 10/24/2016 Prognosis for Safe Diet Advancement Good Barriers to Reach Goals Behavior Barriers/Prognosis Comment -- CHL IP DIET RECOMMENDATION 10/24/2016 SLP Diet Recommendations Dysphagia 1 (Puree) solids;Honey thick liquids Liquid Administration via Spoon Medication Administration Crushed with puree Compensations Slow rate;Minimize environmental distractions;Small sips/bites;Lingual sweep for clearance of pocketing;Monitor for anterior loss;Multiple dry swallows after each bite/sip Postural Changes Seated upright at 90 degrees   CHL IP OTHER RECOMMENDATIONS  10/24/2016 Recommended Consults -- Oral Care Recommendations Oral care BID Other Recommendations --   CHL IP FOLLOW UP RECOMMENDATIONS 10/24/2016 Follow up Recommendations Inpatient Rehab   CHL IP FREQUENCY AND DURATION 10/24/2016 Speech Therapy Frequency (ACUTE ONLY) min 2x/week Treatment Duration 2 weeks      CHL IP ORAL PHASE 10/24/2016 Oral Phase Impaired Oral - Pudding Teaspoon -- Oral - Pudding Cup -- Oral - Honey Teaspoon Delayed oral transit;Weak lingual manipulation;Holding of bolus;Pocketing in anterior sulcus;Lingual/palatal residue Oral - Honey Cup Delayed oral transit;Decreased bolus cohesion;Right anterior bolus loss;Weak lingual manipulation;Holding of bolus;Pocketing in anterior sulcus;Lingual/palatal residue;Piecemeal swallowing Oral - Nectar Teaspoon -- Oral - Nectar Cup Decreased bolus cohesion;Delayed oral transit;Reduced posterior propulsion;Lingual/palatal residue;Pocketing in anterior sulcus;Right anterior bolus loss;Premature  spillage Oral - Nectar Straw -- Oral - Thin Teaspoon -- Oral - Thin Cup -- Oral - Thin Straw -- Oral - Puree Delayed oral transit;Weak lingual manipulation;Lingual/palatal residue Oral - Mech Soft -- Oral - Regular -- Oral - Multi-Consistency -- Oral - Pill -- Oral Phase - Comment --  CHL IP PHARYNGEAL PHASE 10/24/2016 Pharyngeal Phase Impaired Pharyngeal- Pudding Teaspoon -- Pharyngeal -- Pharyngeal- Pudding Cup -- Pharyngeal -- Pharyngeal- Honey Teaspoon Penetration/Aspiration during swallow;Pharyngeal residue - valleculae;Pharyngeal residue - pyriform;Reduced epiglottic inversion;Reduced laryngeal elevation Pharyngeal Material enters airway, remains ABOVE vocal cords then ejected out Pharyngeal- Honey Cup Pharyngeal residue - valleculae;Pharyngeal residue - pyriform;Reduced epiglottic inversion;Reduced laryngeal elevation Pharyngeal -- Pharyngeal- Nectar Teaspoon -- Pharyngeal -- Pharyngeal- Nectar Cup Penetration/Aspiration during swallow;Pharyngeal residue -  pyriform;Pharyngeal residue - valleculae;Reduced epiglottic inversion;Reduced laryngeal elevation Pharyngeal Material enters airway, passes BELOW cords then ejected out;Material enters airway, remains ABOVE vocal cords and not ejected out Pharyngeal- Nectar Straw -- Pharyngeal -- Pharyngeal- Thin Teaspoon -- Pharyngeal -- Pharyngeal- Thin Cup -- Pharyngeal -- Pharyngeal- Thin Straw -- Pharyngeal -- Pharyngeal- Puree Pharyngeal residue - valleculae;Reduced tongue base retraction Pharyngeal -- Pharyngeal- Mechanical Soft -- Pharyngeal -- Pharyngeal- Regular -- Pharyngeal -- Pharyngeal- Multi-consistency -- Pharyngeal -- Pharyngeal- Pill -- Pharyngeal -- Pharyngeal Comment --  CHL IP CERVICAL ESOPHAGEAL PHASE 10/24/2016 Cervical Esophageal Phase WFL Pudding Teaspoon -- Pudding Cup -- Honey Teaspoon -- Honey Cup -- Nectar Teaspoon -- Nectar Cup -- Nectar Straw -- Thin Teaspoon -- Thin Cup -- Thin Straw -- Puree -- Mechanical Soft -- Regular -- Multi-consistency -- Pill -- Cervical Esophageal Comment -- No flowsheet data found. [place] Siren 10/24/2016, 12:24 PM Orbie Pyo Colvin Caroli.Ed CCC-SLP Pager [phone]              Ct Head Code Stroke Wo Contrast`  Result Date: 10/23/2016 CLINICAL DATA:  Code stroke. 51 year old male with altered mental status, right arm flaccid. Last known normal last night. EXAM: CT HEAD WITHOUT CONTRAST TECHNIQUE: Contiguous axial images were obtained from the base of the skull through the vertex without intravenous contrast. COMPARISON:  Brain MRI, intracranial MRA and head CT 03/21/2015 FINDINGS: Brain: Chronic deep gray matter, left cerebellar, and scattered white matter lacunar infarcts as demonstrated on the 2017 MRI. Expected Evolution of the 2017 right corona radiata infarct. Gray-white matter differentiation elsewhere appears stable. No cortically based acute infarct identified. No acute intracranial hemorrhage identified. No midline shift, mass effect, or evidence of  intracranial mass lesion. No ventriculomegaly. Vascular: Calcified atherosclerosis at the skull base. No suspicious intracranial vascular hyperdensity. Skull: Chronic right lamina papyracea fracture. No acute osseous abnormality identified. Sinuses/Orbits: Chronic ethmoid opacification. Chronic right frontoethmoidal recess opacification. Stable paranasal sinuses. Mastoids and tympanic cavities remain clear. Other: Mild leftward gaze deviation. No acute orbit or scalp soft tissue findings. ASPECTS [facility] Stroke Program Early CT Score) - Ganglionic level infarction (caudate, lentiform nuclei, internal capsule, insula, M1-M3 cortex): 7 - Supraganglionic infarction (M4-M6 cortex): 3 Total score (0-10 with 10 being normal): 10 IMPRESSION: 1. 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 No acute cortically based infarct or acute intracranial hemorrhage identified. 2. ASPECTS is 10. 3. The above was relayed via text pager to Dr. Erlinda Hong On 10/23/2016 at 10:16 . Electronically Signed   By: Genevie Ann M.D.   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 Does the need  for close, 24 hr/day medical supervision in concert with the patient's rehab needs make it unreasonable for this patient to be served in a less intensive setting? Yes 2. Co-Morbidities requiring supervision/potential complications: HTN, polysubstance abuse,  3. Due to bladder management, bowel management, safety, skin/wound care, disease management, medication administration, pain management and patient education, does the patient require 24 hr/day rehab nursing? Yes 4. Does the patient require coordinated care of a physician, rehab nurse, PT (1-2 hrs/day, 5 days/week), OT (1-2 hrs/day, 5 days/week) and SLP (1-2 hrs/day, 5 days/week) to address physical and functional deficits  in the context of the above medical diagnosis(es)? Yes Addressing deficits in the following areas: balance, endurance, locomotion, strength, transferring, bowel/bladder control, bathing, dressing, feeding, grooming, toileting, cognition, speech, swallowing and psychosocial support 5. Can the patient actively participate in an intensive therapy program of at least 3 hrs of therapy per day at least 5 days per week? Yes 6. The potential for patient to make measurable gains while on inpatient rehab is good 7. Anticipated functional outcomes upon discharge from inpatient rehab are modified independent and supervision  with PT, modified independent and supervision with OT, supervision and min assist with SLP. 8. Estimated rehab length of stay to reach the above functional goals is: 7-12 days 9. Anticipated D/C setting: Home 10. Anticipated post D/C treatments: HH therapy and Outpatient therapy 11. Overall Rehab/Functional Prognosis: excellent  RECOMMENDATIONS: This patient's condition is appropriate for continued rehabilitative care in the following setting: CIR Patient has agreed to participate in recommended program. Yes Note that insurance prior authorization may be required for reimbursement for recommended care.  Comment: Rehab Admissions Coordinator to follow up.  Thanks,  Meredith Staggers, MD, Mellody Drown    Meredith Staggers, MD 10/25/2016

## 2016-10-25 NOTE — Discharge Summary (Signed)
[facility] Discharge Summary  Patient name: Eric Morton Medical record number: [id number] Date of birth: [birth year] Age: 51 y.o. Gender: male Date of Admission: 10/23/2016  Date of Discharge: 10/27/16 Admitting Physician: Eric Covert, MD  Primary Care Provider: Arnoldo Morale, MD Consultants: Neuro  Indication for Hospitalization: R sided weakness and dysarthria  Discharge Diagnoses/Problem List:  Patient Active Problem List   Diagnosis Date Noted  . Alcohol use   . Cocaine use   . Dysarthria   . Oropharyngeal dysphagia   . Left-sided weakness 10/23/2016  . Cerebral thrombosis with cerebral infarction 10/23/2016  . Aphasia   . Hyperlipidemia 07/27/2015  . Cerebrovascular accident (CVA) due to occlusion of cerebral artery ([place])   . Stroke ([place]) 03/21/2015  . Polycythemia vera ([place]) 03/21/2015  . Hx of adenomatous colonic polyps   . Dehydration   . Diarrhea   . Protein-calorie malnutrition ([place])   . Cellulitis of breast of male 09/25/2011  . HTN (hypertension) 09/25/2011  . DM (diabetes mellitus) ([place]) 09/25/2011  . Leukopenia 09/25/2011  . Alcohol abuse 09/25/2011  . Cocaine abuse ([place]) 09/25/2011  . Tobacco use 09/25/2011  . Neuropathy 09/25/2011     Disposition: CIR  Discharge Condition: Stable  Discharge Exam: General: Sitting on the bed, eating breakfast, no acute distress. Cardiovascular: Regular rate and rhythm.  Respiratory: CTA bilaterally, no wheezing. Good effort. On RA.  Abdomen: Soft, non-tender. BS are present.   Extremities: 3/5 strength in right UE. 4/5 strength in RLE. 5/5 strength in UE and LE on left.  Neuro: Awake and alert, able to follow commands and indicate wishes with cards or gestures. Sensation intact bilaterally. CN II-XII intact except for mild right facial droop, drooling, severe dysarthria. Patellar reflexes 1+ bilaterally.  Brief Hospital Course:  Patient presented with R-sided weakness and  dysarthria. Had residual R-sided weakness and slurred speech from prior CVA, however had markedly worsened. Was outside of tPA treatment window on admission. Code stroke was called and patient was admitted for stroke work-up. CT head did not show acute stroke but did small vessel disease. MRI brain with numerous acute supra- and infratentorial infarcts, largest measuring 18 x 7 mm LEFT basal ganglia. Distribution suggests embolic etiology, however echo (TTE) showed no cardiac source of emboli. Neurology (Dr. Leonie Morton) didn't think TEE is needed as the cause of his stroke was thought to be due to severe atherosclerosis and cocaine use as UDS was positive.   Patient was  evaluated by SLP who suggested dysphagia 1 diet. He was also evaluated by PT/OT who recommended CIR. Accordingly, he was evaluated by PM & R and accepted to CIR.   Patient was discharged to CIR on DAPT and Crestor per neurology recommendation. Patient will be on DAPT (ASA 81 and Plavix 75 mg) for three months, then Asprin 81 mg daily. He will follow up with [facility] in 6 weeks.   Issues for Follow Up:  1. Per neuro, recommend 30 d cardiac event monitoring as outpatient to rule out a.fib as source of emboli. Cardiology was consulted and will arrange this 2. Started on dual anti-platelet therapy with ASA 81 mg and Plavix during admission. Plan to continue DAPT for 3 months, then transition to ASA 81 mg alone. This was verified with Dr. Leonie Morton (neurology) over the phone prior to discharge. 3. Crestor 40 mg started.  4. Patient should continue on dysphagia 1 diet and honey thick liquids.  5. Patient with symptoms of OSA throughout admission. Recommend outpatient  sleep study.   Significant Procedures: None  Significant Labs and Imaging:   Recent Labs Lab 10/24/16 0700 10/25/16 0428 10/27/16 0712  WBC 6.5 5.3 4.9  HGB 14.2 13.8 13.9  HCT 42.9 42.7 42.7  PLT 138* 135* 140*    Recent Labs Lab 10/23/16 0906 10/23/16 1005 10/24/16 0700  10/25/16 0428 10/27/16 0712  NA 136 138 138 138 138  K 4.0 4.6 3.5 3.3* 3.6  CL 102 103 104 106 108  CO2 24  --  26 24 22   GLUCOSE 189* 196* 130* 129* 106*  BUN 5* 7 9 10 11   CREATININE 1.02 0.90 1.08 1.03 0.98  CALCIUM 9.2  --  8.9 8.5* 8.7*  ALKPHOS 75  --   --   --   --   AST 24  --   --   --   --   ALT 17  --   --   --   --   ALBUMIN 3.2*  --   --   --   --     Mr Brain Wo Contrast  Result Date: 10/23/2016 CLINICAL DATA:  Follow-up stroke. Known LEFT vertebral artery occlusion. EXAM: MRI HEAD WITHOUT CONTRAST TECHNIQUE: Axial and coronal diffusion weighted imaging, axial T2 FLAIR. Patient could not tolerate further imaging in the examination was terminated. COMPARISON:  CT HEAD October 23, 2016 at 1008 hours. FINDINGS: Severely motion degraded axial T2 FLAIR. BRAIN: 3 mm focus reduced diffusion central pons, not localized on ADC map. Punctate irregular likely artifact RIGHT inferior temporal lobe. Patchy reduced diffusion LEFT occipital lobe with low ADC values, punctate reduced diffusion RIGHT occipital lobe. 18 x 7 mm reduced diffusion LEFT basal ganglia with low ADC values, punctate reduced diffusion RIGHT basal ganglia. Subcentimeter reduced diffusion LEFT frontal lobe and RIGHT mesial frontal lobe. 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 Patchy supratentorial white matter FLAIR T2 hyperintensities exclusive of the aforementioned abnormality. No hydrocephalus. No midline shift or mass effect. No abnormal extra-axial fluid collections. VASCULAR: Not assessed SKULL AND UPPER CERVICAL SPINE: Not assessed SINUSES/ORBITS: Not assessed OTHER: Not applicable IMPRESSION: 1. Limited 3 sequence motion degraded MRI of the head. 2. Numerous acute supra- and infratentorial infarcts, largest measuring 18 x 7 mm LEFT basal ganglia. Distribution suggests embolic etiology. 3. Moderate chronic small vessel ischemic disease better characterized on prior MRI. 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 Electronically Signed   By:  Eric Morton M.D.   On: 10/23/2016 19:19   Dg Swallowing Func-speech Pathology  Result Date: 10/24/2016 Objective Swallowing Evaluation: Type of Study: MBS-Modified Barium Swallow Study Patient Details Name: Eric Morton MRN: [id number] Date of Birth: 06-Sep-1965 Today's Date: 10/24/2016 Time: SLP Start Time (ACUTE ONLY): 1103-SLP Stop Time (ACUTE ONLY): 1121 SLP Time Calculation (min) (ACUTE ONLY): 18 min Past Medical History: Past Medical History: Diagnosis Date . Alcohol liver damage (HCC)   "just a little damage" . Anxiety  . Chronic kidney disease   "jsut a little damage" . Glaucoma  . History of stomach ulcers   not confirmed, never underwent diagnostic endo or radiology, this manifested as epigastric distress.  [name] Hx of adenomatous colonic polyps  . Hypercholesterolemia  . Hypertension  . Peripheral neuropathy  . Polycythemia vera ([place]) 2006 . Substance abuse  . Type II diabetes mellitus ([place])  Past Surgical History: Past Surgical History: Procedure Laterality Date . COLONOSCOPY WITH PROPOFOL N/A 04/28/2014  Procedure: COLONOSCOPY WITH PROPOFOL;  Surgeon: Gatha Mayer, MD;  Location: [place];  Service: Endoscopy;  Laterality: N/A; . IRRIGATION  AND DEBRIDEMENT ABSCESS  09/26/2011  Procedure: IRRIGATION AND DEBRIDEMENT ABSCESS;  Surgeon: Imogene Burn. Georgette Dover, MD;  Location: [place];  Service: General;  Laterality: Left;  Incision and Drainage left breast abscess HPI: TYJAE ISSA a 51 y.o.malepresenting with right sided weakness and aphasia. PMH is significant for CKD, HTN, HLD, polysubstance abuse, polycythemia vera, DM2. MRI Numerous acute supra- and infratentorial infarcts, largest measuring 18 x 7 mm LEFT basal ganglia. Distribution suggests embolic etiology. Moderate chronic small vessel ischemic disease better characterized on prior MRI. 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 BSE 02/2015 recommended regular/thin. Family denies prior dysphagia with old strokes. MBS recommended following BSE.  No Data  Recorded Assessment / Plan / Recommendation CHL IP CLINICAL IMPRESSIONS 10/24/2016 Clinical Impression Pt exhibited significantly decreased lingual cohesion/control leading to anterior spill, anterior sulci residue, holding, piecemeal swallow pattern and lingual residue. Laryngeal penetration before the swallow due to poor oral control reaching vocal cords, silently with nectar. Penetration during multiple swallows with nectar cleared vestibule. Pharyngeal residuals from mod-max given motor weakness. Requires max cues for volitional swallow that reduce quantity. Pt is impulsive and requires full supervisio with po's. Recommend Dys 1, honey via TEASPOON only, crush meds, sit upright, 2 swallows, check oral cavity for pocketing.       SLP Visit Diagnosis Dysphagia, oropharyngeal phase (R13.12) Attention and concentration deficit following -- Frontal lobe and executive function deficit following -- Impact on safety and function Severe aspiration risk   CHL IP TREATMENT RECOMMENDATION 10/24/2016 Treatment Recommendations Therapy as outlined in treatment plan below   Prognosis 10/24/2016 Prognosis for Safe Diet Advancement Good Barriers to Reach Goals Behavior Barriers/Prognosis Comment -- CHL IP DIET RECOMMENDATION 10/24/2016 SLP Diet Recommendations Dysphagia 1 (Puree) solids;Honey thick liquids Liquid Administration via Spoon Medication Administration Crushed with puree Compensations Slow rate;Minimize environmental distractions;Small sips/bites;Lingual sweep for clearance of pocketing;Monitor for anterior loss;Multiple dry swallows after each bite/sip Postural Changes Seated upright at 90 degrees   CHL IP OTHER RECOMMENDATIONS 10/24/2016 Recommended Consults -- Oral Care Recommendations Oral care BID Other Recommendations --   CHL IP FOLLOW UP RECOMMENDATIONS 10/24/2016 Follow up Recommendations Inpatient Rehab   CHL IP FREQUENCY AND DURATION 10/24/2016 Speech Therapy Frequency (ACUTE ONLY) min 2x/week Treatment Duration 2  weeks      CHL IP ORAL PHASE 10/24/2016 Oral Phase Impaired Oral - Pudding Teaspoon -- Oral - Pudding Cup -- Oral - Honey Teaspoon Delayed oral transit;Weak lingual manipulation;Holding of bolus;Pocketing in anterior sulcus;Lingual/palatal residue Oral - Honey Cup Delayed oral transit;Decreased bolus cohesion;Right anterior bolus loss;Weak lingual manipulation;Holding of bolus;Pocketing in anterior sulcus;Lingual/palatal residue;Piecemeal swallowing Oral - Nectar Teaspoon -- Oral - Nectar Cup Decreased bolus cohesion;Delayed oral transit;Reduced posterior propulsion;Lingual/palatal residue;Pocketing in anterior sulcus;Right anterior bolus loss;Premature spillage Oral - Nectar Straw -- Oral - Thin Teaspoon -- Oral - Thin Cup -- Oral - Thin Straw -- Oral - Puree Delayed oral transit;Weak lingual manipulation;Lingual/palatal residue Oral - Mech Soft -- Oral - Regular -- Oral - Multi-Consistency -- Oral - Pill -- Oral Phase - Comment --  CHL IP PHARYNGEAL PHASE 10/24/2016 Pharyngeal Phase Impaired Pharyngeal- Pudding Teaspoon -- Pharyngeal -- Pharyngeal- Pudding Cup -- Pharyngeal -- Pharyngeal- Honey Teaspoon Penetration/Aspiration during swallow;Pharyngeal residue - valleculae;Pharyngeal residue - pyriform;Reduced epiglottic inversion;Reduced laryngeal elevation Pharyngeal Material enters airway, remains ABOVE vocal cords then ejected out Pharyngeal- Honey Cup Pharyngeal residue - valleculae;Pharyngeal residue - pyriform;Reduced epiglottic inversion;Reduced laryngeal elevation Pharyngeal -- Pharyngeal- Nectar Teaspoon -- Pharyngeal -- Pharyngeal- Nectar Cup Penetration/Aspiration during swallow;Pharyngeal residue - pyriform;Pharyngeal residue - valleculae;Reduced epiglottic  inversion;Reduced laryngeal elevation Pharyngeal Material enters airway, passes BELOW cords then ejected out;Material enters airway, remains ABOVE vocal cords and not ejected out Pharyngeal- Nectar Straw -- Pharyngeal -- Pharyngeal- Thin Teaspoon --  Pharyngeal -- Pharyngeal- Thin Cup -- Pharyngeal -- Pharyngeal- Thin Straw -- Pharyngeal -- Pharyngeal- Puree Pharyngeal residue - valleculae;Reduced tongue base retraction Pharyngeal -- Pharyngeal- Mechanical Soft -- Pharyngeal -- Pharyngeal- Regular -- Pharyngeal -- Pharyngeal- Multi-consistency -- Pharyngeal -- Pharyngeal- Pill -- Pharyngeal -- Pharyngeal Comment --  CHL IP CERVICAL ESOPHAGEAL PHASE 10/24/2016 Cervical Esophageal Phase WFL Pudding Teaspoon -- Pudding Cup -- Honey Teaspoon -- Honey Cup -- Nectar Teaspoon -- Nectar Cup -- Nectar Straw -- Thin Teaspoon -- Thin Cup -- Thin Straw -- Puree -- Mechanical Soft -- Regular -- Multi-consistency -- Pill -- Cervical Esophageal Comment -- No flowsheet data found. [place] Siren 10/24/2016, 12:24 PM Orbie Pyo Colvin Caroli.Ed CCC-SLP Pager [phone]                Results/Tests Pending at Time of Discharge: None  Discharge Medications:  Allergies as of 10/27/2016   No Known Allergies     Medication List    STOP taking these medications   simvastatin 40 MG tablet Commonly known as:  ZOCOR     TAKE these medications   aspirin EC 81 MG tablet Take 1 tablet (81 mg total) by mouth daily.   clopidogrel 75 MG tablet Commonly known as:  PLAVIX Take 1 tablet (75 mg total) by mouth daily.   folic acid 1 MG tablet Commonly known as:  FOLVITE Take 1 tablet (1 mg total) by mouth daily.   gabapentin 300 MG capsule Commonly known as:  NEURONTIN Take 2 capsules (600 mg total) by mouth 2 (two) times daily. What changed:  how much to take  when to take this   glipiZIDE 5 MG tablet Commonly known as:  GLUCOTROL Take 1 tablet (5 mg total) by mouth 2 (two) times daily before a meal. What changed:  when to take this   lisinopril 10 MG tablet Commonly known as:  PRINIVIL,ZESTRIL Take 1 tablet (10 mg total) by mouth daily. What changed:  medication strength  how much to take   metFORMIN 1000 MG tablet Commonly known as:   GLUCOPHAGE Take 1 tablet (1,000 mg total) by mouth 2 (two) times daily with a meal. What changed:  when to take this   pantoprazole 40 MG tablet Commonly known as:  PROTONIX Take 1 tablet (40 mg total) by mouth daily at 6 (six) AM.   rosuvastatin 40 MG tablet Commonly known as:  CRESTOR Take 1 tablet (40 mg total) by mouth daily at 6 PM.   thiamine 100 MG tablet Take 1 tablet (100 mg total) by mouth daily.       Discharge Instructions: Please refer to Patient Instructions section of EMR for full details.  Patient was counseled important signs and symptoms that should prompt return to medical care, changes in medications, dietary instructions, activity restrictions, and follow up appointments.   Follow-Up Appointments: Follow-up Information    Dennie Bible, NP. Schedule an appointment as soon as possible for a visit in 6 week(s).   Specialty:  Family Medicine Contact information: [address] [place] [address] [phone]           Mercy Riding, MD 10/27/2016, 1:39 PM PGY-3, [place]

## 2016-10-26 DIAGNOSIS — I6330 Cerebral infarction due to thrombosis of unspecified cerebral artery: Secondary | ICD-10-CM

## 2016-10-26 LAB — GLUCOSE, CAPILLARY
GLUCOSE-CAPILLARY: 98 mg/dL (ref 65–99)
GLUCOSE-CAPILLARY: 109 mg/dL — AB (ref 65–99)
GLUCOSE-CAPILLARY: 136 mg/dL — AB (ref 65–99)
GLUCOSE-CAPILLARY: 152 mg/dL — AB (ref 65–99)
Glucose-Capillary: 106 mg/dL — ABNORMAL HIGH (ref 65–99)
Glucose-Capillary: 157 mg/dL — ABNORMAL HIGH (ref 65–99)

## 2016-10-26 MED ORDER — GABAPENTIN 300 MG PO CAPS
300.0000 mg | ORAL_CAPSULE | Freq: Every day | ORAL | Status: DC
  Administered 2016-10-27: 300 mg via ORAL
  Filled 2016-10-26: qty 1

## 2016-10-26 NOTE — Progress Notes (Signed)
Patient complaining of right groin pain. Family medicine group notified and called back. Aware. No new verbal orders at this time

## 2016-10-26 NOTE — Care Management Note (Addendum)
Case Management Note  Patient Details  Name: Eric Morton MRN: [id number] Date of Birth: 1965/08/28  Subjective/Objective:                   Presents with right hemiparesis and dysarthria, hx of polysubstance abuse, CKD, HTN, and prior CVA in 2017.      9/27 MRI of the brain revealed numerous infarct including a large 18x73mm left basal ganglia infarct .  Eric Morton        PCP: Arnoldo Morale [facility])  Action/Plan: Per PT/OT's evaluation/recommendation: CIR.... CM to f/u with d/c needs.  Expected Discharge Date:                  Expected Discharge Plan:  [place]  In-House Referral:     Discharge planning Services  CM Consult  Post Acute Care Choice:    Choice offered to:     DME Arranged:    DME Agency:     HH Arranged:    [place] Agency:     Status of Service:  In process, will continue to follow  If discussed at Long Length of Stay Meetings, dates discussed:    Additional Comments:  Sharin Mons, RN 10/26/2016, 5:56 PM

## 2016-10-26 NOTE — Progress Notes (Signed)
Pt resting in bed quietly. Easily aroused. Requests something to help him sleep and minor abdominal discomfort. Pain management administered and effective. Then at 0300 staff nurse is called into room by family member because pt became agitated and appeared to awaken from a bad dream. Pt was noted to yell at family member while dangling on the side of bed. Then he was noted to request to lay in bed upside down which was helpful and he was able to quiet down and go back to sleep. No other concerns thereafter. Upon assessment, pt appears to be afraid of something but refused when asked. callbell within reach. reassurance given to pt and family.safety maintained. Will continue to monitor.

## 2016-10-27 ENCOUNTER — Encounter (HOSPITAL_COMMUNITY): Payer: Self-pay | Admitting: *Deleted

## 2016-10-27 ENCOUNTER — Inpatient Hospital Stay (HOSPITAL_COMMUNITY)
Admission: RE | Admit: 2016-10-27 | Discharge: 2016-11-29 | DRG: 057 | Disposition: A | Discharge status: Home / Self Care | Payer: Medicaid Other | Source: Intra-hospital | Attending: Physical Medicine & Rehabilitation | Admitting: Physical Medicine & Rehabilitation

## 2016-10-27 DIAGNOSIS — E1169 Type 2 diabetes mellitus with other specified complication: Secondary | ICD-10-CM | POA: Diagnosis present

## 2016-10-27 DIAGNOSIS — Z8711 Personal history of peptic ulcer disease: Secondary | ICD-10-CM | POA: Diagnosis not present

## 2016-10-27 DIAGNOSIS — E7800 Pure hypercholesterolemia, unspecified: Secondary | ICD-10-CM | POA: Diagnosis present

## 2016-10-27 DIAGNOSIS — Z7984 Long term (current) use of oral hypoglycemic drugs: Secondary | ICD-10-CM

## 2016-10-27 DIAGNOSIS — N189 Chronic kidney disease, unspecified: Secondary | ICD-10-CM | POA: Diagnosis present

## 2016-10-27 DIAGNOSIS — I129 Hypertensive chronic kidney disease with stage 1 through stage 4 chronic kidney disease, or unspecified chronic kidney disease: Secondary | ICD-10-CM | POA: Diagnosis present

## 2016-10-27 DIAGNOSIS — I69351 Hemiplegia and hemiparesis following cerebral infarction affecting right dominant side: Secondary | ICD-10-CM | POA: Diagnosis present

## 2016-10-27 DIAGNOSIS — Z7982 Long term (current) use of aspirin: Secondary | ICD-10-CM

## 2016-10-27 DIAGNOSIS — Z79899 Other long term (current) drug therapy: Secondary | ICD-10-CM

## 2016-10-27 DIAGNOSIS — E1142 Type 2 diabetes mellitus with diabetic polyneuropathy: Secondary | ICD-10-CM

## 2016-10-27 DIAGNOSIS — I6502 Occlusion and stenosis of left vertebral artery: Secondary | ICD-10-CM | POA: Diagnosis present

## 2016-10-27 DIAGNOSIS — Z8249 Family history of ischemic heart disease and other diseases of the circulatory system: Secondary | ICD-10-CM

## 2016-10-27 DIAGNOSIS — F17210 Nicotine dependence, cigarettes, uncomplicated: Secondary | ICD-10-CM | POA: Diagnosis present

## 2016-10-27 DIAGNOSIS — I6930 Unspecified sequelae of cerebral infarction: Secondary | ICD-10-CM

## 2016-10-27 DIAGNOSIS — E785 Hyperlipidemia, unspecified: Secondary | ICD-10-CM

## 2016-10-27 DIAGNOSIS — Z833 Family history of diabetes mellitus: Secondary | ICD-10-CM

## 2016-10-27 DIAGNOSIS — R1310 Dysphagia, unspecified: Secondary | ICD-10-CM | POA: Diagnosis present

## 2016-10-27 DIAGNOSIS — M792 Neuralgia and neuritis, unspecified: Secondary | ICD-10-CM

## 2016-10-27 DIAGNOSIS — D45 Polycythemia vera: Secondary | ICD-10-CM | POA: Diagnosis present

## 2016-10-27 DIAGNOSIS — E119 Type 2 diabetes mellitus without complications: Secondary | ICD-10-CM

## 2016-10-27 DIAGNOSIS — E1122 Type 2 diabetes mellitus with diabetic chronic kidney disease: Secondary | ICD-10-CM | POA: Diagnosis present

## 2016-10-27 DIAGNOSIS — R471 Dysarthria and anarthria: Secondary | ICD-10-CM

## 2016-10-27 DIAGNOSIS — R1312 Dysphagia, oropharyngeal phase: Secondary | ICD-10-CM

## 2016-10-27 DIAGNOSIS — I6310 Cerebral infarction due to embolism of unspecified precerebral artery: Secondary | ICD-10-CM

## 2016-10-27 DIAGNOSIS — I10 Essential (primary) hypertension: Secondary | ICD-10-CM | POA: Diagnosis present

## 2016-10-27 LAB — CREATININE, SERUM
CREATININE: 1.02 mg/dL (ref 0.61–1.24)
GFR calc Af Amer: >60 mL/min (ref >60)

## 2016-10-27 LAB — CBC
HCT: 43.1 % (ref 39.0–52.0)
HEMATOCRIT: 42.7 % (ref 39.0–52.0)
HEMOGLOBIN: 13.9 g/dL (ref 13.0–17.0)
Hemoglobin: 13.9 g/dL (ref 13.0–17.0)
MCH: 28.3 pg (ref 26.0–34.0)
MCH: 28.1 pg (ref 26.0–34.0)
MCHC: 32.6 g/dL (ref 30.0–36.0)
MCHC: 32.3 g/dL (ref 30.0–36.0)
MCV: 86.8 fL (ref 78.0–100.0)
MCV: 87.2 fL (ref 78.0–100.0)
PLATELETS: 144 10*3/uL — AB (ref 150–400)
Platelets: 140 10*3/uL — ABNORMAL LOW (ref 150–400)
RBC: 4.92 MIL/uL (ref 4.22–5.81)
RBC: 4.94 MIL/uL (ref 4.22–5.81)
RDW: 13.3 % (ref 11.5–15.5)
RDW: 13.3 % (ref 11.5–15.5)
WBC: 4.9 10*3/uL (ref 4.0–10.5)
WBC: 4.8 10*3/uL (ref 4.0–10.5)

## 2016-10-27 LAB — BASIC METABOLIC PANEL
ANION GAP: 8 (ref 5–15)
BUN: 11 mg/dL (ref 6–20)
CHLORIDE: 108 mmol/L (ref 101–111)
CO2: 22 mmol/L (ref 22–32)
CREATININE: 0.98 mg/dL (ref 0.61–1.24)
Calcium: 8.7 mg/dL — ABNORMAL LOW (ref 8.9–10.3)
GFR calc non Af Amer: >60 mL/min (ref >60)
GLUCOSE: 106 mg/dL — AB (ref 65–99)
Potassium: 3.6 mmol/L (ref 3.5–5.1)
Sodium: 138 mmol/L (ref 135–145)

## 2016-10-27 LAB — GLUCOSE, CAPILLARY
GLUCOSE-CAPILLARY: 128 mg/dL — AB (ref 65–99)
GLUCOSE-CAPILLARY: 112 mg/dL — AB (ref 65–99)
GLUCOSE-CAPILLARY: 108 mg/dL — AB (ref 65–99)
GLUCOSE-CAPILLARY: 135 mg/dL — AB (ref 65–99)
Glucose-Capillary: 108 mg/dL — ABNORMAL HIGH (ref 65–99)
Glucose-Capillary: 131 mg/dL — ABNORMAL HIGH (ref 65–99)

## 2016-10-27 MED ORDER — ONDANSETRON HCL 4 MG/2ML IJ SOLN: 4.0000 mg | Freq: Four times a day (QID) | INTRAMUSCULAR | Status: DC | PRN

## 2016-10-27 MED ORDER — CLOPIDOGREL BISULFATE 75 MG PO TABS
75.0000 mg | ORAL_TABLET | Freq: Every day | ORAL | Status: DC
  Administered 2016-10-28 – 2016-11-29 (×33): 75 mg via ORAL
  Filled 2016-10-27 (×33): qty 1

## 2016-10-27 MED ORDER — INSULIN ASPART 100 UNIT/ML ~~LOC~~ SOLN: 0.0000 [IU] | SUBCUTANEOUS | Status: DC

## 2016-10-27 MED ORDER — SODIUM CHLORIDE 0.45 % IV SOLN
INTRAVENOUS | Status: DC
  Administered 2016-10-27 – 2016-11-11 (×14): via INTRAVENOUS

## 2016-10-27 MED ORDER — ADULT MULTIVITAMIN W/MINERALS CH
1.0000 | ORAL_TABLET | Freq: Every day | ORAL | Status: DC
  Administered 2016-10-28 – 2016-11-29 (×33): 1 via ORAL
  Filled 2016-10-27 (×32): qty 1

## 2016-10-27 MED ORDER — ASPIRIN 325 MG PO TABS: 325.0000 mg | ORAL_TABLET | Freq: Every day | ORAL | Status: DC

## 2016-10-27 MED ORDER — ENOXAPARIN SODIUM 40 MG/0.4ML ~~LOC~~ SOLN: 40.0000 mg | SUBCUTANEOUS | Status: DC

## 2016-10-27 MED ORDER — ROSUVASTATIN CALCIUM 40 MG PO TABS: 40.0000 mg | ORAL_TABLET | Freq: Every day | ORAL | 2 refills | Status: DC

## 2016-10-27 MED ORDER — ACETAMINOPHEN 160 MG/5ML PO SOLN: 650.0000 mg | ORAL | Status: DC | PRN

## 2016-10-27 MED ORDER — MAGIC MOUTHWASH W/LIDOCAINE
5.0000 mL | Freq: Three times a day (TID) | ORAL | Status: DC | PRN
  Administered 2016-10-27 – 2016-10-28 (×2): 5 mL via ORAL
  Filled 2016-10-27 (×2): qty 5

## 2016-10-27 MED ORDER — GABAPENTIN 300 MG PO CAPS
300.0000 mg | ORAL_CAPSULE | Freq: Every day | ORAL | Status: DC
  Administered 2016-10-27 – 2016-11-28 (×32): 300 mg via ORAL
  Filled 2016-10-27 (×33): qty 1

## 2016-10-27 MED ORDER — THIAMINE HCL 100 MG/ML IJ SOLN
100.0000 mg | Freq: Every day | INTRAMUSCULAR | Status: DC
  Administered 2016-11-01: 100 mg via INTRAVENOUS
  Filled 2016-10-27 (×9): qty 1

## 2016-10-27 MED ORDER — ACETAMINOPHEN 650 MG RE SUPP: 650.0000 mg | RECTAL | Status: DC | PRN

## 2016-10-27 MED ORDER — ROSUVASTATIN CALCIUM 40 MG PO TABS
40.0000 mg | ORAL_TABLET | Freq: Every day | ORAL | Status: DC
  Administered 2016-10-27 – 2016-11-28 (×33): 40 mg via ORAL
  Filled 2016-10-27 (×33): qty 1

## 2016-10-27 MED ORDER — SORBITOL 70 % SOLN
30.0000 mL | Freq: Every day | Status: DC | PRN
  Administered 2016-11-02 – 2016-11-06 (×2): 30 mL via ORAL
  Filled 2016-10-27 (×3): qty 30

## 2016-10-27 MED ORDER — ENOXAPARIN SODIUM 40 MG/0.4ML ~~LOC~~ SOLN
40.0000 mg | SUBCUTANEOUS | Status: DC
  Administered 2016-10-28 – 2016-11-29 (×33): 40 mg via SUBCUTANEOUS
  Filled 2016-10-27 (×33): qty 0.4

## 2016-10-27 MED ORDER — ASPIRIN EC 81 MG PO TBEC: 81.0000 mg | DELAYED_RELEASE_TABLET | Freq: Every day | ORAL | Status: DC

## 2016-10-27 MED ORDER — LISINOPRIL 10 MG PO TABS: 10.0000 mg | ORAL_TABLET | Freq: Every day | ORAL | Status: DC

## 2016-10-27 MED ORDER — RESOURCE THICKENUP CLEAR PO POWD
ORAL | Status: DC | PRN
  Filled 2016-10-27: qty 125

## 2016-10-27 MED ORDER — ONDANSETRON HCL 4 MG PO TABS: 4.0000 mg | ORAL_TABLET | Freq: Four times a day (QID) | ORAL | Status: DC | PRN

## 2016-10-27 MED ORDER — ACETAMINOPHEN 325 MG PO TABS
650.0000 mg | ORAL_TABLET | ORAL | Status: DC | PRN
  Administered 2016-10-28 – 2016-10-31 (×3): 650 mg via ORAL
  Filled 2016-10-27 (×4): qty 2

## 2016-10-27 MED ORDER — CLOPIDOGREL BISULFATE 75 MG PO TABS: 75.0000 mg | ORAL_TABLET | Freq: Every day | ORAL | 2 refills | Status: DC

## 2016-10-27 MED ORDER — FOLIC ACID 1 MG PO TABS
1.0000 mg | ORAL_TABLET | Freq: Every day | ORAL | Status: DC
  Administered 2016-10-28 – 2016-11-29 (×33): 1 mg via ORAL
  Filled 2016-10-27 (×34): qty 1

## 2016-10-27 MED ORDER — LISINOPRIL 10 MG PO TABS: 10.0000 mg | ORAL_TABLET | Freq: Every day | ORAL | 2 refills | Status: DC

## 2016-10-27 MED ORDER — VITAMIN B-1 100 MG PO TABS
100.0000 mg | ORAL_TABLET | Freq: Every day | ORAL | Status: DC
  Administered 2016-10-28 – 2016-11-29 (×32): 100 mg via ORAL
  Filled 2016-10-27 (×32): qty 1

## 2016-10-27 MED ORDER — INSULIN ASPART 100 UNIT/ML ~~LOC~~ SOLN
0.0000 [IU] | Freq: Three times a day (TID) | SUBCUTANEOUS | Status: DC
  Administered 2016-10-27: 1 [IU] via SUBCUTANEOUS
  Administered 2016-10-28 (×3): 2 [IU] via SUBCUTANEOUS
  Administered 2016-10-29 – 2016-10-31 (×7): 1 [IU] via SUBCUTANEOUS
  Administered 2016-11-01: 2 [IU] via SUBCUTANEOUS
  Administered 2016-11-01 – 2016-11-03 (×4): 1 [IU] via SUBCUTANEOUS
  Administered 2016-11-03 – 2016-11-04 (×3): 2 [IU] via SUBCUTANEOUS
  Administered 2016-11-05 (×2): 1 [IU] via SUBCUTANEOUS
  Administered 2016-11-06: 2 [IU] via SUBCUTANEOUS
  Administered 2016-11-06: 3 [IU] via SUBCUTANEOUS
  Administered 2016-11-06: 1 [IU] via SUBCUTANEOUS
  Administered 2016-11-07: 2 [IU] via SUBCUTANEOUS
  Administered 2016-11-07 – 2016-11-09 (×4): 1 [IU] via SUBCUTANEOUS
  Administered 2016-11-09: 2 [IU] via SUBCUTANEOUS
  Administered 2016-11-09: 1 [IU] via SUBCUTANEOUS
  Administered 2016-11-10 – 2016-11-12 (×5): 2 [IU] via SUBCUTANEOUS
  Administered 2016-11-13 (×2): 1 [IU] via SUBCUTANEOUS
  Administered 2016-11-14 (×2): 3 [IU] via SUBCUTANEOUS
  Administered 2016-11-14 – 2016-11-15 (×2): 1 [IU] via SUBCUTANEOUS
  Administered 2016-11-15 – 2016-11-16 (×2): 5 [IU] via SUBCUTANEOUS
  Administered 2016-11-16: 3 [IU] via SUBCUTANEOUS
  Administered 2016-11-17: 1 [IU] via SUBCUTANEOUS
  Administered 2016-11-17 (×2): 2 [IU] via SUBCUTANEOUS
  Administered 2016-11-18: 1 [IU] via SUBCUTANEOUS
  Administered 2016-11-18 – 2016-11-19 (×2): 2 [IU] via SUBCUTANEOUS
  Administered 2016-11-19: 3 [IU] via SUBCUTANEOUS
  Administered 2016-11-20 (×2): 2 [IU] via SUBCUTANEOUS
  Administered 2016-11-20: 1 [IU] via SUBCUTANEOUS
  Administered 2016-11-21: 7 [IU] via SUBCUTANEOUS
  Administered 2016-11-21: 5 [IU] via SUBCUTANEOUS
  Administered 2016-11-22 (×2): 2 [IU] via SUBCUTANEOUS
  Administered 2016-11-22: 3 [IU] via SUBCUTANEOUS
  Administered 2016-11-23: 2 [IU] via SUBCUTANEOUS
  Administered 2016-11-23 – 2016-11-24 (×2): 3 [IU] via SUBCUTANEOUS
  Administered 2016-11-24: 2 [IU] via SUBCUTANEOUS
  Administered 2016-11-24: 5 [IU] via SUBCUTANEOUS
  Administered 2016-11-25: 2 [IU] via SUBCUTANEOUS
  Administered 2016-11-25: 5 [IU] via SUBCUTANEOUS
  Administered 2016-11-25: 7 [IU] via SUBCUTANEOUS
  Administered 2016-11-26 (×2): 3 [IU] via SUBCUTANEOUS
  Administered 2016-11-26: 2 [IU] via SUBCUTANEOUS
  Administered 2016-11-27: 5 [IU] via SUBCUTANEOUS
  Administered 2016-11-27: 3 [IU] via SUBCUTANEOUS
  Administered 2016-11-28 (×2): 2 [IU] via SUBCUTANEOUS
  Administered 2016-11-29: 3 [IU] via SUBCUTANEOUS

## 2016-10-27 NOTE — Progress Notes (Signed)
Pt being DC from [facility] [place] and going to CIR, [place] room 24. Report given to receiving RN. IV in place. Tele DC. All belongings taken with Pt and transported in wheelchair by staff.

## 2016-10-27 NOTE — Progress Notes (Signed)
Family Medicine Teaching Service Daily Progress Note Intern Pager: [phone]  Patient name: Eric Morton Medical record number: [id number] Date of birth: 18-Nov-1965 Age: 51 y.o. Gender: male  Primary Care Provider: Arnoldo Morale, MD Consultants: neuro, PM&R Code Status: Full  Pt Overview and Major Events to Date:  Patient was admitted to [facility] on 9/27 for weakness and aphasia. Code stroke was called while in the ED. Patient was out of tPA treatment window on admission. The patient had a CT negative for acute stroke, and MRI which revealed embolic etiology. However, his echo on 9/29 showed no cardiac source of emboli and LE doppler US negaive. Patient was started on CIWA protocol d/t hx of ETOH abuse. An NG tube was placed to assist with medication administration as patient was unable to swallow. On 9/28 his NG tube was removed and he was slowly transitioned to dysphagia 1 diet. Patient was evaluated by PT/OT/SLP and PM&R, who feel he is appropriate candidate for CIR when stable. Neurology state patient is stable for discharge to CIR and his chronic medical conditions are also stable per FPTS. Await discharge to CIR.   Assessment and Plan: 51 y.o. Male presenting with right sided weakness and aphasia. PMH is significant for CKD, HTN, polycythemia vera, DM2, polysubstance abuse, and HLD.   Right hemiplegia/dysarthria/drooling: due to CVA. CT head with advanced small vessel disease. CTA head/neck with widespread severe intracranial vascular disease and atherosclerosis. MRI head with mutifocal bilateral infarcts largest at L BG, suggestive of embolic etiology. TTE without thrombosis. Stroke likely due to cocaine use and advanced atherosclerosis as seen on CTA head and neck. Neuro didn't think TEE is necessary but suggested 30 day monitor. - Neuro recommend pt d/c with a 30d cardiac event monitor to r/o afib. - Continue DAPT for three months followed by ASA 81 mg.  - Crestor 80 mg daily - Normalize BP.  Increase lisinopril to 10 mg daily - PT/OT/SLP  -CIR on discharge   HLD: Patient not compliant with home medications. Abnormal triglycerides 161 on lipid panel. All other values WNL. Reports atorvastatin allergy.   - Continue rosuvastatin - If allergic rxn, consider switching   HTN: Patient not compliant with home medication, Lisinopril 20mg  daily. BP 212/141 on admission; 183/100 and 144/93 (10/1).  - Consider increasing back to home dose of lisinopril 20mg .   - Monitor BP closely   - IV hydral PRN BP >220/120 - According to JNC 8, BP Goal is <140/90  DM: A1c (9/27) 8.9. CBG 106 10/1.  - Sensitive SSI  - CBG qACHS   ETOH abuse: Patient notes history of withdrawals in past but denies history of seizure. Serum EtOH 10mg /dL. Last drink 9/26. CIWA scores were 6 (9/30).  - CIWA protocol  - Thiamine, folate  Polycythemia  Hct remains at goal at 42.7 (10/1), goal is <45.   - Daily CBC -CBC WNL today (10/1)  Neuropathy:  - Holding home gabapentin 300mg  BID   Possible OSA: - Outpatient sleep study recommended per neuro  FEN/GI: Dysphagia 1 diet PPx: SCDs  Disposition: CIR available today 10/1  Subjective:  51 y.o.male who presented 9/27 for evaluation of right sided weakness and aphasia. Patient uses letter cards to spell out words or gestures to states wishes. Today, the patient reports continued right sided weakness in his arm, but improved strength in his right leg. The patient notes feeling "butterflies" in his stomach which he believes are from his statin medication. Patient also reports 3/10 right groin pain that  started 2 days ago and right upper gum pain from his teeth rubbing against his gums. Patient denies muscle aches, muscle soreness, headache, abdominal pain, chest pain, difficulty swallowing or any other symptom.   Objective: Temp:  [97.6 F (36.4 C)-98 F (36.7 C)] 98 F (36.7 C) (10/01 0504) Pulse Rate:  [72-82] 82 (10/01 0504) Resp:  [18-20] 18  (10/01 0504) BP: (144-164)/(76-107) 144/93 (10/01 0504) SpO2:  [95 %-100 %] 98 % (10/01 0504)  Physical Exam: General: Alert and able to respond to questions appropriately. No acute distress. Mouth: Small erythematous ulcer on buccal side, inside upper right lip.  Cardiovascular: Regular rate and rhythm.  Respiratory: CTA bilaterally, no wheezing. Good effort. On RA.  Abdomen: Soft, non-tender. BS are present.   Extremities: 3/5 strength in right UE. 5/5 strength in RLE. 5/5 strength in UE and LE on left.  Neuro: Awake and alert, able to follow commands and indicate wishes with cards or gestures. Sensation intact bilaterally. Motor 3/5 in RUE, 4/5 in RLE and 5/5 in LEs. CN II-XII intact except for dysarthria, dysphagia, drooping and mild right facial drooping.   Laboratory:  Recent Labs Lab 10/24/16 0700 10/25/16 0428 10/27/16 0712  WBC 6.5 5.3 4.9  HGB 14.2 13.8 13.9  HCT 42.9 42.7 42.7  PLT 138* 135* 140*    Recent Labs Lab 10/23/16 0906  10/24/16 0700 10/25/16 0428 10/27/16 0712  NA 136  < > 138 138 138  K 4.0  < > 3.5 3.3* 3.6  CL 102  < > 104 106 108  CO2 24  --  26 24 22   BUN 5*  < > 9 10 11   CREATININE 1.02  < > 1.08 1.03 0.98  CALCIUM 9.2  --  8.9 8.5* 8.7*  PROT 7.3  --   --   --   --   BILITOT 0.8  --   --   --   --   ALKPHOS 75  --   --   --   --   ALT 17  --   --   --   --   AST 24  --   --   --   --   GLUCOSE 189*  < > 130* 129* 106*  < > = values in this interval not displayed.   Imaging/Diagnostic Tests: No results found.   Alisa Graff, Medical Student 10/27/2016, 12:04 PM [place] Intern pager: [phone], text pages welcome  I have seen and evaluated the patient with Student Dr. Retia Passe. I am in agreement with the note above in its revised form. My additions are in red. See the discharge summary for more.  Wendee Beavers, MD, PGY-2 10/27/2016 8:17 PM

## 2016-10-27 NOTE — PMR Pre-admission (Signed)
PMR Admission Coordinator Pre-Admission Assessment  Patient: Eric Morton is an 51 y.o., male MRN: [id number] DOB: Jul 30, 1965 Height: 5\' 7"  (170.2 cm) Weight: 74.1 kg (163 lb 4.8 oz)              Insurance Information  PRIMARY: uninsured  Medicaid Application Date:       Case Manager:  Disability Application Date:       Case Worker:  I left a message for financial counselor, Abelina Bachelor at [phone] to request medicaid and disability. Pt's [name] states pt has previously been denied disability and she would like to discuss reapplying with financial counselor.  Emergency Contact Information Contact Information    Name Relation Home Work Mobile   Shoffner,Shirley Cathay [phone]       Current Medical History  Patient Admitting Diagnosis: embolic cerebral infarcts, with most significant insult in the left basal ganglia with resulting right hemiparesis and severe dysarthria/dysphagia  History of Present Illness: a 51 y.o.right handedmalewith history of polysubstance abuse,documented CKD with creatinine 1.02, HTN,polycythemia vera, diabetes mellitus and prior CVA in 2017 and right side residual deficits.  Presented on 10/23/16 with right hemiparesis and dysarthria. Work up revealed UDS + for cocaine. BP elevated into the 200'/100's requiring IV labetalol. CT cerebral perfusion scan did not detect for infarction. CT angiogram head and neck negative for emergent large vessel occlusion. No significant carotid or right vertebral artery stenosis in the neck despite atherosclerosis. 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 MRI of the brain revealed numerous infarct including a large 18x35mm left basal ganglia infarct suggestive of an embolic etiology. Echocardiogram with ejection fraction of 22% grade 1 diastolic dysfunction. Bilateral lower extremity Dopplers negative. Pt on D1/honey thick liquid dit currently.  Currently maintained on aspirin and Plavix for  CVA prophylaxis. subcutaneous Lovenox for DVT prophylaxis. Hemoglobin A1c of 8.9.  Total: 9 NIHSS    Past Medical History  Past Medical History:  Diagnosis Date  . 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 Anxiety   . Chronic kidney disease    "jsut a little damage"  . Glaucoma   . History of stomach ulcers    not confirmed, never underwent diagnostic endo or radiology, this manifested as epigastric distress.   [name] Hx of adenomatous colonic polyps   . Hypercholesterolemia   . Hypertension   . Peripheral neuropathy   . Polycythemia vera ([place]) 2006  . Substance abuse   . Type II diabetes mellitus (HCC)     Family History  family history includes Diabetes in his mother; Hypertension in his mother.  Prior Rehab/Hospitalizations:  Has the patient had major surgery during 100 days prior to admission? No  Current Medications   Current Facility-Administered Medications:  .   stroke: mapping our early stages of recovery book, , Does not apply, Once, Smiley Houseman, MD .  acetaminophen (TYLENOL) tablet 650 mg, 650 mg, Oral, Q4H PRN, 650 mg at 10/26/16 2118 **OR** acetaminophen (TYLENOL) solution 650 mg, 650 mg, Per Tube, Q4H PRN **OR** acetaminophen (TYLENOL) suppository 650 mg, 650 mg, Rectal, Q4H PRN, Smiley Houseman, MD .  Derrill Memo ON 10/28/2016] aspirin tablet 325 mg, 325 mg, Oral, Daily, Gonfa, Taye T, MD .  clopidogrel (PLAVIX) tablet 75 mg, 75 mg, Oral, Daily, Rosalin Hawking, MD, 75 mg at 10/27/16 0956 .  enoxaparin (LOVENOX) injection 40 mg, 40 mg, Subcutaneous, Q24H, Chambliss, Jeb Levering, MD, 40 mg at 10/27/16  4580 .  folic acid (FOLVITE) tablet 1 mg, 1 mg, Oral, Daily, Smiley Houseman, MD, 1 mg at 10/27/16 0955 .  gabapentin (NEURONTIN) capsule 300 mg, 300 mg, Oral, QHS, Rumball, Alison, DO, 300 mg at 10/27/16 0203 .  hydrALAZINE (APRESOLINE) injection 5-10 mg, 5-10 mg, Intravenous, Q2H PRN, Rosalin Hawking, MD .  insulin aspart (novoLOG) injection 0-9  Units, 0-9 Units, Subcutaneous, Q4H, Smiley Houseman, MD, 1 Units at 10/27/16 0209 .  [START ON 10/28/2016] lisinopril (PRINIVIL,ZESTRIL) tablet 10 mg, 10 mg, Oral, Daily, Diallo, Abdoulaye, MD .  multivitamin with minerals tablet 1 tablet, 1 tablet, Oral, Daily, Smiley Houseman, MD, 1 tablet at 10/27/16 0955 .  RESOURCE THICKENUP CLEAR, , Oral, PRN, Chambliss, Marshall L, MD .  rosuvastatin (CRESTOR) tablet 40 mg, 40 mg, Oral, q1800, Lockamy, Timothy, DO, 40 mg at 10/26/16 1817 .  thiamine (VITAMIN B-1) tablet 100 mg, 100 mg, Oral, Daily, 100 mg at 10/27/16 0955 **OR** thiamine (B-1) injection 100 mg, 100 mg, Intravenous, Daily, Dallas Schimke, Almond Lint, MD  Patients Current Diet: DIET - DYS 1 Room service appropriate? Yes; Fluid consistency: Honey Thick Diet - low sodium heart healthy  Precautions / Restrictions Precautions Precautions: Fall Restrictions Weight Bearing Restrictions: No   Has the patient had 2 or more falls or a fall with injury in the past year?No  Prior Activity Level Community (5-7x/wk): Independent without AD pta; does not drive for does not have a car. Pt "disabled" since his previous CVA. Aunt states he watches TV a lot, smokes and drinks. Hangs out with his cousin, [name]. Has girlfriend , Hulda Marin. Lives alone in secondary house to Aunt's home.   Home Assistive Devices / Equipment Home Equipment: None  Prior Device Use: Indicate devices/aids used by the patient prior to current illness, exacerbation or injury? None of the above  Prior Functional Level Prior Function Level of Independence: Independent Comments: H/o CVA  Self Care: Did the patient need help bathing, dressing, using the toilet or eating?  Independent  Indoor Mobility: Did the patient need assistance with walking from room to room (with or without device)? Independent  Stairs: Did the patient need assistance with internal or external stairs (with or without device)?  Independent  Functional Cognition: Did the patient need help planning regular tasks such as shopping or remembering to take medications? Independent  Current Functional Level Cognition  Arousal/Alertness: Awake/alert Overall Cognitive Status: Difficult to assess Difficult to assess due to: Impaired communication Orientation Level: Oriented X4 Safety/Judgement: Decreased awareness of safety, Decreased awareness of deficits General Comments: Responds well to verbal cues and was very receptive of safety instructions once educated. Attention: Selective Selective Attention: Appears intact Memory: Appears intact Awareness: Appears intact Behaviors: Poor frustration tolerance Safety/Judgment: Appears intact    Extremity Assessment (includes Sensation/Coordination)  Upper Extremity Assessment: Defer to OT evaluation RUE Deficits / Details: able to move whole arm with increased time and effort (both FM and GM involvement) RUE Coordination: decreased fine motor, decreased gross motor  Lower Extremity Assessment: RLE deficits/detail RLE Deficits / Details: Excellent strength throughout BIL LEs grossly with muscle testing however demonstrates notable ataxia with functional tasks including sit<>stand and gait.  RLE Sensation: decreased proprioception RLE Coordination: decreased gross motor    ADLs  Overall ADL's : Needs assistance/impaired Eating/Feeding: Set up, Supervision/ safety Eating/Feeding Details (indicate cue type and reason): full S for meals due to impulsivity per SLP and pocketing Grooming: Moderate assistance, Sitting Upper Body Bathing: Moderate assistance, Sitting Lower Body Bathing:  Maximal assistance Lower Body Bathing Details (indicate cue type and reason): MOd A sit<>stand, Max A to maintain standing Upper Body Dressing : Moderate assistance, Sitting Lower Body Dressing: Moderate assistance Lower Body Dressing Details (indicate cue type and reason): MOd A sit<>stand, Max  A to maintain standing Toilet Transfer: Maximal assistance, Stand-pivot Toilet Transfer Details (indicate cue type and reason): blocking right knee Toileting- Clothing Manipulation and Hygiene: Maximal assistance Toileting - Clothing Manipulation Details (indicate cue type and reason): MOd A sit<>stand, Max A to maintain standing    Mobility  Overal bed mobility: Needs Assistance Bed Mobility: Supine to Sit, Sit to Supine Supine to sit: Min assist Sit to supine: Min guard General bed mobility comments: Min assist for truncal support and balance while rising to EOB for safety as well. Min guard with return to bed, using VC for technique.    Transfers  Overall transfer level: Needs assistance Equipment used: Rolling walker (2 wheeled) Transfers: Sit to/from Stand Sit to Stand: Mod assist Stand pivot transfers: Max assist General transfer comment: Mod assist for balance with rising from bed x5 and chair with arm rests x2. Tactile cues for UE placement. Verbal cues for hand placement and safety. Progressed to a min assist level from bed.    Ambulation / Gait / Stairs / Wheelchair Mobility  Ambulation/Gait Ambulation/Gait assistance: Mod assist Ambulation Distance (Feet): 15 Feet (x4) Assistive device: Rolling walker (2 wheeled) Gait Pattern/deviations: Step-to pattern, Decreased stride length, Decreased stance time - right, Decreased dorsiflexion - right, Ataxic, Drifts right/left, Narrow base of support General Gait Details: Able to ambulate several short distance bouts today with mod assist for balance, demonstrating signficant ataxia, specifically noteable with RLE. Responds well to verbal cues but requires assist with walker navigation, and sequencing of gait including RLE foot placement. Gait velocity: decreased Gait velocity interpretation: <1.8 ft/sec, indicative of risk for recurrent falls    Posture / Balance Dynamic Sitting Balance Sitting balance - Comments: initially requiring  UE support but progressed to unsupported sitting, able to dynamically move LEs and shift weight, difficulty with Rt weight shift and return to midline Balance Overall balance assessment: Needs assistance Sitting-balance support: Feet supported, No upper extremity supported Sitting balance-Leahy Scale: Fair Sitting balance - Comments: initially requiring UE support but progressed to unsupported sitting, able to dynamically move LEs and shift weight, difficulty with Rt weight shift and return to midline Standing balance support: Single extremity supported Standing balance-Leahy Scale: Poor Standing balance comment: Able hold RW with 1 UE for short periods of time statically    Special needs/care consideration BiPAP/CPAP  N/a CPM  N/a Continuous Drip IV n/a Dialysis  N/a Life Vest  N/a Oxygen  N/a Special Bed  N/a Trach Size  N/a Wound Vac n/a Skin intact Bowel mgmt: LBM 9/27, continent Bladder mgmt: continent urinal with mod assist Diabetic mgmt Hgb A1c 8.9 Noncompliant with meds pta History of ETOH, tobacco and drug abuse   Previous Home Environment Living Arrangements:  (lives with [name] at family home)  Lives With: Family ([place] and cousin [name]) Available Help at Discharge: Family, Available 24 hours/day Type of Home:  (lives in "little house" behind [name]'s "big House") Home Layout: One level Home Access: Level entry Bathroom Shower/Tub: Other (comment) (has 1/2 bath in "little house". goes to Aunt's home next doo) Bathroom Toilet: Standard Bathroom Accessibility: Yes How Accessible: Accessible via walker [place]: No Additional Comments: denied disability after past CVA  Discharge Living Setting Plans for  Discharge Living Setting: Lives with (comment) (lives in [place] next to his Aunt's home) Type of Home at Discharge: House Discharge Home Layout: One level Discharge Home Access: Level entry Discharge Bathroom Shower/Tub: Tub/shower  unit, Curtain (pt's house has just 1/2 bath; goes to Aunt's home to shower) Discharge Bathroom Toilet: Standard Discharge Bathroom Accessibility: Yes How Accessible: Accessible via walker Does the patient have any problems obtaining your medications?: Yes (Describe) (uninsured)  Social/Family/Support Systems Patient Roles:  (not married, no children) Contact Information: [name] is his main contact, not his brother or sister Anticipated Caregiver: [name]'s son, [name], will be main caregiver ([name] will not come to hospital due to her cancer) Anticipated Caregiver's Contact Information: see above Ability/Limitations of Caregiver: Elenor Legato has Cancer; [name] unemployed; pt's girlfriend, Synthia Innocent, is involved Caregiver Availability: 24/7 Discharge Plan Discussed with Primary Caregiver: Yes Is Caregiver In Agreement with Plan?: Yes Does Caregiver/Family have Issues with Lodging/Transportation while Pt is in Rehab?: No Hulda Marin, [name] and his sister and brother likely to stay w)  Goals/Additional Needs Patient/Family Goal for Rehab: Mod I to supervision with PT and OT, supervision to min assist with SLP Expected length of stay: [facility] 7- 12 days Equipment Needs: severe communication issues Pt/Family Agrees to Admission and willing to participate: Yes Program Orientation Provided & Reviewed with Pt/Caregiver Including Roles  & Responsibilities: Yes  Barriers to Discharge: Medication compliance  Decrease burden of Care through IP rehab admission: n/s  Possible need for SNF placement upon discharge:not anticipated  Patient Condition: This patient's condition remains as documented in the consult dated 10/25/2016, in which the Rehabilitation Physician determined and documented that the patient's condition is appropriate for intensive rehabilitative care in an inpatient rehabilitation facility. Will admit to inpatient rehab today.  Preadmission Screen Completed By:  Cleatrice Burke, 10/27/2016 12:49 PM ______________________________________________________________________   Discussed status with Dr. Posey Pronto on 10/27/2016 at  1248 and received telephone approval for admission today.  Admission Coordinator:  Cleatrice Burke, time 6063 Date 10/27/2016

## 2016-10-27 NOTE — Progress Notes (Signed)
Patient ID: Eric Morton, male   DOB: 1965-07-09, 51 y.o.   MRN: [id number] Patient arrived from [phone] with belongings and family. Patient oriented to room, nurse call system, rehab process, and rehab schedule. Patient placed in low bed for blood and bones precautions. Telesitter ordered.

## 2016-10-27 NOTE — Progress Notes (Signed)
Meredith Staggers, MD Physician Signed Physical Medicine and Rehabilitation  Consult Note Date of Service: 10/25/2016 10:08 AM  Related encounter: ED to Hosp-Admission (Current) from 10/23/2016 in [facility] All Collapse All   [] Hide copied text [] Hover for attribution information      Physical Medicine and Rehabilitation Consult Reason for Consult:right sided weakness, dysphagia Referring Physician: Erlinda Hong   HPI: Eric Morton is a 51 y.o. male with history of polysubstance abuse, CKD, HTN, and prior CVA in 2017 who presented on 10/23/16 with right hemiparesis and dysarthria. Work up revealed UDS + for cocaine. BP elevated into the 200'/100's requiring IV labetalol. MRI of the brain revealed numerous infarct including a large 18x73mm left basal ganglia infarct suggestive of an embolic etiology. Pt on D1/honey thick liquid dit currently.  PM&R was asked to see pt to assess for inpatient rehab needs.    Review of Systems  Constitutional: Negative for fever.  HENT: Negative for hearing loss.   Eyes: Negative for blurred vision.  Respiratory: Positive for cough.   Cardiovascular: Negative for palpitations.  Gastrointestinal: Negative for abdominal pain.  Genitourinary: Negative for dysuria.  Musculoskeletal: Negative for neck pain.  Skin: Negative for rash.  Neurological: Positive for speech change and focal weakness.  Psychiatric/Behavioral: Positive for substance abuse.       Past Medical History:  Diagnosis Date  . 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 Anxiety   . Chronic kidney disease    "jsut a little damage"  . Glaucoma   . History of stomach ulcers    not confirmed, never underwent diagnostic endo or radiology, this manifested as epigastric distress.   [name] Hx of adenomatous colonic polyps   . Hypercholesterolemia   . Hypertension   . Peripheral neuropathy   . Polycythemia vera ([place]) 2006  .  Substance abuse   . Type II diabetes mellitus ([place])         Past Surgical History:  Procedure Laterality Date  . COLONOSCOPY WITH PROPOFOL N/A 04/28/2014   Procedure: COLONOSCOPY WITH PROPOFOL;  Surgeon: Gatha Mayer, MD;  Location: [facility];  Service: Endoscopy;  Laterality: N/A;  . IRRIGATION AND DEBRIDEMENT ABSCESS  09/26/2011   Procedure: IRRIGATION AND DEBRIDEMENT ABSCESS;  Surgeon: Imogene Burn. Georgette Dover, MD;  Location: [facility];  Service: General;  Laterality: Left;  Incision and Drainage left breast abscess        Family History  Problem Relation Age of Onset  . Diabetes Mother   . Hypertension Mother    Social History:  reports that he has been smoking Cigarettes.  He has a 8.00 pack-year smoking history. He has never used smokeless tobacco. He reports that he drinks alcohol. He reports that he uses drugs, including Marijuana and "Crack" cocaine. Allergies: No Known Allergies       Medications Prior to Admission  Medication Sig Dispense Refill  . aspirin EC 81 MG tablet Take 1 tablet (81 mg total) by mouth daily. 30 tablet 2  . gabapentin (NEURONTIN) 300 MG capsule Take 2 capsules (600 mg total) by mouth 2 (two) times daily. (Patient taking differently: Take 300 mg by mouth daily. ) 120 capsule 5  . glipiZIDE (GLUCOTROL) 5 MG tablet Take 1 tablet (5 mg total) by mouth 2 (two) times daily before a meal. (Patient taking differently: Take 5 mg by mouth daily before breakfast. ) 60 tablet 5  . lisinopril (PRINIVIL,ZESTRIL) 20  MG tablet Take 1 tablet (20 mg total) by mouth daily. 30 tablet 5  . metFORMIN (GLUCOPHAGE) 1000 MG tablet Take 1 tablet (1,000 mg total) by mouth 2 (two) times daily with a meal. (Patient taking differently: Take 1,000 mg by mouth daily with breakfast. ) 60 tablet 5  . simvastatin (ZOCOR) 40 MG tablet Take 1 tablet (40 mg total) by mouth at bedtime. 30 tablet 5  . thiamine 100 MG tablet Take 1 tablet (100 mg total) by mouth daily. 30 tablet 2  . folic acid  (FOLVITE) 1 MG tablet Take 1 tablet (1 mg total) by mouth daily. (Patient not taking: Reported on 10/23/2016) 30 tablet 1  . pantoprazole (PROTONIX) 40 MG tablet Take 1 tablet (40 mg total) by mouth daily at 6 (six) AM. (Patient not taking: Reported on 10/23/2016) 30 tablet 0    Home: Home Living Family/patient expects to be discharged to:: Private residence Living Arrangements: Other relatives Available Help at Discharge: Family, Available 24 hours/day Type of Home: Apartment Home Access: Level entry Home Layout: One level Bathroom Shower/Tub: Chiropodist: Standard Home Equipment: None  Functional History: Prior Function Level of Independence: Independent Functional Status:  Mobility: Bed Mobility Overal bed mobility: Needs Assistance Bed Mobility: Supine to Sit Supine to sit: Min guard, HOB elevated General bed mobility comments: increased time Transfers Overall transfer level: Needs assistance Equipment used:  (standing in front of pt with gait belt and pt holding onto my arms) Transfers: Sit to/from Stand, [employer] Transfers Sit to Stand: Mod assist Stand pivot transfers: Max assist General transfer comment: for stand pivot ( blocking right knee, A'ing with weight shifting to move RLE and A to move RLE  ADL: ADL Overall ADL's : Needs assistance/impaired Eating/Feeding: Set up, Supervision/ safety Eating/Feeding Details (indicate cue type and reason): full S for meals due to impulsivity per SLP and pocketing Grooming: Moderate assistance, Sitting Upper Body Bathing: Moderate assistance, Sitting Lower Body Bathing: Maximal assistance Lower Body Bathing Details (indicate cue type and reason): MOd A sit<>stand, Max A to maintain standing Upper Body Dressing : Moderate assistance, Sitting Lower Body Dressing: Moderate assistance Lower Body Dressing Details (indicate cue type and reason): MOd A sit<>stand, Max A to maintain standing Toilet Transfer:  Maximal assistance, Stand-pivot Toilet Transfer Details (indicate cue type and reason): blocking right knee Toileting- Clothing Manipulation and Hygiene: Maximal assistance Toileting - Clothing Manipulation Details (indicate cue type and reason): MOd A sit<>stand, Max A to maintain standing  Cognition: Cognition Overall Cognitive Status: Impaired/Different from baseline Orientation Level: Oriented X4 Cognition Arousal/Alertness: Awake/alert Behavior During Therapy: Impulsive Overall Cognitive Status: Impaired/Different from baseline Area of Impairment: Safety/judgement, Problem solving Safety/Judgement: Decreased awareness of safety, Decreased awareness of deficits Problem Solving: Requires verbal cues  Blood pressure (!) 178/86, pulse 82, temperature 98.2 F (36.8 C), temperature source Oral, resp. rate 20, height 5\' 7"  (1.702 m), weight 74.1 kg (163 lb 4.8 oz), SpO2 100 %. Physical Exam  Constitutional: No distress.  HENT:  Head: Normocephalic.  Eyes: Pupils are equal, round, and reactive to light.  Neck: Normal range of motion.  Cardiovascular: Normal rate.   Respiratory: Effort normal.  GI: He exhibits no distension.  Musculoskeletal: He exhibits no edema.  Neurological:  Pt sitting EOB trying to eat breakfast. Significant right central 7 and tongue deviation. Severely dysarthric. RUE grossly 2+ to 3/5. RLE 3/5. Senses LT and pain fairly equally. Follows commands. Answered biographical information.     Lab Results Last 24 Hours  Results for orders placed or performed during the hospital encounter of 10/23/16 (from the past 24 hour(s))  Glucose, capillary     Status: Abnormal   Collection Time: 10/24/16 12:24 PM  Result Value Ref Range   Glucose-Capillary 191 (H) 65 - 99 mg/dL   Comment 1 Notify RN   Glucose, capillary     Status: Abnormal   Collection Time: 10/24/16  4:56 PM  Result Value Ref Range   Glucose-Capillary 163 (H) 65 - 99 mg/dL  Glucose,  capillary     Status: Abnormal   Collection Time: 10/24/16  7:55 PM  Result Value Ref Range   Glucose-Capillary 148 (H) 65 - 99 mg/dL  Glucose, capillary     Status: Abnormal   Collection Time: 10/24/16 10:05 PM  Result Value Ref Range   Glucose-Capillary 150 (H) 65 - 99 mg/dL  Glucose, capillary     Status: Abnormal   Collection Time: 10/25/16 12:11 AM  Result Value Ref Range   Glucose-Capillary 116 (H) 65 - 99 mg/dL  Glucose, capillary     Status: Abnormal   Collection Time: 10/25/16  4:05 AM  Result Value Ref Range   Glucose-Capillary 137 (H) 65 - 99 mg/dL  Basic metabolic panel     Status: Abnormal   Collection Time: 10/25/16  4:28 AM  Result Value Ref Range   Sodium 138 135 - 145 mmol/L   Potassium 3.3 (L) 3.5 - 5.1 mmol/L   Chloride 106 101 - 111 mmol/L   CO2 24 22 - 32 mmol/L   Glucose, Bld 129 (H) 65 - 99 mg/dL   BUN 10 6 - 20 mg/dL   Creatinine, Ser 1.03 0.61 - 1.24 mg/dL   Calcium 8.5 (L) 8.9 - 10.3 mg/dL   GFR calc non Af Amer >60 >60 mL/min   GFR calc Af Amer >60 >60 mL/min   Anion gap 8 5 - 15  CBC     Status: Abnormal   Collection Time: 10/25/16  4:28 AM  Result Value Ref Range   WBC 5.3 4.0 - 10.5 K/uL   RBC 4.91 4.22 - 5.81 MIL/uL   Hemoglobin 13.8 13.0 - 17.0 g/dL   HCT 42.7 39.0 - 52.0 %   MCV 87.0 78.0 - 100.0 fL   MCH 28.1 26.0 - 34.0 pg   MCHC 32.3 30.0 - 36.0 g/dL   RDW 13.3 11.5 - 15.5 %   Platelets 135 (L) 150 - 400 K/uL  Glucose, capillary     Status: Abnormal   Collection Time: 10/25/16  8:02 AM  Result Value Ref Range   Glucose-Capillary 123 (H) 65 - 99 mg/dL      Imaging Results (Last 48 hours)  Ct Angio Head W Or Wo Contrast  Result Date: 10/23/2016 CLINICAL DATA:  51 year old male code stroke. Altered mental status with waxing and waning abnormal speech and extremity weakness in the ED. Last known normal last night. Clinically the patient symptoms implicate left cerebral ischemia. EXAM: CT ANGIOGRAPHY  HEAD AND NECK TECHNIQUE: Multidetector CT imaging of the head and neck was performed using the standard protocol during bolus administration of intravenous contrast. Multiplanar CT image reconstructions and MIPs were obtained to evaluate the vascular anatomy. Carotid stenosis measurements (when applicable) are obtained utilizing NASCET criteria, using the distal internal carotid diameter as the denominator. CONTRAST:  100 mL Isovue 370, in addition to the contrast volume reported on the CT perfusion study today COMPARISON:  CT head without contrast and CT perfusion from today reported  separately. FINDINGS: The initial CTA attempt was noncontrast and therefore nondiagnostic related to IV contrast extravasation. See details on the CT perfusion study today. Repeat CTA was performed at 1113 hours. CTA NECK Skeleton: Intermittent poor dentition. No acute osseous abnormality identified. Chronic right lamina papyracea fracture. Upper chest: Negative visualized lung parenchyma. No superior mediastinal mass or lymphadenopathy. Other neck: Negative.  No cervical lymphadenopathy. Could contrast timing on the repeat CTA images. Aortic arch: 3 vessel arch configuration with minimal arch atherosclerosis. Right carotid system: Negative right CCA. Soft and calcified plaque at the right ICA origin and bulb but no associated stenosis. Left carotid system: Mild soft and calcified plaque at the left CCA origin without stenosis. Calcified plaque at the left ICA origin without stenosis. Vertebral arteries: No proximal right subclavian artery stenosis, minimal plaque. Normal right vertebral artery origin. The right vertebral is patent to the skullbase without stenosis. No proximal left subclavian artery stenosis, minimal soft plaque. The left vertebral artery is occluded at its origin and throughout the neck. CTA HEAD Posterior circulation: The distal right vertebral artery is patent but demonstrates moderate to severe atherosclerosis,  and up to moderate stenosis in the V4 segment. The vertebrobasilar junction is patent. New line the distal left vertebral artery appears occluded to the level of L bulky calcified plaque seen on series 8, image 149. The distal left V4 segment appears remain patent including the left PICA origin, and is supplied in a retrograde fashion from the vertebrobasilar junction. The basilar artery is patent but highly irregular with moderate to severe multifocal distal basilar stenosis. Both PCA P1 segments are patent, although irregular and worse on the right side. Distal right PCA flow is more robust on the left. It is unclear whether the posterior communicating arteries are present. Anterior circulation: Both ICA siphons are patent but heavily calcified. Hemodynamically significant bilateral anterior genu and proximal supraclinoid segment stenosis is suspected. Both carotid termini are patent. The bilateral ACA A1 segments appear patent but diminutive. The anterior communicating artery and bilateral a 2 segments are patent but irregular. The left MCA M1 segment, bifurcation, and proximal left MCA branches are patent but irregular. There is more severe irregularity of the right MCA M1 segment and right MCA M2 branches, which demonstrate attenuated flow when compared to the left side. Venous sinuses: Patent. Anatomic variants: None. Review of the MIP images confirms the above findings IMPRESSION: 1. Negative for emergent large vessel occlusion. 2. 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 This appears stable since the 2017 brain MRI, and there is retrograde reconstitution of the distal left V4 segment and left PICA. 3. No significant carotid or right vertebral artery stenosis in the neck despite atherosclerosis. 4. 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 There are moderate or severe stenoses  of the bilateral ICA siphons, the right vertebral artery V4 segment which supplies the basilar, the basilar artery, and the right MCA M1 and right PCA P1 segments. These findings have progressed since the 2017 MRA. 5. Attenuated flow in the right MCA and right PCA branches, but better maintained flow in the bilateral ACA, left MCA and left PCA branches. 6. This study was reviewed in person with Dr. Erlinda Hong on 10/23/2016 at 1140 hours. Electronically Signed   By: Genevie Ann M.D.   On: 10/23/2016 11:47   Ct Angio Neck W And/or Wo Contrast  Result Date: 10/23/2016 CLINICAL DATA:  50 year old male code stroke. Altered mental status with waxing and waning abnormal speech and extremity weakness in the ED. Last known normal last night. Clinically the patient symptoms implicate left cerebral ischemia. EXAM: CT ANGIOGRAPHY HEAD AND NECK TECHNIQUE: Multidetector CT imaging of the head and neck was performed using the standard protocol during bolus administration of intravenous contrast. Multiplanar CT image reconstructions and MIPs were obtained to evaluate the vascular anatomy. Carotid stenosis measurements (when applicable) are obtained utilizing NASCET criteria, using the distal internal carotid diameter as the denominator. CONTRAST:  100 mL Isovue 370, in addition to the contrast volume reported on the CT perfusion study today COMPARISON:  CT head without contrast and CT perfusion from today reported separately. FINDINGS: The initial CTA attempt was noncontrast and therefore nondiagnostic related to IV contrast extravasation. See details on the CT perfusion study today. Repeat CTA was performed at 1113 hours. CTA NECK Skeleton: Intermittent poor dentition. No acute osseous abnormality identified. Chronic right lamina papyracea fracture. Upper chest: Negative visualized lung parenchyma. No superior mediastinal mass or lymphadenopathy. Other neck: Negative.  No cervical lymphadenopathy. Could contrast timing on the repeat CTA  images. Aortic arch: 3 vessel arch configuration with minimal arch atherosclerosis. Right carotid system: Negative right CCA. Soft and calcified plaque at the right ICA origin and bulb but no associated stenosis. Left carotid system: Mild soft and calcified plaque at the left CCA origin without stenosis. Calcified plaque at the left ICA origin without stenosis. Vertebral arteries: No proximal right subclavian artery stenosis, minimal plaque. Normal right vertebral artery origin. The right vertebral is patent to the skullbase without stenosis. No proximal left subclavian artery stenosis, minimal soft plaque. The left vertebral artery is occluded at its origin and throughout the neck. CTA HEAD Posterior circulation: The distal right vertebral artery is patent but demonstrates moderate to severe atherosclerosis, and up to moderate stenosis in the V4 segment. The vertebrobasilar junction is patent. New line the distal left vertebral artery appears occluded to the level of L bulky calcified plaque seen on series 8, image 149. The distal left V4 segment appears remain patent including the left PICA origin, and is supplied in a retrograde fashion from the vertebrobasilar junction. The basilar artery is patent but highly irregular with moderate to severe multifocal distal basilar stenosis. Both PCA P1 segments are patent, although irregular and worse on the right side. Distal right PCA flow is more robust on the left. It is unclear whether the posterior communicating arteries are present. Anterior circulation: Both ICA siphons are patent but heavily calcified. Hemodynamically significant bilateral anterior genu and proximal supraclinoid segment stenosis is suspected. Both carotid termini are patent. The bilateral ACA A1 segments appear patent but diminutive. The anterior communicating artery and bilateral a 2 segments are patent but irregular. The left MCA M1 segment, bifurcation, and proximal left MCA branches are patent  but irregular. There is more severe irregularity of the right MCA M1 segment and right MCA M2 branches, which demonstrate attenuated flow when compared to the left side. Venous sinuses: Patent. Anatomic variants: None. Review of the MIP images confirms the above findings IMPRESSION: 1. Negative for emergent large vessel occlusion. 2. 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 This appears stable since the 2017 brain MRI, and there is retrograde reconstitution of the distal left V4 segment and left PICA. 3. No significant carotid or right vertebral artery stenosis in the neck despite atherosclerosis. 4. 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 There are moderate or severe stenoses of the bilateral ICA siphons, the right vertebral artery V4 segment which supplies the basilar, the basilar artery, and the right MCA M1 and right PCA P1 segments. These findings have progressed since the 2017 MRA. 5. Attenuated flow in the right MCA and right PCA branches, but better maintained flow in the bilateral ACA, left MCA and left PCA branches. 6. This study was reviewed in person with Dr. Erlinda Hong on 10/23/2016 at 1140 hours. Electronically Signed   By: Genevie Ann M.D.   On: 10/23/2016 11:47   Mr Brain Wo Contrast  Result Date: 10/23/2016 CLINICAL DATA:  Follow-up stroke. Known LEFT vertebral artery occlusion. EXAM: MRI HEAD WITHOUT CONTRAST TECHNIQUE: Axial and coronal diffusion weighted imaging, axial T2 FLAIR. Patient could not tolerate further imaging in the examination was terminated. COMPARISON:  CT HEAD October 23, 2016 at 1008 hours. FINDINGS: Severely motion degraded axial T2 FLAIR. BRAIN: 3 mm focus reduced diffusion central pons, not localized on ADC map. Punctate irregular likely artifact RIGHT inferior temporal lobe. Patchy reduced diffusion LEFT occipital lobe with low ADC values,  punctate reduced diffusion RIGHT occipital lobe. 18 x 7 mm reduced diffusion LEFT basal ganglia with low ADC values, punctate reduced diffusion RIGHT basal ganglia. Subcentimeter reduced diffusion LEFT frontal lobe and RIGHT mesial frontal lobe. 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 Patchy supratentorial white matter FLAIR T2 hyperintensities exclusive of the aforementioned abnormality. No hydrocephalus. No midline shift or mass effect. No abnormal extra-axial fluid collections. VASCULAR: Not assessed SKULL AND UPPER CERVICAL SPINE: Not assessed SINUSES/ORBITS: Not assessed OTHER: Not applicable IMPRESSION: 1. Limited 3 sequence motion degraded MRI of the head. 2. Numerous acute supra- and infratentorial infarcts, largest measuring 18 x 7 mm LEFT basal ganglia. Distribution suggests embolic etiology. 3. Moderate chronic small vessel ischemic disease better characterized on prior MRI. 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 Electronically Signed   By: Elon Alas M.D.   On: 10/23/2016 19:19   Ct Cerebral Perfusion W Contrast  Result Date: 10/23/2016 CLINICAL DATA:  50 year old male code stroke. Altered mental status and flaccid right arm. Last seen normal last night. EXAM: CT PERFUSION BRAIN TECHNIQUE: Multiphase CT imaging of the brain was performed following IV bolus contrast injection. Subsequent parametric perfusion maps were calculated using RAPID software. CONTRAST:  90 mL Isovue 370 IV contrast extravasation was observed in the CT suite during the CTA. Extravasated contrast volume estimated at 50 mL. Radiologist Dr. Janeece Fitting was consulted and examined the patient who was then returned to the ED for continued care. COMPARISON:  Head CT without contrast 1008 hours today. Brain MRI and intracranial MRA 03/21/2015 FINDINGS: CT Brain Perfusion Findings: CBF (<30%) Volume:  0 mL Perfusion (Tmax>6.0s) volume: 4 mL, inferior left temporal gyrus region. Where as less stringent T-max values begin to implicate contralateral  right corona radiata tissue. Mismatch Volume:  4 mL Infarction Location:Not applicable CTA CTA head and neck was also attempted at this time, but IV extravasation occurred, such that the attempted CTA images are noncontrast. I discussed this by telephone with Dr. Erlinda Hong On 10/23/2016 at 1045 hours, and he advises a new IV placement and repeat CTA is planned at this time. IMPRESSION: 1. CT perfusion does NOT detect core infarct. And CTP suggests what are probably artifactual hypoperfusion areas in both hemispheres. 2. Initial CTA was noncontrast and nondiagnostic due to 50 mL IV extravasation (see contrast section above). 3. I discussed the above by telephone with  Dr. Erlinda Hong On 10/23/2016 at 1045 hours, and he advises a new IV placement and repeat CTA is planned at this time. Electronically Signed   By: Genevie Ann M.D.   On: 10/23/2016 10:50   Dg Swallowing Func-speech Pathology  Result Date: 10/24/2016 Objective Swallowing Evaluation: Type of Study: MBS-Modified Barium Swallow Study Patient Details Name: OSEPH IMBURGIA MRN: [id number] Date of Birth: [birth year] Today's Date: 10/24/2016 Time: SLP Start Time (ACUTE ONLY): 1103-SLP Stop Time (ACUTE ONLY): 1121 SLP Time Calculation (min) (ACUTE ONLY): 18 min Past Medical History: Past Medical History: Diagnosis Date . Alcohol liver damage (HCC)   "just a little damage" . Anxiety  . Chronic kidney disease   "jsut a little damage" . Glaucoma  . History of stomach ulcers   not confirmed, never underwent diagnostic endo or radiology, this manifested as epigastric distress.  [name] Hx of adenomatous colonic polyps  . Hypercholesterolemia  . Hypertension  . Peripheral neuropathy  . Polycythemia vera ([place]) 2006 . Substance abuse  . Type II diabetes mellitus ([place])  Past Surgical History: Past Surgical History: Procedure Laterality Date . COLONOSCOPY WITH PROPOFOL N/A 04/28/2014  Procedure: COLONOSCOPY WITH PROPOFOL;  Surgeon: Gatha Mayer, MD;  Location: [facility];  Service: Endoscopy;   Laterality: N/A; . IRRIGATION AND DEBRIDEMENT ABSCESS  09/26/2011  Procedure: IRRIGATION AND DEBRIDEMENT ABSCESS;  Surgeon: Imogene Burn. Georgette Dover, MD;  Location: [facility];  Service: General;  Laterality: Left;  Incision and Drainage left breast abscess HPI: IMER FOXWORTH a 51 y.o.malepresenting with right sided weakness and aphasia. PMH is significant for CKD, HTN, HLD, polysubstance abuse, polycythemia vera, DM2. MRI Numerous acute supra- and infratentorial infarcts, largest measuring 18 x 7 mm LEFT basal ganglia. Distribution suggests embolic etiology. Moderate chronic small vessel ischemic disease better characterized on prior MRI. 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 BSE 02/2015 recommended regular/thin. Family denies prior dysphagia with old strokes. MBS recommended following BSE.  No Data Recorded Assessment / Plan / Recommendation [facility] IP CLINICAL IMPRESSIONS 10/24/2016 Clinical Impression Pt exhibited significantly decreased lingual cohesion/control leading to anterior spill, anterior sulci residue, holding, piecemeal swallow pattern and lingual residue. Laryngeal penetration before the swallow due to poor oral control reaching vocal cords, silently with nectar. Penetration during multiple swallows with nectar cleared vestibule. Pharyngeal residuals from mod-max given motor weakness. Requires max cues for volitional swallow that reduce quantity. Pt is impulsive and requires full supervisio with po's. Recommend Dys 1, honey via TEASPOON only, crush meds, sit upright, 2 swallows, check oral cavity for pocketing.       SLP Visit Diagnosis Dysphagia, oropharyngeal phase (R13.12) Attention and concentration deficit following -- Frontal lobe and executive function deficit following -- Impact on safety and function Severe aspiration risk   [facility] IP TREATMENT RECOMMENDATION 10/24/2016 Treatment Recommendations Therapy as outlined in treatment plan below   Prognosis 10/24/2016 Prognosis for Safe Diet Advancement Good Barriers to Reach  Goals Behavior Barriers/Prognosis Comment -- [facility] IP DIET RECOMMENDATION 10/24/2016 SLP Diet Recommendations Dysphagia 1 (Puree) solids;Honey thick liquids Liquid Administration via Spoon Medication Administration Crushed with puree Compensations Slow rate;Minimize environmental distractions;Small sips/bites;Lingual sweep for clearance of pocketing;Monitor for anterior loss;Multiple dry swallows after each bite/sip Postural Changes Seated upright at 90 degrees   [facility] IP OTHER RECOMMENDATIONS 10/24/2016 Recommended Consults -- Oral Care Recommendations Oral care BID Other Recommendations --   [facility] IP FOLLOW UP RECOMMENDATIONS 10/24/2016 Follow up Recommendations Inpatient Rehab   [facility] IP FREQUENCY AND DURATION 10/24/2016 Speech Therapy Frequency (ACUTE ONLY) min 2x/week Treatment Duration 2  weeks      [facility] IP ORAL PHASE 10/24/2016 Oral Phase Impaired Oral - Pudding Teaspoon -- Oral - Pudding Cup -- Oral - Honey Teaspoon Delayed oral transit;Weak lingual manipulation;Holding of bolus;Pocketing in anterior sulcus;Lingual/palatal residue Oral - Honey Cup Delayed oral transit;Decreased bolus cohesion;Right anterior bolus loss;Weak lingual manipulation;Holding of bolus;Pocketing in anterior sulcus;Lingual/palatal residue;Piecemeal swallowing Oral - Nectar Teaspoon -- Oral - Nectar Cup Decreased bolus cohesion;Delayed oral transit;Reduced posterior propulsion;Lingual/palatal residue;Pocketing in anterior sulcus;Right anterior bolus loss;Premature spillage Oral - Nectar Straw -- Oral - Thin Teaspoon -- Oral - Thin Cup -- Oral - Thin Straw -- Oral - Puree Delayed oral transit;Weak lingual manipulation;Lingual/palatal residue Oral - Mech Soft -- Oral - Regular -- Oral - Multi-Consistency -- Oral - Pill -- Oral Phase - Comment --  [facility] IP PHARYNGEAL PHASE 10/24/2016 Pharyngeal Phase Impaired Pharyngeal- Pudding Teaspoon -- Pharyngeal -- Pharyngeal- Pudding Cup -- Pharyngeal -- Pharyngeal- Honey Teaspoon Penetration/Aspiration during  swallow;Pharyngeal residue - valleculae;Pharyngeal residue - pyriform;Reduced epiglottic inversion;Reduced laryngeal elevation Pharyngeal Material enters airway, remains ABOVE vocal cords then ejected out Pharyngeal- Honey Cup Pharyngeal residue - valleculae;Pharyngeal residue - pyriform;Reduced epiglottic inversion;Reduced laryngeal elevation Pharyngeal -- Pharyngeal- Nectar Teaspoon -- Pharyngeal -- Pharyngeal- Nectar Cup Penetration/Aspiration during swallow;Pharyngeal residue - pyriform;Pharyngeal residue - valleculae;Reduced epiglottic inversion;Reduced laryngeal elevation Pharyngeal Material enters airway, passes BELOW cords then ejected out;Material enters airway, remains ABOVE vocal cords and not ejected out Pharyngeal- Nectar Straw -- Pharyngeal -- Pharyngeal- Thin Teaspoon -- Pharyngeal -- Pharyngeal- Thin Cup -- Pharyngeal -- Pharyngeal- Thin Straw -- Pharyngeal -- Pharyngeal- Puree Pharyngeal residue - valleculae;Reduced tongue base retraction Pharyngeal -- Pharyngeal- Mechanical Soft -- Pharyngeal -- Pharyngeal- Regular -- Pharyngeal -- Pharyngeal- Multi-consistency -- Pharyngeal -- Pharyngeal- Pill -- Pharyngeal -- Pharyngeal Comment --  [facility] IP CERVICAL ESOPHAGEAL PHASE 10/24/2016 Cervical Esophageal Phase WFL Pudding Teaspoon -- Pudding Cup -- Honey Teaspoon -- Honey Cup -- Nectar Teaspoon -- Nectar Cup -- Nectar Straw -- Thin Teaspoon -- Thin Cup -- Thin Straw -- Puree -- Mechanical Soft -- Regular -- Multi-consistency -- Pill -- Cervical Esophageal Comment -- No flowsheet data found. [place] Siren 10/24/2016, 12:24 PM Eric Morton Caroli.Ed CCC-SLP Pager [phone]              Ct Head Code Stroke Wo Contrast`  Result Date: 10/23/2016 CLINICAL DATA:  Code stroke. 51 year old male with altered mental status, right arm flaccid. Last known normal last night. EXAM: CT HEAD WITHOUT CONTRAST TECHNIQUE: Contiguous axial images were obtained from the base of the skull through the vertex without  intravenous contrast. COMPARISON:  Brain MRI, intracranial MRA and head CT 03/21/2015 FINDINGS: Brain: Chronic deep gray matter, left cerebellar, and scattered white matter lacunar infarcts as demonstrated on the 2017 MRI. Expected Evolution of the 2017 right corona radiata infarct. Gray-white matter differentiation elsewhere appears stable. No cortically based acute infarct identified. No acute intracranial hemorrhage identified. No midline shift, mass effect, or evidence of intracranial mass lesion. No ventriculomegaly. Vascular: Calcified atherosclerosis at the skull base. No suspicious intracranial vascular hyperdensity. Skull: Chronic right lamina papyracea fracture. No acute osseous abnormality identified. Sinuses/Orbits: Chronic ethmoid opacification. Chronic right frontoethmoidal recess opacification. Stable paranasal sinuses. Mastoids and tympanic cavities remain clear. Other: Mild leftward gaze deviation. No acute orbit or scalp soft tissue findings. ASPECTS [facility] Stroke Program Early CT Score) - Ganglionic level infarction (caudate, lentiform nuclei, internal capsule, insula, M1-M3 cortex): 7 - Supraganglionic infarction (M4-M6 cortex): 3 Total score (0-10 with 10 being normal): 10 IMPRESSION: 1. 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 No acute cortically based infarct or acute intracranial hemorrhage identified. 2. ASPECTS is 10. 3. The above was relayed via text pager to Dr. Erlinda Hong On 10/23/2016 at 10:16 . Electronically Signed   By: Genevie Ann M.D.   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 Does the need for close, 24 hr/day medical supervision in concert with the patient's rehab needs make it unreasonable for this patient to be served in a less intensive setting? Yes 2. Co-Morbidities requiring  supervision/potential complications: HTN, polysubstance abuse,  3. Due to bladder management, bowel management, safety, skin/wound care, disease management, medication administration, pain management and patient education, does the patient require 24 hr/day rehab nursing? Yes 4. Does the patient require coordinated care of a physician, rehab nurse, PT (1-2 hrs/day, 5 days/week), OT (1-2 hrs/day, 5 days/week) and SLP (1-2 hrs/day, 5 days/week) to address physical and functional deficits in the context of the above medical diagnosis(es)? Yes Addressing deficits in the following areas: balance, endurance, locomotion, strength, transferring, bowel/bladder control, bathing, dressing, feeding, grooming, toileting, cognition, speech, swallowing and psychosocial support 5. Can the patient actively participate in an intensive therapy program of at least 3 hrs of therapy per day at least 5 days per week? Yes 6. The potential for patient to make measurable gains while on inpatient rehab is good 7. Anticipated functional outcomes upon discharge from inpatient rehab are modified independent and supervision  with PT, modified independent and supervision with OT, supervision and min assist with SLP. 8. Estimated rehab length of stay to reach the above functional goals is: 7-12 days 9. Anticipated D/C setting: Home 10. Anticipated post D/C treatments: HH therapy and Outpatient therapy 11. Overall Rehab/Functional Prognosis: excellent  RECOMMENDATIONS: This patient's condition is appropriate for continued rehabilitative care in the following setting: CIR Patient has agreed to participate in recommended program. Yes Note that insurance prior authorization may be required for reimbursement for recommended care.  Comment: Rehab Admissions Coordinator to follow up.  Thanks,  Meredith Staggers, MD, Mellody Drown    Meredith Staggers, MD 10/25/2016     Routing History

## 2016-10-27 NOTE — Progress Notes (Signed)
Occupational Therapy Treatment Patient Details Name: Eric Morton MRN: [id number] DOB: November 22, 1965 Today's Date: 10/27/2016    History of present illness Eric Morton is a 51 y.o. male presenting with right sided weakness and aphasia. PMH is significant for CKD, HTN, HLD, polysubstance abuse, polycythemia vera, DM2. MRI Numerous acute supra- and infratentorial infarcts, largest measuring 18 x 7 mm LEFT basal ganglia. Distribution suggests embolic etiology. 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    OT comments  Pt progressing towards establsihed OT goals. Pt performed self-feeding using RUE (dominant hand) with hand-over-hand and support at elbow. Pt demonstrating increased awareness to take small bites and and swallow completely. Pt performed LB dressing with Mod-Max A +2 for bring pants over RLE and over hips in standing. Continue to recommend dc to CIR to optimize safety and independence with ADLs and functional mobility. Will continues to follow to facilitate safe dc.    Follow Up Recommendations  CIR;Supervision/Assistance - 24 hour    Equipment Recommendations  Other (comment) (TBD at next venue)    Recommendations for Other Services Rehab consult    Precautions / Restrictions Precautions Precautions: Fall Restrictions Weight Bearing Restrictions: No       Mobility Bed Mobility               General bed mobility comments: EOB on arrival  Transfers Overall transfer level: Needs assistance Equipment used: Rolling walker (2 wheeled) Transfers: Sit to/from Stand Sit to Stand: Mod assist;+2 physical assistance;+2 safety/equipment Stand pivot transfers: Mod assist;+2 physical assistance       General transfer comment: assist for coming forward, stability and assisting with R UE    Balance Overall balance assessment: Needs assistance Sitting-balance support: Feet supported;No upper extremity supported Sitting balance-Leahy Scale: Fair Sitting balance - Comments: initially  requiring UE support but progressed to unsupported sitting, able to dynamically move LEs and shift weight, difficulty with Rt weight shift and return to midline   Standing balance support: Bilateral upper extremity supported;Single extremity supported Standing balance-Leahy Scale: Fair Standing balance comment: balanced without assist and use of at least 1 UE                           ADL either performed or assessed with clinical judgement   ADL Overall ADL's : Needs assistance/impaired Eating/Feeding: Set up;Supervision/ safety;Maximal assistance;Sitting Eating/Feeding Details (indicate cue type and reason): Pt performed self feeding with hand over hand A and support at elbow to use RUE throughout feeding. Pt able to maintain sitting balance at EOB.                  Lower Body Dressing: Moderate assistance;Maximal assistance;Sit to/from stand Lower Body Dressing Details (indicate cue type and reason): Max A to don pants over feed and over hips with once handed techniques. Pt requiring Max A to maintain standing while bringing patns over hips.              Functional mobility during ADLs: Moderate assistance;+2 for physical assistance;Rolling walker;Cueing for safety;Cueing for sequencing;Maximal assistance General ADL Comments: Pt performing self feeding with hand over hand A and support at the elbow to bring spoon to mouth. Pt demonstrating increased awarness to take small bites and make sure he swallows fully. Pt performed LB dressing with Mod-Max A +2 requiring assisntace to bring RLE into pant leg and then pull pants over hips.     Vision       Perception  Praxis      Cognition Arousal/Alertness: Awake/alert Behavior During Therapy: WFL for tasks assessed/performed (easily frustrated) Overall Cognitive Status: Difficult to assess Area of Impairment: Safety/judgement;Problem solving                         Safety/Judgement: Decreased  awareness of safety   Problem Solving: Requires verbal cues;Requires tactile cues General Comments: Pt demonstrating increased awareness and nonverbal communication. Pt spelling out words to communicate. Pt requiring VCs for problem solving self feeding and fucntional miobility.         Exercises     Shoulder Instructions       General Comments      Pertinent Vitals/ Pain       Pain Assessment: Faces Faces Pain Scale: No hurt Pain Intervention(s): Monitored during session  Home Living   Living Arrangements:  (lives with [name] at family home)   Type of Home:  (lives in "little house" behind [name]'s "big House")             Bathroom Shower/Tub: Other (comment) (has 1/2 bath in "little house". goes to Aunt's home next doo)     Bathroom Accessibility: Yes How Accessible: Accessible via walker     Additional Comments: denied disability after past CVA  Lives With: Family ([name] and cousin [name])    Prior Functioning/Environment          Comments: H/o CVA   Frequency  Min 3X/week        Progress Toward Goals  OT Goals(current goals can now be found in the care plan section)  Progress towards OT goals: Progressing toward goals  Acute Rehab OT Goals Patient Stated Goal: Go to rehab OT Goal Formulation: With patient Time For Goal Achievement: 11/07/16 Potential to Achieve Goals: Good ADL Goals Pt Will Perform Grooming: with min assist;sitting (using RUE 50% of time) Pt Will Perform Upper Body Bathing: with min assist;sitting Pt Will Perform Lower Body Bathing: with min assist (Mod A standing) Pt Will Perform Upper Body Dressing: with min assist;sitting Pt Will Perform Lower Body Dressing: with min assist (Mod A sit<>stand) Pt Will Transfer to Toilet: with mod assist;stand pivot transfer;bedside commode  Plan Discharge plan remains appropriate    Co-evaluation    PT/OT/SLP Co-Evaluation/Treatment: Yes Reason for Co-Treatment:  Complexity of the patient's impairments (multi-system involvement) PT goals addressed during session: Mobility/safety with mobility OT goals addressed during session: ADL's and self-care      AM-PAC PT "6 Clicks" Daily Activity     Outcome Measure   Help from another person eating meals?: A Little Help from another person taking care of personal grooming?: A Lot Help from another person toileting, which includes using toliet, bedpan, or urinal?: A Lot Help from another person bathing (including washing, rinsing, drying)?: A Lot Help from another person to put on and taking off regular upper body clothing?: A Lot Help from another person to put on and taking off regular lower body clothing?: A Lot 6 Click Score: 13    End of Session Equipment Utilized During Treatment: Gait belt  OT Visit Diagnosis: Unsteadiness on feet (R26.81);Other abnormalities of gait and mobility (R26.89);Muscle weakness (generalized) (M62.81);Cognitive communication deficit (R41.841);Hemiplegia and hemiparesis Symptoms and signs involving cognitive functions: Cerebral infarction Hemiplegia - Right/Left: Right Hemiplegia - dominant/non-dominant: Dominant Hemiplegia - caused by: Cerebral infarction   Activity Tolerance Patient tolerated treatment well   Patient Left with call bell/phone within reach;with family/visitor present (at EOB)  Nurse Communication Mobility status        Time: 7282-0601 OT Time Calculation (min): 43 min  Charges: OT General Charges $OT Visit: 1 Visit OT Treatments $Self Care/Home Management : 23-37 mins  [place], OTR/L Acute Rehab Pager: [phone] Office: [place] 10/27/2016, 3:35 PM

## 2016-10-27 NOTE — Progress Notes (Signed)
I met with pt at bedside, but it is difficult to communicate with due to his aphasia. I contacted his NOK, [name], by phone to discuss goals of an inpt rehab admission. She prefers inpt rehab and is in agreement to admit. I contacted Attending service and they are in agreement to d/c to inpt rehab today. I will alert RN CM and SW. I will make the arrangements to admit today. [phone]

## 2016-10-27 NOTE — H&P (Signed)
Physical Medicine and Rehabilitation Admission H&P    Chief Complaint  Patient presents with  . 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 Per chart review, patient lives alone in an apartment connected to his aunt's home. Independent prior to admission. He has a cousin with good support. Presented on 10/23/16 with right hemiparesis and dysarthria. Work up revealed UDS + for cocaine. BP elevated into the 200'/100's requiring IV labetalol. CT cerebral perfusion scan did not detect for infarction. CT angiogram head and neck negative for emergent large vessel occlusion. No significant carotid or right vertebral artery stenosis in the neck despite atherosclerosis. 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 MRI of the brain reviewed, showing multiple CVAs. Per report, numerous infarcts including a large 18x49mm left basal ganglia infarct suggestive of an embolic etiology. Echocardiogram with ejection fraction of 62% grade 1 diastolic dysfunction. Bilateral lower extremity Dopplers negative. Pt on D1/honey thick liquid dit currently.  Currently maintained on aspirin and Plavix for CVA prophylaxis. Subcutaneous Lovenox for DVT prophylaxis. Hemoglobin A1c of 8.9. Patient was admitted for a comprehensive rehabilitation program  Review of Systems  Constitutional: Negative for chills and fever.  HENT: Negative for hearing loss.   Eyes: Negative for blurred vision and double vision.  Respiratory: Positive for cough.   Cardiovascular: Negative for chest pain, palpitations and leg swelling.  Gastrointestinal: Positive for constipation. Negative for nausea and vomiting.  Genitourinary: Negative for dysuria, flank pain and hematuria.  Musculoskeletal: Positive for joint pain and  myalgias.  Neurological: Positive for speech change and weakness.  Psychiatric/Behavioral:       Anxiety  All other systems reviewed and are negative.  Past Medical History:  Diagnosis Date  . 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 Anxiety   . Chronic kidney disease    "jsut a little damage"  . Glaucoma   . History of stomach ulcers    not confirmed, never underwent diagnostic endo or radiology, this manifested as epigastric distress.   [name] Hx of adenomatous colonic polyps   . Hypercholesterolemia   . Hypertension   . Peripheral neuropathy   . Polycythemia vera ([place]) 2006  . Substance abuse   . Type II diabetes mellitus ([place])    Past Surgical History:  Procedure Laterality Date  . COLONOSCOPY WITH PROPOFOL N/A 04/28/2014   Procedure: COLONOSCOPY WITH PROPOFOL;  Surgeon: Gatha Mayer, MD;  Location: [facility];  Service: Endoscopy;  Laterality: N/A;  . IRRIGATION AND DEBRIDEMENT ABSCESS  09/26/2011   Procedure: IRRIGATION AND DEBRIDEMENT ABSCESS;  Surgeon: Imogene Burn. Georgette Dover, MD;  Location: [facility];  Service: General;  Laterality: Left;  Incision and Drainage left breast abscess   Family History  Problem Relation Age of Onset  . Diabetes Mother   . Hypertension Mother    Social History:  reports that he has been smoking Cigarettes.  He has a 8.00 pack-year smoking history. He has never used smokeless tobacco. He reports that he drinks alcohol. He reports that he uses drugs, including Marijuana and "Crack" cocaine. Allergies: No Known Allergies Medications Prior to Admission  Medication Sig Dispense Refill  . aspirin EC 81 MG tablet Take 1 tablet (81 mg total) by mouth daily. 30 tablet 2  . gabapentin (NEURONTIN) 300 MG  capsule Take 2 capsules (600 mg total) by mouth 2 (two) times daily. (Patient taking differently: Take 300 mg by mouth daily. ) 120 capsule 5  . glipiZIDE (GLUCOTROL) 5 MG tablet Take 1 tablet (5 mg total) by mouth 2 (two) times daily before a meal.  (Patient taking differently: Take 5 mg by mouth daily before breakfast. ) 60 tablet 5  . lisinopril (PRINIVIL,ZESTRIL) 20 MG tablet Take 1 tablet (20 mg total) by mouth daily. 30 tablet 5  . metFORMIN (GLUCOPHAGE) 1000 MG tablet Take 1 tablet (1,000 mg total) by mouth 2 (two) times daily with a meal. (Patient taking differently: Take 1,000 mg by mouth daily with breakfast. ) 60 tablet 5  . simvastatin (ZOCOR) 40 MG tablet Take 1 tablet (40 mg total) by mouth at bedtime. 30 tablet 5  . thiamine 100 MG tablet Take 1 tablet (100 mg total) by mouth daily. 30 tablet 2  . folic acid (FOLVITE) 1 MG tablet Take 1 tablet (1 mg total) by mouth daily. (Patient not taking: Reported on 10/23/2016) 30 tablet 1  . pantoprazole (PROTONIX) 40 MG tablet Take 1 tablet (40 mg total) by mouth daily at 6 (six) AM. (Patient not taking: Reported on 10/23/2016) 30 tablet 0    Drug Regimen Review Drug regimen was reviewed and remains appropriate with no significant issues identified  Home: Home Living Family/patient expects to be discharged to:: Inpatient rehab Living Arrangements: Other relatives Available Help at Discharge: Family, Available 24 hours/day Type of Home: Apartment Home Access: Level entry Home Layout: One level Bathroom Shower/Tub: Chiropodist: Standard Home Equipment: None  Lives With: Other (Comment) (Significant other)   Functional History: Prior Function Level of Independence: Independent  Functional Status:  Mobility: Bed Mobility Overal bed mobility: Needs Assistance Bed Mobility: Supine to Sit, Sit to Supine Supine to sit: Min assist Sit to supine: Min guard General bed mobility comments: Min assist for truncal support and balance while rising to EOB for safety as well. Min guard with return to bed, using VC for technique. Transfers Overall transfer level: Needs assistance Equipment used: Rolling walker (2 wheeled) Transfers: Sit to/from Stand Sit to Stand: Mod  assist Stand pivot transfers: Max assist General transfer comment: Mod assist for balance with rising from bed x5 and chair with arm rests x2. Tactile cues for UE placement. Verbal cues for hand placement and safety. Progressed to a min assist level from bed. Ambulation/Gait Ambulation/Gait assistance: Mod assist Ambulation Distance (Feet): 15 Feet (x4) Assistive device: Rolling walker (2 wheeled) Gait Pattern/deviations: Step-to pattern, Decreased stride length, Decreased stance time - right, Decreased dorsiflexion - right, Ataxic, Drifts right/left, Narrow base of support General Gait Details: Able to ambulate several short distance bouts today with mod assist for balance, demonstrating signficant ataxia, specifically noteable with RLE. Responds well to verbal cues but requires assist with walker navigation, and sequencing of gait including RLE foot placement. Gait velocity: decreased Gait velocity interpretation: <1.8 ft/sec, indicative of risk for recurrent falls    ADL: ADL Overall ADL's : Needs assistance/impaired Eating/Feeding: Set up, Supervision/ safety Eating/Feeding Details (indicate cue type and reason): full S for meals due to impulsivity per SLP and pocketing Grooming: Moderate assistance, Sitting Upper Body Bathing: Moderate assistance, Sitting Lower Body Bathing: Maximal assistance Lower Body Bathing Details (indicate cue type and reason): MOd A sit<>stand, Max A to maintain standing Upper Body Dressing : Moderate assistance, Sitting Lower Body Dressing: Moderate assistance Lower Body Dressing Details (indicate cue type and reason):  MOd A sit<>stand, Max A to maintain standing Toilet Transfer: Maximal assistance, Buyer, retail Details (indicate cue type and reason): blocking right knee Toileting- Clothing Manipulation and Hygiene: Maximal assistance Toileting - Clothing Manipulation Details (indicate cue type and reason): MOd A sit<>stand, Max A to maintain  standing  Cognition: Cognition Overall Cognitive Status: Difficult to assess Arousal/Alertness: Awake/alert Orientation Level: Oriented X4 Attention: Selective Selective Attention: Appears intact Memory: Appears intact Awareness: Appears intact Behaviors: Poor frustration tolerance Safety/Judgment: Appears intact Cognition Arousal/Alertness: Awake/alert Behavior During Therapy: Impulsive Overall Cognitive Status: Difficult to assess Area of Impairment: Safety/judgement, Problem solving Safety/Judgement: Decreased awareness of safety, Decreased awareness of deficits Problem Solving: Requires verbal cues General Comments: Responds well to verbal cues and was very receptive of safety instructions once educated. Difficult to assess due to: Impaired communication  Physical Exam: Blood pressure (!) 144/93, pulse 82, temperature 98 F (36.7 C), temperature source Oral, resp. rate 18, height 5\' 7"  (1.702 m), weight 74.1 kg (163 lb 4.8 oz), SpO2 98 %. Physical Exam  Vitals reviewed. Constitutional: He appears well-developed and well-nourished.  HENT:  Head: Normocephalic and atraumatic.  Poor dentition  Eyes: EOM are normal. Right eye exhibits no discharge. Left eye exhibits no discharge.  Neck: Normal range of motion. Neck supple. No thyromegaly present.  Cardiovascular: Normal rate, regular rhythm and normal heart sounds.   Respiratory: Effort normal and breath sounds normal. No respiratory distress.  GI: Soft. Bowel sounds are normal. He exhibits no distension. There is no tenderness.  Musculoskeletal: He exhibits no edema or tenderness.  Neurological: He is alert.  Severe dysarthria Right facial weakness, tongue deviation.  Motor: RUE: 0/5 proximal to distal RLE 4/5 proximal to distal.  Skin: Skin is warm and dry.  Psychiatric: His affect is labile. His speech is slurred.  ?PBA    Results for orders placed or performed during the hospital encounter of 10/23/16 (from the  past 48 hour(s))  Glucose, capillary     Status: Abnormal   Collection Time: 10/25/16  8:02 AM  Result Value Ref Range   Glucose-Capillary 123 (H) 65 - 99 mg/dL  Glucose, capillary     Status: Abnormal   Collection Time: 10/25/16 12:42 PM  Result Value Ref Range   Glucose-Capillary 159 (H) 65 - 99 mg/dL  Glucose, capillary     Status: None   Collection Time: 10/25/16  5:01 PM  Result Value Ref Range   Glucose-Capillary 95 65 - 99 mg/dL  Glucose, capillary     Status: Abnormal   Collection Time: 10/25/16  9:40 PM  Result Value Ref Range   Glucose-Capillary 152 (H) 65 - 99 mg/dL  Glucose, capillary     Status: None   Collection Time: 10/26/16  1:47 AM  Result Value Ref Range   Glucose-Capillary 98 65 - 99 mg/dL  Glucose, capillary     Status: Abnormal   Collection Time: 10/26/16  4:29 AM  Result Value Ref Range   Glucose-Capillary 106 (H) 65 - 99 mg/dL   Comment 1 Notify RN   Glucose, capillary     Status: Abnormal   Collection Time: 10/26/16  8:35 AM  Result Value Ref Range   Glucose-Capillary 109 (H) 65 - 99 mg/dL  Glucose, capillary     Status: Abnormal   Collection Time: 10/26/16 12:03 PM  Result Value Ref Range   Glucose-Capillary 136 (H) 65 - 99 mg/dL  Glucose, capillary     Status: Abnormal   Collection Time: 10/26/16  5:07 PM  Result Value Ref Range   Glucose-Capillary 152 (H) 65 - 99 mg/dL  Glucose, capillary     Status: Abnormal   Collection Time: 10/26/16  8:57 PM  Result Value Ref Range   Glucose-Capillary 157 (H) 65 - 99 mg/dL  Glucose, capillary     Status: Abnormal   Collection Time: 10/27/16  1:23 AM  Result Value Ref Range   Glucose-Capillary 128 (H) 65 - 99 mg/dL  Glucose, capillary     Status: Abnormal   Collection Time: 10/27/16  5:01 AM  Result Value Ref Range   Glucose-Capillary 112 (H) 65 - 99 mg/dL   No results found.     Medical Problem List and Plan: 1.  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 History of CVA 2017 with right-sided residual weakness 2.  DVT Prophylaxis/Anticoagulation: Subcutaneous Lovenox. Venous Doppler studies negative 3. Pain Management: Neurontin 300 mg daily at bedtime 4. Mood: Ego support 5. Neuropsych: This patient is capable of making decisions on his own behalf. 6. Skin/Wound Care:  Routine skin checks 7. Fluids/Electrolytes/Nutrition:  Routine I&O with follow-up chemistries 8. Dysphagia. Dysphagia #1 honey liquids. Monitor hydration. Follow-up speech therapy 9.Hypertension. Lisinopril 5 mg daily 10. Diabetes mellitus peripheral neuropathy. Hemoglobin A1c 8.9. SSI.Check blood sugars before meals and at bedtime. Patient on Glucotrol 5 mg twice a day, Glucophage 1000 mg twice a day. Resume as needed 11.Polysubstance abuse. UDS positive for cocaine. Provide counseling 12.History polycythemia. Follow-up labs 13. ?PBA: Monitor.  Will consider Neuropsych and meds.   Post Admission Physician Evaluation: 1. Preadmission assessment reviewed and changes made below. 2. Functional deficits secondary  to embolic CVA > left basal ganglia. 3. Patient is admitted to receive collaborative, interdisciplinary care between the physiatrist, rehab nursing staff, and therapy team. 4. Patient's level of medical complexity and substantial therapy needs in context of that medical necessity cannot be provided at a lesser intensity of care such as a SNF. 5. Patient has experienced substantial functional loss from his/her baseline which was documented above under the "Functional History" and "Functional Status" headings.  Judging by the patient's diagnosis, physical exam, and functional history, the patient has potential for functional progress which will result in measurable gains while on inpatient rehab.  These gains will be of substantial and practical use upon discharge  in facilitating mobility and self-care at the household  level. 6. Physiatrist will provide 24 hour management of medical needs as well as oversight of the therapy plan/treatment and provide guidance as appropriate regarding the interaction of the two. 7. 24 hour rehab nursing will assist with safety and patient education  and help integrate therapy concepts, techniques,education, etc. 8. PT will assess and treat for/with: Lower extremity strength, range of motion, stamina, balance, functional mobility, safety, adaptive techniques and equipment, coping skills, pain control, stroke education.   Goals are: Min A. 9. OT will assess and treat for/with: ADL's, functional mobility, safety, upper extremity strength, adaptive techniques and equipment, ego support, and community reintegration.   Goals are: Min A. Therapy may proceed with showering this patient. 10. SLP will assess and treat for/with: speech, swallowing.  Goals are: Min A. 11. Case Management and Social Worker will assess and treat for psychological issues and discharge planning. 12. Team conference will be held weekly to assess progress toward goals and to determine barriers to discharge. 13. Patient will receive at least 3 hours of therapy per day at least 5 days per week.  14. ELOS: 20-25 days.       15. Prognosis:  good  Delice Lesch, MD, ABPMR Lauraine Rinne J., PA-C 10/27/2016

## 2016-10-27 NOTE — Progress Notes (Signed)
[name], RN Rehab Admission Coordinator Signed Physical Medicine and Rehabilitation  PMR Pre-admission Date of Service: 10/27/2016 12:31 PM  Related encounter: ED to Hosp-Admission (Current) from 10/23/2016 in [place]       [] Hide copied text PMR Admission Coordinator Pre-Admission Assessment  Patient: Eric Morton is an 51 y.o., male MRN: [id number] DOB: January 05, 1966 Height: 5\' 7"  (170.2 cm) Weight: 74.1 kg (163 lb 4.8 oz)                                                                                                                                                  Insurance Information  PRIMARY: uninsured  Medicaid Application Date:       Case Manager:  Disability Application Date:       Case Worker:  I left a message for financial counselor, Abelina Bachelor at [phone] to request medicaid and disability. Pt's [name] states pt has previously been denied disability and she would like to discuss reapplying with financial counselor.  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  Presented on 10/23/16 with right hemiparesis and dysarthria. Work up revealed UDS + for cocaine. BP elevated into the 200'/100's requiring IV labetalol. CT cerebral perfusion scan did not detect for infarction. CT angiogram head and neck negative for emergent large vessel occlusion. No significant carotid or right vertebral artery stenosis in the neck despite  atherosclerosis. 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 Echocardiogram with ejection fraction of 24% grade 1 diastolic dysfunction. Bilateral lower extremity Dopplers negative.Pt on D1/honey thick liquid dit currently. Currently maintained on aspirin and Plavix for CVA prophylaxis. subcutaneous Lovenox for DVT prophylaxis. Hemoglobin A1c of 8.9.  Total: 9 NIHSS  Past Medical History      Past Medical History:  Diagnosis Date  . 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 Anxiety   . Chronic kidney disease    "jsut a little damage"  . Glaucoma   . History of stomach ulcers    not confirmed, never underwent diagnostic endo or radiology, this manifested as epigastric distress.   [name] Hx of adenomatous colonic polyps   . Hypercholesterolemia   . Hypertension   . Peripheral neuropathy   .  Polycythemia vera ([place]) 2006  . Substance abuse   . Type II diabetes mellitus (HCC)     Family History  family history includes Diabetes in his mother; Hypertension in his mother.  Prior Rehab/Hospitalizations:  Has the patient had major surgery during 100 days prior to admission? No  Current Medications   Current Facility-Administered Medications:  .   stroke: mapping our early stages of recovery book, , Does not apply, Once, Smiley Houseman, MD .  acetaminophen (TYLENOL) tablet 650 mg, 650 mg, Oral, Q4H PRN, 650 mg at 10/26/16 2118 **OR** acetaminophen (TYLENOL) solution 650 mg, 650 mg, Per Tube, Q4H PRN **OR** acetaminophen (TYLENOL) suppository 650 mg, 650 mg, Rectal, Q4H PRN, Smiley Houseman, MD .  Derrill Memo ON 10/28/2016] aspirin tablet 325 mg, 325 mg, Oral, Daily, Gonfa, Taye T, MD .  clopidogrel (PLAVIX) tablet 75 mg, 75 mg, Oral, Daily, Rosalin Hawking, MD, 75 mg at 10/27/16 0956 .   enoxaparin (LOVENOX) injection 40 mg, 40 mg, Subcutaneous, Q24H, Chambliss, Jeb Levering, MD, 40 mg at 10/27/16 0956 .  folic acid (FOLVITE) tablet 1 mg, 1 mg, Oral, Daily, Smiley Houseman, MD, 1 mg at 10/27/16 0955 .  gabapentin (NEURONTIN) capsule 300 mg, 300 mg, Oral, QHS, Rumball, Alison, DO, 300 mg at 10/27/16 0203 .  hydrALAZINE (APRESOLINE) injection 5-10 mg, 5-10 mg, Intravenous, Q2H PRN, Rosalin Hawking, MD .  insulin aspart (novoLOG) injection 0-9 Units, 0-9 Units, Subcutaneous, Q4H, Smiley Houseman, MD, 1 Units at 10/27/16 0209 .  [START ON 10/28/2016] lisinopril (PRINIVIL,ZESTRIL) tablet 10 mg, 10 mg, Oral, Daily, Diallo, Abdoulaye, MD .  multivitamin with minerals tablet 1 tablet, 1 tablet, Oral, Daily, Smiley Houseman, MD, 1 tablet at 10/27/16 0955 .  RESOURCE THICKENUP CLEAR, , Oral, PRN, Chambliss, Marshall L, MD .  rosuvastatin (CRESTOR) tablet 40 mg, 40 mg, Oral, q1800, Lockamy, Timothy, DO, 40 mg at 10/26/16 1817 .  thiamine (VITAMIN B-1) tablet 100 mg, 100 mg, Oral, Daily, 100 mg at 10/27/16 0955 **OR** thiamine (B-1) injection 100 mg, 100 mg, Intravenous, Daily, Dallas Schimke, Almond Lint, MD  Patients Current Diet: DIET - DYS 1 Room service appropriate? Yes; Fluid consistency: Honey Thick Diet - low sodium heart healthy  Precautions / Restrictions Precautions Precautions: Fall Restrictions Weight Bearing Restrictions: No   Has the patient had 2 or more falls or a fall with injury in the past year?No  Prior Activity Level Community (5-7x/wk): Independent without AD pta; does not drive for does not have a car. Pt "disabled" since his previous CVA. Aunt states he watches TV a lot, smokes and drinks. Hangs out with his cousin, [name]. Has girlfriend , Hulda Marin. Lives alone in secondary house to Aunt's home.   Home Assistive Devices / Equipment Home Equipment: None  Prior Device Use: Indicate devices/aids used by the patient prior to current illness, exacerbation  or injury? None of the above  Prior Functional Level Prior Function Level of Independence: Independent Comments: H/o CVA  Self Care: Did the patient need help bathing, dressing, using the toilet or eating?  Independent  Indoor Mobility: Did the patient need assistance with walking from room to room (with or without device)? Independent  Stairs: Did the patient need assistance with internal or external stairs (with or without device)? Independent  Functional Cognition: Did the patient need help planning regular tasks such as shopping or remembering to take medications? Independent  Current Functional Level Cognition  Arousal/Alertness: Awake/alert Overall Cognitive Status: Difficult to assess Difficult to assess due  to: Impaired communication Orientation Level: Oriented X4 Safety/Judgement: Decreased awareness of safety, Decreased awareness of deficits General Comments: Responds well to verbal cues and was very receptive of safety instructions once educated. Attention: Selective Selective Attention: Appears intact Memory: Appears intact Awareness: Appears intact Behaviors: Poor frustration tolerance Safety/Judgment: Appears intact    Extremity Assessment (includes Sensation/Coordination)  Upper Extremity Assessment: Defer to OT evaluation RUE Deficits / Details: able to move whole arm with increased time and effort (both FM and GM involvement) RUE Coordination: decreased fine motor, decreased gross motor  Lower Extremity Assessment: RLE deficits/detail RLE Deficits / Details: Excellent strength throughout BIL LEs grossly with muscle testing however demonstrates notable ataxia with functional tasks including sit<>stand and gait.  RLE Sensation: decreased proprioception RLE Coordination: decreased gross motor    ADLs  Overall ADL's : Needs assistance/impaired Eating/Feeding: Set up, Supervision/ safety Eating/Feeding Details (indicate cue type and reason): full S  for meals due to impulsivity per SLP and pocketing Grooming: Moderate assistance, Sitting Upper Body Bathing: Moderate assistance, Sitting Lower Body Bathing: Maximal assistance Lower Body Bathing Details (indicate cue type and reason): MOd A sit<>stand, Max A to maintain standing Upper Body Dressing : Moderate assistance, Sitting Lower Body Dressing: Moderate assistance Lower Body Dressing Details (indicate cue type and reason): MOd A sit<>stand, Max A to maintain standing Toilet Transfer: Maximal assistance, Stand-pivot Toilet Transfer Details (indicate cue type and reason): blocking right knee Toileting- Clothing Manipulation and Hygiene: Maximal assistance Toileting - Clothing Manipulation Details (indicate cue type and reason): MOd A sit<>stand, Max A to maintain standing    Mobility  Overal bed mobility: Needs Assistance Bed Mobility: Supine to Sit, Sit to Supine Supine to sit: Min assist Sit to supine: Min guard General bed mobility comments: Min assist for truncal support and balance while rising to EOB for safety as well. Min guard with return to bed, using VC for technique.    Transfers  Overall transfer level: Needs assistance Equipment used: Rolling walker (2 wheeled) Transfers: Sit to/from Stand Sit to Stand: Mod assist Stand pivot transfers: Max assist General transfer comment: Mod assist for balance with rising from bed x5 and chair with arm rests x2. Tactile cues for UE placement. Verbal cues for hand placement and safety. Progressed to a min assist level from bed.    Ambulation / Gait / Stairs / Wheelchair Mobility  Ambulation/Gait Ambulation/Gait assistance: Mod assist Ambulation Distance (Feet): 15 Feet (x4) Assistive device: Rolling walker (2 wheeled) Gait Pattern/deviations: Step-to pattern, Decreased stride length, Decreased stance time - right, Decreased dorsiflexion - right, Ataxic, Drifts right/left, Narrow base of support General Gait Details: Able to  ambulate several short distance bouts today with mod assist for balance, demonstrating signficant ataxia, specifically noteable with RLE. Responds well to verbal cues but requires assist with walker navigation, and sequencing of gait including RLE foot placement. Gait velocity: decreased Gait velocity interpretation: <1.8 ft/sec, indicative of risk for recurrent falls    Posture / Balance Dynamic Sitting Balance Sitting balance - Comments: initially requiring UE support but progressed to unsupported sitting, able to dynamically move LEs and shift weight, difficulty with Rt weight shift and return to midline Balance Overall balance assessment: Needs assistance Sitting-balance support: Feet supported, No upper extremity supported Sitting balance-Leahy Scale: Fair Sitting balance - Comments: initially requiring UE support but progressed to unsupported sitting, able to dynamically move LEs and shift weight, difficulty with Rt weight shift and return to midline Standing balance support: Single extremity supported Standing balance-Leahy Scale: Poor  Standing balance comment: Able hold RW with 1 UE for short periods of time statically    Special needs/care consideration BiPAP/CPAP  N/a CPM  N/a Continuous Drip IV n/a Dialysis  N/a Life Vest  N/a Oxygen  N/a Special Bed  N/a Trach Size  N/a Wound Vac n/a Skin intact Bowel mgmt: LBM 9/27, continent Bladder mgmt: continent urinal with mod assist Diabetic mgmt Hgb A1c 8.9 Noncompliant with meds pta History of ETOH, tobacco and drug abuse   Previous Home Environment Living Arrangements:  (lives with [name] at family home)  Lives With: Family ([place] and cousin [name]) Available Help at Discharge: Family, Available 24 hours/day Type of Home:  (lives in "little house" behind [place] "big House") Home Layout: One level Home Access: Level entry Bathroom Shower/Tub: Other (comment) (has 1/2 bath in "little house". goes to  Aunt's home next doo) Bathroom Toilet: Standard Bathroom Accessibility: Yes How Accessible: Accessible via walker [place]: No Additional Comments: denied disability after past CVA  Discharge Living Setting Plans for Discharge Living Setting: Lives with (comment) (lives in [place] next to his Aunt's home) Type of Home at Discharge: House Discharge Home Layout: One level Discharge Home Access: Level entry Discharge Bathroom Shower/Tub: Tub/shower unit, Curtain (pt's house has just 1/2 bath; goes to Aunt's home to shower) Discharge Bathroom Toilet: Standard Discharge Bathroom Accessibility: Yes How Accessible: Accessible via walker Does the patient have any problems obtaining your medications?: Yes (Describe) (uninsured)  Social/Family/Support Systems Patient Roles:  (not married, no children) Contact Information: [name] is his main contact, not his brother or sister Anticipated Caregiver: [name]'s son, [name], will be main caregiver Elenor Legato [place] Derry will not come to hospital due to her cancer) Anticipated Caregiver's Contact Information: see above Ability/Limitations of Caregiver: Elenor Legato has Cancer; [name] unemployed; pt's girlfriend, Synthia Innocent, is involved Caregiver Availability: 24/7 Discharge Plan Discussed with Primary Caregiver: Yes Is Caregiver In Agreement with Plan?: Yes Does Caregiver/Family have Issues with Lodging/Transportation while Pt is in Rehab?: No Hulda Marin, [name] and his sister and brother likely to stay w)  Goals/Additional Needs Patient/Family Goal for Rehab: Mod I to supervision with PT and OT, supervision to min assist with SLP Expected length of stay: [facility]OS 7- 12 days Equipment Needs: severe communication issues Pt/Family Agrees to Admission and willing to participate: Yes Program Orientation Provided & Reviewed with Pt/Caregiver Including Roles  & Responsibilities: Yes  Barriers to Discharge: Medication compliance  Decrease burden of  Care through IP rehab admission: n/s  Possible need for SNF placement upon discharge:not anticipated  Patient Condition: This patient's condition remains as documented in the consult dated 10/25/2016, in which the Rehabilitation Physician determined and documented that the patient's condition is appropriate for intensive rehabilitative care in an inpatient rehabilitation facility. Will admit to inpatient rehab today.  Preadmission Screen Completed By:  Cleatrice Burke, 10/27/2016 12:49 PM ______________________________________________________________________   Discussed status with Dr. Posey Pronto on 10/27/2016 at  1248 and received telephone approval for admission today.  Admission Coordinator:  Cleatrice Burke, time 1007 Date 10/27/2016       Cosigned by: Jamse Arn, MD at 10/27/2016 1:02 PM  Revision History

## 2016-10-27 NOTE — Progress Notes (Signed)
Physical Therapy Treatment Patient Details Name: Eric Morton MRN: [id number] DOB: [birth year] Today's Date: 10/27/2016    History of Present Illness Eric Morton is a 51 y.o. male presenting with right sided weakness and aphasia. PMH is significant for CKD, HTN, HLD, polysubstance abuse, polycythemia vera, DM2. MRI Numerous acute supra- and infratentorial infarcts, largest measuring 18 x 7 mm LEFT basal ganglia. Distribution suggests embolic etiology. 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     PT Comments    Pt progressing slowly but steadily.  Emphasis today on use of R UE for each transition and gait, sit to stand, pre-gait and gait stability and quality.  A good rehab candidate.    Follow Up Recommendations  CIR     Equipment Recommendations  None recommended by PT    Recommendations for Other Services Rehab consult     Precautions / Restrictions Precautions Precautions: Fall    Mobility  Bed Mobility               General bed mobility comments: EOB on arrival  Transfers Overall transfer level: Needs assistance Equipment used: Rolling walker (2 wheeled) Transfers: Sit to/from Stand Sit to Stand: Mod assist;+2 physical assistance;+2 safety/equipment Stand pivot transfers: Mod assist;+2 physical assistance       General transfer comment: assist for coming forward, stability and assisting with R UE  Ambulation/Gait Ambulation/Gait assistance: Mod assist;+2 physical assistance Ambulation Distance (Feet): 40 Feet (x2) Assistive device: Rolling walker (2 wheeled) Gait Pattern/deviations: Step-through pattern;Step-to pattern;Decreased step length - right;Decreased step length - left;Decreased stance time - right;Decreased stride length Gait velocity: decreased Gait velocity interpretation: Below normal speed for age/gender General Gait Details: Working to equalize step length, improve heel toe pattern.  Assist needed consistently for w/shift and hold to the left.  Generally pt's gait is typical hemiparetic gait.   Stairs            Wheelchair Mobility    Modified Rankin (Stroke Patients Only) Modified Rankin (Stroke Patients Only) Modified Rankin: Moderately severe disability     Balance Overall balance assessment: Needs assistance Sitting-balance support: Feet supported;No upper extremity supported Sitting balance-Leahy Scale: Fair     Standing balance support: Bilateral upper extremity supported;Single extremity supported Standing balance-Leahy Scale: Fair Standing balance comment: balanced without assist and use of at least 1 UE                            Cognition Arousal/Alertness: Awake/alert Behavior During Therapy: WFL for tasks assessed/performed (easily frustrated) Overall Cognitive Status: Difficult to assess                               Problem Solving: Requires verbal cues;Requires tactile cues        Exercises      General Comments        Pertinent Vitals/Pain Pain Assessment: Faces Faces Pain Scale: No hurt    Home Living   Living Arrangements:  (lives with Eric Morton at family home)   Type of Home:  (lives in "little house" behind Eric Morton's "big House")         Additional Comments: denied disability after past CVA    Prior Function        Comments: H/o CVA   PT Goals (current goals can now be found in the care plan section) Acute Rehab PT Goals Patient Stated Goal: Go to rehab PT  Goal Formulation: With patient Time For Goal Achievement: 11/08/16 Potential to Achieve Goals: Good Progress towards PT goals: Progressing toward goals    Frequency    Min 4X/week      PT Plan Current plan remains appropriate    Co-evaluation PT/OT/SLP Co-Evaluation/Treatment: Yes Reason for Co-Treatment: Complexity of the patient's impairments (multi-system involvement) PT goals addressed during session: Mobility/safety with mobility        AM-PAC PT "6 Clicks"  Daily Activity  Outcome Measure  Difficulty turning over in bed (including adjusting bedclothes, sheets and blankets)?: Unable Difficulty moving from lying on back to sitting on the side of the bed? : Unable Difficulty sitting down on and standing up from a chair with arms (e.g., wheelchair, bedside commode, etc,.)?: Unable Help needed moving to and from a bed to chair (including a wheelchair)?: A Lot Help needed walking in hospital room?: A Little Help needed climbing 3-5 steps with a railing? : A Lot 6 Click Score: 10    End of Session Equipment Utilized During Treatment: Gait belt Activity Tolerance: Patient tolerated treatment well Patient left: in bed;with call bell/phone within reach;with bed alarm set;with family/visitor present Nurse Communication:  ( with ?brother in [place] assist) PT Visit Diagnosis: Unsteadiness on feet (R26.81);Other abnormalities of gait and mobility (R26.89);Hemiplegia and hemiparesis Hemiplegia - Right/Left: Right Hemiplegia - dominant/non-dominant: Dominant Hemiplegia - caused by: Cerebral infarction     Time: 1400-1429 PT Time Calculation (min) (ACUTE ONLY): 29 min  Charges:  $Gait Training: 8-22 mins                    G Codes:       2016/11/25  Donnella Sham, PT [phone]  (pager)   Tessie Fass Breyson Kelm November 25, 2016, 3:27 PM

## 2016-10-28 ENCOUNTER — Inpatient Hospital Stay (HOSPITAL_COMMUNITY): Payer: No Typology Code available for payment source

## 2016-10-28 ENCOUNTER — Inpatient Hospital Stay (HOSPITAL_COMMUNITY): Payer: No Typology Code available for payment source | Admitting: Occupational Therapy

## 2016-10-28 DIAGNOSIS — G8191 Hemiplegia, unspecified affecting right dominant side: Secondary | ICD-10-CM

## 2016-10-28 DIAGNOSIS — I6310 Cerebral infarction due to embolism of unspecified precerebral artery: Secondary | ICD-10-CM

## 2016-10-28 LAB — CBC WITH DIFFERENTIAL/PLATELET
BASOS ABS: 0 10*3/uL (ref 0.0–0.1)
Basophils Relative: 0 %
EOS PCT: 7 %
Eosinophils Absolute: 0.4 10*3/uL (ref 0.0–0.7)
HEMATOCRIT: 39.7 % (ref 39.0–52.0)
HEMOGLOBIN: 12.8 g/dL — AB (ref 13.0–17.0)
LYMPHS ABS: 2.3 10*3/uL (ref 0.7–4.0)
LYMPHS PCT: 43 %
MCH: 28.3 pg (ref 26.0–34.0)
MCHC: 32.2 g/dL (ref 30.0–36.0)
MCV: 87.6 fL (ref 78.0–100.0)
Monocytes Absolute: 0.6 10*3/uL (ref 0.1–1.0)
Monocytes Relative: 11 %
NEUTROS ABS: 2.1 10*3/uL (ref 1.7–7.7)
NEUTROS PCT: 39 %
Platelets: 137 10*3/uL — ABNORMAL LOW (ref 150–400)
RBC: 4.53 MIL/uL (ref 4.22–5.81)
RDW: 13.3 % (ref 11.5–15.5)
WBC: 5.4 10*3/uL (ref 4.0–10.5)

## 2016-10-28 LAB — COMPREHENSIVE METABOLIC PANEL
ALT: 26 U/L (ref 17–63)
AST: 47 U/L — AB (ref 15–41)
Albumin: 2.7 g/dL — ABNORMAL LOW (ref 3.5–5.0)
Alkaline Phosphatase: 68 U/L (ref 38–126)
Anion gap: 9 (ref 5–15)
BUN: 11 mg/dL (ref 6–20)
CHLORIDE: 105 mmol/L (ref 101–111)
CO2: 24 mmol/L (ref 22–32)
CREATININE: 1.03 mg/dL (ref 0.61–1.24)
Calcium: 8.7 mg/dL — ABNORMAL LOW (ref 8.9–10.3)
GFR calc non Af Amer: >60 mL/min (ref >60)
Glucose, Bld: 116 mg/dL — ABNORMAL HIGH (ref 65–99)
Potassium: 3.6 mmol/L (ref 3.5–5.1)
Sodium: 138 mmol/L (ref 135–145)
TOTAL PROTEIN: 5.9 g/dL — AB (ref 6.5–8.1)
Total Bilirubin: 0.8 mg/dL (ref 0.3–1.2)

## 2016-10-28 LAB — GLUCOSE, CAPILLARY
GLUCOSE-CAPILLARY: 93 mg/dL (ref 65–99)
Glucose-Capillary: 180 mg/dL — ABNORMAL HIGH (ref 65–99)
Glucose-Capillary: 160 mg/dL — ABNORMAL HIGH (ref 65–99)
Glucose-Capillary: 161 mg/dL — ABNORMAL HIGH (ref 65–99)

## 2016-10-28 MED ORDER — SERTRALINE HCL 50 MG PO TABS
25.0000 mg | ORAL_TABLET | Freq: Every day | ORAL | Status: DC
  Administered 2016-10-28 – 2016-11-29 (×34): 25 mg via ORAL
  Filled 2016-10-28 (×33): qty 1

## 2016-10-28 MED ORDER — MUSCLE RUB 10-15 % EX CREA
TOPICAL_CREAM | CUTANEOUS | Status: DC | PRN
  Administered 2016-10-28: 1 via TOPICAL
  Filled 2016-10-28: qty 85

## 2016-10-28 MED ORDER — MAGIC MOUTHWASH W/LIDOCAINE
5.0000 mL | Freq: Three times a day (TID) | ORAL | Status: DC
  Administered 2016-10-28 – 2016-11-06 (×18): 5 mL via ORAL
  Filled 2016-10-28 (×24): qty 5

## 2016-10-28 MED ORDER — SALINE SPRAY 0.65 % NA SOLN
1.0000 | NASAL | Status: DC | PRN
  Administered 2016-10-28 – 2016-11-23 (×4): 1 via NASAL
  Filled 2016-10-28: qty 44

## 2016-10-28 NOTE — Progress Notes (Signed)
CSW notes that patient is discharging to CIR.  CSW signing off.  Percell Locus Jenna Ardoin LCSWA [phone]

## 2016-10-28 NOTE — Evaluation (Signed)
Occupational Therapy Assessment and Plan  Patient Details  Name: Eric Morton MRN: [id number] Date of Birth: 05/17/1965  OT Diagnosis: abnormal posture, ataxia, cognitive deficits, flaccid hemiplegia and hemiparesis and muscle weakness (generalized) Rehab Potential: Rehab Potential (ACUTE ONLY): Good ELOS: 2.5-3 weeks   Today's Date: 10/28/2016 OT Individual Time: 1030-1200 OT Individual Time Calculation (min): 90 min     Problem List:  Patient Active Problem List   Diagnosis Date Noted  . 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 Alcohol use   . Cocaine use   . Dysarthria   . Oropharyngeal dysphagia   . Left-sided weakness 10/23/2016  . Cerebral thrombosis with cerebral infarction 10/23/2016  . Aphasia   . Hyperlipidemia 07/27/2015  . Cerebrovascular accident (CVA) due to occlusion of cerebral artery ([place])   . Stroke ([place]) 03/21/2015  . Polycythemia vera ([place]) 03/21/2015  . Hx of adenomatous colonic polyps   . Dehydration   . Diarrhea   . Protein-calorie malnutrition ([place])   . Cellulitis of breast of male 09/25/2011  . HTN (hypertension) 09/25/2011  . DM (diabetes mellitus) ([place]) 09/25/2011  . Leukopenia 09/25/2011  . Alcohol abuse 09/25/2011  . Cocaine abuse ([place]) 09/25/2011  . Tobacco use 09/25/2011  . Neuropathy 09/25/2011    Past Medical History:  Past Medical History:  Diagnosis Date  . 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 Anxiety   . Chronic kidney disease    "jsut a little damage"  . Glaucoma   . History of stomach ulcers    not confirmed, never underwent diagnostic endo or radiology, this manifested as epigastric distress.   [name] Hx of adenomatous colonic polyps   . Hypercholesterolemia   . Hypertension   . Peripheral neuropathy   . Polycythemia vera ([place]) 2006  . Substance abuse ([place])   . Type II diabetes mellitus ([place])    Past Surgical History:  Past Surgical History:  Procedure Laterality Date  . COLONOSCOPY WITH  PROPOFOL N/A 04/28/2014   Procedure: COLONOSCOPY WITH PROPOFOL;  Surgeon: Gatha Mayer, MD;  Location: [facility];  Service: Endoscopy;  Laterality: N/A;  . IRRIGATION AND DEBRIDEMENT ABSCESS  09/26/2011   Procedure: IRRIGATION AND DEBRIDEMENT ABSCESS;  Surgeon: Imogene Burn. Georgette Dover, MD;  Location: [facility];  Service: General;  Laterality: Left;  Incision and Drainage left breast abscess    Assessment & Plan Clinical Impression: Eric Morton a 51 y.o.right handedmalewith history of polysubstance abuse,documented CKD with creatinine 1.02, HTN,polycythemia vera, diabetes mellitus and prior CVA in 2017 and right side residual deficits. Per chart review, patient lives alone in an apartment connected to his aunt's home. Independent prior to admission. He has a cousin with good support. Presented on 10/23/16 with right hemiparesis and dysarthria. Work up revealed UDS + for cocaine. BP elevated into the 200'/100's requiring IV labetalol. CT cerebral perfusion scan did not detect for infarction. CT angiogram head and neck negative for emergent large vessel occlusion. No significant carotid or right vertebral artery stenosis in the neck despite atherosclerosis. 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 MRI of the brain reviewed, showing multiple CVAs. Per report, numerous infarcts including a large 18x39m left basal ganglia infarct suggestive of an embolic etiology. Echocardiogram with ejection fraction of 611%grade 1 diastolic dysfunction. Bilateral lower extremity Dopplers negative. Pt on D1/honey thick liquid dit currently.  Currently maintained on aspirin and Plavix for CVA prophylaxis. Subcutaneous Lovenox for DVT prophylaxis. Hemoglobin  A1c of 8.9.Patient was admitted for a comprehensive rehabilitation program  Patient transferred to CIR on 10/27/2016 .    Patient currently requires max with basic self-care skills secondary to muscle weakness, decreased cardiorespiratoy  endurance, abnormal tone, motor apraxia, ataxia and decreased coordination, decreased problem solving, decreased safety awareness and decreased memory and decreased sitting balance, decreased standing balance, decreased postural control, hemiplegia and decreased balance strategies.  Prior to hospitalization, patient could complete ADLs with independent .  Patient will benefit from skilled intervention to decrease level of assist with basic self-care skills and increase independence with basic self-care skills prior to discharge home with care partner.  Anticipate patient will require minimal physical assistance and follow up home health.      Skilled Therapeutic Intervention Pt seen for OT eval and ADL bathing/dressing session. Pt sitting up in recliner upon arrival with sister present. Pt's sister provided PLOF information.  Completed bathing/dressing from w/c level at sink, education pprovided regarding technique. Pt required total A hand over hand assist to use R UE to wash L UE. HE was able to recall hemi dressing techniques from donning underwear to donning pants though unable to carry over to UB dressing. He stood at sink with mod-max A to power up and steadying assist for balance with L UE support while therapist assisted with buttock hygiene and clothing management.  He completed x 2 w/c <> toilet via stand pivot to turn to R for toilet with mod-max A due to ataxic movements with R LE. Min A squat pivot for returning to stronger L side via squat pivot.  Trialed STEDY for nursing use, pt stood in [place] with mod A and able to maintain midline sitting orientation with supervision- safety plan updated. Pt left seated in w/c at end of session with sister and RN present. Pt refusing QRB, educated regarding fall risk and need to call for assist with any mobility.   Education provided throughout session regarding role of OT, POC, CIR, continuum of care, OT goals, ELOS, and d/c planning.   OT  Evaluation Precautions/Restrictions  Precautions Precautions: Fall Restrictions Weight Bearing Restrictions: No General Chart Reviewed: Yes Vital Signs  Pain Pain Assessment Pain Assessment: 0-10 Pain Score: No/ denies pain Home Living/Prior Functioning Home Living Family/patient expects to be discharged to:: Private residence Living Arrangements: Other relatives Available Help at Discharge: Family Type of Home: Other(Comment) (Lives in apartment connected to aunt's house. ) Additional Comments: Pt's sister present at eval and reports that she does not want him to return to previous living location. She is willing to have pt live with her, however, she lives on second level apartment  Lives With: Family Engineer, petroleum and cousin) IADL History Homemaking Responsibilities: No Current License: No Mode of Transportation: Walk Education: Sister reports pt graduated from high school Vision Baseline Vision/History: Glaucoma Patient Visual Report: No change from baseline Vision Assessment?: No apparent visual deficits Additional Comments: No visual deficits observed, unable to complete formal testing due to cognitive and laungauge deficits Perception  Perception: Within Functional Limits Praxis Praxis: Intact Cognition Overall Cognitive Status: Difficult to assess Arousal/Alertness: Awake/alert Year: 2018 Month: October Day of Week: Incorrect Memory: Appears intact Memory Recall: Sock;Blue Memory Recall Sock: Without Cue Memory Recall Blue: Without Cue Attention: Selective Selective Attention: Appears intact Awareness: Appears intact Behaviors: Poor frustration tolerance;Other (comment);Lability;Impulsive Safety/Judgment: Appears intact Sensation Sensation Light Touch: Impaired Detail Light Touch Impaired Details: Impaired RUE Proprioception: Impaired Detail Proprioception Impaired Details: Impaired RUE Coordination Gross Motor Movements are Fluid and  Coordinated: No Fine  Motor Movements are Fluid and Coordinated: No Coordination and Movement Description: L Hemiplegia UE>LE Finger Nose Finger Test: WFL L UE, unable to assess R UE Motor  Motor Motor: Hemiplegia Motor - Skilled Clinical Observations: R hemiplegia UE>LE Trunk/Postural Assessment  Cervical Assessment Cervical Assessment: Within Functional Limits Thoracic Assessment Thoracic Assessment: Exceptions to [facility] (Kyphotic) Lumbar Assessment Lumbar Assessment: Exceptions to [facility] (Posterior pelvic tilt) Postural Control Postural Control: Deficits on evaluation  Balance Balance Balance Assessed: Yes Static Sitting Balance Static Sitting - Balance Support: Feet supported;Left upper extremity supported Static Sitting - Level of Assistance: 5: Stand by assistance Static Sitting - Comment/# of Minutes: Sitting on toilet Dynamic Sitting Balance Dynamic Sitting - Balance Support: During functional activity;Feet supported;Left upper extremity supported Dynamic Sitting - Level of Assistance: 4: Min assist Static Standing Balance Static Standing - Balance Support: During functional activity;Left upper extremity supported Static Standing - Level of Assistance: 4: Min assist;3: Mod assist Static Standing - Comment/# of Minutes: Standing at sink Dynamic Standing Balance Dynamic Standing - Balance Support: During functional activity;Left upper extremity supported Dynamic Standing - Level of Assistance: 4: Min assist;3: Mod assist Dynamic Standing - Comments: Standing for LB dressing and bathing tasks to be completed Extremity/Trunk Assessment RUE Assessment RUE Assessment: Exceptions to [facility] RUE Strength RUE Overall Strength: Deficits (0/5 wrist and hand, trace elbow flexion gravity eliminated) LUE Assessment LUE Assessment: Within Functional Limits   See Function Navigator for Current Functional Status.   Refer to Care Plan for Long Term Goals  Recommendations for other services:  Neuropsych   Discharge Criteria: Patient will be discharged from OT if patient refuses treatment 3 consecutive times without medical reason, if treatment goals not met, if there is a change in medical status, if patient makes no progress towards goals or if patient is discharged from hospital.  The above assessment, treatment plan, treatment alternatives and goals were discussed and mutually agreed upon: by patient  Ernestina Patches 10/28/2016, 12:55 PM

## 2016-10-28 NOTE — Care Management Note (Signed)
[facility] Individual Statement of Services  Patient Name:  Eric Morton  Date:  10/28/2016  Welcome to the [facility].  Our goal is to provide you with an individualized program based on your diagnosis and situation, designed to meet your specific needs.  With this comprehensive rehabilitation program, you will be expected to participate in at least 3 hours of rehabilitation therapies Monday-Friday, with modified therapy programming on the weekends.  Your rehabilitation program will include the following services:  Physical Therapy (PT), Occupational Therapy (OT), Speech Therapy (ST), 24 hour per day rehabilitation nursing, Neuropsychology, Case Management (Social Worker), Rehabilitation Medicine, Nutrition Services and Pharmacy Services  Weekly team conferences will be held on Wednesday to discuss your progress.  Your Social Worker will talk with you frequently to get your input and to update you on team discussions.  Team conferences with you and your family in attendance may also be held.  Expected length of stay: 2.5-3 weeks  Overall anticipated outcome: supervision-min assist level  Depending on your progress and recovery, your program may change. Your Social Worker will coordinate services and will keep you informed of any changes. Your Social Worker's name and contact numbers are listed  below.  The following services may also be recommended but are not provided by the [facility]:    [place] will be made to provide these services after discharge if needed.  Arrangements include referral to agencies that provide these services.  Your insurance has been verified to be:  Pending Medicaid Your primary doctor is:  Arnoldo Morale  Pertinent information will be shared with your doctor and your insurance company.  Social Worker:  Ovidio Kin, [place] or ([phone]  Information discussed with and copy given to patient by: Elease Hashimoto, 10/28/2016, 10:31 AM

## 2016-10-28 NOTE — Evaluation (Addendum)
Speech Language Pathology Assessment and Plan  Patient Details  Name: Eric Morton MRN: [id number] Date of Birth: 19-Nov-1965  SLP Diagnosis: Dysarthria;Cognitive Impairments;Speech and Language deficits  Rehab Potential: Good ELOS: 2.5-3 weeks    Today's Date: 10/28/2016 SLP Individual Time: 384-665 ( 71 minutes)     Problem List:  Patient Active Problem List   Diagnosis Date Noted  . 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 Alcohol use   . Cocaine use   . Dysarthria   . Oropharyngeal dysphagia   . Left-sided weakness 10/23/2016  . Cerebral thrombosis with cerebral infarction 10/23/2016  . Aphasia   . Hyperlipidemia 07/27/2015  . Cerebrovascular accident (CVA) due to occlusion of cerebral artery ([place])   . Stroke ([place]) 03/21/2015  . Polycythemia vera ([place]) 03/21/2015  . Hx of adenomatous colonic polyps   . Dehydration   . Diarrhea   . Protein-calorie malnutrition ([place])   . Cellulitis of breast of male 09/25/2011  . HTN (hypertension) 09/25/2011  . DM (diabetes mellitus) ([place]) 09/25/2011  . Leukopenia 09/25/2011  . Alcohol abuse 09/25/2011  . Cocaine abuse ([place]) 09/25/2011  . Tobacco use 09/25/2011  . Neuropathy 09/25/2011   Past Medical History:  Past Medical History:  Diagnosis Date  . 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 Anxiety   . Chronic kidney disease    "jsut a little damage"  . Glaucoma   . History of stomach ulcers    not confirmed, never underwent diagnostic endo or radiology, this manifested as epigastric distress.   [name] Hx of adenomatous colonic polyps   . Hypercholesterolemia   . Hypertension   . Peripheral neuropathy   . Polycythemia vera ([place]) 2006  . Substance abuse ([place])   . Type II diabetes mellitus ([place])    Past Surgical History:  Past Surgical History:  Procedure Laterality Date  . COLONOSCOPY WITH PROPOFOL N/A 04/28/2014   Procedure: COLONOSCOPY WITH PROPOFOL;  Surgeon: Gatha Mayer, MD;  Location: [facility];  Service: Endoscopy;  Laterality: N/A;  . IRRIGATION AND DEBRIDEMENT ABSCESS  09/26/2011   Procedure: IRRIGATION AND DEBRIDEMENT ABSCESS;  Surgeon: Imogene Burn. Georgette Dover, MD;  Location: [facility];  Service: General;  Laterality: Left;  Incision and Drainage left breast abscess    Assessment / Plan / Recommendation Clinical Impression Eric Morton a 51 y.o.right handedmalewith history of polysubstance abuse,documented CKD with creatinine 1.02, HTN,polycythemia vera, diabetes mellitus and prior CVA in 2017 and right side residual deficits. Per chart review, patient lives alone in an apartment connected to his aunt's home. Independent prior to admission. He has a cousin with good support. Presented on 10/23/16 with right hemiparesis and dysarthria. Work up revealed UDS + for cocaine. BP elevated into the 200'/100's requiring IV labetalol. CT cerebral perfusion scan did not detect for infarction. CT angiogram head and neck negative for emergent large vessel occlusion. No significant carotid or right vertebral artery stenosis in the neck despite atherosclerosis. 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 MRI of the brain reviewed, showing multiple CVAs. Per report, numerous infarcts including a large 18x25m left basal ganglia infarct suggestive of an embolic etiology. Echocardiogram with ejection fraction of 699%grade 1 diastolic dysfunction. Bilateral lower extremity Dopplers negative. Pt on D1/honey thick liquid dit currently. Currently maintained on aspirin and Plavix for CVA prophylaxis. Subcutaneous Lovenox for DVT prophylaxis. Hemoglobin A1c of 8.9.Patient was admitted for a comprehensive rehabilitation program  Patient transferred  to CIR on 10/27/2016 .    Pt was evaluated on 10/28/2016 to assess swallow ability and cognitive-lingustic skills. Pt presents with severe impairment in swallowing lacking overall oral control including reduced labial seal, reduced  lingual ROM, reduce control of bolus and oral holding leading to premature spillage of liquids. Pt demonstrates need for multiple swallows during PO trials on solids and liquids. Pt demonstrates overt s/s aspiration coughing during and without PO consumption, sugessting premature spillage of own secretions. Pt demonstrates hyponasal and wet vocal quality Pt eliminated wet vocal quality with effective throat clear and requires maximum effort for volitional swallow. Pt presented with reduced labial seal, oral holding,and multiple swallows during trials of honey thick liquids, and nectar thick liquids via Tsp and cup sips with wet vocal quality eliminated following second swallow. Pt demonstrated immediate cough with ice chips, suggesting impaired oral control.  Pt presented with reduced labial seal, oral holding, min oral residue ,and multiple swallows during trials of  Dys 1 and Dy 2. SLP recommends current diet of Dys 1 and honey thick liquids with trials of nectar thick liquids and dys 2 for diet advancement. SLP recommends swallow strategies including pressing lips together, multiple swallows, lingual sweeps and intermittent throat clear.Pt presents with severe dysarthria and unable to effectively communication verbally requiring use of communication board. Pt demonstrated ability to gesture and utilize communication board at word level, spelling out word, requiring Mod A multimodal cues for clarification and to slow rate in order to increase effective communication. Pt presents with moderate cognitive impairments in recall of new information, problem solving, emergent awareness and selective attention.  SLP was only able to assess cognitive abilities informally and require further assessment for a more accurate depiction of pt's abilities. Pt would benefit from skilled ST services to address swallowing, communication and cognitive abilities in order to maximumize independence and reduce burden of care.     Skilled Therapeutic Interventions          Skilled ST services focused on cognitive, communication and swallow abilities. SLP facilitated PO consumption of current diet and trails of nectar thick liquids and Dys 2 requiring Max A verbal cues to following swallow strategies. SLP facilitated communication utilizing communication board requiring Max A verbal and visual cues at phrase level of communication message. Pt was left in room with nurse tech. Recommend to continue Deysi Soldo services.    SLP Assessment  Patient will need skilled Speech Lanaguage Pathology Services during CIR admission    Recommendations  Medication Administration: Crushed with puree Supervision: Staff to assist with self feeding;Full supervision/cueing for compensatory strategies Compensations: Minimize environmental distractions;Slow rate;Small sips/bites;Lingual sweep for clearance of pocketing;Multiple dry swallows after each bite/sip Postural Changes and/or Swallow Maneuvers: Seated upright 90 degrees Recommendations for Other Services: Neuropsych consult Patient destination: Home Follow up Recommendations: 24 hour supervision/assistance;Skilled Nursing facility;Outpatient SLP;Home Health SLP Equipment Recommended: None recommended by SLP    SLP Frequency 3 to 5 out of 7 days   SLP Duration  SLP Intensity  SLP Treatment/Interventions 2.5-3 weeks  Minumum of 1-2 x/day, 30 to 90 minutes  Cognitive remediation/compensation;Cueing hierarchy;Functional tasks;Dysphagia/aspiration precaution training;Patient/family education;Speech/Language facilitation    Pain    Prior Functioning Cognitive/Linguistic Baseline: Baseline deficits Type of Home: Apartment  Lives With: Family Available Help at Discharge: Family Education: Sister reports pt graduated from high school Vocation: Unemployed  Function:  Eating Eating                 Cognition Comprehension    Expression  Social Interaction    Problem  Solving    Memory     Short Term Goals: Week 1: SLP Short Term Goal 1 (Week 1): Pt will consume current diet with Min s/s asiration with Mod A verbal cues for use of swallowing compensatory strategies. SLP Short Term Goal 2 (Week 1): Pt will tolerate trials of nectar thick liquids with use of compensatory strategies with Min s/s aspiration over 3 observed sessions to demonstrate readiness for diet advancement. SLP Short Term Goal 3 (Week 1): Pt will tolerate trials of Dys 2 with use of compensatory strategies with Min s/s aspiration and oral residue over 3 observed sessions to demonstrate readiness for diet advancement. SLP Short Term Goal 4 (Week 1): Pt will demonstrate effective use of communication board to express wants/needs at word level with Min A multimodal cues.  SLP Short Term Goal 5 (Week 1): Pt will demonstrate functional problem solving for basic and familiar tasks with Mod A verbal cues. SLP Short Term Goal 6 (Week 1): Pt will utilize speech intelligibility strategies at word level with Max A verbal cues to acheive 50% intelligibility.   Refer to Care Plan for Long Term Goals  Recommendations for other services: Neuropsych  Discharge Criteria: Patient will be discharged from SLP if patient refuses treatment 3 consecutive times without medical reason, if treatment goals not met, if there is a change in medical status, if patient makes no progress towards goals or if patient is discharged from hospital.  The above assessment, treatment plan, treatment alternatives and goals were discussed and mutually agreed upon: by patient  Elia Nunley  [facility] 10/28/2016, 5:17 PM

## 2016-10-28 NOTE — Progress Notes (Signed)
Patient information reviewed and entered into eRehab system by Finch Costanzo, RN, CRRN, PPS Coordinator.  Information including medical coding and functional independence measure will be reviewed and updated through discharge.    

## 2016-10-28 NOTE — Progress Notes (Addendum)
Subjective/Complaints: Very tearful labile, severe dysarthria, has difficulty using communication board   ROS- not able to obtain full review due to communication issues, with aid of SLP pt c/o Low back pain and he points to bed  Objective: Vital Signs: Blood pressure 136/76, pulse 73, temperature (!) 97.4 F (36.3 C), temperature source Oral, resp. rate 17, height '5\' 7"'  (1.702 m), weight 74.8 kg (165 lb), SpO2 99 %. No results found. Results for orders placed or performed during the hospital encounter of 10/27/16 (from the past 72 hour(s))  CBC     Status: Abnormal   Collection Time: 10/27/16  4:40 PM  Result Value Ref Range   WBC 4.8 4.0 - 10.5 K/uL   RBC 4.94 4.22 - 5.81 MIL/uL   Hemoglobin 13.9 13.0 - 17.0 g/dL   HCT 43.1 39.0 - 52.0 %   MCV 87.2 78.0 - 100.0 fL   MCH 28.1 26.0 - 34.0 pg   MCHC 32.3 30.0 - 36.0 g/dL   RDW 13.3 11.5 - 15.5 %   Platelets 144 (L) 150 - 400 K/uL  Creatinine, serum     Status: None   Collection Time: 10/27/16  4:40 PM  Result Value Ref Range   Creatinine, Ser 1.02 0.61 - 1.24 mg/dL   GFR calc non Af Amer >60 >60 mL/min   GFR calc Af Amer >60 >60 mL/min    Comment: (NOTE) The eGFR has been calculated using the CKD EPI equation. This calculation has not been validated in all clinical situations. eGFR's persistently <60 mL/min signify possible Chronic Kidney Disease.   Glucose, capillary     Status: Abnormal   Collection Time: 10/27/16  5:43 PM  Result Value Ref Range   Glucose-Capillary 108 (H) 65 - 99 mg/dL  Glucose, capillary     Status: Abnormal   Collection Time: 10/27/16  8:45 PM  Result Value Ref Range   Glucose-Capillary 131 (H) 65 - 99 mg/dL   Comment 1 Notify RN   CBC WITH DIFFERENTIAL     Status: Abnormal   Collection Time: 10/28/16  4:31 AM  Result Value Ref Range   WBC 5.4 4.0 - 10.5 K/uL   RBC 4.53 4.22 - 5.81 MIL/uL   Hemoglobin 12.8 (L) 13.0 - 17.0 g/dL   HCT 39.7 39.0 - 52.0 %   MCV 87.6 78.0 - 100.0 fL   MCH 28.3  26.0 - 34.0 pg   MCHC 32.2 30.0 - 36.0 g/dL   RDW 13.3 11.5 - 15.5 %   Platelets 137 (L) 150 - 400 K/uL   Neutrophils Relative % 39 %   Neutro Abs 2.1 1.7 - 7.7 K/uL   Lymphocytes Relative 43 %   Lymphs Abs 2.3 0.7 - 4.0 K/uL   Monocytes Relative 11 %   Monocytes Absolute 0.6 0.1 - 1.0 K/uL   Eosinophils Relative 7 %   Eosinophils Absolute 0.4 0.0 - 0.7 K/uL   Basophils Relative 0 %   Basophils Absolute 0.0 0.0 - 0.1 K/uL  Comprehensive metabolic panel     Status: Abnormal   Collection Time: 10/28/16  4:31 AM  Result Value Ref Range   Sodium 138 135 - 145 mmol/L   Potassium 3.6 3.5 - 5.1 mmol/L   Chloride 105 101 - 111 mmol/L   CO2 24 22 - 32 mmol/L   Glucose, Bld 116 (H) 65 - 99 mg/dL   BUN 11 6 - 20 mg/dL   Creatinine, Ser 1.03 0.61 - 1.24 mg/dL   Calcium 8.7 (  L) 8.9 - 10.3 mg/dL   Total Protein 5.9 (L) 6.5 - 8.1 g/dL   Albumin 2.7 (L) 3.5 - 5.0 g/dL   AST 47 (H) 15 - 41 U/L   ALT 26 17 - 63 U/L   Alkaline Phosphatase 68 38 - 126 U/L   Total Bilirubin 0.8 0.3 - 1.2 mg/dL   GFR calc non Af Amer >60 >60 mL/min   GFR calc Af Amer >60 >60 mL/min    Comment: (NOTE) The eGFR has been calculated using the CKD EPI equation. This calculation has not been validated in all clinical situations. eGFR's persistently <60 mL/min signify possible Chronic Kidney Disease.    Anion gap 9 5 - 15  Glucose, capillary     Status: None   Collection Time: 10/28/16  6:28 AM  Result Value Ref Range   Glucose-Capillary 93 65 - 99 mg/dL   Comment 1 Notify RN      HEENT: bilateral buccal surface laceration sm amt dried blood at left salivary duct stoma no active bleeding Cardio: RRR and no murmur Resp: CTA B/L and unlabored GI: BS positive and NT, ND Extremity:  No Edema Skin:   Intact Neuro: Abnormal Sensory difficult to assess due to dysarthria, Abnormal Motor 0/5 RUE trace elbow flex, 4- Left knee extensor, Dysarthric and Other emotional lability-pseudobulbar affect Musc/Skel:  Other mild  tenderness to palpation over lumbar paraspinals Gen NAD   Assessment/Plan: 1. Functional deficits secondary to Right hemiparesis , dysarthria, dysphagia which require 3+ hours per day of interdisciplinary therapy in a comprehensive inpatient rehab setting. Physiatrist is providing close team supervision and 24 hour management of active medical problems listed below. Physiatrist and rehab team continue to assess barriers to discharge/monitor patient progress toward functional and medical goals. FIM:       Function - Toileting Toileting steps completed by patient: Adjust clothing prior to toileting, Adjust clothing after toileting Toileting steps completed by helper: Performs perineal hygiene Toileting Assistive Devices: Toilet aid Assist level: Touching or steadying assistance (Pt.75%)           Function - Comprehension Comprehension: Auditory Comprehension assist level: Follows basic conversation/direction with extra time/assistive device  Function - Expression Expression: Nonverbal Expression assistive device: Communication board Expression assist level: Expresses basic 25 - 49% of the time/requires cueing 50 - 75% of the time. Uses single words/gestures.            Medical Problem List and Plan: 1.  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  DVT Prophylaxis/Anticoagulation: Subcutaneous Lovenox. Venous Doppler studies negative, PLT 137K monitor 3. Pain Management: Neurontin 300 mg daily at bedtime 4. Mood: Ego support 5. Neuropsych: This patient is capable of making decisions on his own behalf. 6. Skin/Wound Care:  Routine skin checks 7. Fluids/Electrolytes/Nutrition:  Routine I&O with follow-up chemistries 8. Dysphagia. Dysphagia #1 honey liquids. Monitor hydration. Follow-up speech  therapy 9.Hypertension. Lisinopril 5 mg daily- fair control cont to monitor Vitals:   10/27/16 1549 10/28/16 0500  BP: (!) 153/83 136/76  Pulse: 92 73  Resp: 18 17  Temp: 97.7 F (36.5 C) (!) 97.4 F (36.3 C)  SpO2: 100% 99%   10. Diabetes mellitus peripheral neuropathy. Hemoglobin A1c 8.9. SSI.Check blood sugars before meals and at bedtime. Patient on Glucotrol 5 mg twice a day, Glucophage 1000 mg twice a day. Resume as needed 11.Polysubstance  abuse. UDS positive for cocaine. Provide counseling with Neuropsych 12.History polycythemia. Follow-up labs 13. ?PBA: Monitor.  Will consider Neuropsych,  14.  Buccal surface laceration likely related to dyscoordinated oral movements, left side over salivary duct will schedule MMW LOS (Days) 1 A FACE TO FACE EVALUATION WAS PERFORMED  KIRSTEINS,ANDREW E 10/28/2016, 8:11 AM

## 2016-10-28 NOTE — Progress Notes (Signed)
Social Work Assessment and Plan Social Work Assessment and Plan  Patient Details  Name: Eric Morton MRN: [id number] Date of Birth: [birth year]  Today's Date: 10/28/2016  Problem List:  Patient Active Problem List   Diagnosis Date Noted  . Embolic cerebral infarction ([place]) 10/27/2016  . Alcohol use   . Cocaine use   . Dysarthria   . Oropharyngeal dysphagia   . Left-sided weakness 10/23/2016  . Cerebral thrombosis with cerebral infarction 10/23/2016  . Aphasia   . Hyperlipidemia 07/27/2015  . Cerebrovascular accident (CVA) due to occlusion of cerebral artery ([place])   . Stroke ([place]) 03/21/2015  . Polycythemia vera ([place]) 03/21/2015  . Hx of adenomatous colonic polyps   . Dehydration   . Diarrhea   . Protein-calorie malnutrition ([place])   . Cellulitis of breast of male 09/25/2011  . HTN (hypertension) 09/25/2011  . DM (diabetes mellitus) ([place]) 09/25/2011  . Leukopenia 09/25/2011  . Alcohol abuse 09/25/2011  . Cocaine abuse ([place]) 09/25/2011  . Tobacco use 09/25/2011  . Neuropathy 09/25/2011   Past Medical History:  Past Medical History:  Diagnosis Date  . 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 Anxiety   . Chronic kidney disease    "jsut a little damage"  . Glaucoma   . History of stomach ulcers    not confirmed, never underwent diagnostic endo or radiology, this manifested as epigastric distress.   [name] Hx of adenomatous colonic polyps   . Hypercholesterolemia   . Hypertension   . Peripheral neuropathy   . Polycythemia vera ([place]) 2006  . Substance abuse ([place])   . Type II diabetes mellitus ([place])    Past Surgical History:  Past Surgical History:  Procedure Laterality Date  . COLONOSCOPY WITH PROPOFOL N/A 04/28/2014   Procedure: COLONOSCOPY WITH PROPOFOL;  Surgeon: Gatha Mayer, MD;  Location: [facility];  Service: Endoscopy;  Laterality: N/A;  . IRRIGATION AND DEBRIDEMENT ABSCESS  09/26/2011   Procedure: IRRIGATION AND DEBRIDEMENT ABSCESS;  Surgeon:  Imogene Burn. Georgette Dover, MD;  Location: [facility];  Service: General;  Laterality: Left;  Incision and Drainage left breast abscess   Social History:  reports that he has been smoking Cigarettes.  He has a 8.00 pack-year smoking history. He has never used smokeless tobacco. He reports that he drinks alcohol. He reports that he uses drugs, including Marijuana and "Crack" cocaine.  Family / Support Systems Marital Status: Single Patient Roles: Partner, Other (Comment) (sibling) Spouse/Significant Other: Engineer, civil (consulting) Other Supports: [name] Lewis-sister [phone]  James-older brother. [name]  Anticipated Caregiver: Sister reports he will need to go to a NH then home. Tony-cousin recently released from prison and can assist, but sister does not want this Ability/Limitations of Caregiver: Elenor Legato has cancer and was not assisting pt PTA, [name] may assist and Izell [employer] Mae-girlfriend works nights. [name] feels this is a bad environment for him and not liveable where he was staying Caregiver Availability: Other (Comment) (Sister wants him to go to a NH or if she can find a place to accommodate him on a first floor.) Family Dynamics: Pt was staying at [place] and Aunt's childhood home in a back apartment but not liveable according to sister. He was hanging around the wrong people and not doing well. He is close with Tony-cousin, brother and sister, along with his friends he grew up with  Social History Preferred language: English Religion: Non-Denominational Cultural Background: No issues Education: High School Read: Yes Write: Yes Employment Status:  Unemployed Date Retired/Disabled/Unemployed: trying to get disability will re-apply Legal Hisotry/Current Legal Issues: No issues Guardian/Conservator: none-according to MD pt is capable of mkaikng his own decisions while here. Made sister aware of this if it comes down to where pt wants to go and where she wants him to go at discharge.    Abuse/Neglect Physical Abuse: Denies Verbal Abuse: Denies Sexual Abuse: Denies Exploitation of patient/patient's resources: Denies Self-Neglect: Denies  Emotional Status Pt's affect, behavior adn adjustment status: Pt uses a comunication board to communicate, he wants to do well and not be like this. After his first stroke in 2017 he recovered and was independent again. He wants to do this again. His sister feels she will not let him go back to where he as staying due to not liveable. No bathing facilities and AUnt does not let him inot the main house to bathe or wash his clothes. Recent Psychosocial Issues: Other health issues was followed by [facility] Pyschiatric History: History of anxiety takes medications at times. Will need to see neuro-psych when appropriate for coping since he is quite tearful and having difficulty coping with this stroke, will get team's input on this and speech. May have see and follow while here and see how much he can do with the communication board. Substance Abuse History: Tobacco, ETOH and Cocaine positive when admitted. Pt is aware of the need to stop this and sister feels it was the friends he hangs out with, which contribute to his using  Patient / Family Perceptions, Expectations & Goals Pt/Family understanding of illness & functional limitations: Sister and pt are aware of his stroke, this is his second stroke and he hopes to recover like he did on the last one. Both have spoken with the MD and feel they have a good understanding of treatment plan. 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[facility]) Premorbid Home Care/DME Agencies: None Transportation available at discharge: SCAT and siblings Resource referrals recommended: Neuropsychology, Support group (specify)  Discharge Planning Living Arrangements: Other relatives Support Systems: Spouse/significant other, Other relatives, Friends/neighbors Type of Residence: Private residence Insurance Resources: Self-pay (Needs to re-apply for Medicaid ) Financial Resources: Family Support (Needs to apply for SSD and SSI) Financial Screen Referred: Yes Living Expenses: Lives with family Money Management: Family Does the patient have any problems obtaining your medications?: No (has an orange card for assistance with medications) Home Management: Self-very minimal, according to sister Patient/Family Preliminary Plans: Unsure of the plan at this time, sister wants him to go to a NH to continue rehab and get better. She also plans to look for a first floor place where he could come and stay with her. Pt wants to go back to where he was living due to where all of his friends are. he grew up there.  Made aware he will require care at discharge and who would propvide this. Will need to see how much progress he makes while here and what the best and safest plan for him will be. He according to MD is capable of making his own decisions made sister aware of this and she will continue to discuss with him what is the best  plan. Sw Barriers to Discharge: Lack of/limited family support, Insurance for SNF coverage, Medication compliance Sw Barriers to Discharge Comments: As of now pt does not have 24 hr care in place.  Has no insurance for NHP and has been non-compliant with medications  Social Work Anticipated Follow Up Needs: HH/OP, SNF, Support Group  Clinical Impression Pleasant gentleman who is here after suffering another stroke. He is quite tearful due to speech deficits and inability to communicate, using a communication board which  he is quite good with. His sister is here and expresses the  Place he was staying was awful and not livable. She wants him to go to a NH or if she can find a place on the first floor come with her. She has told pt this and he just shakes his head. Will need for him to re-apply for disability and Medicaid since he is uninsured. Will work on the best and safest plan for pt while allowing pt to make the decisions for himself. Will make referral to neuro-psych to follow him while here.  Elease Hashimoto 10/28/2016, 12:55 PM

## 2016-10-28 NOTE — Evaluation (Signed)
Physical Therapy Assessment and Plan  Patient Details  Name: Eric Morton MRN: [id number] Date of Birth: 1965/09/16  PT Diagnosis: Coordination disorder, Difficulty walking, Hemiparesis dominant, Impaired cognition, Impaired sensation and Muscle weakness Rehab Potential: Good ELOS: 2.5- 3 weeks   Today's Date: 10/28/2016 PT Individual Time: 1660-6301 PT Individual Time Calculation (min): 60 min    Problem List:  Patient Active Problem List   Diagnosis Date Noted  . 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 Alcohol use   . Cocaine use   . Dysarthria   . Oropharyngeal dysphagia   . Left-sided weakness 10/23/2016  . Cerebral thrombosis with cerebral infarction 10/23/2016  . Aphasia   . Hyperlipidemia 07/27/2015  . Cerebrovascular accident (CVA) due to occlusion of cerebral artery ([place])   . Stroke ([place]) 03/21/2015  . Polycythemia vera ([place]) 03/21/2015  . Hx of adenomatous colonic polyps   . Dehydration   . Diarrhea   . Protein-calorie malnutrition ([place])   . Cellulitis of breast of male 09/25/2011  . HTN (hypertension) 09/25/2011  . DM (diabetes mellitus) ([place]) 09/25/2011  . Leukopenia 09/25/2011  . Alcohol abuse 09/25/2011  . Cocaine abuse ([place]) 09/25/2011  . Tobacco use 09/25/2011  . Neuropathy 09/25/2011    Past Medical History:  Past Medical History:  Diagnosis Date  . 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 Anxiety   . Chronic kidney disease    "jsut a little damage"  . Glaucoma   . History of stomach ulcers    not confirmed, never underwent diagnostic endo or radiology, this manifested as epigastric distress.   [name] Hx of adenomatous colonic polyps   . Hypercholesterolemia   . Hypertension   . Peripheral neuropathy   . Polycythemia vera ([place]) 2006  . Substance abuse ([place])   . Type II diabetes mellitus ([place])    Past Surgical History:  Past Surgical History:  Procedure Laterality Date  . COLONOSCOPY WITH PROPOFOL N/A 04/28/2014    Procedure: COLONOSCOPY WITH PROPOFOL;  Surgeon: Gatha Mayer, MD;  Location: [facility];  Service: Endoscopy;  Laterality: N/A;  . IRRIGATION AND DEBRIDEMENT ABSCESS  09/26/2011   Procedure: IRRIGATION AND DEBRIDEMENT ABSCESS;  Surgeon: Imogene Burn. Georgette Dover, MD;  Location: [facility];  Service: General;  Laterality: Left;  Incision and Drainage left breast abscess    Assessment & Plan Clinical Impression: Patient is a 51 y.o. year old male with history of polysubstance abuse,documented CKD with creatinine 1.02, HTN,polycythemia vera, diabetes mellitus and prior CVA in 2017 and right side residual deficits. Per chart review, patient lives alone in an apartment connected to his aunt's home. Independent prior to admission. He has a cousin with good support. Presented on 10/23/16 with right hemiparesis and dysarthria. Work up revealed UDS + for cocaine. BP elevated into the 200'/100's requiring IV labetalol. CT cerebral perfusion scan did not detect for infarction. CT angiogram head and neck negative for emergent large vessel occlusion. No significant carotid or right vertebral artery stenosis in the neck despite atherosclerosis. 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 MRI of the brain reviewed, showing multiple CVAs. Per report, numerous infarcts including a large 18x62m left basal ganglia infarct suggestive of an embolic etiology. Echocardiogram with ejection fraction of 660%grade 1 diastolic dysfunction. Bilateral lower extremity Dopplers negative. Pt on D1/honey thick liquid dit currently.  Currently maintained on aspirin and Plavix for CVA prophylaxis. Subcutaneous Lovenox for DVT prophylaxis. Hemoglobin A1c of  8.9.Patient was admitted for a comprehensive rehabilitation program. Patient transferred to CIR on 10/27/2016 .   Patient currently requires mod with mobility secondary to muscle weakness, decreased coordination and decreased motor planning and decreased sitting  balance, decreased standing balance, decreased postural control, hemiplegia and decreased balance strategies.  Prior to hospitalization, patient was independent  with mobility and lived with Family (aunt and cousin) in a Apartment (connected to aunts house) home.  Home access is  Level entry.  Patient will benefit from skilled PT intervention to maximize safe functional mobility, minimize fall risk and decrease caregiver burden for planned discharge home with 24 hour supervision.  Anticipate patient will benefit from follow up [place] at discharge.  PT - End of Session Activity Tolerance: Decreased this session Endurance Deficit: Yes Endurance Deficit Description: Required rest breaks throughout seated bathing/dressing tasks PT Assessment Rehab Potential (ACUTE/IP ONLY): Good PT Barriers to Discharge: Decreased caregiver support;Behavior;Home environment access/layout PT Patient demonstrates impairments in the following area(s): Balance;Behavior;Endurance;Motor;Nutrition;Sensory;Perception;Safety PT Transfers Functional Problem(s): Bed Mobility;Bed to Chair;Car;Furniture;Floor PT Locomotion Functional Problem(s): Ambulation;Wheelchair Mobility;Stairs PT Plan PT Intensity: Minimum of 1-2 x/day ,45 to 90 minutes PT Frequency: 5 out of 7 days PT Duration Estimated Length of Stay: 2.5- 3 weeks PT Treatment/Interventions: Ambulation/gait training;Discharge planning;Functional mobility training;Therapeutic Activities;Visual/perceptual remediation/compensation;Balance/vestibular training;Neuromuscular re-education;Therapeutic Exercise;Wheelchair propulsion/positioning;Cognitive remediation/compensation;DME/adaptive equipment instruction;Pain management;Splinting/orthotics;UE/LE Strength taining/ROM;Community reintegration;Functional electrical stimulation;Patient/family education;Stair training;UE/LE Coordination activities PT Transfers Anticipated Outcome(s): supervision PT Locomotion Anticipated  Outcome(s): min assist PT Recommendation Follow Up Recommendations: Home health PT Patient destination: Home Equipment Recommended: To be determined  Skilled Therapeutic Intervention Evaluation completed (see details above and below) with education on PT POC and goals and individual treatment initiated with focus on functional mobility, R LE NMR and activity tolerance. Pt seated in w/c upon PT arrival, asking for something to drink. Pt provided honey think lemon water and consumed with supervision. Pt propelled w/c x 100 ft using L UE and L LE, instruction and verbal cues for L hemi technique and requiring min assist for steering. Pt performed stand pivot transfer from w/c<>car with mod assist. Pt performed sit<>stands x 3 this session with mod assist, verbal cues for technique. Pt performed squat pivot transfer from w/c <>mat with mod assist. Pt performed bed mobility with min assist, transferring sitting<>supine on the therapy mat without handrails. Pt ambulated x 30 ft this session with mod assist, verbal cues for sequencing, tactile cues for R knee extension during stance as patient would occasionally buckle, pt with good advancement of R LE during swing but requiring verbal cues for placement. Pt ascended/descended two 3 inch steps using L handrail and max assist. Pt left seated in w/c at end of session with needs in reach.   PT Evaluation Precautions/Restrictions Precautions Precautions: Fall Restrictions Weight Bearing Restrictions: No General   Vital Signs Pain  pe denies pain Home Living/Prior Functioning Home Living Living Arrangements: Other relatives Available Help at Discharge: Family Type of Home: Apartment (connected to aunts house) Home Access: Level entry Home Layout: One level Additional Comments: Pt's sister present at eval and reports that she does not want him to return to previous living location. She is willing to have pt live with her, however, she lives on second  level apartment  Lives With: Family (aunt and cousin) Prior Function Level of Independence: Independent with basic ADLs;Independent with gait  Able to Take Stairs?: Yes Driving: No Vocation: Unemployed Comments: Info gathered from chart, pt minimally able to communicate. Pt reports he wants to go back to  his aunt's upon discharge Vision/Perception  Vision - Assessment Additional Comments: No visual deficits observed, unable to complete formal testing due to cognitive and laungauge deficits Perception Perception: Within Functional Limits Praxis Praxis: Intact  Cognition Overall Cognitive Status: Difficult to assess Arousal/Alertness: Awake/alert Orientation Level: Oriented X4 Attention: Selective Selective Attention: Appears intact Memory: Appears intact Awareness: Appears intact Behaviors: Poor frustration tolerance;Other (comment);Lability;Impulsive Safety/Judgment: Appears intact Sensation Sensation Light Touch: Impaired by gross assessment Light Touch Impaired Details: Impaired RUE Stereognosis: Not tested Hot/Cold: Not tested Proprioception: Impaired by gross assessment Proprioception Impaired Details: Impaired RUE Coordination Gross Motor Movements are Fluid and Coordinated: No Fine Motor Movements are Fluid and Coordinated: No Coordination and Movement Description: L Hemiplegia UE>LE Finger Nose Finger Test: WFL L UE, unable to assess R UE Motor  Motor Motor: Hemiplegia Motor - Skilled Clinical Observations: R hemiplegia UE>LE  Trunk/Postural Assessment  Cervical Assessment Cervical Assessment: Within Functional Limits Thoracic Assessment Thoracic Assessment: Exceptions to [facility] (kyphotic) Lumbar Assessment Lumbar Assessment: Exceptions to [facility] (posterior pelvic tilit) Postural Control Postural Control: Deficits on evaluation  Balance Balance Balance Assessed: Yes Static Sitting Balance Static Sitting - Balance Support: Feet supported;Left upper extremity  supported Static Sitting - Level of Assistance: 5: Stand by assistance Static Sitting - Comment/# of Minutes: Sitting on toilet Dynamic Sitting Balance Dynamic Sitting - Balance Support: During functional activity;Feet supported;Left upper extremity supported Dynamic Sitting - Level of Assistance: 4: Min assist Static Standing Balance Static Standing - Balance Support: During functional activity;Left upper extremity supported Static Standing - Level of Assistance: 3: Mod assist Static Standing - Comment/# of Minutes: Standing at sink Dynamic Standing Balance Dynamic Standing - Balance Support: During functional activity;Left upper extremity supported Dynamic Standing - Level of Assistance: 3: Mod assist Dynamic Standing - Comments: Standing for LB dressing and bathing tasks to be completed Extremity Assessment  RUE Assessment RUE Assessment: Exceptions to [place] Overall Strength: Deficits (0/5 wrist and hand, trace elbow flexion gravity eliminated) LUE Assessment LUE Assessment: Within Functional Limits RLE Assessment RLE Assessment: Exceptions to WFL (ROM WFL) RLE Strength Right Hip Flexion: 3+/5 Right Hip Extension: 3+/5 Right Knee Flexion: 3-/5 Right Knee Extension: 3+/5 Right Ankle Dorsiflexion: 3+/5 LLE Assessment LLE Assessment: Within Functional Limits   See Function Navigator for Current Functional Status.   Refer to Care Plan for Long Term Goals  Recommendations for other services: None   Discharge Criteria: Patient will be discharged from PT if patient refuses treatment 3 consecutive times without medical reason, if treatment goals not met, if there is a change in medical status, if patient makes no progress towards goals or if patient is discharged from hospital.  The above assessment, treatment plan, treatment alternatives and goals were discussed and mutually agreed upon: by patient  Netta Corrigan, PT, DPT 10/28/2016, 3:35 PM

## 2016-10-29 ENCOUNTER — Inpatient Hospital Stay (HOSPITAL_COMMUNITY): Payer: Medicaid Other | Admitting: Speech Pathology

## 2016-10-29 ENCOUNTER — Encounter (HOSPITAL_COMMUNITY): Payer: No Typology Code available for payment source | Admitting: Psychology

## 2016-10-29 ENCOUNTER — Inpatient Hospital Stay (HOSPITAL_COMMUNITY): Payer: No Typology Code available for payment source | Admitting: Occupational Therapy

## 2016-10-29 ENCOUNTER — Inpatient Hospital Stay (HOSPITAL_COMMUNITY): Payer: No Typology Code available for payment source | Admitting: Physical Therapy

## 2016-10-29 DIAGNOSIS — I69351 Hemiplegia and hemiparesis following cerebral infarction affecting right dominant side: Principal | ICD-10-CM

## 2016-10-29 DIAGNOSIS — I69391 Dysphagia following cerebral infarction: Secondary | ICD-10-CM

## 2016-10-29 DIAGNOSIS — I69322 Dysarthria following cerebral infarction: Secondary | ICD-10-CM

## 2016-10-29 LAB — GLUCOSE, CAPILLARY
GLUCOSE-CAPILLARY: 120 mg/dL — AB (ref 65–99)
GLUCOSE-CAPILLARY: 119 mg/dL — AB (ref 65–99)
Glucose-Capillary: 144 mg/dL — ABNORMAL HIGH (ref 65–99)
Glucose-Capillary: 125 mg/dL — ABNORMAL HIGH (ref 65–99)

## 2016-10-29 NOTE — Progress Notes (Signed)
Occupational Therapy Session Note  Patient Details  Name: Eric Morton MRN: [id number] Date of Birth: 05-Jul-1965  Today's Date: 10/29/2016 OT Individual Time: 0930-1030 OT Individual Time Calculation (min): 60 min    Short Term Goals: Week 1:  OT Short Term Goal 1 (Week 1): Pt will consistently recall hemi dressing technique with no more than 1 VC OT Short Term Goal 2 (Week 1): Pt will stand with min steadying assist while attempting to pull pants up OT Short Term Goal 3 (Week 1): Pt will complete1/3 toileting tasks in order to reduce caregiver burden OT Short Term Goal 4 (Week 1): Pt will thread B LEs into pants with close supervision in order to reduce caregiver burden  Skilled Therapeutic Interventions/Progress Updates:    Pt seen for OT ADL bathing/dressing session. Pt sitting up in recliner upon arrival, agreeable to tx session. He completed stand pivot transfers throughout session with min A and VCs for technique/ sequencing.  He bathed seated on BSC, demonstrated good functional sitting balance during task.  He dressed seated in w/c, very motivated to complete dressing tasks without assist. He was able to independently recall and utilize hemi dressing technique taught in yesterday's session. With increased time he was able to thread B LEs into pants/underwears and stood with max steadying assist while pt pulled up pants. He transferred back to recliner at end of session, left seated with all needs in reach, QRB donned and telesitter activated. Pt's family members entering as therapist exited. Pt communicated via communication board throughout session mod I.  Therapy Documentation Precautions:  Precautions Precautions: Fall Restrictions Weight Bearing Restrictions: No Pain: Pain Assessment Pain Assessment: No/denies pain Pain Score: 0-No pain  See Function Navigator for Current Functional Status.   Therapy/Group: Individual Therapy  Lewis, Shanequa Whitenight C 10/29/2016, 7:15 AM

## 2016-10-29 NOTE — Progress Notes (Signed)
Physical Therapy Session Note  Patient Details  Name: Eric Morton MRN: [id number] Date of Birth: 01/05/1966  Today's Date: 10/29/2016 PT Individual Time: 1330-1403 PT Individual Time Calculation (min): 33 min   Short Term Goals: Week 1:  PT Short Term Goal 1 (Week 1): Pt will transfer with min assist from bed<>w/c PT Short Term Goal 2 (Week 1): Pt will ambulate x 50 ft with mod assist PT Short Term Goal 3 (Week 1): Pt will propel w/c 150 ft with min assist  Skilled Therapeutic Interventions/Progress Updates: Pt presented in recliner agreeable to therapy. Performed squat pivot transfer to w/c minA with cues for hand placement and sequencing. Pt propelled to rehab gym initially minA with verbal cues for technique and improved to supervision with verbal cues for R side object neogotiation. Pt performed squat pivot to mat minA. Performed sit to/from stand x 5 with improvement to minA no AD. Attempted reaching activities with [facility] assist for RUE with occurrence to R knee buckling requiring pt to return to sitting. Pt expressed frustration at loss of strength (via communication board). Performed squat pivot return to w/c in same manner as prior and pt transported back to room for time management. Returned to recliner in same manner as prior and pt left with call bell within reach and needs met.      Therapy Documentation Precautions:  Precautions Precautions: Fall Restrictions Weight Bearing Restrictions: No General:   Vital Signs: Therapy Vitals Temp: 98.1 F (36.7 C) Temp Source: Oral Pulse Rate: 74 Resp: 16 BP: 125/81 Patient Position (if appropriate): Sitting Oxygen Therapy SpO2: 99 % O2 Device: Not Delivered Pain: Pain Assessment Pain Assessment: No/denies pain Faces Pain Scale: No hurt   See Function Navigator for Current Functional Status.   Therapy/Group: Individual Therapy  Orli Degrave  Alzena Gerber, PTA  10/29/2016, 3:33 PM

## 2016-10-29 NOTE — Progress Notes (Signed)
Speech Language Pathology Daily Session Note  Patient Details  Name: Eric Morton MRN: [id number] Date of Birth: Mar 29, 1965  Today's Date: 10/29/2016 SLP Individual Time: 0730-0830 SLP Individual Time Calculation (min): 60 min  Short Term Goals: Week 1: SLP Short Term Goal 1 (Week 1): Pt will consume current diet with Min s/s asiration with Mod A verbal cues for use of swallowing compensatory strategies. SLP Short Term Goal 2 (Week 1): Pt will tolerate trials of nectar thick liquids with use of compensatory strategies with Min s/s aspiration over 3 observed sessions to demonstrate readiness for diet advancement. SLP Short Term Goal 3 (Week 1): Pt will tolerate trials of Dys 2 with use of compensatory strategies with Min s/s aspiration and oral residue over 3 observed sessions to demonstrate readiness for diet advancement. SLP Short Term Goal 4 (Week 1): Pt will demonstrate effective use of communication board to express wants/needs at word level with Min A multimodal cues.  SLP Short Term Goal 5 (Week 1): Pt will demonstrate functional problem solving for basic and familiar tasks with Mod A verbal cues. SLP Short Term Goal 6 (Week 1): Pt will utilize speech intelligibility strategies at word level with Max A verbal cues to acheive 50% intelligibility.   Skilled Therapeutic Interventions:  Skilled treatment session focused on dysphagia and cognitive linguistic goals. SLP facilitated session by providing skilled observation of pt consuming dysphagia 1 with honey thick liquids via spoon. Pt with independent physical use of hand to ensure labial closure with puree boluses. Although pt is able to obtain and maintain labial closure, he presents with a repetitive sucking pattern to consume each puree bolus. Suspect this sucking motion helps with AP transfer of bolus. Pt with no overt s/s of aspiration with consumption of puree or honey thick. Pt required Max A cues for redirection to task. He was able to  effectively use alphabet board to communicate in sentences his wants and needs. With Mod A cues, he was able to achieve ~75% intelligibility at the simple phrase level. Pt handed off to NT to complete observation of pt consuming breakfast. Continue per current plan of care.      Function:  Eating Eating   Modified Consistency Diet: Yes Eating Assist Level: Set up assist for;Supervision or verbal cues;Helper checks for pocketed food;More than reasonable amount of time   Eating Set Up Assist For: Opening containers       Cognition Comprehension Comprehension assist level: Follows basic conversation/direction with extra time/assistive device;Understands basic 90% of the time/cues < 10% of the time  Expression   Expression assist level: Expresses basic 25 - 49% of the time/requires cueing 50 - 75% of the time. Uses single words/gestures.  Social Interaction Social Interaction assist level: Interacts appropriately 50 - 74% of the time - May be physically or verbally inappropriate.  Problem Solving Problem solving assist level: Solves basic 50 - 74% of the time/requires cueing 25 - 49% of the time;Solves basic 75 - 89% of the time/requires cueing 10 - 24% of the time  Memory Memory assist level: Recognizes or recalls 50 - 74% of the time/requires cueing 25 - 49% of the time;Recognizes or recalls 75 - 89% of the time/requires cueing 10 - 24% of the time    Pain Pain Assessment Pain Assessment: No/denies pain Faces Pain Scale: No hurt  Therapy/Group: Individual Therapy  Atthew Coutant 10/29/2016, 12:47 PM

## 2016-10-29 NOTE — Progress Notes (Signed)
Physical Therapy Session Note  Patient Details  Name: Eric Morton MRN: [id number] Date of Birth: 04-14-1965  Today's Date: 10/29/2016 PT Individual Time: 1100-1200 PT Individual Time Calculation (min): 60 min   Short Term Goals: Week 1:  PT Short Term Goal 1 (Week 1): Pt will transfer with min assist from bed<>w/c PT Short Term Goal 2 (Week 1): Pt will ambulate x 50 ft with mod assist PT Short Term Goal 3 (Week 1): Pt will propel w/c 150 ft with min assist  Skilled Therapeutic Interventions/Progress Updates:   Pt received sitting in WC and agreeable to PT  PT transported pt to rehab gym in [facility].   Gait training with HW. Mod-max assist from PT for RLE limb advancement x 29f. PT required to provide max cues for AD management as well as gait pattern, posture, and sequencing.   WC mobility instructed by PT 2x 2026f and 14072fith supervision assist overall. Pt  Utilized L hemi technique, with improved success once PT donned shoes for pt. Moderate cues for turning technique and floor transition.   Nustep reciprocal movement training 5 min+3 min level 3>5 BLE only. PT provided moderate cues for symmetry of movement and improved posture with exertion.   Throughout treatment, pt perform squat pivot transfers with min-mod assist. PT required to provide moderate cues for set up and positioning  to improve safety and success of transfers.   Patient returned to room and left sitting in recliner with call bell in reach and all needs met.        Therapy Documentation Precautions:  Precautions Precautions: Fall Restrictions Weight Bearing Restrictions: No Pain: Pain Assessment Pain Assessment: No/denies pain Faces Pain Scale: No hurt  See Function Navigator for Current Functional Status.   Therapy/Group: Individual Therapy  AusLorie Phenix/03/2016, 1:26 PM

## 2016-10-29 NOTE — Progress Notes (Signed)
Subjective/Complaints:  No issues overnite, trying to use communication board, has cup of honey thick liq that he is drinking without supervision, supposed to use teaspoon  ROS- not able to obtain full review due to communication issues, with aid of SLP pt c/o Low back pain and he points to bed  Objective: Vital Signs: Blood pressure (!) 145/3, pulse 68, temperature 98 F (36.7 C), temperature source Oral, resp. rate 18, height _0  (1.702 m), weight 74.8 kg (165 lb), SpO2 100 %. No results found. Results for orders placed or performed during the hospital encounter of 10/27/16 (from the past 72 hour(s))  CBC     Status: Abnormal   Collection Time: 10/27/16  4:40 PM  Result Value Ref Range   WBC 4.8 4.0 - 10.5 K/uL   RBC 4.94 4.22 - 5.81 MIL/uL   Hemoglobin 13.9 13.0 - 17.0 g/dL   HCT 43.1 39.0 - 52.0 %   MCV 87.2 78.0 - 100.0 fL   MCH 28.1 26.0 - 34.0 pg   MCHC 32.3 30.0 - 36.0 g/dL   RDW 13.3 11.5 - 15.5 %   Platelets 144 (L) 150 - 400 K/uL  Creatinine, serum     Status: None   Collection Time: 10/27/16  4:40 PM  Result Value Ref Range   Creatinine, Ser 1.02 0.61 - 1.24 mg/dL   GFR calc non Af Amer >60 >60 mL/min   GFR calc Af Amer >60 >60 mL/min    Comment: (NOTE) The eGFR has been calculated using the CKD EPI equation. This calculation has not been validated in all clinical situations. eGFR's persistently <60 mL/min signify possible Chronic Kidney Disease.   Glucose, capillary     Status: Abnormal   Collection Time: 10/27/16  5:43 PM  Result Value Ref Range   Glucose-Capillary 108 (H) 65 - 99 mg/dL  Glucose, capillary     Status: Abnormal   Collection Time: 10/27/16  8:45 PM  Result Value Ref Range   Glucose-Capillary 131 (H) 65 - 99 mg/dL   Comment 1 Notify RN   CBC WITH DIFFERENTIAL     Status: Abnormal   Collection Time: 10/28/16  4:31 AM  Result Value Ref Range   WBC 5.4 4.0 - 10.5 K/uL   RBC 4.53 4.22 - 5.81 MIL/uL   Hemoglobin 12.8 (L) 13.0 - 17.0 g/dL   HCT 39.7 39.0 - 52.0 %   MCV 87.6 78.0 - 100.0 fL   MCH 28.3 26.0 - 34.0 pg   MCHC 32.2 30.0 - 36.0 g/dL   RDW 13.3 11.5 - 15.5 %   Platelets 137 (L) 150 - 400 K/uL   Neutrophils Relative % 39 %   Neutro Abs 2.1 1.7 - 7.7 K/uL   Lymphocytes Relative 43 %   Lymphs Abs 2.3 0.7 - 4.0 K/uL   Monocytes Relative 11 %   Monocytes Absolute 0.6 0.1 - 1.0 K/uL   Eosinophils Relative 7 %   Eosinophils Absolute 0.4 0.0 - 0.7 K/uL   Basophils Relative 0 %   Basophils Absolute 0.0 0.0 - 0.1 K/uL  Comprehensive metabolic panel     Status: Abnormal   Collection Time: 10/28/16  4:31 AM  Result Value Ref Range   Sodium 138 135 - 145 mmol/L   Potassium 3.6 3.5 - 5.1 mmol/L   Chloride 105 101 - 111 mmol/L   CO2 24 22 - 32 mmol/L   Glucose, Bld 116 (H) 65 - 99 mg/dL   BUN 11 6 - 20 mg/dL  Creatinine, Ser 1.03 0.61 - 1.24 mg/dL   Calcium 8.7 (L) 8.9 - 10.3 mg/dL   Total Protein 5.9 (L) 6.5 - 8.1 g/dL   Albumin 2.7 (L) 3.5 - 5.0 g/dL   AST 47 (H) 15 - 41 U/L   ALT 26 17 - 63 U/L   Alkaline Phosphatase 68 38 - 126 U/L   Total Bilirubin 0.8 0.3 - 1.2 mg/dL   GFR calc non Af Amer >60 >60 mL/min   GFR calc Af Amer >60 >60 mL/min    Comment: (NOTE) The eGFR has been calculated using the CKD EPI equation. This calculation has not been validated in all clinical situations. eGFR's persistently <60 mL/min signify possible Chronic Kidney Disease.    Anion gap 9 5 - 15  Glucose, capillary     Status: None   Collection Time: 10/28/16  6:28 AM  Result Value Ref Range   Glucose-Capillary 93 65 - 99 mg/dL   Comment 1 Notify RN   Glucose, capillary     Status: Abnormal   Collection Time: 10/28/16 12:54 PM  Result Value Ref Range   Glucose-Capillary 180 (H) 65 - 99 mg/dL  Glucose, capillary     Status: Abnormal   Collection Time: 10/28/16  4:53 PM  Result Value Ref Range   Glucose-Capillary 160 (H) 65 - 99 mg/dL  Glucose, capillary     Status: Abnormal   Collection Time: 10/28/16  9:26 PM  Result  Value Ref Range   Glucose-Capillary 161 (H) 65 - 99 mg/dL   Comment 1 Notify RN   Glucose, capillary     Status: Abnormal   Collection Time: 10/29/16  6:37 AM  Result Value Ref Range   Glucose-Capillary 120 (H) 65 - 99 mg/dL   Comment 1 Notify RN      HEENT: honey liquid spillage R corner of mouth Cardio: RRR and no murmur Resp: CTA B/L and unlabored GI: BS positive and NT, ND Extremity:  No Edema Skin:   Intact Neuro: Abnormal Sensory difficult to assess due to dysarthria, Abnormal Motor 0/5 RUE trace elbow flex, 4- Left knee extensor, Dysarthric and no emotional lability- Musc/Skel:  Other mild tenderness to palpation over lumbar paraspinals Gen NAD   Assessment/Plan: 1. Functional deficits secondary to Right hemiparesis , dysarthria, dysphagia which require 3+ hours per day of interdisciplinary therapy in a comprehensive inpatient rehab setting. Physiatrist is providing close team supervision and 24 hour management of active medical problems listed below. Physiatrist and rehab team continue to assess barriers to discharge/monitor patient progress toward functional and medical goals. FIM: Function - Bathing Position: Wheelchair/chair at sink Body parts bathed by patient: Right arm, Chest, Abdomen, Front perineal area, Left lower leg, Back Body parts bathed by helper: Left arm, Buttocks, Right upper leg, Left upper leg, Right lower leg  Function- Upper Body Dressing/Undressing What is the patient wearing?: Pull over shirt/dress Pull over shirt/dress - Perfomed by patient: Thread/unthread right sleeve, Pull shirt over trunk Pull over shirt/dress - Perfomed by helper: Thread/unthread left sleeve, Put head through opening Function - Lower Body Dressing/Undressing What is the patient wearing?: Underwear, Pants, Non-skid slipper socks Position: Wheelchair/chair at sink Underwear - Performed by patient: Thread/unthread left underwear leg Underwear - Performed by helper:  Thread/unthread right underwear leg, Pull underwear up/down Pants- Performed by patient: Thread/unthread left pants leg Pants- Performed by helper: Thread/unthread right pants leg, Pull pants up/down Non-skid slipper socks- Performed by patient: Don/doff right sock Non-skid slipper socks- Performed by helper: Don/doff left  sock Assist for footwear: Maximal assist  Function - Toileting Toileting steps completed by patient: Adjust clothing prior to toileting, Adjust clothing after toileting Toileting steps completed by helper: Adjust clothing prior to toileting, Performs perineal hygiene, Adjust clothing after toileting Toileting Assistive Devices: Grab bar or rail Assist level: Touching or steadying assistance (Pt.75%)  Function - Toilet Transfers Toilet transfer assistive device: Grab bar Assist level to toilet: Maximal assist (Pt 25 - 49%/lift and lower) Assist level from toilet: Touching or steadying assistance (Pt > 75%)  Function - Chair/bed transfer Chair/bed transfer method: Squat pivot, Stand pivot Chair/bed transfer assist level: Moderate assist (Pt 50 - 74%/lift or lower) Chair/bed transfer assistive device: Armrests Chair/bed transfer details: Tactile cues for sequencing, Verbal cues for sequencing, Verbal cues for technique, Verbal cues for precautions/safety, Tactile cues for weight shifting  Function - Locomotion: Wheelchair Will patient use wheelchair at discharge?: Yes Type: Manual Max wheelchair distance: 100 ft Assist Level: Touching or steadying assistance (Pt > 75%) Assist Level: Touching or steadying assistance (Pt > 75%) Wheel 150 feet activity did not occur: Safety/medical concerns Turns around,maneuvers to table,bed, and toilet,negotiates 3% grade,maneuvers on rugs and over doorsills: No Function - Locomotion: Ambulation Assistive device: Rail in hallway Max distance: 30 ft Assist level: Moderate assist (Pt 50 - 74%) Assist level: Moderate assist (Pt 50 -  74%) Walk 50 feet with 2 turns activity did not occur: Safety/medical concerns Walk 150 feet activity did not occur: Safety/medical concerns Walk 10 feet on uneven surfaces activity did not occur: Safety/medical concerns  Function - Comprehension Comprehension: Auditory Comprehension assist level: Follows basic conversation/direction with extra time/assistive device  Function - Expression Expression: Nonverbal Expression assistive device: Communication board Expression assist level: Expresses basic 25 - 49% of the time/requires cueing 50 - 75% of the time. Uses single words/gestures.  Function - Social Interaction Social Interaction assist level: Interacts appropriately 50 - 74% of the time - May be physically or verbally inappropriate.  Function - Problem Solving Problem solving assist level: Solves basic 75 - 89% of the time/requires cueing 10 - 24% of the time  Function - Memory Memory assist level: Recognizes or recalls 75 - 89% of the time/requires cueing 10 - 24% of the time Patient normally able to recall (first 3 days only): Staff names and faces, That he or she is in a hospital   Medical Problem List and Plan: 1.  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 Formulized individual treatment plan based on medical history, underlying problem and comorbidities. 2.  DVT Prophylaxis/Anticoagulation: Subcutaneous Lovenox. Venous Doppler studies negative, PLT 137K monitor 3. Pain Management: Neurontin 300 mg daily at bedtime 4. Mood: Ego support 5. Neuropsych: This patient is capable of making decisions on his own behalf. 6. Skin/Wound Care:  Routine skin checks 7.  Fluids/Electrolytes/Nutrition:  Routine I&O with follow-up chemistries 8. Dysphagia. Dysphagia #1 honey liquids. Monitor hydration. Follow-up speech therapy 9.Hypertension. Lisinopril 5 mg daily- Monitor 10/3 Vitals:   10/28/16 2200 10/29/16 0527  BP: (!) 145/3   Pulse: 70 68  Resp:  18  Temp:  98 F (36.7 C)  SpO2: 100% 100%   10. Diabetes mellitus peripheral neuropathy. Hemoglobin A1c 8.9. SSI.Check blood sugars before meals and at bedtime. Patient on Glucotrol 5 mg twice a  day, Glucophage 1000 mg twice a day. Resume as needed 11.Polysubstance abuse. UDS positive for cocaine. Provide counseling with Neuropsych 12.History polycythemia. Follow-up labs 13. ?PBA: Monitor.  Will consider Neuropsych, improved this am , on sertraline 14.  Buccal surface laceration likely related to dyscoordinated oral movements, left side over salivary duct will schedule MMW LOS (Days) 2 A FACE TO FACE EVALUATION WAS PERFORMED  Rino Hosea E 10/29/2016, 8:49 AM

## 2016-10-29 NOTE — Patient Care Conference (Signed)
Inpatient RehabilitationTeam Conference and Plan of Care Update Date: 10/29/2016   Time: 10:55 AM    Patient Name: Eric Morton      Medical Record Number: [id number]  Date of Birth: 27-May-1965 Sex: Male         Room/Bed: 4W24C/4W24C-01 Payor Info: Payor: MEDICAID POTENTIAL / Plan: MEDICAID POTENTIAL / Product Type: *No Product type* /    Admitting Diagnosis: L CVA  Admit Date/Time:  10/27/2016  3:38 PM Admission Comments: No comment available   Primary Diagnosis:  Hemiparesis affecting right side as late effect of stroke (HCC) Principal Problem: Hemiparesis affecting right side as late effect of stroke [facility])  Patient Active Problem List   Diagnosis Date Noted  . Hemiparesis affecting right side as late effect of stroke ([place]) 10/29/2016  . Embolic cerebral infarction ([place]) 10/27/2016  . Alcohol use   . Cocaine use   . Dysarthria due to recent stroke   . Dysphagia due to recent stroke   . Left-sided weakness 10/23/2016  . Cerebral thrombosis with cerebral infarction 10/23/2016  . Aphasia   . Hyperlipidemia 07/27/2015  . Cerebrovascular accident (CVA) due to occlusion of cerebral artery ([place])   . Stroke ([place]) 03/21/2015  . Polycythemia vera ([place]) 03/21/2015  . Hx of adenomatous colonic polyps   . Dehydration   . Diarrhea   . Protein-calorie malnutrition ([place])   . Cellulitis of breast of male 09/25/2011  . HTN (hypertension) 09/25/2011  . DM (diabetes mellitus) ([place]) 09/25/2011  . Leukopenia 09/25/2011  . Alcohol abuse 09/25/2011  . Cocaine abuse ([place]) 09/25/2011  . Tobacco use 09/25/2011  . Neuropathy 09/25/2011    Expected Discharge Date: Expected Discharge Date: 11/14/16  Team Members Present: Physician leading conference: Dr. Alysia Penna Social Worker Present: Ovidio Kin, LCSW Nurse Present: Rayetta Humphrey, RN PT Present: Barrie Folk, PT;Rosita Dechalus, PTA OT Present: Napoleon Form, OT SLP Present: Stormy Fabian, SLP PPS Coordinator present : Daiva Nakayama, RN,  CRRN     Current Status/Progress Goal Weekly Team Focus  Medical   Severe dysarthria, dysphagia, Right hemiparesis  reduce aspiration risk, maintain adequate po intake  swallow retraining   Bowel/Bladder   Continent of Bowel/Bladder  Remain continent of Bowel/Bladder while her in Rehab with min,/assist  Assess toileting needs QS and prn    Swallow/Nutrition/ Hydration   dysphagia 1 with honey thick liquids via spoon - Max A  Min A with least restrictive diet  use of compensatory swallow strategies, toleration of current diet   ADL's   Max A overall  Supervision- min A  Standing balance, safety awareness, ADL re-training, functional transfers, neuro re-ed   Mobility   min assist bed mobility, mod assist transfers, mod assist gait at the rail x 30 ft  supervision- min assist  transfer, gait, balance, coordination, R NMR   Communication   Max A for verbal, Min A for use of alphabet board  Min A for multimodal and Mod A for verbal  speech intelligibility strategies   Safety/Cognition/ Behavioral Observations  Mod A for basic  Supervision  safety awareness, sustained attention to task   Pain   Denies pain bu oxxassional discomfor with headache is noted administer Tylenol po with note effectivness    < 2  Monitor for complaint pain QS and admindter prn   Skin   no skin issues   No reports of skin issues while in Rehab  Assess QS,maintain skin intergrity       *See Care Plan and progress notes  for long and short-term goals.     Barriers to Discharge  Current Status/Progress Possible Resolutions Date Resolved   Physician    Medical stability;Medication compliance;Other (comments)  severe dysphagia  progressing toward goals  cont rehab      Nursing  Decreased caregiver support;Lack of/limited family support;Medication compliance               PT  Decreased caregiver support;Behavior;Home environment access/layout                 OT Inaccessible home environment;Decreased caregiver  support;Home environment access/layout;Insurance for SNF coverage  Pt's sister reports that he is not able to return to aunt's apartment where he was living before. She reports that he can live with her, however, she lives on [address] level apartment. Therpaist made her aware of his need for at least 24 hr supervision possibly min A at d/c.              SLP Decreased caregiver support;Medical stability              SW Lack of/limited family support;Insurance for SNF coverage;Medication compliance As of now pt does not have 24 hr care in place.  Has no insurance for NHP and has been non-compliant with medications             Discharge Planning/Teaching Needs:  Unsure of discharge plan-sister wants him to come home with her but lives on [address] level and pt not sure what he wants to do. Sister reports place he is staying is not liveable.      Team Discussion:  Goals supervision-min assist level. Dsyarthia/Dysphagia severe. Dyes 1 honey thick liquid diet. Cont B & B. Working Theatre stage manager. Impulsive when moving. Using communication board with accuracy. Need to come up with a discharge plan  Revisions to Treatment Plan:  DC 10/19 if has a caregiver    Continued Need for Acute Rehabilitation Level of Care: The patient requires daily medical management by a physician with specialized training in physical medicine and rehabilitation for the following conditions: Daily direction of a multidisciplinary physical rehabilitation program to ensure safe treatment while eliciting the highest outcome that is of practical value to the patient.: Yes Daily medical management of patient stability for increased activity during participation in an intensive rehabilitation regime.: Yes Daily analysis of laboratory values and/or radiology reports with any subsequent need for medication adjustment of medical intervention for : Neurological problems;Other;Mood/behavior problems  Elease Hashimoto 10/31/2016, 9:50  AM

## 2016-10-30 ENCOUNTER — Inpatient Hospital Stay (HOSPITAL_COMMUNITY): Payer: Medicaid Other | Admitting: Speech Pathology

## 2016-10-30 ENCOUNTER — Inpatient Hospital Stay (HOSPITAL_COMMUNITY): Payer: No Typology Code available for payment source | Admitting: Physical Therapy

## 2016-10-30 ENCOUNTER — Inpatient Hospital Stay (HOSPITAL_COMMUNITY): Payer: No Typology Code available for payment source | Admitting: Occupational Therapy

## 2016-10-30 LAB — GLUCOSE, CAPILLARY
GLUCOSE-CAPILLARY: 98 mg/dL (ref 65–99)
GLUCOSE-CAPILLARY: 123 mg/dL — AB (ref 65–99)
GLUCOSE-CAPILLARY: 125 mg/dL — AB (ref 65–99)
Glucose-Capillary: 139 mg/dL — ABNORMAL HIGH (ref 65–99)

## 2016-10-30 NOTE — Progress Notes (Signed)
Speech Language Pathology Daily Session Note  Patient Details  Name: Eric Morton MRN: [id number] Date of Birth: May 24, 1965  Today's Date: 10/30/2016 SLP Individual Time: 0700-0800 SLP Individual Time Calculation (min): 60 min  Short Term Goals: Week 1: SLP Short Term Goal 1 (Week 1): Pt will consume current diet with Min s/s asiration with Mod A verbal cues for use of swallowing compensatory strategies. SLP Short Term Goal 2 (Week 1): Pt will tolerate trials of nectar thick liquids with use of compensatory strategies with Min s/s aspiration over 3 observed sessions to demonstrate readiness for diet advancement. SLP Short Term Goal 3 (Week 1): Pt will tolerate trials of Dys 2 with use of compensatory strategies with Min s/s aspiration and oral residue over 3 observed sessions to demonstrate readiness for diet advancement. SLP Short Term Goal 4 (Week 1): Pt will demonstrate effective use of communication board to express wants/needs at word level with Min A multimodal cues.  SLP Short Term Goal 5 (Week 1): Pt will demonstrate functional problem solving for basic and familiar tasks with Mod A verbal cues. SLP Short Term Goal 6 (Week 1): Pt will utilize speech intelligibility strategies at word level with Max A verbal cues to acheive 50% intelligibility.   Skilled Therapeutic Interventions: Skilled treatment session focused on dysphagia and communication goals. SLP facilitated session by providing supervision cues to dress and transfer safely from bed to recliner. Pt with accurate yes/no answers and social interaction with ST and telesitter. Pt able to use alphabet board to ask sentence length questions about current swallow and speech deficits. Pt's questions were appropriate about prognosis for improvement and structurally were grammatically correct. Pt effectively uses combo of speech and gestures with alphabet board to express wants and needs with supervision cues to slow rate when using alphabet  board. Pt is able to obtain ~50% speech intelligibility at the word level with Max A cues. SLP further facilitated session by providing skilled observation of pt consuming ice chips. Pt with cough x 3 when attempting to communicate simultaneously. Pt handed off to NT for breakfast. Continue per current plan of care.      Function:  Eating Eating   Modified Consistency Diet: Yes Eating Assist Level: Set up assist for;Supervision or verbal cues;Helper checks for pocketed food;More than reasonable amount of time   Eating Set Up Assist For: Opening containers       Cognition Comprehension Comprehension assist level: Follows basic conversation/direction with extra time/assistive device;Understands basic 90% of the time/cues < 10% of the time  Expression Expression assistive device: Communication board Expression assist level: Expresses basic needs/ideas: With no assist (With use of borad pt is >95% with basic wants and needs and with verbal < 25%)  Social Interaction Social Interaction assist level: Interacts appropriately 75 - 89% of the time - Needs redirection for appropriate language or to initiate interaction.;Interacts appropriately 90% of the time - Needs monitoring or encouragement for participation or interaction.  Problem Solving Problem solving assist level: Solves basic 75 - 89% of the time/requires cueing 10 - 24% of the time  Memory Memory assist level: Recognizes or recalls 75 - 89% of the time/requires cueing 10 - 24% of the time    Pain Pain Assessment Pain Assessment: No/denies pain Pain Score: 0-No pain  Therapy/Group: Individual Therapy  Shatona Andujar 10/30/2016, 8:49 AM

## 2016-10-30 NOTE — Progress Notes (Signed)
Speech Language Pathology Daily Session Note  Patient Details  Name: Eric Morton MRN: [id number] Date of Birth: 01/01/1966  Today's Date: 10/30/2016 SLP Individual Time: 1130-1205 SLP Individual Time Calculation (min): 35 min  Short Term Goals: Week 1: SLP Short Term Goal 1 (Week 1): Pt will consume current diet with Min s/s asiration with Mod A verbal cues for use of swallowing compensatory strategies. SLP Short Term Goal 2 (Week 1): Pt will tolerate trials of nectar thick liquids with use of compensatory strategies with Min s/s aspiration over 3 observed sessions to demonstrate readiness for diet advancement. SLP Short Term Goal 3 (Week 1): Pt will tolerate trials of Dys 2 with use of compensatory strategies with Min s/s aspiration and oral residue over 3 observed sessions to demonstrate readiness for diet advancement. SLP Short Term Goal 4 (Week 1): Pt will demonstrate effective use of communication board to express wants/needs at word level with Min A multimodal cues.  SLP Short Term Goal 5 (Week 1): Pt will demonstrate functional problem solving for basic and familiar tasks with Mod A verbal cues. SLP Short Term Goal 6 (Week 1): Pt will utilize speech intelligibility strategies at word level with Max A verbal cues to acheive 50% intelligibility.   Skilled Therapeutic Interventions: Skilled treatment session focused on dysphagia and communication goals. SLP facilitated session by providing Min A verbal cues for use of small bites and attention to bolus during a snack of Dys. 1 textures. Patient consumed snack without overt s/s of aspiration. However, patient with intermittent coughing throughout session due to decreased management of secretions. SLP facilitated session by asking basic biographical information that patient was to verbalize at the word level. Patient was ~40% intelligible and then would use communication board to spell answers to maximize comprehension with Mod A verbal cues  needed for a slow rate and to self-monitor and correct spelling errors.  SLP also provided patient with a mirror to practice attempting to keep his oral cavity closed at rest. Patient left upright in recliner with RN present. Continue with current plan of care.      Function:  Eating Eating   Modified Consistency Diet: Yes Eating Assist Level: Set up assist for;Supervision or verbal cues;Helper checks for pocketed food;More than reasonable amount of time   Eating Set Up Assist For: Opening containers       Cognition Comprehension Comprehension assist level: Follows basic conversation/direction with extra time/assistive device;Understands basic 90% of the time/cues < 10% of the time  Expression Expression assistive device: Communication board Expression assist level: Expresses basic 75 - 89% of the time/requires cueing 10 - 24% of the time. Needs helper to occlude trach/needs to repeat words.  Social Interaction Social Interaction assist level: Interacts appropriately 75 - 89% of the time - Needs redirection for appropriate language or to initiate interaction.;Interacts appropriately 90% of the time - Needs monitoring or encouragement for participation or interaction.  Problem Solving Problem solving assist level: Solves basic 75 - 89% of the time/requires cueing 10 - 24% of the time  Memory Memory assist level: Recognizes or recalls 75 - 89% of the time/requires cueing 10 - 24% of the time    Pain No/Denies Pain   Therapy/Group: Individual Therapy  Chinedum Vanhouten 10/30/2016, 1:50 PM

## 2016-10-30 NOTE — Progress Notes (Addendum)
Social Work Patient ID: CHAI ROUTH, male   DOB: 10/21/1965, 51 y.o.   MRN: [id number]  Met with pt and spoke with [name] and Antionette-sister via telephone to discuss team conference goals supervision To min assist level and target discharge date 10/19. Sister has welcomed him to come and stay with her but will not have access to drugs. Aunt does not have anyone to stay with him at night and wants him to  Have disability and medicaid to pay for someone to help him. She voiced his cousin-Tony can not help him and he is aware of reason why. Sister does not want pt going back to that apartment he was staying due to Not being livable, feels it would be condemned if inspected by health dept. Pt shakes his no to going to sister's apartment or going to a NH. Sister told him if he goes back to that apartment he will not get the care he needs and Will not survive there. Discussed will work on a safe plan for him, but both aware pt is the one making the decision even if it is not the one either of them want or agree with.

## 2016-10-30 NOTE — IPOC Note (Signed)
Overall Plan of Care [facility]) Patient Details Name: Eric Morton MRN: [id number] DOB: Jul 05, 1965  Admitting Diagnosis: Hemiparesis affecting right side as late effect of stroke [facility])  Hospital Problems: Principal Problem:   Hemiparesis affecting right side as late effect of stroke ([place]) Active Problems:   Dysarthria due to recent stroke   Dysphagia due to recent stroke   Embolic cerebral infarction [facility])     Functional Problem List: Nursing Endurance, Medication Management, Pain, Safety, Nutrition, Perception  PT Balance, Behavior, Endurance, Motor, Nutrition, Sensory, Perception, Safety  OT Balance, Cognition, Endurance, Safety, Pain, Motor, Sensory  SLP Cognition  TR         Basic ADL's: OT Eating, Grooming, Bathing, Dressing, Toileting     Advanced  ADL's: OT       Transfers: PT Bed Mobility, Bed to Chair, Car, [employer], Floor  OT Toilet, Metallurgist: PT Ambulation, Emergency planning/management officer, Stairs     Additional Impairments: OT Fuctional Use of Upper Extremity  SLP Swallowing, Communication      TR      Anticipated Outcomes Item Anticipated Outcome  Self Feeding Set-up/supervision  Swallowing  Min A   Basic self-care  Supervision-min A  Toileting  Supervision   Bathroom Transfers Supervision-min A  Bowel/Bladder  Min assist  Transfers  supervision  Locomotion  min assist  Communication  Min A  Cognition  Supervision  Pain  <3 on a 0-10 pain scale  Safety/Judgment  min assist with ambulation   Therapy Plan: PT Intensity: Minimum of 1-2 x/day ,45 to 90 minutes PT Frequency: 5 out of 7 days PT Duration Estimated Length of Stay: 2.5- 3 weeks OT Intensity: Minimum of 1-2 x/day, 45 to 90 minutes OT Frequency: 5 out of 7 days OT Duration/Estimated Length of Stay: 2.5-3 weeks SLP Intensity: Minumum of 1-2 x/day, 30 to 90 minutes SLP Frequency: 3 to 5 out of 7 days SLP Duration/Estimated Length of Stay: 2.5-3 weeks    Team  Interventions: Nursing Interventions Patient/Family Education, Disease Management/Prevention, Pain Management, Medication Management, Dysphagia/Aspiration Precaution Training, Discharge Planning, Psychosocial Support  PT interventions Ambulation/gait training, Discharge planning, Functional mobility training, Therapeutic Activities, Visual/perceptual remediation/compensation, Training and development officer, Neuromuscular re-education, Therapeutic Exercise, Wheelchair propulsion/positioning, Cognitive remediation/compensation, DME/adaptive equipment instruction, Pain management, Splinting/orthotics, UE/LE Strength taining/ROM, Community reintegration, Technical sales engineer stimulation, Patient/family education, IT trainer, UE/LE Coordination activities  OT Interventions Training and development officer, Cognitive remediation/compensation, Discharge planning, Community reintegration, Disease mangement/prevention, Engineer, drilling, Functional mobility training, Neuromuscular re-education, Pain management, Psychosocial support, Patient/family education, Self Care/advanced ADL retraining, Therapeutic Activities, Therapeutic Exercise, Splinting/orthotics, UE/LE Strength taining/ROM, UE/LE Coordination activities, Wheelchair propulsion/positioning  SLP Interventions Cognitive remediation/compensation, Cueing hierarchy, Functional tasks, Dysphagia/aspiration precaution training, Patient/family education, Speech/Language facilitation  TR Interventions    SW/CM Interventions Discharge Planning, Psychosocial Support, Patient/Family Education   Barriers to Discharge MD  Medical stability and Home enviroment access/loayout  Nursing Decreased caregiver support, Lack of/limited family support, Medication compliance    PT Decreased caregiver support, Behavior, Home environment access/layout    OT Inaccessible home environment, Decreased caregiver support, Home environment access/layout, Insurance for SNF  coverage Pt's sister reports that he is not able to return to aunt's apartment where he was living before. She reports that he can live with her, however, she lives on scond level apartment. Therpaist made her aware of his need for at least 24 hr supervision possibly min A at d/c.   SLP Decreased caregiver support, Medical stability    SW Lack of/limited family support, Insurance  for SNF coverage, Medication compliance As of now pt does not have 24 hr care in place.  Has no insurance for NHP and has been non-compliant with medications    Team Discharge Planning: Destination: PT-Home ,OT- Home , SLP-Home Projected Follow-up: PT-Home health PT, OT-  Home health OT, SLP-24 hour supervision/assistance, Skilled Nursing facility, Outpatient SLP, Home Health SLP Projected Equipment Needs: PT-To be determined, OT- To be determined, SLP-None recommended by SLP Equipment Details: PT- , OT-  Patient/family involved in discharge planning: PT- Patient,  OT-Patient, Family member/caregiver, SLP-Patient  MD ELOS: 20-25d Medical Rehab Prognosis:  Good Assessment:  51 y.o.right handedmalewith history of polysubstance abuse,documented CKD with creatinine 1.02, HTN,polycythemia vera, diabetes mellitus and prior CVA in 2017 and right side residual deficits. Per chart review, patient lives alone in an apartment connected to his aunt's home. Independent prior to admission. He has a cousin with good support. Presented on 10/23/16 with right hemiparesis and dysarthria. Work up revealed UDS + for cocaine. BP elevated into the 200'/100's requiring IV labetalol. CT cerebral perfusion scan did not detect for infarction. CT angiogram head and neck negative for emergent large vessel occlusion. No significant carotid or right vertebral artery stenosis in the neck despite atherosclerosis. 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 MRI of the brain reviewed, showing multiple CVAs. Per report,  numerous infarcts including a large 18x4mm left basal ganglia infarct suggestive of an embolic etiology. Echocardiogram with ejection fraction of 15% grade 1 diastolic dysfunction. Bilateral lower extremity Dopplers negative. Pt on D1/honey thick liquid dit currently  Now requiring 24/7 Rehab RN,MD, as well as CIR level PT, OT and SLP.  Treatment team will focus on ADLs and mobility with goals set at supervision to min assist See Team Conference Notes for weekly updates to the plan of care

## 2016-10-30 NOTE — Progress Notes (Signed)
Subjective/Complaints:  Working with PT wheeling self in [place]- not able to obtain full review due to communication issues, with aid of SLP pt c/o Low back pain and he points to bed  Objective: Vital Signs: Blood pressure (!) 158/77, pulse 68, temperature 98 F (36.7 C), temperature source Oral, resp. rate 16, height '5\' 7"'  (1.702 m), weight 74.8 kg (165 lb), SpO2 100 %. No results found. Results for orders placed or performed during the hospital encounter of 10/27/16 (from the past 72 hour(s))  CBC     Status: Abnormal   Collection Time: 10/27/16  4:40 PM  Result Value Ref Range   WBC 4.8 4.0 - 10.5 K/uL   RBC 4.94 4.22 - 5.81 MIL/uL   Hemoglobin 13.9 13.0 - 17.0 g/dL   HCT 43.1 39.0 - 52.0 %   MCV 87.2 78.0 - 100.0 fL   MCH 28.1 26.0 - 34.0 pg   MCHC 32.3 30.0 - 36.0 g/dL   RDW 13.3 11.5 - 15.5 %   Platelets 144 (L) 150 - 400 K/uL  Creatinine, serum     Status: None   Collection Time: 10/27/16  4:40 PM  Result Value Ref Range   Creatinine, Ser 1.02 0.61 - 1.24 mg/dL   GFR calc non Af Amer >60 >60 mL/min   GFR calc Af Amer >60 >60 mL/min    Comment: (NOTE) The eGFR has been calculated using the CKD EPI equation. This calculation has not been validated in all clinical situations. eGFR's persistently <60 mL/min signify possible Chronic Kidney Disease.   Glucose, capillary     Status: Abnormal   Collection Time: 10/27/16  5:43 PM  Result Value Ref Range   Glucose-Capillary 108 (H) 65 - 99 mg/dL  Glucose, capillary     Status: Abnormal   Collection Time: 10/27/16  8:45 PM  Result Value Ref Range   Glucose-Capillary 131 (H) 65 - 99 mg/dL   Comment 1 Notify RN   CBC WITH DIFFERENTIAL     Status: Abnormal   Collection Time: 10/28/16  4:31 AM  Result Value Ref Range   WBC 5.4 4.0 - 10.5 K/uL   RBC 4.53 4.22 - 5.81 MIL/uL   Hemoglobin 12.8 (L) 13.0 - 17.0 g/dL   HCT 39.7 39.0 - 52.0 %   MCV 87.6 78.0 - 100.0 fL   MCH 28.3 26.0 - 34.0 pg   MCHC 32.2 30.0 - 36.0 g/dL   RDW 13.3 11.5 - 15.5 %   Platelets 137 (L) 150 - 400 K/uL   Neutrophils Relative % 39 %   Neutro Abs 2.1 1.7 - 7.7 K/uL   Lymphocytes Relative 43 %   Lymphs Abs 2.3 0.7 - 4.0 K/uL   Monocytes Relative 11 %   Monocytes Absolute 0.6 0.1 - 1.0 K/uL   Eosinophils Relative 7 %   Eosinophils Absolute 0.4 0.0 - 0.7 K/uL   Basophils Relative 0 %   Basophils Absolute 0.0 0.0 - 0.1 K/uL  Comprehensive metabolic panel     Status: Abnormal   Collection Time: 10/28/16  4:31 AM  Result Value Ref Range   Sodium 138 135 - 145 mmol/L   Potassium 3.6 3.5 - 5.1 mmol/L   Chloride 105 101 - 111 mmol/L   CO2 24 22 - 32 mmol/L   Glucose, Bld 116 (H) 65 - 99 mg/dL   BUN 11 6 - 20 mg/dL   Creatinine, Ser 1.03 0.61 - 1.24 mg/dL   Calcium 8.7 (L) 8.9 - 10.3 mg/dL  Total Protein 5.9 (L) 6.5 - 8.1 g/dL   Albumin 2.7 (L) 3.5 - 5.0 g/dL   AST 47 (H) 15 - 41 U/L   ALT 26 17 - 63 U/L   Alkaline Phosphatase 68 38 - 126 U/L   Total Bilirubin 0.8 0.3 - 1.2 mg/dL   GFR calc non Af Amer >60 >60 mL/min   GFR calc Af Amer >60 >60 mL/min    Comment: (NOTE) The eGFR has been calculated using the CKD EPI equation. This calculation has not been validated in all clinical situations. eGFR's persistently <60 mL/min signify possible Chronic Kidney Disease.    Anion gap 9 5 - 15  Glucose, capillary     Status: None   Collection Time: 10/28/16  6:28 AM  Result Value Ref Range   Glucose-Capillary 93 65 - 99 mg/dL   Comment 1 Notify RN   Glucose, capillary     Status: Abnormal   Collection Time: 10/28/16 12:54 PM  Result Value Ref Range   Glucose-Capillary 180 (H) 65 - 99 mg/dL  Glucose, capillary     Status: Abnormal   Collection Time: 10/28/16  4:53 PM  Result Value Ref Range   Glucose-Capillary 160 (H) 65 - 99 mg/dL  Glucose, capillary     Status: Abnormal   Collection Time: 10/28/16  9:26 PM  Result Value Ref Range   Glucose-Capillary 161 (H) 65 - 99 mg/dL   Comment 1 Notify RN   Glucose, capillary      Status: Abnormal   Collection Time: 10/29/16  6:37 AM  Result Value Ref Range   Glucose-Capillary 120 (H) 65 - 99 mg/dL   Comment 1 Notify RN   Glucose, capillary     Status: Abnormal   Collection Time: 10/29/16 12:00 PM  Result Value Ref Range   Glucose-Capillary 144 (H) 65 - 99 mg/dL  Glucose, capillary     Status: Abnormal   Collection Time: 10/29/16  5:21 PM  Result Value Ref Range   Glucose-Capillary 125 (H) 65 - 99 mg/dL  Glucose, capillary     Status: Abnormal   Collection Time: 10/29/16  8:50 PM  Result Value Ref Range   Glucose-Capillary 119 (H) 65 - 99 mg/dL  Glucose, capillary     Status: None   Collection Time: 10/30/16  6:29 AM  Result Value Ref Range   Glucose-Capillary 98 65 - 99 mg/dL     HEENT: honey liquid spillage R corner of mouth Cardio: RRR and no murmur Resp: CTA B/L and unlabored GI: BS positive and NT, ND Extremity:  No Edema Skin:   Intact Neuro: Abnormal Sensory difficult to assess due to dysarthria, Abnormal Motor 0/5 RUE trace elbow flex, 4- Left knee extensor, Dysarthric and no emotional lability- Musc/Skel:  Other mild tenderness to palpation over lumbar paraspinals Gen NAD   Assessment/Plan: 1. Functional deficits secondary to Right hemiparesis , dysarthria, dysphagia which require 3+ hours per day of interdisciplinary therapy in a comprehensive inpatient rehab setting. Physiatrist is providing close team supervision and 24 hour management of active medical problems listed below. Physiatrist and rehab team continue to assess barriers to discharge/monitor patient progress toward functional and medical goals. FIM: Function - Bathing Position: Shower Body parts bathed by patient: Right arm, Chest, Abdomen, Front perineal area, Left lower leg, Back, Right upper leg, Left upper leg Body parts bathed by helper: Left arm, Buttocks, Right lower leg  Function- Upper Body Dressing/Undressing What is the patient wearing?: Pull over shirt/dress Pull  over  shirt/dress - Perfomed by patient: Thread/unthread right sleeve, Pull shirt over trunk Pull over shirt/dress - Perfomed by helper: Thread/unthread left sleeve, Put head through opening Function - Lower Body Dressing/Undressing What is the patient wearing?: Underwear, Pants, Non-skid slipper socks Position: Wheelchair/chair at sink Underwear - Performed by patient: Thread/unthread right underwear leg, Thread/unthread left underwear leg, Pull underwear up/down Underwear - Performed by helper: Thread/unthread right underwear leg, Pull underwear up/down Pants- Performed by patient: Thread/unthread left pants leg, Thread/unthread right pants leg, Pull pants up/down Pants- Performed by helper: Thread/unthread right pants leg, Pull pants up/down Non-skid slipper socks- Performed by patient: Don/doff right sock Non-skid slipper socks- Performed by helper: Don/doff left sock, Don/doff right sock Assist for footwear: Maximal assist Assist for lower body dressing: Touching or steadying assistance (Pt > 75%)  Function - Toileting Toileting steps completed by patient: Adjust clothing prior to toileting, Adjust clothing after toileting Toileting steps completed by helper: Adjust clothing prior to toileting, Performs perineal hygiene, Adjust clothing after toileting Toileting Assistive Devices: Grab bar or rail Assist level: Touching or steadying assistance (Pt.75%)  Function - Air cabin crew transfer assistive device: Grab bar Assist level to toilet: Maximal assist (Pt 25 - 49%/lift and lower) Assist level from toilet: Touching or steadying assistance (Pt > 75%)  Function - Chair/bed transfer Chair/bed transfer method: Squat pivot, Stand pivot Chair/bed transfer assist level: Moderate assist (Pt 50 - 74%/lift or lower) Chair/bed transfer assistive device: Armrests Chair/bed transfer details: Tactile cues for sequencing, Verbal cues for sequencing, Verbal cues for technique, Verbal cues  for precautions/safety, Tactile cues for weight shifting  Function - Locomotion: Wheelchair Will patient use wheelchair at discharge?: Yes Type: Manual Max wheelchair distance: 123f Assist Level: Supervision or verbal cues Assist Level: Supervision or verbal cues Wheel 150 feet activity did not occur: Safety/medical concerns Assist Level: Supervision or verbal cues Turns around,maneuvers to table,bed, and toilet,negotiates 3% grade,maneuvers on rugs and over doorsills: No Function - Locomotion: Ambulation Assistive device: Rail in hallway Max distance: 30 ft Assist level: Moderate assist (Pt 50 - 74%) Assist level: Moderate assist (Pt 50 - 74%) Walk 50 feet with 2 turns activity did not occur: Safety/medical concerns Walk 150 feet activity did not occur: Safety/medical concerns Walk 10 feet on uneven surfaces activity did not occur: Safety/medical concerns  Function - Comprehension Comprehension: Auditory Comprehension assist level: Follows basic conversation/direction with extra time/assistive device, Understands basic 90% of the time/cues < 10% of the time  Function - Expression Expression: Verbal Expression assistive device: Communication board Expression assist level: Expresses basic 25 - 49% of the time/requires cueing 50 - 75% of the time. Uses single words/gestures.  Function - Social Interaction Social Interaction assist level: Interacts appropriately 50 - 74% of the time - May be physically or verbally inappropriate.  Function - Problem Solving Problem solving assist level: Solves basic 50 - 74% of the time/requires cueing 25 - 49% of the time, Solves basic 75 - 89% of the time/requires cueing 10 - 24% of the time  Function - Memory Memory assist level: Recognizes or recalls 50 - 74% of the time/requires cueing 25 - 49% of the time, Recognizes or recalls 75 - 89% of the time/requires cueing 10 - 24% of the time Patient normally able to recall (first 3 days only): Staff  names and faces, That he or she is in a hospital   Medical Problem List and Plan: 1.  Right hemiparesis, dysphagia, dysarthria secondary to embolic cerebral infarcts, most significant insult in  the left basal ganglia. History of CVA 2017 with right-sided residual weakness CIR, PT, OT, SLP 2.  DVT Prophylaxis/Anticoagulation: Subcutaneous Lovenox. Venous Doppler studies negative, PLT 137K monitor 3. Pain Management: Neurontin 300 mg daily at bedtime 4. Mood: Ego support 5. Neuropsych: This patient is capable of making decisions on his own behalf. 6. Skin/Wound Care:  Routine skin checks 7. Fluids/Electrolytes/Nutrition:  Routine I&O with follow-up chemistries 8. Dysphagia. Dysphagia #1 honey liquids. Monitor hydration. Follow-up speech therapy 9.Hypertension. Lisinopril 5 mg daily- Monitor 10/4 Vitals:   10/29/16 2300 10/30/16 0450  BP: (!) 167/94 (!) 158/77  Pulse: 75 68  Resp:    Temp:  98 F (36.7 C)  SpO2:  100%   10. Diabetes mellitus peripheral neuropathy. Hemoglobin A1c 8.9. SSI.Check blood sugars before meals and at bedtime. Patient on Glucotrol 5 mg twice a day, Glucophage 1000 mg twice a day. Resume as needed 11.Polysubstance abuse. UDS positive for cocaine. Provide counseling with Neuropsych 12.History polycythemia. Follow-up labs 13. ?PBA: Monitor.  Will consider Neuropsych, improved this am , on sertraline 14.  Buccal surface laceration likely related to dyscoordinated oral movements, left side over salivary duct will schedule MMW 15.  Hypoalbuminemia- start prostat LOS (Days) 3 A FACE TO FACE EVALUATION WAS PERFORMED  KIRSTEINS,ANDREW E 10/30/2016, 8:15 AM

## 2016-10-30 NOTE — Progress Notes (Signed)
Occupational Therapy Session Note  Patient Details  Name: Eric Morton MRN: [id number] Date of Birth: [birth year]  Today's Date: 10/30/2016 OT Individual Time: 0945-1100 OT Individual Time Calculation (min): 75 min    Short Term Goals: Week 1:  OT Short Term Goal 1 (Week 1): Pt will consistently recall hemi dressing technique with no more than 1 VC OT Short Term Goal 2 (Week 1): Pt will stand with min steadying assist while attempting to pull pants up OT Short Term Goal 3 (Week 1): Pt will complete1/3 toileting tasks in order to reduce caregiver burden OT Short Term Goal 4 (Week 1): Pt will thread B LEs into pants with close supervision in order to reduce caregiver burden  Skilled Therapeutic Interventions/Progress Updates:    Pt seen for OT ADL bathing/ dressing session. Pt sitting up in recliner upon arrival, agreeable to tx session. Throughout session he made needs known via gestures or communication board mod I. Desire to brush teeth and shower this morning. Pt requested total A for use of suction toothbrush to complete oral care. He completed stand pivot transfers throughout session with mod A, able to recall technique and correct hand placement. He bathed seated on drop arm BSC. Required total A for shoulder flexion in order to wash R arm pit.  He required assist to place R LE into figure four position, however, once placed pt able to wash/dry feet seated in this position.  He returned to w/c to dress, able to independently recall and initiate use of hemi techniwue. Despite requiring significantly increased time and effort, pt very motivated to independently thread on underwear/ pants with min steadying assist for dynamic balance seated in w/c when reaching to floor to thread. He stood with mod A to stand and heavy steadying assist while pt pulled up pants, assist to manage over R hip. Pt returned to recliner at end of session, left seated with NT present to   Therapy  Documentation Precautions:  Precautions Precautions: Fall Restrictions Weight Bearing Restrictions: No Pain: Pain Assessment Pain Assessment: No/denies pain Pain Score: 0-No pain  See Function Navigator for Current Functional Status.   Therapy/Group: Individual Therapy  Lewis, Tyffani Foglesong C 10/30/2016, 7:15 AM

## 2016-10-30 NOTE — Progress Notes (Signed)
Physical Therapy Session Note  Patient Details  Name: Eric Morton MRN: [id number] Date of Birth: 1965/07/14  Today's Date: 10/30/2016 PT Individual Time: 0347-4259 PT Individual Time Calculation (min): 40 min   Short Term Goals: Week 1:  PT Short Term Goal 1 (Week 1): Pt will transfer with min assist from bed<>w/c PT Short Term Goal 2 (Week 1): Pt will ambulate x 50 ft with mod assist PT Short Term Goal 3 (Week 1): Pt will propel w/c 150 ft with min assist  Skilled Therapeutic Interventions/Progress Updates:   Pt received sitting on toilet with NT present, pt agreeable to PT.   [place] transfer with min assist to [place]. Pt requested to eat grits prior to therapy. PT instructed pt in sitting balance at edge of seat while eating with cues for safety to prevent pocketing.   WC mobility with supervision assist from PT using L hemi technique x 261f. Pt required cues for improved turn control and doorway management.   Stair negotiation training with PT x 8 steps (3") with mod assist progressing to max assist due to fatigue. PT provided max cues for gait pattern and improved RLE neuromotor control.   Gait training with HW x 15 with min mod assist progressing to max assist due to increased R knee instability and decreased postural control with fatigue, causing LOB to the R. Pt able to correct intermittently with max cues to maintain standing balance with min assist .   Sit<>stand transfers completed x 5 throughout treatment with min-mod assist from PT..Marland KitchenIntermittent cues for proper anterior weight shift and UE placement to improve the safety of transfer.    Patient returned too room and left sitting in recliner with call bell in reach and all needs met.         Therapy Documentation Precautions:  Precautions Precautions: Fall Restrictions Weight Bearing Restrictions: No Vital Signs: Therapy Vitals Temp: 98 F (36.7 C) Temp Source: Oral Pulse Rate: 68 BP: (!) 158/77 Patient Position  (if appropriate): Lying Oxygen Therapy SpO2: 100 % O2 Device: Not Delivered Pain: Pain Assessment Pain Assessment: No/denies pain Pain Score: 0-No pain   See Function Navigator for Current Functional Status.   Therapy/Group: Individual Therapy  [name]10/04/2016, 8:48 AM

## 2016-10-31 ENCOUNTER — Inpatient Hospital Stay (HOSPITAL_COMMUNITY): Payer: No Typology Code available for payment source | Admitting: Occupational Therapy

## 2016-10-31 ENCOUNTER — Inpatient Hospital Stay (HOSPITAL_COMMUNITY): Payer: No Typology Code available for payment source

## 2016-10-31 ENCOUNTER — Inpatient Hospital Stay (HOSPITAL_COMMUNITY): Payer: No Typology Code available for payment source | Admitting: Speech Pathology

## 2016-10-31 LAB — GLUCOSE, CAPILLARY
Glucose-Capillary: 113 mg/dL — ABNORMAL HIGH (ref 65–99)
Glucose-Capillary: 139 mg/dL — ABNORMAL HIGH (ref 65–99)
Glucose-Capillary: 120 mg/dL — ABNORMAL HIGH (ref 65–99)
Glucose-Capillary: 124 mg/dL — ABNORMAL HIGH (ref 65–99)

## 2016-10-31 NOTE — Progress Notes (Signed)
Subjective/Complaints: Severe dysarthria but can say "I'm ok"  ROS- not able to obtain full review due to communication issues, Objective: Vital Signs: Blood pressure (!) 170/82, pulse 71, temperature 97.7 F (36.5 C), temperature source Oral, resp. rate 17, height 5\' 7"  (1.702 m), weight 74.8 kg (165 lb), SpO2 100 %. No results found. Results for orders placed or performed during the hospital encounter of 10/27/16 (from the past 72 hour(s))  Glucose, capillary     Status: Abnormal   Collection Time: 10/28/16 12:54 PM  Result Value Ref Range   Glucose-Capillary 180 (H) 65 - 99 mg/dL  Glucose, capillary     Status: Abnormal   Collection Time: 10/28/16  4:53 PM  Result Value Ref Range   Glucose-Capillary 160 (H) 65 - 99 mg/dL  Glucose, capillary     Status: Abnormal   Collection Time: 10/28/16  9:26 PM  Result Value Ref Range   Glucose-Capillary 161 (H) 65 - 99 mg/dL   Comment 1 Notify RN   Glucose, capillary     Status: Abnormal   Collection Time: 10/29/16  6:37 AM  Result Value Ref Range   Glucose-Capillary 120 (H) 65 - 99 mg/dL   Comment 1 Notify RN   Glucose, capillary     Status: Abnormal   Collection Time: 10/29/16 12:00 PM  Result Value Ref Range   Glucose-Capillary 144 (H) 65 - 99 mg/dL  Glucose, capillary     Status: Abnormal   Collection Time: 10/29/16  5:21 PM  Result Value Ref Range   Glucose-Capillary 125 (H) 65 - 99 mg/dL  Glucose, capillary     Status: Abnormal   Collection Time: 10/29/16  8:50 PM  Result Value Ref Range   Glucose-Capillary 119 (H) 65 - 99 mg/dL  Glucose, capillary     Status: None   Collection Time: 10/30/16  6:29 AM  Result Value Ref Range   Glucose-Capillary 98 65 - 99 mg/dL  Glucose, capillary     Status: Abnormal   Collection Time: 10/30/16 12:07 PM  Result Value Ref Range   Glucose-Capillary 123 (H) 65 - 99 mg/dL  Glucose, capillary     Status: Abnormal   Collection Time: 10/30/16  4:46 PM  Result Value Ref Range    Glucose-Capillary 125 (H) 65 - 99 mg/dL  Glucose, capillary     Status: Abnormal   Collection Time: 10/30/16  9:41 PM  Result Value Ref Range   Glucose-Capillary 139 (H) 65 - 99 mg/dL   Comment 1 Notify RN   Glucose, capillary     Status: Abnormal   Collection Time: 10/31/16  6:18 AM  Result Value Ref Range   Glucose-Capillary 113 (H) 65 - 99 mg/dL     HEENT: honey liquid spillage R corner of mouth Cardio: RRR and no murmur Resp: CTA B/L and unlabored GI: BS positive and NT, ND Extremity:  No Edema Skin:   Intact Neuro: Abnormal Sensory difficult to assess due to dysarthria, Abnormal Motor 0/5 RUE trace elbow flex, 4- Left knee extensor, Dysarthric and no emotional lability- Musc/Skel:  Other mild tenderness to palpation over lumbar paraspinals Gen NAD   Assessment/Plan: 1. Functional deficits secondary to Right hemiparesis , dysarthria, dysphagia which require 3+ hours per day of interdisciplinary therapy in a comprehensive inpatient rehab setting. Physiatrist is providing close team supervision and 24 hour management of active medical problems listed below. Physiatrist and rehab team continue to assess barriers to discharge/monitor patient progress toward functional and medical goals. FIM: Function -  Bathing Position: Shower Body parts bathed by patient: Right arm, Chest, Abdomen, Front perineal area, Left lower leg, Back, Right upper leg, Left upper leg, Buttocks, Right lower leg Body parts bathed by helper: Left arm Assist Level: Touching or steadying assistance(Pt > 75%)  Function- Upper Body Dressing/Undressing What is the patient wearing?: Pull over shirt/dress Pull over shirt/dress - Perfomed by patient: Thread/unthread right sleeve, Pull shirt over trunk, Put head through opening Pull over shirt/dress - Perfomed by helper: Thread/unthread left sleeve Function - Lower Body Dressing/Undressing What is the patient wearing?: Underwear, Pants, Non-skid slipper  socks Position: Wheelchair/chair at sink Underwear - Performed by patient: Thread/unthread right underwear leg, Thread/unthread left underwear leg, Pull underwear up/down Underwear - Performed by helper: Thread/unthread right underwear leg, Pull underwear up/down Pants- Performed by patient: Thread/unthread left pants leg, Thread/unthread right pants leg, Pull pants up/down Pants- Performed by helper: Thread/unthread right pants leg, Pull pants up/down Non-skid slipper socks- Performed by patient: Don/doff right sock, Don/doff left sock Non-skid slipper socks- Performed by helper: Don/doff left sock, Don/doff right sock Assist for footwear: Partial/moderate assist Assist for lower body dressing: Touching or steadying assistance (Pt > 75%)  Function - Toileting Toileting steps completed by patient: Adjust clothing prior to toileting, Adjust clothing after toileting (gown) Toileting steps completed by helper: Adjust clothing prior to toileting, Performs perineal hygiene, Adjust clothing after toileting Toileting Assistive Devices: Grab bar or rail Assist level: Touching or steadying assistance (Pt.75%)  Function - Air cabin crew transfer assistive device: Grab bar Assist level to toilet: Maximal assist (Pt 25 - 49%/lift and lower) Assist level from toilet: Touching or steadying assistance (Pt > 75%)  Function - Chair/bed transfer Chair/bed transfer method: Squat pivot Chair/bed transfer assist level: Moderate assist (Pt 50 - 74%/lift or lower) Chair/bed transfer assistive device: Armrests Chair/bed transfer details: Tactile cues for sequencing, Verbal cues for sequencing, Verbal cues for technique, Verbal cues for precautions/safety, Tactile cues for weight shifting  Function - Locomotion: Wheelchair Will patient use wheelchair at discharge?: Yes Type: Manual Max wheelchair distance: 282ft Assist Level: Supervision or verbal cues Assist Level: Supervision or verbal cues Wheel  150 feet activity did not occur: Safety/medical concerns Assist Level: Supervision or verbal cues Turns around,maneuvers to table,bed, and toilet,negotiates 3% grade,maneuvers on rugs and over doorsills: No Function - Locomotion: Ambulation Assistive device: Walker-hemi Max distance: 15 Assist level: Maximal assist (Pt 25 - 49%) Assist level: Maximal assist (Pt 25 - 49%) Walk 50 feet with 2 turns activity did not occur: Safety/medical concerns Walk 150 feet activity did not occur: Safety/medical concerns Walk 10 feet on uneven surfaces activity did not occur: Safety/medical concerns  Function - Comprehension Comprehension: Auditory Comprehension assist level: Follows basic conversation/direction with extra time/assistive device, Understands basic 90% of the time/cues < 10% of the time  Function - Expression Expression: Verbal Expression assistive device: Communication board Expression assist level: Expresses basic 75 - 89% of the time/requires cueing 10 - 24% of the time. Needs helper to occlude trach/needs to repeat words.  Function - Social Interaction Social Interaction assist level: Interacts appropriately 75 - 89% of the time - Needs redirection for appropriate language or to initiate interaction., Interacts appropriately 90% of the time - Needs monitoring or encouragement for participation or interaction.  Function - Problem Solving Problem solving assist level: Solves basic 75 - 89% of the time/requires cueing 10 - 24% of the time  Function - Memory Memory assist level: Recognizes or recalls 75 - 89% of the time/requires  cueing 10 - 24% of the time Patient normally able to recall (first 3 days only): Staff names and faces, That he or she is in a hospital, Current season   Medical Problem List and Plan: 1.  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  DVT Prophylaxis/Anticoagulation: Subcutaneous Lovenox. Venous Doppler studies negative, PLT 137K monitor 3. Pain Management: Neurontin 300 mg daily at bedtime 4. Mood: Ego support 5. Neuropsych: This patient is capable of making decisions on his own behalf. 6. Skin/Wound Care:  Routine skin checks 7. Fluids/Electrolytes/Nutrition:  Routine I&O with follow-up chemistries 8. Dysphagia. Dysphagia #1 honey liquids. Monitor hydration. Follow-up speech therapy 9.Hypertension. Lisinopril 5 mg daily- elevated systolic 70/0 Vitals:   17/49/44 1955 10/31/16 0424  BP: 138/78 (!) 170/82  Pulse: 77 71  Resp:  17  Temp:  97.7 F (36.5 C)  SpO2: 100% 100%   10. Diabetes mellitus peripheral neuropathy. Hemoglobin A1c 8.9. SSI.Check blood sugars before meals and at bedtime. Patient on Glucotrol 5 mg twice a day, Glucophage 1000 mg twice a day. Resume as needed 11.Polysubstance abuse. UDS positive for cocaine. Provide counseling with Neuropsych 12.History polycythemia. Follow-up labs 13. ?PBA: Monitor.  Will consider Neuropsych, improved , on sertraline 14.  Buccal surface laceration likely related to dyscoordinated oral movements, left side over salivary duct will schedule MMW 15.  Hypoalbuminemia- start prostat LOS (Days) 4 A FACE TO FACE EVALUATION WAS PERFORMED  Creta Dorame E 10/31/2016, 8:41 AM
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
## 2016-10-31 NOTE — Progress Notes (Signed)
Speech Language Pathology Daily Session Note  Patient Details  Name: Eric Morton MRN: [id number] Date of Birth: Jul 21, 1965  Today's Date: 10/31/2016 SLP Individual Time: 0850-0930 SLP Individual Time Calculation (min): 40 min  Short Term Goals: Week 1: SLP Short Term Goal 1 (Week 1): Pt will consume current diet with Min s/s asiration with Mod A verbal cues for use of swallowing compensatory strategies. SLP Short Term Goal 2 (Week 1): Pt will tolerate trials of nectar thick liquids with use of compensatory strategies with Min s/s aspiration over 3 observed sessions to demonstrate readiness for diet advancement. SLP Short Term Goal 3 (Week 1): Pt will tolerate trials of Dys 2 with use of compensatory strategies with Min s/s aspiration and oral residue over 3 observed sessions to demonstrate readiness for diet advancement. SLP Short Term Goal 4 (Week 1): Pt will demonstrate effective use of communication board to express wants/needs at word level with Min A multimodal cues.  SLP Short Term Goal 5 (Week 1): Pt will demonstrate functional problem solving for basic and familiar tasks with Mod A verbal cues. SLP Short Term Goal 6 (Week 1): Pt will utilize speech intelligibility strategies at word level with Max A verbal cues to acheive 50% intelligibility.   Skilled Therapeutic Interventions:  Pt was seen for skilled ST targeting goals for communication and dysphagia.  Pt was consuming breakfast upon arrival with full supervision from nursing.  Pt demonstrated x1 immediate cough on thickened liquids via teaspoon which SLP suspects to be related to head positioning as pt was noted to throw his head backwards for transit of bolus.  When head was maintained in neutral position, pt demonstrated no overt s/s of aspiration.  Pt utilized his communication board to convey his immediate needs and wants to therapist with min assist verbal cues to recognize and correct errors.  Pt needed max assist verbal cues for  articulation at the syllabic level during structured speech tasks.  Pt was left in wheelchair with call bell within reach.  Continue per current plan of care.    Function:  Eating Eating   Modified Consistency Diet: Yes Eating Assist Level: Set up assist for;Supervision or verbal cues;Helper checks for pocketed food;More than reasonable amount of time   Eating Set Up Assist For: Opening containers       Cognition Comprehension Comprehension assist level: Follows basic conversation/direction with extra time/assistive device;Understands basic 90% of the time/cues < 10% of the time  Expression Expression assistive device: Communication board Expression assist level: Expresses basic 75 - 89% of the time/requires cueing 10 - 24% of the time. Needs helper to occlude trach/needs to repeat words.  Social Interaction Social Interaction assist level: Interacts appropriately 90% of the time - Needs monitoring or encouragement for participation or interaction.  Problem Solving Problem solving assist level: Solves basic 75 - 89% of the time/requires cueing 10 - 24% of the time  Memory Memory assist level: Recognizes or recalls 75 - 89% of the time/requires cueing 10 - 24% of the time    Pain Pain Assessment Pain Assessment: No/denies pain  Therapy/Group: Individual Therapy  Shem Plemmons, Selinda Orion 10/31/2016, 10:36 AM

## 2016-10-31 NOTE — Progress Notes (Signed)
Physical Therapy Session Note  Patient Details  Name: Eric Morton MRN: [id number] Date of Birth: [birth year]  Today's Date: 10/31/2016 PT Individual Time: 1305-1350 PT Individual Time Calculation (min): 45 min   Short Term Goals: Week 1:  PT Short Term Goal 1 (Week 1): Pt will transfer with min assist from bed<>w/c PT Short Term Goal 2 (Week 1): Pt will ambulate x 50 ft with mod assist PT Short Term Goal 3 (Week 1): Pt will propel w/c 150 ft with min assist  Skilled Therapeutic Interventions/Progress Updates:    Session focused on neuro re-ed during functional transfers, transitional movements, sit <> stands, and gait. Mod assist for squat pivot transfers with focus on control and technique. Pt demonstrates decreased weightbearing on RLE but improved with forced use and blocked practice during sit <> stands without UE support with facilitation for weightshift initially. Pre-gait with hemi walker for forward and backward stepping but pt unable to maintain hemiwalker with contact on floor very well, so moved to rail in hallway for more support. Able to gait x 30' with overall mod assist for facilitation of weighshift and support of trunk while PT facilitating R knee extension during stance phase. Pt able to advance RLE through swing phrase with PT providing adequate weightshift. Returned to room end of session with all needs in reach.   Therapy Documentation Precautions:  Precautions Precautions: Fall Restrictions Weight Bearing Restrictions: No  Pain:  Denies pain.   See Function Navigator for Current Functional Status.   Therapy/Group: Individual Therapy  Canary Brim Ivory Broad, PT, DPT  10/31/2016, 3:50 PM

## 2016-11-01 ENCOUNTER — Inpatient Hospital Stay (HOSPITAL_COMMUNITY): Payer: No Typology Code available for payment source | Admitting: Occupational Therapy

## 2016-11-01 ENCOUNTER — Inpatient Hospital Stay (HOSPITAL_COMMUNITY): Payer: Medicaid Other | Admitting: Speech Pathology

## 2016-11-01 DIAGNOSIS — I10 Essential (primary) hypertension: Secondary | ICD-10-CM

## 2016-11-01 DIAGNOSIS — E1159 Type 2 diabetes mellitus with other circulatory complications: Secondary | ICD-10-CM

## 2016-11-01 LAB — GLUCOSE, CAPILLARY
GLUCOSE-CAPILLARY: 92 mg/dL (ref 65–99)
GLUCOSE-CAPILLARY: 148 mg/dL — AB (ref 65–99)
Glucose-Capillary: 182 mg/dL — ABNORMAL HIGH (ref 65–99)
Glucose-Capillary: 111 mg/dL — ABNORMAL HIGH (ref 65–99)

## 2016-11-01 MED ORDER — LISINOPRIL 10 MG PO TABS
10.0000 mg | ORAL_TABLET | Freq: Every day | ORAL | Status: DC
  Administered 2016-11-02 – 2016-11-06 (×5): 10 mg via ORAL
  Filled 2016-11-01 (×5): qty 1

## 2016-11-01 NOTE — Progress Notes (Signed)
Eric Morton is a 51 y.o. male 1965-10-25 [id number]  Subjective: No new complaints. No new problems. Takes 10mg  lisnopril PTA per his report  Objective: Vital signs in last 24 hours: Temp:  [97.9 F (36.6 C)-98.5 F (36.9 C)] 97.9 F (36.6 C) (10/06 0550) Pulse Rate:  [71-73] 73 (10/06 0550) Resp:  [18] 18 (10/06 0550) BP: (158-185)/(68-92) 185/92 (10/06 0550) SpO2:  [100 %] 100 % (10/06 0550) Weight change:  Last BM Date: 10/30/16  Intake/Output from previous day: 10/05 0701 - 10/06 0700 In: 60 [P.O.:60] Out: 450 [Urine:450]  Physical Exam General: No apparent distress  In chair, severe aphasia and communicates by spelling out words with alphabet chart  Lungs: Normal effort. Lungs clear to auscultation, no crackles or wheezes. Cardiovascular: Regular rate and rhythm, no edema Neurological: No new neurological deficits   Lab Results: BMET    Component Value Date/Time   NA 138 10/28/2016 0431   NA 136 06/06/2016 0944   K 3.6 10/28/2016 0431   CL 105 10/28/2016 0431   CO2 24 10/28/2016 0431   GLUCOSE 116 (H) 10/28/2016 0431   BUN 11 10/28/2016 0431   BUN 8 06/06/2016 0944   CREATININE 1.03 10/28/2016 0431   CREATININE 0.86 05/04/2014 1208   CALCIUM 8.7 (L) 10/28/2016 0431   GFRNONAA >60 10/28/2016 0431   GFRAA >60 10/28/2016 0431   CBC    Component Value Date/Time   WBC 5.4 10/28/2016 0431   RBC 4.53 10/28/2016 0431   HGB 12.8 (L) 10/28/2016 0431   HGB 15.0 09/23/2016 1251   HCT 39.7 10/28/2016 0431   HCT 45.4 09/23/2016 1251   PLT 137 (L) 10/28/2016 0431   PLT 131 (L) 09/23/2016 1251   MCV 87.6 10/28/2016 0431   MCV 88.2 09/23/2016 1251   MCH 28.3 10/28/2016 0431   MCHC 32.2 10/28/2016 0431   RDW 13.3 10/28/2016 0431   RDW 13.8 09/23/2016 1251   LYMPHSABS 2.3 10/28/2016 0431   LYMPHSABS 1.8 09/23/2016 1251   MONOABS 0.6 10/28/2016 0431   MONOABS 0.4 09/23/2016 1251   EOSABS 0.4 10/28/2016 0431   EOSABS 0.4 09/23/2016 1251   BASOSABS 0.0  10/28/2016 0431   BASOSABS 0.0 09/23/2016 1251   CBG's (last 3):    Recent Labs  10/31/16 2137 11/01/16 0639 11/01/16 1211  GLUCAP 124* 92 148*   LFT's Lab Results  Component Value Date   ALT 26 10/28/2016   AST 47 (H) 10/28/2016   ALKPHOS 68 10/28/2016   BILITOT 0.8 10/28/2016    Studies/Results: No results found.  Medications:  I have reviewed the patient's current medications. Scheduled Medications: . clopidogrel  75 mg Oral Daily  . enoxaparin (LOVENOX) injection  40 mg Subcutaneous Q24H  . folic acid  1 mg Oral Daily  . gabapentin  300 mg Oral QHS  . insulin aspart  0-9 Units Subcutaneous TID WC & HS  . magic mouthwash w/lidocaine  5 mL Oral TID AC  . multivitamin with minerals  1 tablet Oral Daily  . rosuvastatin  40 mg Oral q1800  . sertraline  25 mg Oral Daily  . thiamine  100 mg Oral Daily   Or  . thiamine  100 mg Intravenous Daily   PRN Medications: acetaminophen **OR** acetaminophen (TYLENOL) oral liquid 160 mg/5 mL **OR** acetaminophen, MUSCLE RUB, ondansetron **OR** ondansetron (ZOFRAN) IV, RESOURCE THICKENUP CLEAR, sodium chloride, sorbitol  Assessment/Plan: Principal Problem:   Hemiparesis affecting right side as late effect of stroke (HCC) Active Problems:   HTN (  hypertension)   DM (diabetes mellitus) ([place])   Hyperlipidemia   Dysarthria due to recent stroke   Dysphagia due to recent stroke   Embolic cerebral infarction ([place])   1. Embolic cerebral CVAs with R HP and severe dysarthria; hx CVA 2017 and R HP PTA - continue med mgmt of risk and support with CIR therapy 2. HTN - increase lisinopril dose now; monitor BP 3. DM2 w/ periph neuropathy, uncontrolled PTA - but controlled with SSI only IP - continue holding PTA meds and resume as needed based on glycemic levels 4. Hyperlipidemia - cont statin  Length of stay, days: 5  Eric Morton A. Asa Lente, MD 11/01/2016, 1:25 PM

## 2016-11-01 NOTE — Progress Notes (Signed)
Speech Language Pathology Daily Session Note  Patient Details  Name: Eric Morton MRN: [id number] Date of Birth: [birth year]  Today's Date: 11/01/2016 SLP Individual Time: 1345-1430 SLP Individual Time Calculation (min): 45 min  Short Term Goals: Week 1: SLP Short Term Goal 1 (Week 1): Pt will consume current diet with Min s/s asiration with Mod A verbal cues for use of swallowing compensatory strategies. SLP Short Term Goal 2 (Week 1): Pt will tolerate trials of nectar thick liquids with use of compensatory strategies with Min s/s aspiration over 3 observed sessions to demonstrate readiness for diet advancement. SLP Short Term Goal 3 (Week 1): Pt will tolerate trials of Dys 2 with use of compensatory strategies with Min s/s aspiration and oral residue over 3 observed sessions to demonstrate readiness for diet advancement. SLP Short Term Goal 4 (Week 1): Pt will demonstrate effective use of communication board to express wants/needs at word level with Min A multimodal cues.  SLP Short Term Goal 5 (Week 1): Pt will demonstrate functional problem solving for basic and familiar tasks with Mod A verbal cues. SLP Short Term Goal 6 (Week 1): Pt will utilize speech intelligibility strategies at word level with Max A verbal cues to acheive 50% intelligibility.   Skilled Therapeutic Interventions: Skilled treatment session focused on dysphagia and communication goals. SLP facilitated session by providing supervision verbal cues for use of swallowing compensatory strategies throughout meal of Dys. 1 textures with honey-thick liquids via tsp. Patient did not demonstrate any overt s/s of aspiration and was able to volitionally keep his oral cavity closed during AP transit in 50% of opportunities. Patient also named functional items with less than 25% intelligibility at the word level and Max A verbal, visual and articulatory cues. Patient also utilized Data processing manager to express functional information with  Mod I. Patient left upright in recliner with all needs within reach. Continue with current plan of care.       Function:  Eating Eating   Modified Consistency Diet: Yes Eating Assist Level: Set up assist for;Supervision or verbal cues;Helper checks for pocketed food;More than reasonable amount of time   Eating Set Up Assist For: Opening containers       Cognition Comprehension Comprehension assist level: Follows basic conversation/direction with extra time/assistive device;Understands basic 90% of the time/cues < 10% of the time  Expression Expression assistive device: Communication board Expression assist level: Expresses basic 75 - 89% of the time/requires cueing 10 - 24% of the time. Needs helper to occlude trach/needs to repeat words.  Social Interaction Social Interaction assist level: Interacts appropriately 90% of the time - Needs monitoring or encouragement for participation or interaction.  Problem Solving Problem solving assist level: Solves basic 75 - 89% of the time/requires cueing 10 - 24% of the time  Memory Memory assist level: Recognizes or recalls 75 - 89% of the time/requires cueing 10 - 24% of the time    Pain No/Denies Pain   Therapy/Group: Individual Therapy  Shenica Holzheimer 11/01/2016, 2:35 PM

## 2016-11-02 ENCOUNTER — Inpatient Hospital Stay (HOSPITAL_COMMUNITY): Payer: No Typology Code available for payment source | Admitting: Occupational Therapy

## 2016-11-02 ENCOUNTER — Inpatient Hospital Stay (HOSPITAL_COMMUNITY): Payer: No Typology Code available for payment source | Admitting: Physical Therapy

## 2016-11-02 LAB — GLUCOSE, CAPILLARY
GLUCOSE-CAPILLARY: 138 mg/dL — AB (ref 65–99)
Glucose-Capillary: 93 mg/dL (ref 65–99)
Glucose-Capillary: 115 mg/dL — ABNORMAL HIGH (ref 65–99)
Glucose-Capillary: 113 mg/dL — ABNORMAL HIGH (ref 65–99)

## 2016-11-02 NOTE — Progress Notes (Signed)
Occupational Therapy Session Note  Patient Details  Name: Eric Morton MRN: [id number] Date of Birth: 06-10-1965  Today's Date: 11/02/2016 OT Individual Time: 0700-0800 OT Individual Time Calculation (min): 60 min    Short Term Goals: Week 1:  OT Short Term Goal 1 (Week 1): Pt will consistently recall hemi dressing technique with no more than 1 VC OT Short Term Goal 2 (Week 1): Pt will stand with min steadying assist while attempting to pull pants up OT Short Term Goal 3 (Week 1): Pt will complete1/3 toileting tasks in order to reduce caregiver burden OT Short Term Goal 4 (Week 1): Pt will thread B LEs into pants with close supervision in order to reduce caregiver burden  Skilled Therapeutic Interventions/Progress Updates:    Pt seen for OT ADL bathing/dressing session.  Pt sleep in supine upon arrival, easily awoken and agreeable to tx session. He was excited to show therapist new R UE movement in supine supported position with increased time and effort to rotate shoulder. He transferred to EOB with min-mod A from flat bed with multimodal cuing for sequencing. Throughout session, he completed stand pivot transfers with mod A. He bathed seated on BSC, min A to obtain figure four position in order to wash B LEs and reaching through toilet hole to complete buttock hygiene. He returned to w/c to dress. He obtained clothing items with supervision from drawers. He dressed in seated position with min A for dynamic balance when reaching to thread on pants. He stood at sink with max steadying assist initially trying to pull pants up, however, ultimately requiring assist due to poor standing balance/ control. Pt left seated in w/c at end of session, all needs in reach.   Therapy Documentation Precautions:  Precautions Precautions: Fall Restrictions Weight Bearing Restrictions: No Pain: Pain Assessment Pain Assessment: No/denies pain  See Function Navigator for Current Functional  Status.   Therapy/Group: Individual Therapy  Lewis, Michael Walrath C 11/02/2016, 6:45 AM

## 2016-11-02 NOTE — Progress Notes (Signed)
Eric Morton is a 51 y.o. male 25-Dec-1965 [id number]  Subjective: Eyes are bothering him, "sick" but denies change in vision or pain in eyes. No other concerns. Holding pressure to B max sinus region with washcloth in L hand  Objective: Vital signs in last 24 hours: Temp:  [97.5 F (36.4 C)-97.8 F (36.6 C)] 97.5 F (36.4 C) (10/07 0500) Pulse Rate:  [73-78] 73 (10/07 0500) Resp:  [18] 18 (10/07 0500) BP: (137-148)/(67-95) 148/95 (10/07 0500) SpO2:  [100 %] 100 % (10/07 0500) Weight change:  Last BM Date: 10/30/16  Intake/Output from previous day: 10/06 0701 - 10/07 0700 In: 120 [P.O.:120] Out: 600 [Urine:600]  Physical Exam General: No apparent distress   Communicates with alphabet board HEENT: B eyes prominent and FROM, PERRL - gross vision intact B, no conjunctivitis or drainage - no eyelid edema or erythema. Unable to appreciate nasal drainage. No clear tenderness to palpation of max sinus B face Lungs: Normal effort. Lungs clear to auscultation, no crackles or wheezes. Cardiovascular: Regular rate and rhythm, no edema Neurological: No new neurological deficits - dense dysarthria and R HP   Lab Results: BMET    Component Value Date/Time   NA 138 10/28/2016 0431   NA 136 06/06/2016 0944   K 3.6 10/28/2016 0431   CL 105 10/28/2016 0431   CO2 24 10/28/2016 0431   GLUCOSE 116 (H) 10/28/2016 0431   BUN 11 10/28/2016 0431   BUN 8 06/06/2016 0944   CREATININE 1.03 10/28/2016 0431   CREATININE 0.86 05/04/2014 1208   CALCIUM 8.7 (L) 10/28/2016 0431   GFRNONAA >60 10/28/2016 0431   GFRAA >60 10/28/2016 0431   CBC    Component Value Date/Time   WBC 5.4 10/28/2016 0431   RBC 4.53 10/28/2016 0431   HGB 12.8 (L) 10/28/2016 0431   HGB 15.0 09/23/2016 1251   HCT 39.7 10/28/2016 0431   HCT 45.4 09/23/2016 1251   PLT 137 (L) 10/28/2016 0431   PLT 131 (L) 09/23/2016 1251   MCV 87.6 10/28/2016 0431   MCV 88.2 09/23/2016 1251   MCH 28.3 10/28/2016 0431   MCHC 32.2  10/28/2016 0431   RDW 13.3 10/28/2016 0431   RDW 13.8 09/23/2016 1251   LYMPHSABS 2.3 10/28/2016 0431   LYMPHSABS 1.8 09/23/2016 1251   MONOABS 0.6 10/28/2016 0431   MONOABS 0.4 09/23/2016 1251   EOSABS 0.4 10/28/2016 0431   EOSABS 0.4 09/23/2016 1251   BASOSABS 0.0 10/28/2016 0431   BASOSABS 0.0 09/23/2016 1251   CBG's (last 3):   Recent Labs  11/01/16 1649 11/01/16 2102 11/02/16 0635  GLUCAP 182* 111* 93   LFT's Lab Results  Component Value Date   ALT 26 10/28/2016   AST 47 (H) 10/28/2016   ALKPHOS 68 10/28/2016   BILITOT 0.8 10/28/2016    Studies/Results: No results found.  Medications:  I have reviewed the patient's current medications. Scheduled Medications: . clopidogrel  75 mg Oral Daily  . enoxaparin (LOVENOX) injection  40 mg Subcutaneous Q24H  . folic acid  1 mg Oral Daily  . gabapentin  300 mg Oral QHS  . insulin aspart  0-9 Units Subcutaneous TID WC & HS  . lisinopril  10 mg Oral Daily  . magic mouthwash w/lidocaine  5 mL Oral TID AC  . multivitamin with minerals  1 tablet Oral Daily  . rosuvastatin  40 mg Oral q1800  . sertraline  25 mg Oral Daily  . thiamine  100 mg Oral Daily   Or  .  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 Embolic CVA cerebral areas with R HP and severe dysarthria and dysphagia - continue meds for risk stratification and co-morbid dz control, Cont CIR support 2. "eyes sick" - unable to appreciate occular problem or  sinus issue on exam today - continue warm cloth as per pt pref and follow up as needed 3. HTN - improved BP with lisinopril added 10/6 - continue same and monitor 4. 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 Hyperlipidemia- cont statin  Length of stay, days: 6  Kelven Flater A. Asa Lente, MD 11/02/2016, 10:11 AM

## 2016-11-02 NOTE — Progress Notes (Signed)
Physical Therapy Note  Patient Details  Name: Eric Morton MRN: [id number] Date of Birth: [birth year] Today's Date: 11/02/2016    Time: 810-900 50 minutes  1:1 No c/o pain.  Pt needing to eat breakfast upon PT arrival. PT supervised pt eating breakfast with cues for small bites. Pt with occasional delayed initiation of swallow, but able to perform with increased time.  Pt requires min cues to clear pocketing.  W/c mobility with hemi technique x 50' with supervision.  Standing balance with reaching task with focus on Rt hip and knee activation with mod A.  Standing mini squats with focus on knee control with mod A, manual facilitation at Rt hip and knee.  Sit to stands without UE support with focus on midline wt bearing with min A.  Pt with good ability to communicate with use of communication board.  Time 2: 1305-1335 30 minutes  1:1 No c/o pain.  Pt performs squat pivot transfers with min A throughout session.  Sit to stands with Lt LE on balance foam to promote Rt LE strength and wt bearing. Pt performed 2 x 5 with min/mod A with manual facilitaiton at Rt hips and knee.  Standing with Lt foot performing toe taps with focus on Lt LE wt bearing and stance control with tactile cues at glutes for upright posture, pt improves with repetition.  AAROM Rt LE knee flex/ext with pt with improving knee flexion strength.  Pt handed off to OT at end of session.   DONAWERTH,KAREN 11/02/2016, 9:00 AM

## 2016-11-02 NOTE — Progress Notes (Signed)
Occupational Therapy Session Note  Patient Details  Name: Eric Morton MRN: [id number] Date of Birth: December 16, 1965  Today's Date: 11/02/2016 OT Individual Time: 5573-2202 OT Individual Time Calculation (min): 52 min   Short Term Goals: Week 1:  OT Short Term Goal 1 (Week 1): Pt will consistently recall hemi dressing technique with no more than 1 VC OT Short Term Goal 2 (Week 1): Pt will stand with min steadying assist while attempting to pull pants up OT Short Term Goal 3 (Week 1): Pt will complete1/3 toileting tasks in order to reduce caregiver burden OT Short Term Goal 4 (Week 1): Pt will thread B LEs into pants with close supervision in order to reduce caregiver burden  Skilled Therapeutic Interventions/Progress Updates:    Tx focus on Rt NMR during meaningful activity.  Pt greeted via PT handoff in room. Agreeable to session. Pt self propelled w/c down hallway utilizing hemi technique. Required Min A due to Rt veering at times, but actively problem solving and correcting himself. Once in dayroom, had him engage in towel slide and dancing activities for Rt NMR. Pt visibly motivated when dancing to 80s R&B music. Worked on bilateral/reciprocal shoulder and trunk movements with mirror for visual biofeedback. Pt smiling and indicating to replay certain songs (therefore tx time slightly extended). At end of tx pt was returned to room and left with all needs within reach.   Pt communicating wants/needs throughout session with use of communication board.   Therapy Documentation Precautions:  Precautions Precautions: Fall Restrictions Weight Bearing Restrictions: No General:   Vital Signs: Therapy Vitals Temp: 97.7 F (36.5 C) Temp Source: Oral Pulse Rate: 77 Resp: 17 BP: 129/69 Patient Position (if appropriate): Sitting Oxygen Therapy SpO2: 100 % O2 Device: Not Delivered Pain: No c/o pain during tx    ADL:  :    See Function Navigator for Current Functional  Status.  Therapy/Group: Individual Therapy  Mckenna Boruff A Treyson Axel 11/02/2016, 4:11 PM

## 2016-11-03 ENCOUNTER — Inpatient Hospital Stay (HOSPITAL_COMMUNITY): Payer: No Typology Code available for payment source | Admitting: Occupational Therapy

## 2016-11-03 ENCOUNTER — Inpatient Hospital Stay (HOSPITAL_COMMUNITY): Payer: No Typology Code available for payment source | Admitting: Physical Therapy

## 2016-11-03 ENCOUNTER — Inpatient Hospital Stay (HOSPITAL_COMMUNITY): Payer: Medicaid Other

## 2016-11-03 ENCOUNTER — Inpatient Hospital Stay (HOSPITAL_COMMUNITY): Payer: No Typology Code available for payment source | Admitting: Speech Pathology

## 2016-11-03 LAB — GLUCOSE, CAPILLARY
GLUCOSE-CAPILLARY: 119 mg/dL — AB (ref 65–99)
GLUCOSE-CAPILLARY: 161 mg/dL — AB (ref 65–99)
Glucose-Capillary: 106 mg/dL — ABNORMAL HIGH (ref 65–99)
Glucose-Capillary: 128 mg/dL — ABNORMAL HIGH (ref 65–99)

## 2016-11-03 LAB — CREATININE, SERUM
Creatinine, Ser: 0.96 mg/dL (ref 0.61–1.24)
GFR calc Af Amer: >60 mL/min
GFR calc non Af Amer: >60 mL/min

## 2016-11-03 NOTE — Progress Notes (Signed)
Resting alert, cooperative appears to be asleep upon rounding eyes closed, respiration even and unlabored, no acute distress, VSS, denies pain upon questioning, refer to clinical assessment for additional information, Patient is refusing Magic mouthwash states " I don't need it" while using his communication tool , IVF tolerated well.site unremarkable, Encouraging po intake with resistance at time but did tolerate diet ;with full assistance of staff,Call bell placed on left side of bed within reach, ,

## 2016-11-03 NOTE — Progress Notes (Signed)
Subjective/Complaints: Severe dysarthria ,points to RLE, no pain   ROS- not able to obtain full review due to communication issues, Objective: Vital Signs: Blood pressure (!) 162/86, pulse 64, temperature 99.2 F (37.3 C), temperature source Oral, resp. rate 16, height '5\' 7"'  (1.702 m), weight 74.8 kg (165 lb), SpO2 100 %. No results found. Results for orders placed or performed during the hospital encounter of 10/27/16 (from the past 72 hour(s))  Glucose, capillary     Status: Abnormal   Collection Time: 10/31/16 11:53 AM  Result Value Ref Range   Glucose-Capillary 139 (H) 65 - 99 mg/dL  Glucose, capillary     Status: Abnormal   Collection Time: 10/31/16  4:58 PM  Result Value Ref Range   Glucose-Capillary 120 (H) 65 - 99 mg/dL  Glucose, capillary     Status: Abnormal   Collection Time: 10/31/16  9:37 PM  Result Value Ref Range   Glucose-Capillary 124 (H) 65 - 99 mg/dL   Comment 1 Notify RN   Glucose, capillary     Status: None   Collection Time: 11/01/16  6:39 AM  Result Value Ref Range   Glucose-Capillary 92 65 - 99 mg/dL   Comment 1 Notify RN   Glucose, capillary     Status: Abnormal   Collection Time: 11/01/16 12:11 PM  Result Value Ref Range   Glucose-Capillary 148 (H) 65 - 99 mg/dL  Glucose, capillary     Status: Abnormal   Collection Time: 11/01/16  4:49 PM  Result Value Ref Range   Glucose-Capillary 182 (H) 65 - 99 mg/dL  Glucose, capillary     Status: Abnormal   Collection Time: 11/01/16  9:02 PM  Result Value Ref Range   Glucose-Capillary 111 (H) 65 - 99 mg/dL   Comment 1 Notify RN   Glucose, capillary     Status: None   Collection Time: 11/02/16  6:35 AM  Result Value Ref Range   Glucose-Capillary 93 65 - 99 mg/dL   Comment 1 Notify RN   Glucose, capillary     Status: Abnormal   Collection Time: 11/02/16 11:50 AM  Result Value Ref Range   Glucose-Capillary 138 (H) 65 - 99 mg/dL  Glucose, capillary     Status: Abnormal   Collection Time: 11/02/16  5:27 PM   Result Value Ref Range   Glucose-Capillary 115 (H) 65 - 99 mg/dL  Glucose, capillary     Status: Abnormal   Collection Time: 11/02/16  9:47 PM  Result Value Ref Range   Glucose-Capillary 113 (H) 65 - 99 mg/dL   Comment 1 Notify RN   Creatinine, serum     Status: None   Collection Time: 11/03/16  5:58 AM  Result Value Ref Range   Creatinine, Ser 0.96 0.61 - 1.24 mg/dL   GFR calc non Af Amer >60 >60 mL/min   GFR calc Af Amer >60 >60 mL/min    Comment: (NOTE) The eGFR has been calculated using the CKD EPI equation. This calculation has not been validated in all clinical situations. eGFR's persistently <60 mL/min signify possible Chronic Kidney Disease.   Glucose, capillary     Status: Abnormal   Collection Time: 11/03/16  6:38 AM  Result Value Ref Range   Glucose-Capillary 106 (H) 65 - 99 mg/dL   Comment 1 Notify RN      HEENT: honey liquid spillage R corner of mouth Cardio: RRR and no murmur Resp: CTA B/L and unlabored GI: BS positive and NT, ND Extremity:  No  Edema Skin:   Intact Neuro: Abnormal Sensory difficult to assess due to dysarthria, Abnormal Motor 0/5 RUE trace elbow flex, 2- Left knee extensor, Dysarthric and no emotional lability- Musc/Skel:  Other mild tenderness to palpation over lumbar paraspinals Gen NAD   Assessment/Plan: 1. Functional deficits secondary to Right hemiparesis , dysarthria, dysphagia which require 3+ hours per day of interdisciplinary therapy in a comprehensive inpatient rehab setting. Physiatrist is providing close team supervision and 24 hour management of active medical problems listed below. Physiatrist and rehab team continue to assess barriers to discharge/monitor patient progress toward functional and medical goals. FIM: Function - Bathing Bathing activity did not occur: Refused Position: Shower Body parts bathed by patient: Right arm, Chest, Abdomen, Front perineal area, Left lower leg, Right upper leg, Left upper leg, Buttocks,  Right lower leg Body parts bathed by helper: Back, Left arm Assist Level: Touching or steadying assistance(Pt > 75%)  Function- Upper Body Dressing/Undressing What is the patient wearing?: Pull over shirt/dress Pull over shirt/dress - Perfomed by patient: Thread/unthread right sleeve, Put head through opening, Thread/unthread left sleeve, Pull shirt over trunk Pull over shirt/dress - Perfomed by helper: Pull shirt over trunk Assist Level: Supervision or verbal cues Function - Lower Body Dressing/Undressing What is the patient wearing?: Underwear, Pants, Non-skid slipper socks Position: Wheelchair/chair at sink Underwear - Performed by patient: Thread/unthread right underwear leg, Thread/unthread left underwear leg Underwear - Performed by helper: Pull underwear up/down Pants- Performed by patient: Thread/unthread left pants leg, Thread/unthread right pants leg, Pull pants up/down Pants- Performed by helper: Thread/unthread right pants leg, Pull pants up/down Non-skid slipper socks- Performed by patient: Don/doff right sock, Don/doff left sock Non-skid slipper socks- Performed by helper: Don/doff left sock, Don/doff right sock Assist for footwear: Partial/moderate assist Assist for lower body dressing: Touching or steadying assistance (Pt > 75%)  Function - Toileting Toileting steps completed by patient: Adjust clothing prior to toileting, Performs perineal hygiene, Adjust clothing after toileting Toileting steps completed by helper: Adjust clothing prior to toileting, Performs perineal hygiene, Adjust clothing after toileting Toileting Assistive Devices: Grab bar or rail Assist level: Touching or steadying assistance (Pt.75%)  Function - Air cabin crew transfer assistive device: Grab bar Assist level to toilet: Maximal assist (Pt 25 - 49%/lift and lower) Assist level from toilet: Touching or steadying assistance (Pt > 75%)  Function - Chair/bed transfer Chair/bed transfer  method: Squat pivot, Stand pivot Chair/bed transfer assist level: Moderate assist (Pt 50 - 74%/lift or lower) Chair/bed transfer assistive device: Armrests Chair/bed transfer details: Tactile cues for sequencing, Verbal cues for sequencing, Verbal cues for technique, Verbal cues for precautions/safety, Tactile cues for weight shifting  Function - Locomotion: Wheelchair Will patient use wheelchair at discharge?: Yes Type: Manual Max wheelchair distance: 231f Assist Level: Supervision or verbal cues Assist Level: Supervision or verbal cues Wheel 150 feet activity did not occur: Safety/medical concerns Assist Level: Supervision or verbal cues Turns around,maneuvers to table,bed, and toilet,negotiates 3% grade,maneuvers on rugs and over doorsills: No Function - Locomotion: Ambulation Assistive device: Rail in hallway Max distance: 30' Assist level: Moderate assist (Pt 50 - 74%) Assist level: 2 helpers (+2 for w/c follow; mod A otherwise) Walk 50 feet with 2 turns activity did not occur: Safety/medical concerns Walk 150 feet activity did not occur: Safety/medical concerns Walk 10 feet on uneven surfaces activity did not occur: Safety/medical concerns  Function - Comprehension Comprehension: Auditory Comprehension assist level: Follows basic conversation/direction with extra time/assistive device  Function - Expression Expression: Nonverbal  Expression assistive device: Communication board Expression assist level: Expresses basic 25 - 49% of the time/requires cueing 50 - 75% of the time. Uses single words/gestures.  Function - Social Interaction Social Interaction assist level: Interacts appropriately 50 - 74% of the time - May be physically or verbally inappropriate.  Function - Problem Solving Problem solving assist level: Solves basic 75 - 89% of the time/requires cueing 10 - 24% of the time  Function - Memory Memory assist level: Recognizes or recalls 75 - 89% of the  time/requires cueing 10 - 24% of the time Patient normally able to recall (first 3 days only): Staff names and faces, That he or she is in a hospital   Medical Problem List and Plan: 1.  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 History of CVA 2017 with right-sided residual weakness CIR, PT, OT, SLP- increased lower ext weakness on R side, will recheck CT head 2.  DVT Prophylaxis/Anticoagulation: Subcutaneous Lovenox. Venous Doppler studies negative, PLT 137K monitor 3. Pain Management: Neurontin 300 mg daily at bedtime 4. Mood: Ego support 5. Neuropsych: This patient is capable of making decisions on his own behalf. 6. Skin/Wound Care:  Routine skin checks 7. Fluids/Electrolytes/Nutrition:  Routine I&O with follow-up chemistries 8. Dysphagia. Dysphagia #1 honey liquids. Monitor hydration. Follow-up speech therapy 9.Hypertension. Lisinopril 30m daily increased on 10/6 - elevated systolic 112/5Vitals:   127/12/921507 11/03/16 0407  BP: 129/69 (!) 162/86  Pulse: 77 64  Resp: 17 16  Temp: 97.7 F (36.5 C) 99.2 F (37.3 C)  SpO2: 100% 100%   10. Diabetes mellitus peripheral neuropathy. Hemoglobin A1c 8.9. SSI.Check blood sugars before meals and at bedtime. Patient on Glucotrol 5 mg twice a day, Glucophage 1000 mg twice a day. Resume as needed CBG (last 3)   Recent Labs  11/02/16 1727 11/02/16 2147 11/03/16 0638  GLUCAP 115* 113* 106*  Controlled 10/8 11.Polysubstance abuse. UDS positive for cocaine. Provide counseling with Neuropsych 12.History polycythemia. Follow-up labs 13. ?PBA: Monitor.  Will consider Neuropsych, improved , on sertraline 14.  Buccal surface laceration likely related to dyscoordinated oral movements, left side over salivary duct will schedule MMW 15.  Hypoalbuminemia- start prostat LOS (Days) 7 A FACE TO FACE EVALUATION WAS PERFORMED  Caterine Mcmeans E 11/03/2016,  8:13 AM

## 2016-11-03 NOTE — Progress Notes (Signed)
Occupational Therapy Session Note  Patient Details  Name: Eric Morton MRN: [id number] Date of Birth: 1965-11-03  Today's Date: 11/03/2016 OT Individual Time: 0950-1100 OT Individual Time Calculation (min): 70 min    Short Term Goals: Week 1:  OT Short Term Goal 1 (Week 1): Pt will consistently recall hemi dressing technique with no more than 1 VC OT Short Term Goal 2 (Week 1): Pt will stand with min steadying assist while attempting to pull pants up OT Short Term Goal 3 (Week 1): Pt will complete1/3 toileting tasks in order to reduce caregiver burden OT Short Term Goal 4 (Week 1): Pt will thread B LEs into pants with close supervision in order to reduce caregiver burden  Skilled Therapeutic Interventions/Progress Updates:  Pt seen for OT session focusing on ADL re-training and functional transfers. Pt sitting up in w/c upon arrival, agreeable to tx session. Communication throughout session via communication board and gestures. He declined showering task this morning opting just to change clothes. He gathered clothing items from w/c level with supervision. Standing completed at sink with min-mod A to power up, hwoever, requiring initially mod progressing to max A for stand balance when fatigued.  Provided pt with elastic shoe laces. He took shoe laces out using R UE to assist with steadying. Therapist replaced with elastic shoelaces and educated regarding purpose. Pt then completed closed chair transfer training with squat pivot w/c <> toilet. Mod A when transfering to weaker L side, min A when transferring to strong side. Attempted standing for clothing management, however, due to grab bar being on R side and pt unable to utilize, required max A for standing balance. Attempted with hemi walker for balance. Progressed to mod A standing balance when using hemi walker, however, pt unable to maintain balance without UE support. PT returned to supine at end of session in prep for off unit procedure.  Min A squat pivot to bed and min A to return to supine. Left in supine in care of transport team.  Therapy Documentation Precautions:  Precautions Precautions: Fall Restrictions Weight Bearing Restrictions: No Pain:   No/ denies pain  See Function Navigator for Current Functional Status.   Therapy/Group: Individual Therapy  Lewis, Tonae Livolsi C 11/03/2016, 7:11 AM

## 2016-11-03 NOTE — Progress Notes (Signed)
Physical Therapy Session Note  Patient Details  Name: Eric Morton MRN: [id number] Date of Birth: 07-Sep-1965  Today's Date: 11/03/2016 PT Individual Time: 0805-0900 PT Individual Time Calculation (min): 55 min   Short Term Goals: Week 1:  PT Short Term Goal 1 (Week 1): Pt will transfer with min assist from bed<>w/c PT Short Term Goal 2 (Week 1): Pt will ambulate x 50 ft with mod assist PT Short Term Goal 3 (Week 1): Pt will propel w/c 150 ft with min assist  Skilled Therapeutic Interventions/Progress Updates: Pt presented in bed agreeable to therapy. Performed supine to sit minA at EOB. Squat pivot modA to w/c with cues for sequencing and hand placement. Pt propelled to rehab gym using hemi technique and required x 1 brief rest for fatigue. Performed gait training at wall x 30 ft with modA, PTA initially facilitating advancement of RLE and blocking R knee with cues for increased glute activation. Pt able to advance RLE with minA however continued to require R knee blocking and cues for hip stabilization. Pt required increased assist with fatigue with x 1 occurrence of B knees buckling. Performed sit to/from stand using hemi walker and mirror feedback x 5 with consistent minA. Cues to push down to maintain HW on flat surface and increasing wt bearing on RLE. Performed squat pivot to R and returned to room. Pt left with call bell and communication board within reach and with nsg present.      Therapy Documentation Precautions:  Precautions Precautions: Fall Restrictions Weight Bearing Restrictions: No General:   Vital Signs:  Pain: Pain Assessment Pain Assessment: No/denies pain Pain Score: 0-No pain   See Function Navigator for Current Functional Status.   Therapy/Group: Individual Therapy  Sacheen Arrasmith  Lilliona Blakeney, PTA  11/03/2016, 12:30 PM

## 2016-11-03 NOTE — Progress Notes (Signed)
Speech Language Pathology Daily Session Note  Patient Details  Name: Eric Morton MRN: [id number] Date of Birth: 1965-03-30  Today's Date: 11/03/2016 SLP Individual Time: 1405-1500 SLP Individual Time Calculation (min): 55 min  Short Term Goals: Week 1: SLP Short Term Goal 1 (Week 1): Pt will consume current diet with Min s/s asiration with Mod A verbal cues for use of swallowing compensatory strategies. SLP Short Term Goal 2 (Week 1): Pt will tolerate trials of nectar thick liquids with use of compensatory strategies with Min s/s aspiration over 3 observed sessions to demonstrate readiness for diet advancement. SLP Short Term Goal 3 (Week 1): Pt will tolerate trials of Dys 2 with use of compensatory strategies with Min s/s aspiration and oral residue over 3 observed sessions to demonstrate readiness for diet advancement. SLP Short Term Goal 4 (Week 1): Pt will demonstrate effective use of communication board to express wants/needs at word level with Min A multimodal cues.  SLP Short Term Goal 5 (Week 1): Pt will demonstrate functional problem solving for basic and familiar tasks with Mod A verbal cues. SLP Short Term Goal 6 (Week 1): Pt will utilize speech intelligibility strategies at word level with Max A verbal cues to acheive 50% intelligibility.   Skilled Therapeutic Interventions:  Pt was seen for skilled ST targeting goals for communication and dysphagia.  SLP facilitated the session with therapeutic trials of nectar thick liquids to continue working towards diet progression.  Pt demonstrated no overt s/s of aspiration with advanced liquids and vocal quality remained clear following trials; however, pt is a known silent aspirator due to impaired timing of swallow response in the setting of decreased oral cohesion of boluses.  Pt was stimulable for effortful swallow followed by volitional throat clear and second swallow with trials of nectar thick liquids in the hopes of improving airway  protection during the swallow.  Pt needed max to total assist cues for pacing and overarticulation to achieve intelligibility at the word level during a simple object naming task with incorporation of a visual barrier.  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  Pt was left in wheelchair with family friend providing transport back to pt's room.  Continue per current plan of care.       Function:  Eating Eating                 Cognition Comprehension Comprehension assist level: Follows basic conversation/direction with extra time/assistive device  Expression Expression assistive device: Communication board Expression assist level: Expresses basic 25 - 49% of the time/requires cueing 50 - 75% of the time. Uses single words/gestures.  Social Interaction Social Interaction assist level: Interacts appropriately 75 - 89% of the time - Needs redirection for appropriate language or to initiate interaction.  Problem Solving Problem solving assist level: Solves basic 75 - 89% of the time/requires cueing 10 - 24% of the time  Memory Memory assist level: Recognizes or recalls 75 - 89% of the time/requires cueing 10 - 24% of the time    Pain Pain Assessment Pain Assessment: No/denies pain  Therapy/Group: Individual Therapy  Chelsey Kimberley, Selinda Orion 11/03/2016, 4:23 PM

## 2016-11-04 ENCOUNTER — Inpatient Hospital Stay (HOSPITAL_COMMUNITY): Payer: No Typology Code available for payment source | Admitting: Physical Therapy

## 2016-11-04 ENCOUNTER — Inpatient Hospital Stay (HOSPITAL_COMMUNITY): Payer: No Typology Code available for payment source | Admitting: Speech Pathology

## 2016-11-04 ENCOUNTER — Inpatient Hospital Stay (HOSPITAL_COMMUNITY): Payer: No Typology Code available for payment source | Admitting: Occupational Therapy

## 2016-11-04 LAB — GLUCOSE, CAPILLARY
GLUCOSE-CAPILLARY: 104 mg/dL — AB (ref 65–99)
GLUCOSE-CAPILLARY: 174 mg/dL — AB (ref 65–99)
Glucose-Capillary: 162 mg/dL — ABNORMAL HIGH (ref 65–99)
Glucose-Capillary: 103 mg/dL — ABNORMAL HIGH (ref 65–99)

## 2016-11-04 NOTE — Plan of Care (Addendum)
Problem: RH Balance Goal: LTG: Patient will maintain dynamic sitting balance (OT) LTG:  Patient will maintain dynamic sitting balance with assistance during activities of daily living (OT)  Goal downgraded due to pt progress. Kellianne Ek Lewis, OTR/L Goal: LTG Patient will maintain dynamic standing with ADLs (OT) LTG:  Patient will maintain dynamic standing balance with assist during activities of daily living (OT)   Goal downgraded due to pt progress. Mataio Mele Lewis, OTR/L  Problem: RH Bathing Goal: LTG Patient will bathe with assist, cues/equipment (OT) LTG: Patient will bathe specified number of body parts with assist with/without cues using equipment (position)  (OT)  Goal downgraded due to pt progress. Kjersti Dittmer Lewis, OTR/L  Problem: RH Dressing Goal: LTG Patient will perform lower body dressing w/assist (OT) LTG: Patient will perform lower body dressing with assist, with/without cues in positioning using equipment (OT)  Goal downgraded due to pt progress. Donnald Tabar Lewis, OTR/L  Problem: RH Toileting Goal: LTG Patient will perform toileting w/assist, cues/equip (OT) LTG: Patient will perform toiletiing (clothes management/hygiene) with assist, with/without cues using equipment (OT)  Goal downgraded due to pt progress. Breanna Shorkey Lewis, OTR/L  Problem: RH Toilet Transfers Goal: LTG Patient will perform toilet transfers w/assist (OT) LTG: Patient will perform toilet transfers with assist, with/without cues using equipment (OT)  Goal downgraded due to pt progress. Napoleon Form, OTR/L
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
## 2016-11-04 NOTE — Progress Notes (Addendum)
Speech Language Pathology Daily Session Note  Patient Details  Name: Eric Morton MRN: [id number] Date of Birth: 03-09-1965  Today's Date: 11/04/2016 SLP Individual Time: 1035-1100; [id number] SLP Individual Time Calculation (min): 25 min; 28 min  Short Term Goals: Week 1: SLP Short Term Goal 1 (Week 1): Pt will consume current diet with Min s/s asiration with Mod A verbal cues for use of swallowing compensatory strategies. SLP Short Term Goal 2 (Week 1): Pt will tolerate trials of nectar thick liquids with use of compensatory strategies with Min s/s aspiration over 3 observed sessions to demonstrate readiness for diet advancement. SLP Short Term Goal 3 (Week 1): Pt will tolerate trials of Dys 2 with use of compensatory strategies with Min s/s aspiration and oral residue over 3 observed sessions to demonstrate readiness for diet advancement. SLP Short Term Goal 4 (Week 1): Pt will demonstrate effective use of communication board to express wants/needs at word level with Min A multimodal cues.  SLP Short Term Goal 5 (Week 1): Pt will demonstrate functional problem solving for basic and familiar tasks with Mod A verbal cues. SLP Short Term Goal 6 (Week 1): Pt will utilize speech intelligibility strategies at word level with Max A verbal cues to acheive 50% intelligibility.   Skilled Therapeutic Interventions:  Session 1: Pt was seen for skilled ST targeting dysphagia goals.  Pt consumed dys 1 textures and honey thick liquids with intermittent coughing following pureed grits which pt attributed to overseasoning with pepper.  No overt s/s of aspiration was evident with thickened liquids via teaspoon.  Therapist provided aggressive oral care to minimize bacterial load and prevent development of an infectious process.  RN to provide magic mouthwash at the end of today's therapy session to complete oral care.  Pt left in wheelchair with call bell within reach and family member at bedside.  Continue per  current plan of care.   Session 2: Pt was seen for skilled ST targeting communication goals.  Pt utilized his communication board to convey immediate needs/wants to therapist with supervision question cues.  He was able to produce simple, targeted CV consonants with max assist multimodal cues for overarticulation and slow rate.  Pt was left in wheelchair with call bell within reach.  Continue per current plan of care.    Function:  Eating Eating                 Cognition Comprehension Comprehension assist level: Follows basic conversation/direction with no assist  Expression Expression assistive device: Communication board Expression assist level: Expresses basic 50 - 74% of the time/requires cueing 25 - 49% of the time. Needs to repeat parts of sentences.  Social Interaction Social Interaction assist level: Interacts appropriately 90% of the time - Needs monitoring or encouragement for participation or interaction.  Problem Solving Problem solving assist level: Solves basic 90% of the time/requires cueing < 10% of the time  Memory Memory assist level: Recognizes or recalls 90% of the time/requires cueing < 10% of the time    Pain Pain Assessment Pain Assessment: No/denies pain  Therapy/Group: Individual Therapy  Brylee Berk, Selinda Orion 11/04/2016, 12:54 PM

## 2016-11-04 NOTE — Progress Notes (Signed)
Subjective/Complaints: Discussed CT, per PT seems a little stronger today than yesterday  ROS- not able to obtain full review due to communication issues, Objective: Vital Signs: Blood pressure (!) 164/71, pulse 75, temperature (!) 97.5 F (36.4 C), temperature source Oral, resp. rate 16, height '5\' 7"'  (1.702 m), weight 74.8 kg (165 lb), SpO2 100 %. Ct Head Wo Contrast  Result Date: 11/03/2016 CLINICAL DATA:  Stroke, focal neurological diaphysis. EXAM: CT HEAD WITHOUT CONTRAST TECHNIQUE: Contiguous axial images were obtained from the base of the skull through the vertex without intravenous contrast. COMPARISON:  MR brain 10/23/2016 and CT head 10/23/2016. FINDINGS: Brain: An area of progressive low-attenuation in the left basal ganglia corresponds to an acute infarct seen on 10/23/2016. Previously seen areas of micro infarction seen supratentorially, as well as within the brainstem, are poorly resolved today. No evidence of a new infarct. Old left cerebellar infarct. Mild atrophy. Periventricular low attenuation. No evidence of an acute hemorrhage, mass lesion, mass effect or hydrocephalus. Vascular: No hyperdense vessel or unexpected calcification. Skull: Normal. Negative for fracture or focal lesion. Sinuses/Orbits: Scattered opacification of ethmoid air cells. Orbits are grossly unremarkable. Other: None. IMPRESSION: 1. Evolving subacute left basal ganglia infarct. Previously seen areas of micro infarction seen supratentorially, as well as within the brainstem, are poorly resolved today. 2. Mild atrophy and chronic microvascular white matter ischemic changes. Electronically Signed   By: Lorin Picket M.D.   On: 11/03/2016 11:50   Results for orders placed or performed during the hospital encounter of 10/27/16 (from the past 72 hour(s))  Glucose, capillary     Status: Abnormal   Collection Time: 11/01/16 12:11 PM  Result Value Ref Range   Glucose-Capillary 148 (H) 65 - 99 mg/dL  Glucose,  capillary     Status: Abnormal   Collection Time: 11/01/16  4:49 PM  Result Value Ref Range   Glucose-Capillary 182 (H) 65 - 99 mg/dL  Glucose, capillary     Status: Abnormal   Collection Time: 11/01/16  9:02 PM  Result Value Ref Range   Glucose-Capillary 111 (H) 65 - 99 mg/dL   Comment 1 Notify RN   Glucose, capillary     Status: None   Collection Time: 11/02/16  6:35 AM  Result Value Ref Range   Glucose-Capillary 93 65 - 99 mg/dL   Comment 1 Notify RN   Glucose, capillary     Status: Abnormal   Collection Time: 11/02/16 11:50 AM  Result Value Ref Range   Glucose-Capillary 138 (H) 65 - 99 mg/dL  Glucose, capillary     Status: Abnormal   Collection Time: 11/02/16  5:27 PM  Result Value Ref Range   Glucose-Capillary 115 (H) 65 - 99 mg/dL  Glucose, capillary     Status: Abnormal   Collection Time: 11/02/16  9:47 PM  Result Value Ref Range   Glucose-Capillary 113 (H) 65 - 99 mg/dL   Comment 1 Notify RN   Creatinine, serum     Status: None   Collection Time: 11/03/16  5:58 AM  Result Value Ref Range   Creatinine, Ser 0.96 0.61 - 1.24 mg/dL   GFR calc non Af Amer >60 >60 mL/min   GFR calc Af Amer >60 >60 mL/min    Comment: (NOTE) The eGFR has been calculated using the CKD EPI equation. This calculation has not been validated in all clinical situations. eGFR's persistently <60 mL/min signify possible Chronic Kidney Disease.   Glucose, capillary     Status: Abnormal   Collection  Time: 11/03/16  6:38 AM  Result Value Ref Range   Glucose-Capillary 106 (H) 65 - 99 mg/dL   Comment 1 Notify RN   Glucose, capillary     Status: Abnormal   Collection Time: 11/03/16 11:49 AM  Result Value Ref Range   Glucose-Capillary 128 (H) 65 - 99 mg/dL  Glucose, capillary     Status: Abnormal   Collection Time: 11/03/16  5:10 PM  Result Value Ref Range   Glucose-Capillary 119 (H) 65 - 99 mg/dL  Glucose, capillary     Status: Abnormal   Collection Time: 11/03/16  9:20 PM  Result Value Ref  Range   Glucose-Capillary 161 (H) 65 - 99 mg/dL  Glucose, capillary     Status: Abnormal   Collection Time: 11/04/16  6:48 AM  Result Value Ref Range   Glucose-Capillary 104 (H) 65 - 99 mg/dL     HEENT:poor dentition Cardio: RRR and no murmur Resp: CTA B/L and unlabored GI: BS positive and NT, ND Extremity:  No Edema Skin:   Intact Neuro: Abnormal Sensory difficult to assess due to dysarthria, Abnormal Motor 0/5 RUE trace elbow flex, 2- Left knee extensor, Dysarthric and no emotional lability- Musc/Skel:  Other mild tenderness to palpation over lumbar paraspinals Gen NAD   Assessment/Plan: 1. Functional deficits secondary to Right hemiparesis , dysarthria, dysphagia which require 3+ hours per day of interdisciplinary therapy in a comprehensive inpatient rehab setting. Physiatrist is providing close team supervision and 24 hour management of active medical problems listed below. Physiatrist and rehab team continue to assess barriers to discharge/monitor patient progress toward functional and medical goals. FIM: Function - Bathing Bathing activity did not occur: Refused Position: Shower Body parts bathed by patient: Right arm, Chest, Abdomen, Front perineal area, Left lower leg, Right upper leg, Left upper leg, Buttocks, Right lower leg Body parts bathed by helper: Back, Left arm Assist Level: Touching or steadying assistance(Pt > 75%)  Function- Upper Body Dressing/Undressing What is the patient wearing?: Pull over shirt/dress Pull over shirt/dress - Perfomed by patient: Thread/unthread right sleeve, Put head through opening, Thread/unthread left sleeve, Pull shirt over trunk Pull over shirt/dress - Perfomed by helper: Pull shirt over trunk Assist Level: Supervision or verbal cues Function - Lower Body Dressing/Undressing What is the patient wearing?: Underwear, Pants, Non-skid slipper socks Position: Wheelchair/chair at sink Underwear - Performed by patient: Thread/unthread  right underwear leg, Thread/unthread left underwear leg Underwear - Performed by helper: Pull underwear up/down Pants- Performed by patient: Thread/unthread left pants leg, Thread/unthread right pants leg, Pull pants up/down Pants- Performed by helper: Thread/unthread right pants leg, Pull pants up/down Non-skid slipper socks- Performed by patient: Don/doff right sock, Don/doff left sock Non-skid slipper socks- Performed by helper: Don/doff left sock, Don/doff right sock Assist for footwear: Partial/moderate assist Assist for lower body dressing: Touching or steadying assistance (Pt > 75%)  Function - Toileting Toileting steps completed by patient: Adjust clothing prior to toileting, Performs perineal hygiene, Adjust clothing after toileting Toileting steps completed by helper: Adjust clothing prior to toileting, Performs perineal hygiene, Adjust clothing after toileting Toileting Assistive Devices: Grab bar or rail Assist level: Touching or steadying assistance (Pt.75%)  Function - Air cabin crew transfer assistive device: Grab bar Assist level to toilet: Maximal assist (Pt 25 - 49%/lift and lower) Assist level from toilet: Touching or steadying assistance (Pt > 75%)  Function - Chair/bed transfer Chair/bed transfer method: Stand pivot Chair/bed transfer assist level: Moderate assist (Pt 50 - 74%/lift or lower) Chair/bed transfer  assistive device: Armrests Chair/bed transfer details: Tactile cues for sequencing, Verbal cues for sequencing, Verbal cues for technique, Verbal cues for precautions/safety, Tactile cues for weight shifting  Function - Locomotion: Wheelchair Will patient use wheelchair at discharge?: Yes Type: Manual Max wheelchair distance: 277f Assist Level: Supervision or verbal cues Assist Level: Supervision or verbal cues Wheel 150 feet activity did not occur: Safety/medical concerns Assist Level: Supervision or verbal cues Turns around,maneuvers to  table,bed, and toilet,negotiates 3% grade,maneuvers on rugs and over doorsills: No Function - Locomotion: Ambulation Assistive device: Rail in hallway Max distance: 337fAssist level: Moderate assist (Pt 50 - 74%) Assist level: 2 helpers (+2 for w/c follow; mod A otherwise) Walk 50 feet with 2 turns activity did not occur: Safety/medical concerns Walk 150 feet activity did not occur: Safety/medical concerns Walk 10 feet on uneven surfaces activity did not occur: Safety/medical concerns  Function - Comprehension Comprehension: Auditory Comprehension assist level: Follows basic conversation/direction with no assist  Function - Expression Expression: Nonverbal Expression assistive device: Communication board Expression assist level: Expresses basic 90% of the time/requires cueing < 10% of the time.  Function - Social Interaction Social Interaction assist level: Interacts appropriately 90% of the time - Needs monitoring or encouragement for participation or interaction.  Function - Problem Solving Problem solving assist level: Solves basic 90% of the time/requires cueing < 10% of the time  Function - Memory Memory assist level: Recognizes or recalls 90% of the time/requires cueing < 10% of the time Patient normally able to recall (first 3 days only): Staff names and faces, That he or she is in a hospital   Medical Problem List and Plan: 1.  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  DVT Prophylaxis/Anticoagulation: Subcutaneous Lovenox. Venous Doppler studies negative, PLT 137K monitor 3. Pain Management: Neurontin 300 mg daily at bedtime 4. Mood: Ego support 5. Neuropsych: This patient is capable of making decisions on his own  behalf. 6. Skin/Wound Care:  Routine skin checks 7. Fluids/Electrolytes/Nutrition:  Routine I&O with follow-up chemistries 8. Dysphagia. Dysphagia #1 honey liquids. Monitor hydration. Follow-up speech therapy 9.Hypertension. Lisinopril 1019maily increased on 10/6 - elevated systolic 10/88/8y need to add second agent Vitals:   11/03/16 1619 11/04/16 0600  BP: (!) 149/83 (!) 164/71  Pulse: 73 75  Resp: 18 16  Temp: 98.5 F (36.9 C) (!) 97.5 F (36.4 C)  SpO2: 100% 100%   10. Diabetes mellitus peripheral neuropathy. Hemoglobin A1c 8.9. SSI.Check blood sugars before meals and at bedtime. Patient on Glucotrol 5 mg twice a day, Glucophage 1000 mg twice a day. Resume as needed CBG (last 3)   Recent Labs  11/03/16 1710 11/03/16 2120 11/04/16 0648  GLUCAP 119* 161* 104*  Controlled 10/9 11.Polysubstance abuse. UDS positive for cocaine. Provide counseling with Neuropsych 12.History polycythemia. Follow-up labs 13. ?PBA: Monitor.  Will consider Neuropsych, improved , on sertraline 14.  Buccal surface laceration likely related to dyscoordinated oral movements, left side over salivary duct will schedule MMW 15.  Hypoalbuminemia- start prostat LOS (Days) 8 A FACE TO FACE EVALUATION WAS PERFORMED  Jordie Schreur E 11/04/2016, 8:20 AM

## 2016-11-04 NOTE — Progress Notes (Addendum)
Occupational Therapy Weekly Progress Note  Patient Details  Name: Eric Morton MRN: [id number] Date of Birth: [birth year]  Beginning of progress report period: October 28, 2016 End of progress report period: November 04, 2016  Today's Date: 11/04/2016 OT Individual Time: 1100-1200 OT Individual Time Calculation (min): 60 min    Patient has met 2 of 5 short term goals.  Pt making slow but steady progress towards OT goals. He cont to be most limited by decreased functional standing balance and impaired use of R UE. Pt required mod-max A for functional standing balance when completing clothing management during LB bathing/dressing and toileting tasks. He requires min A for dynamic sitting balance in w/c when completing dressing tasks. Goals have been downgraded to min-mod A overall, see POC for goal details.   Patient continues to demonstrate the following deficits:abnormal posture, ataxia, cognitive deficits, flaccid hemiplegia and hemiparesis and muscle weakness (generalized) and therefore will continue to benefit from skilled OT intervention to enhance overall performance with BADL and Reduce care partner burden.  Patient not progressing toward long term goals.  See goal revision..  Plan of care revisions: Goals downgraded to min-mod A overall.  OT Short Term Goals Week 1:  OT Short Term Goal 1 (Week 1): Pt will consistently recall hemi dressing technique with no more than 1 VC OT Short Term Goal 1 - Progress (Week 1): Met OT Short Term Goal 2 (Week 1): Pt will stand with min steadying assist while attempting to pull pants up OT Short Term Goal 2 - Progress (Week 1): Not met OT Short Term Goal 3 (Week 1): Pt will complete 1/3 toileting tasks in order to reduce caregiver burden OT Short Term Goal 3 - Progress (Week 1): Met OT Short Term Goal 4 (Week 1): Pt will thread B LEs into pants with close supervision in order to reduce caregiver burden OT Short Term Goal 4 - Progress (Week 1):  Progressing toward goal Week 2:  OT Short Term Goal 1 (Week 2): Pt will complete 1/3 toileting tasks with min steadying assist OT Short Term Goal 2 (Week 2): Pt will complete 1 grooming task in standing with steadying assist to increase functional standing tolerance/balance OT Short Term Goal 3 (Week 2): Pt will consistently complete toilet transfers with min A  Skilled Therapeutic Interventions/Progress Updates:    Pt seen for OT ADL bathing/dressing session. Pt sitting up in w/c upon arrival, girlfriend present and pt agreeable to tx session and desiring to shower.  Completed stand pivot transfers with use of grab bar with min-mod A following knee buckling. He bathed seated on drop arm BSC. He returned to w/c to dress, assist required to obtain and maintain figure four position with R LE to thread pants and socks. He stood at sink with mod A to stand and mod-max for dynamic standing balance while puling up pants.  Discussed d/c planning with pt's girlfriend and requesting to talk to [place] regarding d/c. Educated regarding continuum of care and pt's options at d/c. CSW present discussing d/c with pt and girlfriend upon therapist exit.   Therapy Documentation Precautions:  Precautions Precautions: Fall Restrictions Weight Bearing Restrictions: No Pain:   No/ denies pain  See Function Navigator for Current Functional Status.   Therapy/Group: Individual Therapy  Lewis, Johan Antonacci C 11/04/2016, 7:11 AM

## 2016-11-04 NOTE — Progress Notes (Signed)
Physical Therapy Session Note  Patient Details  Name: Eric Morton MRN: [id number] Date of Birth: 1965-11-12  Today's Date: 11/04/2016 PT Individual Time: 0800-0900 and 1400-1430 PT Individual Time Calculation (min): 60 min and 30 min  Short Term Goals: Week 1:  PT Short Term Goal 1 (Week 1): Pt will transfer with min assist from bed<>w/c PT Short Term Goal 2 (Week 1): Pt will ambulate x 50 ft with mod assist PT Short Term Goal 3 (Week 1): Pt will propel w/c 150 ft with min assist  Skilled Therapeutic Interventions/Progress Updates: Tx1: Pt presented in bed agreeable to therapy. Performed supine to sit with use of bed rail min guard. PTA assisted donning pants,shoes, socks for time management. Performed squat pivot to bed minA, pt propelled to rehab gym supervision and pt's SO met pt en route. Performed sit to/from stand x 6 with minA and use of HW with visual feedback for emphasis on increasing rt wt shift. Pt demonstrated improved use of HW this session. Gait training with HW 23f, 130fwith modA. Pt demonstrating improved advancement of RLE but continues to require max multimodal cues for glute and quad activation with PTA blocking RLE to prevent buckling. Pt propelled back to room in same manner as prior and remained in w/c with call bell within reach, SO present, and needs met.   Tx2:  Pt presented in w/c agreeable to therapy. Session focused on dynamic sitting balance. Pt propelled to rehab gym supervision and performed squat pivot to mat minA. Instructed and performed scooting to R initially requiring mod then fading to minA. Participated in ball tossing reaching outside BOS with moderate challenges. Pt with x 1 LOB to R however was able to self correct. Pt returned to w/c minA and propelled back to room supervision. Pt remained in w/c at end of session with call bell within reach and needs met.      Therapy Documentation Precautions:  Precautions Precautions:  Fall Restrictions Weight Bearing Restrictions: No General:   Vital Signs: Therapy Vitals Temp: 97.6 F (36.4 C) Temp Source: Oral Pulse Rate: 76 Resp: 18 BP: (!) 153/91 Patient Position (if appropriate): Sitting Oxygen Therapy SpO2: 100 % O2 Device: Not Delivered Pain: Pain Assessment Pain Assessment: No/denies pain   See Function Navigator for Current Functional Status.   Therapy/Group: Individual Therapy  Cari Burgo  Brek Reece, PTA  11/04/2016, 3:54 PM

## 2016-11-05 ENCOUNTER — Inpatient Hospital Stay (HOSPITAL_COMMUNITY): Payer: No Typology Code available for payment source | Admitting: Occupational Therapy

## 2016-11-05 ENCOUNTER — Inpatient Hospital Stay (HOSPITAL_COMMUNITY): Payer: No Typology Code available for payment source | Admitting: Physical Therapy

## 2016-11-05 ENCOUNTER — Inpatient Hospital Stay (HOSPITAL_COMMUNITY): Payer: No Typology Code available for payment source | Admitting: Speech Pathology

## 2016-11-05 DIAGNOSIS — I63119 Cerebral infarction due to embolism of unspecified vertebral artery: Secondary | ICD-10-CM

## 2016-11-05 LAB — GLUCOSE, CAPILLARY
GLUCOSE-CAPILLARY: 79 mg/dL (ref 65–99)
Glucose-Capillary: 101 mg/dL — ABNORMAL HIGH (ref 65–99)
Glucose-Capillary: 130 mg/dL — ABNORMAL HIGH (ref 65–99)
Glucose-Capillary: 149 mg/dL — ABNORMAL HIGH (ref 65–99)

## 2016-11-05 LAB — TSH: TSH: 1.061 u[IU]/mL (ref 0.350–4.500)

## 2016-11-05 LAB — T4, FREE: FREE T4: 0.94 ng/dL (ref 0.61–1.12)

## 2016-11-05 MED ORDER — POLYVINYL ALCOHOL 1.4 % OP SOLN
2.0000 [drp] | Freq: Two times a day (BID) | OPHTHALMIC | Status: DC
  Administered 2016-11-05 – 2016-11-25 (×36): 2 [drp] via OPHTHALMIC
  Filled 2016-11-05: qty 15

## 2016-11-05 NOTE — Progress Notes (Signed)
Physical Therapy Session Note  Patient Details  Name: Eric Morton MRN: [id number] Date of Birth: March 12, 1965  Today's Date: 11/05/2016 PT Individual Time: 1000-1030 PT Individual Time Calculation (min): 30 min   Short Term Goals: Week 1:  PT Short Term Goal 1 (Week 1): Pt will transfer with min assist from bed<>w/c PT Short Term Goal 1 - Progress (Week 1): Met PT Short Term Goal 2 (Week 1): Pt will ambulate x 50 ft with mod assist PT Short Term Goal 2 - Progress (Week 1): Progressing toward goal PT Short Term Goal 3 (Week 1): Pt will propel w/c 150 ft with min assist PT Short Term Goal 3 - Progress (Week 1): Met Week 2:     Skilled Therapeutic Interventions/Progress Updates:   Pt received sitting in WC and agreeable to PT WC mobility with L hemi technique x 118f to day room with supervision assist from PT. Min cues for obstacle awareness. .  Kinetron seated reciprocal movement training 4 x 1 minute at 40cm/sec. Attempted standing reciporcal movement, but pt unable to activate the Rglute or quad in standing. Sit<>stand with 30 sec hold x 3 with RLE extended and LLE bent in kinetron. PT required to block the RLE and provide max facilitation at hip for full extension.   [facility] and all needs met.          Therapy Documentation Precautions:  Precautions Precautions: Fall Restrictions Weight Bearing Restrictions: No Pain: Pain Assessment Pain Assessment: No/denies pain Pain Score: 0-No pain  See Function Navigator for Current Functional Status.   Therapy/Group: Individual Therapy  [name]10/10/2016, 11:02 AM

## 2016-11-05 NOTE — Progress Notes (Signed)
Social Work Patient ID: Eric Morton, male   DOB: 07-28-1965, 51 y.o.   MRN: [id number]  Met with pt, and sister and spoke with Shirley-aunt per telephone to discuss team conference goals and care pt will  need at discharge. Both aunt and sister feel he will need to go to a NH until he is more able to take care of himself. Sister here today to assist with Medicaid and disability applications. Will begin the NH process, Since pt reluctantly agreed to this option.

## 2016-11-05 NOTE — Progress Notes (Signed)
Physical Therapy Weekly Progress Note  Patient Details  Name: Eric Morton MRN: [id number] Date of Birth: 03-15-1965  Beginning of progress report period: October 28, 2016 End of progress report period: November 05, 2016  Today's Date: 11/05/2016 PT Individual Time: 0805-0900 PT Individual Time Calculation (min): 55 min   Patient has met 2 of 3 short term goals.  Pt has demonstrated slower than anticipated progress during current week of therapy. He has demonstrated improvement in bed mobility and transfers however continues to demonstrate decreased hip stability, R quad activation to progress gait and dynamic balance.   Patient continues to demonstrate the following deficits muscle weakness and muscle paralysis, impaired timing and sequencing, abnormal tone, unbalanced muscle activation, decreased coordination and decreased motor planning and decreased initiation and decreased safety awareness and therefore will continue to benefit from skilled PT intervention to increase functional independence with mobility.  Patient progressing toward long term goals..  Continue plan of care.  PT Short Term Goals Week 1:  PT Short Term Goal 1 (Week 1): Pt will transfer with min assist from bed<>w/c PT Short Term Goal 1 - Progress (Week 1): Met PT Short Term Goal 2 (Week 1): Pt will ambulate x 50 ft with mod assist PT Short Term Goal 2 - Progress (Week 1): Progressing toward goal PT Short Term Goal 3 (Week 1): Pt will propel w/c 150 ft with min assist PT Short Term Goal 3 - Progress (Week 1): Met Week 2:     Skilled Therapeutic Interventions/Progress Updates: Pt presented in bed agreeable to therapy. Throughout session pt pointing to eyes expressing increased difficulty to keep eyes open and noted increased redness in R eye, nsg notified. Performed supine to sit with use of bed rail minA with cues for placement of LUE to improve sequencing. PTA donned pants for time management and pt performed sit to  stand modA with cues for hand placement to facilitate transfer. Pt required minA to maintain standing balance. Performed squat pivot to R minA. Pt propelled to day room supervision with x 1 rest break. Performed Cybex Kinetron for R NMR 80cm/sec 2 trials x 2 min ea and 1 trial (2 min) at 70cm/sec. Pt transported to rehab gym and performed squat pivot to L minA and scooting L/R on mat for reinforcement. Performed supine RLE NMR including heel slides, hip abd/add, SAQ, and modified bridge with bolster under knees all performed to fatigue. Pt returned to w/c via squat pivot and transported back to room. Pt remained in w/c with needs met to await next therapy session.      Therapy Documentation Precautions:  Precautions Precautions: Fall Restrictions Weight Bearing Restrictions: No General:   Vital Signs: Therapy Vitals Temp: (!) 97.5 F (36.4 C) Temp Source: Oral Pulse Rate: 79 Resp: 18 BP: (!) 168/95 (Rn notified) Patient Position (if appropriate): Lying Oxygen Therapy SpO2: 98 % O2 Device: Not Delivered   See Function Navigator for Current Functional Status.  Therapy/Group: Individual Therapy  Rosita DeChalus  Rosita DeChalus, PTA  11/05/2016, 7:43 AM

## 2016-11-05 NOTE — Progress Notes (Signed)
Subjective/Complaints: Pt with gestures communicates that he has difficulty keeping eyes open  ROS- not able to obtain full review due to communication issues, Objective: Vital Signs: Blood pressure (!) 168/95, pulse 79, temperature (!) 97.5 F (36.4 C), temperature source Oral, resp. rate 18, height 5' 7" (1.702 m), weight 74.8 kg (165 lb), SpO2 98 %. Ct Head Wo Contrast  Result Date: 11/03/2016 CLINICAL DATA:  Stroke, focal neurological diaphysis. EXAM: CT HEAD WITHOUT CONTRAST TECHNIQUE: Contiguous axial images were obtained from the base of the skull through the vertex without intravenous contrast. COMPARISON:  MR brain 10/23/2016 and CT head 10/23/2016. FINDINGS: Brain: An area of progressive low-attenuation in the left basal ganglia corresponds to an acute infarct seen on 10/23/2016. Previously seen areas of micro infarction seen supratentorially, as well as within the brainstem, are poorly resolved today. No evidence of a new infarct. Old left cerebellar infarct. Mild atrophy. Periventricular low attenuation. No evidence of an acute hemorrhage, mass lesion, mass effect or hydrocephalus. Vascular: No hyperdense vessel or unexpected calcification. Skull: Normal. Negative for fracture or focal lesion. Sinuses/Orbits: Scattered opacification of ethmoid air cells. Orbits are grossly unremarkable. Other: None. IMPRESSION: 1. Evolving subacute left basal ganglia infarct. Previously seen areas of micro infarction seen supratentorially, as well as within the brainstem, are poorly resolved today. 2. Mild atrophy and chronic microvascular white matter ischemic changes. Electronically Signed   By: Lorin Picket M.D.   On: 11/03/2016 11:50   Results for orders placed or performed during the hospital encounter of 10/27/16 (from the past 72 hour(s))  Glucose, capillary     Status: Abnormal   Collection Time: 11/02/16 11:50 AM  Result Value Ref Range   Glucose-Capillary 138 (H) 65 - 99 mg/dL  Glucose,  capillary     Status: Abnormal   Collection Time: 11/02/16  5:27 PM  Result Value Ref Range   Glucose-Capillary 115 (H) 65 - 99 mg/dL  Glucose, capillary     Status: Abnormal   Collection Time: 11/02/16  9:47 PM  Result Value Ref Range   Glucose-Capillary 113 (H) 65 - 99 mg/dL   Comment 1 Notify RN   Creatinine, serum     Status: None   Collection Time: 11/03/16  5:58 AM  Result Value Ref Range   Creatinine, Ser 0.96 0.61 - 1.24 mg/dL   GFR calc non Af Amer >60 >60 mL/min   GFR calc Af Amer >60 >60 mL/min    Comment: (NOTE) The eGFR has been calculated using the CKD EPI equation. This calculation has not been validated in all clinical situations. eGFR's persistently <60 mL/min signify possible Chronic Kidney Disease.   Glucose, capillary     Status: Abnormal   Collection Time: 11/03/16  6:38 AM  Result Value Ref Range   Glucose-Capillary 106 (H) 65 - 99 mg/dL   Comment 1 Notify RN   Glucose, capillary     Status: Abnormal   Collection Time: 11/03/16 11:49 AM  Result Value Ref Range   Glucose-Capillary 128 (H) 65 - 99 mg/dL  Glucose, capillary     Status: Abnormal   Collection Time: 11/03/16  5:10 PM  Result Value Ref Range   Glucose-Capillary 119 (H) 65 - 99 mg/dL  Glucose, capillary     Status: Abnormal   Collection Time: 11/03/16  9:20 PM  Result Value Ref Range   Glucose-Capillary 161 (H) 65 - 99 mg/dL  Glucose, capillary     Status: Abnormal   Collection Time: 11/04/16  6:48 AM  Result Value Ref Range   Glucose-Capillary 104 (H) 65 - 99 mg/dL  Glucose, capillary     Status: Abnormal   Collection Time: 11/04/16 12:07 PM  Result Value Ref Range   Glucose-Capillary 174 (H) 65 - 99 mg/dL  Glucose, capillary     Status: Abnormal   Collection Time: 11/04/16  4:38 PM  Result Value Ref Range   Glucose-Capillary 162 (H) 65 - 99 mg/dL  Glucose, capillary     Status: Abnormal   Collection Time: 11/04/16  9:23 PM  Result Value Ref Range   Glucose-Capillary 103 (H) 65 - 99  mg/dL   Comment 1 Notify RN   Glucose, capillary     Status: Abnormal   Collection Time: 11/05/16  6:38 AM  Result Value Ref Range   Glucose-Capillary 101 (H) 65 - 99 mg/dL   Comment 1 Notify RN      HEENT:poor dentition, exopthalmos, no ptosis EOMI Cardio: RRR and no murmur Resp: CTA B/L and unlabored GI: BS positive and NT, ND Extremity:  No Edema Skin:   Intact Neuro: Abnormal Sensory difficult to assess due to dysarthria, Abnormal Motor 0/5 RUE trace elbow flex, 2- Left knee extensor, Dysarthric and no emotional lability- Musc/Skel:  Other mild tenderness to palpation over lumbar paraspinals Gen NAD   Assessment/Plan: 1. Functional deficits secondary to Right hemiparesis , dysarthria, dysphagia which require 3+ hours per day of interdisciplinary therapy in a comprehensive inpatient rehab setting. Physiatrist is providing close team supervision and 24 hour management of active medical problems listed below. Physiatrist and rehab team continue to assess barriers to discharge/monitor patient progress toward functional and medical goals. FIM: Function - Bathing Bathing activity did not occur: Refused Position: Shower Body parts bathed by patient: Right arm, Chest, Abdomen, Front perineal area, Left lower leg, Right upper leg, Left upper leg, Buttocks, Right lower leg Body parts bathed by helper: Back, Left arm Assist Level: Touching or steadying assistance(Pt > 75%)  Function- Upper Body Dressing/Undressing What is the patient wearing?: Pull over shirt/dress Pull over shirt/dress - Perfomed by patient: Thread/unthread right sleeve, Put head through opening, Thread/unthread left sleeve Pull over shirt/dress - Perfomed by helper: Pull shirt over trunk Assist Level: Touching or steadying assistance(Pt > 75%) Function - Lower Body Dressing/Undressing What is the patient wearing?: Underwear, Pants, Non-skid slipper socks Position: Wheelchair/chair at sink Underwear - Performed by  patient: Thread/unthread right underwear leg, Thread/unthread left underwear leg, Pull underwear up/down Underwear - Performed by helper: Pull underwear up/down Pants- Performed by patient: Thread/unthread left pants leg, Thread/unthread right pants leg, Pull pants up/down Pants- Performed by helper: Thread/unthread right pants leg, Pull pants up/down Non-skid slipper socks- Performed by patient: Don/doff right sock, Don/doff left sock Non-skid slipper socks- Performed by helper: Don/doff left sock, Don/doff right sock Assist for footwear: Partial/moderate assist Assist for lower body dressing: Touching or steadying assistance (Pt > 75%)  Function - Toileting Toileting steps completed by patient: Adjust clothing prior to toileting, Performs perineal hygiene, Adjust clothing after toileting Toileting steps completed by helper: Adjust clothing prior to toileting, Performs perineal hygiene, Adjust clothing after toileting Toileting Assistive Devices: Grab bar or rail Assist level: Touching or steadying assistance (Pt.75%)  Function - Air cabin crew transfer assistive device: Grab bar Assist level to toilet: Maximal assist (Pt 25 - 49%/lift and lower) Assist level from toilet: Touching or steadying assistance (Pt > 75%)  Function - Chair/bed transfer Chair/bed transfer method: Stand pivot Chair/bed transfer assist level: Moderate assist (Pt 50 -  74%/lift or lower) Chair/bed transfer assistive device: Armrests Chair/bed transfer details: Tactile cues for sequencing, Verbal cues for sequencing, Verbal cues for technique, Verbal cues for precautions/safety, Tactile cues for weight shifting  Function - Locomotion: Wheelchair Will patient use wheelchair at discharge?: Yes Type: Manual Max wheelchair distance: 236f Assist Level: Supervision or verbal cues Assist Level: Supervision or verbal cues Wheel 150 feet activity did not occur: Safety/medical concerns Assist Level: Supervision  or verbal cues Turns around,maneuvers to table,bed, and toilet,negotiates 3% grade,maneuvers on rugs and over doorsills: No Function - Locomotion: Ambulation Assistive device: Rail in hallway Max distance: 314fAssist level: Moderate assist (Pt 50 - 74%) Assist level: 2 helpers (+2 for w/c follow; mod A otherwise) Walk 50 feet with 2 turns activity did not occur: Safety/medical concerns Walk 150 feet activity did not occur: Safety/medical concerns Walk 10 feet on uneven surfaces activity did not occur: Safety/medical concerns  Function - Comprehension Comprehension: Auditory Comprehension assist level: Follows basic conversation/direction with no assist  Function - Expression Expression: Verbal Expression assistive device: Communication board Expression assist level: Expresses basic 50 - 74% of the time/requires cueing 25 - 49% of the time. Needs to repeat parts of sentences.  Function - Social Interaction Social Interaction assist level: Interacts appropriately 90% of the time - Needs monitoring or encouragement for participation or interaction.  Function - Problem Solving Problem solving assist level: Solves basic 90% of the time/requires cueing < 10% of the time  Function - Memory Memory assist level: Recognizes or recalls 90% of the time/requires cueing < 10% of the time Patient normally able to recall (first 3 days only): Staff names and faces, That he or she is in a hospital   Medical Problem List and Plan: 1.  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 Formulized individual treatment plan based on medical history, underlying problem and comorbidities.2.  DVT  Prophylaxis/Anticoagulation: Subcutaneous Lovenox. Venous Doppler studies negative, PLT 137K monitor 3. Pain Management: Neurontin 300 mg daily at bedtime 4. Mood: Ego support 5. Neuropsych: This patient is capable of making decisions on his own behalf. 6. Skin/Wound Care:  Routine skin checks 7. Fluids/Electrolytes/Nutrition:  Routine I&O with follow-up chemistries 8. Dysphagia. Dysphagia #1 honey liquids. Monitor hydration. Follow-up speech therapy 9.Hypertension. Lisinopril 1050maily increased on 10/6 - elevated systolic 10/27/0y need to add second agent Vitals:   11/04/16 1500 11/05/16 0500  BP: (!) 153/91 (!) 168/95  Pulse: 76 79  Resp: 18 18  Temp: 97.6 F (36.4 C) (!) 97.5 F (36.4 C)  SpO2: 100% 98%   10. Diabetes mellitus peripheral neuropathy. Hemoglobin A1c 8.9. SSI.Check blood sugars before meals and at bedtime. Patient on Glucotrol 5 mg twice a day, Glucophage 1000 mg twice a day. Resume as needed CBG (last 3)   Recent Labs  11/04/16 1638 11/04/16 2123 11/05/16 0638  GLUCAP 162* 103* 101*  Controlled 10/9 11.Polysubstance abuse. UDS positive for cocaine. Provide counseling with Neuropsych 12.History polycythemia. Follow-up labs 13. ?PBA: Monitor.  Will consider Neuropsych, improved , on sertraline 14.  Buccal surface laceration likely related to dyscoordinated oral movements, left side over salivary duct will schedule MMW 15.  Hypoalbuminemia- start prostat 16.  Pt c/o diffulty holding eyes open , no ptosis or EOM  weakness, severe dysarthria and R hemiparesis obscures neuro exam, check ACH-R antibody 17.  Exopthalmos- normal TSH in 2016 , no T4 results, will check thyroid studies, has had little medical care on outpt basis LOS (Days) 9 A FACE TO FACE EVALUATION WAS PERFORMED  KIRSTEINS,ANDREW E 11/05/2016, 9:15 AM

## 2016-11-05 NOTE — Progress Notes (Signed)
Speech Language Pathology Weekly Progress and Session Note  Patient Details  Name: Eric Morton MRN: [id number] Date of Birth: 01/28/1966  Beginning of progress report period:  October 28, 2016  End of progress report period: November 05, 2016   Today's Date: 11/05/2016 SLP Individual Time: 6004-5997 SLP Individual Time Calculation (min): 45 min  Short Term Goals: Week 1: SLP Short Term Goal 1 (Week 1): Pt will consume current diet with Min s/s asiration with Mod A verbal cues for use of swallowing compensatory strategies. SLP Short Term Goal 1 - Progress (Week 1): Met SLP Short Term Goal 2 (Week 1): Pt will tolerate trials of nectar thick liquids with use of compensatory strategies with Min s/s aspiration over 3 observed sessions to demonstrate readiness for diet advancement. SLP Short Term Goal 2 - Progress (Week 1): Progressing toward goal SLP Short Term Goal 3 (Week 1): Pt will tolerate trials of Dys 2 with use of compensatory strategies with Min s/s aspiration and oral residue over 3 observed sessions to demonstrate readiness for diet advancement. SLP Short Term Goal 3 - Progress (Week 1): Progressing toward goal SLP Short Term Goal 4 (Week 1): Pt will demonstrate effective use of communication board to express wants/needs at word level with Min A multimodal cues.  SLP Short Term Goal 4 - Progress (Week 1): Met SLP Short Term Goal 5 (Week 1): Pt will demonstrate functional problem solving for basic and familiar tasks with Mod A verbal cues. SLP Short Term Goal 5 - Progress (Week 1): Met SLP Short Term Goal 6 (Week 1): Pt will utilize speech intelligibility strategies at word level with Max A verbal cues to acheive 50% intelligibility.  SLP Short Term Goal 6 - Progress (Week 1): Progressing toward goal    New Short Term Goals: Week 2: SLP Short Term Goal 1 (Week 2): Pt will consume current diet with Min s/s asiration with supervision verbal cues for use of swallowing compensatory  strategies. SLP Short Term Goal 2 (Week 2): Pt will tolerate trials of nectar thick liquids with use of compensatory strategies with Min s/s aspiration over 3 observed sessions to demonstrate readiness for diet advancement. SLP Short Term Goal 3 (Week 2): Pt will tolerate trials of Dys 2 with use of compensatory strategies with Min s/s aspiration and oral residue over 3 observed sessions to demonstrate readiness for diet advancement. SLP Short Term Goal 4 (Week 2): Pt will demonstrate effective use of communication board to express wants/needs at word level with mod I  SLP Short Term Goal 5 (Week 2): Pt will demonstrate functional problem solving for basic and familiar tasks with Supervision verbal cues. SLP Short Term Goal 6 (Week 2): Pt will utilize speech intelligibility strategies at word level with Max A verbal cues to acheive 50% intelligibility.   Weekly Progress Updates:  Pt has made functional gains this reporting period and has met 3 out of 6 short term goals.  Pt is consuming a dys 1 diet with honey thick liquids with min-supervision cues for use of swallowing precautions and minimal overt s/s of aspiration; however, he does continue to demonstrate decreased management of secretions with wet vocal quality outside of PO intake and pt is a known silent aspirator, so diet advancement has been conservatively slow to minimize risk of developing an infectious process.  Will continue working towards repeat objective study within the next reporting period.  Pt currently needs max assist for speech intelligibility when verbally expressing his needs and wants at  the word level but is becoming increasingly independent with his use of a communication board.  As a result, pt would continue to benefit from skilled ST while inpatient in order to maximize functional independence and reduce burden of care prior to discharge.  Anticipate that pt will need 24/7 supervision at discharge in addition to [place] follow up at  next level of care.       Intensity: Minumum of 1-2 x/day, 30 to 90 minutes Frequency: 3 to 5 out of 7 days Duration/Length of Stay: 2.5-3 weeks Treatment/Interventions: Cognitive remediation/compensation;Cueing hierarchy;Functional tasks;Dysphagia/aspiration precaution training;Patient/family education;Speech/Language facilitation   Daily Session  Skilled Therapeutic Interventions: Pt was seen for skilled ST targeting goals for communication and dysphagia.  Pt was more lethargic today in comparison to previous therapy sessions.  RN reporting that pt did not sleep well last night and is pending some lab work to monitor thyroid function.  VS WFL and pt indicated wanting to proceed with participating in therapy.  Given fatigue, pt needed mod-max assist verbal cues to communicate using his board.  He consumed therapeutic trials of dys 2 textures with increased time needed for transiting boluses but he was able to clear residual solids from the oral cavity with supervision cues.   No overt s/s of aspiration were evident with solids or liquids.  Pt was returned to room and left in wheelchair with call bell within reach.  Goals updated on this date to reflect current progress in therapies.        Function:   Eating Eating   Modified Consistency Diet: Yes Eating Assist Level: Supervision or verbal cues;Set up assist for   Eating Set Up Assist For: Opening containers       Cognition Comprehension Comprehension assist level: Follows basic conversation/direction with no assist  Expression Expression assistive device: Communication board Expression assist level: Expresses basic 25 - 49% of the time/requires cueing 50 - 75% of the time. Uses single words/gestures.  Social Interaction Social Interaction assist level: Interacts appropriately 90% of the time - Needs monitoring or encouragement for participation or interaction.  Problem Solving Problem solving assist level: Solves basic 90% of the  time/requires cueing < 10% of the time  Memory Memory assist level: Recognizes or recalls 90% of the time/requires cueing < 10% of the time   General    Pain Pain Assessment Pain Assessment: No/denies pain Pain Score: 0-No pain  Therapy/Group: Individual Therapy  Malikiah Debarr, Selinda Orion 11/05/2016, 12:09 PM

## 2016-11-05 NOTE — Progress Notes (Signed)
Occupational Therapy Session Note  Patient Details  Name: Eric Morton MRN: [id number] Date of Birth: 03-17-1965  Today's Date: 11/05/2016 OT Individual Time: 0900-0930 OT Individual Time Calculation (min): 30 min    Skilled Therapeutic Interventions/Progress Updates:    1:1 Focus on self feeding in the dayroom and AAROM for left UE. Pt able to self feed grits but did require A to spoon liquid to self.  Pt with more clear articulation at word level today. Left sitting up in the w/c in room.   Therapy Documentation Precautions:  Precautions Precautions: Fall Restrictions Weight Bearing Restrictions: No Pain: Pain Assessment Pain Assessment: No/denies pain Pain Score: 0-No pain  See Function Navigator for Current Functional Status.   Therapy/Group: Individual Therapy  Eric Morton [facility] 11/05/2016, 9:50 AM

## 2016-11-05 NOTE — Progress Notes (Signed)
Occupational Therapy Session Note  Patient Details  Name: Eric Morton MRN: [id number] Date of Birth: 09-20-1965  Today's Date: 11/05/2016 OT Individual Time: 1300-1330 OT Individual Time Calculation (min): 30 min    Short Term Goals: Week 2:  OT Short Term Goal 1 (Week 2): Pt will complete 1/3 toileting tasks with min steadying assist OT Short Term Goal 2 (Week 2): Pt will complete 1 grooming task in standing with steadying assist to increase functional standing tolerance/balance OT Short Term Goal 3 (Week 2): Pt will consistently complete toilet transfers with min A  Skilled Therapeutic Interventions/Progress Updates:    Pt seen for OT session focusing on neuro re-ed and use of R UE. Pt sitting up in recliner upon arrival, gestures made known of discomfort with B eyes, at times pt having to manually hold eye lids up, RN aware. With encouragement, pt agreeable to tx session. He completed stand/squat pivot transfers throughout session with overall min A with VCs for hand placement and technique. Seated on EOM, worked on elbow extension with support at elbow and wrist. With increased effort pt able to reach out towards cup. He moves in synergistic movement patterns to return UE towards mouth. With assist, to place hand around cup, pt able to maintain grasp on cup and complete flexion/extension movements with same assist as described above. Pt returned to room at end of session, returned to recliner and left with all needs in reach.   Therapy Documentation Precautions:  Precautions Precautions: Fall Restrictions Weight Bearing Restrictions: No Pain:   No/ denies pain  See Function Navigator for Current Functional Status.   Therapy/Group: Individual Therapy  Lewis, Shaneka Efaw C 11/05/2016, 7:22 AM

## 2016-11-06 ENCOUNTER — Inpatient Hospital Stay (HOSPITAL_COMMUNITY): Payer: No Typology Code available for payment source | Admitting: Physical Therapy

## 2016-11-06 ENCOUNTER — Telehealth: Payer: Self-pay | Admitting: Neurology

## 2016-11-06 ENCOUNTER — Inpatient Hospital Stay (HOSPITAL_COMMUNITY): Payer: No Typology Code available for payment source | Admitting: Occupational Therapy

## 2016-11-06 ENCOUNTER — Inpatient Hospital Stay (HOSPITAL_COMMUNITY): Payer: No Typology Code available for payment source | Admitting: Speech Pathology

## 2016-11-06 ENCOUNTER — Inpatient Hospital Stay (HOSPITAL_COMMUNITY): Payer: No Typology Code available for payment source

## 2016-11-06 LAB — GLUCOSE, CAPILLARY
GLUCOSE-CAPILLARY: 127 mg/dL — AB (ref 65–99)
GLUCOSE-CAPILLARY: 202 mg/dL — AB (ref 65–99)
GLUCOSE-CAPILLARY: 191 mg/dL — AB (ref 65–99)
Glucose-Capillary: 78 mg/dL (ref 65–99)

## 2016-11-06 MED ORDER — MAGIC MOUTHWASH W/LIDOCAINE: 5.0000 mL | Freq: Three times a day (TID) | ORAL | Status: DC | PRN

## 2016-11-06 MED ORDER — LISINOPRIL 20 MG PO TABS
20.0000 mg | ORAL_TABLET | Freq: Every day | ORAL | Status: DC
  Administered 2016-11-07 – 2016-11-29 (×23): 20 mg via ORAL
  Filled 2016-11-06 (×23): qty 1

## 2016-11-06 NOTE — Progress Notes (Signed)
Occupational Therapy Session Note  Patient Details  Name: Eric Morton MRN: [id number] Date of Birth: December 20, 1965  Today's Date: 11/06/2016 OT Individual Time: 6546-5035 OT Individual Time Calculation (min): 60 min    Short Term Goals: Week 2:  OT Short Term Goal 1 (Week 2): Pt will complete 1/3 toileting tasks with min steadying assist OT Short Term Goal 2 (Week 2): Pt will complete 1 grooming task in standing with steadying assist to increase functional standing tolerance/balance OT Short Term Goal 3 (Week 2): Pt will consistently complete toilet transfers with min A  Skilled Therapeutic Interventions/Progress Updates:    Pt seen for OT treatment session focusing on R NMRE and therapeutic standing tolerance to improve functional independence and safety during BADLs.  Pt seated in w/c upon OT arrival and was agreeable to participating.  Pt completed multiple standing trials at standing frame (no harness) with Min-Mod A to transition to standing and VCing for hand placement.  Pt tolerated standing x 3.5 mins, 5.5 mins, and 6 mins with mod-max A to maintain R knee control throughout standing tasks.  Pt participated in [place] on table top while standing, pt able to complete flexor synergy patterns without A but required Mod-Max A for horiz abduction, shoulder extension, and elbow extension.  Mod A to facilitate grasp on cup.  Following standing, pt participated in seated RUE AAROM at tabletop while stacking cones with max A to facilitate.  Pt assisted back to room and remained seated in w/c with all needs in reach upon OT departure.  Therapy Documentation Precautions:  Precautions Precautions: Fall Restrictions Weight Bearing Restrictions: No Pain: Pain Assessment Pain Assessment: No/denies pain  See Function Navigator for Current Functional Status.   Therapy/Group: Individual Therapy  Marcella Dubs 11/06/2016, 12:48 PM

## 2016-11-06 NOTE — Progress Notes (Signed)
Speech Language Pathology Daily Session Note  Patient Details  Name: Eric Morton MRN: [id number] Date of Birth: 1965/10/19  Today's Date: 11/06/2016 SLP Individual Time: 0850-0930 SLP Individual Time Calculation (min): 40 min  Short Term Goals: Week 2: SLP Short Term Goal 1 (Week 2): Pt will consume current diet with Min s/s asiration with supervision verbal cues for use of swallowing compensatory strategies. SLP Short Term Goal 2 (Week 2): Pt will tolerate trials of nectar thick liquids with use of compensatory strategies with Min s/s aspiration over 3 observed sessions to demonstrate readiness for diet advancement. SLP Short Term Goal 3 (Week 2): Pt will tolerate trials of Dys 2 with use of compensatory strategies with Min s/s aspiration and oral residue over 3 observed sessions to demonstrate readiness for diet advancement. SLP Short Term Goal 4 (Week 2): Pt will demonstrate effective use of communication board to express wants/needs at word level with mod I  SLP Short Term Goal 5 (Week 2): Pt will demonstrate functional problem solving for basic and familiar tasks with Supervision verbal cues. SLP Short Term Goal 6 (Week 2): Pt will utilize speech intelligibility strategies at word level with Max A verbal cues to acheive 50% intelligibility.   Skilled Therapeutic Interventions:  Pt was seen for skilled ST targeting dysphagia and communication goals.  Pt was much more awake and alert this morning in comparison to yesterday's therapy session.  Pt was able to utilize communication board to convey needs and wants as well as abstract ideas to therapist with supervision question cues.  Pt consumed dys 1 textures and honey thick liquids with supervision cues for use of swallowing precautions and no overt s/s of aspiration with purees or thickened liquids.  Pt was left in wheelchair at the end of today's therapy session with call bell within reach.  Continue per current plan of care.        Function:  Eating Eating   Modified Consistency Diet: Yes Eating Assist Level: Supervision or verbal cues;Set up assist for   Eating Set Up Assist For: Opening containers       Cognition Comprehension Comprehension assist level: Follows basic conversation/direction with no assist  Expression Expression assistive device: Communication board Expression assist level: Expresses basic 25 - 49% of the time/requires cueing 50 - 75% of the time. Uses single words/gestures.  Social Interaction Social Interaction assist level: Interacts appropriately 90% of the time - Needs monitoring or encouragement for participation or interaction.  Problem Solving Problem solving assist level: Solves basic 90% of the time/requires cueing < 10% of the time  Memory Memory assist level: Recognizes or recalls 90% of the time/requires cueing < 10% of the time    Pain Pain Assessment Pain Assessment: No/denies pain Pain Score: 0-No pain  Therapy/Group: Individual Therapy  Sokhna Christoph, Selinda Orion 11/06/2016, 9:34 AM

## 2016-11-06 NOTE — Telephone Encounter (Signed)
Hi, Betty:  Thanks for the message. Please refer him to our [facility] colleagues. Do I need to put a new referral or you can do it for me? Thanks   Rosalin Hawking, MD PhD Stroke Neurology 11/06/2016 1:32 PM

## 2016-11-06 NOTE — Progress Notes (Signed)
Physical Therapy Note  Patient Details  Name: MICKELL BIRDWELL MRN: [id number] Date of Birth: 09-Oct-1965 Today's Date: 11/06/2016  1535-1610, 35 min individual tx Pain: none  tx focused on neuro re-ed RLE via forced use, multimodal cues; RAFO doffed.   Seated in w/c at [facility],, pt used RLE only, with other foot plate resisted by PT. Resistance set at 50, and pt leaning forward to hold onto hand grip during movement.  Pt demonstrated RLE mass extension x 10 cycles x 5, with pt vocalizing with each push to prevent Valsalva. Pt reported he felt pushing in his R thigh.  Sit> stand at high sturdy table, to work on R knee awareness/ stance stability during LUE task at sturdy table, manipulating pegs into peg board, x 10 minutes. Pt needed mod> max assist for R knee extension in standing.   Pt left resting in w/c with all needs within reach.  See function navigator for current status.  Ainslee Sou 11/06/2016, 12:42 PM

## 2016-11-06 NOTE — Progress Notes (Signed)
Subjective/Complaints:  Points to eyes , thumbs up  ROS- not able to obtain full review due to communication issues, Objective: Vital Signs: Blood pressure (!) 150/88, pulse 76, temperature 97.9 F (36.6 C), temperature source Oral, resp. rate 18, height 5\' 7"  (1.702 m), weight 74.8 kg (165 lb), SpO2 100 %. No results found. Results for orders placed or performed during the hospital encounter of 10/27/16 (from the past 72 hour(s))  Glucose, capillary     Status: Abnormal   Collection Time: 11/03/16 11:49 AM  Result Value Ref Range   Glucose-Capillary 128 (H) 65 - 99 mg/dL  Glucose, capillary     Status: Abnormal   Collection Time: 11/03/16  5:10 PM  Result Value Ref Range   Glucose-Capillary 119 (H) 65 - 99 mg/dL  Glucose, capillary     Status: Abnormal   Collection Time: 11/03/16  9:20 PM  Result Value Ref Range   Glucose-Capillary 161 (H) 65 - 99 mg/dL  Glucose, capillary     Status: Abnormal   Collection Time: 11/04/16  6:48 AM  Result Value Ref Range   Glucose-Capillary 104 (H) 65 - 99 mg/dL  Glucose, capillary     Status: Abnormal   Collection Time: 11/04/16 12:07 PM  Result Value Ref Range   Glucose-Capillary 174 (H) 65 - 99 mg/dL  Glucose, capillary     Status: Abnormal   Collection Time: 11/04/16  4:38 PM  Result Value Ref Range   Glucose-Capillary 162 (H) 65 - 99 mg/dL  Glucose, capillary     Status: Abnormal   Collection Time: 11/04/16  9:23 PM  Result Value Ref Range   Glucose-Capillary 103 (H) 65 - 99 mg/dL   Comment 1 Notify RN   Glucose, capillary     Status: Abnormal   Collection Time: 11/05/16  6:38 AM  Result Value Ref Range   Glucose-Capillary 101 (H) 65 - 99 mg/dL   Comment 1 Notify RN   TSH     Status: None   Collection Time: 11/05/16 10:43 AM  Result Value Ref Range   TSH 1.061 0.350 - 4.500 uIU/mL    Comment: Performed by a 3rd Generation assay with a functional sensitivity of <=0.01 uIU/mL.  T4, free     Status: None   Collection Time:  11/05/16 10:43 AM  Result Value Ref Range   Free T4 0.94 0.61 - 1.12 ng/dL    Comment: (NOTE) Biotin ingestion may interfere with free T4 tests. If the results are inconsistent with the TSH level, previous test results, or the clinical presentation, then consider biotin interference. If needed, order repeat testing after stopping biotin.   Glucose, capillary     Status: Abnormal   Collection Time: 11/05/16 11:55 AM  Result Value Ref Range   Glucose-Capillary 130 (H) 65 - 99 mg/dL  Glucose, capillary     Status: None   Collection Time: 11/05/16  4:47 PM  Result Value Ref Range   Glucose-Capillary 79 65 - 99 mg/dL  Glucose, capillary     Status: Abnormal   Collection Time: 11/05/16  9:10 PM  Result Value Ref Range   Glucose-Capillary 149 (H) 65 - 99 mg/dL   Comment 1 Notify RN   Glucose, capillary     Status: None   Collection Time: 11/06/16  6:22 AM  Result Value Ref Range   Glucose-Capillary 78 65 - 99 mg/dL   Comment 1 Notify RN      HEENT:poor dentition, exopthalmos, no ptosis EOMI Cardio: RRR and no  murmur Resp: CTA B/L and unlabored GI: BS positive and NT, ND Extremity:  No Edema Skin:   Intact Neuro: Abnormal Sensory difficult to assess due to dysarthria, Abnormal Motor 0/5 RUE trace elbow flex, 2- Left knee extensor, Dysarthric and no emotional lability- Musc/Skel:  Other mild tenderness to palpation over lumbar paraspinals Gen NAD   Assessment/Plan: 1. Functional deficits secondary to Right hemiparesis , dysarthria, dysphagia which require 3+ hours per day of interdisciplinary therapy in a comprehensive inpatient rehab setting. Physiatrist is providing close team supervision and 24 hour management of active medical problems listed below. Physiatrist and rehab team continue to assess barriers to discharge/monitor patient progress toward functional and medical goals. FIM: Function - Bathing Bathing activity did not occur: Refused Position: Shower Body parts bathed  by patient: Right arm, Chest, Abdomen, Front perineal area, Left lower leg, Right upper leg, Left upper leg, Buttocks, Right lower leg Body parts bathed by helper: Back, Left arm Assist Level: Touching or steadying assistance(Pt > 75%)  Function- Upper Body Dressing/Undressing What is the patient wearing?: Pull over shirt/dress Pull over shirt/dress - Perfomed by patient: Thread/unthread right sleeve, Put head through opening, Thread/unthread left sleeve Pull over shirt/dress - Perfomed by helper: Pull shirt over trunk Assist Level: Touching or steadying assistance(Pt > 75%) Function - Lower Body Dressing/Undressing What is the patient wearing?: Underwear, Pants, Non-skid slipper socks Position: Wheelchair/chair at sink Underwear - Performed by patient: Thread/unthread right underwear leg, Thread/unthread left underwear leg, Pull underwear up/down Underwear - Performed by helper: Pull underwear up/down Pants- Performed by patient: Thread/unthread left pants leg, Thread/unthread right pants leg, Pull pants up/down Pants- Performed by helper: Thread/unthread right pants leg, Pull pants up/down Non-skid slipper socks- Performed by patient: Don/doff right sock, Don/doff left sock Non-skid slipper socks- Performed by helper: Don/doff left sock, Don/doff right sock Assist for footwear: Partial/moderate assist Assist for lower body dressing: Touching or steadying assistance (Pt > 75%)  Function - Toileting Toileting steps completed by patient: Adjust clothing prior to toileting, Performs perineal hygiene, Adjust clothing after toileting Toileting steps completed by helper: Adjust clothing prior to toileting, Adjust clothing after toileting, Performs perineal hygiene Toileting Assistive Devices: Grab bar or rail Assist level: Touching or steadying assistance (Pt.75%)  Function - Air cabin crew transfer assistive device: Grab bar Assist level to toilet: Maximal assist (Pt 25 - 49%/lift  and lower) Assist level from toilet: Touching or steadying assistance (Pt > 75%)  Function - Chair/bed transfer Chair/bed transfer method: Squat pivot Chair/bed transfer assist level: Moderate assist (Pt 50 - 74%/lift or lower) Chair/bed transfer assistive device: Armrests Chair/bed transfer details: Tactile cues for sequencing, Verbal cues for sequencing, Verbal cues for technique, Verbal cues for precautions/safety, Tactile cues for weight shifting  Function - Locomotion: Wheelchair Will patient use wheelchair at discharge?: Yes Type: Manual Max wheelchair distance: 166ft  Assist Level: Supervision or verbal cues Assist Level: Supervision or verbal cues Wheel 150 feet activity did not occur: Safety/medical concerns Assist Level: Supervision or verbal cues Turns around,maneuvers to table,bed, and toilet,negotiates 3% grade,maneuvers on rugs and over doorsills: No Function - Locomotion: Ambulation Assistive device: Rail in hallway Max distance: 31ft Assist level: Moderate assist (Pt 50 - 74%) Assist level: 2 helpers (+2 for w/c follow; mod A otherwise) Walk 50 feet with 2 turns activity did not occur: Safety/medical concerns Walk 150 feet activity did not occur: Safety/medical concerns Walk 10 feet on uneven surfaces activity did not occur: Safety/medical concerns  Function - Comprehension Comprehension: Auditory Comprehension  assist level: Follows basic conversation/direction with no assist  Function - Expression Expression: Verbal Expression assistive device: Communication board Expression assist level: Expresses basic 25 - 49% of the time/requires cueing 50 - 75% of the time. Uses single words/gestures.  Function - Social Interaction Social Interaction assist level: Interacts appropriately 90% of the time - Needs monitoring or encouragement for participation or interaction.  Function - Problem Solving Problem solving assist level: Solves basic 90% of the time/requires  cueing < 10% of the time  Function - Memory Memory assist level: Recognizes or recalls 90% of the time/requires cueing < 10% of the time Patient normally able to recall (first 3 days only): Staff names and faces, That he or she is in a hospital   Medical Problem List and Plan: 1.  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  DVT Prophylaxis/Anticoagulation: Subcutaneous Lovenox. Venous Doppler studies negative, PLT 137K monitor 3. Pain Management: Neurontin 300 mg daily at bedtime 4. Mood: Ego support 5. Neuropsych: This patient is capable of making decisions on his own behalf. 6. Skin/Wound Care:  Routine skin checks 7. Fluids/Electrolytes/Nutrition:  Routine I&O with follow-up chemistries 8. Dysphagia. Dysphagia #1 honey liquids. Monitor hydration. Follow-up speech therapy 9.Hypertension. Lisinopril 10mg  daily increased on 10/6 - elevated systolic 52/84 Increase to 20mg  Vitals:   11/05/16 1526 11/06/16 0500  BP: (!) 145/88 (!) 150/88  Pulse:  76  Resp:  18  Temp:  97.9 F (36.6 C)  SpO2:  100%   10. Diabetes mellitus peripheral neuropathy. Hemoglobin A1c 8.9. SSI.Check blood sugars before meals and at bedtime. Patient on Glucotrol 5 mg twice a day, Glucophage 1000 mg twice a day. Resume as needed CBG (last 3)   Recent Labs  11/05/16 1647 11/05/16 2110 11/06/16 0622  GLUCAP 79 149* 78  Controlled 10/11- no current diabetic meds 11.Polysubstance abuse. UDS positive for cocaine. Provide counseling with Neuropsych 12.History polycythemia. Follow-up labs 13. ?PBA: Monitor.  Will consider Neuropsych, improved , on sertraline 14.  Buccal surface laceration likely related to dyscoordinated oral movements, left side over salivary duct will schedule MMW 15.  Hypoalbuminemia- start prostat 16.  Pt c/o diffulty holding  eyes open , no ptosis or EOM weakness, severe dysarthria and R hemiparesis obscures neuro exam, check ACH-R antibody 17.  Exopthalmos- normal TSH  T4 results, LOS (Days) 10 A FACE TO FACE EVALUATION WAS PERFORMED  Rune Mendez E 11/06/2016, 8:10 AM

## 2016-11-06 NOTE — Patient Care Conference (Signed)
Inpatient RehabilitationTeam Conference and Plan of Care Update Date: 11/05/2016   Time: 10:45 AM    Patient Name: Eric Morton      Medical Record Number: [id number]  Date of Birth: 11/22/1965 Sex: Male         Room/Bed: 4W24C/4W24C-01 Payor Info: Payor: MEDICAID POTENTIAL / Plan: MEDICAID POTENTIAL / Product Type: *No Product type* /    Admitting Diagnosis: L CVA  Admit Date/Time:  10/27/2016  3:38 PM Admission Comments: No comment available   Primary Diagnosis:  Hemiparesis affecting right side as late effect of stroke (HCC) Principal Problem: Hemiparesis affecting right side as late effect of stroke [facility])  Patient Active Problem List   Diagnosis Date Noted  . Hemiparesis affecting right side as late effect of stroke ([place]) 10/29/2016  . Embolic cerebral infarction ([place]) 10/27/2016  . Alcohol use   . Cocaine use   . Dysarthria due to recent stroke   . Dysphagia due to recent stroke   . Left-sided weakness 10/23/2016  . Cerebral thrombosis with cerebral infarction 10/23/2016  . Aphasia   . Hyperlipidemia 07/27/2015  . Cerebrovascular accident (CVA) due to occlusion of cerebral artery ([place])   . Stroke ([place]) 03/21/2015  . Polycythemia vera ([place]) 03/21/2015  . Hx of adenomatous colonic polyps   . Dehydration   . Diarrhea   . Protein-calorie malnutrition ([place])   . Cellulitis of breast of male 09/25/2011  . HTN (hypertension) 09/25/2011  . DM (diabetes mellitus) ([place]) 09/25/2011  . Leukopenia 09/25/2011  . Alcohol abuse 09/25/2011  . Cocaine abuse ([place]) 09/25/2011  . Tobacco use 09/25/2011  . Neuropathy 09/25/2011    Expected Discharge Date: Expected Discharge Date:  (SNF)  Team Members Present: Physician leading conference: Dr. Alysia Penna Social Worker Present: Ovidio Kin, LCSW Nurse Present: Rayetta Pigg, RN PT Present: Barrie Folk, PT;Rosita Dechalus, PTA OT Present: Napoleon Form, OT SLP Present: Stormy Fabian, SLP PPS Coordinator present : Daiva Nakayama,  RN, CRRN     Current Status/Progress Goal Weekly Team Focus  Medical   dysarthria, poor swallow, difficulty with fluid intake  reduce asp risk, Increase fluid intake  W/U of eye c/o   Bowel/Bladder   Continue of bowel and bladder.  Maintain continence.  Provide for toileting needs PRN.   Swallow/Nutrition/ Hydration   Dys 1, honey thick liquids via spoon, min-mod assist   min assist   trials of advanced textures and use of swallow strategies    ADL's   Mod A functional transfers; Max A functional standing balance, Min A UB bathing/dressing; min-mod A LB bathing/dressing  Downgraded to min A overall  Functional standing balance; functional transfers; ADL re-training   Mobility   Mod A transfers. Max assist gait with HW, mod-max assist at rail in hall. Superivsion assist WC mobility with Hemi technique.   Min-mod assist with gait for short distances. supervision-min assist transfers with LRAD.   increased indepence of transfers, improved Neuromotor control in the RLE and RUE. safety awareness.    Communication   max assist for verbal expression, supervision for use of communication board   min assist   continue to address speech intelligibility at the CV syllable level    Safety/Cognition/ Behavioral Observations  min assist for basic   supervision   safety awareness   Pain   Denies pain.  <3  Assess for pain q shift and PRN.   Skin   No skin issues.  Maintain skin integrity.  Assess skin q shift and PRN.      *  See Care Plan and progress notes for long and short-term goals.     Barriers to Discharge  Current Status/Progress Possible Resolutions Date Resolved   Physician    Medical stability;Behavior  poor fluid intake  slow progress toward goals  cont rehab,will need SNF      Nursing                  PT  Inaccessible home environment;Decreased caregiver support;Home environment access/layout;Lack of/limited family support                 OT                  SLP Decreased  caregiver support              SW Lack of/limited family support;Insurance for SNF coverage Does not ahve 24 hr care has not applied for Medicaid and Disability although multiple e-mails sent to financial counselor          Discharge Planning/Teaching Needs:  Sister and girlfriend can not provide 24 hr care will need NHP. [name] can not provide care due to health and works      Team Discussion:  Goals needing to be downgraded to min-mod assist level due to lack of progress. MD checking thyroid due to pt complaining of eye issues. Dys 1 honey thick liquid diet-still receiving IVF at night. Speech working on when appropriate for MBS. No caregiver will be NHP, sister to apply for MA and disability today.  Revisions to Treatment Plan:  NHP    Continued Need for Acute Rehabilitation Level of Care: The patient requires daily medical management by a physician with specialized training in physical medicine and rehabilitation for the following conditions: Daily direction of a multidisciplinary physical rehabilitation program to ensure safe treatment while eliciting the highest outcome that is of practical value to the patient.: Yes Daily medical management of patient stability for increased activity during participation in an intensive rehabilitation regime.: Yes Daily analysis of laboratory values and/or radiology reports with any subsequent need for medication adjustment of medical intervention for : Neurological problems;Nutritional problems  Tanuj Mullens, Gardiner Rhyme 11/06/2016, 9:06 AM

## 2016-11-07 ENCOUNTER — Inpatient Hospital Stay (HOSPITAL_COMMUNITY): Payer: No Typology Code available for payment source

## 2016-11-07 ENCOUNTER — Inpatient Hospital Stay (HOSPITAL_COMMUNITY): Payer: No Typology Code available for payment source | Admitting: Occupational Therapy

## 2016-11-07 ENCOUNTER — Inpatient Hospital Stay (HOSPITAL_COMMUNITY): Payer: No Typology Code available for payment source | Admitting: Physical Therapy

## 2016-11-07 LAB — GLUCOSE, CAPILLARY
GLUCOSE-CAPILLARY: 182 mg/dL — AB (ref 65–99)
GLUCOSE-CAPILLARY: 139 mg/dL — AB (ref 65–99)
GLUCOSE-CAPILLARY: 117 mg/dL — AB (ref 65–99)
Glucose-Capillary: 95 mg/dL (ref 65–99)

## 2016-11-07 NOTE — Progress Notes (Signed)
Speech Language Pathology Daily Session Note  Patient Details  Name: Eric Morton MRN: [id number] Date of Birth: 18-Jul-1965  Today's Date: 11/07/2016 SLP Individual Time: 0803-0900 SLP Individual Time Calculation (min): 57 min  Short Term Goals: Week 2: SLP Short Term Goal 1 (Week 2): Pt will consume current diet with Min s/s asiration with supervision verbal cues for use of swallowing compensatory strategies. SLP Short Term Goal 2 (Week 2): Pt will tolerate trials of nectar thick liquids with use of compensatory strategies with Min s/s aspiration over 3 observed sessions to demonstrate readiness for diet advancement. SLP Short Term Goal 3 (Week 2): Pt will tolerate trials of Dys 2 with use of compensatory strategies with Min s/s aspiration and oral residue over 3 observed sessions to demonstrate readiness for diet advancement. SLP Short Term Goal 4 (Week 2): Pt will demonstrate effective use of communication board to express wants/needs at word level with mod I  SLP Short Term Goal 5 (Week 2): Pt will demonstrate functional problem solving for basic and familiar tasks with Supervision verbal cues. SLP Short Term Goal 6 (Week 2): Pt will utilize speech intelligibility strategies at word level with Max A verbal cues to acheive 50% intelligibility.   Skilled Therapeutic Interventions: Skilled ST services focused on speech, swallow and cognition skills. Pt demonstrated ability to consume current diet Dys 1 and HTL with no overt s/s aspiration ability to follow swallow strategies at Mod I. Pt participated in Dys 2 trials demonstrating ability to clear oral cavity in a timely manner and supervision A verbal cues to clear oral cavity and no overt s/s aspiration. Pt demonstrated ability to communicate at phrase/sentence level utilizing communication board with Min A verbal cues to slow rate. Pt demonstrated ability to express words and common phrases with 30% speech intelligibility given Max a multimodal  cues. Pt was left in room with call bell within reach. Recommend to continue ST services.      Function:  Eating Eating   Modified Consistency Diet: Yes Eating Assist Level: Supervision or verbal cues;Set up assist for   Eating Set Up Assist For: Opening containers       Cognition Comprehension Comprehension assist level: Follows basic conversation/direction with no assist  Expression Expression assistive device: Communication board Expression assist level: Expresses basic 50 - 74% of the time/requires cueing 25 - 49% of the time. Needs to repeat parts of sentences.  Social Interaction Social Interaction assist level: Interacts appropriately 90% of the time - Needs monitoring or encouragement for participation or interaction.  Problem Solving Problem solving assist level: Solves complex 90% of the time/cues < 10% of the time  Memory Memory assist level: Recognizes or recalls 90% of the time/requires cueing < 10% of the time    Pain Pain Assessment Pain Assessment: No/denies pain  Therapy/Group: Individual Therapy  [name]  [facility] 11/07/2016, 10:11 AM

## 2016-11-07 NOTE — Progress Notes (Signed)
Physical Therapy Session Note  Patient Details  Name: Eric Morton MRN: [id number] Date of Birth: 05-22-1965  Today's Date: 11/07/2016 PT Individual Time: 0905-1000 PT Individual Time Calculation (min): 55 min   Short Term Goals: Week 2:  PT Short Term Goal 1 (Week 2): Pt will transfer R and L with min assist consistently  PT Short Term Goal 2 (Week 2): Pt will ambulate 68f with mod assist and LRAd  PT Short Term Goal 3 (Week 2): Pt will perform bed mobility with min assist   Skilled Therapeutic Interventions/Progress Updates:   Pt received sitting in WC and agreeable to PT  [facility]   Transfer training with squat pivot technique. X 2  Bilaterally. Mod assist to the R and min assist to the L. Max cues for set up and sequencing to improve head/hip relationship.    Lateral scooting EOB 2 x 8 Bilaterally with min assist and moderate cues for sequecning. Max cues for proper anterior weight shift as well as improved r sided trunkal activation. Between sets, Pt instructed in lateral reaching to the L with manual faciliation of pelvic and trunk movement; significant improvement in postural control following reaching activity.   Sit<>supine transfer training x 2 through log roll technique and mod assist from PT. Max cues from PT for trunk control and sequecnin. And bed mobility training for rolling R and L. Max cues for activation of shoulder girdle to improved technique. Stabilizing reversals   Supine NMR.  SLR x 8   SAQ x 10  PNF D1 x 10 Clam shells x 10 RLE and isometric on the L  Bridge with forced use of the RLE AAROM on the LLE for all movements and max cues for full ROM and decreased compensation through trunk.   [facility] and all needs met.          Therapy Documentation Precautions:  Precautions Precautions: Fall Restrictions Weight Bearing Restrictions: No Pain: 0/10   See  Function Navigator for Current Functional Status.   Therapy/Group: Individual Therapy  [name]10/12/2016, 10:05 AM

## 2016-11-07 NOTE — Progress Notes (Signed)
Physical Therapy Session Note  Patient Details  Name: Eric Morton MRN: [id number] Date of Birth: 10-08-1965  Today's Date: 11/06/2016 PT Individual Time: 1300-1415   75 min   Short Term Goals: Week 1:  PT Short Term Goal 1 (Week 1): Pt will transfer with min assist from bed<>w/c PT Short Term Goal 1 - Progress (Week 1): Met PT Short Term Goal 2 (Week 1): Pt will ambulate x 50 ft with mod assist PT Short Term Goal 2 - Progress (Week 1): Progressing toward goal PT Short Term Goal 3 (Week 1): Pt will propel w/c 150 ft with min assist PT Short Term Goal 3 - Progress (Week 1): Met  Skilled Therapeutic Interventions/Progress Updates:    Pt received sitting in WC and agreeable to PT  PT transported pt to rehab gym in [facility] for time magement.   PT instructed pt in sit<>stand training with use of LUE and HW 2 x 5 with moderate cues for improved WB through the RLE and improved posture.   PT instructed pt in gait training at rail in hall x 44f with max assist for control of the RLE to prevent knee collapse. PT fit pt for R AFO with anterior support. additoinal gait training with HW 160fx 2 with mod-max assist. Only mild improvement in R knee control with AFO compared to no AFO.  Standing tolerance with weight shifting R and L while PT applied lite gait sling.   Gait training with light gait x 5072fith mod assist x 2 for posture, Lite gait control and R LE step length, step height, and improved terminal knee extension.   Patient returned too room and [facility] reach and all needs met.      Therapy Documentation Precautions:  Precautions Precautions: Fall Restrictions Weight Bearing Restrictions: No Pain:  0/10   See Function Navigator for Current Functional Status.   Therapy/Group: Individual Therapy  AusLorie Phenix/12/2016, 5:16 AM

## 2016-11-07 NOTE — Progress Notes (Signed)
Occupational Therapy Session Note  Patient Details  Name: Eric Morton MRN: [id number] Date of Birth: 1965-10-11  Today's Date: 11/07/2016 OT Individual Time: 1300-1415 OT Individual Time Calculation (min): 75 min    Short Term Goals: Week 2:  OT Short Term Goal 1 (Week 2): Pt will complete 1/3 toileting tasks with min steadying assist OT Short Term Goal 2 (Week 2): Pt will complete 1 grooming task in standing with steadying assist to increase functional standing tolerance/balance OT Short Term Goal 3 (Week 2): Pt will consistently complete toilet transfers with min A  Skilled Therapeutic Interventions/Progress Updates:    Pt seen for OT ADL bathing/dressing session. Pt sitting up in w/c upon arrival, agreeable to tx session and desiring to shower.  He doffed clothing from seated position with supervision, min in A to obtain figure four position with R LE. He completed stand/squat pivot transfer onto [facility] placed in shower with use of grab abrs and cuing for technique. He bathed seated on BSC, able to reach to floor to wash B feet. Attempted functional use of R UE to assist with moving washcloth along leg, however, without support at elbow pt unable to activate elbow extension/flexion needed for task and due to set-up of shower, therapist unable to get in position to offer needed assist. He returned to w/c to dress. With significantly increased time, pt able to  don shirt with complete independence. Min A to obtain figure four position to thread pants and stood at sink with min-mod steadying assist with blocking of R knee  Pt set-up with lunch tray. Able to self feed with supervision/ cuing for attention to task as pt easily distracted by things in the hallway or desire to communicate with therapist,however, able to be redicted and swallow with no s/s of  [place].  Pt left seated in w/c at end of session to cont with lunch with hand off to NT for supervision.     Therapy  Documentation Precautions:  Precautions Precautions: Fall Restrictions Weight Bearing Restrictions: No Pain:   No/ denies pain  See Function Navigator for Current Functional Status.   Therapy/Group: Individual Therapy  Lewis, Celestine Bougie C 11/07/2016, 7:12 AM

## 2016-11-07 NOTE — Progress Notes (Signed)
Subjective/Complaints:  Working with therapy, patient shows me that he can move his right leg better  ROS- not able to obtain full review due to communication issues, Objective: Vital Signs: Blood pressure (!) 163/85, pulse 74, temperature 98.4 F (36.9 C), temperature source Oral, resp. rate 20, height 5\' 7"  (1.702 m), weight 74.8 kg (165 lb), SpO2 100 %. No results found. Results for orders placed or performed during the hospital encounter of 10/27/16 (from the past 72 hour(s))  Glucose, capillary     Status: Abnormal   Collection Time: 11/04/16  9:23 PM  Result Value Ref Range   Glucose-Capillary 103 (H) 65 - 99 mg/dL   Comment 1 Notify RN   Glucose, capillary     Status: Abnormal   Collection Time: 11/05/16  6:38 AM  Result Value Ref Range   Glucose-Capillary 101 (H) 65 - 99 mg/dL   Comment 1 Notify RN   TSH     Status: None   Collection Time: 11/05/16 10:43 AM  Result Value Ref Range   TSH 1.061 0.350 - 4.500 uIU/mL    Comment: Performed by a 3rd Generation assay with a functional sensitivity of <=0.01 uIU/mL.  T4, free     Status: None   Collection Time: 11/05/16 10:43 AM  Result Value Ref Range   Free T4 0.94 0.61 - 1.12 ng/dL    Comment: (NOTE) Biotin ingestion may interfere with free T4 tests. If the results are inconsistent with the TSH level, previous test results, or the clinical presentation, then consider biotin interference. If needed, order repeat testing after stopping biotin.   Glucose, capillary     Status: Abnormal   Collection Time: 11/05/16 11:55 AM  Result Value Ref Range   Glucose-Capillary 130 (H) 65 - 99 mg/dL  Glucose, capillary     Status: None   Collection Time: 11/05/16  4:47 PM  Result Value Ref Range   Glucose-Capillary 79 65 - 99 mg/dL  Glucose, capillary     Status: Abnormal   Collection Time: 11/05/16  9:10 PM  Result Value Ref Range   Glucose-Capillary 149 (H) 65 - 99 mg/dL   Comment 1 Notify RN   Glucose, capillary     Status:  None   Collection Time: 11/06/16  6:22 AM  Result Value Ref Range   Glucose-Capillary 78 65 - 99 mg/dL   Comment 1 Notify RN   Glucose, capillary     Status: Abnormal   Collection Time: 11/06/16 11:59 AM  Result Value Ref Range   Glucose-Capillary 127 (H) 65 - 99 mg/dL  Glucose, capillary     Status: Abnormal   Collection Time: 11/06/16  4:37 PM  Result Value Ref Range   Glucose-Capillary 202 (H) 65 - 99 mg/dL  Glucose, capillary     Status: Abnormal   Collection Time: 11/06/16  8:54 PM  Result Value Ref Range   Glucose-Capillary 191 (H) 65 - 99 mg/dL  Glucose, capillary     Status: None   Collection Time: 11/07/16  6:43 AM  Result Value Ref Range   Glucose-Capillary 95 65 - 99 mg/dL   Comment 1 Notify RN   Glucose, capillary     Status: Abnormal   Collection Time: 11/07/16 11:38 AM  Result Value Ref Range   Glucose-Capillary 182 (H) 65 - 99 mg/dL     HEENT:poor dentition, exopthalmos, no ptosis EOMI Cardio: RRR and no murmur Resp: CTA B/L and unlabored GI: BS positive and NT, ND Extremity:  No Edema Skin:  Intact Neuro: Abnormal Sensory difficult to assess due to dysarthria, Abnormal Motor 0/5 RUE trace elbow flex, 2- Left knee extensor, Dysarthric and no emotional lability- Musc/Skel:  Other mild tenderness to palpation over lumbar paraspinals Gen NAD   Assessment/Plan: 1. Functional deficits secondary to Right hemiparesis , dysarthria, dysphagia which require 3+ hours per day of interdisciplinary therapy in a comprehensive inpatient rehab setting. Physiatrist is providing close team supervision and 24 hour management of active medical problems listed below. Physiatrist and rehab team continue to assess barriers to discharge/monitor patient progress toward functional and medical goals. FIM: Function - Bathing Bathing activity did not occur: Refused Position: Shower Body parts bathed by patient: Right arm, Chest, Abdomen, Front perineal area, Left lower leg, Right  upper leg, Left upper leg, Buttocks, Right lower leg Body parts bathed by helper: Back, Left arm Assist Level: Touching or steadying assistance(Pt > 75%)  Function- Upper Body Dressing/Undressing What is the patient wearing?: Pull over shirt/dress Pull over shirt/dress - Perfomed by patient: Thread/unthread right sleeve, Put head through opening, Thread/unthread left sleeve, Pull shirt over trunk Pull over shirt/dress - Perfomed by helper: Pull shirt over trunk Assist Level: More than reasonable time Function - Lower Body Dressing/Undressing What is the patient wearing?: Underwear, Pants, Non-skid slipper socks Position: Wheelchair/chair at sink Underwear - Performed by patient: Thread/unthread right underwear leg, Thread/unthread left underwear leg, Pull underwear up/down Underwear - Performed by helper: Pull underwear up/down Pants- Performed by patient: Thread/unthread left pants leg, Thread/unthread right pants leg, Pull pants up/down Pants- Performed by helper: Thread/unthread right pants leg, Pull pants up/down Non-skid slipper socks- Performed by patient: Don/doff right sock, Don/doff left sock Non-skid slipper socks- Performed by helper: Don/doff left sock, Don/doff right sock Assist for footwear: Supervision/touching assist Assist for lower body dressing: Touching or steadying assistance (Pt > 75%)  Function - Toileting Toileting steps completed by patient: Adjust clothing prior to toileting, Performs perineal hygiene, Adjust clothing after toileting Toileting steps completed by helper: Adjust clothing prior to toileting, Adjust clothing after toileting, Performs perineal hygiene Toileting Assistive Devices: Grab bar or rail Assist level: Touching or steadying assistance (Pt.75%)  Function - Air cabin crew transfer assistive device: Grab bar Assist level to toilet: Maximal assist (Pt 25 - 49%/lift and lower) Assist level from toilet: Touching or steadying assistance  (Pt > 75%)  Function - Chair/bed transfer Chair/bed transfer method: Squat pivot Chair/bed transfer assist level: Moderate assist (Pt 50 - 74%/lift or lower) Chair/bed transfer assistive device: Armrests Chair/bed transfer details: Tactile cues for sequencing, Verbal cues for sequencing, Verbal cues for technique, Verbal cues for precautions/safety, Tactile cues for weight shifting  Function - Locomotion: Wheelchair Will patient use wheelchair at discharge?: Yes Type: Manual Max wheelchair distance: 123ft  Assist Level: Supervision or verbal cues Assist Level: Supervision or verbal cues Wheel 150 feet activity did not occur: Safety/medical concerns Assist Level: Supervision or verbal cues Turns around,maneuvers to table,bed, and toilet,negotiates 3% grade,maneuvers on rugs and over doorsills: No Function - Locomotion: Ambulation Assistive device: Rail in hallway Max distance: 78ft Assist level: Moderate assist (Pt 50 - 74%) Assist level: 2 helpers (+2 for w/c follow; mod A otherwise) Walk 50 feet with 2 turns activity did not occur: Safety/medical concerns Walk 150 feet activity did not occur: Safety/medical concerns Walk 10 feet on uneven surfaces activity did not occur: Safety/medical concerns  Function - Comprehension Comprehension: Auditory Comprehension assist level: Follows basic conversation/direction with no assist  Function - Expression Expression: Nonverbal Expression assistive device:  Communication board Expression assist level: Expresses basic 50 - 74% of the time/requires cueing 25 - 49% of the time. Needs to repeat parts of sentences.  Function - Social Interaction Social Interaction assist level: Interacts appropriately 90% of the time - Needs monitoring or encouragement for participation or interaction.  Function - Problem Solving Problem solving assist level: Solves complex 90% of the time/cues < 10% of the time  Function - Memory Memory assist level:  Recognizes or recalls 90% of the time/requires cueing < 10% of the time Patient normally able to recall (first 3 days only): Staff names and faces, That he or she is in a hospital   Medical Problem List and Plan: 1.  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  DVT Prophylaxis/Anticoagulation: Subcutaneous Lovenox. Venous Doppler studies negative, PLT 137K monitor 3. Pain Management: Neurontin 300 mg daily at bedtime 4. Mood: Ego support 5. Neuropsych: This patient is capable of making decisions on his own behalf. 6. Skin/Wound Care:  Routine skin checks 7. Fluids/Electrolytes/Nutrition:  Routine I&O with follow-up chemistries 8. Dysphagia. Dysphagia #1 honey liquids. Monitor hydration. Follow-up speech therapy 9.Hypertension. Lisinopril 10mg  daily increased on 10/6 - elevated systolic 75/64 Increase to 20mg  on 10/12 Vitals:   11/07/16 0500 11/07/16 1542  BP: (!) 149/69 (!) 163/85  Pulse: 66 74  Resp: 16 20  Temp: 97.8 F (36.6 C) 98.4 F (36.9 C)  SpO2: 100% 100%   10. Diabetes mellitus peripheral neuropathy. Hemoglobin A1c 8.9. SSI.Check blood sugars before meals and at bedtime. Patient on Glucotrol 5 mg twice a day, Glucophage 1000 mg twice a day. Resume as needed CBG (last 3)   Recent Labs  11/06/16 2054 11/07/16 0643 11/07/16 1138  GLUCAP 191* 95 182*  fair control 10/12- no current diabetic meds, if values increase may need to resume 11.Polysubstance abuse. UDS positive for cocaine. Provide counseling with Neuropsych 12.History polycythemia. Follow-up labs 13. ?PBA: Monitor.  Will consider Neuropsych, improved , on sertraline 14.  Buccal surface laceration likely related to dyscoordinated oral movements, left side over salivary duct will schedule MMW 15.  Hypoalbuminemia- start prostat 16.  Pt c/o  difficulty holding eyes open , no ptosis or EOM weakness, severe dysarthria and R hemiparesis obscures neuro exam, check ACH-R antibody 17.  Exopthalmos- normal TSH  T4 results, LOS (Days) 11 A FACE TO FACE EVALUATION WAS PERFORMED  Lysette Lindenbaum E 11/07/2016, 5:10 PM

## 2016-11-08 ENCOUNTER — Inpatient Hospital Stay (HOSPITAL_COMMUNITY): Payer: No Typology Code available for payment source | Admitting: Occupational Therapy

## 2016-11-08 ENCOUNTER — Inpatient Hospital Stay (HOSPITAL_COMMUNITY): Payer: No Typology Code available for payment source | Admitting: Physical Therapy

## 2016-11-08 LAB — GLUCOSE, CAPILLARY
GLUCOSE-CAPILLARY: 102 mg/dL — AB (ref 65–99)
GLUCOSE-CAPILLARY: 139 mg/dL — AB (ref 65–99)
Glucose-Capillary: 124 mg/dL — ABNORMAL HIGH (ref 65–99)
Glucose-Capillary: 134 mg/dL — ABNORMAL HIGH (ref 65–99)

## 2016-11-08 NOTE — Progress Notes (Signed)
Physical Therapy Session Note  Patient Details  Name: Eric Morton MRN: [id number] Date of Birth: [birth year]  Today's Date: 11/08/2016 PT Individual Time: 1100-1130 PT Individual Time Calculation (min): 30 min   Short Term Goals: Week 2:  PT Short Term Goal 1 (Week 2): Pt will transfer R and L with min assist consistently  PT Short Term Goal 2 (Week 2): Pt will ambulate 72f with mod assist and LRAd  PT Short Term Goal 3 (Week 2): Pt will perform bed mobility with min assist   Skilled Therapeutic Interventions/Progress Updates:   Pt received sitting in WC and agreeable to PT  WC mobility with L hemi technique and supervision assist from PT. Min cues for doorway management and direction in semi-familiar environment.   PT instructed PT in RLE NMR and postural re-education. To kick ball x 5 RLE  and lateral/cross body reaches with the LUE..Marland KitchenPt also instructed in trunk rotation on the RUE x 10 Bilaterally with min-mod assist for reaches to the R and trunk rotation leading the the R shoulder.   [facility] and all needs met.        Therapy Documentation Precautions:  Precautions Precautions: Fall Restrictions Weight Bearing Restrictions: No    Pain:   0/10   See Function Navigator for Current Functional Status.   Therapy/Group: Individual Therapy  [name]10/13/2018, 1:00 PM

## 2016-11-08 NOTE — Progress Notes (Addendum)
Occupational Therapy Session Note  Patient Details  Name: Eric Morton MRN: [id number] Date of Birth: 06-06-1965  Today's Date: 11/08/2016 OT Individual Time: 6962-9528 OT Individual Time Calculation (min): 35 min   Short Term Goals: Week 2:  OT Short Term Goal 1 (Week 2): Pt will complete 1/3 toileting tasks with min steadying assist OT Short Term Goal 2 (Week 2): Pt will complete 1 grooming task in standing with steadying assist to increase functional standing tolerance/balance OT Short Term Goal 3 (Week 2): Pt will consistently complete toilet transfers with min A    Skilled Therapeutic Interventions/Progress Updates:    Tx focus on Rt NMR and activity tolerance during meaningful activities. Pt greeted in w/c, ready to go.   Pt self propelled to dayroom using hemi technique, mindful of avoiding obstacles on Rt side. Once in dayroom, pt requesting to involve music in tx. He engaged in seated dancing. Worked on bilateral and reciprocal shoulder movements with use of mirror for visual biofeedback, and modeling from OT for NMR. Pt with noticeable shoulder/trunk activation during gravity eliminated movements! Active assist ROM in beat to meaningful R&B music until pt fatigued due to weight of affected UE. At end of tx pt was escorted back to room and left with all needs within reach.   Therapy Documentation Precautions:  Precautions Precautions: Fall Restrictions Weight Bearing Restrictions: No Vital Signs: Therapy Vitals Temp: 98 F (36.7 C) Temp Source: Oral Pulse Rate: 63 Resp: 18 BP: (!) 184/87 Patient Position (if appropriate): Sitting Oxygen Therapy SpO2: 100 % O2 Device: Not Delivered Pain: No c/o pain during tx    ADL:   See Function Navigator for Current Functional Status.   Therapy/Group: Individual Therapy  Quinlyn Tep A Montavis Schubring 11/08/2016, 4:24 PM

## 2016-11-09 ENCOUNTER — Inpatient Hospital Stay (HOSPITAL_COMMUNITY): Payer: No Typology Code available for payment source | Admitting: Physical Therapy

## 2016-11-09 LAB — GLUCOSE, CAPILLARY
GLUCOSE-CAPILLARY: 112 mg/dL — AB (ref 65–99)
Glucose-Capillary: 96 mg/dL (ref 65–99)
Glucose-Capillary: 125 mg/dL — ABNORMAL HIGH (ref 65–99)
Glucose-Capillary: 162 mg/dL — ABNORMAL HIGH (ref 65–99)
Glucose-Capillary: 132 mg/dL — ABNORMAL HIGH (ref 65–99)

## 2016-11-09 NOTE — Progress Notes (Signed)
Physical Therapy Session Note  Patient Details  Name: Eric Morton MRN: [id number] Date of Birth: 03-05-1965  Today's Date: 11/09/2016 PT Individual Time: 1530-1555 PT Individual Time Calculation (min): 25 min   Short Term Goals: Week 2:  PT Short Term Goal 1 (Week 2): Pt will transfer R and L with min assist consistently  PT Short Term Goal 2 (Week 2): Pt will ambulate 53f with mod assist and LRAd  PT Short Term Goal 3 (Week 2): Pt will perform bed mobility with min assist   Skilled Therapeutic Interventions/Progress Updates: Pt presented in recliner agreeable to therapy. Session focused on transfers and R NMR. Pt participated in sit to/from stand 2 x 5 with cues for increased R wt shift, increased safety with HW, manual facilitation for quad activation, pt performed consistently with minA. Performed seated R seated heel slides(with washcloth under foot) for forced use RLE. Performed hip flexion x 10 with cues for breathing throughout activity. Pt remained in recliner at end of session with call bell within reach and needs met.      Therapy Documentation Precautions:  Precautions Precautions: Fall Restrictions Weight Bearing Restrictions: No General:   Vital Signs: Therapy Vitals Temp: 98.2 F (36.8 C) Temp Source: Oral Pulse Rate: 77 Resp: 18 BP: 120/64 Patient Position (if appropriate): Sitting Oxygen Therapy SpO2: 100 % O2 Device: Not Delivered   See Function Navigator for Current Functional Status.   Therapy/Group: Individual Therapy  Jameyah Fennewald  Afiya Ferrebee, PTA  11/09/2016, 4:05 PM

## 2016-11-09 NOTE — Progress Notes (Signed)
Subjective/Complaints:  No issues overnite  ROS- not able to obtain full review due to communication issues, Objective: Vital Signs: Blood pressure 140/78, pulse (!) 117, temperature 98.4 F (36.9 C), temperature source Oral, resp. rate 18, height 5\' 7"  (1.702 m), weight 74.8 kg (165 lb), SpO2 100 %. No results found. Results for orders placed or performed during the hospital encounter of 10/27/16 (from the past 72 hour(s))  Glucose, capillary     Status: Abnormal   Collection Time: 11/06/16 11:59 AM  Result Value Ref Range   Glucose-Capillary 127 (H) 65 - 99 mg/dL  Glucose, capillary     Status: Abnormal   Collection Time: 11/06/16  4:37 PM  Result Value Ref Range   Glucose-Capillary 202 (H) 65 - 99 mg/dL  Glucose, capillary     Status: Abnormal   Collection Time: 11/06/16  8:54 PM  Result Value Ref Range   Glucose-Capillary 191 (H) 65 - 99 mg/dL  Glucose, capillary     Status: None   Collection Time: 11/07/16  6:43 AM  Result Value Ref Range   Glucose-Capillary 95 65 - 99 mg/dL   Comment 1 Notify RN   Glucose, capillary     Status: Abnormal   Collection Time: 11/07/16 11:38 AM  Result Value Ref Range   Glucose-Capillary 182 (H) 65 - 99 mg/dL  Glucose, capillary     Status: Abnormal   Collection Time: 11/07/16  5:05 PM  Result Value Ref Range   Glucose-Capillary 139 (H) 65 - 99 mg/dL  Glucose, capillary     Status: Abnormal   Collection Time: 11/07/16  9:18 PM  Result Value Ref Range   Glucose-Capillary 117 (H) 65 - 99 mg/dL   Comment 1 Notify RN   Glucose, capillary     Status: Abnormal   Collection Time: 11/08/16  6:44 AM  Result Value Ref Range   Glucose-Capillary 102 (H) 65 - 99 mg/dL   Comment 1 Notify RN   Glucose, capillary     Status: Abnormal   Collection Time: 11/08/16 11:52 AM  Result Value Ref Range   Glucose-Capillary 139 (H) 65 - 99 mg/dL  Glucose, capillary     Status: Abnormal   Collection Time: 11/08/16  4:34 PM  Result Value Ref Range    Glucose-Capillary 124 (H) 65 - 99 mg/dL  Glucose, capillary     Status: Abnormal   Collection Time: 11/08/16  9:46 PM  Result Value Ref Range   Glucose-Capillary 134 (H) 65 - 99 mg/dL   Comment 1 Notify RN   Glucose, capillary     Status: Abnormal   Collection Time: 11/09/16 12:59 AM  Result Value Ref Range   Glucose-Capillary 112 (H) 65 - 99 mg/dL   Comment 1 Notify RN   Glucose, capillary     Status: None   Collection Time: 11/09/16  6:41 AM  Result Value Ref Range   Glucose-Capillary 96 65 - 99 mg/dL   Comment 1 Notify RN      HEENT:poor dentition, exopthalmos, no ptosis EOMI Cardio: RRR and no murmur Resp: CTA B/L and unlabored GI: BS positive and NT, ND Extremity:  No Edema Skin:   Intact Neuro: Abnormal Sensory difficult to assess due to dysarthria, Abnormal Motor 0/5 RUE trace elbow flex, 2- Left knee extensor, Dysarthric and no emotional lability- Musc/Skel:  Other mild tenderness to palpation over lumbar paraspinals Gen NAD   Assessment/Plan: 1. Functional deficits secondary to Right hemiparesis , dysarthria, dysphagia which require 3+ hours per day  of interdisciplinary therapy in a comprehensive inpatient rehab setting. Physiatrist is providing close team supervision and 24 hour management of active medical problems listed below. Physiatrist and rehab team continue to assess barriers to discharge/monitor patient progress toward functional and medical goals. FIM: Function - Bathing Bathing activity did not occur: Refused Position: Shower Body parts bathed by patient: Right arm, Chest, Abdomen, Front perineal area, Left lower leg, Right upper leg, Left upper leg, Buttocks, Right lower leg Body parts bathed by helper: Back, Left arm Assist Level: Touching or steadying assistance(Pt > 75%)  Function- Upper Body Dressing/Undressing What is the patient wearing?: Pull over shirt/dress Pull over shirt/dress - Perfomed by patient: Thread/unthread right sleeve, Put head  through opening, Thread/unthread left sleeve, Pull shirt over trunk Pull over shirt/dress - Perfomed by helper: Pull shirt over trunk Assist Level: More than reasonable time Function - Lower Body Dressing/Undressing What is the patient wearing?: Underwear, Pants, Non-skid slipper socks Position: Wheelchair/chair at sink Underwear - Performed by patient: Thread/unthread right underwear leg, Thread/unthread left underwear leg, Pull underwear up/down Underwear - Performed by helper: Pull underwear up/down Pants- Performed by patient: Thread/unthread left pants leg, Thread/unthread right pants leg, Pull pants up/down Pants- Performed by helper: Thread/unthread right pants leg, Pull pants up/down Non-skid slipper socks- Performed by patient: Don/doff right sock, Don/doff left sock Non-skid slipper socks- Performed by helper: Don/doff left sock, Don/doff right sock Assist for footwear: Supervision/touching assist Assist for lower body dressing: Touching or steadying assistance (Pt > 75%)  Function - Toileting Toileting steps completed by patient: Adjust clothing prior to toileting, Performs perineal hygiene, Adjust clothing after toileting Toileting steps completed by helper: Adjust clothing prior to toileting, Adjust clothing after toileting, Performs perineal hygiene Toileting Assistive Devices: Grab bar or rail Assist level: Touching or steadying assistance (Pt.75%)  Function - Air cabin crew transfer assistive device: Grab bar Assist level to toilet: Maximal assist (Pt 25 - 49%/lift and lower) Assist level from toilet: Touching or steadying assistance (Pt > 75%)  Function - Chair/bed transfer Chair/bed transfer method: Squat pivot Chair/bed transfer assist level: Moderate assist (Pt 50 - 74%/lift or lower) Chair/bed transfer assistive device: Armrests Chair/bed transfer details: Tactile cues for sequencing, Verbal cues for sequencing, Verbal cues for technique, Verbal cues for  precautions/safety, Tactile cues for weight shifting  Function - Locomotion: Wheelchair Will patient use wheelchair at discharge?: Yes Type: Manual Max wheelchair distance: 120ft  Assist Level: Supervision or verbal cues Assist Level: Supervision or verbal cues Wheel 150 feet activity did not occur: Safety/medical concerns Assist Level: Supervision or verbal cues Turns around,maneuvers to table,bed, and toilet,negotiates 3% grade,maneuvers on rugs and over doorsills: No Function - Locomotion: Ambulation Assistive device: Walker-hemi Max distance: 38ft  Assist level: Maximal assist (Pt 25 - 49%) Assist level: Maximal assist (Pt 25 - 49%) Walk 50 feet with 2 turns activity did not occur: Safety/medical concerns Walk 150 feet activity did not occur: Safety/medical concerns Walk 10 feet on uneven surfaces activity did not occur: Safety/medical concerns  Function - Comprehension Comprehension: Auditory Comprehension assist level: Follows basic conversation/direction with no assist  Function - Expression Expression: Nonverbal Expression assistive device: Communication board Expression assist level: Expresses basic 50 - 74% of the time/requires cueing 25 - 49% of the time. Needs to repeat parts of sentences.  Function - Social Interaction Social Interaction assist level: Interacts appropriately 90% of the time - Needs monitoring or encouragement for participation or interaction.  Function - Problem Solving Problem solving assist level: Solves complex  90% of the time/cues < 10% of the time  Function - Memory Memory assist level: Recognizes or recalls 90% of the time/requires cueing < 10% of the time Patient normally able to recall (first 3 days only): Staff names and faces, That he or she is in a hospital   Medical Problem List and Plan: 1.  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  DVT Prophylaxis/Anticoagulation: Subcutaneous Lovenox. Venous Doppler studies negative, PLT 137K monitor 3. Pain Management: Neurontin 300 mg daily at bedtime 4. Mood: Ego support 5. Neuropsych: This patient is capable of making decisions on his own behalf. 6. Skin/Wound Care:  Routine skin checks 7. Fluids/Electrolytes/Nutrition:  Routine I&O with follow-up chemistries 8. Dysphagia. Dysphagia #1 honey liquids. Monitor hydration. Follow-up speech therapy 9.Hypertension. Lisinopril 10mg  daily increased on 10/6 - elevated systolic 09/81 Increase to 20mg  on 10/12, labile BP , HR variable as well no med changes, asymptomatic Vitals:   11/08/16 1522 11/09/16 0540  BP: (!) 184/87 140/78  Pulse: 63 (!) 117  Resp: 18 18  Temp: 98 F (36.7 C) 98.4 F (36.9 C)  SpO2: 100% 100%   10. Diabetes mellitus peripheral neuropathy. Hemoglobin A1c 8.9. SSI.Check blood sugars before meals and at bedtime. Patient on Glucotrol 5 mg twice a day, Glucophage 1000 mg twice a day. Resume as needed CBG (last 3)   Recent Labs  11/08/16 2146 11/09/16 0059 11/09/16 0641  GLUCAP 134* 112* 96  good control 10/14- no current diabetic meds, if values increase may need to resume 11.Polysubstance abuse. UDS positive for cocaine. Provide counseling with Neuropsych 12.History polycythemia. Follow-up labs 13. ?PBA: Monitor.  Will consider Neuropsych, improved , on sertraline 14.  Buccal surface laceration likely related to dyscoordinated oral movements, left side over salivary duct will schedule MMW 15.  Hypoalbuminemia- start prostat 16.  Pt c/o difficulty holding eyes open , no ptosis or EOM weakness, severe dysarthria and R hemiparesis obscures neuro exam, check ACH-R antibody 17.  Exopthalmos- normal TSH  T4 results, LOS (Days) 13 A FACE TO FACE EVALUATION WAS PERFORMED  Crawford Tamura E 11/09/2016, 9:42 AM

## 2016-11-10 ENCOUNTER — Inpatient Hospital Stay (HOSPITAL_COMMUNITY): Payer: No Typology Code available for payment source | Admitting: Physical Therapy

## 2016-11-10 ENCOUNTER — Inpatient Hospital Stay (HOSPITAL_COMMUNITY): Payer: Medicaid Other | Admitting: Speech Pathology

## 2016-11-10 ENCOUNTER — Inpatient Hospital Stay (HOSPITAL_COMMUNITY): Payer: No Typology Code available for payment source | Admitting: Occupational Therapy

## 2016-11-10 ENCOUNTER — Inpatient Hospital Stay (HOSPITAL_COMMUNITY): Payer: Medicaid Other | Admitting: Occupational Therapy

## 2016-11-10 LAB — GLUCOSE, CAPILLARY
GLUCOSE-CAPILLARY: 95 mg/dL (ref 65–99)
GLUCOSE-CAPILLARY: 107 mg/dL — AB (ref 65–99)
Glucose-Capillary: 174 mg/dL — ABNORMAL HIGH (ref 65–99)
Glucose-Capillary: 172 mg/dL — ABNORMAL HIGH (ref 65–99)

## 2016-11-10 LAB — CREATININE, SERUM
Creatinine, Ser: 0.97 mg/dL (ref 0.61–1.24)
GFR calc non Af Amer: >60 mL/min (ref >60)

## 2016-11-10 MED ORDER — GLIPIZIDE 5 MG PO TABS
2.5000 mg | ORAL_TABLET | Freq: Every day | ORAL | Status: DC
  Administered 2016-11-11 – 2016-11-17 (×7): 2.5 mg via ORAL
  Filled 2016-11-10 (×7): qty 1

## 2016-11-10 MED ORDER — AMLODIPINE BESYLATE 2.5 MG PO TABS
2.5000 mg | ORAL_TABLET | Freq: Every day | ORAL | Status: DC
  Administered 2016-11-10 – 2016-11-26 (×17): 2.5 mg via ORAL
  Filled 2016-11-10 (×17): qty 1

## 2016-11-10 NOTE — Progress Notes (Signed)
Occupational Therapy Session Note  Patient Details  Name: Eric Morton MRN: [id number] Date of Birth: Jan 18, 1966  Today's Date: 11/10/2016 OT Individual Time: 1005-1101 OT Individual Time Calculation (min): 56 min    Short Term Goals: Week 2:  OT Short Term Goal 1 (Week 2): Pt will complete 1/3 toileting tasks with min steadying assist OT Short Term Goal 2 (Week 2): Pt will complete 1 grooming task in standing with steadying assist to increase functional standing tolerance/balance OT Short Term Goal 3 (Week 2): Pt will consistently complete toilet transfers with min A  Skilled Therapeutic Interventions/Progress Updates:    Pt seated in w/c upon OT arrival and was agreeable to participating in ADL retraining session.  Pt doffed majority of clothing while seated in w/c and completed stand step transfer into shower with Min A.  Steadying A provided as pt doffed pants over hips.  Pt bathed self with increased time and A to position RUE as well as to bathe LUE and back.  Pt completed transfer to w/c with Mod A to correct LOB d/t R knee buckling.  Pt dressed self with increased time and A to position BLEs into figure 4 shape.  A provided to don pants fully over R hip while standing at sink.  Pt demonstrated good use of hemi-techniques throughout session.  Pt left seated in w/c with all needs in reach upon OT departure.  Therapy Documentation Precautions:  Precautions Precautions: Fall Restrictions Weight Bearing Restrictions: No Pain: Pain Assessment Pain Assessment: No/denies pain  See Function Navigator for Current Functional Status.   Therapy/Group: Individual Therapy  Marcella Dubs 11/10/2016, 3:08 PM

## 2016-11-10 NOTE — Progress Notes (Signed)
Physical Therapy Session Note  Patient Details  Name: Eric Morton MRN: [id number] Date of Birth: April 16, 1965  Today's Date: 11/10/2016 PT Individual Time: 0805-0900 PT Individual Time Calculation (min): 55 min   Short Term Goals: Week 2:  PT Short Term Goal 1 (Week 2): Pt will transfer R and L with min assist consistently  PT Short Term Goal 2 (Week 2): Pt will ambulate 48f with mod assist and LRAd  PT Short Term Goal 3 (Week 2): Pt will perform bed mobility with min assist   Skilled Therapeutic Interventions/Progress Updates: Pt presented in bed with nsg present agreeable to therapy with some encouragement. PTA donned pants and pt able to pull up over briefs with supervision and increased time.  Performed supine to sit minA with cues for sequencing and hand placement. Squat pivot to w/c with minA and pt propelled to day room with supervision with MD stopping pt along way to perform assessment. Pt performed Kinetron 50cm/sec 2 x 5 min for RLE activation and reciprocal pattern. PTA placed theraband around knees to maintain RLE neutral position. Pt transported to rehab gym and performed gait training 249fx 1 with HW and modA. Pt required multimodal cues for sequencing, R quad activation, safe use of HW and erect posture. Pt did demonstrate improvement in sequencing with distance. Pt returned to w/c and propelled back to room in same manner as prior. Pt left in w/c at end of session with call bell within reach and needs met.       Therapy Documentation Precautions:  Precautions Precautions: Fall Restrictions Weight Bearing Restrictions: No General:   Vital Signs:   See Function Navigator for Current Functional Status.   Therapy/Group: Individual Therapy  Lorynn Moeser  Cheyanne Lamison, PTA  11/10/2016, 1:18 PM

## 2016-11-10 NOTE — Progress Notes (Signed)
Occupational Therapy Session Note  Patient Details  Name: Eric Morton MRN: [id number] Date of Birth: Nov 25, 1965  Today's Date: 11/10/2016 OT Individual Time: 7782-4235 OT Individual Time Calculation (min): 42 min    Short Term Goals: Week 2:  OT Short Term Goal 1 (Week 2): Pt will complete 1/3 toileting tasks with min steadying assist OT Short Term Goal 2 (Week 2): Pt will complete 1 grooming task in standing with steadying assist to increase functional standing tolerance/balance OT Short Term Goal 3 (Week 2): Pt will consistently complete toilet transfers with min A  Skilled Therapeutic Interventions/Progress Updates:     Upon entering the room, pt seated in wheelchair with no c/o pain this session. Pt utilized Data processing manager with increased time to answer therapist questions related to pt needs and orientation. OT assisted pt via wheelchair for dynavision task. Pt initially engaged in 2 minutes of dynavision seated and able to hit 54 targets with average reaction time of 2.14. Pt standing with R knee blocked with min - mod A for standing balance while able to hit 65 targets with average reaction time of 1.85 seconds. Pt reaching in all directions and crossing midline. Pt shifting weight to R side as reaching for targets across midline. Pt returning to wheelchair and assisted back to room. Pt requesting to remain in wheelchair. Call bell and all needed items within reach upon exiting the room.   Therapy Documentation Precautions:  Precautions Precautions: Fall Restrictions Weight Bearing Restrictions: No Other Treatments:    See Function Navigator for Current Functional Status.   Therapy/Group: Individual Therapy  Gypsy Decant 11/10/2016, 2:15 PM

## 2016-11-10 NOTE — Progress Notes (Signed)
Subjective/Complaints:  No issues overnite  ROS- not able to obtain full review due to communication issues, Objective: Vital Signs: Blood pressure (!) 155/93, pulse 79, temperature 98.4 F (36.9 C), temperature source Oral, resp. rate 18, height 5\' 7"  (1.702 m), weight 74.8 kg (165 lb), SpO2 99 %. No results found. Results for orders placed or performed during the hospital encounter of 10/27/16 (from the past 72 hour(s))  Glucose, capillary     Status: Abnormal   Collection Time: 11/07/16 11:38 AM  Result Value Ref Range   Glucose-Capillary 182 (H) 65 - 99 mg/dL  Glucose, capillary     Status: Abnormal   Collection Time: 11/07/16  5:05 PM  Result Value Ref Range   Glucose-Capillary 139 (H) 65 - 99 mg/dL  Glucose, capillary     Status: Abnormal   Collection Time: 11/07/16  9:18 PM  Result Value Ref Range   Glucose-Capillary 117 (H) 65 - 99 mg/dL   Comment 1 Notify RN   Glucose, capillary     Status: Abnormal   Collection Time: 11/08/16  6:44 AM  Result Value Ref Range   Glucose-Capillary 102 (H) 65 - 99 mg/dL   Comment 1 Notify RN   Glucose, capillary     Status: Abnormal   Collection Time: 11/08/16 11:52 AM  Result Value Ref Range   Glucose-Capillary 139 (H) 65 - 99 mg/dL  Glucose, capillary     Status: Abnormal   Collection Time: 11/08/16  4:34 PM  Result Value Ref Range   Glucose-Capillary 124 (H) 65 - 99 mg/dL  Glucose, capillary     Status: Abnormal   Collection Time: 11/08/16  9:46 PM  Result Value Ref Range   Glucose-Capillary 134 (H) 65 - 99 mg/dL   Comment 1 Notify RN   Glucose, capillary     Status: Abnormal   Collection Time: 11/09/16 12:59 AM  Result Value Ref Range   Glucose-Capillary 112 (H) 65 - 99 mg/dL   Comment 1 Notify RN   Glucose, capillary     Status: None   Collection Time: 11/09/16  6:41 AM  Result Value Ref Range   Glucose-Capillary 96 65 - 99 mg/dL   Comment 1 Notify RN   Glucose, capillary     Status: Abnormal   Collection Time:  11/09/16 11:52 AM  Result Value Ref Range   Glucose-Capillary 125 (H) 65 - 99 mg/dL  Glucose, capillary     Status: Abnormal   Collection Time: 11/09/16  4:41 PM  Result Value Ref Range   Glucose-Capillary 162 (H) 65 - 99 mg/dL  Glucose, capillary     Status: Abnormal   Collection Time: 11/09/16  8:48 PM  Result Value Ref Range   Glucose-Capillary 132 (H) 65 - 99 mg/dL     HEENT:poor dentition, exopthalmos, no ptosis EOMI Cardio: RRR and no murmur Resp: CTA B/L and unlabored GI: BS positive and NT, ND Extremity:  No Edema Skin:   Intact Neuro: Abnormal Sensory difficult to assess due to dysarthria, Abnormal Motor 0/5 RUE trace elbow flex, 2- Left knee extensor, Dysarthric and no emotional lability- Musc/Skel:  Other mild tenderness to palpation over lumbar paraspinals Gen NAD   Assessment/Plan: 1. Functional deficits secondary to Right hemiparesis , dysarthria, dysphagia which require 3+ hours per day of interdisciplinary therapy in a comprehensive inpatient rehab setting. Physiatrist is providing close team supervision and 24 hour management of active medical problems listed below. Physiatrist and rehab team continue to assess barriers to discharge/monitor patient progress  toward functional and medical goals. FIM: Function - Bathing Bathing activity did not occur: Refused Position: Shower Body parts bathed by patient: Right arm, Chest, Abdomen, Front perineal area, Left lower leg, Right upper leg, Left upper leg, Buttocks, Right lower leg Body parts bathed by helper: Back, Left arm Assist Level: Touching or steadying assistance(Pt > 75%)  Function- Upper Body Dressing/Undressing What is the patient wearing?: Pull over shirt/dress Pull over shirt/dress - Perfomed by patient: Thread/unthread right sleeve, Put head through opening, Thread/unthread left sleeve, Pull shirt over trunk Pull over shirt/dress - Perfomed by helper: Pull shirt over trunk Assist Level: More than  reasonable time Function - Lower Body Dressing/Undressing What is the patient wearing?: Underwear, Pants, Non-skid slipper socks Position: Wheelchair/chair at sink Underwear - Performed by patient: Thread/unthread right underwear leg, Thread/unthread left underwear leg, Pull underwear up/down Underwear - Performed by helper: Pull underwear up/down Pants- Performed by patient: Thread/unthread left pants leg, Thread/unthread right pants leg, Pull pants up/down Pants- Performed by helper: Thread/unthread right pants leg, Pull pants up/down Non-skid slipper socks- Performed by patient: Don/doff right sock, Don/doff left sock Non-skid slipper socks- Performed by helper: Don/doff left sock, Don/doff right sock Assist for footwear: Supervision/touching assist Assist for lower body dressing: Touching or steadying assistance (Pt > 75%)  Function - Toileting Toileting steps completed by patient: Adjust clothing prior to toileting, Performs perineal hygiene, Adjust clothing after toileting Toileting steps completed by helper: Adjust clothing prior to toileting, Adjust clothing after toileting, Performs perineal hygiene Toileting Assistive Devices: Grab bar or rail Assist level: Touching or steadying assistance (Pt.75%)  Function - Air cabin crew transfer assistive device: Grab bar Assist level to toilet: Maximal assist (Pt 25 - 49%/lift and lower) Assist level from toilet: Touching or steadying assistance (Pt > 75%)  Function - Chair/bed transfer Chair/bed transfer method: Squat pivot Chair/bed transfer assist level: Moderate assist (Pt 50 - 74%/lift or lower) Chair/bed transfer assistive device: Armrests Chair/bed transfer details: Tactile cues for sequencing, Verbal cues for sequencing, Verbal cues for technique, Verbal cues for precautions/safety, Tactile cues for weight shifting  Function - Locomotion: Wheelchair Will patient use wheelchair at discharge?: Yes Type: Manual Max  wheelchair distance: 126ft  Assist Level: Supervision or verbal cues Assist Level: Supervision or verbal cues Wheel 150 feet activity did not occur: Safety/medical concerns Assist Level: Supervision or verbal cues Turns around,maneuvers to table,bed, and toilet,negotiates 3% grade,maneuvers on rugs and over doorsills: No Function - Locomotion: Ambulation Assistive device: Walker-hemi Max distance: 27ft  Assist level: Maximal assist (Pt 25 - 49%) Assist level: Maximal assist (Pt 25 - 49%) Walk 50 feet with 2 turns activity did not occur: Safety/medical concerns Walk 150 feet activity did not occur: Safety/medical concerns Walk 10 feet on uneven surfaces activity did not occur: Safety/medical concerns  Function - Comprehension Comprehension: Auditory Comprehension assist level: Follows basic conversation/direction with no assist  Function - Expression Expression: Nonverbal Expression assistive device: Communication board Expression assist level: Expresses basic 50 - 74% of the time/requires cueing 25 - 49% of the time. Needs to repeat parts of sentences.  Function - Social Interaction Social Interaction assist level: Interacts appropriately 90% of the time - Needs monitoring or encouragement for participation or interaction.  Function - Problem Solving Problem solving assist level: Solves complex 90% of the time/cues < 10% of the time  Function - Memory Memory assist level: Recognizes or recalls 90% of the time/requires cueing < 10% of the time Patient normally able to recall (first 3 days  only): Staff names and faces, That he or she is in a hospital   Medical Problem List and Plan: 1.  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  DVT Prophylaxis/Anticoagulation: Subcutaneous Lovenox. Venous Doppler studies  negative,3. Pain Management: Neurontin 300 mg daily at bedtime 4. Mood: Ego support 5. Neuropsych: This patient is capable of making decisions on his own behalf. 6. Skin/Wound Care:  Routine skin checks 7. Fluids/Electrolytes/Nutrition:  Routine I&O with follow-up chemistries 8. Dysphagia. Dysphagia #1 honey liquids. Monitor hydration. Follow-up speech therapy 9.Hypertension. Lisinopril 10mg  daily increased on 10/6 - elevated systolic 03/70 Increase to 20mg  on 10/12, labile BP , HR variable as well no med changes, asymptomatic, add amlodipine Vitals:   11/09/16 2200 11/10/16 0526  BP: (!) 166/88 (!) 155/93  Pulse: 65 79  Resp:  18  Temp:  98.4 F (36.9 C)  SpO2: 100% 99%   10. Diabetes mellitus peripheral neuropathy. Hemoglobin A1c 8.9. SSI.Check blood sugars before meals and at bedtime. Patient on Glucotrol 5 mg twice a day, Glucophage 1000 mg twice a day. Resume as needed CBG (last 3)   Recent Labs  11/09/16 1152 11/09/16 1641 11/09/16 2048  GLUCAP 125* 162* 132*  faircontrol 10/15- no current diabetic meds, creeping up resume glucotrol 11.Polysubstance abuse. UDS positive for cocaine. Provide counseling with Neuropsych 12.History polycythemia. Follow-up labs 13. ?PBA: Monitor.  Will consider Neuropsych, improved , on sertraline 14.  Buccal surface laceration likely related to dyscoordinated oral movements, left side over salivary duct will schedule MMW 15.  Hypoalbuminemia- start prostat 16.  Pt c/o difficulty holding eyes open , no ptosis or EOM weakness, severe dysarthria and R hemiparesis obscures neuro exam, check ACH-R antibody 17.  Exopthalmos- normal TSH  T4 results, LOS (Days) 14 A FACE TO FACE EVALUATION WAS PERFORMED  Bernerd Terhune E 11/10/2016, 8:01 AM

## 2016-11-10 NOTE — Progress Notes (Signed)
Speech Language Pathology Daily Session Note  Patient Details  Name: SERENITY FORTNER MRN: [id number] Date of Birth: 1965/09/04  Today's Date: 11/10/2016 SLP Individual Time: 1130-1200 SLP Individual Time Calculation (min): 30 min  Short Term Goals: Week 2: SLP Short Term Goal 1 (Week 2): Pt will consume current diet with Min s/s asiration with supervision verbal cues for use of swallowing compensatory strategies. SLP Short Term Goal 2 (Week 2): Pt will tolerate trials of nectar thick liquids with use of compensatory strategies with Min s/s aspiration over 3 observed sessions to demonstrate readiness for diet advancement. SLP Short Term Goal 3 (Week 2): Pt will tolerate trials of Dys 2 with use of compensatory strategies with Min s/s aspiration and oral residue over 3 observed sessions to demonstrate readiness for diet advancement. SLP Short Term Goal 4 (Week 2): Pt will demonstrate effective use of communication board to express wants/needs at word level with mod I  SLP Short Term Goal 5 (Week 2): Pt will demonstrate functional problem solving for basic and familiar tasks with Supervision verbal cues. SLP Short Term Goal 6 (Week 2): Pt will utilize speech intelligibility strategies at word level with Max A verbal cues to acheive 50% intelligibility.   Skilled Therapeutic Interventions: Skilled treatment session focused on dysphagia and communication goals. SLP facilitated session by providing skilled supervision of pt consuming dysphagia 2 lunch tray with honey thick liquids via spoon. Pt required more than a reasonable amount of time to fully masticate dysphagia 2 meats but was effectively with oral clearly and appeared safe throughout with only 1 instance of residue in midline of tongue. Recommend upgrading to dysphagia 2 and instrumental swallow study to be perform for possibility of liquid advancement on 11/11/16. Pt required Max A pt was not intelligible at the word level. Pt able to supplement  some information with gestures. Pt was returned to room, left upright in wheelchair with all needs within reach. Continue per current plan of care.      Function:  Eating Eating   Modified Consistency Diet: Yes Eating Assist Level: Supervision or verbal cues;Set up assist for   Eating Set Up Assist For: Opening containers       Cognition Comprehension Comprehension assist level: Follows basic conversation/direction with no assist  Expression Expression assistive device: Communication board Expression assist level: Expresses basic 50 - 74% of the time/requires cueing 25 - 49% of the time. Needs to repeat parts of sentences.  Social Interaction Social Interaction assist level: Interacts appropriately 90% of the time - Needs monitoring or encouragement for participation or interaction.  Problem Solving Problem solving assist level: Solves basic 90% of the time/requires cueing < 10% of the time;Solves basic problems with no assist  Memory Memory assist level: Recognizes or recalls 90% of the time/requires cueing < 10% of the time    Pain    Therapy/Group: Individual Therapy  Robel Wuertz 11/10/2016, 2:14 PM

## 2016-11-11 ENCOUNTER — Inpatient Hospital Stay (HOSPITAL_COMMUNITY): Payer: No Typology Code available for payment source | Admitting: Physical Therapy

## 2016-11-11 ENCOUNTER — Inpatient Hospital Stay (HOSPITAL_COMMUNITY): Payer: No Typology Code available for payment source | Admitting: Occupational Therapy

## 2016-11-11 ENCOUNTER — Encounter (HOSPITAL_COMMUNITY): Payer: No Typology Code available for payment source | Admitting: Speech Pathology

## 2016-11-11 ENCOUNTER — Inpatient Hospital Stay (HOSPITAL_COMMUNITY): Payer: Medicaid Other

## 2016-11-11 DIAGNOSIS — I639 Cerebral infarction, unspecified: Secondary | ICD-10-CM

## 2016-11-11 LAB — GLUCOSE, CAPILLARY
GLUCOSE-CAPILLARY: 92 mg/dL (ref 65–99)
GLUCOSE-CAPILLARY: 79 mg/dL (ref 65–99)
GLUCOSE-CAPILLARY: 116 mg/dL — AB (ref 65–99)
Glucose-Capillary: 167 mg/dL — ABNORMAL HIGH (ref 65–99)

## 2016-11-11 NOTE — Progress Notes (Signed)
Physical Therapy Session Note  Patient Details  Name: Eric Morton MRN: [id number] Date of Birth: 05-10-1965  Today's Date: 11/11/2016 PT Individual Time: 0800-0900 and 1420-1500 PT Individual Time Calculation (min): 60 min 9mn  Short Term Goals: Week 2:  PT Short Term Goal 1 (Week 2): Pt will transfer R and L with min assist consistently  PT Short Term Goal 2 (Week 2): Pt will ambulate 330fwith mod assist and LRAd  PT Short Term Goal 3 (Week 2): Pt will perform bed mobility with min assist   Skilled Therapeutic Interventions/Progress Updates: Tx1:  Pt presented in bed agreeable to therapy. Pt performed supine to sit minA with use of features. Performed stand pivot to R with use of HW PTA providing mod cues for sequencing and facilitation of R quad for wt shift. Pt propelled to rehab gym supervision. Performed sit to/from stand with HW x 5 for forced use of RLE, pt required minA consistently with use of mirror for visual feedback. Performed wt shifting to R with pt demonstrating increased glute activation maintaining erect posture. Performed sitting balance activities including ball toss with pt having x 2 LOB laterally with moderate challenges requiring total assist for recovery. Performed squat pivot transfer to return to w/c and returned to room in same manner as prior. Pt remained in w/c with call bell within reach and needs met.   Tx2: Pt presented in w/c agreeable to therapy. Session focused on RLE therex with forced use NMR. Pt transported to rehab gym for energy conservation and time management. Performed squat pivot to L minA. Performed sit to supine minA with PTA providing cues for using LLE to assist RLE onto mat. Performed hip abd and heel slides in supine x 15 on RLE. Performed NMES SAQ R quad x 10 min 5 sec on 10 sec off. Pt transported back to room and remained in w/c at end of session with needs me.t      Therapy Documentation Precautions:  Precautions Precautions:  Fall Restrictions Weight Bearing Restrictions: No General:   Vital Signs:    See Function Navigator for Current Functional Status.   Therapy/Group: Individual Therapy  Elisabeth Strom  Kayla Deshaies, PTA  11/11/2016, 3:42 PM

## 2016-11-11 NOTE — Progress Notes (Signed)
Subjective/Complaints:  No issues overnite working with therapy   ROS- not able to obtain full review due to communication issues, Objective: Vital Signs: Blood pressure (!) 146/77, pulse 87, temperature 98.2 F (36.8 C), temperature source Oral, resp. rate 16, height '5\' 7"'  (1.702 m), weight 74.8 kg (165 lb), SpO2 98 %. No results found. Results for orders placed or performed during the hospital encounter of 10/27/16 (from the past 72 hour(s))  Glucose, capillary     Status: Abnormal   Collection Time: 11/08/16 11:52 AM  Result Value Ref Range   Glucose-Capillary 139 (H) 65 - 99 mg/dL  Glucose, capillary     Status: Abnormal   Collection Time: 11/08/16  4:34 PM  Result Value Ref Range   Glucose-Capillary 124 (H) 65 - 99 mg/dL  Glucose, capillary     Status: Abnormal   Collection Time: 11/08/16  9:46 PM  Result Value Ref Range   Glucose-Capillary 134 (H) 65 - 99 mg/dL   Comment 1 Notify RN   Glucose, capillary     Status: Abnormal   Collection Time: 11/09/16 12:59 AM  Result Value Ref Range   Glucose-Capillary 112 (H) 65 - 99 mg/dL   Comment 1 Notify RN   Glucose, capillary     Status: None   Collection Time: 11/09/16  6:41 AM  Result Value Ref Range   Glucose-Capillary 96 65 - 99 mg/dL   Comment 1 Notify RN   Glucose, capillary     Status: Abnormal   Collection Time: 11/09/16 11:52 AM  Result Value Ref Range   Glucose-Capillary 125 (H) 65 - 99 mg/dL  Glucose, capillary     Status: Abnormal   Collection Time: 11/09/16  4:41 PM  Result Value Ref Range   Glucose-Capillary 162 (H) 65 - 99 mg/dL  Glucose, capillary     Status: Abnormal   Collection Time: 11/09/16  8:48 PM  Result Value Ref Range   Glucose-Capillary 132 (H) 65 - 99 mg/dL  Creatinine, serum     Status: None   Collection Time: 11/10/16  6:31 AM  Result Value Ref Range   Creatinine, Ser 0.97 0.61 - 1.24 mg/dL   GFR calc non Af Amer >60 >60 mL/min   GFR calc Af Amer >60 >60 mL/min    Comment: (NOTE) The  eGFR has been calculated using the CKD EPI equation. This calculation has not been validated in all clinical situations. eGFR's persistently <60 mL/min signify possible Chronic Kidney Disease.   Glucose, capillary     Status: None   Collection Time: 11/10/16  6:33 AM  Result Value Ref Range   Glucose-Capillary 95 65 - 99 mg/dL   Comment 1 Notify RN   Glucose, capillary     Status: Abnormal   Collection Time: 11/10/16 12:09 PM  Result Value Ref Range   Glucose-Capillary 174 (H) 65 - 99 mg/dL  Glucose, capillary     Status: Abnormal   Collection Time: 11/10/16  4:29 PM  Result Value Ref Range   Glucose-Capillary 107 (H) 65 - 99 mg/dL   Comment 1 Notify RN   Glucose, capillary     Status: Abnormal   Collection Time: 11/10/16  8:48 PM  Result Value Ref Range   Glucose-Capillary 172 (H) 65 - 99 mg/dL   Comment 1 Notify RN   Glucose, capillary     Status: None   Collection Time: 11/11/16  6:45 AM  Result Value Ref Range   Glucose-Capillary 92 65 - 99 mg/dL  HEENT:poor dentition, exopthalmos, no ptosis EOMI Cardio: RRR and no murmur Resp: CTA B/L and unlabored GI: BS positive and NT, ND Extremity:  No Edema Skin:   Intact Neuro: Abnormal Sensory difficult to assess due to dysarthria, Abnormal Motor 0/5 RUE trace elbow flex, 2- Left knee extensor, Dysarthric and no emotional lability- Musc/Skel:  Other mild tenderness to palpation over lumbar paraspinals Gen NAD   Assessment/Plan: 1. Functional deficits secondary to Right hemiparesis , dysarthria, dysphagia which require 3+ hours per day of interdisciplinary therapy in a comprehensive inpatient rehab setting. Physiatrist is providing close team supervision and 24 hour management of active medical problems listed below. Physiatrist and rehab team continue to assess barriers to discharge/monitor patient progress toward functional and medical goals. FIM: Function - Bathing Bathing activity did not occur: Refused Position:  Shower Body parts bathed by patient: Right arm, Chest, Abdomen, Front perineal area, Buttocks, Right upper leg, Left upper leg, Right lower leg, Left lower leg Body parts bathed by helper: Back, Left arm Assist Level: Touching or steadying assistance(Pt > 75%)  Function- Upper Body Dressing/Undressing What is the patient wearing?: Pull over shirt/dress Pull over shirt/dress - Perfomed by patient: Thread/unthread right sleeve, Put head through opening, Thread/unthread left sleeve, Pull shirt over trunk Pull over shirt/dress - Perfomed by helper: Pull shirt over trunk Assist Level: More than reasonable time Function - Lower Body Dressing/Undressing What is the patient wearing?: Underwear, Pants, Non-skid slipper socks Position: Wheelchair/chair at sink Underwear - Performed by patient: Thread/unthread right underwear leg, Thread/unthread left underwear leg, Pull underwear up/down Underwear - Performed by helper: Pull underwear up/down Pants- Performed by patient: Thread/unthread left pants leg, Thread/unthread right pants leg Pants- Performed by helper: Pull pants up/down Non-skid slipper socks- Performed by patient: Don/doff right sock, Don/doff left sock Non-skid slipper socks- Performed by helper: Don/doff left sock, Don/doff right sock Assist for footwear: Supervision/touching assist Assist for lower body dressing: Touching or steadying assistance (Pt > 75%)  Function - Toileting Toileting steps completed by patient: Adjust clothing prior to toileting, Performs perineal hygiene, Adjust clothing after toileting Toileting steps completed by helper: Adjust clothing prior to toileting, Performs perineal hygiene, Adjust clothing after toileting Toileting Assistive Devices: Other (comment) (steady) Assist level: Two helpers  Function - Air cabin crew transfer assistive device: Facilities manager lift: Stedy Assist level to toilet: Moderate assist (Pt 50 - 74%/lift or  lower) Assist level from toilet: Moderate assist (Pt 50 - 74%/lift or lower)  Function - Chair/bed transfer Chair/bed transfer method: Squat pivot Chair/bed transfer assist level: Moderate assist (Pt 50 - 74%/lift or lower) Chair/bed transfer assistive device: Armrests Chair/bed transfer details: Tactile cues for sequencing, Verbal cues for sequencing, Verbal cues for technique, Verbal cues for precautions/safety, Tactile cues for weight shifting  Function - Locomotion: Wheelchair Will patient use wheelchair at discharge?: Yes Type: Manual Max wheelchair distance: 153f  Assist Level: Supervision or verbal cues Assist Level: Supervision or verbal cues Wheel 150 feet activity did not occur: Safety/medical concerns Assist Level: Supervision or verbal cues Turns around,maneuvers to table,bed, and toilet,negotiates 3% grade,maneuvers on rugs and over doorsills: No Function - Locomotion: Ambulation Assistive device: Walker-hemi Max distance: 16f Assist level: Maximal assist (Pt 25 - 49%) Assist level: Maximal assist (Pt 25 - 49%) Walk 50 feet with 2 turns activity did not occur: Safety/medical concerns Walk 150 feet activity did not occur: Safety/medical concerns Walk 10 feet on uneven surfaces activity did not occur: Safety/medical concerns  Function - Comprehension Comprehension: Auditory  Comprehension assist level: Follows basic conversation/direction with no assist  Function - Expression Expression: Verbal Expression assistive device: Communication board Expression assist level: Expresses basic 50 - 74% of the time/requires cueing 25 - 49% of the time. Needs to repeat parts of sentences.  Function - Social Interaction Social Interaction assist level: Interacts appropriately 90% of the time - Needs monitoring or encouragement for participation or interaction.  Function - Problem Solving Problem solving assist level: Solves basic 90% of the time/requires cueing < 10% of the  time, Solves basic problems with no assist  Function - Memory Memory assist level: Recognizes or recalls 90% of the time/requires cueing < 10% of the time Patient normally able to recall (first 3 days only): Staff names and faces, That he or she is in a hospital   Medical Problem List and Plan: 1.  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  DVT Prophylaxis/Anticoagulation: Subcutaneous Lovenox. Venous Doppler studies negative,3. Pain Management: Neurontin 300 mg daily at bedtime 4. Mood: Ego support 5. Neuropsych: This patient is capable of making decisions on his own behalf. 6. Skin/Wound Care:  Routine skin checks 7. Fluids/Electrolytes/Nutrition:  Routine I&O with follow-up chemistries 8. Dysphagia. Dysphagia #1 honey liquids. Monitor hydration. Follow-up speech therapy 9.Hypertension. Lisinopril 64m daily increased on 10/6 - elevated systolic 109/32Increase to 267mon 10/12, labile BP , HR variable as well no med changes, asymptomatic, added amlodipine 2.63m92m0/15 Vitals:   11/10/16 2159 11/11/16 0408  BP: (!) 166/89 (!) 146/77  Pulse: 91 87  Resp:  16  Temp:  98.2 F (36.8 C)  SpO2: 100% 98%   10. Diabetes mellitus peripheral neuropathy. Hemoglobin A1c 8.9. SSI.Check blood sugars before meals and at bedtime. Patient on Glucotrol 5 mg twice a day, Glucophage 1000 mg twice a day. Resume as needed CBG (last 3)   Recent Labs  11/10/16 1629 11/10/16 2048 11/11/16 0645  GLUCAP 107* 172* 92  fair control 10/15- no current diabetic meds, creeping up resume glucotrol 11.Polysubstance abuse. UDS positive for cocaine. Provide counseling with Neuropsych 12.History polycythemia. Follow-up labs 13. ?PBA: Monitor.  Will consider Neuropsych, improved , on sertraline 14.  Buccal surface laceration likely  related to dyscoordinated oral movements, left side over salivary duct will schedule MMW 15.  Hypoalbuminemia- start prostat 16.  Pt c/o difficulty holding eyes open , no ptosis or EOM weakness, severe dysarthria and R hemiparesis obscures neuro exam, check ACH-R antibody 17.  Exopthalmos- normal TSH  T4 results, LOS (Days) 15 A FACE TO FACE EVALUATION WAS PERFORMED  Eric Morton 11/11/2016, 8:13 AM

## 2016-11-11 NOTE — Progress Notes (Signed)
Nutrition Brief Note  Patient identified on the Malnutrition Screening Tool (MST) Report  Wt Readings from Last 15 Encounters:  10/27/16 165 lb (74.8 kg)  10/23/16 163 lb 4.8 oz (74.1 kg)  09/23/16 179 lb (81.2 kg)  06/06/16 176 lb (79.8 kg)  11/01/15 175 lb 11.2 oz (79.7 kg)  07/27/15 174 lb 9.6 oz (79.2 kg)  07/12/15 175 lb 3.2 oz (79.5 kg)  04/13/15 172 lb 14.4 oz (78.4 kg)  04/02/15 169 lb (76.7 kg)  03/21/15 166 lb 8 oz (75.5 kg)  05/04/14 160 lb (72.6 kg)  04/27/14 159 lb 1.6 oz (72.2 kg)  04/13/14 160 lb (72.6 kg)  03/30/14 160 lb 9 oz (72.8 kg)  10/07/11 182 lb 3.2 oz (82.6 kg)    Body mass index is 25.84 kg/m. Patient meets criteria for overweight based on current BMI.   Current diet order is dysphagia 2 with thin liquids, patient is consuming approximately 100% of meals at this time. Pt reports eating well currently and PTA with no other difficulties. Pt with no observed significant fat or muscle mass loss. Labs and medications reviewed.   No nutrition interventions warranted at this time. If nutrition issues arise, please consult RD.   Corrin Parker, MS, RD, LDN Pager # [phone] After hours/ weekend pager # [phone]

## 2016-11-11 NOTE — Progress Notes (Signed)
Modified Barium Swallow Progress Note  Patient Details  Name: Eric Morton MRN: [id number] Date of Birth: 05-Apr-1965  Today's Date: 11/11/2016  Modified Barium Swallow completed.  Full report located under Chart Review in the Imaging Section.  Brief recommendations include the following:  Clinical Impression   Pt presents with improvements in airway protection and oral control of boluses in comparison to previous evaluation.  Pt had intermittent penetration with all consistencies assessed but only x1 instance of silent aspiration with initial bolus of nectar thick liquids.  Pt was able to clear materials from the laryngeal vestibule with min cues for chin tuck and multiple effortful swallows. As a result, recommend advancing pt to thin liquids with continued use of dys 2 solids.  Pt will need full supervision for use of the following swallow precautions: chin tuck with all liquids, multiple, effortful swallows, and intermittent throat clears.  Prognosis for further advancement is good with ongoing ST interventions for management of safe diet progression such as pharyngeal strengthening exercises, reinforced use of swallow precautions, and therapeutic trials of advanced textures.   Swallow Evaluation Recommendations       SLP Diet Recommendations: Dysphagia 2 (Fine chop) solids;Thin liquid   Liquid Administration via: Cup   Medication Administration: Crushed with puree   Supervision: Patient able to self feed;Full supervision/cueing for compensatory strategies   Compensations: Minimize environmental distractions;Slow rate;Small sips/bites;Lingual sweep for clearance of pocketing;Multiple dry swallows after each bite/sip;Chin tuck   Postural Changes: Seated upright at 90 degrees            Eric Morton, Elmyra Ricks L 11/11/2016,1:21 PM

## 2016-11-11 NOTE — Progress Notes (Signed)
Occupational Therapy Weekly Progress Note  Patient Details  Name: Eric Morton MRN: [id number] Date of Birth: 27-Jan-1966  Beginning of progress report period: November 04, 2016 End of progress report period: November 11, 2016  Today's Date: 11/11/2016 OT Individual Time: 1100-1200 OT Individual Time Calculation (min): 60 min    Patient has met 2 of 3 short term goals. Pt can be inconsistent in the amount of assist he requires for dynamic standing balance during functional tasks, ranging from min-max A. At times for safety, focus on pt maintaining static standing balance at supervision-min A while caregiver assists with clothing management.  Pt is making steady progress towards OT goals. He has improved functional standing balance which is allowing for reduced caregiver burden and increasing independence with standing dressing and toileting tasks.   Patient continues to demonstrate the following deficits: abnormal posture, ataxia, cognitive deficits, flaccid hemiplegia and hemiparesis and muscle weakness (generalized) and therefore will continue to benefit from skilled OT intervention to enhance overall performance with BADL and Reduce care partner burden.  Patient progressing toward long term goals..  Continue plan of care.  OT Short Term Goals Week 2:  OT Short Term Goal 1 (Week 2): Pt will complete 1/3 toileting tasks with min steadying assist OT Short Term Goal 1 - Progress (Week 2): Progressing toward goal OT Short Term Goal 2 (Week 2): Pt will complete 1 grooming task in standing with steadying assist to increase functional standing tolerance/balance OT Short Term Goal 2 - Progress (Week 2): Met OT Short Term Goal 3 (Week 2): Pt will consistently complete toilet transfers with min A OT Short Term Goal 3 - Progress (Week 2): Met Week 3:  OT Short Term Goal 1 (Week 3): Pt will thread B LEs into pants with supervision OT Short Term Goal 2 (Week 3): Pt will complete 2/3 toileting tasks  with mod A standing balance OT Short Term Goal 3 (Week 3): Pt will complete squat pivot transfer to toilet with close supervision OT Short Term Goal 4 (Week 3): Pt will use R UE with use of bathmit during bathing task with mod A  Skilled Therapeutic Interventions/Progress Updates:    Pt seen for OT session focusing on functional transfers and toileting tasks as well an neuro re-ed with R UE/LE. Pt sitting up in w/c upon arrival, agreeable to tx session.  Practiced squat pivot transfers w/c <> toilet with use of grab bar and overall min A with VCs for technique and hand placement. Completed standing clothing management with pt standing at hemi walker for support. Due to impaired standing balance/ R knee instability pt unable to maintain functional standing balance while managing clothes down. Better performance with pulling pants up and min-mod steadying assist provided for balance and blocking of R knee.  He then completed hand hygiene standing at sink and with R knee blocked, pt able to maintain static standing to wash B hands with VCs for attention to R hand. He sat in w/c to complete oral care. He self propelled w/c to therapy gym with supervision using L LE. Completed standing weight-shifting activity at high/low table, standing with R knee blocked and R UE placed in weightbearing position, pt required to reach to R to place cups. Overall min-mod A for dynamic standing balance, however, several trials requiring max A to prevent total LOB to R. Pt returned to room at end of session, left seated in w/c with all needs in reach.   Therapy Documentation Precautions:  Precautions Precautions:  Fall Restrictions Weight Bearing Restrictions: No Pain:   No/ denies pain    See Function Navigator for Current Functional Status.   Therapy/Group: Individual Therapy  Lewis, Aniqua Briere C 11/11/2016, 7:12 AM

## 2016-11-12 ENCOUNTER — Inpatient Hospital Stay (HOSPITAL_COMMUNITY): Payer: Medicaid Other | Admitting: Speech Pathology

## 2016-11-12 ENCOUNTER — Inpatient Hospital Stay (HOSPITAL_COMMUNITY): Payer: No Typology Code available for payment source | Admitting: Physical Therapy

## 2016-11-12 ENCOUNTER — Inpatient Hospital Stay (HOSPITAL_COMMUNITY): Payer: No Typology Code available for payment source | Admitting: Occupational Therapy

## 2016-11-12 DIAGNOSIS — I6349 Cerebral infarction due to embolism of other cerebral artery: Secondary | ICD-10-CM

## 2016-11-12 LAB — GLUCOSE, CAPILLARY
GLUCOSE-CAPILLARY: 189 mg/dL — AB (ref 65–99)
Glucose-Capillary: 97 mg/dL (ref 65–99)
Glucose-Capillary: 112 mg/dL — ABNORMAL HIGH (ref 65–99)
Glucose-Capillary: 193 mg/dL — ABNORMAL HIGH (ref 65–99)

## 2016-11-12 LAB — ACETYLCHOLINE RECEPTOR AB, ALL
ACETYLCHOL BLOCK AB: 16 % (ref 0–25)
Acety choline binding ab: <0.03 nmol/L (ref 0.00–0.24)

## 2016-11-12 NOTE — Progress Notes (Signed)
Speech Language Pathology Daily Session Note  Patient Details  Name: Eric Morton MRN: [id number] Date of Birth: [birth year]  Today's Date: 11/12/2016 SLP Individual Time: 0815-0900 SLP Individual Time Calculation (min): 45 min  Short Term Goals: Week 2: SLP Short Term Goal 1 (Week 2): Pt will consume current diet with Min s/s asiration with supervision verbal cues for use of swallowing compensatory strategies. SLP Short Term Goal 2 (Week 2): Pt will tolerate trials of nectar thick liquids with use of compensatory strategies with Min s/s aspiration over 3 observed sessions to demonstrate readiness for diet advancement. SLP Short Term Goal 3 (Week 2): Pt will tolerate trials of Dys 2 with use of compensatory strategies with Min s/s aspiration and oral residue over 3 observed sessions to demonstrate readiness for diet advancement. SLP Short Term Goal 4 (Week 2): Pt will demonstrate effective use of communication board to express wants/needs at word level with mod I  SLP Short Term Goal 5 (Week 2): Pt will demonstrate functional problem solving for basic and familiar tasks with Supervision verbal cues. SLP Short Term Goal 6 (Week 2): Pt will utilize speech intelligibility strategies at word level with Max A verbal cues to acheive 50% intelligibility.   Skilled Therapeutic Interventions: Skilled treatment session focused on dysphagia and cognition goals. SLP facilitated session by providing skilled observation of pt consuming dysphagia 2 breakfast tray with thin liquids. Nursing also administered medicine whole with applesauce during session. Pt with more than a reasonable amount of time to effectively clear oral residue but he is functional and aware of residue. Dysphagia 2 doesn't appear to increase choking risk. Pt also consumed thin liquids via cup without overt s/s of aspiration and Mod A faded to Min A for use of chin tuck. Pt also required Mod A education regarding current diet and no need to use  spoon with liquids. Pt was handed off to nursing to finish breakfast. Continue per current plan of care.      Function:  Eating Eating   Modified Consistency Diet: Yes Eating Assist Level: Set up assist for;More than reasonable amount of time;Supervision or verbal cues   Eating Set Up Assist For: Opening containers       Cognition Comprehension Comprehension assist level: Follows basic conversation/direction with no assist  Expression Expression assistive device: Communication board Expression assist level: Expresses basic 75 - 89% of the time/requires cueing 10 - 24% of the time. Needs helper to occlude trach/needs to repeat words.  Social Interaction Social Interaction assist level: Interacts appropriately 90% of the time - Needs monitoring or encouragement for participation or interaction.  Problem Solving Problem solving assist level: Solves basic 90% of the time/requires cueing < 10% of the time;Solves basic problems with no assist  Memory Memory assist level: Recognizes or recalls 90% of the time/requires cueing < 10% of the time    Pain    Therapy/Group: Individual Therapy  Galen Malkowski 11/12/2016, 9:51 AM

## 2016-11-12 NOTE — Patient Care Conference (Signed)
Inpatient RehabilitationTeam Conference and Plan of Care Update Date: 11/12/2016   Time: 10:50 AM    Patient Name: Eric Morton      Medical Record Number: [id number]  Date of Birth: Dec 22, 1965 Sex: Male         Room/Bed: 4W24C/4W24C-01 Payor Info: Payor: MEDICAID POTENTIAL / Plan: MEDICAID POTENTIAL / Product Type: *No Product type* /    Admitting Diagnosis: L CVA  Admit Date/Time:  10/27/2016  3:38 PM Admission Comments: No comment available   Primary Diagnosis:  Hemiparesis affecting right side as late effect of stroke (HCC) Principal Problem: Hemiparesis affecting right side as late effect of stroke [facility])  Patient Active Problem List   Diagnosis Date Noted  . Hemiparesis affecting right side as late effect of stroke ([place]) 10/29/2016  . 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 Alcohol use   . Cocaine use   . Dysarthria due to recent stroke   . Dysphagia due to recent stroke   . Left-sided weakness 10/23/2016  . Cerebral thrombosis with cerebral infarction 10/23/2016  . Aphasia   . Hyperlipidemia 07/27/2015  . Cerebrovascular accident (CVA) due to occlusion of cerebral artery ([place])   . Stroke ([place]) 03/21/2015  . Polycythemia vera ([place]) 03/21/2015  . Hx of adenomatous colonic polyps   . Dehydration   . Diarrhea   . Protein-calorie malnutrition ([place])   . Cellulitis of breast of male 09/25/2011  . HTN (hypertension) 09/25/2011  . DM (diabetes mellitus) ([place]) 09/25/2011  . Leukopenia 09/25/2011  . Alcohol abuse 09/25/2011  . Cocaine abuse ([place]) 09/25/2011  . Tobacco use 09/25/2011  . Neuropathy 09/25/2011    Expected Discharge Date: Expected Discharge Date:  (SNF)  Team Members Present: Physician leading conference: Dr. Alysia Penna Social Worker Present: Ovidio Kin, LCSW Nurse Present: Junius Creamer, RN PT Present: Phylliss Bob, PTA;Barrie Folk, PT OT Present: Napoleon Form, OT SLP Present: Charolett Bumpers, SLP PPS Coordinator present : Daiva Nakayama, RN,  CRRN     Current Status/Progress Goal Weekly Team Focus  Medical   Severe dysarthria, swallowing, slowly improving  Reduce aspiration and fall risk  Discontinue IV fluids see if hydration can be maintained   Bowel/Bladder   Continent of bladder/Bowel  Maintain continence,   Assess toileting QS and prn, prn laxative Q 2-3 days if no BM   Swallow/Nutrition/ Hydration   Thin with chin tuck, multiple effortful swallows and intermittment throat clear   min assist   utilizing swallow strategies    ADL's   Min-mod A functional transfers and standing balance; min-mod A LB dressing/ bathing  Min A Overall  Standing balance, functional transfers, neuro re-ed, ADL re-training   Mobility   minA squat pivot, modA stand pivot with HW, modA gait 46ft with HW,   Min-mod assist with gait for short distances. supervision-min assist transfers with LRAD.   standing balance, gait, R NMR,    Communication   Max assist for verbal expression, supervision for use of communciation board   min assist   continue to address speech intelligibility at CV syllable level, Mod I communication board   Safety/Cognition/ Behavioral Observations  Min A  supervision  basic problem solving    Pain   Denies pain or discomfort  < 3   Assess QS and prn for pain or discomfort   Skin   No skin issues noted  Maintain skin integrity.  Assess skin QS and prn      *See Care Plan and progress notes for long and  short-term goals.     Barriers to Discharge  Current Status/Progress Possible Resolutions Date Resolved   Physician    Decreased caregiver support;Inaccessible home environment;Medical stability;Nutrition means     Progressing slowly towards goals  Continue rehabilitation, SNF, search      Nursing                  PT                    OT                  SLP                SW   NHP medicaid pending          Discharge Planning/Teaching Needs:  Plan now NHP, sister has applied for Medicaid and still to apply  for disability      Team Discussion:  Progressing in therapies-toward min-mod level. Diet upgraded to Dys 2 thin full supervision. DC IV fluids. Fatigues easily. Using communication board effectively. Cont B & B. Movement with UE-elbow now. Looking for NH bed  Revisions to Treatment Plan:  Downgraded gait goal to mod assist level. NHP now    Continued Need for Acute Rehabilitation Level of Care: The patient requires daily medical management by a physician with specialized training in physical medicine and rehabilitation for the following conditions: Daily direction of a multidisciplinary physical rehabilitation program to ensure safe treatment while eliciting the highest outcome that is of practical value to the patient.: Yes Daily medical management of patient stability for increased activity during participation in an intensive rehabilitation regime.: Yes Daily analysis of laboratory values and/or radiology reports with any subsequent need for medication adjustment of medical intervention for : Nutritional problems;Neurological problems  Elease Hashimoto 11/12/2016, 12:50 PM

## 2016-11-12 NOTE — Progress Notes (Signed)
Physical Therapy Session Note  Patient Details  Name: Eric Morton MRN: [id number] Date of Birth: [birth year]  Today's Date: 11/12/2016 PT Individual Time:[phone]   74 min   Short Term Goals: Week 1:  PT Short Term Goal 1 (Week 1): Pt will transfer with min assist from bed<>w/c PT Short Term Goal 1 - Progress (Week 1): Met PT Short Term Goal 2 (Week 1): Pt will ambulate x 50 ft with mod assist PT Short Term Goal 2 - Progress (Week 1): Progressing toward goal PT Short Term Goal 3 (Week 1): Pt will propel w/c 150 ft with min assist PT Short Term Goal 3 - Progress (Week 1): Met Week 2:  PT Short Term Goal 1 (Week 2): Pt will transfer R and L with min assist consistently  PT Short Term Goal 2 (Week 2): Pt will ambulate 36f with mod assist and LRAd  PT Short Term Goal 3 (Week 2): Pt will perform bed mobility with min assist   Skilled Therapeutic Interventions/Progress Updates:   Pt received sitting in WC and agreeable to PT  [facility]  PT instructed pt in Forced use of the RLE with sit<>stand x 5 with LUE on HW and cues for  improved WB over the RLE. Followed by mini squats 3x 8 with min assist from PT with multimodal cues for Increased ROM and R knee control noted with increased repetitions. One instance of LOB to the R requiring mod assist from PT.   PT instructed pt in gait training  With HW x 21 ft and 368fwith mod assist fading to max assist on second attempt due to R [place] decreased righting reactions. Max cues from PT for sequencing, gait pattern, and improved hip/knee extension on the R side. Pt noted to only have knee instability 25% of the time with gait training on this day.   Squat pivot transfer training with min-mod assist from PT for basic and car transfers. Max cues for proper set up for improved sequencing.   Nustep reciprocal movement training x 6 min with 1 rest  Break at 40m40mtes. BLE only with  min-mod assist from PT for improved hip control to prevent excessive ER in the RLE.   Patient returned to room and [facility] and all needs met.             Therapy Documentation Precautions:  Precautions Precautions: Fall Restrictions Weight Bearing Restrictions: No Pain: 0/10   See Function Navigator for Current Functional Status.   Therapy/Group: Individual Therapy  AusLorie Phenix/17/2018, 10:01 AM

## 2016-11-12 NOTE — Progress Notes (Signed)
Occupational Therapy Session Note  Patient Details  Name: Eric Morton MRN: [id number] Date of Birth: [birth year]  Today's Date: 11/12/2016 OT Individual Time: 1100-1200 OT Individual Time Calculation (min): 60 min    Short Term Goals: Week 3:  OT Short Term Goal 1 (Week 3): Pt will thread B LEs into pants with supervision OT Short Term Goal 2 (Week 3): Pt will complete 2/3 toileting tasks with mod A standing balance OT Short Term Goal 3 (Week 3): Pt will complete squat pivot transfer to toilet with close supervision OT Short Term Goal 4 (Week 3): Pt will use R UE with use of bathmit during bathing task with mod A  Skilled Therapeutic Interventions/Progress Updates:    Pt seen for OT ADL bathing/dressing session. Pt sitting up in w/c upon arrival, via communication board made aware he was fatigued from PT session, though agreeable to tx session and bathing at shower level.   He doffed clothing seated in w/c with supervision and gathered clothing items, pt demonstrating good safety awareness, locking brakes as appropriate for dynamic sitting tasks in chair.  He completed mod A squat pivot transfer to weaker R side to [facility] in shower. He bathed seated on BSC with supervision, assist for washing L UE and back. He  Returned to w/c to dress and with significantly increased time, pt able to don shirt independently. Trialed use of leg lifter and reacher to assist with LB dressing as pt with difficulty managing R LE into figure four position to thread pants. Unsuccessful with both pieces of AE following demonstration for use and trial of each. He stood to hemi walker and pt able to assist with pulling up pants while maintaining dynamic standing balance with overall min-mod A. Pt left seated in w/c at end of session, all needs in reach.   Therapy Documentation Precautions:  Precautions Precautions: Fall Restrictions Weight Bearing Restrictions: No Pain:   No/ denies pain  See Function Navigator  for Current Functional Status.   Therapy/Group: Individual Therapy  Lewis, Jorge Amparo C 11/12/2016, 7:15 AM

## 2016-11-12 NOTE — Progress Notes (Signed)
Physical Therapy Session Note  Patient Details  Name: AMELIO BROSKY MRN: [id number] Date of Birth: 27-Mar-1965  Today's Date: 11/12/2016 PT Individual Time: 1300-1330 PT Individual Time Calculation (min): 30 min   Short Term Goals: Week 2:  PT Short Term Goal 1 (Week 2): Pt will transfer R and L with min assist consistently  PT Short Term Goal 2 (Week 2): Pt will ambulate 96f with mod assist and LRAd  PT Short Term Goal 3 (Week 2): Pt will perform bed mobility with min assist   Skilled Therapeutic Interventions/Progress Updates: Pt presented in w/c agreeable to therapy. Pt propelled to BM[employer] Technologysupervision.Use of [employer] for standing balance and reaching/scanning. Performed Mode B in sitting 2 trials x 1 min with focus on reaching high and across midline to R. Performed x 3 trials in standing with emphasis on increasing wt shift to R and maintaining R quad control with activity. Pt intially min assit balance but became modA with fatigue. Pt returned to room via same method as prior and left with call bell within reach and needs met.      Therapy Documentation Precautions:  Precautions Precautions: Fall Restrictions Weight Bearing Restrictions: No General:   Vital Signs: Therapy Vitals Temp: 98.2 F (36.8 C) Temp Source: Oral Pulse Rate: 84 Resp: 18 BP: (!) 144/70 Patient Position (if appropriate): Sitting Oxygen Therapy SpO2: 100 % O2 Device: Not Delivered   See Function Navigator for Current Functional Status.   Therapy/Group: Individual Therapy  Montrell Cessna  Rhonna Holster, PTA  11/12/2016, 2:55 PM

## 2016-11-12 NOTE — Progress Notes (Signed)
Subjective/Complaints:  Working with PT in gym   ROS- not able to obtain full review due to communication issues, Objective: Vital Signs: Blood pressure (!) 161/98, pulse 74, temperature 97.7 F (36.5 C), temperature source Oral, resp. rate 17, height '5\' 7"'  (1.702 m), weight 74.8 kg (165 lb), SpO2 95 %. Dg Swallowing Func-speech Pathology  Result Date: 11/11/2016 Objective Swallowing Evaluation: Type of Study: MBS-Modified Barium Swallow Study Patient Details Name: Eric Morton MRN: [id number] Date of Birth: 25-Jan-1966 Today's Date: 11/11/2016 Past Medical History: Past Medical History: Diagnosis Date . Alcohol liver damage (HCC)   "just a little damage" . Anxiety  . Chronic kidney disease   "jsut a little damage" . Glaucoma  . History of stomach ulcers   not confirmed, never underwent diagnostic endo or radiology, this manifested as epigastric distress.  [name] Hx of adenomatous colonic polyps  . Hypercholesterolemia  . Hypertension  . Peripheral neuropathy  . Polycythemia vera ([place]) 2006 . Substance abuse ([place])  . Type II diabetes mellitus ([place])  Past Surgical History: Past Surgical History: Procedure Laterality Date . COLONOSCOPY WITH PROPOFOL N/A 04/28/2014  Procedure: COLONOSCOPY WITH PROPOFOL;  Surgeon: Gatha Mayer, MD;  Location: [facility];  Service: Endoscopy;  Laterality: N/A; . IRRIGATION AND DEBRIDEMENT ABSCESS  09/26/2011  Procedure: IRRIGATION AND DEBRIDEMENT ABSCESS;  Surgeon: Imogene Burn. Georgette Dover, MD;  Location: [place];  Service: General;  Laterality: Left;  Incision and Drainage left breast abscess HPI: Pt is a 51 y.o. male presented to ED 9/27 with R side weakness/ aphasia/ dysarthria. PMH is significant for CKD, HTN, HLD, polysubstance abuse, polycythemia vera, DM2. MRI showed Numerous acute supra- and infratentorial infarcts, largest measuring 18 x 7 mm LEFT basal ganglia. Distribution suggests embolic etiology; old L cerebellar infarcts. MBS recommended dysphagia 1/ honey thick liquids by  tsp only.  Repeat MBS ordered to determine readiness to advance.    No Data Recorded Assessment / Plan / Recommendation CHL IP CLINICAL IMPRESSIONS 11/11/2016 Clinical Impression Pt presents with improvements in airway protection and oral control of boluses in comparison to previous evaluation.  Pt had intermittent penetration with all consistencies assessed but only x1 instance of silent aspiration with initial bolus of nectar thick liquids.  Pt was able to clear materials from the laryngeal vestibule with min cues for chin tuck and multiple effortful swallows. As a result, recommend advancing pt to thin liquids with continued use of dys 2 solids.  Pt will need full supervision for use of the following swallow precautions: chin tuck with all liquids, multiple, effortful swallows, and intermittent throat clears.  Prognosis for further advancement is good with ongoing ST interventions for management of safe diet progression such as pharyngeal strengthening exercises, reinforced use of swallow precautions, and therapeutic trials of advanced textures.    SLP Visit Diagnosis Dysphagia, oropharyngeal phase (R13.12) Attention and concentration deficit following -- Frontal lobe and executive function deficit following -- Impact on safety and function Moderate aspiration risk   CHL IP TREATMENT RECOMMENDATION 10/24/2016 Treatment Recommendations Therapy as outlined in treatment plan below   Prognosis 10/24/2016 Prognosis for Safe Diet Advancement Good Barriers to Reach Goals Behavior Barriers/Prognosis Comment -- CHL IP DIET RECOMMENDATION 11/11/2016 SLP Diet Recommendations Dysphagia 2 (Fine chop) solids;Thin liquid Liquid Administration via Cup Medication Administration Crushed with puree Compensations Minimize environmental distractions;Slow rate;Small sips/bites;Lingual sweep for clearance of pocketing;Multiple dry swallows after each bite/sip;Chin tuck Postural Changes Seated upright at 90 degrees            CHL  IP ORAL  PHASE 11/11/2016 Oral Phase Impaired Oral - Pudding Teaspoon -- Oral - Pudding Cup -- Oral - Honey Teaspoon Weak lingual manipulation;Incomplete tongue to palate contact;Reduced posterior propulsion;Piecemeal swallowing;Lingual/palatal residue;Right anterior bolus loss;Delayed oral transit Oral - Honey Cup Weak lingual manipulation;Reduced posterior propulsion;Lingual/palatal residue;Piecemeal swallowing;Right anterior bolus loss;Delayed oral transit Oral - Nectar Teaspoon -- Oral - Nectar Cup Weak lingual manipulation;Incomplete tongue to palate contact;Right anterior bolus loss;Piecemeal swallowing;Lingual/palatal residue;Reduced posterior propulsion;Delayed oral transit Oral - Nectar Straw -- Oral - Thin Teaspoon -- Oral - Thin Cup Right anterior bolus loss;Incomplete tongue to palate contact;Weak lingual manipulation;Lingual/palatal residue;Reduced posterior propulsion Oral - Thin Straw Right anterior bolus loss;Weak lingual manipulation;Incomplete tongue to palate contact;Reduced posterior propulsion;Lingual/palatal residue;Piecemeal swallowing;Delayed oral transit Oral - Puree Weak lingual manipulation;Lingual/palatal residue;Delayed oral transit Oral - Mech Soft -- Oral - Regular -- Oral - Multi-Consistency -- Oral - Pill -- Oral Phase - Comment --  CHL IP PHARYNGEAL PHASE 11/11/2016 Pharyngeal Phase Impaired Pharyngeal- Pudding Teaspoon -- Pharyngeal -- Pharyngeal- Pudding Cup -- Pharyngeal -- Pharyngeal- Honey Teaspoon Delayed swallow initiation-vallecula;Reduced laryngeal elevation;Reduced tongue base retraction;Pharyngeal residue - valleculae;Pharyngeal residue - cp segment;Reduced airway/laryngeal closure Pharyngeal -- Pharyngeal- Honey Cup Delayed swallow initiation-vallecula;Pharyngeal residue - cp segment;Pharyngeal residue - posterior pharnyx;Pharyngeal residue - valleculae;Pharyngeal residue - pyriform;Reduced laryngeal elevation;Reduced airway/laryngeal closure;Penetration/Aspiration during  swallow;Reduced tongue base retraction Pharyngeal Material enters airway, remains ABOVE vocal cords then ejected out Pharyngeal- Nectar Teaspoon -- Pharyngeal -- Pharyngeal- Nectar Cup Delayed swallow initiation-vallecula;Reduced laryngeal elevation;Reduced airway/laryngeal closure;Reduced tongue base retraction;Pharyngeal residue - posterior pharnyx;Pharyngeal residue - cp segment;Pharyngeal residue - pyriform;Pharyngeal residue - valleculae;Penetration/Aspiration during swallow Pharyngeal Material enters airway, passes BELOW cords without attempt by patient to eject out (silent aspiration);Material enters airway, CONTACTS cords and then ejected out Pharyngeal- Nectar Straw -- Pharyngeal -- Pharyngeal- Thin Teaspoon -- Pharyngeal -- Pharyngeal- Thin Cup Delayed swallow initiation-pyriform sinuses;Reduced laryngeal elevation;Reduced airway/laryngeal closure;Reduced tongue base retraction;Penetration/Aspiration during swallow Pharyngeal Material enters airway, CONTACTS cords and then ejected out Pharyngeal- Thin Straw -- Pharyngeal -- Pharyngeal- Puree Reduced tongue base retraction;Reduced laryngeal elevation;Penetration/Aspiration during swallow;Pharyngeal residue - valleculae Pharyngeal Material enters airway, remains ABOVE vocal cords then ejected out Pharyngeal- Mechanical Soft -- Pharyngeal -- Pharyngeal- Regular -- Pharyngeal -- Pharyngeal- Multi-consistency -- Pharyngeal -- Pharyngeal- Pill -- Pharyngeal -- Pharyngeal Comment --  CHL IP CERVICAL ESOPHAGEAL PHASE 11/11/2016 Cervical Esophageal Phase WFL Pudding Teaspoon -- Pudding Cup -- Honey Teaspoon -- Honey Cup -- Nectar Teaspoon -- Nectar Cup -- Nectar Straw -- Thin Teaspoon -- Thin Cup -- Thin Straw -- Puree -- Mechanical Soft -- Regular -- Multi-consistency -- Pill -- Cervical Esophageal Comment -- No flowsheet data found. Page, Selinda Orion 11/11/2016, 1:13 PM              Results for orders placed or performed during the hospital encounter of 10/27/16  (from the past 72 hour(s))  Glucose, capillary     Status: Abnormal   Collection Time: 11/09/16 11:52 AM  Result Value Ref Range   Glucose-Capillary 125 (H) 65 - 99 mg/dL  Glucose, capillary     Status: Abnormal   Collection Time: 11/09/16  4:41 PM  Result Value Ref Range   Glucose-Capillary 162 (H) 65 - 99 mg/dL  Glucose, capillary     Status: Abnormal   Collection Time: 11/09/16  8:48 PM  Result Value Ref Range   Glucose-Capillary 132 (H) 65 - 99 mg/dL  Creatinine, serum     Status: None   Collection Time: 11/10/16  6:31 AM  Result  Value Ref Range   Creatinine, Ser 0.97 0.61 - 1.24 mg/dL   GFR calc non Af Amer >60 >60 mL/min   GFR calc Af Amer >60 >60 mL/min    Comment: (NOTE) The eGFR has been calculated using the CKD EPI equation. This calculation has not been validated in all clinical situations. eGFR's persistently <60 mL/min signify possible Chronic Kidney Disease.   Glucose, capillary     Status: None   Collection Time: 11/10/16  6:33 AM  Result Value Ref Range   Glucose-Capillary 95 65 - 99 mg/dL   Comment 1 Notify RN   Glucose, capillary     Status: Abnormal   Collection Time: 11/10/16 12:09 PM  Result Value Ref Range   Glucose-Capillary 174 (H) 65 - 99 mg/dL  Glucose, capillary     Status: Abnormal   Collection Time: 11/10/16  4:29 PM  Result Value Ref Range   Glucose-Capillary 107 (H) 65 - 99 mg/dL   Comment 1 Notify RN   Glucose, capillary     Status: Abnormal   Collection Time: 11/10/16  8:48 PM  Result Value Ref Range   Glucose-Capillary 172 (H) 65 - 99 mg/dL   Comment 1 Notify RN   Glucose, capillary     Status: None   Collection Time: 11/11/16  6:45 AM  Result Value Ref Range   Glucose-Capillary 92 65 - 99 mg/dL  Glucose, capillary     Status: None   Collection Time: 11/11/16 12:01 PM  Result Value Ref Range   Glucose-Capillary 79 65 - 99 mg/dL  Glucose, capillary     Status: Abnormal   Collection Time: 11/11/16  4:49 PM  Result Value Ref Range    Glucose-Capillary 116 (H) 65 - 99 mg/dL  Glucose, capillary     Status: Abnormal   Collection Time: 11/11/16  8:52 PM  Result Value Ref Range   Glucose-Capillary 167 (H) 65 - 99 mg/dL  Glucose, capillary     Status: None   Collection Time: 11/12/16  6:35 AM  Result Value Ref Range   Glucose-Capillary 97 65 - 99 mg/dL   Comment 1 Notify RN      HEENT:poor dentition, exopthalmos, no ptosis EOMI Cardio: RRR and no murmur Resp: CTA B/L and unlabored GI: BS positive and NT, ND Extremity:  No Edema Skin:   Intact Neuro: Abnormal Sensory difficult to assess due to dysarthria, Abnormal Motor 0/5 RUE trace elbow flex, 2- Left knee extensor, Dysarthric and no emotional lability- Musc/Skel:  Other mild tenderness to palpation over lumbar paraspinals Gen NAD   Assessment/Plan: 1. Functional deficits secondary to Right hemiparesis , dysarthria, dysphagia which require 3+ hours per day of interdisciplinary therapy in a comprehensive inpatient rehab setting. Physiatrist is providing close team supervision and 24 hour management of active medical problems listed below. Physiatrist and rehab team continue to assess barriers to discharge/monitor patient progress toward functional and medical goals. FIM: Function - Bathing Bathing activity did not occur: Refused Position: Shower Body parts bathed by patient: Right arm, Chest, Abdomen, Front perineal area, Buttocks, Right upper leg, Left upper leg, Right lower leg, Left lower leg Body parts bathed by helper: Back, Left arm Assist Level: Touching or steadying assistance(Pt > 75%)  Function- Upper Body Dressing/Undressing What is the patient wearing?: Pull over shirt/dress Pull over shirt/dress - Perfomed by patient: Thread/unthread right sleeve, Put head through opening, Thread/unthread left sleeve, Pull shirt over trunk Pull over shirt/dress - Perfomed by helper: Pull shirt over trunk Assist Level:  More than reasonable time Function - Lower  Body Dressing/Undressing What is the patient wearing?: Underwear, Pants, Non-skid slipper socks Position: Wheelchair/chair at sink Underwear - Performed by patient: Thread/unthread right underwear leg, Thread/unthread left underwear leg, Pull underwear up/down Underwear - Performed by helper: Pull underwear up/down Pants- Performed by patient: Thread/unthread left pants leg, Thread/unthread right pants leg Pants- Performed by helper: Pull pants up/down Non-skid slipper socks- Performed by patient: Don/doff right sock, Don/doff left sock Non-skid slipper socks- Performed by helper: Don/doff left sock, Don/doff right sock Assist for footwear: Supervision/touching assist Assist for lower body dressing: Touching or steadying assistance (Pt > 75%)  Function - Toileting Toileting steps completed by patient: Adjust clothing after toileting Toileting steps completed by helper: Adjust clothing prior to toileting, Performs perineal hygiene Toileting Assistive Devices: Other (comment) (steady) Assist level: Two helpers  Function - Air cabin crew transfer assistive device: Radio broadcast assistant lift: Stedy Assist level to toilet: Touching or steadying assistance (Pt > 75%) Assist level from toilet: Touching or steadying assistance (Pt > 75%)  Function - Chair/bed transfer Chair/bed transfer method: Stand pivot Chair/bed transfer assist level: Moderate assist (Pt 50 - 74%/lift or lower) Chair/bed transfer assistive device: Walker ([place]) Chair/bed transfer details: Tactile cues for sequencing, Verbal cues for sequencing, Verbal cues for technique, Verbal cues for precautions/safety, Tactile cues for weight shifting  Function - Locomotion: Wheelchair Will patient use wheelchair at discharge?: Yes Type: Manual Max wheelchair distance: 153f  Assist Level: Supervision or verbal cues Assist Level: Supervision or verbal cues Wheel 150 feet activity did not occur: Safety/medical concerns Assist  Level: Supervision or verbal cues Turns around,maneuvers to table,bed, and toilet,negotiates 3% grade,maneuvers on rugs and over doorsills: No Function - Locomotion: Ambulation Assistive device: Walker-hemi Max distance: 144f Assist level: Maximal assist (Pt 25 - 49%) Assist level: Maximal assist (Pt 25 - 49%) Walk 50 feet with 2 turns activity did not occur: Safety/medical concerns Walk 150 feet activity did not occur: Safety/medical concerns Walk 10 feet on uneven surfaces activity did not occur: Safety/medical concerns  Function - Comprehension Comprehension: Auditory Comprehension assist level: Follows basic conversation/direction with no assist  Function - Expression Expression: Verbal Expression assistive device: Communication board Expression assist level: Expresses basic 75 - 89% of the time/requires cueing 10 - 24% of the time. Needs helper to occlude trach/needs to repeat words.  Function - Social Interaction Social Interaction assist level: Interacts appropriately 90% of the time - Needs monitoring or encouragement for participation or interaction.  Function - Problem Solving Problem solving assist level: Solves basic 90% of the time/requires cueing < 10% of the time, Solves basic problems with no assist  Function - Memory Memory assist level: Recognizes or recalls 90% of the time/requires cueing < 10% of the time Patient normally able to recall (first 3 days only): Staff names and faces, That he or she is in a hospital   Medical Problem List and Plan: 1.  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 Formulized individual treatment plan based on medical history,  underlying problem and comorbidities. , 2.  DVT Prophylaxis/Anticoagulation: Subcutaneous Lovenox. Venous Doppler studies negative,3. Pain Management: Neurontin 300 mg daily at bedtime 4. Mood: Ego support 5. Neuropsych: This patient is capable of making decisions on  his own behalf. 6. Skin/Wound Care:  Routine skin checks 7. Fluids/Electrolytes/Nutrition:  Routine I&O with follow-up chemistries 8. Dysphagia. Dysphagia #1 honey liquids. Monitor hydration. Follow-up speech therapy 9.Hypertension. Lisinopril 59m daily increased on 10/6 - elevated systolic 118/34Increase to 260mon 10/12, labile BP , HR variable as well no med changes, asymptomatic, added amlodipine 2.60m29m0/15, increase to 60mg10m/17 Vitals:   11/11/16 2000 11/12/16 0500  BP: (!) 171/81 (!) 161/98  Pulse: 69 74  Resp: 18 17  Temp:  97.7 F (36.5 C)  SpO2: 100% 95%   10. Diabetes mellitus peripheral neuropathy. Hemoglobin A1c 8.9. SSI.Check blood sugars before meals and at bedtime. Patient on Glucotrol 5 mg twice a day, Glucophage 1000 mg twice a day. Resume as needed CBG (last 3)   Recent Labs  11/11/16 1649 11/11/16 2052 11/12/16 0635  GLUCAP 116* 167* 97  fair control 10/17- no current diabetic meds, creeping up resume glucotrol 11.Polysubstance abuse. UDS positive for cocaine. Provide counseling with Neuropsych 12.History polycythemia. Follow-up labs 13. ?PBA: Monitor.  Will consider Neuropsych, improved , on sertraline 14.  Buccal surface laceration likely related to dyscoordinated oral movements, left side over salivary duct will schedule MMW 15.  Hypoalbuminemia- start prostat 16.  Pt c/o difficulty holding eyes open , no ptosis or EOM weakness, severe dysarthria and R hemiparesis obscures neuro exam, check ACH-R antibody 17.  Exopthalmos- normal TSH  T4 results, LOS (Days) 16 A FACE TO FACE EVALUATION WAS PERFORMED  Anagha Loseke E 11/12/2016, 10:27 AM

## 2016-11-12 NOTE — Plan of Care (Signed)
Problem: RH Bed to Chair Transfers Goal: LTG Patient will perform bed/chair transfers w/assist (PT) LTG: Patient will perform bed/chair transfers with assistance, with/without cues (PT).  down graded due to lack of progress  Problem: RH Car Transfers Goal: LTG Patient will perform car transfers with assist (PT) LTG: Patient will perform car transfers with assistance (PT).  down graded due to lack of progress  Problem: RH Ambulation Goal: LTG Patient will ambulate in controlled environment (PT) LTG: Patient will ambulate in a controlled environment, # of feet with assistance (PT).  down graded due to lack of progress  Comments: Pt's goals down graded by PT due to slow progress, secondary to continued apraxia and dense hemiplegia.

## 2016-11-13 ENCOUNTER — Inpatient Hospital Stay (HOSPITAL_COMMUNITY): Payer: No Typology Code available for payment source | Admitting: Occupational Therapy

## 2016-11-13 ENCOUNTER — Inpatient Hospital Stay (HOSPITAL_COMMUNITY): Payer: Medicaid Other | Admitting: Speech Pathology

## 2016-11-13 ENCOUNTER — Inpatient Hospital Stay (HOSPITAL_COMMUNITY): Payer: No Typology Code available for payment source | Admitting: Physical Therapy

## 2016-11-13 LAB — GLUCOSE, CAPILLARY
GLUCOSE-CAPILLARY: 121 mg/dL — AB (ref 65–99)
Glucose-Capillary: 121 mg/dL — ABNORMAL HIGH (ref 65–99)
Glucose-Capillary: 102 mg/dL — ABNORMAL HIGH (ref 65–99)
Glucose-Capillary: 132 mg/dL — ABNORMAL HIGH (ref 65–99)

## 2016-11-13 LAB — BASIC METABOLIC PANEL
ANION GAP: 7 (ref 5–15)
BUN: 10 mg/dL (ref 6–20)
CO2: 23 mmol/L (ref 22–32)
Calcium: 9.1 mg/dL (ref 8.9–10.3)
Chloride: 111 mmol/L (ref 101–111)
Creatinine, Ser: 0.91 mg/dL (ref 0.61–1.24)
GFR calc Af Amer: >60 mL/min (ref >60)
GLUCOSE: 148 mg/dL — AB (ref 65–99)
POTASSIUM: 4 mmol/L (ref 3.5–5.1)
Sodium: 141 mmol/L (ref 135–145)

## 2016-11-13 NOTE — Progress Notes (Signed)
Subjective/Complaints:  RN notes loose stools, started on nectar liq yesterday but with poor intake  ROS- not able to obtain full review due to communication issues, Objective: Vital Signs: Blood pressure 138/73, pulse 63, temperature 98.4 F (36.9 C), temperature source Oral, resp. rate 17, height '5\' 7"'  (1.702 m), weight 74.8 kg (165 lb), SpO2 97 %. Dg Swallowing Func-speech Pathology  Result Date: 11/11/2016 Objective Swallowing Evaluation: Type of Study: MBS-Modified Barium Swallow Study Patient Details Name: Eric Morton MRN: [id number] Date of Birth: March 29, 1965 Today's Date: 11/11/2016 Past Medical History: Past Medical History: Diagnosis Date . Alcohol liver damage (HCC)   "just a little damage" . Anxiety  . Chronic kidney disease   "jsut a little damage" . Glaucoma  . History of stomach ulcers   not confirmed, never underwent diagnostic endo or radiology, this manifested as epigastric distress.  [name] Hx of adenomatous colonic polyps  . Hypercholesterolemia  . Hypertension  . Peripheral neuropathy  . Polycythemia vera ([place]) 2006 . Substance abuse ([place])  . Type II diabetes mellitus ([place])  Past Surgical History: Past Surgical History: Procedure Laterality Date . COLONOSCOPY WITH PROPOFOL N/A 04/28/2014  Procedure: COLONOSCOPY WITH PROPOFOL;  Surgeon: Gatha Mayer, MD;  Location: [facility];  Service: Endoscopy;  Laterality: N/A; . IRRIGATION AND DEBRIDEMENT ABSCESS  09/26/2011  Procedure: IRRIGATION AND DEBRIDEMENT ABSCESS;  Surgeon: Imogene Burn. Georgette Dover, MD;  Location: [place];  Service: General;  Laterality: Left;  Incision and Drainage left breast abscess HPI: Pt is a 51 y.o. male presented to ED 9/27 with R side weakness/ aphasia/ dysarthria. PMH is significant for CKD, HTN, HLD, polysubstance abuse, polycythemia vera, DM2. MRI showed Numerous acute supra- and infratentorial infarcts, largest measuring 18 x 7 mm LEFT basal ganglia. Distribution suggests embolic etiology; old L cerebellar infarcts. MBS  recommended dysphagia 1/ honey thick liquids by tsp only.  Repeat MBS ordered to determine readiness to advance.    No Data Recorded Assessment / Plan / Recommendation CHL IP CLINICAL IMPRESSIONS 11/11/2016 Clinical Impression Pt presents with improvements in airway protection and oral control of boluses in comparison to previous evaluation.  Pt had intermittent penetration with all consistencies assessed but only x1 instance of silent aspiration with initial bolus of nectar thick liquids.  Pt was able to clear materials from the laryngeal vestibule with min cues for chin tuck and multiple effortful swallows. As a result, recommend advancing pt to thin liquids with continued use of dys 2 solids.  Pt will need full supervision for use of the following swallow precautions: chin tuck with all liquids, multiple, effortful swallows, and intermittent throat clears.  Prognosis for further advancement is good with ongoing ST interventions for management of safe diet progression such as pharyngeal strengthening exercises, reinforced use of swallow precautions, and therapeutic trials of advanced textures.    SLP Visit Diagnosis Dysphagia, oropharyngeal phase (R13.12) Attention and concentration deficit following -- Frontal lobe and executive function deficit following -- Impact on safety and function Moderate aspiration risk   CHL IP TREATMENT RECOMMENDATION 10/24/2016 Treatment Recommendations Therapy as outlined in treatment plan below   Prognosis 10/24/2016 Prognosis for Safe Diet Advancement Good Barriers to Reach Goals Behavior Barriers/Prognosis Comment -- CHL IP DIET RECOMMENDATION 11/11/2016 SLP Diet Recommendations Dysphagia 2 (Fine chop) solids;Thin liquid Liquid Administration via Cup Medication Administration Crushed with puree Compensations Minimize environmental distractions;Slow rate;Small sips/bites;Lingual sweep for clearance of pocketing;Multiple dry swallows after each bite/sip;Chin tuck Postural Changes  Seated upright at 90 degrees  CHL IP ORAL PHASE 11/11/2016 Oral Phase Impaired Oral - Pudding Teaspoon -- Oral - Pudding Cup -- Oral - Honey Teaspoon Weak lingual manipulation;Incomplete tongue to palate contact;Reduced posterior propulsion;Piecemeal swallowing;Lingual/palatal residue;Right anterior bolus loss;Delayed oral transit Oral - Honey Cup Weak lingual manipulation;Reduced posterior propulsion;Lingual/palatal residue;Piecemeal swallowing;Right anterior bolus loss;Delayed oral transit Oral - Nectar Teaspoon -- Oral - Nectar Cup Weak lingual manipulation;Incomplete tongue to palate contact;Right anterior bolus loss;Piecemeal swallowing;Lingual/palatal residue;Reduced posterior propulsion;Delayed oral transit Oral - Nectar Straw -- Oral - Thin Teaspoon -- Oral - Thin Cup Right anterior bolus loss;Incomplete tongue to palate contact;Weak lingual manipulation;Lingual/palatal residue;Reduced posterior propulsion Oral - Thin Straw Right anterior bolus loss;Weak lingual manipulation;Incomplete tongue to palate contact;Reduced posterior propulsion;Lingual/palatal residue;Piecemeal swallowing;Delayed oral transit Oral - Puree Weak lingual manipulation;Lingual/palatal residue;Delayed oral transit Oral - Mech Soft -- Oral - Regular -- Oral - Multi-Consistency -- Oral - Pill -- Oral Phase - Comment --  CHL IP PHARYNGEAL PHASE 11/11/2016 Pharyngeal Phase Impaired Pharyngeal- Pudding Teaspoon -- Pharyngeal -- Pharyngeal- Pudding Cup -- Pharyngeal -- Pharyngeal- Honey Teaspoon Delayed swallow initiation-vallecula;Reduced laryngeal elevation;Reduced tongue base retraction;Pharyngeal residue - valleculae;Pharyngeal residue - cp segment;Reduced airway/laryngeal closure Pharyngeal -- Pharyngeal- Honey Cup Delayed swallow initiation-vallecula;Pharyngeal residue - cp segment;Pharyngeal residue - posterior pharnyx;Pharyngeal residue - valleculae;Pharyngeal residue - pyriform;Reduced laryngeal elevation;Reduced  airway/laryngeal closure;Penetration/Aspiration during swallow;Reduced tongue base retraction Pharyngeal Material enters airway, remains ABOVE vocal cords then ejected out Pharyngeal- Nectar Teaspoon -- Pharyngeal -- Pharyngeal- Nectar Cup Delayed swallow initiation-vallecula;Reduced laryngeal elevation;Reduced airway/laryngeal closure;Reduced tongue base retraction;Pharyngeal residue - posterior pharnyx;Pharyngeal residue - cp segment;Pharyngeal residue - pyriform;Pharyngeal residue - valleculae;Penetration/Aspiration during swallow Pharyngeal Material enters airway, passes BELOW cords without attempt by patient to eject out (silent aspiration);Material enters airway, CONTACTS cords and then ejected out Pharyngeal- Nectar Straw -- Pharyngeal -- Pharyngeal- Thin Teaspoon -- Pharyngeal -- Pharyngeal- Thin Cup Delayed swallow initiation-pyriform sinuses;Reduced laryngeal elevation;Reduced airway/laryngeal closure;Reduced tongue base retraction;Penetration/Aspiration during swallow Pharyngeal Material enters airway, CONTACTS cords and then ejected out Pharyngeal- Thin Straw -- Pharyngeal -- Pharyngeal- Puree Reduced tongue base retraction;Reduced laryngeal elevation;Penetration/Aspiration during swallow;Pharyngeal residue - valleculae Pharyngeal Material enters airway, remains ABOVE vocal cords then ejected out Pharyngeal- Mechanical Soft -- Pharyngeal -- Pharyngeal- Regular -- Pharyngeal -- Pharyngeal- Multi-consistency -- Pharyngeal -- Pharyngeal- Pill -- Pharyngeal -- Pharyngeal Comment --  CHL IP CERVICAL ESOPHAGEAL PHASE 11/11/2016 Cervical Esophageal Phase WFL Pudding Teaspoon -- Pudding Cup -- Honey Teaspoon -- Honey Cup -- Nectar Teaspoon -- Nectar Cup -- Nectar Straw -- Thin Teaspoon -- Thin Cup -- Thin Straw -- Puree -- Mechanical Soft -- Regular -- Multi-consistency -- Pill -- Cervical Esophageal Comment -- No flowsheet data found. Page, Selinda Orion 11/11/2016, 1:13 PM              Results for orders placed  or performed during the hospital encounter of 10/27/16 (from the past 72 hour(s))  Glucose, capillary     Status: Abnormal   Collection Time: 11/10/16 12:09 PM  Result Value Ref Range   Glucose-Capillary 174 (H) 65 - 99 mg/dL  Glucose, capillary     Status: Abnormal   Collection Time: 11/10/16  4:29 PM  Result Value Ref Range   Glucose-Capillary 107 (H) 65 - 99 mg/dL   Comment 1 Notify RN   Glucose, capillary     Status: Abnormal   Collection Time: 11/10/16  8:48 PM  Result Value Ref Range   Glucose-Capillary 172 (H) 65 - 99 mg/dL   Comment 1 Notify RN   Glucose, capillary  Status: None   Collection Time: 11/11/16  6:45 AM  Result Value Ref Range   Glucose-Capillary 92 65 - 99 mg/dL  Glucose, capillary     Status: None   Collection Time: 11/11/16 12:01 PM  Result Value Ref Range   Glucose-Capillary 79 65 - 99 mg/dL  Glucose, capillary     Status: Abnormal   Collection Time: 11/11/16  4:49 PM  Result Value Ref Range   Glucose-Capillary 116 (H) 65 - 99 mg/dL  Glucose, capillary     Status: Abnormal   Collection Time: 11/11/16  8:52 PM  Result Value Ref Range   Glucose-Capillary 167 (H) 65 - 99 mg/dL  Glucose, capillary     Status: None   Collection Time: 11/12/16  6:35 AM  Result Value Ref Range   Glucose-Capillary 97 65 - 99 mg/dL   Comment 1 Notify RN   Glucose, capillary     Status: Abnormal   Collection Time: 11/12/16 11:48 AM  Result Value Ref Range   Glucose-Capillary 112 (H) 65 - 99 mg/dL  Glucose, capillary     Status: Abnormal   Collection Time: 11/12/16  4:57 PM  Result Value Ref Range   Glucose-Capillary 189 (H) 65 - 99 mg/dL  Glucose, capillary     Status: Abnormal   Collection Time: 11/12/16  8:40 PM  Result Value Ref Range   Glucose-Capillary 193 (H) 65 - 99 mg/dL   Comment 1 Notify RN   Basic metabolic panel     Status: Abnormal   Collection Time: 11/13/16  5:48 AM  Result Value Ref Range   Sodium 141 135 - 145 mmol/L   Potassium 4.0 3.5 - 5.1  mmol/L   Chloride 111 101 - 111 mmol/L   CO2 23 22 - 32 mmol/L   Glucose, Bld 148 (H) 65 - 99 mg/dL   BUN 10 6 - 20 mg/dL   Creatinine, Ser 0.91 0.61 - 1.24 mg/dL   Calcium 9.1 8.9 - 10.3 mg/dL   GFR calc non Af Amer >60 >60 mL/min   GFR calc Af Amer >60 >60 mL/min    Comment: (NOTE) The eGFR has been calculated using the CKD EPI equation. This calculation has not been validated in all clinical situations. eGFR's persistently <60 mL/min signify possible Chronic Kidney Disease.    Anion gap 7 5 - 15  Glucose, capillary     Status: Abnormal   Collection Time: 11/13/16  6:33 AM  Result Value Ref Range   Glucose-Capillary 121 (H) 65 - 99 mg/dL   Comment 1 Notify RN      HEENT:poor dentition, exopthalmos, no ptosis EOMI Cardio: RRR and no murmur Resp: CTA B/L and unlabored GI: BS positive and NT, ND Extremity:  No Edema Skin:   Intact Neuro: Abnormal Sensory difficult to assess due to dysarthria, Abnormal Motor 0/5 RUE trace elbow flex, 2- Left knee extensor, Dysarthric and no emotional lability- Musc/Skel:  Other mild tenderness to palpation over lumbar paraspinals Gen NAD   Assessment/Plan: 1. Functional deficits secondary to Right hemiparesis , dysarthria, dysphagia which require 3+ hours per day of interdisciplinary therapy in a comprehensive inpatient rehab setting. Physiatrist is providing close team supervision and 24 hour management of active medical problems listed below. Physiatrist and rehab team continue to assess barriers to discharge/monitor patient progress toward functional and medical goals. FIM: Function - Bathing Bathing activity did not occur: Refused Position: Shower Body parts bathed by patient: Right arm, Chest, Abdomen, Front perineal area, Buttocks, Right upper  leg, Left upper leg, Right lower leg, Left lower leg Body parts bathed by helper: Back, Left arm Assist Level: Touching or steadying assistance(Pt > 75%)  Function- Upper Body  Dressing/Undressing What is the patient wearing?: Pull over shirt/dress Pull over shirt/dress - Perfomed by patient: Thread/unthread right sleeve, Put head through opening, Thread/unthread left sleeve, Pull shirt over trunk Pull over shirt/dress - Perfomed by helper: Pull shirt over trunk Assist Level: More than reasonable time Function - Lower Body Dressing/Undressing What is the patient wearing?: Underwear, Pants, Non-skid slipper socks Position: Wheelchair/chair at sink Underwear - Performed by patient: Thread/unthread right underwear leg, Thread/unthread left underwear leg, Pull underwear up/down Underwear - Performed by helper: Thread/unthread right underwear leg, Thread/unthread left underwear leg, Pull underwear up/down Pants- Performed by patient: Thread/unthread left pants leg, Thread/unthread right pants leg Pants- Performed by helper: Thread/unthread right pants leg, Thread/unthread left pants leg Non-skid slipper socks- Performed by patient: Don/doff right sock, Don/doff left sock Non-skid slipper socks- Performed by helper: Don/doff left sock, Don/doff right sock Assist for footwear: Partial/moderate assist Assist for lower body dressing: Touching or steadying assistance (Pt > 75%)  Function - Toileting Toileting steps completed by patient: Adjust clothing prior to toileting Toileting steps completed by helper: Performs perineal hygiene, Adjust clothing after toileting Toileting Assistive Devices: Grab bar or rail Assist level: Touching or steadying assistance (Pt.75%)  Function - Air cabin crew transfer assistive device: Grab bar Mechanical lift: Stedy Assist level to toilet: Touching or steadying assistance (Pt > 75%) Assist level from toilet: Touching or steadying assistance (Pt > 75%)  Function - Chair/bed transfer Chair/bed transfer method: Stand pivot Chair/bed transfer assist level: Moderate assist (Pt 50 - 74%/lift or lower) Chair/bed transfer assistive  device: Walker ([place]) Chair/bed transfer details: Tactile cues for sequencing, Verbal cues for sequencing, Verbal cues for technique, Verbal cues for precautions/safety, Tactile cues for weight shifting  Function - Locomotion: Wheelchair Will patient use wheelchair at discharge?: Yes Type: Manual Max wheelchair distance: 153f  Assist Level: Supervision or verbal cues Assist Level: Supervision or verbal cues Wheel 150 feet activity did not occur: Safety/medical concerns Assist Level: Supervision or verbal cues Turns around,maneuvers to table,bed, and toilet,negotiates 3% grade,maneuvers on rugs and over doorsills: No Function - Locomotion: Ambulation Assistive device: Walker-hemi Max distance: 129f Assist level: Maximal assist (Pt 25 - 49%) Assist level: Maximal assist (Pt 25 - 49%) Walk 50 feet with 2 turns activity did not occur: Safety/medical concerns Walk 150 feet activity did not occur: Safety/medical concerns Walk 10 feet on uneven surfaces activity did not occur: Safety/medical concerns  Function - Comprehension Comprehension: Auditory Comprehension assist level: Follows basic conversation/direction with no assist  Function - Expression Expression: Nonverbal Expression assistive device: Communication board Expression assist level: Expresses basic 75 - 89% of the time/requires cueing 10 - 24% of the time. Needs helper to occlude trach/needs to repeat words.  Function - Social Interaction Social Interaction assist level: Interacts appropriately 90% of the time - Needs monitoring or encouragement for participation or interaction.  Function - Problem Solving Problem solving assist level: Solves basic 90% of the time/requires cueing < 10% of the time, Solves basic problems with no assist  Function - Memory Memory assist level: Recognizes or recalls 90% of the time/requires cueing < 10% of the time Patient normally able to recall (first 3 days only): Staff names and faces, That  he or she is in a hospital   Medical Problem List and Plan: 1.  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  DVT Prophylaxis/Anticoagulation: Subcutaneous Lovenox. Venous Doppler studies negative,3. Pain Management: Neurontin 300 mg daily at bedtime 4. Mood: Ego support 5. Neuropsych: This patient is capable of making decisions on his own behalf. 6. Skin/Wound Care:  Routine skin checks 7. Fluids/Electrolytes/Nutrition:  Routine I&O with follow-up chemistries- BMET in am poor oral fluid intake 341m encourage 8. Dysphagia. Dysphagia #1 honey liquids. Monitor hydration. Follow-up speech therapy 9.Hypertension. Lisinopril 158mdaily increased on 10/6 - elevated systolic 1019/54ncrease to 2028mn 10/12, labile BP , HR variable as well no med changes, asymptomatic, added amlodipine 2.5mg68m/15, increase to 5mg 35m17- controlled 10/18 Vitals:   11/12/16 1418 11/13/16 0500  BP: (!) 144/70 138/73  Pulse: 84 63  Resp: 18 17  Temp: 98.2 F (36.8 C) 98.4 F (36.9 C)  SpO2: 100% 97%   10. Diabetes mellitus peripheral neuropathy. Hemoglobin A1c 8.9. SSI.Check blood sugars before meals and at bedtime. Patient on Glucotrol 5 mg twice a day, Glucophage 1000 mg twice a day. Resume as needed CBG (last 3)   Recent Labs  11/12/16 1657 11/12/16 2040 11/13/16 0633  GLUCAP 189* 193* 121*  fair control 10/18- no current diabetic meds, creeping up resume glucotrol- may be causing diarrhea but this was reportedly home med 11.Polysubstance abuse. UDS positive for cocaine. Provide counseling with Neuropsych 12.History polycythemia. Follow-up labs 13. ?PBA: Monitor.  Will consider Neuropsych, improved , on sertraline 14.  Buccal surface laceration likely related to dyscoordinated oral movements, left side over salivary duct will schedule  MMW 15.  Hypoalbuminemia- start prostat 16.  Pt c/o difficulty holding eyes open , no ptosis or EOM weakness, severe dysarthria and R hemiparesis obscures neuro exam, check ACH-R antibody 17.  Exopthalmos- normal TSH  T4 results, LOS (Days) 17 A FACE TO FACE EVALUATION WAS PERFORMED  Chanoch Mccleery E 11/13/2016, 8:49 AM

## 2016-11-13 NOTE — Progress Notes (Signed)
Physical Therapy Weekly Progress Note  Patient Details  Name: Eric Morton MRN: [id number] Date of Birth: 04/15/1965  Beginning of progress report period: November 05, 2016 End of progress report period: November 12, 2016  Today's Date: 11/13/2016 PT Individual Time: 6789-3810 AND 1300-1400 PT Individual Time Calculation (min): 43 min AND 60 min   Patient has met 2 of 3 short term goals.  Pt continues to make slow slow progress toward goals, but limited from making increased progress due to apraxia and low tone in the RLE/RUE. Pt currently requires supervision assist for WC propulsion, min-mod assist for transfers depending on fatigue levels, and mod-max assist for gait up to 65f with HW.   Patient continues to demonstrate the following deficits muscle weakness, impaired timing and sequencing, abnormal tone, motor apraxia and decreased motor planning, decreased attention, decreased awareness, decreased problem solving, decreased safety awareness, decreased memory and delayed processing and decreased sitting balance, decreased standing balance, decreased postural control, hemiplegia and decreased balance strategies and therefore will continue to benefit from skilled PT intervention to increase functional independence with mobility.  Patient progressing toward long term goals..  Continue plan of care.  PT Short Term Goals Week 2:  PT Short Term Goal 1 (Week 2): Pt will transfer R and L with min assist consistently  PT Short Term Goal 1 - Progress (Week 2): Progressing toward goal PT Short Term Goal 2 (Week 2): Pt will ambulate 368fwith mod assist and LRAd  PT Short Term Goal 2 - Progress (Week 2): Met PT Short Term Goal 3 (Week 2): Pt will perform bed mobility with min assist  PT Short Term Goal 3 - Progress (Week 2): Met Week 3:  PT Short Term Goal 1 (Week 3): STG = LTG due to ELOS  Skilled Therapeutic Interventions/Progress Updates:   Pt received sitting in WC and agreeable to PT  WC  mobility with L hemi technique instructed by PT x [id number] supervision assist. Transfer training with mod assist from PT for squat pivot. Max cues required for head/hips relationship and improved WB through the RLE for increases hip clearance.   Lateral scooting at EOB with supervision assist. Pt requires only cues for set up for lateral scooting.   Sit<>supine with min assist from PT. And moderate assist for sequencing as well as compensatory strategies to safely control the RLE and RUE.   Supine NMR: SAQ 2x10 BLE. Bridges x 10. rollling r and L x 5 with min assist and manual facilitation.   Patient returned to room and [facility] and all needs met.     Session 2.   Pt received sitting in WC and agreeable to PT  WC PT transported pt to rehab gym in WC.[facility] Blocked practice transfer training for WC<>mat table with mod assist progressing to min assist with squat pivot technique. Max cues required for set up as well as sequencing for improved head/hip control, increased height of raise from seat, and proper posture to prevent lateral LOB to the R.   Stair negotiation training for up/ down 1 step(3') x4 mod assist to stabilize the RLE to prevent buckling as well as max cues for posture and sequencing.   Gait training x [id number] HW and mod assist progressing to max assist. Max cues for gait pattern, terminal knee extension, and step length on the RLE.   WC mobility training back to room with supervision assist from PT with min  cues for safety in turns.   Blocked practice bed mobility training with min assist from PT for sit<>supine. Moderate cues for use of LLE to control the RLE and improved trunk control   Patient returned to room and left sitting in [facility] with call bell in reach and all needs met.        Therapy Documentation Precautions:  Precautions Precautions: Fall Restrictions Weight Bearing Restrictions: No    Pain: Pain Assessment Pain  Assessment: No/denies pain Pain Score: 0-No pain   See Function Navigator for Current Functional Status.  Therapy/Group: Individual Therapy  Lorie Phenix 11/13/2016, 9:35 AM

## 2016-11-13 NOTE — Progress Notes (Signed)
Occupational Therapy Session Note  Patient Details  Name: Eric Morton MRN: [id number] Date of Birth: [birth year]  Today's Date: 11/13/2016 OT Individual Time: 1045-1200 OT Individual Time Calculation (min): 75 min    Short Term Goals: Week 3:  OT Short Term Goal 1 (Week 3): Pt will thread B LEs into pants with supervision OT Short Term Goal 2 (Week 3): Pt will complete 2/3 toileting tasks with mod A standing balance OT Short Term Goal 3 (Week 3): Pt will complete squat pivot transfer to toilet with close supervision OT Short Term Goal 4 (Week 3): Pt will use R UE with use of bathmit during bathing task with mod A  Skilled Therapeutic Interventions/Progress Updates:    Pt seen for OT session focusing on R UE function, and dynamic sitting and standing balance. Pt sitting up in w/c upon arrival, agreeable to tx session, He declined bathing/dressing this morning.  He self propelled w/c throughout unit using L LE with supervision and min cuing for environmental obstacles on R. Pt able to make desire known to use Dyn[employer] this session. Completed from seated and standing positions addressing dynamic sitting and standing balance. Pt able to locate all items across board, averaging close to same time R vs. L. During standing trials, pt stood with use of hemi walker and overall min A for dynamic balance using hemi walker with assist to block R knee. Completed trial from w/c level using R UE with stabilization at elbow and wrist pt able to activate shoulder flexion and internal rotation to activate lights. At table top level, completed arm skate activity for internal/external rotation, min A for external rotation. NuStep using B LEs, with R knee guard on pt able to push through fully with R LE with significantly increased time and effort. Completed for 5 minutes. Pt returned to room at end of session, left seated in w/c with all needs in reach.   Therapy Documentation Precautions:   Precautions Precautions: Fall Restrictions Weight Bearing Restrictions: No Pain:   No/ denies pain  See Function Navigator for Current Functional Status.   Therapy/Group: Individual Therapy  Lewis, Blessed Cotham C 11/13/2016, 7:10 AM

## 2016-11-13 NOTE — Progress Notes (Signed)
Social Work Patient ID: Eric Morton, male   DOB: [birth year], 51 y.o.   MRN: [id number]  Met with pt and spoke with sister-[name] to discuss team conference progress toward his goals and the plan to begin Transitioning to next venue of care. Sister has applied for SSD and Medicaid via Development worker, community here. Pt is in agreement and sister would like if at all possible for him to go to [place]. Will work on  NHP.

## 2016-11-13 NOTE — NC FL2 (Signed)
[name]     IDENTIFICATION  Patient Name: Eric Morton Birthdate: Sep 15, 1965 Sex: male Admission Date (Current Location): 10/27/2016  [name] and [place] Number:  [place] pending Facility and Address:  [name], [name], [name], [name]      Provider Number: [id number]  Attending Physician Name and Address:  Charlett Blake, MD  Relative Name and Phone Number:  Eric Morton [phone]    Current Level of Care: Other (Comment) (rehab) Recommended Level of Care: [place] Prior Approval Number:    Date Approved/Denied:   PASRR Number: [id number]  Discharge Plan: SNF    Current Diagnoses: Patient Active Problem List   Diagnosis Date Noted  . Hemiparesis affecting right side as late effect of stroke ([place]) 10/29/2016  . Embolic cerebral infarction ([place]) 10/27/2016  . Alcohol use   . Cocaine use   . Dysarthria due to recent stroke   . Dysphagia due to recent stroke   . Left-sided weakness 10/23/2016  . Cerebral thrombosis with cerebral infarction 10/23/2016  . Aphasia   . Hyperlipidemia 07/27/2015  . Cerebrovascular accident (CVA) due to occlusion of cerebral artery ([place])   . Stroke ([place]) 03/21/2015  . Polycythemia vera ([place]) 03/21/2015  . Hx of adenomatous colonic polyps   . Dehydration   . Diarrhea   . Protein-calorie malnutrition ([place])   . Cellulitis of breast of male 09/25/2011  . HTN (hypertension) 09/25/2011  . DM (diabetes mellitus) ([place]) 09/25/2011  . Leukopenia 09/25/2011  . Alcohol abuse 09/25/2011  . Cocaine abuse ([place]) 09/25/2011  . Tobacco use 09/25/2011  . Neuropathy 09/25/2011    Orientation RESPIRATION BLADDER Height & Weight     Self, Time, Situation, Place  Normal Continent Weight: 165 lb (74.8 kg) Height:  5\' 7"  (170.2 cm)  BEHAVIORAL SYMPTOMS/MOOD NEUROLOGICAL BOWEL NUTRITION STATUS      Continent Diet (Dys  2 thin liquids)  AMBULATORY STATUS COMMUNICATION OF NEEDS Skin   Extensive Assist Non-Verbally (communication board) Normal                       Personal Care Assistance Level of Assistance  Bathing, Feeding, Dressing Bathing Assistance: Limited assistance Feeding assistance: Limited assistance (supervision level ) Dressing Assistance: Limited assistance     Functional Limitations Info  Speech     Speech Info: Impaired    SPECIAL CARE FACTORS FREQUENCY  PT (By licensed PT), OT (By licensed OT), Speech therapy     PT Frequency: 5x week OT Frequency: 5x week     Speech Therapy Frequency: 5x week      Contractures Contractures Info: Not present    Additional Factors Info  Code Status, Allergies Code Status Info: Full Code Allergies Info: Lipitor (Atrovastatin)           Current Medications (11/13/2016):  This is the current hospital active medication list Current Facility-Administered Medications  Medication Dose Route Frequency Provider Last Rate Last Dose  . acetaminophen (TYLENOL) tablet 650 mg  650 mg Oral Q4H PRN Cathlyn Parsons, PA-C   650 mg at 10/31/16 1053   Or  . acetaminophen (TYLENOL) solution 650 mg  650 mg Per Tube Q4H PRN Angiulli, Lavon Paganini, PA-C       Or  . acetaminophen (TYLENOL) suppository 650 mg  650 mg Rectal Q4H PRN Angiulli, Lavon Paganini, PA-C      . amLODipine (NORVASC) tablet 2.5 mg  2.5 mg Oral Daily Kirsteins, Luanna Salk, MD   2.5 mg at 11/13/16 0810  . clopidogrel (PLAVIX) tablet 75 mg  75 mg Oral Daily Cathlyn Parsons, PA-C   75 mg at 11/13/16 0810  . enoxaparin (LOVENOX) injection 40 mg  40 mg Subcutaneous Q24H Cathlyn Parsons, PA-C   40 mg at 11/13/16 1916  . folic acid (FOLVITE) tablet 1 mg  1 mg Oral Daily Cathlyn Parsons, PA-C   1 mg at 11/13/16 0810  . gabapentin (NEURONTIN) capsule 300 mg  300 mg Oral QHS Cathlyn Parsons, PA-C   300 mg at 11/12/16 2111  . glipiZIDE (GLUCOTROL) tablet 2.5 mg  2.5 mg Oral QAC breakfast  Charlett Blake, MD   2.5 mg at 11/13/16 0810  . insulin aspart (novoLOG) injection 0-9 Units  0-9 Units Subcutaneous TID WC & HS Cathlyn Parsons, PA-C   1 Units at 11/13/16 1301  . lisinopril (PRINIVIL,ZESTRIL) tablet 20 mg  20 mg Oral Daily Kirsteins, Luanna Salk, MD   20 mg at 11/13/16 0810  . magic mouthwash w/lidocaine  5 mL Oral TID PRN Angiulli, Lavon Paganini, PA-C      . multivitamin with minerals tablet 1 tablet  1 tablet Oral Daily Cathlyn Parsons, PA-C   1 tablet at 11/13/16 0810  . MUSCLE RUB CREA   Topical PRN Kirsteins, Luanna Salk, MD   1 application at 60/60/04 1153  . ondansetron (ZOFRAN) tablet 4 mg  4 mg Oral Q6H PRN Angiulli, Lavon Paganini, PA-C       Or  . ondansetron [facility]) injection 4 mg  4 mg Intravenous Q6H PRN Angiulli, Lavon Paganini, PA-C      . polyvinyl alcohol (LIQUIFILM TEARS) 1.4 % ophthalmic solution 2 drop  2 drop Both Eyes BID Kirsteins, Luanna Salk, MD   2 drop at 11/13/16 0813  . RESOURCE THICKENUP CLEAR   Oral PRN Cathlyn Parsons, PA-C      . rosuvastatin (CRESTOR) tablet 40 mg  40 mg Oral q1800 Cathlyn Parsons, PA-C   40 mg at 11/12/16 1810  . sertraline (ZOLOFT) tablet 25 mg  25 mg Oral Daily Charlett Blake, MD   25 mg at 11/13/16 0810  . sodium chloride (OCEAN) 0.65 % nasal spray 1 spray  1 spray Each Nare PRN AngiulliLavon Paganini, PA-C   1 spray at 10/28/16 1856  . sorbitol 70 % solution 30 mL  30 mL Oral Daily PRN Cathlyn Parsons, PA-C   30 mL at 11/06/16 1515  . thiamine (VITAMIN B-1) tablet 100 mg  100 mg Oral Daily Cathlyn Parsons, PA-C   100 mg at 11/13/16 5997     Discharge Medications: Please see discharge summary for a list of discharge medications.  Relevant Imaging Results:  Relevant Lab Results:   Additional Information SSN: [id number]  Eric Morton, Gardiner Rhyme, LCSW

## 2016-11-13 NOTE — Progress Notes (Signed)
Speech Language Pathology Weekly Progress and Session Note  Patient Details  Name: Eric Morton MRN: [id number] Date of Birth: 1965/12/30  Beginning of progress report period: November 05, 2016 End of progress report period: November 13, 2016  Today's Date: 11/13/2016 SLP Individual Time: 1856-3149 SLP Individual Time Calculation (min): 30 min  Short Term Goals: Week 2: SLP Short Term Goal 1 (Week 2): Pt will consume current diet with Min s/s asiration with supervision verbal cues for use of swallowing compensatory strategies. SLP Short Term Goal 1 - Progress (Week 2): Not met SLP Short Term Goal 2 (Week 2): Pt will tolerate trials of nectar thick liquids with use of compensatory strategies with Min s/s aspiration over 3 observed sessions to demonstrate readiness for diet advancement. SLP Short Term Goal 2 - Progress (Week 2): Met SLP Short Term Goal 3 (Week 2): Pt will tolerate trials of Dys 2 with use of compensatory strategies with Min s/s aspiration and oral residue over 3 observed sessions to demonstrate readiness for diet advancement. SLP Short Term Goal 3 - Progress (Week 2): Progressing toward goal SLP Short Term Goal 4 (Week 2): Pt will demonstrate effective use of communication board to express wants/needs at word level with mod I  SLP Short Term Goal 4 - Progress (Week 2): Met SLP Short Term Goal 5 (Week 2): Pt will demonstrate functional problem solving for basic and familiar tasks with Supervision verbal cues. SLP Short Term Goal 5 - Progress (Week 2): Met SLP Short Term Goal 6 (Week 2): Pt will utilize speech intelligibility strategies at word level with Max A verbal cues to acheive 50% intelligibility.  SLP Short Term Goal 6 - Progress (Week 2): Not met    New Short Term Goals: Week 3: SLP Short Term Goal 1 (Week 3): Pt will utilize speech intelligibility strategies at word level with Max A verbal cues to acheive 50% intelligibility.  SLP Short Term Goal 2 (Week 3): Pt will  demonstrate functional problem solving for mildly complex tasks with Supervision verbal cues. SLP Short Term Goal 3 (Week 3): Pt will consume dysphagia 2 with thin liquids and minimal overt s/s of aspiration with Min A to use compensatory swallow strategies.  SLP Short Term Goal 4 (Week 3): Pt will perform 3 sets of 5 reps of pharyngeal strengthening exercises with Max A cues.   Weekly Progress Updates: Pt has made good progress over last reporting period. As a result he has met 4 of 6 STGs. Pt is currently tolerating a dysphagia 2 diet with thin liquids and Max A to Mod A for use of compensatory swallow strategies. Pt continues to exhibit decreased speech intelligibility at the word level and requires skilled ST to address carryover of aspiration precautions, pharyngeal strengthening exercises and speech intelligibility.      Intensity: Minumum of 1-2 x/day, 30 to 90 minutes Frequency: 3 to 5 out of 7 days Duration/Length of Stay: 2.5-3 weeks Treatment/Interventions: Cueing hierarchy;Functional tasks;Dysphagia/aspiration precaution training;Patient/family education;Speech/Language facilitation;Cognitive remediation/compensation   Daily Session  Skilled Therapeutic Interventions: Skilled treatment session focused on dysphagia and speech goals. SLP facilitated session by providing skilled supervision of pt consuming dysphagia 2 breakfast with thin liquids via cup. Pt required Max A to Mod A cues to use chin tuck with liquids. Difficult for pt to coordinate d/t oral deficits and anterior labial spillage. Pt with functional oral phase with dysphagia 2 but clinically pt doesn't really masticate or grind bolus d/t lack of dentition. Poor prognosis for further diet  advancement. SLP further facilitated session by providing Min A cues ofr sustained attention to task, no talking while eating and Max A cues for speech intelligibility at the word level. Pt unable to communicate specific need with gesutres or  verbal supplementation. He wanted to go to day room and eat breakfast. Required 10 yes/no guesses by SLP to figure out what he was communicating.     Function:   Eating Eating   Modified Consistency Diet: Yes Eating Assist Level: Set up assist for;Supervision or verbal cues;Helper checks for pocketed food   Eating Set Up Assist For: Opening containers       Cognition Comprehension Comprehension assist level: Follows basic conversation/direction with no assist  Expression Expression assistive device: Communication board Expression assist level: Expresses complex ideas: With extra time/assistive device  Social Interaction Social Interaction assist level: Interacts appropriately with others with medication or extra time (anti-anxiety, antidepressant).  Problem Solving Problem solving assist level: Solves basic problems with no assist  Memory Memory assist level: Recognizes or recalls 90% of the time/requires cueing < 10% of the time   Therapy/Group: Individual Therapy  Rakel Junio 11/13/2016, 3:55 PM

## 2016-11-14 ENCOUNTER — Inpatient Hospital Stay (HOSPITAL_COMMUNITY): Payer: No Typology Code available for payment source | Admitting: Physical Therapy

## 2016-11-14 ENCOUNTER — Inpatient Hospital Stay (HOSPITAL_COMMUNITY): Payer: No Typology Code available for payment source | Admitting: Speech Pathology

## 2016-11-14 ENCOUNTER — Inpatient Hospital Stay (HOSPITAL_COMMUNITY): Payer: No Typology Code available for payment source | Admitting: Occupational Therapy

## 2016-11-14 LAB — GLUCOSE, CAPILLARY
GLUCOSE-CAPILLARY: 116 mg/dL — AB (ref 65–99)
GLUCOSE-CAPILLARY: 230 mg/dL — AB (ref 65–99)
GLUCOSE-CAPILLARY: 144 mg/dL — AB (ref 65–99)
Glucose-Capillary: 213 mg/dL — ABNORMAL HIGH (ref 65–99)

## 2016-11-14 NOTE — Progress Notes (Signed)
Physical Therapy Session Note  Patient Details  Name: Eric Morton MRN: [id number] Date of Birth: 01/03/1966  Today's Date: 11/14/2016 PT Individual Time: 1430-1530 PT Individual Time Calculation (min): 60 min   Short Term Goals: Week 3:  PT Short Term Goal 1 (Week 3): STG = LTG due to ELOS  Skilled Therapeutic Interventions/Progress Updates:    Pt received sitting in WC and agreeable to PT. WC mobility instructed by Pt on level surface x 236f with supervision assist with min cues for increased speed to and amplitude of propulsion to reduce overall energy expenditure. WC mobility also instructed by PT on unlevel surface with min assist to ascend uphill grade. Moderate cues for improved anterior weight shift to allow improved power of WC propulsion and prevent posterior LOB. Blocked practice transfer training with squat pivot technique and overall min assist from PT. Seated BLE NMR: LAQ, HS curls against manual resistance, hip abduction with light manual resistance: all completed x 10 BLE. Trunk control with reaching tasks in unsupported sitting min assist with lateral weight shift to the R outside BOS.  [facility] and all needs met.        Therapy Documentation Precautions:  Precautions Precautions: Fall Restrictions Weight Bearing Restrictions: No Vital Signs: Therapy Vitals Temp: 98.1 F (36.7 C) Temp Source: Oral Pulse Rate: 74 Resp: 19 BP: (!) 150/75 Patient Position (if appropriate): Sitting Oxygen Therapy SpO2: 100 % O2 Device: Not Delivered Pain: Pain Assessment Pain Assessment: No/denies pain  See Function Navigator for Current Functional Status.   Therapy/Group: Individual Therapy  [name]10/19/2018, 5:29 PM

## 2016-11-14 NOTE — Progress Notes (Signed)
Subjective/Complaints: No more loose stools since yesterday pm  ROS- not able to obtain full review due to communication issues, Objective: Vital Signs: Blood pressure (!) 142/96, pulse 63, temperature 97.9 F (36.6 C), temperature source Oral, resp. rate 18, height '5\' 7"'  (1.702 m), weight 74.8 kg (165 lb), SpO2 100 %. No results found. Results for orders placed or performed during the hospital encounter of 10/27/16 (from the past 72 hour(s))  Glucose, capillary     Status: None   Collection Time: 11/11/16 12:01 PM  Result Value Ref Range   Glucose-Capillary 79 65 - 99 mg/dL  Glucose, capillary     Status: Abnormal   Collection Time: 11/11/16  4:49 PM  Result Value Ref Range   Glucose-Capillary 116 (H) 65 - 99 mg/dL  Glucose, capillary     Status: Abnormal   Collection Time: 11/11/16  8:52 PM  Result Value Ref Range   Glucose-Capillary 167 (H) 65 - 99 mg/dL  Glucose, capillary     Status: None   Collection Time: 11/12/16  6:35 AM  Result Value Ref Range   Glucose-Capillary 97 65 - 99 mg/dL   Comment 1 Notify RN   Glucose, capillary     Status: Abnormal   Collection Time: 11/12/16 11:48 AM  Result Value Ref Range   Glucose-Capillary 112 (H) 65 - 99 mg/dL  Glucose, capillary     Status: Abnormal   Collection Time: 11/12/16  4:57 PM  Result Value Ref Range   Glucose-Capillary 189 (H) 65 - 99 mg/dL  Glucose, capillary     Status: Abnormal   Collection Time: 11/12/16  8:40 PM  Result Value Ref Range   Glucose-Capillary 193 (H) 65 - 99 mg/dL   Comment 1 Notify RN   Basic metabolic panel     Status: Abnormal   Collection Time: 11/13/16  5:48 AM  Result Value Ref Range   Sodium 141 135 - 145 mmol/L   Potassium 4.0 3.5 - 5.1 mmol/L   Chloride 111 101 - 111 mmol/L   CO2 23 22 - 32 mmol/L   Glucose, Bld 148 (H) 65 - 99 mg/dL   BUN 10 6 - 20 mg/dL   Creatinine, Ser 0.91 0.61 - 1.24 mg/dL   Calcium 9.1 8.9 - 10.3 mg/dL   GFR calc non Af Amer >60 >60 mL/min   GFR calc Af Amer  >60 >60 mL/min    Comment: (NOTE) The eGFR has been calculated using the CKD EPI equation. This calculation has not been validated in all clinical situations. eGFR's persistently <60 mL/min signify possible Chronic Kidney Disease.    Anion gap 7 5 - 15  Glucose, capillary     Status: Abnormal   Collection Time: 11/13/16  6:33 AM  Result Value Ref Range   Glucose-Capillary 121 (H) 65 - 99 mg/dL   Comment 1 Notify RN   Glucose, capillary     Status: Abnormal   Collection Time: 11/13/16 12:35 PM  Result Value Ref Range   Glucose-Capillary 121 (H) 65 - 99 mg/dL   Comment 1 Notify RN   Glucose, capillary     Status: Abnormal   Collection Time: 11/13/16  4:43 PM  Result Value Ref Range   Glucose-Capillary 102 (H) 65 - 99 mg/dL  Glucose, capillary     Status: Abnormal   Collection Time: 11/13/16  9:00 PM  Result Value Ref Range   Glucose-Capillary 132 (H) 65 - 99 mg/dL  Glucose, capillary     Status: Abnormal  Collection Time: 11/14/16  6:49 AM  Result Value Ref Range   Glucose-Capillary 116 (H) 65 - 99 mg/dL     HEENT:poor dentition, exopthalmos, no ptosis EOMI Cardio: RRR and no murmur Resp: CTA B/L and unlabored GI: BS positive and NT, ND Extremity:  No Edema Skin:   Intact Neuro: Abnormal Sensory difficult to assess due to dysarthria, Abnormal Motor 0/5 RUE trace elbow flex, 2- Left knee extensor, Dysarthric and no emotional lability- Musc/Skel:  Other mild tenderness to palpation over lumbar paraspinals Gen NAD   Assessment/Plan: 1. Functional deficits secondary to Right hemiparesis , dysarthria, dysphagia which require 3+ hours per day of interdisciplinary therapy in a comprehensive inpatient rehab setting. Physiatrist is providing close team supervision and 24 hour management of active medical problems listed below. Physiatrist and rehab team continue to assess barriers to discharge/monitor patient progress toward functional and medical goals. FIM: Function -  Bathing Bathing activity did not occur: Refused Position: Shower Body parts bathed by patient: Right arm, Chest, Abdomen, Front perineal area, Buttocks, Right upper leg, Left upper leg, Right lower leg, Left lower leg Body parts bathed by helper: Back, Left arm Assist Level: Touching or steadying assistance(Pt > 75%)  Function- Upper Body Dressing/Undressing What is the patient wearing?: Pull over shirt/dress Pull over shirt/dress - Perfomed by patient: Thread/unthread right sleeve, Put head through opening, Thread/unthread left sleeve, Pull shirt over trunk Pull over shirt/dress - Perfomed by helper: Pull shirt over trunk Assist Level: More than reasonable time Function - Lower Body Dressing/Undressing What is the patient wearing?: Underwear, Pants, Non-skid slipper socks Position: Wheelchair/chair at sink Underwear - Performed by patient: Thread/unthread right underwear leg, Thread/unthread left underwear leg, Pull underwear up/down Underwear - Performed by helper: Thread/unthread right underwear leg, Thread/unthread left underwear leg, Pull underwear up/down Pants- Performed by patient: Thread/unthread left pants leg, Thread/unthread right pants leg Pants- Performed by helper: Thread/unthread right pants leg, Thread/unthread left pants leg Non-skid slipper socks- Performed by patient: Don/doff right sock, Don/doff left sock Non-skid slipper socks- Performed by helper: Don/doff left sock, Don/doff right sock Assist for footwear: Partial/moderate assist Assist for lower body dressing: Touching or steadying assistance (Pt > 75%)  Function - Toileting Toileting steps completed by patient: Adjust clothing prior to toileting Toileting steps completed by helper: Performs perineal hygiene, Adjust clothing after toileting Toileting Assistive Devices: Grab bar or rail, Other (comment) (stedy) Assist level: Touching or steadying assistance (Pt.75%)  Function - Air cabin crew transfer  assistive device: Mechanical lift Mechanical lift: Stedy Assist level to toilet: Touching or steadying assistance (Pt > 75%) Assist level from toilet: Touching or steadying assistance (Pt > 75%)  Function - Chair/bed transfer Chair/bed transfer method: Stand pivot Chair/bed transfer assist level: Moderate assist (Pt 50 - 74%/lift or lower) Chair/bed transfer assistive device: Walker (HW) Chair/bed transfer details: Tactile cues for sequencing, Verbal cues for sequencing, Verbal cues for technique, Verbal cues for precautions/safety, Tactile cues for weight shifting  Function - Locomotion: Wheelchair Will patient use wheelchair at discharge?: Yes Type: Manual Max wheelchair distance: 138f  Assist Level: Supervision or verbal cues Assist Level: Supervision or verbal cues Wheel 150 feet activity did not occur: Safety/medical concerns Assist Level: Supervision or verbal cues Turns around,maneuvers to table,bed, and toilet,negotiates 3% grade,maneuvers on rugs and over doorsills: No Function - Locomotion: Ambulation Assistive device: Walker-hemi Max distance: 145f Assist level: Maximal assist (Pt 25 - 49%) Assist level: Maximal assist (Pt 25 - 49%) Walk 50 feet with 2 turns activity did  not occur: Safety/medical concerns Walk 150 feet activity did not occur: Safety/medical concerns Walk 10 feet on uneven surfaces activity did not occur: Safety/medical concerns  Function - Comprehension Comprehension: Auditory Comprehension assist level: Follows basic conversation/direction with no assist  Function - Expression Expression: Verbal Expression assistive device: Communication board Expression assist level: Expresses complex ideas: With extra time/assistive device  Function - Social Interaction Social Interaction assist level: Interacts appropriately with others with medication or extra time (anti-anxiety, antidepressant).  Function - Problem Solving Problem solving assist level:  Solves basic problems with no assist  Function - Memory Memory assist level: Recognizes or recalls 90% of the time/requires cueing < 10% of the time Patient normally able to recall (first 3 days only): Staff names and faces, That he or she is in a hospital   Medical Problem List and Plan: 1.  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  DVT Prophylaxis/Anticoagulation: Subcutaneous Lovenox. Venous Doppler studies negative,3. Pain Management: Neurontin 300 mg daily at bedtime 4. Mood: Ego support 5. Neuropsych: This patient is capable of making decisions on his own behalf. 6. Skin/Wound Care:  Routine skin checks 7. Fluids/Electrolytes/Nutrition:  Routine I&O with follow-up chemistries- BMET normal poor oral fluid intake 334m cont to encourage, takes 100%meals 8. Dysphagia. Dysphagia #1 honey liquids. Monitor hydration. Follow-up speech therapy 9.Hypertension. Lisinopril 138mdaily increased on 10/6 - elevated systolic 1065/68ncrease to 2074mn 10/12, labile BP , HR variable as well no med changes, asymptomatic, added amlodipine 2.5mg24m/15, increase to 5mg 90m17- mildly elevated 10/19- no further changes today Vitals:   11/13/16 1515 11/14/16 0425  BP: 137/70 (!) 142/96  Pulse: 81 63  Resp: 18 18  Temp: 97.6 F (36.4 C) 97.9 F (36.6 C)  SpO2: 100% 100%   10. Diabetes mellitus peripheral neuropathy. Hemoglobin A1c 8.9. SSI.Check blood sugars before meals and at bedtime. Patient on Glucotrol 5 mg twice a day, Glucophage 1000 mg twice a day. Resume as needed CBG (last 3)   Recent Labs  11/13/16 1643 11/13/16 2100 11/14/16 0649  GLUCAP 102* 132* 116*  good control 10/19- glucotrol- may be causing diarrhea but this was reportedly home med 11.Polysubstance abuse. UDS positive for cocaine. Provide counseling with  Neuropsych 12.History polycythemia. Follow-up labs 13. ?PBA: Monitor.  Will consider Neuropsych, improved , on sertraline 14.  Buccal surface laceration likely related to dyscoordinated oral movements, left side over salivary duct will schedule MMW 15.  Hypoalbuminemia- start prostat 16.  Pt c/o difficulty holding eyes open , no ptosis or EOM weakness, severe dysarthria and R hemiparesis obscures neuro exam, check ACH-R antibody 17.  Exopthalmos- normal TSH  T4 results, LOS (Days) 18 A FACE TO FACE EVALUATION WAS PERFORMED  KIRSTEINS,ANDREW E 11/14/2016, 8:24 AM

## 2016-11-14 NOTE — Progress Notes (Signed)
Physical Therapy Session Note  Patient Details  Name: Eric Morton MRN: [id number] Date of Birth: 1965-09-11  Today's Date: 11/14/2016 PT Individual Time: 1000-1100 PT Individual Time Calculation (min): 60 min   Short Term Goals: Week 3:  PT Short Term Goal 1 (Week 3): STG = LTG due to ELOS  Skilled Therapeutic Interventions/Progress Updates:    no c/o pain.  Session focus on NMR for carryover to functional mobility.    Blocked practice transfers to R and L focus on recall of w/c set up, head/hips relationship, forward weight shift, and motor planning for pivot.  Pt requires up to mod assist for transfers R/L but consistently min assist throughout session.  NMR in hookyling with 2 trials to fatigue of trunk rotation R/L, and adduction squeeze with ball.  Nustep x5 minutes LEs only focus on neutral RLE positioning, reciprocal stepping pattern retraining, and forced use.  Pt returned to room at end of session and positioned in w/c with call bell in reach and needs met.   Therapy Documentation Precautions:  Precautions Precautions: Fall Restrictions Weight Bearing Restrictions: No   See Function Navigator for Current Functional Status.   Therapy/Group: Individual Therapy  Michel Santee 11/14/2016, 10:51 AM

## 2016-11-14 NOTE — Progress Notes (Signed)
Social Work Patient ID: Eric Morton, male   DOB: 1965-05-06, 51 y.o.   MRN: [id number]  Have faxed out [facility] will await bed offers. Pt and sister would like [facility] if at all possible. Will work on bed offers.

## 2016-11-14 NOTE — Progress Notes (Signed)
Occupational Therapy Session Note  Patient Details  Name: Eric Morton MRN: [id number] Date of Birth: 10-16-1965  Today's Date: 11/14/2016 OT Individual Time: 0845-1000 OT Individual Time Calculation (min): 75 min    Short Term Goals: Week 3:  OT Short Term Goal 1 (Week 3): Pt will thread B LEs into pants with supervision OT Short Term Goal 2 (Week 3): Pt will complete 2/3 toileting tasks with mod A standing balance OT Short Term Goal 3 (Week 3): Pt will complete squat pivot transfer to toilet with close supervision OT Short Term Goal 4 (Week 3): Pt will use R UE with use of bathmit during bathing task with mod A  Skilled Therapeutic Interventions/Progress Updates:    Pt seen for OT ADL bathing/dressing session. Pt sitting up in recliner eating breakfast upon arrival, agreeable to tx session. He completed squat pivot transfers throughout session with min A overall and min cuing for hand palcement. With steadying asist for dynamic sitting balance, pt able to reach to floor to doff R sock, though still required assist for threading of clothing over R LE from seated position. He bathed seated on BSC, hand over hand assist for using R UE to wash L UE with pt intiating push/pull motions, total A for mainttianing grasp on washcloth. He dressed seated in w/c at sink, stood at hemi walker with min steadying assist and blocking of R knee while pt pulled up pants. This was a great improvement from previous tx sessions.  He stood at sink to complete hand hygiene and oral care, overall min A with blocking of R knee and min A for management of R UE when washing hands. Pt propelled w/c to therapy gym using L LE with supervision, Left in gym with hand off to PT.   Therapy Documentation Precautions:  Precautions Precautions: Fall Restrictions Weight Bearing Restrictions: No Pain:   No/ denies pain  See Function Navigator for Current Functional Status.   Therapy/Group: Individual  Therapy  Lewis, Pierce Biagini C 11/14/2016, 6:47 AM

## 2016-11-14 NOTE — Progress Notes (Signed)
Speech Language Pathology Daily Session Note  Patient Details  Name: Eric Morton MRN: [id number] Date of Birth: 1965/03/29  Today's Date: 11/14/2016 SLP Individual Time: 1135-1205 SLP Individual Time Calculation (min): 30 min  Short Term Goals: Week 3: SLP Short Term Goal 1 (Week 3): Pt will utilize speech intelligibility strategies at word level with Max A verbal cues to acheive 50% intelligibility.  SLP Short Term Goal 2 (Week 3): Pt will demonstrate functional problem solving for mildly complex tasks with Supervision verbal cues. SLP Short Term Goal 3 (Week 3): Pt will consume dysphagia 2 with thin liquids and minimal overt s/s of aspiration with Min A to use compensatory swallow strategies.  SLP Short Term Goal 4 (Week 3): Pt will perform 3 sets of 5 reps of pharyngeal strengthening exercises with Max A cues.   Skilled Therapeutic Interventions:  Pt was seen for skilled ST targeting goals for speech and swallowing.  Pt initiated more verbal communication today instead of use of communication board as has been his practice in previous therapy sessions.  Pt was even able to intelligibly state "I'm leaving," referring to change in discharge plan to SNF.  Pt was otherwise mod assist for multimodal communication to convey needs and wants to therapist.   Pt needed x1 min demonstration cue for appropriate use of chin tuck when consuming thin liquids via straw.  With following sips, pt was able to return demonstration of posture with supervision.  No overt s/s of aspiration were evident with solids or liquids.  Pt was left in chair with call bell within reach.  Continue per current plan of care.    Function:  Eating Eating   Modified Consistency Diet: Yes Eating Assist Level: Set up assist for;Supervision or verbal cues   Eating Set Up Assist For: Opening containers       Cognition Comprehension Comprehension assist level: Follows basic conversation/direction with no assist  Expression  Expression assistive device: Communication board Expression assist level: Expresses basic 50 - 74% of the time/requires cueing 25 - 49% of the time. Needs to repeat parts of sentences.  Social Interaction Social Interaction assist level: Interacts appropriately with others with medication or extra time (anti-anxiety, antidepressant).  Problem Solving Problem solving assist level: Solves basic problems with no assist  Memory Memory assist level: Recognizes or recalls 90% of the time/requires cueing < 10% of the time    Pain Pain Assessment Pain Assessment: No/denies pain  Therapy/Group: Individual Therapy  Ronelle Smallman, Selinda Orion 11/14/2016, 2:33 PM

## 2016-11-15 ENCOUNTER — Inpatient Hospital Stay (HOSPITAL_COMMUNITY): Payer: No Typology Code available for payment source | Admitting: Occupational Therapy

## 2016-11-15 ENCOUNTER — Inpatient Hospital Stay (HOSPITAL_COMMUNITY): Payer: No Typology Code available for payment source | Admitting: Physical Therapy

## 2016-11-15 ENCOUNTER — Inpatient Hospital Stay (HOSPITAL_COMMUNITY): Payer: No Typology Code available for payment source | Admitting: Speech Pathology

## 2016-11-15 DIAGNOSIS — D62 Acute posthemorrhagic anemia: Secondary | ICD-10-CM

## 2016-11-15 LAB — GLUCOSE, CAPILLARY
GLUCOSE-CAPILLARY: 108 mg/dL — AB (ref 65–99)
GLUCOSE-CAPILLARY: 73 mg/dL (ref 65–99)
GLUCOSE-CAPILLARY: 260 mg/dL — AB (ref 65–99)

## 2016-11-15 NOTE — Progress Notes (Signed)
Speech Language Pathology Daily Session Note  Patient Details  Name: Eric Morton MRN: [id number] Date of Birth: [birth year]  Today's Date: 11/15/2016 SLP Individual Time: 0934-1000 SLP Individual Time Calculation (min): 26 min  Short Term Goals: Week 3: SLP Short Term Goal 1 (Week 3): Pt will utilize speech intelligibility strategies at word level with Max A verbal cues to acheive 50% intelligibility.  SLP Short Term Goal 2 (Week 3): Pt will demonstrate functional problem solving for mildly complex tasks with Supervision verbal cues. SLP Short Term Goal 3 (Week 3): Pt will consume dysphagia 2 with thin liquids and minimal overt s/s of aspiration with Min A to use compensatory swallow strategies.  SLP Short Term Goal 4 (Week 3): Pt will perform 3 sets of 5 reps of pharyngeal strengthening exercises with Max A cues.   Skilled Therapeutic Interventions:  Pt was seen for skilled ST targeting dysphagia and communication goals.  Pt consumed thin liquids via straw with supervision cues for use of chin tuck and no overt s/s of aspiration.  Pt reports no coughing or choking with solids or liquids but does repeatedly ask about having outside food brought by family members or friends.  SLP provided pt with a list of recommended dys 2 textures to facilitate diet compliance with outside foods.  Pt continues to use more verbal expression to communicate with therapist and was able to intelligibly say "thank you" and some intermittent words in context with min assist verbal cues.  Pt was left in wheelchair with call bell within reach.  Continue per current plan of care.    Function:  Eating Eating   Modified Consistency Diet: Yes Eating Assist Level: Supervision or verbal cues           Cognition Comprehension Comprehension assist level: Follows basic conversation/direction with no assist  Expression Expression assistive device: Communication board Expression assist level: Expresses basic 50 - 74%  of the time/requires cueing 25 - 49% of the time. Needs to repeat parts of sentences.  Social Interaction Social Interaction assist level: Interacts appropriately with others with medication or extra time (anti-anxiety, antidepressant).  Problem Solving Problem solving assist level: Solves basic problems with no assist  Memory Memory assist level: Recognizes or recalls 90% of the time/requires cueing < 10% of the time    Pain Pain Assessment Pain Assessment: No/denies pain  Therapy/Group: Individual Therapy  Ario Mcdiarmid, Elmyra Ricks L 11/15/2016, 10:05 AM

## 2016-11-15 NOTE — Progress Notes (Signed)
Physical Therapy Session Note  Patient Details  Name: Eric Morton MRN: [id number] Date of Birth: 12-19-1965  Today's Date: 11/15/2016 PT Individual Time: 0800-0840 PT Individual Time Calculation (min): 40 min   Short Term Goals: Week 3:  PT Short Term Goal 1 (Week 3): STG = LTG due to ELOS  Skilled Therapeutic Interventions/Progress Updates:   Pt received sitting in WC and agreeable to PT. PT assisted pt to don shoes with max assist for time management. Pt able to fully extend R knee to allos PT to don/doff socks as well as don shoe with slight resistance.   Pt transported to rehab gym in [facility]. Car transfer training completed by PT with min assist for entry to car and mod assist to return to [facility].   Bed mobility training with min assist from PT to control the RLE for sit<>supine. All squat pivot transfers completed with min-mod assist from PT and moderate cues for set up.   Sitting trunk and postural neuromotor control training. Lateral reaches supported by therpay ballx, lateral reaches with PT to support RUE, partial sit up with emphasis on R sided trunk activaiton and improved spinal alignment 3 x 12 for each exercise.   Patient returned to room and left sitting in [facility] with call bell in reach and all needs met.         Therapy Documentation Precautions:  Precautions Precautions: Fall Restrictions Weight Bearing Restrictions: No Pain:0/10   See Function Navigator for Current Functional Status.   Therapy/Group: Individual Therapy  Lorie Phenix 11/15/2016, 9:32 AM

## 2016-11-15 NOTE — Progress Notes (Signed)
Subjective/Complaints: Patient seen lying in bed this morning. He indicates that he slept well overnight. He is nonverbal.  ROS- not able to obtain full review due to communication issues, however appears to deny CP, SOB, nausea, vomiting, diarrhea.  Objective: Vital Signs: Blood pressure 119/67, pulse 83, temperature 97.6 F (36.4 C), temperature source Oral, resp. rate 18, height '5\' 7"'  (1.702 m), weight 74.8 kg (165 lb), SpO2 100 %. No results found. Results for orders placed or performed during the hospital encounter of 10/27/16 (from the past 72 hour(s))  Basic metabolic panel     Status: Abnormal   Collection Time: 11/13/16  5:48 AM  Result Value Ref Range   Sodium 141 135 - 145 mmol/L   Potassium 4.0 3.5 - 5.1 mmol/L   Chloride 111 101 - 111 mmol/L   CO2 23 22 - 32 mmol/L   Glucose, Bld 148 (H) 65 - 99 mg/dL   BUN 10 6 - 20 mg/dL   Creatinine, Ser 0.91 0.61 - 1.24 mg/dL   Calcium 9.1 8.9 - 10.3 mg/dL   GFR calc non Af Amer >60 >60 mL/min   GFR calc Af Amer >60 >60 mL/min    Comment: (NOTE) The eGFR has been calculated using the CKD EPI equation. This calculation has not been validated in all clinical situations. eGFR's persistently <60 mL/min signify possible Chronic Kidney Disease.    Anion gap 7 5 - 15  Glucose, capillary     Status: Abnormal   Collection Time: 11/13/16  6:33 AM  Result Value Ref Range   Glucose-Capillary 121 (H) 65 - 99 mg/dL   Comment 1 Notify RN   Glucose, capillary     Status: Abnormal   Collection Time: 11/13/16 12:35 PM  Result Value Ref Range   Glucose-Capillary 121 (H) 65 - 99 mg/dL   Comment 1 Notify RN   Glucose, capillary     Status: Abnormal   Collection Time: 11/13/16  4:43 PM  Result Value Ref Range   Glucose-Capillary 102 (H) 65 - 99 mg/dL  Glucose, capillary     Status: Abnormal   Collection Time: 11/13/16  9:00 PM  Result Value Ref Range   Glucose-Capillary 132 (H) 65 - 99 mg/dL  Glucose, capillary     Status: Abnormal    Collection Time: 11/14/16  6:49 AM  Result Value Ref Range   Glucose-Capillary 116 (H) 65 - 99 mg/dL  Glucose, capillary     Status: Abnormal   Collection Time: 11/14/16 12:24 PM  Result Value Ref Range   Glucose-Capillary 213 (H) 65 - 99 mg/dL  Glucose, capillary     Status: Abnormal   Collection Time: 11/14/16  5:43 PM  Result Value Ref Range   Glucose-Capillary 230 (H) 65 - 99 mg/dL  Glucose, capillary     Status: Abnormal   Collection Time: 11/14/16 10:00 PM  Result Value Ref Range   Glucose-Capillary 144 (H) 65 - 99 mg/dL   Comment 1 Notify RN   Glucose, capillary     Status: Abnormal   Collection Time: 11/15/16  6:26 AM  Result Value Ref Range   Glucose-Capillary 108 (H) 65 - 99 mg/dL  Glucose, capillary     Status: None   Collection Time: 11/15/16 11:43 AM  Result Value Ref Range   Glucose-Capillary 73 65 - 99 mg/dL   Comment 1 Notify RN   Glucose, capillary     Status: Abnormal   Collection Time: 11/15/16  5:09 PM  Result Value Ref Range  Glucose-Capillary 260 (H) 65 - 99 mg/dL     HENT: Poor dentition. Normocephalic. Atraumatic. Eyes: exopthalmos, no ptosis,  EOMI Cardio: RRR and no JVD Resp: CTA B/L and unlabored GI: BS positive and ND Skin:   Intact. Warm and dry. Neuro: Motor 0/5 RUE  Musc/Skel:  No edema. No tenderness in extremities. Gen NAD. Vital signs reviewed.   Assessment/Plan: 1. Functional deficits secondary to Right hemiparesis , dysarthria, dysphagia which require 3+ hours per day of interdisciplinary therapy in a comprehensive inpatient rehab setting. Physiatrist is providing close team supervision and 24 hour management of active medical problems listed below. Physiatrist and rehab team continue to assess barriers to discharge/monitor patient progress toward functional and medical goals. FIM: Function - Bathing Bathing activity did not occur: Refused Position: Shower Body parts bathed by patient: Right arm, Chest, Abdomen, Front perineal  area, Buttocks, Right upper leg, Left upper leg, Right lower leg, Left lower leg, Left arm Body parts bathed by helper: Back Assist Level: Touching or steadying assistance(Pt > 75%)  Function- Upper Body Dressing/Undressing What is the patient wearing?: Pull over shirt/dress Pull over shirt/dress - Perfomed by patient: Thread/unthread right sleeve, Put head through opening, Thread/unthread left sleeve, Pull shirt over trunk Pull over shirt/dress - Perfomed by helper: Pull shirt over trunk Assist Level: More than reasonable time Function - Lower Body Dressing/Undressing What is the patient wearing?: Underwear, Pants, Non-skid slipper socks Position: Wheelchair/chair at sink Underwear - Performed by patient: Thread/unthread left underwear leg, Pull underwear up/down Underwear - Performed by helper: Thread/unthread right underwear leg Pants- Performed by patient: Thread/unthread left pants leg, Pull pants up/down Pants- Performed by helper: Thread/unthread right pants leg Non-skid slipper socks- Performed by patient: Don/doff right sock, Don/doff left sock Non-skid slipper socks- Performed by helper: Don/doff left sock, Don/doff right sock Assist for footwear: Supervision/touching assist Assist for lower body dressing: Touching or steadying assistance (Pt > 75%)  Function - Toileting Toileting steps completed by patient: Adjust clothing prior to toileting Toileting steps completed by helper: Performs perineal hygiene, Adjust clothing after toileting Toileting Assistive Devices: Grab bar or rail Assist level: Two helpers  Function - Air cabin crew transfer assistive device: Facilities manager lift: Stedy Assist level to toilet: 2 helpers Assist level from toilet: 2 helpers  Function - Chair/bed transfer Chair/bed transfer method: Squat pivot Chair/bed transfer assist level: Touching or steadying assistance (Pt > 75%) Chair/bed transfer assistive device: Walker  ([place]) Chair/bed transfer details: Tactile cues for sequencing, Verbal cues for sequencing, Verbal cues for technique, Verbal cues for precautions/safety, Tactile cues for weight shifting  Function - Locomotion: Wheelchair Will patient use wheelchair at discharge?: Yes Type: Manual Max wheelchair distance: 122f  Assist Level: Supervision or verbal cues Assist Level: Supervision or verbal cues Wheel 150 feet activity did not occur: Safety/medical concerns Assist Level: Supervision or verbal cues Turns around,maneuvers to table,bed, and toilet,negotiates 3% grade,maneuvers on rugs and over doorsills: No Function - Locomotion: Ambulation Assistive device: Walker-hemi Max distance: 319f Assist level: Maximal assist (Pt 25 - 49%) Assist level: Moderate assist (Pt 50 - 74%) Walk 50 feet with 2 turns activity did not occur: Safety/medical concerns Walk 150 feet activity did not occur: Safety/medical concerns Walk 10 feet on uneven surfaces activity did not occur: Safety/medical concerns  Function - Comprehension Comprehension: Auditory Comprehension assist level: Follows basic conversation/direction with no assist  Function - Expression Expression: Verbal Expression assistive device: Communication board Expression assist level: Expresses basic 50 - 74% of the time/requires  cueing 25 - 49% of the time. Needs to repeat parts of sentences.  Function - Social Interaction Social Interaction assist level: Interacts appropriately with others with medication or extra time (anti-anxiety, antidepressant).  Function - Problem Solving Problem solving assist level: Solves basic problems with no assist  Function - Memory Memory assist level: Recognizes or recalls 90% of the time/requires cueing < 10% of the time Patient normally able to recall (first 3 days only): Staff names and faces, That he or she is in a hospital   Medical Problem List and Plan: 1.  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  DVT Prophylaxis/Anticoagulation: Subcutaneous Lovenox. Venous Doppler studies negative 3. Pain Management: Neurontin 300 mg daily at bedtime 4. Mood: Ego support 5. Neuropsych: This patient is capable of making decisions on his own behalf. 6. Skin/Wound Care:  Routine skin checks 7. Fluids/Electrolytes/Nutrition:  Routine I&Os 8. Dysphagia. D2 thin. Monitor hydration. Follow-up speech therapy  Advance diet as tolerated 9.Hypertension. Lisinopril increase to 44m on 10/12, l  Amlodipine increased to 548m10/17  Controlled on 10/20 Vitals:   11/15/16 0500 11/15/16 1351  BP: 139/80 119/67  Pulse: 62 83  Resp: 18 18  Temp: 98.3 F (36.8 C) 97.6 F (36.4 C)  SpO2: 99% 100%   10. Diabetes mellitus peripheral neuropathy. Hemoglobin A1c 8.9. SSI.Check blood sugars before meals and at bedtime. Patient on Glucotrol 5 mg twice a day, Glucophage 1000 mg twice a day. Resume as needed CBG (last 3)   Recent Labs  11/15/16 0626 11/15/16 1143 11/15/16 1709  GLUCAP 108* 73 260*   Appears to be improving on 10/20 11.Polysubstance abuse. UDS positive for cocaine. Provide counseling with Neuropsych 12.History polycythemia.   Labs stable 13. ?PBA: Monitor.  Will consider Neuropsych, improved, on sertraline 14.  Buccal surface laceration likely related to dyscoordinated oral movements, left side over salivary duct will schedule MMW 15.  Hypoalbuminemia- start prostat 16.  Pt c/o difficulty holding eyes open , no ptosis or EOM weakness, severe dysarthria and R hemiparesis obscures neuro exam, ACH-R WNL 17.  Exopthalmos- normal TSH  T4 results 18. ABLA  Hemoglobin 12.8 on 10/2  Continue to monitor  LOS (Days) 19 A FACE TO FACE EVALUATION WAS PERFORMED  Ankit AnLorie Phenix0/20/2018, 9:53 PM

## 2016-11-16 LAB — GLUCOSE, CAPILLARY
GLUCOSE-CAPILLARY: 99 mg/dL (ref 65–99)
GLUCOSE-CAPILLARY: 217 mg/dL — AB (ref 65–99)
GLUCOSE-CAPILLARY: 91 mg/dL (ref 65–99)
Glucose-Capillary: 255 mg/dL — ABNORMAL HIGH (ref 65–99)

## 2016-11-16 NOTE — Progress Notes (Signed)
Subjective/Complaints: Pt seen laying in bed this AM.  No reported issues overnight. Nonverbal.   ROS: Appears to denies CP, SOB, N/V/D.  Objective: Vital Signs: Blood pressure (!) 110/58, pulse 75, temperature 98.2 F (36.8 C), temperature source Oral, resp. rate 20, height 5\' 7"  (1.702 m), weight 74.8 kg (165 lb), SpO2 99 %. No results found. Results for orders placed or performed during the hospital encounter of 10/27/16 (from the past 72 hour(s))  Glucose, capillary     Status: Abnormal   Collection Time: 11/13/16  4:43 PM  Result Value Ref Range   Glucose-Capillary 102 (H) 65 - 99 mg/dL  Glucose, capillary     Status: Abnormal   Collection Time: 11/13/16  9:00 PM  Result Value Ref Range   Glucose-Capillary 132 (H) 65 - 99 mg/dL  Glucose, capillary     Status: Abnormal   Collection Time: 11/14/16  6:49 AM  Result Value Ref Range   Glucose-Capillary 116 (H) 65 - 99 mg/dL  Glucose, capillary     Status: Abnormal   Collection Time: 11/14/16 12:24 PM  Result Value Ref Range   Glucose-Capillary 213 (H) 65 - 99 mg/dL  Glucose, capillary     Status: Abnormal   Collection Time: 11/14/16  5:43 PM  Result Value Ref Range   Glucose-Capillary 230 (H) 65 - 99 mg/dL  Glucose, capillary     Status: Abnormal   Collection Time: 11/14/16 10:00 PM  Result Value Ref Range   Glucose-Capillary 144 (H) 65 - 99 mg/dL   Comment 1 Notify RN   Glucose, capillary     Status: Abnormal   Collection Time: 11/15/16  6:26 AM  Result Value Ref Range   Glucose-Capillary 108 (H) 65 - 99 mg/dL  Glucose, capillary     Status: None   Collection Time: 11/15/16 11:43 AM  Result Value Ref Range   Glucose-Capillary 73 65 - 99 mg/dL   Comment 1 Notify RN   Glucose, capillary     Status: Abnormal   Collection Time: 11/15/16  5:09 PM  Result Value Ref Range   Glucose-Capillary 260 (H) 65 - 99 mg/dL  Glucose, capillary     Status: None   Collection Time: 11/16/16  6:46 AM  Result Value Ref Range    Glucose-Capillary 99 65 - 99 mg/dL  Glucose, capillary     Status: Abnormal   Collection Time: 11/16/16 12:26 PM  Result Value Ref Range   Glucose-Capillary 217 (H) 65 - 99 mg/dL     HENT: Poor dentition. Normocephalic. Atraumatic. Eyes: exopthalmos, no ptosis,  EOMI Cardio: RRR and no JVD Resp: CTA B/L and unlabored GI: BS positive and ND Skin:   Intact. Warm and dry. Neuro: Motor 0/5 RUE (unchanged) Musc/Skel:  No edema. No tenderness in extremities. Gen NAD. Vital signs reviewed.   Assessment/Plan: 1. Functional deficits secondary to Right hemiparesis , dysarthria, dysphagia which require 3+ hours per day of interdisciplinary therapy in a comprehensive inpatient rehab setting. Physiatrist is providing close team supervision and 24 hour management of active medical problems listed below. Physiatrist and rehab team continue to assess barriers to discharge/monitor patient progress toward functional and medical goals. FIM: Function - Bathing Bathing activity did not occur: Refused Position: Shower Body parts bathed by patient: Right arm, Chest, Abdomen, Front perineal area, Buttocks, Right upper leg, Left upper leg, Right lower leg, Left lower leg, Left arm Body parts bathed by helper: Back Assist Level: Touching or steadying assistance(Pt > 75%)  Function- Upper Body  Dressing/Undressing What is the patient wearing?: Pull over shirt/dress Pull over shirt/dress - Perfomed by patient: Thread/unthread right sleeve, Put head through opening, Thread/unthread left sleeve, Pull shirt over trunk Pull over shirt/dress - Perfomed by helper: Pull shirt over trunk Assist Level: More than reasonable time Function - Lower Body Dressing/Undressing What is the patient wearing?: Underwear, Pants, Non-skid slipper socks Position: Wheelchair/chair at sink Underwear - Performed by patient: Thread/unthread left underwear leg, Pull underwear up/down Underwear - Performed by helper: Thread/unthread  right underwear leg Pants- Performed by patient: Thread/unthread left pants leg, Pull pants up/down Pants- Performed by helper: Thread/unthread right pants leg Non-skid slipper socks- Performed by patient: Don/doff right sock, Don/doff left sock Non-skid slipper socks- Performed by helper: Don/doff left sock, Don/doff right sock Assist for footwear: Supervision/touching assist Assist for lower body dressing: Touching or steadying assistance (Pt > 75%)  Function - Toileting Toileting steps completed by patient: Adjust clothing prior to toileting Toileting steps completed by helper: Performs perineal hygiene, Adjust clothing after toileting Toileting Assistive Devices: Grab bar or rail Assist level: Two helpers  Function - Air cabin crew transfer assistive device: Facilities manager lift: Stedy Assist level to toilet: 2 helpers Assist level from toilet: 2 helpers  Function - Chair/bed transfer Chair/bed transfer method: Squat pivot Chair/bed transfer assist level: Touching or steadying assistance (Pt > 75%) Chair/bed transfer assistive device: Environmental consultant ([place]) Chair/bed transfer details: Tactile cues for sequencing, Verbal cues for sequencing, Verbal cues for technique, Verbal cues for precautions/safety, Tactile cues for weight shifting  Function - Locomotion: Wheelchair Will patient use wheelchair at discharge?: Yes Type: Manual Max wheelchair distance: 149ft  Assist Level: Supervision or verbal cues Assist Level: Supervision or verbal cues Wheel 150 feet activity did not occur: Safety/medical concerns Assist Level: Supervision or verbal cues Turns around,maneuvers to table,bed, and toilet,negotiates 3% grade,maneuvers on rugs and over doorsills: No Function - Locomotion: Ambulation Assistive device: Walker-hemi Max distance: 70ft  Assist level: Maximal assist (Pt 25 - 49%) Assist level: Moderate assist (Pt 50 - 74%) Walk 50 feet with 2 turns activity did not  occur: Safety/medical concerns Walk 150 feet activity did not occur: Safety/medical concerns Walk 10 feet on uneven surfaces activity did not occur: Safety/medical concerns  Function - Comprehension Comprehension: Auditory Comprehension assist level: Follows basic conversation/direction with extra time/assistive device  Function - Expression Expression: Verbal, Nonverbal Expression assistive device: Communication board Expression assist level: Expresses basic 50 - 74% of the time/requires cueing 25 - 49% of the time. Needs to repeat parts of sentences.  Function - Social Interaction Social Interaction assist level: Interacts appropriately with others with medication or extra time (anti-anxiety, antidepressant).  Function - Problem Solving Problem solving assist level: Solves basic problems with no assist  Function - Memory Memory assist level: Recognizes or recalls 90% of the time/requires cueing < 10% of the time Patient normally able to recall (first 3 days only): Staff names and faces, That he or she is in a hospital   Medical Problem List and Plan: 1.  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  DVT Prophylaxis/Anticoagulation: Subcutaneous Lovenox. Venous Doppler studies negative 3. Pain Management: Neurontin 300 mg daily at bedtime 4. Mood: Ego support 5. Neuropsych: This patient is capable of making decisions on his own behalf. 6. Skin/Wound Care:  Routine skin checks 7. Fluids/Electrolytes/Nutrition:  Routine I&Os 8. Dysphagia. D2  thin. Monitor hydration. Follow-up speech therapy  Advance diet as tolerated 9.Hypertension. Lisinopril increase to 20mg  on 10/12, l  Amlodipine increased to 5mg  10/17  Slightly labile, but overall controlled on 10/21 Vitals:   11/16/16 0430 11/16/16 1210  BP: (!) 165/83 (!) 110/58   Pulse: 66 75  Resp: 18 20  Temp: 97.6 F (36.4 C) 98.2 F (36.8 C)  SpO2: 100% 99%   10. Diabetes mellitus peripheral neuropathy. Hemoglobin A1c 8.9. SSI. Check blood sugars before meals and at bedtime. Patient on Glucotrol 5 mg twice a day, Glucophage 1000 mg twice a day. Resume as needed CBG (last 3)   Recent Labs  11/15/16 1709 11/16/16 0646 11/16/16 1226  GLUCAP 260* 99 217*   Labile, will cont to monitor, may require meal time coverage 11.Polysubstance abuse. UDS positive for cocaine. Provide counseling with Neuropsych 12.History polycythemia.   Labs stable 13. ?PBA: Monitor.  Will consider Neuropsych, improved, on sertraline 14.  Buccal surface laceration likely related to dyscoordinated oral movements, left side over salivary duct will schedule MMW 15.  Hypoalbuminemia- start prostat 16.  Pt c/o difficulty holding eyes open , no ptosis or EOM weakness, severe dysarthria and R hemiparesis obscures neuro exam, ACH-R WNL 17.  Exopthalmos- normal TSH  T4 results 18. ABLA  Hemoglobin 12.8 on 10/2  Continue to monitor  LOS (Days) 20 A FACE TO FACE EVALUATION WAS PERFORMED  Ankit Lorie Phenix 11/16/2016, 3:30 PM

## 2016-11-17 ENCOUNTER — Inpatient Hospital Stay (HOSPITAL_COMMUNITY): Payer: No Typology Code available for payment source | Admitting: Occupational Therapy

## 2016-11-17 ENCOUNTER — Inpatient Hospital Stay (HOSPITAL_COMMUNITY): Payer: Medicaid Other

## 2016-11-17 ENCOUNTER — Inpatient Hospital Stay (HOSPITAL_COMMUNITY): Payer: Medicaid Other | Admitting: Speech Pathology

## 2016-11-17 ENCOUNTER — Inpatient Hospital Stay (HOSPITAL_COMMUNITY): Payer: No Typology Code available for payment source | Admitting: Physical Therapy

## 2016-11-17 DIAGNOSIS — R269 Unspecified abnormalities of gait and mobility: Secondary | ICD-10-CM

## 2016-11-17 DIAGNOSIS — I69398 Other sequelae of cerebral infarction: Secondary | ICD-10-CM

## 2016-11-17 LAB — GLUCOSE, CAPILLARY
GLUCOSE-CAPILLARY: 126 mg/dL — AB (ref 65–99)
GLUCOSE-CAPILLARY: 191 mg/dL — AB (ref 65–99)
Glucose-Capillary: 102 mg/dL — ABNORMAL HIGH (ref 65–99)
Glucose-Capillary: 150 mg/dL — ABNORMAL HIGH (ref 65–99)
Glucose-Capillary: 170 mg/dL — ABNORMAL HIGH (ref 65–99)

## 2016-11-17 LAB — CREATININE, SERUM
Creatinine, Ser: 0.97 mg/dL (ref 0.61–1.24)
GFR calc Af Amer: >60 mL/min (ref >60)
GFR calc non Af Amer: >60 mL/min (ref >60)

## 2016-11-17 MED ORDER — GLIPIZIDE 5 MG PO TABS
5.0000 mg | ORAL_TABLET | Freq: Every day | ORAL | Status: DC
  Administered 2016-11-18: 5 mg via ORAL
  Filled 2016-11-17: qty 1

## 2016-11-17 NOTE — Progress Notes (Signed)
Occupational Therapy Weekly Progress Note  Patient Details  Name: Eric Morton MRN: [id number] Date of Birth: 1965-12-31  Beginning of progress report period: November 11, 2016 End of progress report period: November 17, 2016  Today's Date: 11/17/2016 OT Individual Time: 5364-6803 OT Individual Time Calculation (min): 75 min    Patient has met 2 of 4 short term goals.  Pt cont to make steady progress towards OT goals. He cont to require assist to thread R LE into clothes due to limited hip flexion and decreased dynamic sitting balance. He cont to require increased assist to use R UE at functional level and is not yet at level to use to assist with bathing.  He has much improved functional standing balance, able to stand with min A at hemi walker to complete clothing management. With use of grab bar, he is able to complete toilet transfers with close supervision and VCs for technique.  Pt's family is unable to provide accessible d/c placement for him and therefore seeking SNF placement in order to cont to improve functional ambulation and mobility as well as increase independence with ADLs.   Patient continues to demonstrate the following deficits:abnormal posture, ataxia, cognitive deficits, flaccid hemiplegia and hemiparesis and muscle weakness (generalized) and therefore will continue to benefit from skilled OT intervention to enhance overall performance with BADL and Reduce care partner burden.  Patient progressing toward long term goals..  Continue plan of care.  OT Short Term Goals Week 3:  OT Short Term Goal 1 (Week 3): Pt will thread B LEs into pants with supervision OT Short Term Goal 1 - Progress (Week 3): Not met OT Short Term Goal 2 (Week 3): Pt will complete 2/3 toileting tasks with mod A standing balance OT Short Term Goal 2 - Progress (Week 3): Met OT Short Term Goal 3 (Week 3): Pt will complete squat pivot transfer to toilet with close supervision OT Short Term Goal 3 -  Progress (Week 3): Met OT Short Term Goal 4 (Week 3): Pt will use R UE with use of bathmit during bathing task with mod A OT Short Term Goal 4 - Progress (Week 3): Not met Week 4:  OT Short Term Goal 1 (Week 4): STG=LTG as awaiting SNF placement  Skilled Therapeutic Interventions/Progress Updates:    Pt seen for OT ADL bathing/dressing session. Pt sitting up in w/c upon arrival with NT present providing supervision assist for meal, pt ready for tx session and bathing at shower level. He gathered clothing items from w/c level with min A for dynamic sitting balance due to R lean.  Completed stand pivot transfer into shower to 3-1 BSC using grab bar. He bathed seated on 3-1, assist for obtaining figure four position with R LE and hand over hand assist to use R UE to wash L arm. He returned to w/c to dress, requiring assist to thread R LE into pants leg. He stood at hemi walker with min-mod A and maintain dynamic standing balance with min A while pt pulling up pants.  He completed grooming tasks from w/c level at sink with supervision.  Practiced squat pivot transfer w/c <> toilet with use of grab bar, pt able to complete with close supervision following VCs for hand placement. He stood with hemi walker to complete clothing management, increased steadying assist required as due to bathroom set-up, therapist unable to stand on pt's R side to block knee. Pt left seated in w/c at end of session, all needs in reach.  Therapy Documentation Precautions:  Precautions Precautions: Fall Restrictions Weight Bearing Restrictions: No Pain:   No/ denies pain  See Function Navigator for Current Functional Status.   Therapy/Group: Individual Therapy  Lewis, Emrys Mceachron C 11/17/2016, 7:08 AM

## 2016-11-17 NOTE — Progress Notes (Signed)
Physical Therapy Session Note  Patient Details  Name: Eric Morton MRN: [id number] Date of Birth: December 31, 1965  Today's Date: 11/17/2016 PT Individual Time: 1100-1200 PT Individual Time Calculation (min): 60 min   Short Term Goals: Week 3:  PT Short Term Goal 1 (Week 3): STG = LTG due to ELOS  Skilled Therapeutic Interventions/Progress Updates: Pt presented in w/c agreeable to therapy. Pt propelled to rehab gym supervision, and performed squat pivot transfer to mat minA. Performed gait with HW x 24f modA, with pt requiring max verbal/tactile cues for R quad activation and safe placement with HW. Pt with intermittent poor sequencing/attempting to step with B knees flexed. Performed supine NMR forced use including bridges, SAQ, HS pulls with physioball, LTR with physioball, and R hip adduction (ball squeeze)x 10 ea. Performed trunk activation at mat including semi-sidelying and increased activation of care ms to return to midline. Pt returned to w/c in same manner as prior and propelled back to room supervision level. Pt remained in w/c at end of session with call bell within reach and needs met.      Therapy Documentation Precautions:  Precautions Precautions: Fall Restrictions Weight Bearing Restrictions: No General:   Vital Signs: Therapy Vitals Temp: (!) 97.5 F (36.4 C) Temp Source: Oral Pulse Rate: 71 Resp: 18 BP:  (RN Noitified Stacy) Patient Position (if appropriate): Sitting Oxygen Therapy SpO2: 100 % O2 Device: Not Delivered Pain:   Mobility:   Locomotion :    Trunk/Postural Assessment :    Balance:   Exercises:   Other Treatments:     See Function Navigator for Current Functional Status.   Therapy/Group: Individual Therapy  Sirenity Shew 11/17/2016, 4:25 PM

## 2016-11-17 NOTE — Progress Notes (Signed)
Speech Language Pathology Daily Session Note  Patient Details  Name: Eric Morton MRN: [id number] Date of Birth: [birth year]  Today's Date: 11/17/2016 SLP Individual Time: 0730-0800 SLP Individual Time Calculation (min): 30 min  Short Term Goals: Week 3: SLP Short Term Goal 1 (Week 3): Pt will utilize speech intelligibility strategies at word level with Max A verbal cues to acheive 50% intelligibility.  SLP Short Term Goal 2 (Week 3): Pt will demonstrate functional problem solving for mildly complex tasks with Supervision verbal cues. SLP Short Term Goal 3 (Week 3): Pt will consume dysphagia 2 with thin liquids and minimal overt s/s of aspiration with Min A to use compensatory swallow strategies.  SLP Short Term Goal 4 (Week 3): Pt will perform 3 sets of 5 reps of pharyngeal strengthening exercises with Max A cues.   Skilled Therapeutic Interventions: Skilled treatment session focused on dysphagia and speech communication goals. SLP facilitated session by providing skilled observation of pt consuming graham crackers and thin liquids. Pt with significant coughing likely d/t dry textures that became during consumption of graham crackers. Pt with little lingual manipulation - basically allowed graham crackers to melt in mouth. Pt with increased speech intelligibility at the spontaneous phrase level utterances. Pt was returned to room, left upright in wheelchair with all needs within reach. Continue per current plan of care.      Function:  Eating Eating   Modified Consistency Diet: Yes Eating Assist Level: Set up assist for;Supervision or verbal cues   Eating Set Up Assist For: Opening containers;Cutting food       Cognition Comprehension Comprehension assist level: Follows basic conversation/direction with extra time/assistive device  Expression Expression assistive device: Communication board Expression assist level: Set-up assist with assistive device  Social Interaction Social  Interaction assist level: Interacts appropriately with others with medication or extra time (anti-anxiety, antidepressant).  Problem Solving Problem solving assist level: Solves basic problems with no assist  Memory Memory assist level: Recognizes or recalls 90% of the time/requires cueing < 10% of the time    Pain    Therapy/Group: Individual Therapy  Sheniece Ruggles 11/17/2016, 9:06 AM

## 2016-11-17 NOTE — Progress Notes (Signed)
Physical Therapy Note  Patient Details  Name: HAIDYN CHADDERDON MRN: [id number] Date of Birth: [birth year] Today's Date: 11/17/2016  1005-1050, 45 min individual tx Pain:none per pt  PT donned TEDS.  Pt propelled w/c using hemi technique, on unit with supervision with min cues for route finding familiar locations.  Pt locks bil brakes with 1 cue; PT instructed him in managing R footrest by reaching between legs over to flange to swing away before transfers.    Neuor re-ed via forced use, multimodal cues for alternating reciprocal movement bil LEs seated in w/c, using Kinetron at level 40, x 25 cycles x 2, using RLE only against resistance of PT x 10 cycles x 3.  Stand/pivot transfer training to low sofa, using HW to L and R,with mod/max assist for RLE stance stability, mod cues for sequencing.  Pt left resting in w/c with all needs within reach.  See function navigator for current status.  Vernie Piet 11/17/2016, 10:29 AM

## 2016-11-17 NOTE — Progress Notes (Signed)
Subjective/Complaints:    Remains severely dysarthric , eating breakfast without coughing  ROS: Appears to denies CP, SOB, N/V/D.  Objective: Vital Signs: Blood pressure (!) 147/76, pulse 73, temperature 97.8 F (36.6 C), temperature source Oral, resp. rate 18, height _0  (1.702 m), weight 74.8 kg (165 lb), SpO2 100 %. No results found. Results for orders placed or performed during the hospital encounter of 10/27/16 (from the past 72 hour(s))  Glucose, capillary     Status: Abnormal   Collection Time: 11/14/16 12:24 PM  Result Value Ref Range   Glucose-Capillary 213 (H) 65 - 99 mg/dL  Glucose, capillary     Status: Abnormal   Collection Time: 11/14/16  5:43 PM  Result Value Ref Range   Glucose-Capillary 230 (H) 65 - 99 mg/dL  Glucose, capillary     Status: Abnormal   Collection Time: 11/14/16 10:00 PM  Result Value Ref Range   Glucose-Capillary 144 (H) 65 - 99 mg/dL   Comment 1 Notify RN   Glucose, capillary     Status: Abnormal   Collection Time: 11/15/16  6:26 AM  Result Value Ref Range   Glucose-Capillary 108 (H) 65 - 99 mg/dL  Glucose, capillary     Status: None   Collection Time: 11/15/16 11:43 AM  Result Value Ref Range   Glucose-Capillary 73 65 - 99 mg/dL   Comment 1 Notify RN   Glucose, capillary     Status: Abnormal   Collection Time: 11/15/16  5:09 PM  Result Value Ref Range   Glucose-Capillary 260 (H) 65 - 99 mg/dL  Glucose, capillary     Status: Abnormal   Collection Time: 11/15/16  9:01 PM  Result Value Ref Range   Glucose-Capillary 126 (H) 65 - 99 mg/dL  Glucose, capillary     Status: None   Collection Time: 11/16/16  6:46 AM  Result Value Ref Range   Glucose-Capillary 99 65 - 99 mg/dL  Glucose, capillary     Status: Abnormal   Collection Time: 11/16/16 12:26 PM  Result Value Ref Range   Glucose-Capillary 217 (H) 65 - 99 mg/dL  Glucose, capillary     Status: Abnormal   Collection Time: 11/16/16  4:53 PM  Result Value Ref Range   Glucose-Capillary  255 (H) 65 - 99 mg/dL   Comment 1 Notify RN   Glucose, capillary     Status: None   Collection Time: 11/16/16  9:14 PM  Result Value Ref Range   Glucose-Capillary 91 65 - 99 mg/dL  Creatinine, serum     Status: None   Collection Time: 11/17/16  6:11 AM  Result Value Ref Range   Creatinine, Ser 0.97 0.61 - 1.24 mg/dL   GFR calc non Af Amer >60 >60 mL/min   GFR calc Af Amer >60 >60 mL/min    Comment: (NOTE) The eGFR has been calculated using the CKD EPI equation. This calculation has not been validated in all clinical situations. eGFR's persistently <60 mL/min signify possible Chronic Kidney Disease.   Glucose, capillary     Status: Abnormal   Collection Time: 11/17/16  6:36 AM  Result Value Ref Range   Glucose-Capillary 102 (H) 65 - 99 mg/dL     HENT: Poor dentition. Normocephalic. Atraumatic. Eyes: exopthalmos, no ptosis,  EOMI Cardio: RRR and no JVD Resp: CTA B/L and unlabored GI: BS positive and ND Skin:   Intact. Warm and dry. Neuro: Motor 0/5 RUE (unchanged) Musc/Skel:  No edema. No tenderness in extremities. Gen NAD. Vital signs  reviewed.   Assessment/Plan: 1. Functional deficits secondary to Right hemiparesis , dysarthria, dysphagia which require 3+ hours per day of interdisciplinary therapy in a comprehensive inpatient rehab setting. Physiatrist is providing close team supervision and 24 hour management of active medical problems listed below. Physiatrist and rehab team continue to assess barriers to discharge/monitor patient progress toward functional and medical goals. FIM: Function - Bathing Bathing activity did not occur: Refused Position: Shower Body parts bathed by patient: Right arm, Chest, Abdomen, Front perineal area, Buttocks, Right upper leg, Left upper leg, Right lower leg, Left lower leg, Left arm Body parts bathed by helper: Back Assist Level: Touching or steadying assistance(Pt > 75%)  Function- Upper Body Dressing/Undressing What is the patient  wearing?: Pull over shirt/dress Pull over shirt/dress - Perfomed by patient: Thread/unthread right sleeve, Put head through opening, Thread/unthread left sleeve, Pull shirt over trunk Pull over shirt/dress - Perfomed by helper: Pull shirt over trunk Assist Level: More than reasonable time Function - Lower Body Dressing/Undressing What is the patient wearing?: Underwear, Pants, Non-skid slipper socks Position: Wheelchair/chair at sink Underwear - Performed by patient: Thread/unthread left underwear leg, Pull underwear up/down Underwear - Performed by helper: Thread/unthread right underwear leg Pants- Performed by patient: Thread/unthread left pants leg, Pull pants up/down Pants- Performed by helper: Thread/unthread right pants leg Non-skid slipper socks- Performed by patient: Don/doff right sock, Don/doff left sock Non-skid slipper socks- Performed by helper: Don/doff left sock, Don/doff right sock Assist for footwear: Supervision/touching assist Assist for lower body dressing: Touching or steadying assistance (Pt > 75%)  Function - Toileting Toileting steps completed by patient: Adjust clothing prior to toileting Toileting steps completed by helper: Adjust clothing prior to toileting, Performs perineal hygiene, Adjust clothing after toileting Toileting Assistive Devices: Grab bar or rail Assist level: Two helpers  Function - Air cabin crew transfer assistive device: Radio broadcast assistant lift: Stedy Assist level to toilet: 2 helpers Assist level from toilet: 2 helpers  Function - Chair/bed transfer Chair/bed transfer method: Squat pivot Chair/bed transfer assist level: Touching or steadying assistance (Pt > 75%) Chair/bed transfer assistive device: Walker ([place]) Chair/bed transfer details: Tactile cues for sequencing, Verbal cues for sequencing, Verbal cues for technique, Verbal cues for precautions/safety, Tactile cues for weight shifting  Function - Locomotion:  Wheelchair Will patient use wheelchair at discharge?: Yes Type: Manual Max wheelchair distance: 178f  Assist Level: Supervision or verbal cues Assist Level: Supervision or verbal cues Wheel 150 feet activity did not occur: Safety/medical concerns Assist Level: Supervision or verbal cues Turns around,maneuvers to table,bed, and toilet,negotiates 3% grade,maneuvers on rugs and over doorsills: No Function - Locomotion: Ambulation Assistive device: Walker-hemi Max distance: 323f Assist level: Maximal assist (Pt 25 - 49%) Assist level: Moderate assist (Pt 50 - 74%) Walk 50 feet with 2 turns activity did not occur: Safety/medical concerns Walk 150 feet activity did not occur: Safety/medical concerns Walk 10 feet on uneven surfaces activity did not occur: Safety/medical concerns  Function - Comprehension Comprehension: Auditory Comprehension assist level: Follows basic conversation/direction with extra time/assistive device  Function - Expression Expression: Verbal, Nonverbal Expression assistive device: Communication board Expression assist level: Set-up assist with assistive device  Function - Social Interaction Social Interaction assist level: Interacts appropriately with others with medication or extra time (anti-anxiety, antidepressant).  Function - Problem Solving Problem solving assist level: Solves basic problems with no assist  Function - Memory Memory assist level: Recognizes or recalls 90% of the time/requires cueing < 10% of the time Patient normally  able to recall (first 3 days only): Staff names and faces, That he or she is in a hospital   Medical Problem List and Plan: 1.  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  DVT Prophylaxis/Anticoagulation: Subcutaneous Lovenox. Venous Doppler studies  negative 3. Pain Management: Neurontin 300 mg daily at bedtime 4. Mood: Ego support 5. Neuropsych: This patient is capable of making decisions on his own behalf. 6. Skin/Wound Care:  Routine skin checks 7. Fluids/Electrolytes/Nutrition:  Routine I&Os 8. Dysphagia. D2 thin. Monitor hydration. Follow-up speech therapy  Advance diet as tolerated 9.Hypertension. Lisinopril increase to 78m on 10/12, l  Amlodipine increased to 536m10/17  Slightly labile, but overall controlled on 10/22 Vitals:   11/16/16 1210 11/17/16 0457  BP: (!) 110/58 (!) 147/76  Pulse: 75 73  Resp: 20 18  Temp: 98.2 F (36.8 C) 97.8 F (36.6 C)  SpO2: 99% 100%   10. Diabetes mellitus peripheral neuropathy. Hemoglobin A1c 8.9. SSI. Check blood sugars before meals and at bedtime. (PTA Patient on Glucotrol 5 mg twice a day, Glucophage 1000 mg twice a day). Will start Glucotrol 38m29mam, pm CBG remain elevated, may need ER formulation CBG (last 3)   Recent Labs  11/16/16 1653 11/16/16 2114 11/17/16 0636  GLUCAP 255* 91 102*   Labile, will cont to monitor, may require meal time coverage 11.Polysubstance abuse. UDS positive for cocaine. Provide counseling with Neuropsych 12.History polycythemia.   Labs stable 13. ?PBA: Monitor.  Will consider Neuropsych, improved, on sertraline 14.  Buccal surface laceration likely related to dyscoordinated oral movements, left side over salivary duct will schedule MMW 15.  Hypoalbuminemia- start prostat 16.  Pt c/o difficulty holding eyes open , no ptosis or EOM weakness, severe dysarthria and R hemiparesis obscures neuro exam, ACH-R WNL 17.  Exopthalmos- normal TSH  T4 results   LOS (Days) 21 A FACE TO FACE EVALUATION WAS PERFORMED  Analy Bassford E 11/17/2016, 8:17 AM

## 2016-11-18 ENCOUNTER — Inpatient Hospital Stay (HOSPITAL_COMMUNITY): Payer: No Typology Code available for payment source

## 2016-11-18 ENCOUNTER — Inpatient Hospital Stay (HOSPITAL_COMMUNITY): Payer: No Typology Code available for payment source | Admitting: Physical Therapy

## 2016-11-18 ENCOUNTER — Inpatient Hospital Stay (HOSPITAL_COMMUNITY): Payer: Medicaid Other | Admitting: Occupational Therapy

## 2016-11-18 ENCOUNTER — Other Ambulatory Visit: Payer: No Typology Code available for payment source

## 2016-11-18 LAB — GLUCOSE, CAPILLARY
Glucose-Capillary: 104 mg/dL — ABNORMAL HIGH (ref 65–99)
Glucose-Capillary: 125 mg/dL — ABNORMAL HIGH (ref 65–99)
Glucose-Capillary: 88 mg/dL (ref 65–99)
Glucose-Capillary: 164 mg/dL — ABNORMAL HIGH (ref 65–99)

## 2016-11-18 MED ORDER — GLIPIZIDE ER 5 MG PO TB24
5.0000 mg | ORAL_TABLET | Freq: Every day | ORAL | Status: DC
  Administered 2016-11-19 – 2016-11-25 (×7): 5 mg via ORAL
  Filled 2016-11-18 (×8): qty 1

## 2016-11-18 NOTE — Progress Notes (Signed)
Occupational Therapy Note  Patient Details  Name: Eric Morton MRN: [id number] Date of Birth: 1965-10-06  Today's Date: 11/18/2016 OT Individual Time: 2658-7184 OT Individual Time Calculation (min): 40 min    No c/o pain  Pt seen this session to facilitate trunk control and RUE.  Pt brought to gym and transferred to mat with min A. Sat unsupported for 35 min while working on: - RUE wt bearing on mat with side to side and forward reaching,  - R arm on large physioball with rolling ball in and out forward and to the side - B ball holds with total A to stabilize R hand on ball with rolling ball forward and to the side -table top slides with tapping facilitation over bicep - pt able to actively move elbow 20 degrees to move hand to the left on table with goal of knocking a cup over  Pt participated well and tolerated activities well. Pt transferred back to w/c and was taken back to room with all needs met.  [place] 11/18/2016, 1:25 PM

## 2016-11-18 NOTE — Progress Notes (Signed)
Occupational Therapy Session Note  Patient Details  Name: TERREON EKHOLM MRN: [id number] Date of Birth: 10-07-1965  Today's Date: 11/18/2016 OT Individual Time: 1130-1157 OT Individual Time Calculation (min): 27 min   Skilled Therapeutic Interventions/Progress Updates:    1:1. No pain reported. Pt propels w/c using hemi technique with supervision and VC for steering to avoid objects in hallway. Trial towel glides in standing with MOD A and knee blocking, however pt unable to maintain upright posture and balance impacting quality of movement. Pt completes 1x15 towel glides forward/back, horizontal, circles and diagonal with facilitation of R scapula. Exited session with pt seated in w/c with call light in reach and all needs met.   Therapy Documentation Precautions:  Precautions Precautions: Fall Restrictions Weight Bearing Restrictions: No  See Function Navigator for Current Functional Status.   Therapy/Group: Individual Therapy   Tonny Branch 11/18/2016, 7:57 AM

## 2016-11-18 NOTE — Progress Notes (Signed)
Speech Language Pathology Daily Session Note  Patient Details  Name: Eric Morton MRN: [id number] Date of Birth: [birth year]  Today's Date: 11/18/2016 SLP Individual Time: 1400-1500 SLP Individual Time Calculation (min): 60 min  Short Term Goals: Week 3: SLP Short Term Goal 1 (Week 3): Pt will utilize speech intelligibility strategies at word level with Max A verbal cues to acheive 50% intelligibility.  SLP Short Term Goal 2 (Week 3): Pt will demonstrate functional problem solving for mildly complex tasks with Supervision verbal cues. SLP Short Term Goal 3 (Week 3): Pt will consume dysphagia 2 with thin liquids and minimal overt s/s of aspiration with Min A to use compensatory swallow strategies.  SLP Short Term Goal 4 (Week 3): Pt will perform 3 sets of 5 reps of pharyngeal strengthening exercises with Max A cues.   Skilled Therapeutic Interventions:Skilled ST services focused on swallow and speech skills. SLP facilitated PO consumption of thin liquids via cup sips, pt demonstrated recall and demonstration of swallow precautions with supervision A question cues. SLP facilitated speech intelligibility at the word and phrase level utilizing picture cards for joint reference of topic, pt demonstrated intelligibility at phrase level with Max A verbal and visual cues. SLP instructed pt to utilize similar words, if orginally produced word is not clear and understood by listener and then to use communication board if needed to express effective message. SLP instructed pt in SLOP to aid in increased intelligibility at word and phrase level, SLP put visual aid up in room. Pt was left in room with call bell within reach. Recommend to continue ST services.      Function:  Eating Eating   Modified Consistency Diet: No (thin liquids) Eating Assist Level: Set up assist for;Supervision or verbal cues           Cognition Comprehension Comprehension assist level: Follows basic conversation/direction  with extra time/assistive device  Expression Expression assistive device: Communication board Expression assist level: Expresses basic 25 - 49% of the time/requires cueing 50 - 75% of the time. Uses single words/gestures.  Social Interaction Social Interaction assist level: Interacts appropriately with others with medication or extra time (anti-anxiety, antidepressant).  Problem Solving Problem solving assist level: Solves basic 90% of the time/requires cueing < 10% of the time  Memory Memory assist level: Recognizes or recalls 90% of the time/requires cueing < 10% of the time;Requires cues to use assistive device    Pain Pain Assessment Pain Assessment: No/denies pain  Therapy/Group: Individual Therapy  Acea Yagi  [facility] 11/18/2016, 3:59 PM

## 2016-11-18 NOTE — Progress Notes (Signed)
Social Work Patient ID: Eric Morton, male   DOB: [birth year], 51 y.o.   MRN: [id number] Have contacted area facilities regarding bed availability for pt. Not one has showed any interest in taking pt. Will expand search.

## 2016-11-18 NOTE — Progress Notes (Signed)
Physical Therapy Session Note  Patient Details  Name: Eric Morton MRN: [id number] Date of Birth: 18-Dec-1965  Today's Date: 11/18/2016 PT Individual Time: 1000-1100 PT Individual Time Calculation (min): 60 min   Short Term Goals: Week 3:  PT Short Term Goal 1 (Week 3): STG = LTG due to ELOS  Skilled Therapeutic Interventions/Progress Updates:  Pt presented in w/c agreeable to therapy. Propelled to day room supervision. Pt participated in [place] forced use RLE on Kinetron 5 min bouts at 70cm/sec and 50cm/sec. Cues to attempt to maintain neutral knee with extension. Standing balance activities using peg board at high/low table with cues to engage R quad and glutes. Pt required intermittent seated rests due to fatigue. Pt required minA due to visual difficulty (colors). Pt returned to room and remained in w/c at end of session with call bell within reach and needs met.      Therapy Documentation Precautions:  Precautions Precautions: Fall Restrictions Weight Bearing Restrictions: No General:   Vital Signs: Therapy Vitals Temp:  (Refused Vitals) Pain: Pain Assessment Pain Assessment: No/denies pain  See Function Navigator for Current Functional Status.   Therapy/Group: Individual Therapy  Alexianna Nachreiner  Phylicia Mcgaugh, PTA  11/18/2016, 4:31 PM

## 2016-11-18 NOTE — Progress Notes (Signed)
Subjective/Complaints:   No issues overnite, dysarthria slightly better today We discussed timecourse of motor recovery ROS: Appears to denies CP, SOB, N/V/D.  Objective: Vital Signs: Blood pressure 139/86, pulse 70, temperature 98.1 F (36.7 C), temperature source Oral, resp. rate 17, height '5\' 7"'  (1.702 m), weight 74.8 kg (165 lb), SpO2 100 %. No results found. Results for orders placed or performed during the hospital encounter of 10/27/16 (from the past 72 hour(s))  Glucose, capillary     Status: None   Collection Time: 11/15/16 11:43 AM  Result Value Ref Range   Glucose-Capillary 73 65 - 99 mg/dL   Comment 1 Notify RN   Glucose, capillary     Status: Abnormal   Collection Time: 11/15/16  5:09 PM  Result Value Ref Range   Glucose-Capillary 260 (H) 65 - 99 mg/dL  Glucose, capillary     Status: Abnormal   Collection Time: 11/15/16  9:01 PM  Result Value Ref Range   Glucose-Capillary 126 (H) 65 - 99 mg/dL  Glucose, capillary     Status: None   Collection Time: 11/16/16  6:46 AM  Result Value Ref Range   Glucose-Capillary 99 65 - 99 mg/dL  Glucose, capillary     Status: Abnormal   Collection Time: 11/16/16 12:26 PM  Result Value Ref Range   Glucose-Capillary 217 (H) 65 - 99 mg/dL  Glucose, capillary     Status: Abnormal   Collection Time: 11/16/16  4:53 PM  Result Value Ref Range   Glucose-Capillary 255 (H) 65 - 99 mg/dL   Comment 1 Notify RN   Glucose, capillary     Status: None   Collection Time: 11/16/16  9:14 PM  Result Value Ref Range   Glucose-Capillary 91 65 - 99 mg/dL  Creatinine, serum     Status: None   Collection Time: 11/17/16  6:11 AM  Result Value Ref Range   Creatinine, Ser 0.97 0.61 - 1.24 mg/dL   GFR calc non Af Amer >60 >60 mL/min   GFR calc Af Amer >60 >60 mL/min    Comment: (NOTE) The eGFR has been calculated using the CKD EPI equation. This calculation has not been validated in all clinical situations. eGFR's persistently <60 mL/min signify  possible Chronic Kidney Disease.   Glucose, capillary     Status: Abnormal   Collection Time: 11/17/16  6:36 AM  Result Value Ref Range   Glucose-Capillary 102 (H) 65 - 99 mg/dL  Glucose, capillary     Status: Abnormal   Collection Time: 11/17/16 12:16 PM  Result Value Ref Range   Glucose-Capillary 150 (H) 65 - 99 mg/dL   Comment 1 Notify RN   Glucose, capillary     Status: Abnormal   Collection Time: 11/17/16  5:00 PM  Result Value Ref Range   Glucose-Capillary 191 (H) 65 - 99 mg/dL   Comment 1 Notify RN   Glucose, capillary     Status: Abnormal   Collection Time: 11/17/16  9:20 PM  Result Value Ref Range   Glucose-Capillary 170 (H) 65 - 99 mg/dL   Comment 1 Notify RN      HENT: Poor dentition. Normocephalic. Atraumatic. Eyes: exopthalmos, no ptosis,  EOMI Cardio: RRR and no JVD Resp: CTA B/L and unlabored GI: BS positive and ND Skin:   Intact. Warm and dry. Neuro: Motor 1/5 RUE scap protraction and retraction, 3- R hip ext knee ext synergy Musc/Skel:  No edema. No tenderness in extremities. Gen NAD. Vital signs reviewed.   Assessment/Plan:  1. Functional deficits secondary to Right hemiparesis , dysarthria, dysphagia which require 3+ hours per day of interdisciplinary therapy in a comprehensive inpatient rehab setting. Physiatrist is providing close team supervision and 24 hour management of active medical problems listed below. Physiatrist and rehab team continue to assess barriers to discharge/monitor patient progress toward functional and medical goals. FIM: Function - Bathing Bathing activity did not occur: Refused Position: Shower Body parts bathed by patient: Right arm, Chest, Abdomen, Front perineal area, Buttocks, Right upper leg, Left upper leg, Right lower leg, Left lower leg, Left arm Body parts bathed by helper: Back Assist Level: Touching or steadying assistance(Pt > 75%)  Function- Upper Body Dressing/Undressing What is the patient wearing?: Pull over  shirt/dress Pull over shirt/dress - Perfomed by patient: Thread/unthread right sleeve, Put head through opening, Thread/unthread left sleeve, Pull shirt over trunk Pull over shirt/dress - Perfomed by helper: Pull shirt over trunk Assist Level: More than reasonable time Function - Lower Body Dressing/Undressing What is the patient wearing?: Underwear, Pants, Non-skid slipper socks Position: Wheelchair/chair at sink Underwear - Performed by patient: Thread/unthread left underwear leg, Pull underwear up/down Underwear - Performed by helper: Thread/unthread right underwear leg Pants- Performed by patient: Thread/unthread left pants leg Pants- Performed by helper: Thread/unthread right pants leg Non-skid slipper socks- Performed by patient: Don/doff right sock, Don/doff left sock Non-skid slipper socks- Performed by helper: Don/doff left sock, Don/doff right sock Assist for footwear: Supervision/touching assist Assist for lower body dressing: Touching or steadying assistance (Pt > 75%)  Function - Toileting Toileting steps completed by patient: Adjust clothing prior to toileting, Adjust clothing after toileting, Performs perineal hygiene Toileting steps completed by helper: Adjust clothing prior to toileting, Performs perineal hygiene, Adjust clothing after toileting Toileting Assistive Devices: Grab bar or rail Assist level: Touching or steadying assistance (Pt.75%)  Function - Air cabin crew transfer assistive device: Radio broadcast assistant lift: Stedy Assist level to toilet: Supervision or verbal cues Assist level from toilet: Supervision or verbal cues  Function - Chair/bed transfer Chair/bed transfer method: Stand pivot Chair/bed transfer assist level: Maximal assist (Pt 25 - 49%/lift and lower) Chair/bed transfer assistive device: Armrests, Bedrails Chair/bed transfer details: Verbal cues for technique, Manual facilitation for weight shifting, Verbal cues for  precautions/safety, Manual facilitation for placement  Function - Locomotion: Wheelchair Will patient use wheelchair at discharge?: Yes Type: Manual Max wheelchair distance: 150 Assist Level: Supervision or verbal cues Assist Level: Supervision or verbal cues Wheel 150 feet activity did not occur: Safety/medical concerns Assist Level: Supervision or verbal cues Turns around,maneuvers to table,bed, and toilet,negotiates 3% grade,maneuvers on rugs and over doorsills: Yes Function - Locomotion: Ambulation Assistive device: Walker-hemi Max distance: 28f  Assist level: Maximal assist (Pt 25 - 49%) Assist level: Moderate assist (Pt 50 - 74%) Walk 50 feet with 2 turns activity did not occur: Safety/medical concerns Walk 150 feet activity did not occur: Safety/medical concerns Walk 10 feet on uneven surfaces activity did not occur: Safety/medical concerns  Function - Comprehension Comprehension: Auditory Comprehension assist level: Follows basic conversation/direction with extra time/assistive device  Function - Expression Expression: Verbal, Nonverbal Expression assistive device: Communication board Expression assist level: Set-up assist with assistive device  Function - Social Interaction Social Interaction assist level: Interacts appropriately with others with medication or extra time (anti-anxiety, antidepressant).  Function - Problem Solving Problem solving assist level: Solves basic problems with no assist  Function - Memory Memory assist level: Recognizes or recalls 90% of the time/requires cueing < 10% of the  time, Requires cues to use assistive device Patient normally able to recall (first 3 days only): Staff names and faces, That he or she is in a hospital, Location of own room   Medical Problem List and Plan: 1.  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  DVT Prophylaxis/Anticoagulation: Subcutaneous Lovenox. Venous Doppler studies negative 3. Pain Management: Neurontin 300 mg daily at bedtime 4. Mood: Ego support 5. Neuropsych: This patient is capable of making decisions on his own behalf. 6. Skin/Wound Care:  Routine skin checks 7. Fluids/Electrolytes/Nutrition:  Routine I&Os 8. Dysphagia. D2 thin. Monitor hydration. Follow-up speech therapy  Advance diet as tolerated 9.Hypertension. Lisinopril increase to 40m on 10/12, l  Amlodipine increased to 5482m10/17  Slightly labile, but overall controlled on 10/23 Vitals:   11/17/16 1450 11/18/16 0450  BP: 136/66 139/86  Pulse: 71 70  Resp: 18 17  Temp: (!) 97.5 F (36.4 C) 98.1 F (36.7 C)  SpO2: 100% 100%   10. Diabetes mellitus peripheral neuropathy. Hemoglobin A1c 8.9. SSI. Check blood sugars before meals and at bedtime. (PTA Patient on Glucotrol 5 mg twice a day, Glucophage 1000 mg twice a day).  Glucotrol 82m57mam, pm CBG remain elevated,Change to  ER formulation CBG (last 3)   Recent Labs  11/17/16 1216 11/17/16 1700 11/17/16 2120  GLUCAP 150* 191* 170*   Labile, will cont to monitor, may require meal time coverage 11.Polysubstance abuse. UDS positive for cocaine. Provide counseling with Neuropsych 12.History polycythemia.   Labs stable 13. ?PBA: Monitor.  Will consider Neuropsych, improved, on sertraline 14.  Buccal surface laceration likely related to dyscoordinated oral movements, left side over salivary duct will schedule MMW 15.  Hypoalbuminemia- start prostat 16.  Pt c/o difficulty holding eyes open , no ptosis or EOM weakness, severe dysarthria and R hemiparesis obscures neuro exam, ACH-R WNL 17.  Exopthalmos- normal TSH  T4 results   LOS (Days) 22 A FACE TO FACE EVALUATION WAS PERFORMED  Sonika Levins E 11/18/2016, 8:24 AM

## 2016-11-19 ENCOUNTER — Inpatient Hospital Stay (HOSPITAL_COMMUNITY): Payer: No Typology Code available for payment source | Admitting: Physical Therapy

## 2016-11-19 ENCOUNTER — Inpatient Hospital Stay (HOSPITAL_COMMUNITY): Payer: Medicaid Other | Admitting: Speech Pathology

## 2016-11-19 ENCOUNTER — Encounter (HOSPITAL_COMMUNITY): Payer: No Typology Code available for payment source | Admitting: Psychology

## 2016-11-19 ENCOUNTER — Inpatient Hospital Stay (HOSPITAL_COMMUNITY): Payer: Medicaid Other | Admitting: Occupational Therapy

## 2016-11-19 LAB — GLUCOSE, CAPILLARY
GLUCOSE-CAPILLARY: 109 mg/dL — AB (ref 65–99)
GLUCOSE-CAPILLARY: 182 mg/dL — AB (ref 65–99)
Glucose-Capillary: 250 mg/dL — ABNORMAL HIGH (ref 65–99)
Glucose-Capillary: 116 mg/dL — ABNORMAL HIGH (ref 65–99)

## 2016-11-19 MED ORDER — SENNOSIDES-DOCUSATE SODIUM 8.6-50 MG PO TABS
1.0000 | ORAL_TABLET | Freq: Every evening | ORAL | Status: DC | PRN
  Administered 2016-11-22: 1 via ORAL
  Filled 2016-11-19: qty 1

## 2016-11-19 NOTE — Progress Notes (Signed)
Speech Language Pathology Daily Session Note  Patient Details  Name: Eric Morton MRN: [id number] Date of Birth: 01-30-1965  Today's Date: 11/19/2016 SLP Individual Time: 60-1445 SLP Individual Time Calculation (min): 60 min  Short Term Goals: Week 3: SLP Short Term Goal 1 (Week 3): Pt will utilize speech intelligibility strategies at word level with Max A verbal cues to acheive 50% intelligibility.  SLP Short Term Goal 2 (Week 3): Pt will demonstrate functional problem solving for mildly complex tasks with Supervision verbal cues. SLP Short Term Goal 3 (Week 3): Pt will consume dysphagia 2 with thin liquids and minimal overt s/s of aspiration with Min A to use compensatory swallow strategies.  SLP Short Term Goal 4 (Week 3): Pt will perform 3 sets of 5 reps of pharyngeal strengthening exercises with Max A cues.   Skilled Therapeutic Interventions: Skilled treatment session focused on cognition goals. SLP facilitated session by providing supervision cues to complete mental math with money. Pt also able to verbally express phrase level wants and needs with Min question cues. Pt with questions about eating bacon and hamburger. Education provided on lack of teeth and decreased oral motor ability. All questions answered to pt satisfaction. Continue per current plan of care.      Function:   Cognition Comprehension Comprehension assist level: Follows basic conversation/direction with extra time/assistive device  Expression Expression assistive device: Communication board Expression assist level: Expresses basic 25 - 49% of the time/requires cueing 50 - 75% of the time. Uses single words/gestures.  Social Interaction Social Interaction assist level: Interacts appropriately with others with medication or extra time (anti-anxiety, antidepressant).  Problem Solving Problem solving assist level: Solves basic 90% of the time/requires cueing < 10% of the time  Memory Memory assist level: Recognizes  or recalls 90% of the time/requires cueing < 10% of the time;Requires cues to use assistive device    Pain    Therapy/Group: Individual Therapy  Paizlie Klaus 11/19/2016, 3:41 PM

## 2016-11-19 NOTE — Patient Care Conference (Signed)
Inpatient RehabilitationTeam Conference and Plan of Care Update Date: 11/19/2016   Time: 10:50 AM    Patient Name: Eric Morton      Medical Record Number: [id number]  Date of Birth: 1965/12/18 Sex: Male         Room/Bed: 4W24C/4W24C-01 Payor Info: Payor: MEDICAID POTENTIAL / Plan: MEDICAID POTENTIAL / Product Type: *No Product type* /    Admitting Diagnosis: L CVA  Admit Date/Time:  10/27/2016  3:38 PM Admission Comments: No comment available   Primary Diagnosis:  Hemiparesis affecting right side as late effect of stroke (HCC) Principal Problem: Hemiparesis affecting right side as late effect of stroke [facility])  Patient Active Problem List   Diagnosis Date Noted  . Acute blood loss anemia   . Benign essential HTN   . Dysphagia, post-stroke   . Hemiparesis affecting right side as late effect of stroke ([place]) 10/29/2016  . Embolic cerebral infarction ([place]) 10/27/2016  . Alcohol use   . Cocaine use   . Dysarthria due to recent stroke   . Dysphagia due to recent stroke   . Left-sided weakness 10/23/2016  . Cerebral thrombosis with cerebral infarction 10/23/2016  . Aphasia   . Hyperlipidemia 07/27/2015  . Cerebrovascular accident (CVA) due to occlusion of cerebral artery ([place])   . Stroke ([place]) 03/21/2015  . Polycythemia vera ([place]) 03/21/2015  . Hx of adenomatous colonic polyps   . Dehydration   . Diarrhea   . Protein-calorie malnutrition ([place])   . Cellulitis of breast of male 09/25/2011  . HTN (hypertension) 09/25/2011  . DM (diabetes mellitus) ([place]) 09/25/2011  . Leukopenia 09/25/2011  . Alcohol abuse 09/25/2011  . Cocaine abuse ([place]) 09/25/2011  . Tobacco use 09/25/2011  . Neuropathy 09/25/2011    Expected Discharge Date: Expected Discharge Date:  (SNF)  Team Members Present: Physician leading conference: Dr. Alysia Penna Social Worker Present: Ovidio Kin, LCSW Nurse Present: Rayetta Pigg, RN PT Present: Barrie Folk, PT OT Present: Willeen Cass, OT SLP  Present: Stormy Fabian, SLP PPS Coordinator present : Daiva Nakayama, RN, CRRN     Current Status/Progress Goal Weekly Team Focus  Medical   constipation, severe dysarthria, thin liq, D2, no oral lesions  reduce fall risk , improve communication  maintain hydration po route   Bowel/Bladder   continent of bowel & bladder, LBM 11/16/16  Maintain continence,   continue to monitor & assist as needed   Swallow/Nutrition/ Hydration   Thin swallow strategies, trials of dys 3  min assist   utilizing swallow strategies    ADL's   min A overall for bathing and dressing sit to stand,   Min A Overall  standing balance, NMR, transfers, self care retraining    Mobility   min/modA stand pivot with HW, modA gait up to 56ft with HW,   Min-mod assist with gait for short distances. supervision-min assist transfers with LRAD.   R NMR, standing balance, gait   Communication   max assist for verbal expression - word/phrase level, supervision-Mod I use of communciation board  min assist   contineu to address speech intelligibility with visual reference, mod I communuication board   Safety/Cognition/ Behavioral Observations  Min-Supervision A   supervision  basic problem solving    Pain   no c/o pain  pain scale< 3   continue to assess & treat as needed   Skin   nio skin break down noted, non-pitting edema noted to RUE & RLE  no new areas of skin breakdown  assess  skin q shift      *See Care Plan and progress notes for long and short-term goals.     Barriers to Discharge  Current Status/Progress Possible Resolutions Date Resolved   Physician    Inaccessible home environment     Progressing   Cont rehab SNF      Nursing  Decreased caregiver support;Lack of/limited family support;Medication compliance               PT                    OT                  SLP                SW                Discharge Planning/Teaching Needs:  Searching for NH bed no offers as of yet. Medicaid and disability  applications taken last week. Make pt/family aware will not be placed locally      Team Discussion:  Making progress in therapies, currently at min-mod assist level. Speech feels can not upgrade diet to Dys 3 due to dental issues and oral movement, will DC on Dys 2 thin. Verbalizing more and using communication board. Cont B & B. Looking for NH bed with Medicaid pending  Revisions to Treatment Plan:  NHP    Continued Need for Acute Rehabilitation Level of Care: The patient requires daily medical management by a physician with specialized training in physical medicine and rehabilitation for the following conditions: Daily direction of a multidisciplinary physical rehabilitation program to ensure safe treatment while eliciting the highest outcome that is of practical value to the patient.: Yes Daily medical management of patient stability for increased activity during participation in an intensive rehabilitation regime.: Yes Daily analysis of laboratory values and/or radiology reports with any subsequent need for medication adjustment of medical intervention for : Neurological problems;Nutritional problems  Johanna Stafford, [place] Rhyme 11/19/2016, 1:05 PM

## 2016-11-19 NOTE — Progress Notes (Signed)
Occupational Therapy Session Note  Patient Details  Name: Eric Morton MRN: [id number] Date of Birth: Jun 24, 1965  Today's Date: 11/19/2016 OT Individual Time: 1850-1900 OT Individual Time Calculation (min): 10 min    Skilled Therapeutic Interventions/Progress Updates: patient stated he was too fatigued to participate in OT but he proceeded to build rapport with this clinician and share info regarding his vision and attempted to demonstrate that he worked on his right arm earlier really hard and was able to flex right elbow.   RN confirmed that she witnessed the patient demonstration earlier.   Pt stated that he would work more intently with this clinician during tomorrow's session.  Patient left with call bell within reach and communicating with his nurse.     Therapy Documentation Precautions:  Precautions Precautions: Fall Restrictions Weight Bearing Restrictions: No General: General OT Amount of Missed Time: 20 Minutes (20)   Pain:denied    Visionpatient spelled on his communication board that he has [place] and that it is difficult for him to see in the bright day light and that otherwise, he can see "a little."    Perception    Therapy/Group: Individual Therapy  Alfredia Ferguson [facility] 11/19/2016, 8:01 PM

## 2016-11-19 NOTE — Consult Note (Signed)
Neuropsychological Consultation   Patient:   Eric Morton   DOB:   July 29, 1965  MR Number:  [id number]  Location:  [facility] [address] [id number] [place] [address] Dept: [place]: [phone]           Date of Service:   11/19/2016  Start Time:   10 AM End Time:   11 AM  Provider/Observer:  Ilean Skill, Psy.D.       Clinical Neuropsychologist       Billing Code/Service: [phone] Units  Chief Complaint:    Eric Morton is a 51 year old male with history of polysubstance abuse and prior CVA in 2017 with right side residual deficits.  Patient presented on 10/23/2016 with right hemiparesis and dysarthria.  MRI showing multiple CVAs and numerous infarcts including a large left basal ganglia infarct.  Patient admitted to [facility] program.  The patient has struggled coping with dysarthria and near total loss of function of right arm and significant loss of function in right leg.  Reason for Service:  Eric Morton was referred for neuropsychological consultation due to coping and adjustment issues.  Below is the HPI for the current admission  HPI: Eric Morton a 51 y.o.right handedmalewith history of polysubstance abuse,documented CKD with creatinine 1.02, HTN,polycythemia vera, diabetes mellitus and prior CVA in 2017 and right side residual deficits. Per chart review, patient lives alone in an apartment connected to his aunt's home. Independent prior to admission. He has a cousin with good support. Presented on 10/23/16 with right hemiparesis and dysarthria. Work up revealed UDS + for cocaine. BP elevated into the 200'/100's requiring IV labetalol. CT cerebral perfusion scan did not detect for infarction. CT angiogram head and neck negative for emergent large vessel occlusion. No significant carotid or right vertebral artery stenosis in the neck despite atherosclerosis. 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 MRI of the brain reviewed, showing multiple CVAs. Per report, numerous infarcts including a large 18x89mm left basal ganglia infarct suggestive of an embolic etiology. Echocardiogram with ejection fraction of 25% grade 1 diastolic dysfunction. Bilateral lower extremity Dopplers negative. Pt on D1/honey thick liquid dit currently.  Currently maintained on aspirin and Plavix for CVA prophylaxis. Subcutaneous Lovenox for DVT prophylaxis. Hemoglobin A1c of 8.9.Patient was admitted for a comprehensive rehabilitation program  Current Status:  The patient has shown some mild improvement in his expressive language functioning. I was able to understand more of what he was trying to communicate today then prior discussions. The patient is relying more on repeating what he is saying versus the communication board. The patient's mood appears to be positive and other than worrying about not continuing to be able to get care and assistance in the future he is generally in a good mood without significant depression or anxiety type symptoms.  Behavioral Observation: Eric Morton  presents as a 51 y.o.-year-old Right African American Male who appeared his stated age. his dress was Appropriate and he was Well Groomed and his manners were Appropriate to the situation.  his participation was indicative of Appropriate and Attentive behaviors.  There were  physical disabilities noted.  he displayed an appropriate level of cooperation and motivation.     Interactions:    Active Appropriate and Attentive  Attention:   abnormal and attention span appeared shorter than expected for age  Memory:   abnormal; remote memory  intact, recent memory impaired  Visuo-spatial:  not examined  Speech (Volume):  normal  Speech:   garbled; slurred  Thought Process:  Coherent and Relevant  Though Content:  WNL; not suicidal  Orientation:   person, place, time/date and  situation  Judgment:   Poor  Planning:   Poor  Affect:    Appropriate  Mood:    Euthymic  Insight:   Fair  Intelligence:   normal  Medical History:   Past Medical History:  Diagnosis Date  . 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 Anxiety   . Chronic kidney disease    "jsut a little damage"  . Glaucoma   . History of stomach ulcers    not confirmed, never underwent diagnostic endo or radiology, this manifested as epigastric distress.   [name] Hx of adenomatous colonic polyps   . Hypercholesterolemia   . Hypertension   . Peripheral neuropathy   . Polycythemia vera ([place]) 2006  . Substance abuse ([place])   . Type II diabetes mellitus ([place])              Family Med/Psych History:  Family History  Problem Relation Age of Onset  . Diabetes Mother   . Hypertension Mother     Risk of Suicide/Violence: low Patient denies SI or HI  Impression/DX:  Eric Morton is a 51 year old male with history of polysubstance abuse and prior CVA in 2017 with right side residual deficits.  Patient presented on 10/23/2016 with right hemiparesis and dysarthria.  MRI showing multiple CVAs and numerous infarcts including a large left basal ganglia infarct.  Patient admitted to [facility] program.  The patient has struggled coping with dysarthria and near total loss of function of right arm and significant loss of function in right leg.  The patient has shown some mild improvement in his expressive language functioning. I was able to understand more of what he was trying to communicate today then prior discussions. The patient is relying more on repeating what he is saying versus the communication board. The patient's mood appears to be positive and other than worrying about not continuing to be able to get care and assistance in the future he is generally in a good mood without significant depression or anxiety type symptoms.   Diagnosis:    Cerebral infarction due to embolism of precerebral  artery [facility]) - Plan: Ambulatory referral to Physical Medicine Rehab         Electronically Signed   _______________________ Ilean Skill, Psy.D.

## 2016-11-19 NOTE — Progress Notes (Signed)
Physical Therapy Weekly Progress Note  Patient Details  Name: Eric Morton MRN: [id number] Date of Birth: [birth year]  Beginning of progress report period: November 12, 2016 End of progress report period: November 19, 2016  Today's Date: 11/19/2016 PT Individual Time: 0900-1000 PT Individual Time Calculation (min): 60 min   Patient has met 1 of 1 short term goals.  Pt continues to make slow but steady gains during course of therapy. Therapy session this week has focused more on sitting balance and core stabilization with pt making improvements in overall sitting balance. He continues to require minA with squat pivot transfers and modA with gait. He continues to have intermittent LOB in standing due to poor R quad and hip stabilization. He continues to benefit from skilled PT to improve safety with overall functional mobility.   Patient continues to demonstrate the following deficits muscle weakness and muscle paralysis, impaired timing and sequencing, abnormal tone, unbalanced muscle activation and decreased coordination and decreased attention, decreased awareness and decreased safety awareness and therefore will continue to benefit from skilled PT intervention to increase functional independence with mobility.  Patient progressing toward long term goals..  Continue plan of care.  PT Short Term Goals Week 3:  PT Short Term Goal 1 (Week 3): STG = LTG due to ELOS Week 4:  PT Short Term Goal 1 (Week 4): STG =LTG due to estimated discharge date.   Skilled Therapeutic Interventions/Progress Updates: Pt presented in w/c agreeable to therapy. Pt propelled w/c via hemi-technique to rehab gym. Performed stand pivot with HW modA with multimodal cues for R wt shift safe use of HW and R quad activation. Pt performed sit to/from stand x 4 with HW with cues for R quad/hip activation due to x 2 bouts of R knee buckling upon standing. Gait training 59f x 2 with HW with mod/max cues for sequencing, use of HW,  and postural control. Pt returned to room in same manner as prior and left with call bell within reach and needs met.      Therapy Documentation Precautions:  Precautions Precautions: Fall Restrictions Weight Bearing Restrictions: No General:   Vital Signs: Therapy Vitals Temp: (!) 97.5 F (36.4 C) Temp Source: Oral Pulse Rate: 81 Resp: 20 BP: 127/64 Patient Position (if appropriate): Sitting Oxygen Therapy SpO2: 100 % O2 Device: Not Delivered   See Function Navigator for Current Functional Status.  Therapy/Group: Individual Therapy  Rosita DeChalus  Rosita DeChalus, PTA  11/19/2016, 4:11 PM

## 2016-11-19 NOTE — Progress Notes (Signed)
Subjective/Complaints:  Working with PT, speech slightly louder and more intelligible but still severely dysarthric  ROS: Appears to denies CP, SOB, N/V/D.  Objective: Vital Signs: Blood pressure (!) 145/81, pulse 75, temperature 98.4 F (36.9 C), temperature source Oral, resp. rate 19, height 5' 7" (1.702 m), weight 74.8 kg (165 lb), SpO2 100 %. No results found. Results for orders placed or performed during the hospital encounter of 10/27/16 (from the past 72 hour(s))  Glucose, capillary     Status: Abnormal   Collection Time: 11/16/16 12:26 PM  Result Value Ref Range   Glucose-Capillary 217 (H) 65 - 99 mg/dL  Glucose, capillary     Status: Abnormal   Collection Time: 11/16/16  4:53 PM  Result Value Ref Range   Glucose-Capillary 255 (H) 65 - 99 mg/dL   Comment 1 Notify RN   Glucose, capillary     Status: None   Collection Time: 11/16/16  9:14 PM  Result Value Ref Range   Glucose-Capillary 91 65 - 99 mg/dL  Creatinine, serum     Status: None   Collection Time: 11/17/16  6:11 AM  Result Value Ref Range   Creatinine, Ser 0.97 0.61 - 1.24 mg/dL   GFR calc non Af Amer >60 >60 mL/min   GFR calc Af Amer >60 >60 mL/min    Comment: (NOTE) The eGFR has been calculated using the CKD EPI equation. This calculation has not been validated in all clinical situations. eGFR's persistently <60 mL/min signify possible Chronic Kidney Disease.   Glucose, capillary     Status: Abnormal   Collection Time: 11/17/16  6:36 AM  Result Value Ref Range   Glucose-Capillary 102 (H) 65 - 99 mg/dL  Glucose, capillary     Status: Abnormal   Collection Time: 11/17/16 12:16 PM  Result Value Ref Range   Glucose-Capillary 150 (H) 65 - 99 mg/dL   Comment 1 Notify RN   Glucose, capillary     Status: Abnormal   Collection Time: 11/17/16  5:00 PM  Result Value Ref Range   Glucose-Capillary 191 (H) 65 - 99 mg/dL   Comment 1 Notify RN   Glucose, capillary     Status: Abnormal   Collection Time: 11/17/16   9:20 PM  Result Value Ref Range   Glucose-Capillary 170 (H) 65 - 99 mg/dL   Comment 1 Notify RN   Glucose, capillary     Status: Abnormal   Collection Time: 11/18/16  8:30 AM  Result Value Ref Range   Glucose-Capillary 104 (H) 65 - 99 mg/dL  Glucose, capillary     Status: Abnormal   Collection Time: 11/18/16 12:05 PM  Result Value Ref Range   Glucose-Capillary 125 (H) 65 - 99 mg/dL   Comment 1 Notify RN   Glucose, capillary     Status: None   Collection Time: 11/18/16  4:51 PM  Result Value Ref Range   Glucose-Capillary 88 65 - 99 mg/dL   Comment 1 Notify RN   Glucose, capillary     Status: Abnormal   Collection Time: 11/18/16  9:28 PM  Result Value Ref Range   Glucose-Capillary 164 (H) 65 - 99 mg/dL  Glucose, capillary     Status: Abnormal   Collection Time: 11/19/16  6:22 AM  Result Value Ref Range   Glucose-Capillary 109 (H) 65 - 99 mg/dL   Comment 1 Notify RN      HENT: Poor dentition. Normocephalic. Atraumatic. Eyes: exopthalmos, no ptosis,  EOMI Cardio: RRR and no JVD Resp:  CTA B/L and unlabored GI: BS positive and ND Skin:   Intact. Warm and dry. Neuro: Motor 1/5 RUE scap protraction and retraction, 3- R hip ext knee ext synergy Musc/Skel:  No edema. No tenderness in extremities. Gen NAD. Vital signs reviewed.    Assessment/Plan: 1. Functional deficits secondary to Right hemiparesis , dysarthria, dysphagia which require 3+ hours per day of interdisciplinary therapy in a comprehensive inpatient rehab setting. Physiatrist is providing close team supervision and 24 hour management of active medical problems listed below. Physiatrist and rehab team continue to assess barriers to discharge/monitor patient progress toward functional and medical goals. FIM: Function - Bathing Bathing activity did not occur: Refused Position: Shower Body parts bathed by patient: Right arm, Chest, Abdomen, Front perineal area, Buttocks, Right upper leg, Left upper leg, Right lower leg,  Left lower leg, Left arm Body parts bathed by helper: Back Assist Level: Touching or steadying assistance(Pt > 75%)  Function- Upper Body Dressing/Undressing What is the patient wearing?: Pull over shirt/dress Pull over shirt/dress - Perfomed by patient: Thread/unthread right sleeve, Put head through opening, Thread/unthread left sleeve, Pull shirt over trunk Pull over shirt/dress - Perfomed by helper: Pull shirt over trunk Assist Level: More than reasonable time Function - Lower Body Dressing/Undressing What is the patient wearing?: Underwear, Pants, Non-skid slipper socks Position: Wheelchair/chair at sink Underwear - Performed by patient: Thread/unthread left underwear leg, Pull underwear up/down Underwear - Performed by helper: Thread/unthread right underwear leg Pants- Performed by patient: Thread/unthread left pants leg Pants- Performed by helper: Thread/unthread right pants leg Non-skid slipper socks- Performed by patient: Don/doff right sock, Don/doff left sock Non-skid slipper socks- Performed by helper: Don/doff left sock, Don/doff right sock Assist for footwear: Supervision/touching assist Assist for lower body dressing: Touching or steadying assistance (Pt > 75%)  Function - Toileting Toileting steps completed by patient: Adjust clothing prior to toileting, Adjust clothing after toileting, Performs perineal hygiene Toileting steps completed by helper: Adjust clothing prior to toileting, Performs perineal hygiene, Adjust clothing after toileting Toileting Assistive Devices: Grab bar or rail Assist level: Touching or steadying assistance (Pt.75%)  Function - Air cabin crew transfer assistive device: Radio broadcast assistant lift: Stedy Assist level to toilet: Supervision or verbal cues Assist level from toilet: Supervision or verbal cues  Function - Chair/bed transfer Chair/bed transfer method: Stand pivot Chair/bed transfer assist level: Maximal assist (Pt 25 -  49%/lift and lower) Chair/bed transfer assistive device: Armrests, Bedrails Chair/bed transfer details: Verbal cues for technique, Manual facilitation for weight shifting, Verbal cues for precautions/safety, Manual facilitation for placement  Function - Locomotion: Wheelchair Will patient use wheelchair at discharge?: Yes Type: Manual Max wheelchair distance: 150 Assist Level: Supervision or verbal cues Assist Level: Supervision or verbal cues Wheel 150 feet activity did not occur: Safety/medical concerns Assist Level: Supervision or verbal cues Turns around,maneuvers to table,bed, and toilet,negotiates 3% grade,maneuvers on rugs and over doorsills: Yes Function - Locomotion: Ambulation Assistive device: Walker-hemi Max distance: 4f  Assist level: Maximal assist (Pt 25 - 49%) Assist level: Moderate assist (Pt 50 - 74%) Walk 50 feet with 2 turns activity did not occur: Safety/medical concerns Walk 150 feet activity did not occur: Safety/medical concerns Walk 10 feet on uneven surfaces activity did not occur: Safety/medical concerns  Function - Comprehension Comprehension: Auditory Comprehension assist level: Follows basic conversation/direction with extra time/assistive device  Function - Expression Expression: Verbal, Nonverbal Expression assistive device: Communication board Expression assist level: Expresses basic 25 - 49% of the time/requires cueing 50 -  75% of the time. Uses single words/gestures.  Function - Social Interaction Social Interaction assist level: Interacts appropriately with others with medication or extra time (anti-anxiety, antidepressant).  Function - Problem Solving Problem solving assist level: Solves basic 90% of the time/requires cueing < 10% of the time  Function - Memory Memory assist level: Recognizes or recalls 90% of the time/requires cueing < 10% of the time, Requires cues to use assistive device Patient normally able to recall (first 3 days  only): Staff names and faces, That he or she is in a hospital, Location of own room   Medical Problem List and Plan: 1.  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 Formulized individual treatment plan based on medical history, underlying problem and comorbidities. 2.  DVT Prophylaxis/Anticoagulation: Subcutaneous Lovenox. Venous Doppler studies negative 3. Pain Management: Neurontin 300 mg daily at bedtime 4. Mood: Ego support 5. Neuropsych: This patient is capable of making decisions on his own behalf. 6. Skin/Wound Care:  Routine skin checks 7. Fluids/Electrolytes/Nutrition:  Routine I&Os 8. Dysphagia. D2 thin. Monitor hydration. Follow-up speech therapy  Advance diet as tolerated 9.Hypertension. Lisinopril increase to 10m on 10/12, l  Amlodipine increased to 538m10/17  Slightly labile, but overall controlled on 10/23 Vitals:   11/18/16 1600 11/19/16 0438  BP: 133/83 (!) 145/81  Pulse: 83 75  Resp: 20 19  Temp: 98.3 F (36.8 C) 98.4 F (36.9 C)  SpO2: 100% 100%   10. Diabetes mellitus peripheral neuropathy. Hemoglobin A1c 8.9. SSI. Check blood sugars before meals and at bedtime. (PTA Patient on Glucotrol 5 mg twice a day, Glucophage 1000 mg twice a day).  Glucotrol 50m71mam, pm CBG remain elevated,Change to  ER formulation CBG (last 3)   Recent Labs  11/18/16 1651 11/18/16 2128 11/19/16 0622  GLUCAP 88 164* 109*   Labile, will cont to monitor, may require meal time coverage 11.Polysubstance abuse. UDS positive for cocaine. Provide counseling with Neuropsych 12.History polycythemia.   Labs stable 13. ?PBA: Monitor.  Will consider Neuropsych, improved, on  sertraline 14.  Buccal surface laceration likely related to dyscoordinated oral movements, left side over salivary duct will schedule MMW 15.  Hypoalbuminemia- start prostat    LOS (Days) 23 A FACE TO FACE EVALUATION WAS PERFORMED  KIRSTEINS,ANDREW E 11/19/2016, 9:04 AM

## 2016-11-19 NOTE — Progress Notes (Signed)
Physical Therapy Session Note  Patient Details  Name: Eric Morton MRN: [id number] Date of Birth: 1965-05-21  Today's Date: 11/19/2016 PT Individual Time: 1300-1345 PT Individual Time Calculation (min): 45 min   Short Term Goals: Week 3:  PT Short Term Goal 1 (Week 3): STG = LTG due to ELOS  Skilled Therapeutic Interventions/Progress Updates:   Pt received sitting in WC and agreeable to PT  WC mobility instructed by PT with L hemi technique and supervision assist 233f+150ft. Min cues for posture and doorway management.   PT instructed pt in gait training x 238fwith HW and mod assist from PT. Pt noted to have constant LOB the R but able to correct with strong verbal cues from PT. PT also required to prevent adduction of the RLE with swing throughout gait.   Squat pivot transfers with min assist from PT throughout treatment to various surfaces. Moderate cues for sequencing and safety with transfer to the R and min cues for transfer to the L.   Nustep reciprocal movement training 2 bout 4 min +6 min. With RLE support on seccond bout to improved RLE positioning.   [facility] and all needs met.        Therapy Documentation Precautions:  Precautions Precautions: Fall Restrictions Weight Bearing Restrictions: No Vital Signs: Therapy Vitals Temp: (!) 97.5 F (36.4 C) Temp Source: Oral Pulse Rate: 81 Resp: 20 BP: 127/64 Patient Position (if appropriate): Sitting Oxygen Therapy SpO2: 100 % O2 Device: Not Delivered Pain: 0/10   See Function Navigator for Current Functional Status.   Therapy/Group: Individual Therapy  AuLorie Phenix0/24/2018, 2:46 PM

## 2016-11-20 ENCOUNTER — Inpatient Hospital Stay (HOSPITAL_COMMUNITY): Payer: No Typology Code available for payment source | Admitting: Physical Therapy

## 2016-11-20 ENCOUNTER — Inpatient Hospital Stay (HOSPITAL_COMMUNITY): Payer: Medicaid Other

## 2016-11-20 ENCOUNTER — Inpatient Hospital Stay (HOSPITAL_COMMUNITY): Payer: Medicaid Other | Admitting: Speech Pathology

## 2016-11-20 ENCOUNTER — Inpatient Hospital Stay (HOSPITAL_COMMUNITY): Payer: No Typology Code available for payment source | Admitting: Occupational Therapy

## 2016-11-20 LAB — GLUCOSE, CAPILLARY
GLUCOSE-CAPILLARY: 139 mg/dL — AB (ref 65–99)
Glucose-Capillary: 91 mg/dL (ref 65–99)
Glucose-Capillary: 186 mg/dL — ABNORMAL HIGH (ref 65–99)
Glucose-Capillary: 168 mg/dL — ABNORMAL HIGH (ref 65–99)

## 2016-11-20 NOTE — Progress Notes (Signed)
Occupational Therapy Session Note  Patient Details  Name: Eric Morton MRN: [id number] Date of Birth: 1965/08/20  Today's Date: 11/20/2016 OT Individual Time: 1030-1145 OT Individual Time Calculation (min): 75 min    Short Term Goals: Week 3:  OT Short Term Goal 1 (Week 3): Pt will thread B LEs into pants with supervision OT Short Term Goal 1 - Progress (Week 3): Not met OT Short Term Goal 2 (Week 3): Pt will complete 2/3 toileting tasks with mod A standing balance OT Short Term Goal 2 - Progress (Week 3): Met OT Short Term Goal 3 (Week 3): Pt will complete squat pivot transfer to toilet with close supervision OT Short Term Goal 3 - Progress (Week 3): Met OT Short Term Goal 4 (Week 3): Pt will use R UE with use of bathmit during bathing task with mod A OT Short Term Goal 4 - Progress (Week 3): Not met  Skilled Therapeutic Interventions/Progress Updates: patient participated in skilled OT bathing and dresing today as follows:  He utilized his communication board for expressive communication when needed  W/c to/fr 3:1 (drop arm) in room shower transfer with close S stand pivot  UB bathing and dressing= min A due to hemi right UE LB bathing Min A and he reached through the opening in the 3:1 to cleanse and rinse periarea and buttocks LB dressing = moderate assistance to don undies, pants, shoes and socks over L LE   Used right UE for bathing tasks with max to total A  At end of session, patient was seated near bed with call bell in place     Therapy Documentation Precautions:  Precautions Precautions: Fall Restrictions Weight Bearing Restrictions: No Pain: Pain Assessment Pain Assessment: No/denies pain  See Function Navigator for Current Functional Status.   Therapy/Group: Individual Therapy  Alfredia Ferguson [facility] 11/20/2016, 1:35 PM

## 2016-11-20 NOTE — Progress Notes (Signed)
Social Work Patient ID: Eric Morton, male   DOB: 1965/07/06, 51 y.o.   MRN: [id number]  Spoke with [name]-sister to inform no bed offers locally have expanded the search and pt could be placed anywhere in the state due to lack of insurance. She was not pleased with this and will contact Medicaid worker to see if application can be pushed through. She voiced: " No one will be able to see him then."  Have informed pt who is not happy and not wanting to go to a NH now. He continues to require care, sister to talk with him about this. Both aware ready to go at anytime once bed is offered.

## 2016-11-20 NOTE — Progress Notes (Signed)
Subjective/Complaints:  Lying in bed. Asking to get up to w/c, perseverating on it in fact  ROS: limited due to language/communication   Objective: Vital Signs: Blood pressure (!) 153/86, pulse 72, temperature 97.8 F (36.6 C), temperature source Oral, resp. rate 17, height 5\' 7"  (1.702 m), weight 74.8 kg (165 lb), SpO2 99 %. No results found. Results for orders placed or performed during the hospital encounter of 10/27/16 (from the past 72 hour(s))  Glucose, capillary     Status: Abnormal   Collection Time: 11/17/16 12:16 PM  Result Value Ref Range   Glucose-Capillary 150 (H) 65 - 99 mg/dL   Comment 1 Notify RN   Glucose, capillary     Status: Abnormal   Collection Time: 11/17/16  5:00 PM  Result Value Ref Range   Glucose-Capillary 191 (H) 65 - 99 mg/dL   Comment 1 Notify RN   Glucose, capillary     Status: Abnormal   Collection Time: 11/17/16  9:20 PM  Result Value Ref Range   Glucose-Capillary 170 (H) 65 - 99 mg/dL   Comment 1 Notify RN   Glucose, capillary     Status: Abnormal   Collection Time: 11/18/16  8:30 AM  Result Value Ref Range   Glucose-Capillary 104 (H) 65 - 99 mg/dL  Glucose, capillary     Status: Abnormal   Collection Time: 11/18/16 12:05 PM  Result Value Ref Range   Glucose-Capillary 125 (H) 65 - 99 mg/dL   Comment 1 Notify RN   Glucose, capillary     Status: None   Collection Time: 11/18/16  4:51 PM  Result Value Ref Range   Glucose-Capillary 88 65 - 99 mg/dL   Comment 1 Notify RN   Glucose, capillary     Status: Abnormal   Collection Time: 11/18/16  9:28 PM  Result Value Ref Range   Glucose-Capillary 164 (H) 65 - 99 mg/dL  Glucose, capillary     Status: Abnormal   Collection Time: 11/19/16  6:22 AM  Result Value Ref Range   Glucose-Capillary 109 (H) 65 - 99 mg/dL   Comment 1 Notify RN   Glucose, capillary     Status: Abnormal   Collection Time: 11/19/16 11:32 AM  Result Value Ref Range   Glucose-Capillary 250 (H) 65 - 99 mg/dL  Glucose,  capillary     Status: Abnormal   Collection Time: 11/19/16  4:35 PM  Result Value Ref Range   Glucose-Capillary 182 (H) 65 - 99 mg/dL  Glucose, capillary     Status: Abnormal   Collection Time: 11/19/16  9:02 PM  Result Value Ref Range   Glucose-Capillary 116 (H) 65 - 99 mg/dL  Glucose, capillary     Status: None   Collection Time: 11/20/16  6:42 AM  Result Value Ref Range   Glucose-Capillary 91 65 - 99 mg/dL   Comment 1 Notify RN      HENT: Poor dentition. Normocephalic. Atraumatic. Eyes: exopthalmos, no ptosis,  EOMI Cardio: RRR without murmur. No JVD  Resp: CTA Bilaterally without wheezes or rales. Normal effort  GI: BS positive and ND Skin:   Intact. Warm and dry. Neuro: Motor tr -1/5 RUE scap protraction and retraction, 3- R hip ext knee ext synergy--inconsistent -expressive language deficits, perseverates Musc/Skel:  No edema. Carin Hock NAD    Assessment/Plan: 1. Functional deficits secondary to Right hemiparesis , dysarthria, dysphagia which require 3+ hours per day of interdisciplinary therapy in a comprehensive inpatient rehab setting. Physiatrist is providing close team supervision  and 24 hour management of active medical problems listed below. Physiatrist and rehab team continue to assess barriers to discharge/monitor patient progress toward functional and medical goals. FIM: Function - Bathing Bathing activity did not occur: Refused Position: Shower Body parts bathed by patient: Right arm, Chest, Abdomen, Front perineal area, Buttocks, Right upper leg, Left upper leg, Right lower leg, Left lower leg, Left arm Body parts bathed by helper: Back Assist Level: Touching or steadying assistance(Pt > 75%)  Function- Upper Body Dressing/Undressing What is the patient wearing?: Pull over shirt/dress Pull over shirt/dress - Perfomed by patient: Thread/unthread right sleeve, Put head through opening, Thread/unthread left sleeve, Pull shirt over trunk Pull over shirt/dress -  Perfomed by helper: Pull shirt over trunk Assist Level: More than reasonable time Function - Lower Body Dressing/Undressing What is the patient wearing?: Underwear, Pants, Non-skid slipper socks Position: Wheelchair/chair at sink Underwear - Performed by patient: Thread/unthread left underwear leg, Pull underwear up/down Underwear - Performed by helper: Thread/unthread right underwear leg Pants- Performed by patient: Thread/unthread left pants leg Pants- Performed by helper: Thread/unthread right pants leg Non-skid slipper socks- Performed by patient: Don/doff right sock, Don/doff left sock Non-skid slipper socks- Performed by helper: Don/doff left sock, Don/doff right sock Assist for footwear: Supervision/touching assist Assist for lower body dressing: Touching or steadying assistance (Pt > 75%)  Function - Toileting Toileting steps completed by patient: Adjust clothing prior to toileting, Adjust clothing after toileting, Performs perineal hygiene Toileting steps completed by helper: Adjust clothing prior to toileting, Performs perineal hygiene, Adjust clothing after toileting Toileting Assistive Devices: Grab bar or rail Assist level: Touching or steadying assistance (Pt.75%)  Function - Air cabin crew transfer assistive device: Radio broadcast assistant lift: Stedy Assist level to toilet: Supervision or verbal cues Assist level from toilet: Supervision or verbal cues  Function - Chair/bed transfer Chair/bed transfer method: Stand pivot Chair/bed transfer assist level: Maximal assist (Pt 25 - 49%/lift and lower) Chair/bed transfer assistive device: Armrests, Bedrails Chair/bed transfer details: Verbal cues for technique, Manual facilitation for weight shifting, Verbal cues for precautions/safety, Manual facilitation for placement  Function - Locomotion: Wheelchair Will patient use wheelchair at discharge?: Yes Type: Manual Max wheelchair distance: 150 Assist Level:  Supervision or verbal cues Assist Level: Supervision or verbal cues Wheel 150 feet activity did not occur: Safety/medical concerns Assist Level: Supervision or verbal cues Turns around,maneuvers to table,bed, and toilet,negotiates 3% grade,maneuvers on rugs and over doorsills: Yes Function - Locomotion: Ambulation Assistive device: Walker-hemi Max distance: 57ft  Assist level: Maximal assist (Pt 25 - 49%) Assist level: Moderate assist (Pt 50 - 74%) Walk 50 feet with 2 turns activity did not occur: Safety/medical concerns Walk 150 feet activity did not occur: Safety/medical concerns Walk 10 feet on uneven surfaces activity did not occur: Safety/medical concerns  Function - Comprehension Comprehension: Auditory Comprehension assist level: Follows basic conversation/direction with extra time/assistive device  Function - Expression Expression: Verbal, Nonverbal Expression assistive device: Communication board Expression assist level: Expresses basic 25 - 49% of the time/requires cueing 50 - 75% of the time. Uses single words/gestures.  Function - Social Interaction Social Interaction assist level: Interacts appropriately with others with medication or extra time (anti-anxiety, antidepressant).  Function - Problem Solving Problem solving assist level: Solves basic 90% of the time/requires cueing < 10% of the time  Function - Memory Memory assist level: Recognizes or recalls 90% of the time/requires cueing < 10% of the time, Requires cues to use assistive device Patient normally able to recall (  first 3 days only): Staff names and faces, That he or she is in a hospital, Location of own room, Current season   Medical Problem List and Plan: 1.  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  DVT  Prophylaxis/Anticoagulation: Subcutaneous Lovenox. Venous Doppler studies negative 3. Pain Management: Neurontin 300 mg daily at bedtime 4. Mood: Ego support 5. Neuropsych: This patient is capable of making decisions on his own behalf. 6. Skin/Wound Care:  Routine skin checks 7. Fluids/Electrolytes/Nutrition:  Routine I&Os 8. Dysphagia. D2 thin. Monitor hydration. Follow-up speech therapy  Advance diet as tolerated 9.Hypertension. Lisinopril increased to 20mg  on 10/12, l  Amlodipine increased to 5mg  10/17  Slightly labile, but overall controlled on 10/25 Vitals:   11/19/16 1430 11/20/16 0353  BP: 127/64 (!) 153/86  Pulse:  72  Resp:  17  Temp:  97.8 F (36.6 C)  SpO2:  99%   10. Diabetes mellitus peripheral neuropathy. Hemoglobin A1c 8.9. SSI. Check blood sugars before meals and at bedtime. (PTA Patient on Glucotrol 5 mg twice a day, Glucophage 1000 mg twice a day).  Glucotrol 5mg  qam, pm CBG remain elevated,Changed to  ER formulation CBG (last 3)   Recent Labs  11/19/16 1635 11/19/16 2102 11/20/16 0642  GLUCAP 182* 116* 91   Showing some improvement. Follow for pattern  11.Polysubstance abuse. UDS positive for cocaine. Provide counseling with Neuropsych 12.History polycythemia.   Labs stable 13. ?PBA: Monitor.  Will consider Neuropsych, improved, on sertraline 14.  Buccal surface laceration likely related to dyscoordinated oral movements, left side over salivary duct: MMW 15.  Hypoalbuminemia-  prostat    LOS (Days) 24 A FACE TO FACE EVALUATION WAS PERFORMED  Hanad Leino T 11/20/2016, 9:12 AM

## 2016-11-20 NOTE — Progress Notes (Signed)
Physical Therapy Session Note  Patient Details  Name: Eric Morton MRN: [id number] Date of Birth: 09/04/1965  Today's Date: 11/20/2016 PT Individual Time: 0900-1000 PT Individual Time Calculation (min): 60 min   Short Term Goals: Week 4:  PT Short Term Goal 1 (Week 4): STG =LTG due to estimated discharge date.   Skilled Therapeutic Interventions/Progress Updates:   Pt received sitting in WC and agreeable to PT.   PT assisted pt to don shoes with total assist for time management. WC mobility with hemi technique to Rehab gym.   Squat pivot transfer training to the R and L. Sit<>supine with min assist to supine and mod assist to sitting.   Supine NMR with sheet to reduce friction on the RLE, and AROM on the LE.  Bridges, Heel slides, SAQ, hip abduction, ankle DF/PF, all completed x 10 BLE with moderate cues from PT to reduce compensation through trunk.  RUE shoulder abduction/adduction to 90deg, elbow flexion x 5, tricep extension x 10.   Rolling with PNF dynamic reversals to improved trunk activation on the R 2x 5.   Sitting trunk control with lateral reach outside BOS to force use of the R trunk and stabilize the pelvis.   Stair training with mod assist from PT to ascend and descend 4 steps. Mod cues from PT for improved glute and quad control to prevent knee collapse.   Patient returned to room and left sitting in WC with call bell in reach and all needs met.          Therapy Documentation Precautions:  Precautions Precautions: Fall Restrictions Weight Bearing Restrictions: No Pain: 0/10   See Function Navigator for Current Functional Status.   Therapy/Group: Individual Therapy  Austin E Tucker 11/20/2016, 10:07 AM  

## 2016-11-20 NOTE — Progress Notes (Signed)
Speech Language Pathology Weekly Progress and Session Note  Patient Details  Name: Eric Morton MRN: [id number] Date of Birth: [birth year]  Beginning of progress report period: November 13, 2016 End of progress report period: November 20, 2016  Today's Date: 11/20/2016 SLP Individual Time: 0700-0745 SLP Individual Time Calculation (min): 45 min  Short Term Goals: Week 3: SLP Short Term Goal 1 (Week 3): Pt will utilize speech intelligibility strategies at word level with Max A verbal cues to acheive 50% intelligibility.  SLP Short Term Goal 1 - Progress (Week 3): Met SLP Short Term Goal 2 (Week 3): Pt will demonstrate functional problem solving for mildly complex tasks with Supervision verbal cues. SLP Short Term Goal 2 - Progress (Week 3): Progressing toward goal SLP Short Term Goal 3 (Week 3): Pt will consume dysphagia 2 with thin liquids and minimal overt s/s of aspiration with Min A to use compensatory swallow strategies.  SLP Short Term Goal 3 - Progress (Week 3): Met SLP Short Term Goal 4 (Week 3): Pt will perform 3 sets of 5 reps of pharyngeal strengthening exercises with Max A cues.  SLP Short Term Goal 4 - Progress (Week 3): Not met    New Short Term Goals: Week 4: SLP Short Term Goal 1 (Week 4): Pt will utilize speech intelligibility strategies at word level with Mod A verbal cues to acheive 50% intelligibility.  SLP Short Term Goal 2 (Week 4): Pt will demonstrate functional problem solving for mildly complex tasks with Supervision verbal cues. SLP Short Term Goal 3 (Week 4): Pt will consume dysphagia 2 with thin liquids and minimal overt s/s of aspiration with supervision cues to use compensatory swallow strategies.  SLP Short Term Goal 4 (Week 4): Pt will perform 3 sets of 5 reps of pharyngeal strengthening exercises with Max A cues.   Weekly Progress Updates: Pt with progress over reporting period and has met 2 out of 4 STGs. Pt with progress in the areas of swallowing and  speech intelligibility. Pt with increased speech intelligibility at the word to simple phrase level for known topics and with familiar listeners. Pt continues to require skilled ST to target further diet advancement and increased speech intelligibility. Anticipate that pt will require skilled ST at discharge and further follow up care at [facility]      Intensity: Minumum of 1-2 x/day, 30 to 90 minutes Frequency:   Duration/Length of Stay: 2.5-3 weeks Treatment/Interventions: Cueing hierarchy;Functional tasks;Dysphagia/aspiration precaution training;Patient/family education;Speech/Language facilitation;Cognitive remediation/compensation   Daily Session  Skilled Therapeutic Interventions: Skilled treatment session focused on cognition goals. SLP facilitated session by attempting to wake pt. Pt able to communicate that he wanted to sleep longer. Pt with inability to problem solve that ST services needed to occur. SLP returned and pt continued to express  Delay and desire to sleep. After several attempts, SLP made aware that pt is waiting for nurse to wake him. It's his personal routine. Pt left in bed, bed alarm on and all needs withtin reach. Continue per current plan of care.       Function:     Cognition Comprehension Comprehension assist level: Follows basic conversation/direction with extra time/assistive device  Expression Expression assistive device: Communication board Expression assist level: Expresses basic 25 - 49% of the time/requires cueing 50 - 75% of the time. Uses single words/gestures.  Social Interaction Social Interaction assist level: Interacts appropriately with others with medication or extra time (anti-anxiety, antidepressant).  Problem Solving Problem solving assist level: Solves basic 90% of  the time/requires cueing < 10% of the time  Memory Memory assist level: Recognizes or recalls 90% of the time/requires cueing < 10% of the time;Requires cues to use assistive device    General    Pain Pain Assessment Pain Assessment: No/denies pain  Therapy/Group: Individual Therapy  Lovenia Debruler 11/20/2016, 7:12 AM

## 2016-11-20 NOTE — Progress Notes (Signed)
Social Work Patient ID: Eric Morton, male   DOB: May 29, 1965, 51 y.o.   MRN: [id number]  Met with pt to discuss team conference progress in therapies and expanding the NH bed search. Pt doesn't want to go outside of [place] due to no one will visit him. Discussed to come up with a plan with his sister. Sister reports Hulda Marin has her own substance abuse issues and can not assist pt. Will look for NH bed and then pt will need to decide what he plans to do when one is found.

## 2016-11-20 NOTE — Progress Notes (Signed)
Physical Therapy Session Note  Patient Details  Name: DEAGAN SEVIN MRN: [id number] Date of Birth: 01-Sep-1965  Today's Date: 11/20/2016 PT Individual Time: 6384-6659 PT Individual Time Calculation (min): 29 min   Short Term Goals: Week 4:  PT Short Term Goal 1 (Week 4): STG =LTG due to estimated discharge date.   Skilled Therapeutic Interventions/Progress Updates:    Session focused on w/c mobility with hemitechnique on unit and through obstacles with supervision and cues for efficient technique. During transfers, focused on pt managing RLE while PT assist with legrest management and pt locking/unlocking brakes to prepare for transfer. Simulated car transfer with stand pivot technique with overall mod assist and requires assist with RLE in/out of car. Attempted to trial medium R AFO in new shoes but shoes are too tight and unable to get pt's foot into the shoe successfully. Pt utilized Data processing manager when difficulty with communication occurred successfully throughout session.   Therapy Documentation Precautions:  Precautions Precautions: Fall Restrictions Weight Bearing Restrictions: No  Pain: Pain Assessment Pain Assessment: No/denies pain   See Function Navigator for Current Functional Status.   Therapy/Group: Individual Therapy  Canary Brim Ivory Broad, PT, DPT  11/20/2016, 1:56 PM

## 2016-11-21 ENCOUNTER — Inpatient Hospital Stay (HOSPITAL_COMMUNITY): Payer: Medicaid Other | Admitting: Speech Pathology

## 2016-11-21 ENCOUNTER — Inpatient Hospital Stay (HOSPITAL_COMMUNITY): Payer: No Typology Code available for payment source

## 2016-11-21 ENCOUNTER — Inpatient Hospital Stay (HOSPITAL_COMMUNITY): Payer: No Typology Code available for payment source | Admitting: Physical Therapy

## 2016-11-21 LAB — GLUCOSE, CAPILLARY
GLUCOSE-CAPILLARY: 93 mg/dL (ref 65–99)
GLUCOSE-CAPILLARY: 181 mg/dL — AB (ref 65–99)
Glucose-Capillary: 301 mg/dL — ABNORMAL HIGH (ref 65–99)
Glucose-Capillary: 258 mg/dL — ABNORMAL HIGH (ref 65–99)

## 2016-11-21 NOTE — Progress Notes (Signed)
Subjective/Complaints:  Up in chair. Happy that he's seeing some improvement in LUE. Denies pain  ROS: pt denies nausea, vomiting, diarrhea, cough, shortness of breath or chest pain    Objective: Vital Signs: Blood pressure 139/84, pulse 70, temperature 98.3 F (36.8 C), temperature source Oral, resp. rate 18, height 5\' 7"  (1.702 m), weight 74.8 kg (165 lb), SpO2 100 %. No results found. Results for orders placed or performed during the hospital encounter of 10/27/16 (from the past 72 hour(s))  Glucose, capillary     Status: Abnormal   Collection Time: 11/18/16 12:05 PM  Result Value Ref Range   Glucose-Capillary 125 (H) 65 - 99 mg/dL   Comment 1 Notify RN   Glucose, capillary     Status: None   Collection Time: 11/18/16  4:51 PM  Result Value Ref Range   Glucose-Capillary 88 65 - 99 mg/dL   Comment 1 Notify RN   Glucose, capillary     Status: Abnormal   Collection Time: 11/18/16  9:28 PM  Result Value Ref Range   Glucose-Capillary 164 (H) 65 - 99 mg/dL  Glucose, capillary     Status: Abnormal   Collection Time: 11/19/16  6:22 AM  Result Value Ref Range   Glucose-Capillary 109 (H) 65 - 99 mg/dL   Comment 1 Notify RN   Glucose, capillary     Status: Abnormal   Collection Time: 11/19/16 11:32 AM  Result Value Ref Range   Glucose-Capillary 250 (H) 65 - 99 mg/dL  Glucose, capillary     Status: Abnormal   Collection Time: 11/19/16  4:35 PM  Result Value Ref Range   Glucose-Capillary 182 (H) 65 - 99 mg/dL  Glucose, capillary     Status: Abnormal   Collection Time: 11/19/16  9:02 PM  Result Value Ref Range   Glucose-Capillary 116 (H) 65 - 99 mg/dL  Glucose, capillary     Status: None   Collection Time: 11/20/16  6:42 AM  Result Value Ref Range   Glucose-Capillary 91 65 - 99 mg/dL   Comment 1 Notify RN   Glucose, capillary     Status: Abnormal   Collection Time: 11/20/16 11:57 AM  Result Value Ref Range   Glucose-Capillary 139 (H) 65 - 99 mg/dL  Glucose, capillary      Status: Abnormal   Collection Time: 11/20/16  4:36 PM  Result Value Ref Range   Glucose-Capillary 186 (H) 65 - 99 mg/dL  Glucose, capillary     Status: Abnormal   Collection Time: 11/20/16  9:10 PM  Result Value Ref Range   Glucose-Capillary 168 (H) 65 - 99 mg/dL  Glucose, capillary     Status: None   Collection Time: 11/21/16  6:37 AM  Result Value Ref Range   Glucose-Capillary 93 65 - 99 mg/dL   Comment 1 Notify RN      HENT: Poor dentition. Normocephalic. Atraumatic. Eyes: exopthalmos, no ptosis,  EOMI Cardio: RRR without murmur. No JVD   Resp: CTA Bilaterally without wheezes or rales. Normal effort  GI: BS positive and ND Skin:   Intact. Warm and dry. Neuro: Motor tr -1/5 RUE scap protraction and retraction, 3- R hip ext knee ext synergy--inconsistent -expressive language deficits, very dysarthric. Able to use communication board Musc/Skel:  No edema. Carin Hock NAD    Assessment/Plan: 1. Functional deficits secondary to Right hemiparesis , dysarthria, dysphagia which require 3+ hours per day of interdisciplinary therapy in a comprehensive inpatient rehab setting. Physiatrist is providing close team supervision and  24 hour management of active medical problems listed below. Physiatrist and rehab team continue to assess barriers to discharge/monitor patient progress toward functional and medical goals. FIM: Function - Bathing Bathing activity did not occur: Refused Position: Other (comment) (on3:1 in room shower) Body parts bathed by patient: Left arm, Chest, Abdomen, Front perineal area, Buttocks, Right upper leg, Left upper leg, Left lower leg Body parts bathed by helper: Right arm, Right lower leg, Back Assist Level: Touching or steadying assistance(Pt > 75%)  Function- Upper Body Dressing/Undressing What is the patient wearing?: Pull over shirt/dress Pull over shirt/dress - Perfomed by patient: Thread/unthread right sleeve, Put head through opening, Pull shirt over  trunk Pull over shirt/dress - Perfomed by helper: Thread/unthread left sleeve Assist Level: More than reasonable time Function - Lower Body Dressing/Undressing What is the patient wearing?: Underwear, Pants, Socks, Shoes Position: Wheelchair/chair at [employer] - Performed by patient: Thread/unthread right underwear leg Underwear - Performed by helper: Thread/unthread left underwear leg, Pull underwear up/down Pants- Performed by patient: Thread/unthread right pants leg Pants- Performed by helper: Thread/unthread left pants leg, Pull pants up/down Non-skid slipper socks- Performed by patient: Don/doff right sock, Don/doff left sock Non-skid slipper socks- Performed by helper: Don/doff left sock, Don/doff right sock Socks - Performed by helper: Don/doff right sock, Don/doff left sock Shoes - Performed by patient: Don/doff right shoe Shoes - Performed by helper: Don/doff left shoe Assist for footwear: Supervision/touching assist Assist for lower body dressing: Touching or steadying assistance (Pt > 75%)  Function - Toileting Toileting steps completed by patient: Adjust clothing prior to toileting, Adjust clothing after toileting, Performs perineal hygiene Toileting steps completed by helper: Adjust clothing prior to toileting, Performs perineal hygiene, Adjust clothing after toileting Toileting Assistive Devices: Grab bar or rail Assist level: Touching or steadying assistance (Pt.75%)  Function - Air cabin crew transfer assistive device: Grab bar Mechanical lift: Stedy Assist level to toilet: Supervision or verbal cues Assist level from toilet: Supervision or verbal cues  Function - Chair/bed transfer Chair/bed transfer method: Stand pivot Chair/bed transfer assist level: Maximal assist (Pt 25 - 49%/lift and lower) Chair/bed transfer assistive device: Armrests, Bedrails Chair/bed transfer details: Verbal cues for technique, Manual facilitation for weight shifting, Verbal  cues for precautions/safety, Manual facilitation for placement  Function - Locomotion: Wheelchair Will patient use wheelchair at discharge?: Yes Type: Manual Max wheelchair distance: 150 Assist Level: Supervision or verbal cues Assist Level: Supervision or verbal cues Wheel 150 feet activity did not occur: Safety/medical concerns Assist Level: Supervision or verbal cues Turns around,maneuvers to table,bed, and toilet,negotiates 3% grade,maneuvers on rugs and over doorsills: Yes Function - Locomotion: Ambulation Assistive device: Walker-hemi Max distance: 54ft  Assist level: Moderate assist (Pt 50 - 74%) Assist level: Moderate assist (Pt 50 - 74%) Walk 50 feet with 2 turns activity did not occur: Safety/medical concerns Walk 150 feet activity did not occur: Safety/medical concerns Walk 10 feet on uneven surfaces activity did not occur: Safety/medical concerns  Function - Comprehension Comprehension: Auditory Comprehension assist level: Follows basic conversation/direction with extra time/assistive device  Function - Expression Expression: Verbal, Nonverbal Expression assistive device: Communication board Expression assist level: Expresses basic 25 - 49% of the time/requires cueing 50 - 75% of the time. Uses single words/gestures.  Function - Social Interaction Social Interaction assist level: Interacts appropriately with others with medication or extra time (anti-anxiety, antidepressant).  Function - Problem Solving Problem solving assist level: Solves basic 90% of the time/requires cueing < 10% of the time  Function -  Memory Memory assist level: Recognizes or recalls 90% of the time/requires cueing < 10% of the time Patient normally able to recall (first 3 days only): Staff names and faces, That he or she is in a hospital, Location of own room, Current season   Medical Problem List and Plan: 1.  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  DVT Prophylaxis/Anticoagulation: Subcutaneous Lovenox. Venous Doppler studies negative 3. Pain Management: Neurontin 300 mg daily at bedtime 4. Mood: Ego support 5. Neuropsych: This patient is capable of making decisions on his own behalf. 6. Skin/Wound Care:  Routine skin checks 7. Fluids/Electrolytes/Nutrition:  Routine I&Os 8. Dysphagia. D2 thin. Monitor hydration. Follow-up speech therapy  Advance diet as tolerated 9.Hypertension. Lisinopril increased to 20mg  on 10/12,   Amlodipine increased to 5mg  10/17  Improved control Vitals:   11/20/16 1412 11/21/16 0424  BP: 136/70 139/84  Pulse: 84 70  Resp: 20 18  Temp: 97.6 F (36.4 C) 98.3 F (36.8 C)  SpO2: 100% 100%   10. Diabetes mellitus peripheral neuropathy. Hemoglobin A1c 8.9. SSI. Check blood sugars before meals and at bedtime. (PTA Patient on Glucotrol 5 mg twice a day, Glucophage 1000 mg twice a day).  Glucotrol 5mg  qam, pm CBG remain elevated,Changed to  ER formulation CBG (last 3)   Recent Labs  11/20/16 1636 11/20/16 2110 11/21/16 0637  GLUCAP 186* 168* 93   Much improved. No changes at this point  -dietary education 11.Polysubstance abuse. UDS positive for cocaine. Provide counseling with Neuropsych 12.History polycythemia.   Labs stable 13. ?PBA: Monitor.  Will consider Neuropsych, improved on sertraline 14.  Buccal surface laceration likely related to dyscoordinated oral movements, left side over salivary duct: MMW 15.  Hypoalbuminemia-  prostat    LOS (Days) 25 A FACE TO FACE EVALUATION WAS PERFORMED  SWARTZ,ZACHARY T 11/21/2016, 9:51 AM

## 2016-11-21 NOTE — Progress Notes (Signed)
Physical Therapy Session Note  Patient Details  Name: Eric Morton MRN: [id number] Date of Birth: 20-Aug-1965  Today's Date: 11/21/2016 PT Individual Time: 0900-1000 PT Individual Time Calculation (min): 60 min   Short Term Goals: Week 4:  PT Short Term Goal 1 (Week 4): STG =LTG due to estimated discharge date.   Skilled Therapeutic Interventions/Progress Updates:      Pt received sitting in WC and agreeable to PT  WC mobility to rehab gym. Pt required mulitple cues for obstacle awareness on the R. Dynamic WC mobility through obstacle course to weave through 6 cones x 2 with moderate cues for improved attention to the R, progressed to only min cues and noted improvement in visual scanning.   Transfer training to r and L with min assist from PT. Only min cues required for set up and sequencing.   Gait training x 34ft with mod assist from PT and R Ue support on HW due to intermittent R knee instability.   Bed mobility with supervision assist and moderate cues for safety and positioning of the RUE for sit<>supine and rolling R and L. Cues for safety awareness of improved positioning of the RUE to prevent shoulder injury.   Supine NMR. SAQ 2x10. Bridges x 6.   Standing NMR for reciprocal foot tapping on flat target on the floor. Mod assist for facilitation of hip and knee extension as well improved awareness of LOB and righting reactions.       Therapy Documentation Precautions:  Precautions Precautions: Fall Restrictions Weight Bearing Restrictions: No    Pain: Pain Assessment Pain Assessment: No/denies pain   See Function Navigator for Current Functional Status.   Therapy/Group: Individual Therapy  Lorie Phenix 11/21/2016, 10:35 AM

## 2016-11-21 NOTE — Progress Notes (Signed)
Occupational Therapy Session Note  Patient Details  Name: Eric Morton MRN: [id number] Date of Birth: 12/18/1965  Today's Date: 11/21/2016 OT Individual Time: 1100-1157 OT Individual Time Calculation (min): 57 min and 27 min   Short Term Goals: Week 4:  OT Short Term Goal 1 (Week 4): STG=LTG as awaiting SNF placement  Skilled Therapeutic Interventions/Progress Updates:    1;1. Pt declines bathing and dressing this session. Pt propels w/c to gym with increased time and intermittent min A for steering around objects in hallway. Pt w/c<>EOM with MOD A for balance when stepping to weaker side and MIN A to strong side. Pt completes sitting balance activity of obtaining horseshoes by reaching laterally and crossing midline with LUE. Pt reuires MIN A reaching to R outside BOS for sitting balance. Pt sit to stand with min-mod A from mat with Vc/tactile cues for R knee extension, hip extension and lifting chest in front of mirror for visual feedback. Pt completes 2x15 forward/back, horizontal in B direction and elbow flexion with RUE on UE ranger eliminating gravity and OT supporting elbow. Pt able to completes full AROMwith gravity eliminated of elbow flex/ext on UE ranger. Exited session with pt seated in w/c with call light in reach and all needs met.   1;1. 27 min. Pt propels w/c to tx destination for general conditioning and completes stand pivot transfers with hemi walker with MOD A and VC for weight shifting/side stepping. Pt sits EOM with supervision-CGA for reaching outside BOS in B directions with LUE to obtain playing cards and match on vertical board in front of patient for improved sitting balance and postural control. Exited session with pt seated in w/c with call light in reach and all needs met.  Therapy Documentation Precautions:  Precautions Precautions: Fall Restrictions Weight Bearing Restrictions: No  See Function Navigator for Current Functional Status.   Therapy/Group:  Individual Therapy  Tonny Branch 11/21/2016, 6:45 AM

## 2016-11-21 NOTE — Progress Notes (Signed)
Speech Language Pathology Daily Session Note  Patient Details  Name: Eric Morton MRN: [id number] Date of Birth: 1965-04-22  Today's Date: 11/21/2016 SLP Individual Time: 4854-6270 SLP Individual Time Calculation (min): 45 min  Short Term Goals: Week 4: SLP Short Term Goal 1 (Week 4): Pt will utilize speech intelligibility strategies at word level with Mod A verbal cues to acheive 50% intelligibility.  SLP Short Term Goal 2 (Week 4): Pt will demonstrate functional problem solving for mildly complex tasks with Supervision verbal cues. SLP Short Term Goal 3 (Week 4): Pt will consume dysphagia 2 with thin liquids and minimal overt s/s of aspiration with supervision cues to use compensatory swallow strategies.  SLP Short Term Goal 4 (Week 4): Pt will perform 3 sets of 5 reps of pharyngeal strengthening exercises with Max A cues.   Skilled Therapeutic Interventions: Skilled treatment session focused on dysphagia and cognition goals. SLP facilitated session by providing skilled observation of pt consuming peanut butter graham crackers. Pt with increased oral manipulation of bolus. Included in diet order for pt to consume graham crackers for snack. Pt able to request nursing to call in another order for french toast and he was able to problem solve and direct his care. Pt was intelligible at the word level for ~ 75% with self-correcting repetitive cues. Pt was returned to room, left upright in wheelchair with all needs within reach. Continue per current plan of care.      Function:  Eating Eating   Modified Consistency Diet: No Eating Assist Level: Set up assist for;Supervision or verbal cues   Eating Set Up Assist For: Opening containers;Cutting food       Cognition Comprehension Comprehension assist level: Follows basic conversation/direction with extra time/assistive device  Expression Expression assistive device: Communication board Expression assist level: Expresses basic 25 - 49% of  the time/requires cueing 50 - 75% of the time. Uses single words/gestures.  Social Interaction Social Interaction assist level: Interacts appropriately with others with medication or extra time (anti-anxiety, antidepressant).  Problem Solving Problem solving assist level: Solves basic 90% of the time/requires cueing < 10% of the time  Memory Memory assist level: Recognizes or recalls 90% of the time/requires cueing < 10% of the time    Pain    Therapy/Group: Individual Therapy  Ashby Leflore 11/21/2016, 8:35 AM

## 2016-11-22 ENCOUNTER — Inpatient Hospital Stay (HOSPITAL_COMMUNITY): Payer: No Typology Code available for payment source | Admitting: Physical Therapy

## 2016-11-22 LAB — GLUCOSE, CAPILLARY
GLUCOSE-CAPILLARY: 69 mg/dL (ref 65–99)
Glucose-Capillary: 227 mg/dL — ABNORMAL HIGH (ref 65–99)
Glucose-Capillary: 189 mg/dL — ABNORMAL HIGH (ref 65–99)
Glucose-Capillary: 156 mg/dL — ABNORMAL HIGH (ref 65–99)

## 2016-11-22 NOTE — Progress Notes (Signed)
Subjective/Complaints:  Lying in bed. No new complaints. Demonstrating that his right arm is moving more  ROS: limited due to language/communication    Objective: Vital Signs: Blood pressure 137/86, pulse 66, temperature 98.1 F (36.7 C), temperature source Oral, resp. rate 18, height 5\' 7"  (1.702 m), weight 74.8 kg (165 lb), SpO2 100 %. No results found. Results for orders placed or performed during the hospital encounter of 10/27/16 (from the past 72 hour(s))  Glucose, capillary     Status: Abnormal   Collection Time: 11/19/16 11:32 AM  Result Value Ref Range   Glucose-Capillary 250 (H) 65 - 99 mg/dL  Glucose, capillary     Status: Abnormal   Collection Time: 11/19/16  4:35 PM  Result Value Ref Range   Glucose-Capillary 182 (H) 65 - 99 mg/dL  Glucose, capillary     Status: Abnormal   Collection Time: 11/19/16  9:02 PM  Result Value Ref Range   Glucose-Capillary 116 (H) 65 - 99 mg/dL  Glucose, capillary     Status: None   Collection Time: 11/20/16  6:42 AM  Result Value Ref Range   Glucose-Capillary 91 65 - 99 mg/dL   Comment 1 Notify RN   Glucose, capillary     Status: Abnormal   Collection Time: 11/20/16 11:57 AM  Result Value Ref Range   Glucose-Capillary 139 (H) 65 - 99 mg/dL  Glucose, capillary     Status: Abnormal   Collection Time: 11/20/16  4:36 PM  Result Value Ref Range   Glucose-Capillary 186 (H) 65 - 99 mg/dL  Glucose, capillary     Status: Abnormal   Collection Time: 11/20/16  9:10 PM  Result Value Ref Range   Glucose-Capillary 168 (H) 65 - 99 mg/dL  Glucose, capillary     Status: None   Collection Time: 11/21/16  6:37 AM  Result Value Ref Range   Glucose-Capillary 93 65 - 99 mg/dL   Comment 1 Notify RN   Glucose, capillary     Status: Abnormal   Collection Time: 11/21/16 12:15 PM  Result Value Ref Range   Glucose-Capillary 301 (H) 65 - 99 mg/dL  Glucose, capillary     Status: Abnormal   Collection Time: 11/21/16  5:12 PM  Result Value Ref Range   Glucose-Capillary 181 (H) 65 - 99 mg/dL  Glucose, capillary     Status: Abnormal   Collection Time: 11/21/16  8:56 PM  Result Value Ref Range   Glucose-Capillary 258 (H) 65 - 99 mg/dL  Glucose, capillary     Status: None   Collection Time: 11/22/16  6:45 AM  Result Value Ref Range   Glucose-Capillary 69 65 - 99 mg/dL   Comment 1 Notify RN      HENT: Poor dentition. Normocephalic. Atraumatic. Eyes: exopthalmos, no ptosis,  EOMI Cardio: RRR without murmur. No JVD   Resp: CTA Bilaterally without wheezes or rales. Normal effort  GI: BS positive and ND Skin:   Intact. Warm and dry. Neuro: Motor tr -1/5 RUE scap protraction and retraction, 3- R hip ext knee ext synergy--no changes -expressive language deficits, very dysarthric. Able to use communication board Musc/Skel:  No edema. Carin Hock NAD    Assessment/Plan: 1. Functional deficits secondary to Right hemiparesis , dysarthria, dysphagia which require 3+ hours per day of interdisciplinary therapy in a comprehensive inpatient rehab setting. Physiatrist is providing close team supervision and 24 hour management of active medical problems listed below. Physiatrist and rehab team continue to assess barriers to discharge/monitor patient progress  toward functional and medical goals. FIM: Function - Bathing Bathing activity did not occur: Refused Position: Other (comment) (on3:1 in room shower) Body parts bathed by patient: Left arm, Chest, Abdomen, Front perineal area, Buttocks, Right upper leg, Left upper leg, Left lower leg Body parts bathed by helper: Right arm, Right lower leg, Back Assist Level: Touching or steadying assistance(Pt > 75%)  Function- Upper Body Dressing/Undressing What is the patient wearing?: Pull over shirt/dress Pull over shirt/dress - Perfomed by patient: Thread/unthread right sleeve, Put head through opening, Pull shirt over trunk Pull over shirt/dress - Perfomed by helper: Thread/unthread left sleeve Assist Level:  More than reasonable time Function - Lower Body Dressing/Undressing What is the patient wearing?: Underwear, Pants, Socks, Shoes Position: Wheelchair/chair at [employer] - Performed by patient: Thread/unthread right underwear leg Underwear - Performed by helper: Thread/unthread left underwear leg, Pull underwear up/down Pants- Performed by patient: Thread/unthread right pants leg Pants- Performed by helper: Thread/unthread left pants leg, Pull pants up/down Non-skid slipper socks- Performed by patient: Don/doff right sock, Don/doff left sock Non-skid slipper socks- Performed by helper: Don/doff left sock, Don/doff right sock Socks - Performed by helper: Don/doff right sock, Don/doff left sock Shoes - Performed by patient: Don/doff right shoe Shoes - Performed by helper: Don/doff left shoe Assist for footwear: Supervision/touching assist Assist for lower body dressing: Touching or steadying assistance (Pt > 75%)  Function - Toileting Toileting steps completed by patient: Adjust clothing prior to toileting, Adjust clothing after toileting, Performs perineal hygiene Toileting steps completed by helper: Adjust clothing prior to toileting, Performs perineal hygiene, Adjust clothing after toileting Toileting Assistive Devices: Grab bar or rail Assist level: Touching or steadying assistance (Pt.75%)  Function - Air cabin crew transfer assistive device: Grab bar Mechanical lift: Stedy Assist level to toilet: Supervision or verbal cues Assist level from toilet: Supervision or verbal cues  Function - Chair/bed transfer Chair/bed transfer method: Stand pivot Chair/bed transfer assist level: Touching or steadying assistance (Pt > 75%) Chair/bed transfer assistive device: Armrests, Bedrails Chair/bed transfer details: Verbal cues for technique, Manual facilitation for weight shifting, Verbal cues for precautions/safety, Manual facilitation for placement  Function - Locomotion:  Wheelchair Will patient use wheelchair at discharge?: Yes Type: Manual Max wheelchair distance: 150 Assist Level: Supervision or verbal cues Assist Level: Supervision or verbal cues Wheel 150 feet activity did not occur: Safety/medical concerns Assist Level: Supervision or verbal cues Turns around,maneuvers to table,bed, and toilet,negotiates 3% grade,maneuvers on rugs and over doorsills: Yes Function - Locomotion: Ambulation Assistive device: Walker-hemi Max distance: 52ft  Assist level: Moderate assist (Pt 50 - 74%) Assist level: Moderate assist (Pt 50 - 74%) Walk 50 feet with 2 turns activity did not occur: Safety/medical concerns Walk 150 feet activity did not occur: Safety/medical concerns Walk 10 feet on uneven surfaces activity did not occur: Safety/medical concerns  Function - Comprehension Comprehension: Auditory Comprehension assist level: Follows basic conversation/direction with extra time/assistive device  Function - Expression Expression: Verbal, Nonverbal Expression assistive device: Communication board Expression assist level: Expresses basic 25 - 49% of the time/requires cueing 50 - 75% of the time. Uses single words/gestures.  Function - Social Interaction Social Interaction assist level: Interacts appropriately with others with medication or extra time (anti-anxiety, antidepressant).  Function - Problem Solving Problem solving assist level: Solves basic 90% of the time/requires cueing < 10% of the time  Function - Memory Memory assist level: Recognizes or recalls 90% of the time/requires cueing < 10% of the time Patient normally able to  recall (first 3 days only): Staff names and faces, That he or she is in a hospital, Location of own room, Current season   Medical Problem List and Plan: 1.  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  DVT Prophylaxis/Anticoagulation: Subcutaneous Lovenox. Venous Doppler studies negative 3. Pain Management: Neurontin 300 mg daily at bedtime 4. Mood: Ego support 5. Neuropsych: This patient is capable of making decisions on his own behalf. 6. Skin/Wound Care:  Routine skin checks 7. Fluids/Electrolytes/Nutrition:  Routine I&Os 8. Dysphagia. D2 thin. Monitor hydration. Follow-up speech therapy  Advance diet as tolerated 9.Hypertension. Lisinopril increased to 20mg  on 10/12,   Amlodipine increased to 5mg  10/17  Improved control overall Vitals:   11/21/16 1454 11/22/16 0626  BP: 130/74 137/86  Pulse: 85 66  Resp: 20 18  Temp: 98.2 F (36.8 C) 98.1 F (36.7 C)  SpO2: 100% 100%   10. Diabetes mellitus peripheral neuropathy. Hemoglobin A1c 8.9. SSI. Check blood sugars before meals and at bedtime. (PTA Patient on Glucotrol 5 mg twice a day, Glucophage 1000 mg twice a day).  Glucotrol 5mg  qam, pm CBG remain elevated,Changed to  ER formulation CBG (last 3)   Recent Labs  11/21/16 1712 11/21/16 2056 11/22/16 0645  GLUCAP 181* 258* 69   Much improved. No changes at this point  -dietary education 11.Polysubstance abuse. UDS positive for cocaine. Provide counseling with Neuropsych 12.History polycythemia.   Labs stable 13. ?PBA: Monitor.  Will consider Neuropsych, improved on sertraline 14.  Buccal surface laceration likely related to dyscoordinated oral movements, left side over salivary duct: MMW 15.  Hypoalbuminemia-  prostat    LOS (Days) 26 A FACE TO FACE EVALUATION WAS PERFORMED  Trafton Roker T 11/22/2016, 9:44 AM

## 2016-11-22 NOTE — Progress Notes (Signed)
Physical Therapy Session Note  Patient Details  Name: TOMOTHY EDDINS MRN: [id number] Date of Birth: 11/08/1965  Today's Date: 11/22/2016 PT Individual Time: 1015-1045 PT Individual Time Calculation (min): 30 min   Short Term Goals: Week 3:  PT Short Term Goal 1 (Week 3): STG = LTG due to ELOS  Skilled Therapeutic Interventions/Progress Updates:    Pt received sitting in WC and agreeable to PT. PT transported pt to day room.   Nustep reciprocal movement pattern training level 2 x 8 min with BLE and R hip adduction support. Pt noted to have improved symmetry movement on this day.  Gait training with HW and 2 different AFO on the R LE x 10 ft for each trial. Vaying degrees of stability to improve RLE knee control in stance in each AFO.  No change in knee or gluteal control with either AFO compared to gait with no AD.   Patient returned to room and left sitting in [facility] with call bell in reach and all needs met.         Therapy Documentation Precautions:  Precautions Precautions: Fall Restrictions Weight Bearing Restrictions: No Pain: 0/10   See Function Navigator for Current Functional Status.   Therapy/Group: Individual Therapy  Lorie Phenix 11/22/2016, 1:36 PM

## 2016-11-23 LAB — GLUCOSE, CAPILLARY
GLUCOSE-CAPILLARY: 96 mg/dL (ref 65–99)
GLUCOSE-CAPILLARY: 235 mg/dL — AB (ref 65–99)
GLUCOSE-CAPILLARY: 141 mg/dL — AB (ref 65–99)
Glucose-Capillary: 181 mg/dL — ABNORMAL HIGH (ref 65–99)

## 2016-11-23 NOTE — Progress Notes (Signed)
Subjective/Complaints: Just woke up. Slept well. Denies pain. No new issues per RN.  ROS: limited by aphasia   Objective: Vital Signs: Blood pressure (!) 143/79, pulse 71, temperature 97.8 F (36.6 C), temperature source Oral, resp. rate 18, height 5\' 7"  (1.702 m), weight 74.8 kg (165 lb), SpO2 100 %. No results found. Results for orders placed or performed during the hospital encounter of 10/27/16 (from the past 72 hour(s))  Glucose, capillary     Status: Abnormal   Collection Time: 11/20/16  4:36 PM  Result Value Ref Range   Glucose-Capillary 186 (H) 65 - 99 mg/dL  Glucose, capillary     Status: Abnormal   Collection Time: 11/20/16  9:10 PM  Result Value Ref Range   Glucose-Capillary 168 (H) 65 - 99 mg/dL  Glucose, capillary     Status: None   Collection Time: 11/21/16  6:37 AM  Result Value Ref Range   Glucose-Capillary 93 65 - 99 mg/dL   Comment 1 Notify RN   Glucose, capillary     Status: Abnormal   Collection Time: 11/21/16 12:15 PM  Result Value Ref Range   Glucose-Capillary 301 (H) 65 - 99 mg/dL  Glucose, capillary     Status: Abnormal   Collection Time: 11/21/16  5:12 PM  Result Value Ref Range   Glucose-Capillary 181 (H) 65 - 99 mg/dL  Glucose, capillary     Status: Abnormal   Collection Time: 11/21/16  8:56 PM  Result Value Ref Range   Glucose-Capillary 258 (H) 65 - 99 mg/dL  Glucose, capillary     Status: None   Collection Time: 11/22/16  6:45 AM  Result Value Ref Range   Glucose-Capillary 69 65 - 99 mg/dL   Comment 1 Notify RN   Glucose, capillary     Status: Abnormal   Collection Time: 11/22/16 11:34 AM  Result Value Ref Range   Glucose-Capillary 227 (H) 65 - 99 mg/dL  Glucose, capillary     Status: Abnormal   Collection Time: 11/22/16  4:49 PM  Result Value Ref Range   Glucose-Capillary 189 (H) 65 - 99 mg/dL  Glucose, capillary     Status: Abnormal   Collection Time: 11/22/16  9:12 PM  Result Value Ref Range   Glucose-Capillary 156 (H) 65 - 99  mg/dL   Comment 1 Notify RN   Glucose, capillary     Status: None   Collection Time: 11/23/16  6:52 AM  Result Value Ref Range   Glucose-Capillary 96 65 - 99 mg/dL   Comment 1 Notify RN   Glucose, capillary     Status: Abnormal   Collection Time: 11/23/16 12:10 PM  Result Value Ref Range   Glucose-Capillary 181 (H) 65 - 99 mg/dL     HENT: Poor dentition. Normocephalic. Atraumatic. Eyes: exopthalmos, no ptosis,  EOMI Cardio: RRR without murmur. No JVD    Resp: CTA Bilaterally without wheezes or rales. Normal effort  GI: BS positive and ND Skin:   Intact. Warm and dry. Neuro: Motor tr -1/5 RUE scap protraction and retraction, 3- R hip ext knee ext synergy--no changes -expressive language deficits, very dysarthric. uses communication board Musc/Skel:  No edema. Carin Hock NAD    Assessment/Plan: 1. Functional deficits secondary to Right hemiparesis , dysarthria, dysphagia which require 3+ hours per day of interdisciplinary therapy in a comprehensive inpatient rehab setting. Physiatrist is providing close team supervision and 24 hour management of active medical problems listed below. Physiatrist and rehab team continue to assess barriers to  discharge/monitor patient progress toward functional and medical goals. FIM: Function - Bathing Bathing activity did not occur: Refused Position: Other (comment) (on3:1 in room shower) Body parts bathed by patient: Left arm, Chest, Abdomen, Front perineal area, Buttocks, Right upper leg, Left upper leg, Left lower leg Body parts bathed by helper: Right arm, Right lower leg, Back Assist Level: Touching or steadying assistance(Pt > 75%)  Function- Upper Body Dressing/Undressing What is the patient wearing?: Pull over shirt/dress Pull over shirt/dress - Perfomed by patient: Thread/unthread right sleeve, Put head through opening, Pull shirt over trunk Pull over shirt/dress - Perfomed by helper: Thread/unthread left sleeve Assist Level: More than  reasonable time Function - Lower Body Dressing/Undressing What is the patient wearing?: Underwear, Pants, Socks, Shoes Position: Wheelchair/chair at [employer] - Performed by patient: Thread/unthread right underwear leg Underwear - Performed by helper: Thread/unthread left underwear leg, Pull underwear up/down Pants- Performed by patient: Thread/unthread right pants leg Pants- Performed by helper: Thread/unthread left pants leg, Pull pants up/down Non-skid slipper socks- Performed by patient: Don/doff right sock, Don/doff left sock Non-skid slipper socks- Performed by helper: Don/doff left sock, Don/doff right sock Socks - Performed by helper: Don/doff right sock, Don/doff left sock Shoes - Performed by patient: Don/doff right shoe Shoes - Performed by helper: Don/doff left shoe Assist for footwear: Supervision/touching assist Assist for lower body dressing: Touching or steadying assistance (Pt > 75%)  Function - Toileting Toileting steps completed by patient: Adjust clothing prior to toileting, Adjust clothing after toileting, Performs perineal hygiene Toileting steps completed by helper: Adjust clothing prior to toileting, Performs perineal hygiene, Adjust clothing after toileting Toileting Assistive Devices: Grab bar or rail Assist level: Touching or steadying assistance (Pt.75%)  Function - Air cabin crew transfer assistive device: Radio broadcast assistant lift: Stedy Assist level to toilet: Supervision or verbal cues Assist level from toilet: Moderate assist (Pt 50 - 74%/lift or lower)  Function - Chair/bed transfer Chair/bed transfer method: Stand pivot Chair/bed transfer assist level: Touching or steadying assistance (Pt > 75%) Chair/bed transfer assistive device: Armrests, Bedrails Chair/bed transfer details: Verbal cues for technique, Manual facilitation for weight shifting, Verbal cues for precautions/safety, Manual facilitation for placement, Verbal cues for  sequencing  Function - Locomotion: Wheelchair Will patient use wheelchair at discharge?: Yes Type: Manual Max wheelchair distance: 150 Assist Level: Supervision or verbal cues Assist Level: Supervision or verbal cues Wheel 150 feet activity did not occur: Safety/medical concerns Assist Level: Supervision or verbal cues Turns around,maneuvers to table,bed, and toilet,negotiates 3% grade,maneuvers on rugs and over doorsills: Yes Function - Locomotion: Ambulation Assistive device: Walker-hemi Max distance: 53ft  Assist level: Moderate assist (Pt 50 - 74%) Assist level: Moderate assist (Pt 50 - 74%) Walk 50 feet with 2 turns activity did not occur: Safety/medical concerns Walk 150 feet activity did not occur: Safety/medical concerns Walk 10 feet on uneven surfaces activity did not occur: Safety/medical concerns  Function - Comprehension Comprehension: Auditory Comprehension assist level: Follows basic conversation/direction with extra time/assistive device  Function - Expression Expression: Verbal, Nonverbal Expression assistive device: Communication board Expression assist level: Expresses basic 25 - 49% of the time/requires cueing 50 - 75% of the time. Uses single words/gestures.  Function - Social Interaction Social Interaction assist level: Interacts appropriately with others with medication or extra time (anti-anxiety, antidepressant).  Function - Problem Solving Problem solving assist level: Solves basic 90% of the time/requires cueing < 10% of the time  Function - Memory Memory assist level: Recognizes or recalls 90% of the  time/requires cueing < 10% of the time Patient normally able to recall (first 3 days only): Staff names and faces, That he or she is in a hospital, Location of own room, Current season   Medical Problem List and Plan: 1.  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  DVT Prophylaxis/Anticoagulation: Subcutaneous Lovenox. Venous Doppler studies negative 3. Pain Management: Neurontin 300 mg daily at bedtime 4. Mood: Ego support 5. Neuropsych: This patient is capable of making decisions on his own behalf. 6. Skin/Wound Care:  Routine skin checks 7. Fluids/Electrolytes/Nutrition:  Routine I&Os 8. Dysphagia. D2 thin. Monitor hydration. Follow-up speech therapy  Advance diet as tolerated 9.Hypertension. Lisinopril increased to 20mg  on 10/12,   Amlodipine increased to 5mg  10/17  Improved control overall Vitals:   11/22/16 1457 11/23/16 0308  BP: (!) 101/58 (!) 143/79  Pulse: 74 71  Resp: 16 18  Temp: 98.1 F (36.7 C) 97.8 F (36.6 C)  SpO2: (!) 84% 100%   10. Diabetes mellitus peripheral neuropathy. Hemoglobin A1c 8.9. SSI. Check blood sugars before meals and at bedtime. (PTA Patient on Glucotrol 5 mg twice a day, Glucophage 1000 mg twice a day).  Glucotrol 5mg  qam, pm CBG remain elevated,Changed to  ER formulation CBG (last 3)   Recent Labs  11/22/16 2112 11/23/16 0652 11/23/16 1210  GLUCAP 156* 96 181*   Much improved. No changes at this point  -dietary education ongoing 11.Polysubstance abuse. UDS positive for cocaine. Provide counseling with Neuropsych 12.History polycythemia.   Labs stable 13. ?PBA: Monitor.  Will consider Neuropsych, improved on sertraline 14.  Buccal surface laceration likely related to dyscoordinated oral movements, left side over salivary duct: MMW 15.  Hypoalbuminemia-  prostat    LOS (Days) 27 A FACE TO FACE EVALUATION WAS PERFORMED  Shaunak Kreis T 11/23/2016, 12:29 PM

## 2016-11-24 ENCOUNTER — Inpatient Hospital Stay (HOSPITAL_COMMUNITY): Payer: No Typology Code available for payment source | Admitting: Physical Therapy

## 2016-11-24 ENCOUNTER — Inpatient Hospital Stay (HOSPITAL_COMMUNITY): Payer: Medicaid Other | Admitting: Speech Pathology

## 2016-11-24 ENCOUNTER — Inpatient Hospital Stay (HOSPITAL_COMMUNITY): Payer: No Typology Code available for payment source | Admitting: Occupational Therapy

## 2016-11-24 LAB — CREATININE, SERUM
CREATININE: 0.98 mg/dL (ref 0.61–1.24)
GFR calc non Af Amer: >60 mL/min (ref >60)

## 2016-11-24 LAB — GLUCOSE, CAPILLARY
GLUCOSE-CAPILLARY: 252 mg/dL — AB (ref 65–99)
Glucose-Capillary: 95 mg/dL (ref 65–99)
Glucose-Capillary: 154 mg/dL — ABNORMAL HIGH (ref 65–99)
Glucose-Capillary: 240 mg/dL — ABNORMAL HIGH (ref 65–99)

## 2016-11-24 NOTE — Progress Notes (Signed)
Occupational Therapy Session Note  Patient Details  Name: Eric Morton MRN: [id number] Date of Birth: [birth year]  Today's Date: 11/24/2016 OT Individual Time: 0930-1030 OT Individual Time Calculation (min): 60 min    Skilled Therapeutic Interventions/Progress Updates: patient participated in bathing and dresing with OT this am with focus on bathing and dressing lower body, balance and addressing right hemiparesis.   He rquired overall mod assist due to right hemiparesis and decreased dynamic sitting and standing balance.  Patient was left seated In his w/c with call betll within reach.    He was given a long handled sponge to assist with washing bilateral lower legs and feet today.     Therapy Documentation Precautions:  Precautions Precautions: Fall Restrictions Weight Bearing Restrictions: No   Therapy/Group: Individual Therapy  Herschell Dimes 11/24/2016, 12:39 PM

## 2016-11-24 NOTE — Progress Notes (Signed)
Subjective/Complaints: [place] self to PT, indicates that RUE moving better  ROS: limited by aphasia   Objective: Vital Signs: Blood pressure (!) 142/70, pulse 67, temperature 97.6 F (36.4 C), temperature source Oral, resp. rate 18, height '5\' 7"'  (1.702 m), weight 74.8 kg (165 lb), SpO2 99 %. No results found. Results for orders placed or performed during the hospital encounter of 10/27/16 (from the past 72 hour(s))  Glucose, capillary     Status: Abnormal   Collection Time: 11/21/16 12:15 PM  Result Value Ref Range   Glucose-Capillary 301 (H) 65 - 99 mg/dL  Glucose, capillary     Status: Abnormal   Collection Time: 11/21/16  5:12 PM  Result Value Ref Range   Glucose-Capillary 181 (H) 65 - 99 mg/dL  Glucose, capillary     Status: Abnormal   Collection Time: 11/21/16  8:56 PM  Result Value Ref Range   Glucose-Capillary 258 (H) 65 - 99 mg/dL  Glucose, capillary     Status: None   Collection Time: 11/22/16  6:45 AM  Result Value Ref Range   Glucose-Capillary 69 65 - 99 mg/dL   Comment 1 Notify RN   Glucose, capillary     Status: Abnormal   Collection Time: 11/22/16 11:34 AM  Result Value Ref Range   Glucose-Capillary 227 (H) 65 - 99 mg/dL  Glucose, capillary     Status: Abnormal   Collection Time: 11/22/16  4:49 PM  Result Value Ref Range   Glucose-Capillary 189 (H) 65 - 99 mg/dL  Glucose, capillary     Status: Abnormal   Collection Time: 11/22/16  9:12 PM  Result Value Ref Range   Glucose-Capillary 156 (H) 65 - 99 mg/dL   Comment 1 Notify RN   Glucose, capillary     Status: None   Collection Time: 11/23/16  6:52 AM  Result Value Ref Range   Glucose-Capillary 96 65 - 99 mg/dL   Comment 1 Notify RN   Glucose, capillary     Status: Abnormal   Collection Time: 11/23/16 12:10 PM  Result Value Ref Range   Glucose-Capillary 181 (H) 65 - 99 mg/dL  Glucose, capillary     Status: Abnormal   Collection Time: 11/23/16  4:40 PM  Result Value Ref Range   Glucose-Capillary 235  (H) 65 - 99 mg/dL  Glucose, capillary     Status: Abnormal   Collection Time: 11/23/16  9:44 PM  Result Value Ref Range   Glucose-Capillary 141 (H) 65 - 99 mg/dL  Creatinine, serum     Status: None   Collection Time: 11/24/16  5:12 AM  Result Value Ref Range   Creatinine, Ser 0.98 0.61 - 1.24 mg/dL   GFR calc non Af Amer >60 >60 mL/min   GFR calc Af Amer >60 >60 mL/min    Comment: (NOTE) The eGFR has been calculated using the CKD EPI equation. This calculation has not been validated in all clinical situations. eGFR's persistently <60 mL/min signify possible Chronic Kidney Disease.   Glucose, capillary     Status: None   Collection Time: 11/24/16  6:52 AM  Result Value Ref Range   Glucose-Capillary 95 65 - 99 mg/dL   Comment 1 Notify RN      HENT: Poor dentition. Normocephalic. Atraumatic. Eyes: exopthalmos, no ptosis,  EOMI Cardio: RRR without murmur. No JVD    Resp: CTA Bilaterally without wheezes or rales. Normal effort  GI: BS positive and ND Skin:   Intact. Warm and dry. Neuro: Motor tr -1/5  RUE scap protraction and retraction,trace biceps  3- R hip ext knee ext synergy--no changes -expressive language deficits, very dysarthric. uses communication board Musc/Skel:  No edema. Carin Hock NAD    Assessment/Plan: 1. Functional deficits secondary to Right hemiparesis , dysarthria, dysphagia which require 3+ hours per day of interdisciplinary therapy in a comprehensive inpatient rehab setting. Physiatrist is providing close team supervision and 24 hour management of active medical problems listed below. Physiatrist and rehab team continue to assess barriers to discharge/monitor patient progress toward functional and medical goals. FIM: Function - Bathing Bathing activity did not occur: Refused Position: Other (comment) (on3:1 in room shower) Body parts bathed by patient: Left arm, Chest, Abdomen, Front perineal area, Buttocks, Right upper leg, Left upper leg, Left lower leg Body  parts bathed by helper: Right arm, Right lower leg, Back Assist Level: Touching or steadying assistance(Pt > 75%)  Function- Upper Body Dressing/Undressing What is the patient wearing?: Pull over shirt/dress Pull over shirt/dress - Perfomed by patient: Thread/unthread right sleeve, Put head through opening, Pull shirt over trunk Pull over shirt/dress - Perfomed by helper: Thread/unthread left sleeve Assist Level: More than reasonable time Function - Lower Body Dressing/Undressing What is the patient wearing?: Underwear, Pants, Socks, Shoes Position: Wheelchair/chair at [employer] - Performed by patient: Thread/unthread right underwear leg Underwear - Performed by helper: Thread/unthread left underwear leg, Pull underwear up/down Pants- Performed by patient: Thread/unthread right pants leg Pants- Performed by helper: Thread/unthread left pants leg, Pull pants up/down Non-skid slipper socks- Performed by patient: Don/doff right sock, Don/doff left sock Non-skid slipper socks- Performed by helper: Don/doff left sock, Don/doff right sock Socks - Performed by helper: Don/doff right sock, Don/doff left sock Shoes - Performed by patient: Don/doff right shoe Shoes - Performed by helper: Don/doff left shoe Assist for footwear: Supervision/touching assist Assist for lower body dressing: Touching or steadying assistance (Pt > 75%)  Function - Toileting Toileting steps completed by patient: Adjust clothing prior to toileting, Adjust clothing after toileting, Performs perineal hygiene Toileting steps completed by helper: Adjust clothing prior to toileting, Performs perineal hygiene, Adjust clothing after toileting Toileting Assistive Devices: Grab bar or rail Assist level: Touching or steadying assistance (Pt.75%)  Function - Air cabin crew transfer assistive device: Radio broadcast assistant lift: Stedy Assist level to toilet: Supervision or verbal cues Assist level from toilet: Moderate  assist (Pt 50 - 74%/lift or lower)  Function - Chair/bed transfer Chair/bed transfer method: Stand pivot Chair/bed transfer assist level: Touching or steadying assistance (Pt > 75%) Chair/bed transfer assistive device: Armrests, Bedrails Chair/bed transfer details: Verbal cues for technique, Manual facilitation for weight shifting, Verbal cues for precautions/safety, Manual facilitation for placement, Verbal cues for sequencing  Function - Locomotion: Wheelchair Will patient use wheelchair at discharge?: Yes Type: Manual Max wheelchair distance: 150 Assist Level: Supervision or verbal cues Assist Level: Supervision or verbal cues Wheel 150 feet activity did not occur: Safety/medical concerns Assist Level: Supervision or verbal cues Turns around,maneuvers to table,bed, and toilet,negotiates 3% grade,maneuvers on rugs and over doorsills: Yes Function - Locomotion: Ambulation Assistive device: Walker-hemi Max distance: 69f  Assist level: Moderate assist (Pt 50 - 74%) Assist level: Moderate assist (Pt 50 - 74%) Walk 50 feet with 2 turns activity did not occur: Safety/medical concerns Walk 150 feet activity did not occur: Safety/medical concerns Walk 10 feet on uneven surfaces activity did not occur: Safety/medical concerns  Function - Comprehension Comprehension: Auditory Comprehension assist level: Follows basic conversation/direction with extra time/assistive device  Function -  Expression Expression: Verbal, Nonverbal Expression assistive device: Communication board Expression assist level: Expresses basic 25 - 49% of the time/requires cueing 50 - 75% of the time. Uses single words/gestures.  Function - Social Interaction Social Interaction assist level: Interacts appropriately with others with medication or extra time (anti-anxiety, antidepressant).  Function - Problem Solving Problem solving assist level: Solves basic 90% of the time/requires cueing < 10% of the  time  Function - Memory Memory assist level: Recognizes or recalls 90% of the time/requires cueing < 10% of the time Patient normally able to recall (first 3 days only): Staff names and faces, That he or she is in a hospital, Location of own room, Current season   Medical Problem List and Plan: 1.  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  DVT Prophylaxis/Anticoagulation: Subcutaneous Lovenox. Venous Doppler studies negative 3. Pain Management: Neurontin 300 mg daily at bedtime 4. Mood: Ego support 5. Neuropsych: This patient is capable of making decisions on his own behalf. 6. Skin/Wound Care:  Routine skin checks 7. Fluids/Electrolytes/Nutrition:  Routine I&Os 8. Dysphagia. D2 thin. Monitor hydration. Follow-up speech therapy  Advance diet as tolerated 9.Hypertension. Lisinopril increased to 68m on 10/12,   Amlodipine increased to 528m10/17  Controlled 10/29 Vitals:   11/23/16 2045 11/24/16 0425  BP: 126/70 (!) 142/70  Pulse: 77 67  Resp:  18  Temp:  97.6 F (36.4 C)  SpO2: 100% 99%   10. Diabetes mellitus peripheral neuropathy. Hemoglobin A1c 8.9. SSI. Check blood sugars before meals and at bedtime. (PTA Patient on Glucotrol 5 mg twice a day, Glucophage 1000 mg twice a day).  Glucotrol 79m47mam, pm CBG remain elevated,Changed to  ER formulation will increase to 81m39mG (last 3)   Recent Labs  11/23/16 1640 11/23/16 2144 11/24/16 0652  GLUCAP 235* 141* 95   Elevated PM CBG 10/28.   -dietary education ongoing 11.Polysubstance abuse. UDS positive for cocaine. Provide counseling with Neuropsych 12.History polycythemia.   Labs stable 13. ?PBA: Monitor.   improved on sertraline 14.  Buccal surface laceration likely related to dyscoordinated oral movements, left side over salivary duct: MMW 15.   Hypoalbuminemia-  prostat    LOS (Days) 28 A FACE TO FACE EVALUATION WAS PERFORMED  Eh Sesay E 11/24/2016, 8:32 AM

## 2016-11-24 NOTE — Progress Notes (Signed)
Speech Language Pathology Daily Session Note  Patient Details  Name: Eric Morton MRN: [id number] Date of Birth: [birth year]  Today's Date: 11/24/2016 SLP Individual Time: 1130-1200 SLP Individual Time Calculation (min): 30 min  Short Term Goals: Week 4: SLP Short Term Goal 1 (Week 4): Pt will utilize speech intelligibility strategies at word level with Mod A verbal cues to acheive 50% intelligibility.  SLP Short Term Goal 2 (Week 4): Pt will demonstrate functional problem solving for mildly complex tasks with Supervision verbal cues. SLP Short Term Goal 3 (Week 4): Pt will consume dysphagia 2 with thin liquids and minimal overt s/s of aspiration with supervision cues to use compensatory swallow strategies.  SLP Short Term Goal 4 (Week 4): Pt will perform 3 sets of 5 reps of pharyngeal strengthening exercises with Max A cues.   Skilled Therapeutic Interventions: Skilled treatment session focused on dysphagia and cognition goals. SLP facilitated session by providing skilled observation of pt consuming dysphagia 3 lunch tray with thin liquids. Pt's oral phase continues to be disorganized but was functional with dysphagia 3 diet. Therefore recommend upgrade to dysphagia 3. Pt required Mod A cues to no talk during consumption. Education given that hotdogs were not part of dysphagia 3 diet. Pt returned to room, left upright with all needs within reach. Continue per current plan of care.      Function:  Eating Eating   Modified Consistency Diet: Yes Eating Assist Level: Set up assist for;Supervision or verbal cues   Eating Set Up Assist For: Opening containers;Cutting food       Cognition Comprehension Comprehension assist level: Follows basic conversation/direction with extra time/assistive device  Expression Expression assistive device: Communication board Expression assist level: Expresses basic 50 - 74% of the time/requires cueing 25 - 49% of the time. Needs to repeat parts of  sentences.  Social Interaction Social Interaction assist level: Interacts appropriately with others with medication or extra time (anti-anxiety, antidepressant).  Problem Solving Problem solving assist level: Solves basic 90% of the time/requires cueing < 10% of the time  Memory Memory assist level: Recognizes or recalls 90% of the time/requires cueing < 10% of the time    Pain Pain Assessment Pain Assessment: No/denies pain  Therapy/Group: Individual Therapy  Shakila Mak 11/24/2016, 12:22 PM

## 2016-11-24 NOTE — Progress Notes (Signed)
Physical Therapy Session Note  Patient Details  Name: Eric Morton MRN: [id number] Date of Birth: Sep 04, 1965  Today's Date: 11/24/2016 PT Individual Time: 0805-0900 and 1450-1540  PT Individual Time Calculation (min): 55 min and 50 min  Short Term Goals: Week 3:  PT Short Term Goal 1 (Week 3): STG = LTG due to ELOS  Skilled Therapeutic Interventions/Progress Updates: Tx1: Pt presented in bed agreeable to therapy. PT donned pants in supine with minA. Pt performed supine to sit from flat surface with minA. Performed squat pivot to w/c minA. Pt propelled to rehab supervision with brief stop by MD for daily assessment. Pt participated in sit to/from stand at mat with emphasis on R wt bearing and controlled descent. Pt performed standing balance with HW and AAROM of RUE. Pt required mod cues for glute activation when performing duel task activity. Performed stand pivot with HW to w/c with modA for [facility] safety/placement. Pt transported back to room and remained in w/c with call bell within reach and needs met.   Tx2: Pt presented in bed agreeable to therapy. Pt requesting to void provided minA for urinal placement (+void). Pt performed bed mobility from flat bed with use of bed rails with min guard. Performed squat pivot to w/c minA. Pt transported to rehab gym for time management and transferred to mat squat pivot. Pt participated in R NMR via forced use performing SAQ, HS pulls with yellow physioball x 15 ea, bridges x 10 and bridges from green physioball x 5 for increased R glute recruitment. Pt able to express increased sensation in R hip flexor when performing activity. Pt returned to sitting with minA for truncal support and performed gait training with HW x 63f with noted improved posture, awareness of RLE foot placement, and decreased occurences of R knee buckling. Pt returned to room and remained in w/c at end of session with needs met.      Therapy Documentation Precautions:   Precautions Precautions: Fall Restrictions Weight Bearing Restrictions: No General:   Vital Signs: Therapy Vitals Temp: 97.8 F (36.6 C) Temp Source: Oral Pulse Rate: 80 Resp: 18 BP: (!) 143/89 Patient Position (if appropriate): Lying Oxygen Therapy SpO2: 98 % O2 Device: Not Delivered   See Function Navigator for Current Functional Status.   Therapy/Group: Individual Therapy  Shweta Aman  Naoma Boxell, PTA  11/24/2016, 4:12 PM

## 2016-11-25 ENCOUNTER — Inpatient Hospital Stay (HOSPITAL_COMMUNITY): Payer: No Typology Code available for payment source | Admitting: Physical Therapy

## 2016-11-25 ENCOUNTER — Ambulatory Visit (HOSPITAL_COMMUNITY): Payer: No Typology Code available for payment source

## 2016-11-25 ENCOUNTER — Inpatient Hospital Stay (HOSPITAL_COMMUNITY): Payer: No Typology Code available for payment source | Admitting: Occupational Therapy

## 2016-11-25 LAB — GLUCOSE, CAPILLARY
GLUCOSE-CAPILLARY: 88 mg/dL (ref 65–99)
GLUCOSE-CAPILLARY: 308 mg/dL — AB (ref 65–99)
Glucose-Capillary: 341 mg/dL — ABNORMAL HIGH (ref 65–99)
Glucose-Capillary: 190 mg/dL — ABNORMAL HIGH (ref 65–99)

## 2016-11-25 MED ORDER — GLIPIZIDE ER 10 MG PO TB24
10.0000 mg | ORAL_TABLET | Freq: Every day | ORAL | Status: DC
  Administered 2016-11-26: 10 mg via ORAL
  Filled 2016-11-25 (×2): qty 1

## 2016-11-25 NOTE — Progress Notes (Signed)
Physical Therapy Session Note  Patient Details  Name: Eric Morton MRN: [id number] Date of Birth: [birth year]  Today's Date: 11/25/2016 PT Individual Time: 3870-6582 PT Individual Time Calculation (min): 72 min   Short Term Goals: Week 4:  PT Short Term Goal 1 (Week 4): STG =LTG due to estimated discharge date.   Skilled Therapeutic Interventions/Progress Updates: Pt presented in w/c agreeable to therapy. Pt propelled to rehab gym supervision. Performed squat pivot to mat min guard. Pt performed sit to/from stands with min guard, manual cues for increased wt shift to R. Pt able to maintain fair balance with improving wt shift to R. Pt noted to maintain R quad control for increased time bouts. Gait training with HW x 60f, with improved sequencing and decreased cues for posture. Pt transported outside for w/c mobility on uneven surfaces and small grades. Pt required cues for safety when on downgrades. Pt required increased time on uneven surfaces and when propelling over thresholds but was able to safety maneuver in/out of elevator. Pt returned to unit and transported back to room where he remained in his w/c with call bell within reach and needs met.      Therapy Documentation Precautions:  Precautions Precautions: Fall Restrictions Weight Bearing Restrictions: No General:   Vital Signs: Therapy Vitals Temp: (!) 97.5 F (36.4 C) Temp Source: Oral Pulse Rate: 73 Resp: 18 BP: 111/66 Patient Position (if appropriate): Sitting Oxygen Therapy SpO2: 100 % O2 Device: Not Delivered Pain: Pain Assessment Pain Assessment: No/denies pain   See Function Navigator for Current Functional Status.   Therapy/Group: Individual Therapy  Yarieliz Wasser  Levis Nazir, PTA  11/25/2016, 4:24 PM

## 2016-11-25 NOTE — Progress Notes (Signed)
Social Work Patient ID: Eric Morton, male   DOB: 11-01-1965, 51 y.o.   MRN: [id number]  Spoke with sister who reports pt can come to stay with her but she does live on the second floor apartment. Made aware he will not agree to leave the area. She will talk with pt tonight and decide on a plan. Pt may want to go back top the shed he was living in, so he can smoke and drink. Made him aware he will be more at risk for another stroke and no one will be there with him. His sister will be there in the evenigs at least. Made aware to both of them discharge this week.

## 2016-11-25 NOTE — Progress Notes (Signed)
Social Work Patient ID: Eric Morton, male   DOB: Feb 01, 1965, 50 y.o.   MRN: [id number]  Have attempted to contact pt's sister yesterday and today without success. Pt is aware and if talks to her will let her know. Need to discuss the plans with her. Pt is aware and wants to talk with her also. He reports his aunt is in [place] to try to reach sister.

## 2016-11-25 NOTE — Progress Notes (Signed)
Occupational Therapy Session Note  Patient Details  Name: Eric Morton MRN: [id number] Date of Birth: 1965-01-29  Today's Date: 11/25/2016 OT Individual Time: 7622-6333 OT Individual Time Calculation (min): 57 min    Short Term Goals: Week 4:  OT Short Term Goal 1 (Week 4): STG=LTG as awaiting SNF placement  Skilled Therapeutic Interventions/Progress Updates:    Pt seated in w/c upon OT arrival and was agreeable to participating in OT session.  Pt declined participating in ADLs at this time, stating he had showered yesterday.  Pt donned R shoe with A to attain figure 4 sit, adjust the heel of the shoe, and tie laces.  Pt propelled self down to gym from w/c level with min VCing for environmental awareness.  Pt completed stand pivot transfer towards R with min A and participated in R NMR activity.  With Max A to facilitate RUE positioning, pt completed weightbearing while reaching contralaterally with LUE to retrieve puzzle strips to form picture.  Pt able to self-correct occasional anterior LOB.  Mod A to place strips correctly with cueing to attend to details of picture.  Pt then participated in squat pivot transfer training towards bilateral sides, completing L squat pivot transfer with close SBA and transfer towards R with CGA at R knee.  Assisted pt back to room and left pt seated in w/c with call light and all other needs in reach.  Therapy Documentation Precautions:  Precautions Precautions: Fall Restrictions Weight Bearing Restrictions: No Pain: Pain Assessment Pain Assessment: No/denies pain  See Function Navigator for Current Functional Status.   Therapy/Group: Individual Therapy  Marcella Dubs 11/25/2016, 12:14 PM

## 2016-11-25 NOTE — Progress Notes (Signed)
Occupational Therapy Session Note  Patient Details  Name: DYSHON PHILBIN MRN: [id number] Date of Birth: [birth year]  Today's Date: 11/25/2016 OT Individual Time: 0800-0830 OT Individual Time Calculation (min): 30 min    Short Term Goals: Week 4:  OT Short Term Goal 1 (Week 4): STG=LTG as awaiting SNF placement  Skilled Therapeutic Interventions/Progress Updates:    Pt seen for OT session focusing on functional mobility, communication, and d/c planning. Pt sitting up in w/c upon arrival with NT present assisting pt with set-up of breakfast tray. Pt agreeable to eating breakfast in day room with therapist. He self propelled w/c throuhgout unit using hemi techniques with min VCs for awarenss to environmental obstacles on R and effective propulsion techniques.  During breakfast, pt attempting to verbalize needs and desires. Intelligibility ~30%$, with encouragement to use communication board, he was able to fill in caps using board. Discussed d/c planning, pt with questions regarding why he can't stay on IPR until he is fully recovered, continuum of care, etc. Pt returned to room at end of session, left seated in w/c with all needs in reach.   Therapy Documentation Precautions:  Precautions Precautions: Fall Restrictions Weight Bearing Restrictions: No Pain: Pain Assessment Pain Assessment: No/denies pain Pain Score: 0-No pain  See Function Navigator for Current Functional Status.   Therapy/Group: Individual Therapy  Lewis, Jasie Meleski C 11/25/2016, 7:09 AM

## 2016-11-25 NOTE — Progress Notes (Signed)
Speech Language Pathology Daily Session Note  Patient Details  Name: Eric Morton MRN: [id number] Date of Birth: 12/19/1965  Today's Date: 11/25/2016 SLP Individual Time: 1133-1210 SLP Individual Time Calculation (min): 37 min  Short Term Goals: Week 4: SLP Short Term Goal 1 (Week 4): Pt will utilize speech intelligibility strategies at word level with Mod A verbal cues to acheive 50% intelligibility.  SLP Short Term Goal 2 (Week 4): Pt will demonstrate functional problem solving for mildly complex tasks with Supervision verbal cues. SLP Short Term Goal 3 (Week 4): Pt will consume dysphagia 2 with thin liquids and minimal overt s/s of aspiration with supervision cues to use compensatory swallow strategies.  SLP Short Term Goal 4 (Week 4): Pt will perform 3 sets of 5 reps of pharyngeal strengthening exercises with Max A cues.   Skilled Therapeutic Interventions: Skilled ST services focused on swallow and communication skills. SLP facilitated PO consumption of recently upgraded diet of Dys 3 and thin liquids, pt required assistance cutting meat and Min A verbal cues to utilize swallow strategies and no overt s/s aspiration. Pt attempted to communicate verbally with Max impairment, however demonstrated ability to problem solving utilizing communication board, yes/no question and gestures at Mod I level. Pt was left in room with nursing staff. Continue ST services.      Function:  Eating Eating   Modified Consistency Diet: Yes Eating Assist Level: Set up assist for;Supervision or verbal cues   Eating Set Up Assist For: Opening containers;Cutting food       Cognition Comprehension Comprehension assist level: Follows basic conversation/direction with extra time/assistive device  Expression Expression assistive device: Communication board Expression assist level: Expresses basic 50 - 74% of the time/requires cueing 25 - 49% of the time. Needs to repeat parts of sentences.  Social  Interaction Social Interaction assist level: Interacts appropriately with others with medication or extra time (anti-anxiety, antidepressant).  Problem Solving Problem solving assist level: Solves basic 90% of the time/requires cueing < 10% of the time  Memory Memory assist level: Recognizes or recalls 90% of the time/requires cueing < 10% of the time    Pain Pain Assessment Pain Assessment: No/denies pain  Therapy/Group: Individual Therapy  Nile Dorning 11/25/2016, 4:10 PM

## 2016-11-25 NOTE — Progress Notes (Signed)
Subjective/Complaints: [place] self to OT, indicates that RUE moving better  ROS: limited by aphasia   Objective: Vital Signs: Blood pressure 137/88, pulse 68, temperature (!) 97.3 F (36.3 C), temperature source Oral, resp. rate 18, height '5\' 7"'  (1.702 m), weight 74.8 kg (165 lb), SpO2 100 %. No results found. Results for orders placed or performed during the hospital encounter of 10/27/16 (from the past 72 hour(s))  Glucose, capillary     Status: Abnormal   Collection Time: 11/22/16 11:34 AM  Result Value Ref Range   Glucose-Capillary 227 (H) 65 - 99 mg/dL  Glucose, capillary     Status: Abnormal   Collection Time: 11/22/16  4:49 PM  Result Value Ref Range   Glucose-Capillary 189 (H) 65 - 99 mg/dL  Glucose, capillary     Status: Abnormal   Collection Time: 11/22/16  9:12 PM  Result Value Ref Range   Glucose-Capillary 156 (H) 65 - 99 mg/dL   Comment 1 Notify RN   Glucose, capillary     Status: None   Collection Time: 11/23/16  6:52 AM  Result Value Ref Range   Glucose-Capillary 96 65 - 99 mg/dL   Comment 1 Notify RN   Glucose, capillary     Status: Abnormal   Collection Time: 11/23/16 12:10 PM  Result Value Ref Range   Glucose-Capillary 181 (H) 65 - 99 mg/dL  Glucose, capillary     Status: Abnormal   Collection Time: 11/23/16  4:40 PM  Result Value Ref Range   Glucose-Capillary 235 (H) 65 - 99 mg/dL  Glucose, capillary     Status: Abnormal   Collection Time: 11/23/16  9:44 PM  Result Value Ref Range   Glucose-Capillary 141 (H) 65 - 99 mg/dL  Creatinine, serum     Status: None   Collection Time: 11/24/16  5:12 AM  Result Value Ref Range   Creatinine, Ser 0.98 0.61 - 1.24 mg/dL   GFR calc non Af Amer >60 >60 mL/min   GFR calc Af Amer >60 >60 mL/min    Comment: (NOTE) The eGFR has been calculated using the CKD EPI equation. This calculation has not been validated in all clinical situations. eGFR's persistently <60 mL/min signify possible Chronic Kidney Disease.    Glucose, capillary     Status: None   Collection Time: 11/24/16  6:52 AM  Result Value Ref Range   Glucose-Capillary 95 65 - 99 mg/dL   Comment 1 Notify RN   Glucose, capillary     Status: Abnormal   Collection Time: 11/24/16 11:55 AM  Result Value Ref Range   Glucose-Capillary 252 (H) 65 - 99 mg/dL  Glucose, capillary     Status: Abnormal   Collection Time: 11/24/16  5:00 PM  Result Value Ref Range   Glucose-Capillary 154 (H) 65 - 99 mg/dL  Glucose, capillary     Status: Abnormal   Collection Time: 11/24/16  9:41 PM  Result Value Ref Range   Glucose-Capillary 240 (H) 65 - 99 mg/dL  Glucose, capillary     Status: None   Collection Time: 11/25/16  6:44 AM  Result Value Ref Range   Glucose-Capillary 88 65 - 99 mg/dL     HENT: Poor dentition. Normocephalic. Atraumatic. Eyes: exopthalmos, no ptosis,  EOMI Cardio: RRR without murmur. No JVD    Resp: CTA Bilaterally without wheezes or rales. Normal effort  GI: BS positive and ND Skin:   Intact. Warm and dry. Neuro: Motor tr -1/5 RUE scap protraction and retraction,trace biceps  3- R hip ext knee ext synergy--no changes -expressive language deficits, very dysarthric. uses communication board Musc/Skel:  No edema. Carin Hock NAD    Assessment/Plan: 1. Functional deficits secondary to Right hemiparesis , dysarthria, dysphagia which require 3+ hours per day of interdisciplinary therapy in a comprehensive inpatient rehab setting. Physiatrist is providing close team supervision and 24 hour management of active medical problems listed below. Physiatrist and rehab team continue to assess barriers to discharge/monitor patient progress toward functional and medical goals. FIM: Function - Bathing Bathing activity did not occur: Refused Position: Shower Body parts bathed by patient: Right arm, Chest, Abdomen, Front perineal area, Buttocks, Right upper leg, Left upper leg Body parts bathed by helper: Left arm, Right lower leg, Left lower leg,  Back Assist Level: Touching or steadying assistance(Pt > 75%)  Function- Upper Body Dressing/Undressing What is the patient wearing?: Pull over shirt/dress Pull over shirt/dress - Perfomed by patient: Thread/unthread right sleeve, Thread/unthread left sleeve, Put head through opening Pull over shirt/dress - Perfomed by helper: Pull shirt over trunk Assist Level: More than reasonable time Function - Lower Body Dressing/Undressing What is the patient wearing?: Socks, Shoes, Pants, Underwear Position: Wheelchair/chair at [employer] - Performed by patient: Thread/unthread left underwear leg Underwear - Performed by helper: Thread/unthread right underwear leg, Pull underwear up/down Pants- Performed by patient: Thread/unthread left pants leg Pants- Performed by helper: Thread/unthread right pants leg, Pull pants up/down Non-skid slipper socks- Performed by patient: Don/doff right sock, Don/doff left sock Non-skid slipper socks- Performed by helper: Don/doff left sock, Don/doff right sock Socks - Performed by patient: Don/doff left sock Socks - Performed by helper: Don/doff right sock Shoes - Performed by patient: Don/doff left shoe Shoes - Performed by helper: Don/doff right shoe, Fasten right, Fasten left Assist for footwear: Supervision/touching assist Assist for lower body dressing: Touching or steadying assistance (Pt > 75%)  Function - Toileting Toileting steps completed by patient: Adjust clothing prior to toileting, Adjust clothing after toileting, Performs perineal hygiene Toileting steps completed by helper: Adjust clothing prior to toileting, Performs perineal hygiene, Adjust clothing after toileting Toileting Assistive Devices: Grab bar or rail Assist level: Touching or steadying assistance (Pt.75%)  Function - Air cabin crew transfer assistive device: Radio broadcast assistant lift: Stedy Assist level to toilet: Supervision or verbal cues Assist level from toilet:  Moderate assist (Pt 50 - 74%/lift or lower)  Function - Chair/bed transfer Chair/bed transfer method: Stand pivot Chair/bed transfer assist level: Touching or steadying assistance (Pt > 75%) Chair/bed transfer assistive device: Armrests, Bedrails Chair/bed transfer details: Verbal cues for technique, Manual facilitation for weight shifting, Verbal cues for precautions/safety, Manual facilitation for placement, Verbal cues for sequencing  Function - Locomotion: Wheelchair Will patient use wheelchair at discharge?: Yes Type: Manual Max wheelchair distance: 150 Assist Level: Supervision or verbal cues Assist Level: Supervision or verbal cues Wheel 150 feet activity did not occur: Safety/medical concerns Assist Level: Supervision or verbal cues Turns around,maneuvers to table,bed, and toilet,negotiates 3% grade,maneuvers on rugs and over doorsills: Yes Function - Locomotion: Ambulation Assistive device: Walker-hemi Max distance: 60f  Assist level: Moderate assist (Pt 50 - 74%) Assist level: Moderate assist (Pt 50 - 74%) Walk 50 feet with 2 turns activity did not occur: Safety/medical concerns Walk 150 feet activity did not occur: Safety/medical concerns Walk 10 feet on uneven surfaces activity did not occur: Safety/medical concerns  Function - Comprehension Comprehension: Auditory Comprehension assist level: Follows basic conversation/direction with extra time/assistive device  Function - Expression Expression: Verbal,  Nonverbal Expression assistive device: Communication board Expression assist level: Expresses basic 50 - 74% of the time/requires cueing 25 - 49% of the time. Needs to repeat parts of sentences.  Function - Social Interaction Social Interaction assist level: Interacts appropriately with others with medication or extra time (anti-anxiety, antidepressant).  Function - Problem Solving Problem solving assist level: Solves basic 90% of the time/requires cueing < 10% of  the time  Function - Memory Memory assist level: Recognizes or recalls 90% of the time/requires cueing < 10% of the time Patient normally able to recall (first 3 days only): Staff names and faces, That he or she is in a hospital, Location of own room, Current season   Medical Problem List and Plan: 1.  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  DVT Prophylaxis/Anticoagulation: Subcutaneous Lovenox. Venous Doppler studies negative 3. Pain Management: Neurontin 300 mg daily at bedtime 4. Mood: Ego support 5. Neuropsych: This patient is capable of making decisions on his own behalf. 6. Skin/Wound Care:  Routine skin checks 7. Fluids/Electrolytes/Nutrition:  Routine I&Os 8. Dysphagia. D2 thin. Monitor hydration. Follow-up speech therapy  Advance diet as tolerated 9.Hypertension. Lisinopril increased to 66m on 10/12,   Amlodipine increased to 532m10/17  Controlled 10/30 Vitals:   11/24/16 2100 11/25/16 0450  BP: 134/71 137/88  Pulse: 70 68  Resp:    Temp:  (!) 97.3 F (36.3 C)  SpO2: 100% 100%   10. Diabetes mellitus peripheral neuropathy. Hemoglobin A1c 8.9. SSI. Check blood sugars before meals and at bedtime. (PTA Patient on Glucotrol 5 mg twice a day, Glucophage 1000 mg twice a day).  Glucotrol 82m20mam, pm CBG remain elevated,Changed to  ER formulation will increase to 19m60marting 10/31  CBG (last 3)   Recent Labs  11/24/16 1700 11/24/16 2141 11/25/16 0644  GLUCAP 154* 240* 88   Elevated PM CBG 10/29.   -dietary education ongoing 11.Polysubstance abuse. UDS positive for cocaine. Provide counseling with Neuropsych 12.History polycythemia.   Labs stable 13. ?PBA: Monitor.   improved on sertraline 14.  Buccal surface laceration likely related to dyscoordinated oral movements, left side  over salivary duct: MMW 15.  Hypoalbuminemia-  prostat    LOS (Days) 29 A FACE TO FACE EVALUATION WAS PERFORMED  Arrielle Mcginn E 11/25/2016, 8:14 AM

## 2016-11-26 ENCOUNTER — Inpatient Hospital Stay (HOSPITAL_COMMUNITY): Payer: No Typology Code available for payment source | Admitting: Physical Therapy

## 2016-11-26 ENCOUNTER — Inpatient Hospital Stay (HOSPITAL_COMMUNITY): Payer: Medicaid Other | Admitting: Speech Pathology

## 2016-11-26 ENCOUNTER — Inpatient Hospital Stay (HOSPITAL_COMMUNITY): Payer: No Typology Code available for payment source | Admitting: Occupational Therapy

## 2016-11-26 LAB — GLUCOSE, CAPILLARY
Glucose-Capillary: 93 mg/dL (ref 65–99)
Glucose-Capillary: 230 mg/dL — ABNORMAL HIGH (ref 65–99)
Glucose-Capillary: 216 mg/dL — ABNORMAL HIGH (ref 65–99)
Glucose-Capillary: 177 mg/dL — ABNORMAL HIGH (ref 65–99)

## 2016-11-26 NOTE — Patient Care Conference (Signed)
Inpatient RehabilitationTeam Conference and Plan of Care Update Date: 11/26/2016   Time: 10:30 AM    Patient Name: Eric Morton      Medical Record Number: [id number]  Date of Birth: 12-29-1965 Sex: Male         Room/Bed: 4W20C/4W20C-01 Payor Info: Payor: MED PAY / Plan: MED PAY ASSURANCE / Product Type: *No Product type* /    Admitting Diagnosis: L CVA  Admit Date/Time:  10/27/2016  3:38 PM Admission Comments: No comment available   Primary Diagnosis:  Hemiparesis affecting right side as late effect of stroke ([place]) Principal Problem: Hemiparesis affecting right side as late effect of stroke [facility])  Patient Active Problem List   Diagnosis Date Noted  . Right hemiparesis ([place]) 11/25/2016  . Acute blood loss anemia   . Benign essential HTN   . Dysphagia, post-stroke   . Hemiparesis affecting right side as late effect of stroke ([place]) 10/29/2016  . Embolic cerebral infarction ([place]) 10/27/2016  . Alcohol use   . Cocaine use   . Dysarthria due to recent stroke   . Dysphagia due to recent stroke   . Left-sided weakness 10/23/2016  . Cerebral thrombosis with cerebral infarction 10/23/2016  . Aphasia   . Hyperlipidemia 07/27/2015  . Cerebrovascular accident (CVA) due to occlusion of cerebral artery ([place])   . Stroke ([place]) 03/21/2015  . Polycythemia vera ([place]) 03/21/2015  . Hx of adenomatous colonic polyps   . Dehydration   . Diarrhea   . Protein-calorie malnutrition ([place])   . Cellulitis of breast of male 09/25/2011  . HTN (hypertension) 09/25/2011  . DM (diabetes mellitus) ([place]) 09/25/2011  . Leukopenia 09/25/2011  . Alcohol abuse 09/25/2011  . Cocaine abuse ([place]) 09/25/2011  . Tobacco use 09/25/2011  . Neuropathy 09/25/2011    Expected Discharge Date: Expected Discharge Date: 11/28/16  Team Members Present: Physician leading conference: Dr. Alysia Penna Social Worker Present: Ovidio Kin, LCSW Nurse Present: Heather Roberts, RN PT Present: Phylliss Bob, PTA;Barrie Folk, PT OT Present: Napoleon Form, OT SLP Present: Stormy Fabian, SLP PPS Coordinator present : Daiva Nakayama, RN, CRRN     Current Status/Progress Goal Weekly Team Focus  Medical   continues with severe dysarthria, constipation, improving,  reduce fall risk, improve communication  . Discharge planning   Bowel/Bladder   Continent of bowel and bladder. LBM 11/25/16  Maintain continence,  Continue to monitor and address issues with B/B QS/prn if no BM within 3 days administer prn or po laxative   Swallow/Nutrition/ Hydration   dysphagia 3 with thin liquids  Supervision - upgraded 10/31  will attempt regular diet textures   ADL's   Min A overall for basic ADLs and functional transfers  Min A Overall  Standing balance, family ed (if going home), functional transfers   Mobility   minA stand pivot with HW, min guard squat pivot, gait min/modA up to 48ft with HW,   Min-mod assist with gait for short distances. supervision-min assist transfers with LRAD.   R NMR, standing balance, gait, family ed   Communication   Mod assist for verbal express - phrase level - Mod I use of communication board  Mod A  continue to address speech intelligibility with visual reference Mod I communication board   Safety/Cognition/ Behavioral Observations  Supervision  Supervision  basic problem solving   Pain   No c/o pain   pain < 3 on scale  Assess QS /prn and medicated with f/u   Skin   No new  skin issues reported  no reported skin issues  Assess QS and prn       *See Care Plan and progress notes for long and short-term goals.     Barriers to Discharge  Current Status/Progress Possible Resolutions Date Resolved   Physician    Inaccessible home environment;Decreased caregiver support;Insurance for SNF coverage  mainly social barriers at this point  progressing in therapy  . Ideally would go to SNF, although patient does not want to go out of town, this may be the only option      Nursing                   PT                    [place] for SNF coverage;Decreased caregiver support Declining leaving the local area-then needs to go home with sister or aunt Given until Friday to make decision-tentative dc friday          Discharge Planning/Teaching Needs:  Pt declining going out of the area, plans to go to sister's versus back to [place]. May or may not have care at either place.      Team Discussion:  Currently supervision transfers, min-mod ambulation. Stood for 5 minutes today in PT. Upgraded to Dys 3 thin. Pt planning to go home on Friday. Aware need recommendation 24 hr care. Refusing NHP now.  Revisions to Treatment Plan:  Home now 11/2    Continued Need for Acute Rehabilitation Level of Care: The patient requires daily medical management by a physician with specialized training in physical medicine and rehabilitation for the following conditions: Daily direction of a multidisciplinary physical rehabilitation program to ensure safe treatment while eliciting the highest outcome that is of practical value to the patient.: Yes Daily medical management of patient stability for increased activity during participation in an intensive rehabilitation regime.: Yes Daily analysis of laboratory values and/or radiology reports with any subsequent need for medication adjustment of medical intervention for : Neurological problems  Kenisha Lynds, Gardiner Rhyme 11/26/2016, 1:41 PM

## 2016-11-26 NOTE — Progress Notes (Signed)
Subjective/Complaints: Upgraded to D3 , intermittent buckling RLE  ROS: limited by severe dysarthria   Objective: Vital Signs: Blood pressure (!) 154/83, pulse 64, temperature (!) 97.5 F (36.4 C), temperature source Oral, resp. rate 18, height '5\' 7"'$  (1.702 m), weight 74.8 kg (165 lb), SpO2 99 %. No results found. Results for orders placed or performed during the hospital encounter of 10/27/16 (from the past 72 hour(s))  Glucose, capillary     Status: Abnormal   Collection Time: 11/23/16 12:10 PM  Result Value Ref Range   Glucose-Capillary 181 (H) 65 - 99 mg/dL  Glucose, capillary     Status: Abnormal   Collection Time: 11/23/16  4:40 PM  Result Value Ref Range   Glucose-Capillary 235 (H) 65 - 99 mg/dL  Glucose, capillary     Status: Abnormal   Collection Time: 11/23/16  9:44 PM  Result Value Ref Range   Glucose-Capillary 141 (H) 65 - 99 mg/dL  Creatinine, serum     Status: None   Collection Time: 11/24/16  5:12 AM  Result Value Ref Range   Creatinine, Ser 0.98 0.61 - 1.24 mg/dL   GFR calc non Af Amer >60 >60 mL/min   GFR calc Af Amer >60 >60 mL/min    Comment: (NOTE) The eGFR has been calculated using the CKD EPI equation. This calculation has not been validated in all clinical situations. eGFR's persistently <60 mL/min signify possible Chronic Kidney Disease.   Glucose, capillary     Status: None   Collection Time: 11/24/16  6:52 AM  Result Value Ref Range   Glucose-Capillary 95 65 - 99 mg/dL   Comment 1 Notify RN   Glucose, capillary     Status: Abnormal   Collection Time: 11/24/16 11:55 AM  Result Value Ref Range   Glucose-Capillary 252 (H) 65 - 99 mg/dL  Glucose, capillary     Status: Abnormal   Collection Time: 11/24/16  5:00 PM  Result Value Ref Range   Glucose-Capillary 154 (H) 65 - 99 mg/dL  Glucose, capillary     Status: Abnormal   Collection Time: 11/24/16  9:41 PM  Result Value Ref Range   Glucose-Capillary 240 (H) 65 - 99 mg/dL  Glucose, capillary      Status: None   Collection Time: 11/25/16  6:44 AM  Result Value Ref Range   Glucose-Capillary 88 65 - 99 mg/dL  Glucose, capillary     Status: Abnormal   Collection Time: 11/25/16 11:39 AM  Result Value Ref Range   Glucose-Capillary 341 (H) 65 - 99 mg/dL  Glucose, capillary     Status: Abnormal   Collection Time: 11/25/16  4:28 PM  Result Value Ref Range   Glucose-Capillary 190 (H) 65 - 99 mg/dL  Glucose, capillary     Status: Abnormal   Collection Time: 11/25/16  8:40 PM  Result Value Ref Range   Glucose-Capillary 308 (H) 65 - 99 mg/dL  Glucose, capillary     Status: None   Collection Time: 11/26/16  6:39 AM  Result Value Ref Range   Glucose-Capillary 93 65 - 99 mg/dL     HENT: Poor dentition. Normocephalic. Atraumatic. Eyes: exopthalmos, no ptosis,  EOMI Cardio: RRR without murmur. No JVD    Resp: CTA Bilaterally without wheezes or rales. Normal effort  GI: BS positive and ND Skin:   Intact. Warm and dry. Neuro: Motor tr -1/5 RUE scap protraction and retraction,trace biceps  3- R hip ext knee ext synergy--no changes -expressive language deficits, very dysarthric. uses  communication board Musc/Skel:  No edema. Carin Hock NAD    Assessment/Plan: 1. Functional deficits secondary to Right hemiparesis , dysarthria, dysphagia which require 3+ hours per day of interdisciplinary therapy in a comprehensive inpatient rehab setting. Physiatrist is providing close team supervision and 24 hour management of active medical problems listed below. Physiatrist and rehab team continue to assess barriers to discharge/monitor patient progress toward functional and medical goals. FIM: Function - Bathing Bathing activity did not occur: Refused Position: Shower Body parts bathed by patient: Right arm, Chest, Abdomen, Front perineal area, Buttocks, Right upper leg, Left upper leg Body parts bathed by helper: Left arm, Right lower leg, Left lower leg, Back Assist Level: Touching or steadying  assistance(Pt > 75%)  Function- Upper Body Dressing/Undressing What is the patient wearing?: Pull over shirt/dress Pull over shirt/dress - Perfomed by patient: Thread/unthread right sleeve, Thread/unthread left sleeve, Put head through opening Pull over shirt/dress - Perfomed by helper: Pull shirt over trunk Assist Level: More than reasonable time Function - Lower Body Dressing/Undressing What is the patient wearing?: Socks, Shoes, Pants, Underwear Position: Wheelchair/chair at [employer] - Performed by patient: Thread/unthread left underwear leg Underwear - Performed by helper: Thread/unthread right underwear leg, Pull underwear up/down Pants- Performed by patient: Thread/unthread left pants leg Pants- Performed by helper: Thread/unthread right pants leg, Pull pants up/down Non-skid slipper socks- Performed by patient: Don/doff right sock, Don/doff left sock Non-skid slipper socks- Performed by helper: Don/doff left sock, Don/doff right sock Socks - Performed by patient: Don/doff left sock Socks - Performed by helper: Don/doff right sock Shoes - Performed by patient: Don/doff left shoe Shoes - Performed by helper: Don/doff right shoe, Fasten right, Fasten left Assist for footwear: Supervision/touching assist Assist for lower body dressing: Touching or steadying assistance (Pt > 75%)  Function - Toileting Toileting steps completed by patient: Adjust clothing prior to toileting, Adjust clothing after toileting, Performs perineal hygiene Toileting steps completed by helper: Adjust clothing prior to toileting, Performs perineal hygiene, Adjust clothing after toileting Toileting Assistive Devices: Grab bar or rail Assist level: Touching or steadying assistance (Pt.75%)  Function - Air cabin crew transfer assistive device: Radio broadcast assistant lift: Stedy Assist level to toilet: Supervision or verbal cues Assist level from toilet: Moderate assist (Pt 50 - 74%/lift or  lower)  Function - Chair/bed transfer Chair/bed transfer method: Stand pivot Chair/bed transfer assist level: Touching or steadying assistance (Pt > 75%) Chair/bed transfer assistive device: Armrests, Bedrails Chair/bed transfer details: Verbal cues for technique, Manual facilitation for weight shifting, Verbal cues for precautions/safety, Manual facilitation for placement, Verbal cues for sequencing  Function - Locomotion: Wheelchair Will patient use wheelchair at discharge?: Yes Type: Manual Max wheelchair distance: 150 Assist Level: Supervision or verbal cues Assist Level: Supervision or verbal cues Wheel 150 feet activity did not occur: Safety/medical concerns Assist Level: Supervision or verbal cues Turns around,maneuvers to table,bed, and toilet,negotiates 3% grade,maneuvers on rugs and over doorsills: Yes Function - Locomotion: Ambulation Assistive device: Walker-hemi Max distance: 66f  Assist level: Moderate assist (Pt 50 - 74%) Assist level: Moderate assist (Pt 50 - 74%) Walk 50 feet with 2 turns activity did not occur: Safety/medical concerns Walk 150 feet activity did not occur: Safety/medical concerns Walk 10 feet on uneven surfaces activity did not occur: Safety/medical concerns  Function - Comprehension Comprehension: Auditory Comprehension assist level: Follows basic conversation/direction with extra time/assistive device  Function - Expression Expression: Verbal, Nonverbal Expression assistive device: Communication board Expression assist level: Expresses basic 50 - 74%  of the time/requires cueing 25 - 49% of the time. Needs to repeat parts of sentences.  Function - Social Interaction Social Interaction assist level: Interacts appropriately with others with medication or extra time (anti-anxiety, antidepressant).  Function - Problem Solving Problem solving assist level: Solves basic 90% of the time/requires cueing < 10% of the time  Function - Memory Memory  assist level: Recognizes or recalls 90% of the time/requires cueing < 10% of the time Patient normally able to recall (first 3 days only): Staff names and faces, That he or she is in a hospital, Location of own room, Current season   Medical Problem List and Plan: 1.  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 Formulized individual treatment plan based on medical history, underlying problem and comorbidities. 2.  DVT Prophylaxis/Anticoagulation: Subcutaneous Lovenox. Venous Doppler studies negative 3. Pain Management: Neurontin 300 mg daily at bedtime 4. Mood: Ego support 5. Neuropsych: This patient is capable of making decisions on his own behalf. 6. Skin/Wound Care:  Routine skin checks 7. Fluids/Electrolytes/Nutrition:  Routine I&Os 8. Dysphagia. D2 thin. Monitor hydration. Follow-up speech therapy  Advance diet as tolerated 9.Hypertension. Lisinopril increased to 83m on 10/12,   Amlodipine increased to 569m10/17  Elevated am systolic 1016/10ill monitor for now Vitals:   11/25/16 2200 11/26/16 0535  BP: 136/70 (!) 154/83  Pulse: 76 64  Resp:  18  Temp:  (!) 97.5 F (36.4 C)  SpO2: 100% 99%   10. Diabetes mellitus peripheral neuropathy. Hemoglobin A1c 8.9. SSI. Check blood sugars before meals and at bedtime. (PTA Patient on Glucotrol 5 mg twice a day, Glucophage 1000 mg twice a day).  Glucotrol 88m66mam, pm CBG remain elevated,Changed to  ER formulation will increase to 70m288marting 10/31  CBG (last 3)   Recent Labs  11/25/16 1628 11/25/16 2040 11/26/16 0639  GLUCAP 190* 308* 93   Elevated PM CBG 10/30.   -dietary education ongoing 11.Polysubstance abuse. UDS  positive for cocaine. Provide counseling with Neuropsych 12.History polycythemia.   Labs stable 13. ?PBA: Monitor.   improved on sertraline 14.  Buccal surface laceration likely related to dyscoordinated oral movements, left side over salivary duct: MMW 15.  Hypoalbuminemia-  prostat    LOS (Days) 30 A FACE TO FACE EVALUATION WAS PERFORMED  Kileen Lange E 11/26/2016, 10:05 AM

## 2016-11-26 NOTE — Progress Notes (Signed)
Occupational Therapy Weekly Progress Note  Patient Details  Name: Eric Morton MRN: [id number] Date of Birth: Jan 05, 1966  Beginning of progress report period: November 17, 2016 End of progress report period: November 26, 2016  Today's Date: 11/26/2016 OT Individual Time: 0900-1030 OT Individual Time Calculation (min): 90 min    Short term goals were not set as working towards LTG as await SNF placement. Pt cont to make slow but steady progress being made of min A overall.  Patient continues to demonstrate the following deficits: posture, ataxia, cognitive deficits, flaccid hemiplegia and hemiparesis and muscle weakness (generalized)  and therefore will continue to benefit from skilled OT intervention to enhance overall performance with BADL and Reduce care partner burden.  Patient progressing toward long term goals..  Continue plan of care.  OT Short Term Goals Week 4:  OT Short Term Goal 1 (Week 4): STG=LTG as awaiting SNF placement OT Short Term Goal 1 - Progress (Week 4): Progressing toward goal Week 5:  OT Short Term Goal 1 (Week 5): Cont to work towards LTG awaiting SNF placement  Skilled Therapeutic Interventions/Progress Updates:    Pt seen for OT session focusing on functional activity tolerance and community re-integration. Pt participated in [place] or Treating activity at hospital day care. He tolerated ~5 minutes standing trials while squating to hand out candy. Initiated need for seated rest breaks. He ambulated ~17ft with min A before requesting seated rest break. He returned to unit, completed toilet transfer and toileting task, min A for transfer to toilet and toileting task. He required mod A for return to w/c during stand pivot when stepping backwards. Pt left seated in w/c at end of session with RN present.   Therapy Documentation Precautions:  Precautions Precautions: Fall Restrictions Weight Bearing Restrictions: No Pain:   No/denies pain  See Function  Navigator for Current Functional Status.   Therapy/Group: Individual Therapy  Lewis, Rylea Selway C 11/26/2016, 7:09 AM

## 2016-11-26 NOTE — Progress Notes (Signed)
Physical Therapy Session Note  Patient Details  Name: PAULINO CORK MRN: [id number] Date of Birth: [birth year]  Today's Date: 11/26/2016 PT Individual Time: 1405-1510 PT Individual Time Calculation (min): 65 min   Short Term Goals: Week 4:  PT Short Term Goal 1 (Week 4): STG =LTG due to estimated discharge date.   Skilled Therapeutic Interventions/Progress Updates: Pt presented in w/c agreeable to therapy. Pt propelled to day room and participated in festive activities while engaging with staff and other pts. Pt propelled and maneuvered in narrow spaces while maintaining fair safety. Pt transported to rehab gym after activity and performed gait training 40ft x 1 and 9ft x 1 with modA and HW. Pt is demonstrating improving sequencing however continues to demonstrate compensatory strategies and R hyperextension with L step through. Continued pt edu for safety upon d/c and attempting to incorporate healthy living strategies. Pt returned to room and remained in w/c with call bell within reach and cousin present.      Therapy Documentation Precautions:  Precautions Precautions: Fall Restrictions Weight Bearing Restrictions: No General:   Vital Signs: Therapy Vitals Temp: 97.8 F (36.6 C) Temp Source: Oral Pulse Rate: 84 Resp: 18 BP: 136/77 Patient Position (if appropriate): Sitting Oxygen Therapy SpO2: 99 % O2 Device: Not Delivered   See Function Navigator for Current Functional Status.   Therapy/Group: Individual Therapy  Kanai Berrios  Abeeha Twist, PTA  11/26/2016, 4:03 PM

## 2016-11-26 NOTE — Progress Notes (Signed)
Speech Language Pathology Daily Session Note  Patient Details  Name: Eric Morton MRN: [id number] Date of Birth: 01-29-1965  Today's Date: 11/26/2016 SLP Individual Time: 0815-0900 SLP Individual Time Calculation (min): 45 min  Short Term Goals: Week 4: SLP Short Term Goal 1 (Week 4): Pt will utilize speech intelligibility strategies at word level with Mod A verbal cues to acheive 50% intelligibility.  SLP Short Term Goal 2 (Week 4): Pt will demonstrate functional problem solving for mildly complex tasks with Supervision verbal cues. SLP Short Term Goal 3 (Week 4): Pt will consume dysphagia 2 with thin liquids and minimal overt s/s of aspiration with supervision cues to use compensatory swallow strategies.  SLP Short Term Goal 4 (Week 4): Pt will perform 3 sets of 5 reps of pharyngeal strengthening exercises with Max A cues.   Skilled Therapeutic Interventions: Skilled treatment session focused on dysphagia and cognition goals. SLP facilitated session by providing skilled observation of pt consuming dysphagia 3 breakfast tray with thin liquids. Pt with good rate and ability to effectively clear oral cavity. Pt able to follow simple directions, responsive to being redirected to task. Pt was returned to room, left upright in wheelchair with all needs within reach. Continue per current plan of care.      Function:  Eating Eating   Modified Consistency Diet: Yes Eating Assist Level: Set up assist for;Supervision or verbal cues   Eating Set Up Assist For: Opening containers;Cutting food       Cognition Comprehension Comprehension assist level: Follows basic conversation/direction with extra time/assistive device  Expression Expression assistive device: Communication board Expression assist level: Expresses basic 50 - 74% of the time/requires cueing 25 - 49% of the time. Needs to repeat parts of sentences.  Social Interaction Social Interaction assist level: Interacts appropriately with  others with medication or extra time (anti-anxiety, antidepressant).  Problem Solving Problem solving assist level: Solves basic 90% of the time/requires cueing < 10% of the time  Memory Memory assist level: Recognizes or recalls 90% of the time/requires cueing < 10% of the time    Pain    Therapy/Group: Individual Therapy  [name] 11/26/2016, 10:42 AM

## 2016-11-26 NOTE — Progress Notes (Signed)
Patient refused liquifilm tears eye drops. Patient reports medication not needed. Continue with plan of care.  Eric Morton

## 2016-11-26 NOTE — Progress Notes (Signed)
Social Work Patient ID: Eric Morton, male   DOB: 11-02-1965, 51 y.o.   MRN: [id number] Spoke with [name] who has just gotten out of the hospital and is needing care. She reports he can not come back there he will need care and there is no one to provide this. She has spoken with him and will have her son speak with him since they are close. Will talk with pt about options again.

## 2016-11-26 NOTE — Progress Notes (Signed)
Social Work Patient ID: Eric Morton, male   DOB: 12/02/1965, 51 y.o.   MRN: [id number]  According to sister pt doesn't want to go to her home and out of town to [facility]. Brother to come in today to talk with pt and help him change his mind and go to NH out of town. His Aunt just released from hospital and requires care form her family members. Discussed pt needs to decide what he plans to do due to discharge moving forward. Continue to work on this.

## 2016-11-26 NOTE — Progress Notes (Signed)
Late entry. Patient educated on medications after discharge. Patient expressed he would take his medications at discharge. No family in for education today.educating patient on importance of aspiration precautions. Continue education of emergency situation if discharged to home. Patient unable to express how he would get medications or remain safe at discharge.Continue with plan of  Care.  Mliss Sax

## 2016-11-27 ENCOUNTER — Inpatient Hospital Stay (HOSPITAL_COMMUNITY): Payer: No Typology Code available for payment source | Admitting: Physical Therapy

## 2016-11-27 ENCOUNTER — Inpatient Hospital Stay (HOSPITAL_COMMUNITY): Payer: No Typology Code available for payment source | Admitting: Occupational Therapy

## 2016-11-27 ENCOUNTER — Inpatient Hospital Stay (HOSPITAL_COMMUNITY): Payer: Medicaid Other | Admitting: Speech Pathology

## 2016-11-27 LAB — GLUCOSE, CAPILLARY
GLUCOSE-CAPILLARY: 64 mg/dL — AB (ref 65–99)
GLUCOSE-CAPILLARY: 71 mg/dL (ref 65–99)
GLUCOSE-CAPILLARY: 288 mg/dL — AB (ref 65–99)
Glucose-Capillary: 210 mg/dL — ABNORMAL HIGH (ref 65–99)

## 2016-11-27 MED ORDER — AMLODIPINE BESYLATE 5 MG PO TABS
5.0000 mg | ORAL_TABLET | Freq: Every day | ORAL | Status: DC
  Administered 2016-11-27 – 2016-11-29 (×3): 5 mg via ORAL
  Filled 2016-11-27 (×3): qty 1

## 2016-11-27 MED ORDER — GLIPIZIDE 5 MG PO TABS
10.0000 mg | ORAL_TABLET | Freq: Every day | ORAL | Status: DC
  Administered 2016-11-28 – 2016-11-29 (×2): 10 mg via ORAL
  Filled 2016-11-27 (×2): qty 2

## 2016-11-27 NOTE — Progress Notes (Signed)
Occupational Therapy Session Note  Patient Details  Name: Eric Morton MRN: [id number] Date of Birth: September 29, 1965  Today's Date: 11/27/2016 OT Individual Time: 6384-6659 1330-1406 and 1500-1520 OT Individual Time Calculation (min): 60 min and 36 min and 20 minutes    Short Term Goals: Week 5:  OT Short Term Goal 1 (Week 5): Cont to work towards LTG awaiting SNF placement  Skilled Therapeutic Interventions/Progress Updates:    Session One: Pt seen for OT ADL bathing/dressing session. Pt in supine upon arrival, easily awoken and agreeable to tx session. He completed squat pivot and stand pivot transfers throughout session with close supervision-CGA and use of grab bar when completing stand pivot transfer into shower. He bathed seated on 3-1 BSC, assist to obtain figure four position with R LE in order to wash LE. Support at R wrist and elbow provided while pt washed up/down L UE using R arm. He returned to w/c to dress, requiring increased assist to thread pants due to weak hip flexion when bent over to attempt to thread. He stood with use of hemi walker and mod steadying assist while he pulled up pants.  Pt taken to therapy day room at end of session for hand off to SLP. Discussed d/c planning and recommendation from medical team for SNF placement. Pt reports that his sister will be here later today and he will discuss it with her.  Session Two: Therapist returned at 1300 for scheduled tx session. Pt just receiving lunch tray and refusing tx session until he had eaten lunch. He refused working on self feeding with R UE and requested therapist to return at later time.  Pt ready at 1330 for tx session. Pt desiring to discuss d/c planning and with use of communication board for ~50% of words, pt made desire known he does not want to go to [facility] for SNF as well as letting therapist know he has "changed" his lifestyle ways and wants to be given a chance to live on his own. Discussed prior behaviors  with alcohol and cocaine and significantly increased risk for recurrent CVA with those behaviors, pt voiced understanding and again said he has changed. He requested this therapist return at end of day to discuss d/c with pt and CSW.   Session Three: Therapist returned at 1500, cont d/c discussion with pt and CSW. All available options laid out for pt and discussed pros/cons of each option. Pt adamant about not going to [facility], however, does not know another option.  Pt plans to meet with brother and sister who are visiting tonight to discuss d/c disposition. Pt left in therapy day room to cont discussion with    Therapy Documentation Precautions:  Precautions Precautions: Fall Restrictions Weight Bearing Restrictions: No Pain:   No/ denies pain  See Function Navigator for Current Functional Status.   Therapy/Group: Individual Therapy  Lewis, Alonna Bartling C 11/27/2016, 7:10 AM

## 2016-11-27 NOTE — Progress Notes (Signed)
Physical Therapy Session Note  Patient Details  Name: Eric Morton MRN: [id number] Date of Birth: 11/13/1965  Today's Date: 11/27/2016 PT Individual Time: 1015-1100 PT Individual Time Calculation (min): 45 min   Short Term Goals: Week 4:  PT Short Term Goal 1 (Week 4): STG =LTG due to estimated discharge date.   Skilled Therapeutic Interventions/Progress Updates:   Pt received sitting in WC and agreeable to PT  WC mobility with supervision assist x 28f with min cues for doorway management.   Car transfer training completed x 2 with min-mod assist from PT. Moderate cues from PT for proper set up and UE support.   Stair negotiation training x 6 with min-mod assist from PT. Moderate cues for improved posture and gait pattern from PT.   Gait training x 618fwith HW and Min assist overall and intermittent mod assist due to R knee collapse.   Throughout treatment, pt performed sit<>stand with min assist from PT and only min cues for safety and sequencing.   [facility] returned to [facility] and [facility] and all needs met.          Therapy Documentation Precautions:  Precautions Precautions: Fall Restrictions Weight Bearing Restrictions: No Pain: 0/10   See Function Navigator for Current Functional Status.   Therapy/Group: Individual Therapy  AuLorie Phenix1/01/2016, 11:06 AM

## 2016-11-27 NOTE — Progress Notes (Signed)
Speech Language Pathology Daily Session Note  Patient Details  Name: Eric Morton MRN: [id number] Date of Birth: 01/26/1966  Today's Date: 11/27/2016 SLP Individual Time: 0830-0900 SLP Individual Time Calculation (min): 30 min  Short Term Goals: Week 4: SLP Short Term Goal 1 (Week 4): Pt will utilize speech intelligibility strategies at word level with Mod A verbal cues to acheive 50% intelligibility.  SLP Short Term Goal 2 (Week 4): Pt will demonstrate functional problem solving for mildly complex tasks with Supervision verbal cues. SLP Short Term Goal 3 (Week 4): Pt will consume dysphagia 2 with thin liquids and minimal overt s/s of aspiration with supervision cues to use compensatory swallow strategies.  SLP Short Term Goal 4 (Week 4): Pt will perform 3 sets of 5 reps of pharyngeal strengthening exercises with Max A cues.   Skilled Therapeutic Interventions: Skilled treatment session focused on dysphagia and speech communication goals. SLP facilitated session by providing skilled observation of pt consuming dysphagia 3 breakfast tray with thin liquids. Pt able to use strategies with intermittent supervision cues. Given Min A cues, pt able to communicate wants and needs verbally with gestural supplements to achieve ~ 75% of wants/needs. Pt able to self-monitor and attempts self-correcting. Pt returned to room, left upright in wheelchair and all needs within reach. Continue per current plan of care.      Function:  Eating Eating   Modified Consistency Diet: Yes Eating Assist Level: Set up assist for;Supervision or verbal cues   Eating Set Up Assist For: Opening containers;Cutting food       Cognition Comprehension Comprehension assist level: Follows basic conversation/direction with extra time/assistive device  Expression Expression assistive device: Communication board Expression assist level: Expresses basic 50 - 74% of the time/requires cueing 25 - 49% of the time. Needs to repeat  parts of sentences.  Social Interaction Social Interaction assist level: Interacts appropriately with others with medication or extra time (anti-anxiety, antidepressant).  Problem Solving Problem solving assist level: Solves basic 90% of the time/requires cueing < 10% of the time  Memory Memory assist level: Recognizes or recalls 90% of the time/requires cueing < 10% of the time    Pain    Therapy/Group: Individual Therapy  Morgyn Marut 11/27/2016, 9:51 AM

## 2016-11-27 NOTE — Progress Notes (Signed)
Social Work Patient ID: Eric Morton, male   DOB: Mar 22, 1965, 51 y.o.   MRN: [id number]  Met with pt to inform conversation with his [name] and how she is not letting him come to her home upon discharge. Asked him where he will go now. He is thinking about it. Have spoken with his sister and she wants him to go to a NH this is the best option for him. She and the rest of the family plan to come in tongiht @ 7:00 to discuss with him going to a NH. All are aware can not pursue NHP until pt is in agreement. His Aunt recently got out of the hospital and needs care herself. Pt upset about all of this and will call his Aunt himself to find out what she said.

## 2016-11-27 NOTE — Significant Event (Signed)
Pt refused his blood sugar to be taken and refused any education.  Was told to get out.

## 2016-11-27 NOTE — Progress Notes (Signed)
Social Work Patient ID: Eric Morton, male   DOB: 03-12-1965, 51 y.o.   MRN: [id number]  Pt met with myself and Amy-OT discussing again not wanting to go to a NH and he could manage at his Aunt's with his girlfriend-[name] helping him. His brother and sister coming tonight to discuss a plan and try to convince him to go to a NH for a short time. Asked him if plans to go to Aunt's she said he could not come there and who would take him there due to he has equipment-wheelchair. He thinks his brother will. Will discuss tonight and tell this worker in am. If going home will be discharged tomorrow. He is aware of this.

## 2016-11-27 NOTE — Progress Notes (Signed)
Nelda Marseille called ph [phone]. Said that pt has called several times this morning stating that coming home. Requesting call back. Gave msg to Education officer, museum.

## 2016-11-27 NOTE — Progress Notes (Signed)
Subjective/Complaints: Discussed team rec for SNF rather than home with family due to increased freq of PT, OT, SLP  ROS: limited by severe dysarthria   Objective: Vital Signs: Blood pressure (!) 154/83, pulse 62, temperature 97.6 F (36.4 C), temperature source Oral, resp. rate 18, height 5\' 7"  (1.702 m), weight 74.8 kg (165 lb), SpO2 99 %. No results found. Results for orders placed or performed during the hospital encounter of 10/27/16 (from the past 72 hour(s))  Glucose, capillary     Status: Abnormal   Collection Time: 11/24/16 11:55 AM  Result Value Ref Range   Glucose-Capillary 252 (H) 65 - 99 mg/dL  Glucose, capillary     Status: Abnormal   Collection Time: 11/24/16  5:00 PM  Result Value Ref Range   Glucose-Capillary 154 (H) 65 - 99 mg/dL  Glucose, capillary     Status: Abnormal   Collection Time: 11/24/16  9:41 PM  Result Value Ref Range   Glucose-Capillary 240 (H) 65 - 99 mg/dL  Glucose, capillary     Status: None   Collection Time: 11/25/16  6:44 AM  Result Value Ref Range   Glucose-Capillary 88 65 - 99 mg/dL  Glucose, capillary     Status: Abnormal   Collection Time: 11/25/16 11:39 AM  Result Value Ref Range   Glucose-Capillary 341 (H) 65 - 99 mg/dL  Glucose, capillary     Status: Abnormal   Collection Time: 11/25/16  4:28 PM  Result Value Ref Range   Glucose-Capillary 190 (H) 65 - 99 mg/dL  Glucose, capillary     Status: Abnormal   Collection Time: 11/25/16  8:40 PM  Result Value Ref Range   Glucose-Capillary 308 (H) 65 - 99 mg/dL  Glucose, capillary     Status: None   Collection Time: 11/26/16  6:39 AM  Result Value Ref Range   Glucose-Capillary 93 65 - 99 mg/dL  Glucose, capillary     Status: Abnormal   Collection Time: 11/26/16 11:32 AM  Result Value Ref Range   Glucose-Capillary 230 (H) 65 - 99 mg/dL  Glucose, capillary     Status: Abnormal   Collection Time: 11/26/16  4:43 PM  Result Value Ref Range   Glucose-Capillary 216 (H) 65 - 99 mg/dL   Glucose, capillary     Status: Abnormal   Collection Time: 11/26/16  8:46 PM  Result Value Ref Range   Glucose-Capillary 177 (H) 65 - 99 mg/dL  Glucose, capillary     Status: Abnormal   Collection Time: 11/27/16  6:39 AM  Result Value Ref Range   Glucose-Capillary 64 (L) 65 - 99 mg/dL  Glucose, capillary     Status: None   Collection Time: 11/27/16  7:38 AM  Result Value Ref Range   Glucose-Capillary 71 65 - 99 mg/dL     HENT: Poor dentition. Normocephalic. Atraumatic. Eyes: exopthalmos, no ptosis,  EOMI Cardio: RRR without murmur. No JVD    Resp: CTA Bilaterally without wheezes or rales. Normal effort  GI: BS positive and ND Skin:   Intact. Warm and dry. Neuro: Motor tr -1/5 RUE scap protraction and retraction,trace biceps  3- R hip ext knee ext synergy--no changes -expressive language deficits, very dysarthric. uses communication board Musc/Skel:  No edema. Carin Hock NAD    Assessment/Plan: 1. Functional deficits secondary to Right hemiparesis , dysarthria, dysphagia which require 3+ hours per day of interdisciplinary therapy in a comprehensive inpatient rehab setting. Physiatrist is providing close team supervision and 24 hour management of active medical  problems listed below. Physiatrist and rehab team continue to assess barriers to discharge/monitor patient progress toward functional and medical goals. FIM: Function - Bathing Bathing activity did not occur: Refused Position: Shower Body parts bathed by patient: Right arm, Chest, Abdomen, Front perineal area, Buttocks, Right upper leg, Left upper leg, Right lower leg, Left lower leg, Left arm Body parts bathed by helper: Back Assist Level: Touching or steadying assistance(Pt > 75%)  Function- Upper Body Dressing/Undressing What is the patient wearing?: Pull over shirt/dress Pull over shirt/dress - Perfomed by patient: Thread/unthread right sleeve, Thread/unthread left sleeve, Put head through opening, Pull shirt over  trunk Pull over shirt/dress - Perfomed by helper: Pull shirt over trunk Assist Level: More than reasonable time Function - Lower Body Dressing/Undressing What is the patient wearing?: Socks, Shoes, Pants, Underwear Position: Wheelchair/chair at [employer] - Performed by patient: Pull underwear up/down, Thread/unthread right underwear leg, Thread/unthread left underwear leg Underwear - Performed by helper: Thread/unthread right underwear leg, Pull underwear up/down Pants- Performed by patient: Pull pants up/down, Thread/unthread left pants leg Pants- Performed by helper: Thread/unthread right pants leg Non-skid slipper socks- Performed by patient: Don/doff right sock, Don/doff left sock Non-skid slipper socks- Performed by helper: Don/doff left sock, Don/doff right sock Socks - Performed by patient: Don/doff left sock Socks - Performed by helper: Don/doff right sock Shoes - Performed by patient: Don/doff left shoe Shoes - Performed by helper: Don/doff right shoe, Fasten right, Fasten left Assist for footwear: Supervision/touching assist Assist for lower body dressing: Touching or steadying assistance (Pt > 75%)  Function - Toileting Toileting steps completed by patient: Adjust clothing prior to toileting, Adjust clothing after toileting, Performs perineal hygiene Toileting steps completed by helper: Adjust clothing prior to toileting, Performs perineal hygiene, Adjust clothing after toileting Toileting Assistive Devices: Grab bar or rail Assist level: Touching or steadying assistance (Pt.75%)  Function - Air cabin crew transfer assistive device: Walker (Hemi-walker) Mechanical lift: Stedy Assist level to toilet: Touching or steadying assistance (Pt > 75%) Assist level from toilet: Touching or steadying assistance (Pt > 75%)  Function - Chair/bed transfer Chair/bed transfer method: Stand pivot Chair/bed transfer assist level: Touching or steadying assistance (Pt >  75%) Chair/bed transfer assistive device: Armrests, Bedrails Chair/bed transfer details: Verbal cues for technique, Manual facilitation for weight shifting, Verbal cues for precautions/safety, Manual facilitation for placement, Verbal cues for sequencing  Function - Locomotion: Wheelchair Will patient use wheelchair at discharge?: Yes Type: Manual Max wheelchair distance: 150 Assist Level: Supervision or verbal cues Assist Level: Supervision or verbal cues Wheel 150 feet activity did not occur: Safety/medical concerns Assist Level: Supervision or verbal cues Turns around,maneuvers to table,bed, and toilet,negotiates 3% grade,maneuvers on rugs and over doorsills: Yes Function - Locomotion: Ambulation Assistive device: Walker-hemi Max distance: 18ft Assist level: Moderate assist (Pt 50 - 74%) Assist level: Moderate assist (Pt 50 - 74%) Walk 50 feet with 2 turns activity did not occur: Safety/medical concerns Walk 150 feet activity did not occur: Safety/medical concerns Walk 10 feet on uneven surfaces activity did not occur: Safety/medical concerns  Function - Comprehension Comprehension: Auditory Comprehension assist level: Follows basic conversation/direction with extra time/assistive device  Function - Expression Expression: Verbal, Nonverbal Expression assistive device: Communication board Expression assist level: Expresses basic 50 - 74% of the time/requires cueing 25 - 49% of the time. Needs to repeat parts of sentences.  Function - Social Interaction Social Interaction assist level: Interacts appropriately with others with medication or extra time (anti-anxiety, antidepressant).  Function -  Problem Solving Problem solving assist level: Solves basic 90% of the time/requires cueing < 10% of the time  Function - Memory Memory assist level: Recognizes or recalls 90% of the time/requires cueing < 10% of the time Patient normally able to recall (first 3 days only): Staff names  and faces, That he or she is in a hospital, Location of own room, Current season   Medical Problem List and Plan: 1.  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  DVT Prophylaxis/Anticoagulation: Subcutaneous Lovenox. Venous Doppler studies negative 3. Pain Management: Neurontin 300 mg daily at bedtime 4. Mood: Ego support 5. Neuropsych: This patient is capable of making decisions on his own behalf. 6. Skin/Wound Care:  Routine skin checks 7. Fluids/Electrolytes/Nutrition:  Routine I&Os 8. Dysphagia. D2 thin. Monitor hydration. Follow-up speech therapy  Advance diet as tolerated 9.Hypertension. Lisinopril increased to 20mg  on 10/12,   Amlodipine increased to 5mg  10/17  Elevated am systolic 34/1 will monitor for now Vitals:   11/27/16 0522 11/27/16 0642  BP: (!) 167/82 (!) 154/83  Pulse: 62   Resp: 18   Temp: 97.6 F (36.4 C)   SpO2: 99%    10. Diabetes mellitus peripheral neuropathy. Hemoglobin A1c 8.9. SSI. Check blood sugars before meals and at bedtime. (PTA Patient on Glucotrol 5 mg twice a day, Glucophage 1000 mg twice a day).  Glucotrol 5mg  qam, pm CBG remain elevated,Changed to  ER formulation will increase to 10mg  starting 10/31  CBG (last 3)   Recent Labs  11/26/16 2046 11/27/16 0639 11/27/16 0738  GLUCAP 177* 64* 71   AM CBG running lower will change glucotrol XL to  glucotrol  -dietary education ongoing 11.Polysubstance abuse. UDS positive for cocaine. Provide counseling with Neuropsych 12.History polycythemia.   Labs stable 13. ?PBA: Monitor.   improved on sertraline 14.  Buccal surface laceration likely related to dyscoordinated oral movements, left side over salivary duct: MMW 15.  Hypoalbuminemia-  prostat    LOS (Days) 31 A FACE TO FACE EVALUATION WAS PERFORMED  KIRSTEINS,ANDREW  E 11/27/2016, 8:36 AM

## 2016-11-28 ENCOUNTER — Inpatient Hospital Stay (HOSPITAL_COMMUNITY): Payer: No Typology Code available for payment source | Admitting: Physical Therapy

## 2016-11-28 ENCOUNTER — Inpatient Hospital Stay (HOSPITAL_COMMUNITY): Payer: Medicaid Other | Admitting: Speech Pathology

## 2016-11-28 ENCOUNTER — Inpatient Hospital Stay (HOSPITAL_COMMUNITY): Payer: No Typology Code available for payment source | Admitting: Occupational Therapy

## 2016-11-28 LAB — GLUCOSE, CAPILLARY
GLUCOSE-CAPILLARY: 145 mg/dL — AB (ref 65–99)
GLUCOSE-CAPILLARY: 171 mg/dL — AB (ref 65–99)
GLUCOSE-CAPILLARY: 246 mg/dL — AB (ref 65–99)
Glucose-Capillary: 112 mg/dL — ABNORMAL HIGH (ref 65–99)
Glucose-Capillary: 171 mg/dL — ABNORMAL HIGH (ref 65–99)

## 2016-11-28 MED ORDER — ASPIRIN 81 MG PO CHEW: 81.0000 mg | CHEWABLE_TABLET | Freq: Every day | ORAL | 11 refills | Status: AC

## 2016-11-28 MED ORDER — GABAPENTIN 300 MG PO CAPS: 300.0000 mg | ORAL_CAPSULE | Freq: Every day | ORAL | 0 refills | Status: DC

## 2016-11-28 MED ORDER — FOLIC ACID 1 MG PO TABS: 1.0000 mg | ORAL_TABLET | Freq: Every day | ORAL | 1 refills | Status: DC

## 2016-11-28 MED ORDER — CLOPIDOGREL BISULFATE 75 MG PO TABS: 75.0000 mg | ORAL_TABLET | Freq: Every day | ORAL | 2 refills | Status: DC

## 2016-11-28 MED ORDER — ROSUVASTATIN CALCIUM 40 MG PO TABS: 40.0000 mg | ORAL_TABLET | Freq: Every day | ORAL | 2 refills | Status: DC

## 2016-11-28 MED ORDER — SERTRALINE HCL 25 MG PO TABS: 25.0000 mg | ORAL_TABLET | Freq: Every day | ORAL | 0 refills | Status: DC

## 2016-11-28 MED ORDER — ADULT MULTIVITAMIN W/MINERALS CH: 1.0000 | ORAL_TABLET | Freq: Every day | ORAL | Status: DC

## 2016-11-28 MED ORDER — AMLODIPINE BESYLATE 5 MG PO TABS: 5.0000 mg | ORAL_TABLET | Freq: Every day | ORAL | 0 refills | Status: DC

## 2016-11-28 MED ORDER — LISINOPRIL 20 MG PO TABS: 20.0000 mg | ORAL_TABLET | Freq: Every day | ORAL | 0 refills | Status: DC

## 2016-11-28 MED ORDER — PANTOPRAZOLE SODIUM 40 MG PO TBEC: 40.0000 mg | DELAYED_RELEASE_TABLET | Freq: Every day | ORAL | 0 refills | Status: DC

## 2016-11-28 MED ORDER — GLIPIZIDE 10 MG PO TABS: 10.0000 mg | ORAL_TABLET | Freq: Every day | ORAL | 0 refills | Status: DC

## 2016-11-28 NOTE — Progress Notes (Signed)
Occupational Therapy Session Note  Patient Details  Name: Eric Morton MRN: [id number] Date of Birth: 1965/10/12  Today's Date: 11/28/2016 OT Individual Time: 1115-1200 OT Individual Time Calculation (min): 45 min    Short Term Goals: Week 5:  OT Short Term Goal 1 (Week 5): Cont to work towards LTG awaiting SNF placement  Skilled Therapeutic Interventions/Progress Updates:    Pt has refused to go to a SNF so he will now be leaving tomorrow to go home.  Pt received in room and was in a positive mood with his decision.  Attempted for 10 min to try to have pt take a shower or a bath but he refused.  He does not like to bathe daily and received a shower yesterday. Pt indicated on his communication board that he wanted to work on his arm.   Pt taken to gym and completed a squat pivot transfer to his functional L side with S.  (the return transfer to w/c to his affected R side required MIN A and MIN cues as he forgot to move L arm rest and was reaching across his body for R arm rest)  Pt sat on mat and worked on A/arom for his shoulder, elbow and fingers with ball rolls, UE Ranger, hand over hand guiding grasp and release of cones and bean bags. Pt now has improved finger AROM.  He is using flexor synergy patterns to lift his hand towards his mouth.  He was given a foam block to work with at home.   Pt participated well. Pt self propelled w/c back to his room.   Therapy Documentation Precautions:  Precautions Precautions: Fall Restrictions Weight Bearing Restrictions: No      Pain: Pain Assessment Pain Assessment: No/denies pain ADL:    See Function Navigator for Current Functional Status.   Therapy/Group: Individual Therapy  SAGUIER,JULIA 11/28/2016, 12:28 PM

## 2016-11-28 NOTE — Progress Notes (Signed)
Social Work  Discharge Note  The overall goal for the admission was met for: DC-SAT 11/3  Discharge location: Yes-HOME TO SISTER'S APARTMENT  Length of Stay: No-32 DAYS  Discharge activity level: Yes-SUPERVISION [place]  Home/community participation: Yes  Services provided included: MD, RD, PT, OT, SLP, RN, CM, TR, Pharmacy, Neuropsych and SW  Financial Services: Other: PENDING MEDICAID  Follow-up services arranged: Home Health: [facility] CARE-PT,OT,SP,RN,SW,AIDE, DME: [facility] and Patient/Family has no preference for HH/DME agencies  Comments (or additional information):PT REFUSED NHP AND THEN HAD TO COME UP WITH A DISCHARGE PLAN FOR HIMSELF. SISTER VOICED WOULD ASSIST WITH CARE BUT WORKS DURING THE DAY, SO WILL BE ALONE. NOT AN OPTION TO GO BACK HOME TO AUNT'S HOME. DECLINED SUBSTANCE ABUSE RESOURCES. PENDING MEDICAID AND DISABILITY SISTER TO FOLLOW UP WITH. PT STRONGLY ENCOURAGED TO BE COMPLIANT WITH HIS DIABETES AND MEDICATIONS, HE HAS NOT BEEN IN THE PAST-REASON HE IS HERE. MATCH GIVEN TO PT DUE TO REPORTS ORANGE CARD HAS EXPIRED. WILL SET UP FOLLOW UP PCP [facility] ON Monday TRIED TO AND REPORTED TO CALL BACK Monday. PT IS FOLLOWED THERE. PT TO GO HOME VIA AMBULANCE DUE TO STAIRS INTO APARTMENT. SIGNAL THREE INFORMATION GIVEN TO SISTER.  Patient/Family verbalized understanding of follow-up arrangements: Yes  Individual responsible for coordination of the follow-up plan: [name]-SISTER  Confirmed correct DME delivered: Elease Hashimoto 11/28/2016    Elease Hashimoto

## 2016-11-28 NOTE — Progress Notes (Signed)
Pt sister has not shown up for d/c instructions to be provided. Gv d/c instructions to night nurse with PA contact and instructions for d/c tomorrow.

## 2016-11-28 NOTE — Progress Notes (Signed)
Subjective/Complaints: Discharge plan now sent for discharge with his sister.  ROS: limited by severe dysarthria   Objective: Vital Signs: Blood pressure 132/76, pulse 80, temperature 98.2 F (36.8 C), temperature source Oral, resp. rate 18, height 5\' 7"  (1.702 m), weight 74.8 kg (165 lb), SpO2 100 %. No results found. Results for orders placed or performed during the hospital encounter of 10/27/16 (from the past 72 hour(s))  Glucose, capillary     Status: Abnormal   Collection Time: 11/25/16  8:40 PM  Result Value Ref Range   Glucose-Capillary 308 (H) 65 - 99 mg/dL  Glucose, capillary     Status: None   Collection Time: 11/26/16  6:39 AM  Result Value Ref Range   Glucose-Capillary 93 65 - 99 mg/dL  Glucose, capillary     Status: Abnormal   Collection Time: 11/26/16 11:32 AM  Result Value Ref Range   Glucose-Capillary 230 (H) 65 - 99 mg/dL  Glucose, capillary     Status: Abnormal   Collection Time: 11/26/16  4:43 PM  Result Value Ref Range   Glucose-Capillary 216 (H) 65 - 99 mg/dL  Glucose, capillary     Status: Abnormal   Collection Time: 11/26/16  8:46 PM  Result Value Ref Range   Glucose-Capillary 177 (H) 65 - 99 mg/dL  Glucose, capillary     Status: Abnormal   Collection Time: 11/27/16  6:39 AM  Result Value Ref Range   Glucose-Capillary 64 (L) 65 - 99 mg/dL  Glucose, capillary     Status: None   Collection Time: 11/27/16  7:38 AM  Result Value Ref Range   Glucose-Capillary 71 65 - 99 mg/dL  Glucose, capillary     Status: Abnormal   Collection Time: 11/27/16  4:25 PM  Result Value Ref Range   Glucose-Capillary 288 (H) 65 - 99 mg/dL  Glucose, capillary     Status: Abnormal   Collection Time: 11/27/16  9:18 PM  Result Value Ref Range   Glucose-Capillary 210 (H) 65 - 99 mg/dL  Glucose, capillary     Status: Abnormal   Collection Time: 11/28/16  6:29 AM  Result Value Ref Range   Glucose-Capillary 112 (H) 65 - 99 mg/dL  Glucose, capillary     Status: Abnormal   Collection Time: 11/28/16 12:07 PM  Result Value Ref Range   Glucose-Capillary 171 (H) 65 - 99 mg/dL  Glucose, capillary     Status: Abnormal   Collection Time: 11/28/16  3:38 PM  Result Value Ref Range   Glucose-Capillary 145 (H) 65 - 99 mg/dL  Glucose, capillary     Status: Abnormal   Collection Time: 11/28/16  4:25 PM  Result Value Ref Range   Glucose-Capillary 171 (H) 65 - 99 mg/dL     HENT: Poor dentition. Normocephalic. Atraumatic. Eyes: exopthalmos, no ptosis,  EOMI Cardio: RRR without murmur. No JVD    Resp: CTA Bilaterally without wheezes or rales. Normal effort  GI: BS positive and ND Skin:   Intact. Warm and dry. Neuro: Motor tr -1/5 RUE scap protraction and retraction,trace biceps  3- R hip ext knee ext synergy--no changes -expressive language deficits, very dysarthric. uses communication board Musc/Skel:  No edema. Carin Hock NAD    Assessment/Plan: 1. Functional deficits secondary to Right hemiparesis , dysarthria, dysphagia which require 3+ hours per day of interdisciplinary therapy in a comprehensive inpatient rehab setting. Physiatrist is providing close team supervision and 24 hour management of active medical problems listed below. Physiatrist and rehab team continue to  assess barriers to discharge/monitor patient progress toward functional and medical goals. FIM: Function - Bathing Bathing activity did not occur: Refused Position: Shower Body parts bathed by patient: Right arm, Chest, Abdomen, Front perineal area, Buttocks, Right upper leg, Left upper leg, Right lower leg, Left lower leg, Left arm Body parts bathed by helper: Back Assist Level: Touching or steadying assistance(Pt > 75%)  Function- Upper Body Dressing/Undressing What is the patient wearing?: Pull over shirt/dress Pull over shirt/dress - Perfomed by patient: Thread/unthread right sleeve, Thread/unthread left sleeve, Put head through opening, Pull shirt over trunk Pull over shirt/dress - Perfomed  by helper: Pull shirt over trunk Assist Level: More than reasonable time Function - Lower Body Dressing/Undressing What is the patient wearing?: Pants, Underwear, Non-skid slipper socks Position: Wheelchair/chair at sink Underwear - Performed by patient: Pull underwear up/down, Thread/unthread right underwear leg, Thread/unthread left underwear leg Underwear - Performed by helper: Thread/unthread right underwear leg, Pull underwear up/down Pants- Performed by patient: Pull pants up/down, Thread/unthread left pants leg Pants- Performed by helper: Thread/unthread right pants leg Non-skid slipper socks- Performed by patient: Don/doff right sock, Don/doff left sock Non-skid slipper socks- Performed by helper: Don/doff left sock, Don/doff right sock Socks - Performed by patient: Don/doff left sock Socks - Performed by helper: Don/doff right sock Shoes - Performed by patient: Don/doff left shoe Shoes - Performed by helper: Don/doff right shoe, Fasten right, Fasten left Assist for footwear: Supervision/touching assist Assist for lower body dressing: Touching or steadying assistance (Pt > 75%)  Function - Toileting Toileting steps completed by patient: Adjust clothing prior to toileting, Performs perineal hygiene, Adjust clothing after toileting Toileting steps completed by helper: Adjust clothing prior to toileting, Performs perineal hygiene, Adjust clothing after toileting Toileting Assistive Devices: Grab bar or rail Assist level: Touching or steadying assistance (Pt.75%)  Function - Air cabin crew transfer assistive device: Grab bar Mechanical lift: Stedy Assist level to toilet: Touching or steadying assistance (Pt > 75%) Assist level from toilet: Touching or steadying assistance (Pt > 75%)  Function - Chair/bed transfer Chair/bed transfer method: Stand pivot Chair/bed transfer assist level: Touching or steadying assistance (Pt > 75%) Chair/bed transfer assistive device:  Armrests Chair/bed transfer details: Verbal cues for technique, Manual facilitation for weight shifting, Verbal cues for precautions/safety, Manual facilitation for placement, Verbal cues for sequencing  Function - Locomotion: Wheelchair Will patient use wheelchair at discharge?: Yes Type: Manual Max wheelchair distance: 262ft  Assist Level: Supervision or verbal cues Assist Level: Supervision or verbal cues Wheel 150 feet activity did not occur: Safety/medical concerns Assist Level: Supervision or verbal cues Turns around,maneuvers to table,bed, and toilet,negotiates 3% grade,maneuvers on rugs and over doorsills: Yes Function - Locomotion: Ambulation Assistive device: Walker-hemi Max distance: 29ft  Assist level: Moderate assist (Pt 50 - 74%) Assist level: Touching or steadying assistance (Pt > 75%) Walk 50 feet with 2 turns activity did not occur: Safety/medical concerns Assist level: Moderate assist (Pt 50 - 74%) Walk 150 feet activity did not occur: Safety/medical concerns Walk 10 feet on uneven surfaces activity did not occur: Safety/medical concerns  Function - Comprehension Comprehension: Auditory Comprehension assist level: Follows basic conversation/direction with extra time/assistive device  Function - Expression Expression: Verbal, Nonverbal Expression assistive device: Communication board Expression assist level: Expresses basic 50 - 74% of the time/requires cueing 25 - 49% of the time. Needs to repeat parts of sentences.  Function - Social Interaction Social Interaction assist level: Interacts appropriately with others with medication or extra time (anti-anxiety, antidepressant).  Function - Problem Solving Problem solving assist level: Solves basic 90% of the time/requires cueing < 10% of the time  Function - Memory Memory assist level: Recognizes or recalls 90% of the time/requires cueing < 10% of the time Patient normally able to recall (first 3 days only):  Staff names and faces, That he or she is in a hospital, Location of own room, Current season   Medical Problem List and Plan: 1.  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 11/29/2016 ,2.  DVT Prophylaxis/Anticoagulation: Subcutaneous Lovenox. Venous Doppler studies negative 3. Pain Management: Neurontin 300 mg daily at bedtime 4. Mood: Ego support 5. Neuropsych: This patient is capable of making decisions on his own behalf. 6. Skin/Wound Care:  Routine skin checks 7. Fluids/Electrolytes/Nutrition:  Routine I&Os 8. Dysphagia. D2 thin. Monitor hydration. Follow-up speech therapy  Advance diet as tolerated 9.Hypertension. Lisinopril increased to 20mg  on 10/12,   Amlodipine increased to 5mg  10/17   Vitals:   11/28/16 0541 11/28/16 1452  BP: (!) 153/81 132/76  Pulse: 67 80  Resp: 18 18  Temp: 97.7 F (36.5 C) 98.2 F (36.8 C)  SpO2: 100% 100%   10. Diabetes mellitus peripheral neuropathy. Hemoglobin A1c 8.9. SSI. Check blood sugars before meals and at bedtime. (PTA Patient on Glucotrol 5 mg twice a day, Glucophage 1000 mg twice a day).  Glucotrol 5mg  qam, pm CBG remain elevated,Changed to  ER formulation will increase to 10mg  starting 10/31  CBG (last 3)   Recent Labs  11/28/16 1207 11/28/16 1538 11/28/16 1625  GLUCAP 171* 145* 171*   AM CBG running lower will change glucotrol XL to  glucotrol  -dietary education ongoing 11.Polysubstance abuse. UDS positive for cocaine. Provide counseling with Neuropsych 12.History polycythemia.   Labs stable 13. ?PBA: Monitor.   improved on sertraline 14.  Buccal surface laceration likely related to dyscoordinated oral movements, left side over salivary duct: MMW 15.  Hypoalbuminemia-  prostat    LOS (Days) 32 A FACE TO FACE EVALUATION WAS  PERFORMED  KIRSTEINS,ANDREW E 11/28/2016, 5:04 PM

## 2016-11-28 NOTE — Discharge Instructions (Signed)
Inpatient Rehab Discharge Instructions  JAMS TRICKETT Discharge date and time: No discharge date for patient encounter.   Activities/Precautions/ Functional Status: Activity: activity as tolerated Diet: soft Wound Care: none needed Functional status:  ___ No restrictions     ___ Walk up steps independently ___ 24/7 supervision/assistance   ___ Walk up steps with assistance ___ Intermittent supervision/assistance  ___ Bathe/dress independently ___ Walk with walker     ___ Bathe/dress with assistance ___ Walk Independently    ___ Shower independently ___ Walk with assistance    _x__ Shower with assistance ___ No alcohol     ___ Return to work/school ________  Special Instructions: No driving, smoking, alcohol or illicit drug use  COMMUNITY REFERRALS UPON DISCHARGE:    Home Health:   PT, OT, SP, RN, SW, [place]   Date of last service:11/29/2016  Medical Equipment/Items Ordered:WHEELCHAIR, Lannette Donath COMMODE  Agency/Supplier:[facility]   [phone]  Other:DISABILITY AND MEDICAID APPLICATIONS ARE PENDING [place] [phone] (DISABILITY) Ronalee Red [id number] (MEDICAID) OR JASMIN-FINANCIAL COUNSELOR [id number]  MATCH PROGRAM FOR ASSISTANCE WITH MEDICATIONS DECLINED SUBSTANCE ABUSE RESOURCES  GENERAL COMMUNITY RESOURCES FOR PATIENT/FAMILY: Support Groups:[facility] SUPPORT GROUP EVERY SECOND Thursday @ 3:00-4:00 PM ON THE REHAB UNIT QUESTIONS CONTACT [place] [phone]  STROKE/TIA DISCHARGE INSTRUCTIONS SMOKING Cigarette smoking nearly doubles your risk of having a stroke & is the single most alterable risk factor  If you smoke or have smoked in the last 12 months, you are advised to quit smoking for your health.  Most of the excess cardiovascular risk related to smoking disappears within a year of stopping.  Ask you doctor about anti-smoking medications  [place] Quit Line: 1-800-QUIT NOW  Free  Smoking Cessation Classes [phone]  CHOLESTEROL Know your levels; limit fat & cholesterol in your diet  Lipid Panel     Component Value Date/Time   CHOL 195 10/23/2016 1013   CHOL 236 (H) 06/06/2016 0944   TRIG 161 (H) 10/23/2016 1013   HDL 64 10/23/2016 1013   HDL 63 06/06/2016 0944   CHOLHDL 3.0 10/23/2016 1013   VLDL 32 10/23/2016 1013   LDLCALC 99 10/23/2016 1013   LDLCALC 129 (H) 06/06/2016 0944      Many patients benefit from treatment even if their cholesterol is at goal.  Goal: Total Cholesterol (CHOL) less than 160  Goal:  Triglycerides (TRIG) less than 150  Goal:  HDL greater than 40  Goal:  LDL (LDLCALC) less than 100   BLOOD PRESSURE American Stroke Association blood pressure target is less that 120/80 mm/Hg  Your discharge blood pressure is:  BP: (!) 162/86  Monitor your blood pressure  Limit your salt and alcohol intake  Many individuals will require more than one medication for high blood pressure  DIABETES (A1c is a blood sugar average for last 3 months) Goal HGBA1c is under 7% (HBGA1c is blood sugar average for last 3 months)  Diabetes:     Lab Results  Component Value Date   HGBA1C 8.9 (H) 10/23/2016     Your HGBA1c can be lowered with medications, healthy diet, and exercise.  Check your blood sugar as directed by your physician  Call your physician if you experience unexplained or low blood sugars.  PHYSICAL ACTIVITY/REHABILITATION Goal is 30 minutes at least 4 days per week  Activity: Increase activity slowly, Therapies: Physical Therapy: Home Health Return to work:   Activity decreases your risk of heart attack and stroke and makes your heart stronger.  It helps control your weight and blood pressure; helps you relax and can improve your mood.  Participate in a regular exercise program.  Talk with your doctor about the best form of exercise for you (dancing, walking, swimming, cycling).  DIET/WEIGHT Goal is to maintain a healthy  weight  Your discharge diet is: DIET - DYS 1 Room service appropriate? Yes; Fluid consistency: Honey Thick  liquids Your height is:  Height: 5\' 7"  (170.2 cm) Your current weight is: Weight: 74.8 kg (165 lb) Your Body Mass Index (BMI) is:  BMI (Calculated): 25.84  Following the type of diet specifically designed for you will help prevent another stroke.  Your goal weight range is:    Your goal Body Mass Index (BMI) is 19-24.  Healthy food habits can help reduce 3 risk factors for stroke:  High cholesterol, hypertension, and excess weight.  RESOURCES Stroke/Support Group:  Call [phone]   STROKE EDUCATION PROVIDED/REVIEWED AND GIVEN TO PATIENT Stroke warning signs and symptoms How to activate emergency medical system (call 911). Medications prescribed at discharge. Need for follow-up after discharge. Personal risk factors for stroke. Pneumonia vaccine given:  Flu vaccine given:  My questions have been answered, the writing is legible, and I understand these instructions.  I will adhere to these goals & educational materials that have been provided to me after my discharge from the hospital.      My questions have been answered and I understand these instructions. I will adhere to these goals and the provided educational materials after my discharge from the hospital.  Patient/Caregiver Signature _______________________________ Date __________  Clinician Signature _______________________________________ Date __________  Please bring this form and your medication list with you to all your follow-up doctor's appointments.

## 2016-11-28 NOTE — Progress Notes (Signed)
Speech Language Pathology Discharge Summary  Patient Details  Name: Eric Morton MRN: [id number] Date of Birth: [birth year]  Today's Date: 11/28/2016 SLP Individual Time: 0800-0900 SLP Individual Time Calculation (min): 60 min   Skilled Therapeutic Interventions:  Skilled treatment session focused on cognition goals. SLP facilitated session by providing supervision cues for completion of checkers. Pt with increased cues needed to sustain attention to task. Education provided to pt on discharge planning.      Patient has met 3 of 3 long term goals.  Patient to discharge at overall Mod;Min level.   Clinical Impression/Discharge Summary:   Pt has made progress during skilled ST and has met all of his LTGs. Pt currently tolerates dysphagia 3 with functional abilities. His oral phase doesn't present with effective bolus manipulation but his has decreased aspiration risk. Pt continues to use his alphabet board effectively but his speech intelligibility remains at ~ 50% intelligible at the phrase level when supplemented by gestures.   Care Partner:  Caregiver Able to Provide Assistance: Yes  Type of Caregiver Assistance: Cognitive;Physical  Recommendation:  Home Health SLP;24 hour supervision/assistance  Rationale for SLP Follow Up: Maximize functional communication;Maximize cognitive function and independence;Reduce caregiver burden;Maximize swallowing safety   Equipment:     Reasons for discharge: Treatment goals met   Patient/Family Agrees with Progress Made and Goals Achieved: Yes   Function:    Cognition Comprehension Comprehension assist level: Follows basic conversation/direction with extra time/assistive device  Expression Expression assistive device: Communication board Expression assist level: Expresses basic 50 - 74% of the time/requires cueing 25 - 49% of the time. Needs to repeat parts of sentences.  Social Interaction Social Interaction assist level: Interacts  appropriately with others with medication or extra time (anti-anxiety, antidepressant).  Problem Solving Problem solving assist level: Solves basic 90% of the time/requires cueing < 10% of the time  Memory Memory assist level: Recognizes or recalls 90% of the time/requires cueing < 10% of the time   Julie-Ann Vanmaanen 11/28/2016, 10:55 AM

## 2016-11-28 NOTE — Progress Notes (Signed)
Occupational Therapy Discharge Summary  Patient Details  Name: EDRICK WHITEHORN MRN: [id number] Date of Birth: November 28, 1965   Patient has met 10 of 10 long term goals due to improved activity tolerance, improved balance, postural control, ability to compensate for deficits, functional use of  RIGHT upper extremity, improved attention, improved awareness and improved coordination.  Patient to discharge at [facility] Assist level.  Patient has made good progress while on IPR and has progressed to min A overall level. However, he has had very complicated d/c plan. Pt declined SNF placement in [place]. His sister has offered for him to stay with her in 2nd level apartment. This d/c option came about at last minute prior to d/c and therefore no family education has been completed and unsure of pt's sister's willing and ableness to provide the physical assist pt requires.   Due to not knowing pt's d/c disposition, shower/tub transfer was not addressed and thus no DME has been ordered for pt. Defer this to [facility] until they are able to see home set-up and decide on safest option for pt.   Reasons goals not met: n/a  Recommendation:  Patient will benefit from ongoing skilled OT services in home health setting to continue to advance functional skills in the area of BADL and Reduce care partner burden.  Equipment: Drop arm BSC, hemi walker, w/c  Reasons for discharge: treatment goals met and discharge from hospital  Patient/family agrees with progress made and goals achieved: Yes  OT Discharge Precautions/Restrictions  Precautions Precautions: Fall Restrictions Weight Bearing Restrictions: No Vision Baseline Vision/History: Glaucoma Patient Visual Report: No change from baseline Vision Assessment?: No apparent visual deficits Perception  Perception: Within Functional Limits Praxis Praxis: Intact Cognition Overall Cognitive Status: Impaired/Different from baseline Arousal/Alertness:  Awake/alert Orientation Level: Oriented X4 Attention: Selective Sustained Attention: Appears intact Sustained Attention Impairment: Functional complex;Verbal basic Memory: Appears intact Awareness: Appears intact Problem Solving: Impaired Problem Solving Impairment: Verbal basic;Functional complex Executive Function: Self Monitoring Self Monitoring: Impaired Safety/Judgment: Appears intact Sensation Sensation Light Touch: Impaired by gross assessment Light Touch Impaired Details: Impaired RUE Proprioception: Impaired by gross assessment Proprioception Impaired Details: Impaired RUE Coordination Fine Motor Movements are Fluid and Coordinated: No Coordination and Movement Description: R Hemiplegia UE>LE Finger Nose Finger Test: WFL L UE, decreased speed and accuracy due to weakness R UE Motor  Motor Motor: Hemiplegia Motor - Discharge Observations: R hemiplegia UE>LE Trunk/Postural Assessment  Cervical Assessment Cervical Assessment: Within Functional Limits Thoracic Assessment Thoracic Assessment: Exceptions to [facility] (Kyphotic) Lumbar Assessment Lumbar Assessment: Exceptions to [facility] (Posterior pelvic tilt) Postural Control Postural Control: Deficits on evaluation  Balance Balance Balance Assessed: Yes Static Sitting Balance Static Sitting - Balance Support: Feet supported Static Sitting - Level of Assistance: 6: Modified independent (Device/Increase time);5: Stand by assistance Dynamic Sitting Balance Dynamic Sitting - Balance Support: During functional activity;Feet supported Dynamic Sitting - Level of Assistance: 5: Stand by assistance Static Standing Balance Static Standing - Balance Support: During functional activity;No upper extremity supported Static Standing - Level of Assistance: 5: Stand by assistance;4: Min assist Dynamic Standing Balance Dynamic Standing - Balance Support: During functional activity Dynamic Standing - Level of Assistance: 4: Min  assist Dynamic Standing - Comments: Standing to complete LB bathing/dressing Extremity/Trunk Assessment RUE Assessment RUE Assessment: Exceptions to [facility] RUE Strength RUE Overall Strength: Deficits (1/5 shoulder flexion/extension; 2+/5 bicep; minimal gross grasp) LUE Assessment LUE Assessment: Within Functional Limits   See Function Navigator for Current Functional Status.  Lewis, Amy C 11/28/2016, 10:45  AM

## 2016-11-28 NOTE — Progress Notes (Signed)
Social Work Patient ID: HATEM CULL, male   DOB: 13-Jan-1966, 51 y.o.   MRN: [id number] Sister met with pt last night and discussed discharge plan. First was pt agreed to going to NH, then sister offered if pushed to take him to her home, but apartment up on second floor. Concern is being alone while sister works and up 16 steps if there is a Estate agent. Discussed alerting first responders of the situation so aware if needed. Pt this am is saying now he wants to go to a NH and if [place] can take him he will go. Spoke with sister also and she feels he needs more rehab and is working through some issues on her own. Have contacted management-Hope Rife-Director of the SW Dept to see if can pursue NH. She reports since pt has an option to go to sister's he needs to go there and will not offer LOG for a Nursing Facility. Have informed sister and pt of this and plan to go to sister's via ambulance tomorrow. Will order equipment and set up home health therapies. Plan for ambulance transport to sister's tomorrow.

## 2016-11-28 NOTE — Discharge Summary (Signed)
Discharge summary job # [id number]

## 2016-11-28 NOTE — Progress Notes (Signed)
Physical Therapy Discharge Summary  Patient Details  Name: Eric Morton MRN: [id number] Date of Birth: 31-Jan-1965  Today's Date: 11/28/2016 PT Individual Time: 1005-1030 AND 1300-1400 PT Individual Time Calculation (min): 25 min AND 60 min    Patient has met 7 of 7 long term goals due to improved activity tolerance, improved balance, improved postural control, increased strength, ability to compensate for deficits, functional use of  right upper extremity and right lower extremity and improved awareness.  Patient to discharge at a wheelchair level Supervision.   Patient's care partner is independent and requires assistance to provide the necessary physical and cognitive assistance at discharge.  Reasons goals not met: all treatment goals met.   Recommendation:  Patient will benefit from ongoing skilled PT services in home health setting to continue to advance safe functional mobility, address ongoing impairments in balance, strength, muscle timing, gait, endurance, safety, trasnfers, and minimize fall risk.  Equipment: WC, HW  Reasons for discharge: treatment goals met and discharge from hospital  Patient/family agrees with progress made and goals achieved: Yes   PT treatment:  Session 1:   Pt received sitting in WC and agreeable to PT  WC mobility through hall and through day room with distant supervision assist from PT.   Standing balance and forced use of the RLE to reach laterally and grab clothes pins following by cross body reach to place on basketball goal. Completed x 5. Min-mod assist from PT due to LOB to the R.   Patient returned to room and left sitting in [facility] with call bell in reach and all needs met.     Session 2.  Pt received sitting in WC and agreeable to PT. PT instructed pt in Grad day assessment to measure progress toward goals. See below for details. Patient returned to room and left sitting in [facility] with call bell in reach and all needs met.      PT  Discharge Precautions/Restrictions Precautions Precautions: Fall Restrictions Weight Bearing Restrictions: No Pain Pain Assessment Pain Assessment: No/denies pain Vision/Perception  Perception Perception: Within Functional Limits Praxis Praxis: Intact  Cognition Overall Cognitive Status: Impaired/Different from baseline Arousal/Alertness: Awake/alert Orientation Level: Oriented X4 Attention: Sustained Sustained Attention: Appears intact Sustained Attention Impairment: Functional complex;Verbal basic Selective Attention: Impaired Selective Attention Impairment: Functional basic Memory: Appears intact Awareness: Impaired Awareness Impairment: Anticipatory impairment Problem Solving: Impaired Problem Solving Impairment: Verbal complex;Functional complex Executive Function: Self Monitoring Self Monitoring: Appears intact Self Correcting: Appears intact Safety/Judgment: Appears intact Sensation Sensation Light Touch: Impaired by gross assessment Light Touch Impaired Details: Impaired RUE Proprioception: Impaired by gross assessment Proprioception Impaired Details: Impaired RUE Coordination Fine Motor Movements are Fluid and Coordinated: No Coordination and Movement Description: R Hemiplegia UE>LE Finger Nose Finger Test: WFL L UE, decreased speed and accuracy due to weakness R UE Motor  Motor Motor: Hemiplegia Motor - Discharge Observations: R hemiplegia UE>LE  Mobility Bed Mobility Bed Mobility: Supine to Sit;Rolling Right;Rolling Left;Sit to Supine Rolling Right: 5: Supervision Rolling Right Details: Verbal cues for precautions/safety Rolling Left: 5: Supervision Rolling Left Details: Verbal cues for precautions/safety Supine to Sit: 5: Supervision Supine to Sit Details: Verbal cues for precautions/safety Sit to Supine: 5: Supervision Sit to Supine - Details: Verbal cues for sequencing;Verbal cues for precautions/safety Transfers Transfers: Yes Sit to Stand: 4:  Min guard;5: Supervision Sit to Stand Details: Verbal cues for safe use of DME/AE;Verbal cues for precautions/safety Stand Pivot Transfers: 4: Min guard Stand Pivot Transfer Details: Verbal cues for precautions/safety;Verbal cues  for sequencing Squat Pivot Transfers: 5: Supervision Squat Pivot Transfer Details: Verbal cues for technique;Verbal cues for precautions/safety  Car transfer: min assist from PT with cues for technique.  Locomotion  Ambulation Ambulation: Yes Ambulation/Gait Assistance: 4: Min assist;3: Mod assist Ambulation Distance (Feet): 70 Feet Assistive device: Hemi-walker Gait Gait: Yes Gait Pattern: Step-to pattern;Lateral hip instability;Trunk flexed Stairs / Additional Locomotion Stairs: Yes Stairs Assistance: 3: Mod assist Stairs Assistance Details: Verbal cues for technique;Verbal cues for precautions/safety;Verbal cues for gait pattern;Verbal cues for safe use of DME/AE Stair Management Technique: One rail Left Number of Stairs: 8 Height of Stairs: 3 Wheelchair Mobility Wheelchair Mobility: Yes Wheelchair Assistance: 5: Supervision Wheelchair Assistance Details: Verbal cues for gait pattern;Verbal cues for safe use of DME/AE Wheelchair Propulsion: Left upper extremity;Left lower extremity Distance: 238f   Trunk/Postural Assessment  Cervical Assessment Cervical Assessment: Within Functional Limits Thoracic Assessment Thoracic Assessment: Exceptions to [facility](Kyphotic) Lumbar Assessment Lumbar Assessment: Exceptions to W[facility](Posterior pelvic tilt) Postural Control Postural Control: Deficits on evaluation  Balance Balance Balance Assessed: Yes Static Sitting Balance Static Sitting - Balance Support: Feet supported Static Sitting - Level of Assistance: 6: Modified independent (Device/Increase time);5: Stand by assistance Dynamic Sitting Balance Dynamic Sitting - Balance Support: During functional activity;Feet supported Dynamic Sitting - Level of  Assistance: 5: Stand by assistance Static Standing Balance Static Standing - Balance Support: During functional activity;No upper extremity supported Static Standing - Level of Assistance: 5: Stand by assistance;4: Min assist Dynamic Standing Balance Dynamic Standing - Balance Support: During functional activity Dynamic Standing - Level of Assistance: 4: Min assist Dynamic Standing - Comments: Standing to complete LB bathing/dressing Extremity Assessment  RUE Assessment RUE Assessment: Exceptions to WProfessional Hosp Inc - ManatiRUE Strength RUE Overall Strength: Deficits (1/5 shoulder flexion/extension; 2+/5 bicep; minimal gross grasp) LUE Assessment LUE Assessment: Within Functional Limits RLE Assessment RLE Assessment: Exceptions to WFL (ROM WFL) RLE Strength Right Hip Flexion: 4-/5 Right Hip Extension: 3+/5 Right Knee Flexion: 4-/5 Right Knee Extension: 4-/5 Right Ankle Dorsiflexion: 4-/5 LLE Assessment LLE Assessment: Within Functional Limits   See Function Navigator for Current Functional Status.  [name]11/02/2016, 1:47 PM

## 2016-11-29 LAB — GLUCOSE, CAPILLARY
GLUCOSE-CAPILLARY: 216 mg/dL — AB (ref 65–99)
GLUCOSE-CAPILLARY: 248 mg/dL — AB (ref 65–99)
Glucose-Capillary: 116 mg/dL — ABNORMAL HIGH (ref 65–99)

## 2016-11-29 NOTE — Plan of Care (Signed)
Problem: RH SAFETY Goal: RH STG ADHERE TO SAFETY PRECAUTIONS W/ASSISTANCE/DEVICE STG Adhere to Safety Precautions With mod Assistance and appropriate assistive Device.   Outcome: Not Progressing Unassisted fall r/t patient transferring independently without calling for help.

## 2016-11-29 NOTE — Progress Notes (Signed)
Patient discharged at 1659 to sister's apartment via ambulance. Patient's sister given all prescriptions and discharge instructions earlier this afternoon. All questions answered. Belongings sent home with the sister.

## 2016-11-29 NOTE — Progress Notes (Signed)
Eric Morton is a 51 y.o. male [birth year] [id number]  Subjective: No new complaints. Fall as noted = no injury.   Objective: Vital signs in last 24 hours: Temp:  [98.2 F (36.8 C)] 98.2 F (36.8 C) (11/03 0449) Pulse Rate:  [75-80] 75 (11/03 0449) Resp:  [18] 18 (11/03 0449) BP: (132-150)/(76-78) 150/78 (11/03 0449) SpO2:  [100 %] 100 % (11/03 0449) Weight change:  Last BM Date: 11/28/16  Intake/Output from previous day: 11/02 0701 - 11/03 0700 In: 360 [P.O.:360] Out: 370 [Urine:370]  Physical Exam General: No apparent distress   Baseline aphasia and R HP Lungs: Normal effort. Lungs clear to auscultation, no crackles or wheezes. Cardiovascular: Regular rate and rhythm, no edema  Lab Results: BMET    Component Value Date/Time   NA 141 11/13/2016 0548   NA 136 06/06/2016 0944   K 4.0 11/13/2016 0548   CL 111 11/13/2016 0548   CO2 23 11/13/2016 0548   GLUCOSE 148 (H) 11/13/2016 0548   BUN 10 11/13/2016 0548   BUN 8 06/06/2016 0944   CREATININE 0.98 11/24/2016 0512   CREATININE 0.86 05/04/2014 1208   CALCIUM 9.1 11/13/2016 0548   GFRNONAA >60 11/24/2016 0512   GFRAA >60 11/24/2016 0512   CBC    Component Value Date/Time   WBC 5.4 10/28/2016 0431   RBC 4.53 10/28/2016 0431   HGB 12.8 (L) 10/28/2016 0431   HGB 15.0 09/23/2016 1251   HCT 39.7 10/28/2016 0431   HCT 45.4 09/23/2016 1251   PLT 137 (L) 10/28/2016 0431   PLT 131 (L) 09/23/2016 1251   MCV 87.6 10/28/2016 0431   MCV 88.2 09/23/2016 1251   MCH 28.3 10/28/2016 0431   MCHC 32.2 10/28/2016 0431   RDW 13.3 10/28/2016 0431   RDW 13.8 09/23/2016 1251   LYMPHSABS 2.3 10/28/2016 0431   LYMPHSABS 1.8 09/23/2016 1251   MONOABS 0.6 10/28/2016 0431   MONOABS 0.4 09/23/2016 1251   EOSABS 0.4 10/28/2016 0431   EOSABS 0.4 09/23/2016 1251   BASOSABS 0.0 10/28/2016 0431   BASOSABS 0.0 09/23/2016 1251   CBG's (last 3):   Recent Labs  11/28/16 1625 11/28/16 2241 11/29/16 0644  GLUCAP 171* 246* 116*    LFT's Lab Results  Component Value Date   ALT 26 10/28/2016   AST 47 (H) 10/28/2016   ALKPHOS 68 10/28/2016   BILITOT 0.8 10/28/2016    Studies/Results: No results found.  Medications:  I have reviewed the patient's current medications. Scheduled Medications: . amLODipine  5 mg Oral Daily  . clopidogrel  75 mg Oral Daily  . enoxaparin (LOVENOX) injection  40 mg Subcutaneous Q24H  . folic acid  1 mg Oral Daily  . gabapentin  300 mg Oral QHS  . glipiZIDE  10 mg Oral QAC breakfast  . insulin aspart  0-9 Units Subcutaneous TID WC & HS  . lisinopril  20 mg Oral Daily  . multivitamin with minerals  1 tablet Oral Daily  . polyvinyl alcohol  2 drop Both Eyes BID  . rosuvastatin  40 mg Oral q1800  . sertraline  25 mg Oral Daily  . 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 Asa Lente, MD 11/29/2016, 11:09 AM

## 2016-11-29 NOTE — Significant Event (Signed)
Patient found on floor in patient room by CNA at 2127.  Press photographer for assistance.  Writer observed patient sitting upright resting with back against bed in no apparent distress.  Patient refused vital signs and CBG. Patient denies hitting his head and denies pain.  No changes in ROM, LOC, or cognition.  Skin assessment shows no changes.  Assisted to wheelchair by two staff.  Patient expresses he was trying to get into his bed alone when he fell.  Writer educated patient on the importance of calling for assistance for transfers.  Patient expressed understanding.   MDOC, [place], notified of fall and patient refusal to obtain vital signs/CBG at 2200.  Patient refused family notification.

## 2016-12-01 ENCOUNTER — Telehealth: Payer: Self-pay | Admitting: Family Medicine

## 2016-12-01 NOTE — Telephone Encounter (Signed)
Ok to give verbal orders?

## 2016-12-01 NOTE — Discharge Summary (Signed)
Eric Morton, Eric Morton NO.:  [id number]  MEDICAL RECORD NO.:  [id number]  LOCATION:                                 FACILITY:  PHYSICIAN:  Eric Morton, M.D.   DATE OF BIRTH:  DATE OF ADMISSION:  10/27/2016 DATE OF DISCHARGE:  11/29/2016                              DISCHARGE SUMMARY   DISCHARGE DIAGNOSES: 1. Embolic cerebral infarction as well as history of cerebrovascular     accident in 2017. 2. Subcutaneous Lovenox for DVT prophylaxis. 3. Pain management. 4. Dysphagia. 5. Hypertension. 6. Diabetes mellitus. 7. Peripheral neuropathy. 8. Polysubstance abuse. 9. History of polycythemia.  HISTORY OF PRESENT ILLNESS:  This is a 51 year old right-handed male with history of polysubstance abuse, hypertension, polycythemia, diabetes mellitus, who lives alone in an apartment connected to his aunt's home, independent prior to admission.  Presented on October 23, 2016, with right-sided weakness and dysarthria.  Urine drug screen positive for cocaine.  Elevated blood pressure in the 200s.  CT cerebral perfusion scan did not detect infarction.  CT angiogram of head and neck negative from emergent large vessel occlusion.  No significant carotid or right vertebral artery stenosis.  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  MRI showed multiple CVAs, numerous infarcts including a large 18 x 17 left basal ganglia infarction suggestive to be embolic.  Echocardiogram with ejection fraction of 03%, grade 1 diastolic dysfunction.  Bilateral lower extremity Doppler was negative.  He was on a dysphagia #1 honey thick liquid diet.  Subcutaneous Lovenox for DVT prophylaxis.  The patient was admitted for comprehensive rehab program.  PAST MEDICAL HISTORY:  See discharge diagnoses.  SOCIAL HISTORY:  He lives alone.  He has a cousin with good support.  FUNCTIONAL STATUS UPON ADMISSION TO REHAB SERVICES:  Severe dysarthria, right  small facial weakness.  PHYSICAL EXAMINATION:  HEENT:  EOMs intact. NECK:  Supple.  Nontender.  No JVD. CARDIAC:  Rate controlled. ABDOMEN:  Soft, nontender.  Good bowel sounds. LUNGS:  Clear to auscultation.  He had right upper extremity weakness.  REHABILITATION HOSPITAL COURSE:  The patient was admitted to Inpatient Rehab Services.  Therapies initiated on a 3-hour daily basis, consisting of physical therapy, occupational therapy, speech therapy, and rehabilitation nursing.  The following issues were addressed during the patient's rehab stay.  Pertaining to Eric Morton' embolic cerebral infarction, remained stable.  Thirty-day event monitor had already been ordered by Neurology Services.  He remained on aspirin and Plavix therapy.  Subcutaneous Lovenox for DVT prophylaxis.  Venous Doppler studies negative.  His diet was advanced to mechanical soft, thin liquids.  Blood pressures controlled with lisinopril as well as Norvasc. Diabetes mellitus, peripheral neuropathy, hemoglobin A1c of 8.9.  He continued on Glucotrol.  He had been on Glucophage prior to admission, however, it was held due to some low blood sugar readings.  Full diabetic teaching completed.  Polysubstance abuse, urine drug screen positive for cocaine.  Received full counsel in regard to cessation of illicit drug products.  The patient received weekly collaborative interdisciplinary team conferences to discuss estimated length of stay, family teaching, any barriers to  his discharge.  Ambulates 30 feet, moderate assist, hemi walker.  He would engage in using his wheelchair for propulsion.  He completed squat pivot, stand pivot transfers throughout sessions with close supervision, contact guard, bathe, seated in a 3 x 1 bedside commode assistance to obtain figure-4 positions.  He was able to communicate his full needs using a communication board and followed by Speech Therapy.  There was some initial speculation the patient  would need skilled nursing facility.  He was adamant against skilled nursing facility.  All issues discussed with family.  Case management arranged for discharge plan to home on November 29, 2016.  DISCHARGE MEDICATIONS: 1. Aspirin 81 mg p.o. daily. 2. Norvasc 5 mg p.o. daily. 3. Plavix 75 mg p.o. daily. 4. Folic acid 1 mg p.o. daily. 5. Neurontin 300 mg p.o. at bedtime. 6. Glipizide 10 mg p.o. daily. 7. Lisinopril 20 mg p.o. daily. 8. Multivitamin daily. 9. Crestor 40 mg p.o. daily. 10.Zoloft 25 mg p.o. daily.  DIET:  His diet was mechanical soft.  FOLLOWUP:  He would follow up with Eric Morton of Neurology Services, call for appointment; Eric Morton at the Outpatient Rehab Service office as advised; Eric Morton, Medical Management.  SPECIAL INSTRUCTIONS:  No driving.  No smoking.  No alcohol or illicit drug products.     Eric Morton, P.A.   ______________________________ Eric Morton, M.D.    DA/MEDQ  D:  11/28/2016  T:  11/29/2016  Job:  [id number]  cc:   Dr. Harold Hedge, MD Eric Morton, M.D.

## 2016-12-01 NOTE — Telephone Encounter (Signed)
[facility] physical therapy call to request order for Physical therapy for 9 weeks Home health Aids Social Worker,  Please call her back Eric Morton) to give the verbal order, please follow up

## 2016-12-02 ENCOUNTER — Telehealth: Payer: Self-pay

## 2016-12-02 ENCOUNTER — Telehealth: Payer: Self-pay | Admitting: Licensed Clinical Social Worker

## 2016-12-02 NOTE — Telephone Encounter (Signed)
As per Christa See, LCSW, the patient is living on the second floor apartment with his sister. He is not able to navigate stairs and is therefore not able to get to his upcoming medical appointments. They are not able to afford the cost of specialized  Transportation.  Call placed to [facility] to inquire about the patient's mobility at home.  Spoke to [place] who transferred the call to the PT who has been the patient.  As per Merry Proud, PT has seen the patient 11/30/16 and today.  Voicemail message left for PT requesting a call back to # [phone].

## 2016-12-02 NOTE — Telephone Encounter (Signed)
[place] for SLP 2x per week x 1 and 1 x per week x 1

## 2016-12-02 NOTE — Telephone Encounter (Signed)
Eric Morton speech pathologist with [facility] is requesting verbal orders for 2 times a wk for 2 wks, and 1 time a wk for 2 wks.

## 2016-12-02 NOTE — Telephone Encounter (Signed)
LCSWA received an incoming call from pt's sister, [name]. Pt has upcoming medical appointments; however, does not have any assistance transferring from sister's second story apartment to vehicle.  [name] shared that she recently lost her job due to caring for pt. LCSWA provided information on [place] in the case pt is approved for Medicaid. Family was also strongly encouraged to attend support group for stroke survivors and family members offered through [facility] to strengthen support system.     LCSWA consulted with RN CM, Eden Lathe, about pt's transportation barriers. Pt's physical therapist will be contacted to address mobility concerns and inquire about appropriate resources.

## 2016-12-03 NOTE — Telephone Encounter (Signed)
RMA Alycia gave verbal orders for home health to start.

## 2016-12-03 NOTE — Telephone Encounter (Signed)
Called allison back and approved her verbal orders

## 2016-12-04 ENCOUNTER — Telehealth: Payer: Self-pay | Admitting: *Deleted

## 2016-12-04 NOTE — Telephone Encounter (Signed)
Alonna Minium, ST, [facility] [place] contacted [place] asking for verbal orders for social work evaluation to help patient and his sister connect to community resources as their resources are limited.  Contacted Allison Mallory back and gave verbal orders per office protocol

## 2016-12-05 ENCOUNTER — Telehealth: Payer: Self-pay | Admitting: Family Medicine

## 2016-12-05 MED ORDER — GLUCOSE BLOOD VI STRP
ORAL_STRIP | 12 refills | Status: DC
Start: 2016-12-05 — End: 2017-07-14

## 2016-12-05 MED ORDER — RELION CONFIRM GLUCOSE MONITOR W/DEVICE KIT
PACK | 0 refills | Status: DC
Start: 2016-12-05 — End: 2017-07-14

## 2016-12-05 NOTE — Telephone Encounter (Signed)
Patient's preferred pharmacy is [facility] test supplies sent to pharmacy. HIPAA compliant voicemail left on machine to let them know supplies were ordered and sent to [facility].

## 2016-12-05 NOTE — Telephone Encounter (Signed)
Pt's sister came into office requesting a glucometer for pt. Pt has not been able to check blood sugars for 32 days due to not having one. Please f/up

## 2016-12-08 ENCOUNTER — Telehealth: Payer: Self-pay | Admitting: Family Medicine

## 2016-12-08 NOTE — Telephone Encounter (Signed)
I have not seen him in 7 months; he would need to come in for an appointment.  If he has acute pain and swelling he will have to go to the emergency room to be evaluated for DVT.

## 2016-12-08 NOTE — Telephone Encounter (Signed)
Ebony Hail from [facility] called to let pt. PCP know that pt. Right leg and foot are swollen. She thinks that the Amlodipine may be the cause of his swelling. Pt. Sister had the same symptoms when she was taking the medication. Pt. Is unable to leave his room since he lives on the second floor. It is hard for the pt. To come to an appt. Due to his swelling and not being able to walk. Please f/u

## 2016-12-08 NOTE — Telephone Encounter (Signed)
Will route to PCP 

## 2016-12-09 ENCOUNTER — Telehealth: Payer: Self-pay

## 2016-12-09 ENCOUNTER — Emergency Department (HOSPITAL_BASED_OUTPATIENT_CLINIC_OR_DEPARTMENT_OTHER)
Admit: 2016-12-09 | Discharge: 2016-12-09 | Disposition: A | Discharge status: Home / Self Care | Payer: Medicaid Other | Attending: Student | Admitting: Student

## 2016-12-09 ENCOUNTER — Encounter (HOSPITAL_COMMUNITY): Payer: Self-pay | Admitting: Internal Medicine

## 2016-12-09 ENCOUNTER — Emergency Department (HOSPITAL_COMMUNITY)
Admission: EM | Admit: 2016-12-09 | Discharge: 2016-12-09 | Disposition: A | Discharge status: Home / Self Care | Payer: Medicaid Other | Attending: Emergency Medicine | Admitting: Emergency Medicine

## 2016-12-09 DIAGNOSIS — Z8673 Personal history of transient ischemic attack (TIA), and cerebral infarction without residual deficits: Secondary | ICD-10-CM | POA: Diagnosis not present

## 2016-12-09 DIAGNOSIS — Z79899 Other long term (current) drug therapy: Secondary | ICD-10-CM | POA: Diagnosis not present

## 2016-12-09 DIAGNOSIS — M7989 Other specified soft tissue disorders: Secondary | ICD-10-CM

## 2016-12-09 DIAGNOSIS — Z7902 Long term (current) use of antithrombotics/antiplatelets: Secondary | ICD-10-CM | POA: Diagnosis not present

## 2016-12-09 DIAGNOSIS — R600 Localized edema: Secondary | ICD-10-CM | POA: Diagnosis present

## 2016-12-09 DIAGNOSIS — E1140 Type 2 diabetes mellitus with diabetic neuropathy, unspecified: Secondary | ICD-10-CM | POA: Insufficient documentation

## 2016-12-09 DIAGNOSIS — N189 Chronic kidney disease, unspecified: Secondary | ICD-10-CM | POA: Diagnosis not present

## 2016-12-09 DIAGNOSIS — F17210 Nicotine dependence, cigarettes, uncomplicated: Secondary | ICD-10-CM | POA: Insufficient documentation

## 2016-12-09 DIAGNOSIS — I129 Hypertensive chronic kidney disease with stage 1 through stage 4 chronic kidney disease, or unspecified chronic kidney disease: Secondary | ICD-10-CM | POA: Insufficient documentation

## 2016-12-09 DIAGNOSIS — M79609 Pain in unspecified limb: Secondary | ICD-10-CM

## 2016-12-09 DIAGNOSIS — Z7982 Long term (current) use of aspirin: Secondary | ICD-10-CM | POA: Insufficient documentation

## 2016-12-09 DIAGNOSIS — E1122 Type 2 diabetes mellitus with diabetic chronic kidney disease: Secondary | ICD-10-CM | POA: Diagnosis not present

## 2016-12-09 LAB — BASIC METABOLIC PANEL
ANION GAP: 5 (ref 5–15)
BUN: 13 mg/dL (ref 6–20)
CHLORIDE: 108 mmol/L (ref 101–111)
CO2: 27 mmol/L (ref 22–32)
Calcium: 9.2 mg/dL (ref 8.9–10.3)
Creatinine, Ser: 1.08 mg/dL (ref 0.61–1.24)
Glucose, Bld: 204 mg/dL — ABNORMAL HIGH (ref 65–99)
POTASSIUM: 3.6 mmol/L (ref 3.5–5.1)
SODIUM: 140 mmol/L (ref 135–145)

## 2016-12-09 LAB — CBC WITH DIFFERENTIAL/PLATELET
BASOS ABS: 0 10*3/uL (ref 0.0–0.1)
Basophils Relative: 0 %
EOS PCT: 7 %
Eosinophils Absolute: 0.4 10*3/uL (ref 0.0–0.7)
HCT: 37.9 % — ABNORMAL LOW (ref 39.0–52.0)
Hemoglobin: 12.3 g/dL — ABNORMAL LOW (ref 13.0–17.0)
LYMPHS PCT: 38 %
Lymphs Abs: 2.1 10*3/uL (ref 0.7–4.0)
MCH: 28 pg (ref 26.0–34.0)
MCHC: 32.5 g/dL (ref 30.0–36.0)
MCV: 86.3 fL (ref 78.0–100.0)
MONO ABS: 0.4 10*3/uL (ref 0.1–1.0)
Monocytes Relative: 8 %
Neutro Abs: 2.6 10*3/uL (ref 1.7–7.7)
Neutrophils Relative %: 47 %
PLATELETS: 179 10*3/uL (ref 150–400)
RBC: 4.39 MIL/uL (ref 4.22–5.81)
RDW: 13 % (ref 11.5–15.5)
WBC: 5.5 10*3/uL (ref 4.0–10.5)

## 2016-12-09 NOTE — ED Notes (Signed)
Patient denies pain and is resting comfortably.  

## 2016-12-09 NOTE — Telephone Encounter (Signed)
Call placed to [facility] requesting to speak to the patient's PT to inquire about his status at home. Spoke to [place] who stated that he would leave a message for [place], PT to return the call this CM

## 2016-12-09 NOTE — ED Triage Notes (Addendum)
Pt arrived to [facility] via [facility] with right leg swelling. Pt's sister called EMS as recommended by pt's PCP to get checked out for a blood clot in the right leg. Swelling started 6 days ago.   Pt has right sided weakness and slurred speech at baseline. Pt communicates well with cards. Had a stroke in September.

## 2016-12-09 NOTE — Telephone Encounter (Signed)
Call received from Eric Morton, [facility] scheduler.  She noted that the patient's sister was calling  back after receiving the message about taking her brother to the ED.  She was inquiring about transportation back home after ED evaluation. Explained to Eric Morton  that it is very important that the patient be assessed in the ED as instructed and the ED will assist with arranging transportation back home. As per Eric Morton his sister was going to call 911 as she was not able to take him to the hospital.

## 2016-12-09 NOTE — Telephone Encounter (Signed)
Pt sister was called and a VM was left informing her to take pt to the ed to rule out DVT.

## 2016-12-09 NOTE — ED Notes (Signed)
ED Provider at bedside. 

## 2016-12-09 NOTE — ED Provider Notes (Signed)
[facility] DEPT Provider Note   CSN: [id number] Arrival date & time: 12/09/16  1209     History   Chief Complaint Chief Complaint  Patient presents with  . Leg Swelling    right leg    HPI Eric Morton is a 51 y.o. male.  HPI 51 year old African-American male past medical history significant for diabetes, hypertension, recent CVA with right-sided deficit the presents to the emergency department today with complaints of right leg swelling that started 6 days ago.  Patient is coming from home where he lives with his sister.  Patient is not ambulatory at baseline due to the stroke.  It was recommended that patient's sister called EMS to have patient transported to the ED for evaluation by PCP to be assessed for blood clots in his right leg.  Patient denies any associated pain.  Denies any associated chest pain or shortness of breath.  Patient has not chronic blood thinners.  Denies any history of DVT/PE.  Patient has point mobility of the right leg without any pain.  Denies any associated erythema.  Denies any associated fever, chills, nausea, vomiting, or paresthesias. Patient has not tried anything for his symptoms prior to arrival.  Nothing makes better or worse. Past Medical History:  Diagnosis Date  . 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 Anxiety   . Chronic kidney disease    "jsut a little damage"  . Glaucoma   . History of stomach ulcers    not confirmed, never underwent diagnostic endo or radiology, this manifested as epigastric distress.   [name] Hx of adenomatous colonic polyps   . Hypercholesterolemia   . Hypertension   . Peripheral neuropathy   . Polycythemia vera ([place]) 2006  . Substance abuse ([place])   . Type II diabetes mellitus [facility])     Patient Active Problem List   Diagnosis Date Noted  . Right hemiparesis ([place]) 11/25/2016  . Acute blood loss anemia   . Benign essential HTN   . Dysphagia, post-stroke   .  Hemiparesis affecting right side as late effect of stroke ([place]) 10/29/2016  . 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 Alcohol use   . Cocaine use   . Dysarthria due to recent stroke   . Dysphagia due to recent stroke   . Left-sided weakness 10/23/2016  . Cerebral thrombosis with cerebral infarction 10/23/2016  . Aphasia   . Hyperlipidemia 07/27/2015  . Cerebrovascular accident (CVA) due to occlusion of cerebral artery ([place])   . Stroke ([place]) 03/21/2015  . Polycythemia vera ([place]) 03/21/2015  . Hx of adenomatous colonic polyps   . Dehydration   . Diarrhea   . Protein-calorie malnutrition ([place])   . Cellulitis of breast of male 09/25/2011  . HTN (hypertension) 09/25/2011  . DM (diabetes mellitus) ([place]) 09/25/2011  . Leukopenia 09/25/2011  . Alcohol abuse 09/25/2011  . Cocaine abuse ([place]) 09/25/2011  . Tobacco use 09/25/2011  . Neuropathy 09/25/2011    History reviewed. No pertinent surgical history.     Home Medications    Prior to Admission medications   Medication Sig Start Date End Date Taking? Authorizing Provider  clopidogrel (PLAVIX) 75 MG tablet Take 1 tablet (75 mg total) by mouth daily. 11/28/16  Yes Angiulli, Lavon Paganini, PA-C  gabapentin (NEURONTIN) 300 MG capsule Take 1 capsule (300 mg total) by mouth at bedtime. 11/28/16  Yes Angiulli, Lavon Paganini, PA-C  lisinopril (PRINIVIL,ZESTRIL) 20 MG tablet Take 1 tablet (20  mg total) by mouth daily. 11/29/16  Yes Angiulli, Lavon Paganini, PA-C  Multiple Vitamin (MULTIVITAMIN WITH MINERALS) TABS tablet Take 1 tablet by mouth daily. 11/29/16  Yes Angiulli, Lavon Paganini, PA-C  rosuvastatin (CRESTOR) 40 MG tablet Take 1 tablet (40 mg total) by mouth daily at 6 PM. 11/28/16  Yes Angiulli, Lavon Paganini, PA-C  amLODipine (NORVASC) 5 MG tablet Take 1 tablet (5 mg total) by mouth daily. 11/29/16   Angiulli, Lavon Paganini, PA-C  aspirin (ASPIRIN CHILDRENS) 81 MG chewable tablet Chew 1 tablet (81 mg total) by mouth daily. 11/28/16 11/28/17  Angiulli, Lavon Paganini,  PA-C  Blood Glucose Monitoring Suppl (RELION CONFIRM GLUCOSE MONITOR) w/Device KIT Use as directed 12/05/16   Arnoldo Morale, MD  folic acid (FOLVITE) 1 MG tablet Take 1 tablet (1 mg total) by mouth daily. 11/28/16   Angiulli, Lavon Paganini, PA-C  glipiZIDE (GLUCOTROL) 10 MG tablet Take 1 tablet (10 mg total) by mouth daily before breakfast. 11/29/16   Angiulli, Lavon Paganini, PA-C  glucose blood (RELION CONFIRM/MICRO TEST) test strip Use as instructed 12/05/16   Arnoldo Morale, MD  lisinopril (PRINIVIL,ZESTRIL) 10 MG tablet Take 10 mg daily by mouth. 11/07/16   [provider]  pantoprazole (PROTONIX) 40 MG tablet Take 1 tablet (40 mg total) by mouth daily at 6 (six) AM. 11/28/16   Angiulli, Lavon Paganini, PA-C  sertraline (ZOLOFT) 25 MG tablet Take 1 tablet (25 mg total) by mouth daily. 11/29/16   Angiulli, Lavon Paganini, PA-C    Family History Family History  Problem Relation Age of Onset  . Diabetes Mother   . Hypertension Mother     Social History Social History   Tobacco Use  . Smoking status: Current Some Day Smoker    Packs/day: 0.25    Years: 32.00    Pack years: 8.00    Types: Cigarettes  . Smokeless tobacco: Never Used  Substance Use Topics  . Alcohol use: Yes    Comment: 3/14 quit drinking 5 days ago and then drank last nite  . Drug use: Yes    Types: Marijuana, "Crack" cocaine    Comment: snorts and smokes cocaine ocassionally      Allergies   Lipitor [atorvastatin]   Review of Systems Review of Systems  Constitutional: Negative for chills and fever.  Respiratory: Negative for cough and shortness of breath.   Cardiovascular: Positive for leg swelling (rigth). Negative for chest pain and palpitations.  Gastrointestinal: Negative for abdominal pain, diarrhea, nausea and vomiting.  Genitourinary: Negative for dysuria, flank pain, frequency and hematuria.  Musculoskeletal: Negative for arthralgias and myalgias.  Skin: Negative for rash.  Neurological: Negative for dizziness,  syncope, weakness, light-headedness, numbness and headaches.  Psychiatric/Behavioral: Negative for sleep disturbance. The patient is not nervous/anxious.      Physical Exam Updated Vital Signs BP (!) 138/110   Pulse 67   Temp 98.2 F (36.8 C) (Oral)   Resp 15   SpO2 100%   Physical Exam  Constitutional: He appears well-developed and well-nourished. No distress.  HENT:  Head: Normocephalic and atraumatic.  Eyes: Right eye exhibits no discharge. Left eye exhibits no discharge. No scleral icterus.  Neck: Normal range of motion. Neck supple.  Cardiovascular: Normal rate, regular rhythm, normal heart sounds and intact distal pulses.  Pulmonary/Chest: Effort normal and breath sounds normal. No stridor. No respiratory distress. He has no wheezes. He has no rales. He exhibits no tenderness.  Abdominal: Soft. Bowel sounds are normal. There is no tenderness. There is no  rebound and no guarding.  Musculoskeletal: Normal range of motion.  Patient with mild right lower extremity edema.  No associated pain to palpation.  DP pulses 2+ bilaterally.  Sensation intact.  Cap refill normal.  Neurological: He is alert.  Patient has right-sided weakness at baseline from prior stroke.  However strength is 3 out of 5 in right upper and lower extremities.  Patient speech is slurred at baseline from prior stroke however he is able to communicate with letter cards.  Skin: Skin is warm and dry. Capillary refill takes less than 2 seconds. No pallor.  Psychiatric: His behavior is normal. Judgment and thought content normal.  Nursing note and vitals reviewed.    ED Treatments / Results  Labs (all labs ordered are listed, but only abnormal results are displayed) Labs Reviewed  BASIC METABOLIC PANEL - Abnormal; Notable for the following components:      Result Value   Glucose, Bld 204 (*)    All other components within normal limits  CBC WITH DIFFERENTIAL/PLATELET - Abnormal; Notable for the following  components:   Hemoglobin 12.3 (*)    HCT 37.9 (*)    All other components within normal limits    EKG  EKG Interpretation None       Radiology No results found.  Procedures Procedures (including critical care time)  Medications Ordered in ED Medications - No data to display   Initial Impression / Assessment and Plan / ED Course  I have reviewed the triage vital signs and the nursing notes.  Pertinent labs & imaging results that were available during my care of the patient were reviewed by me and considered in my medical decision making (see chart for details).     Patient presents to the ED for evaluation of right extremity swelling.  Patient with recent CVA approximately 2 months ago was hospitalized.  Patient denies any associated pain.  Denies any associated chest pain or shortness of breath.  Patient vital signs reassuring.  He is afebrile, no hypoxia, no tachypnea noted.  On exam patient is neurovascularly intact.  Does have some right-sided deficit at baseline but no new weakness apparent.  Patient has slurred speech but uses were cars to communicate at baseline.  Patient has full range of motion of all joints without any erythema or warmth that be concerning for septic arthritis.  Lungs clear to auscultation bilaterally.  Abdominal exam is benign.  Regular rate and rhythm without any murmurs noted.  Basic labs were obtained that were at patient's baseline and reassuring.  DVT study was ordered that showed no acute DVT.  Patient denies any associated chest pain or shortness of breath would be concerning for PE.  Not feel that imaging is indicated at this time.  Patient states that he is on Norvasc.  This could be a cause of patient's right lower extremity edema.  Does not have any bilateral lower extremity edema.  Does not appear to be fluid overload that be concerning for CHF.  Lungs clear to auscultation bilaterally without any crackles.  Encourage patient to use  compression hoses.  Encouraged to follow-up with PCP and may be have his blood pressure medicine change that this could be causing his peripheral edema.  Pt is hemodynamically stable, in NAD, & able to ambulate in the ED. Evaluation does not show pathology that would require ongoing emergent intervention or inpatient treatment. I explained the diagnosis to the patient. Pain has been managed & has no complaints prior to dc. Pt  is comfortable with above plan and is stable for discharge at this time. All questions were answered prior to disposition. Strict return precautions for f/u to the ED were discussed. Encouraged follow up with PCP.   Final Clinical Impressions(s) / ED Diagnoses   Final diagnoses:  Leg swelling    ED Discharge Orders    None       Aaron Edelman 12/09/16 1714    Tegeler, Gwenyth Allegra, MD 12/09/16 2036

## 2016-12-09 NOTE — Discharge Instructions (Signed)
Your ultrasound did not show signs of blood clot today.  This may be due to your blood pressure medicine Norvasc.  Please discuss with your primary care doctor.  You may also wear compression hoses.  If you develop worsening swelling or develop any chest pain or shortness of breath return to the ED for evaluation.

## 2016-12-09 NOTE — Progress Notes (Signed)
Right lower extremity venous duplex completed. No evidence of DVT, superficial thrombosis, or Baker's cyst.  Toma Copier, RVS  12/09/2016 4:00 pm

## 2016-12-09 NOTE — Telephone Encounter (Signed)
Pt recently had a stroke and due to swelling of legs pt is not able to leave his apartment. Ebony Hail states that they will monitor his swelling in his legs.

## 2016-12-09 NOTE — ED Notes (Signed)
Ultrasound at bedside

## 2016-12-10 ENCOUNTER — Inpatient Hospital Stay: Payer: No Typology Code available for payment source

## 2016-12-10 ENCOUNTER — Telehealth: Payer: Self-pay

## 2016-12-10 NOTE — Telephone Encounter (Signed)
Call received from [place], [place] # [phone].  She reported that the patient is making progress with his therapy.  He however is not able to safely negotiate the stairs.  The handrail is on the right side when descending the stairs and the patient has right side paralysis.  He is currently in a second floor apartment living with his sister. He has been instructed to descend the stairs on his bottom in an emergency and if help is not available. He communicates with paper cards to spell out words. His sister, [name] "Net" is the primary caregiver/contact person. His sister provides 24/7 care for him as he needs assist with ADLs/IADLs.  Ellison Hughs also explained that there is concern that his sister lost her job to provide care for him and now she is not able to pay the rent.    Informed her that this CM would contact [name] about the [employer] [facility] [place] resource available at [facility].   Call placed to the patient's sister, [name]. She explained how she lost a new job in order to provide care for her brother. He needs 24/7 assistance/supervision. She explained that she took him in when he was discharged from the hospital/rehab because otherwise he would be homeless. She then explained that now she is not able to pay her rent and her car was repossessed yesterday. She said that her landlord told her that she needs to pay her rent by 12/12/16 or she will be receiving an eviction notice.  She noted that she is " working with people" to help pay the rent. [employer] # [phone]. She said that her brother has applied for SSI and medicaid and both are pending.   This CM explained to her the resources offered by [employer] [facility] [place] and she gave verbal consent to make a referral to [employer] [facility] [place] on behalf of her brother and herself.  A referral was discussed with Juanetta Snow, Paralegal - [facility] [place] and the referral was completed by this CM  was faxed to legal aid # [phone]

## 2016-12-12 ENCOUNTER — Ambulatory Visit: Payer: Self-pay | Attending: Family Medicine

## 2016-12-12 MED FILL — !TRUE METRIX BLOOD GLUCOSE: 365 days supply | Qty: 1 | Fill #0

## 2016-12-15 ENCOUNTER — Telehealth: Payer: Self-pay | Admitting: Family Medicine

## 2016-12-15 NOTE — Telephone Encounter (Signed)
It is unfortunate but he will need to come clinic for follow-up visits and could coordinate with care management regarding transportation.

## 2016-12-15 NOTE — Telephone Encounter (Signed)
Pt. Sister called stating she took pt. To the hospital and was told that pt did not have a blood clot. Pt. Sister was told that it was the Amlodipine that was making the pt. Leg swollen. Pt. Is unable to leave his room b/c he stays on the second floor and is unable to come to an OV. Please f/u

## 2016-12-16 ENCOUNTER — Encounter: Payer: Self-pay | Admitting: Licensed Clinical Social Worker

## 2016-12-16 ENCOUNTER — Telehealth: Payer: Self-pay

## 2016-12-16 ENCOUNTER — Telehealth: Payer: Self-pay | Admitting: Family Medicine

## 2016-12-16 ENCOUNTER — Other Ambulatory Visit: Payer: No Typology Code available for payment source

## 2016-12-16 ENCOUNTER — Other Ambulatory Visit: Payer: Self-pay

## 2016-12-16 MED ORDER — GLUCOSE BLOOD VI STRP
1.0000 | ORAL_STRIP | Freq: Three times a day (TID) | 12 refills | Status: DC
Start: 2016-12-16 — End: 2017-07-14

## 2016-12-16 MED ORDER — TRUEPLUS LANCETS 28G MISC: 1.0000 | Freq: Three times a day (TID) | 11 refills | Status: DC

## 2016-12-16 MED FILL — TRUE METRIX TEST STRIP: 30 days supply | Qty: 100 | Fill #0

## 2016-12-16 MED FILL — LISINOPRIL 10 MG TABS: 10 | 30 days supply | Qty: 30 | Fill #0

## 2016-12-16 MED FILL — TRUEplus LANCETS 28G MISC: 30 days supply | Qty: 100 | Fill #0

## 2016-12-16 MED FILL — ?CLOPIDOGREL 75MG TAB: 75 | 30 days supply | Qty: 30 | Fill #0

## 2016-12-16 NOTE — Telephone Encounter (Signed)
Patient's sister came to the office asking to speak with PCP regarding her brother's condition. Eric Morton expressed that in her brother's last ED visit they were informed that the heart and bp medication that he is currently taking is causing the swelling of his leg. Because of this, Eric Morton wants medication to be adjusted for patient. Eric Morton expressed to her that PCP stated that patient needs an office visit, but Eric Morton doesn't know how she can bring her brother in the office. Patient and sister live in a 2nd floor apartment and can't leave the apartment because he needs help coming down and coming back up. And Eric Morton cannot provide this help. Expressed to Eric Morton that I knew of their situation but that I would get case manager and/or nurse to come and speak with her.  While notifying Eric Morton (Case Freight forwarder) and Eric Morton (Education officer, museum) about patient and trying to find a solution (finding a way to get patient to the office considering his condition), Eric Morton left the office because she didn't want to leave her brother alone for too long.  Spoke with Eric Morton, our Development worker, community, and she informed me that patient's Medicaid is pending and that it takes up to 45-90 days for an answer. Patient's sister recently applied for Eric Morton's orange card and therefore they can apply for transportation.   Call placed to [name] [phone], to inquire if they would make an exception for patient and pick him up at his location rather than sending him bus passes. Spoke with Eric Morton and she informed me unfortunately since there would be some type of lifting involved they cannot provide transportation other wise they would be able to pick up patient from location if he was able to get to down (and back up).   Please see [name]'s (CM) for additional details.

## 2016-12-16 NOTE — Progress Notes (Signed)
LCSWA was informed of pt's mobility and transportation barriers which prevents him from coming to the office for medical care.   LCSWA consulted with colleague, Raquel Sarna, who informed LCSWA about non-emergency services that will conduct patient assists free of charge.   Information was provided to [place] and RN CM to assess whether service is appropriate for patient and family.

## 2016-12-16 NOTE — Telephone Encounter (Signed)
As per Kellie Moor, [facility] TCC Coordinator, the patient's sister, [name],  was in the clinic this afternoon inquiring how she could get her brother  to the clinic to see his provider. Venetia Night stated that [name] was concerned about her brother needing an adjustment of his medication.  He is not able to negotiate the stairs from their second floor apartment. He has no insurance, medicaid is pending. No transportation.   Attempted to contact the patient's PT - Gerilyn.  Call placed to [facility], spoke to [place] and this CM left a voicemail message for the PT requesting a call back to # [phone].  Case discussed with Kellie Moor as well as Christa See, LCSW/CHWC.please refer to their notes for additional information.   Dr Jarold Song updated on the patient's need to come to the clinic and the barriers that the patient and his sister face. She noted that she will need to see the patient in order to assess the patient and make any necessary medication adjustments.   Update provided to Dr Doreene Burke about the need to have the patient seen in the clinic but there is no transportation and the patient has no insurance.

## 2016-12-16 NOTE — Telephone Encounter (Signed)
Call placed to the patient's sister, [name], with Kellie Moor, [facility] present and phone on speaker.   [name]  explained her concerns about follow up for her brother. She said she was hoping that the MD at the [facility] would be able to make medication adjustments based on the assessment by the ED provider. Explained to her that her brother will need to be seen by the [facility] provider in order to have medications adjusted.   She said that the [facility] nurse gave her the name of a doctor that makes house calls but her brother would have to have insurance. They don't accept pending medicaid and she would have to pay out of pocket for the doctor home visit.  She explained how she is not able to work, had her car re-possessed and is working with resources at [employer] housing to secure her apartment.  She said that she did receive a call from [employer] of [place] but the only issue discussed was getting a POA established.    She went on to explain that the [facility] PT comes once a week. Her brother is currently able to take only 4-5 steps and he requires assistance with transfers and ADLs. She is the only caregiver and has no other support from family or friends. She stated that he is not able to walk through her house. He is not able to negotiate any stairs, she is on the second floor  and there are 14 cement stairs leading up to her apartment and he can't go down the stairs on his bottom.  She further explained that when she called 911 to take him to the ED last week, the 2 EMTs were not able to get him down the stairs and they had to wait for 2 fire fighters to come to assist. Then the ambulance brought him home from the ED and they had to wait for 2 firefighters to help get him back up stairs. She said that even with a " special chair" to carry him, the firefighters had difficulty getting him upstairs. Explained to her that [facility] is trying to identify resources that would help provide transportation for her brother to get to  the clinic. In the meantime, she stated that she understood that if she has concerns about his medical condition, she would need to call EMS to have him assessed in the ED.   Update provided to Dr Jarold Song.

## 2016-12-17 ENCOUNTER — Telehealth: Payer: Self-pay

## 2016-12-17 NOTE — Telephone Encounter (Signed)
Alonna Minium [facility] speech pathologist is requesting verbal orders to continue for 1 time a wk for 4 weeks.

## 2016-12-17 NOTE — Telephone Encounter (Signed)
Thanks for the update; please refer to my note from 12/15/16 in response to this. Other accomodation options versus doctors making house call are alternatives. Thanks.

## 2016-12-22 ENCOUNTER — Telehealth: Payer: Self-pay

## 2016-12-22 NOTE — Telephone Encounter (Signed)
Call placed to Randel Books, LCSW/ Inpatient Rehab to inquire if she is aware of any resources that were given to the patient and his sister regarding getting him out of the apartment for medical appointments. He is currently in his sister's second floor apartment and is not able to get out of the apartment to get to his medical appointments. She did not have any additional resources for the patient and his sister at this time.

## 2016-12-22 NOTE — Telephone Encounter (Signed)
Ok to cont SLP weekly x 4

## 2016-12-22 NOTE — Telephone Encounter (Signed)
I left a message with Alonna Minium giving the ok for verbal orders on patient.

## 2016-12-23 ENCOUNTER — Telehealth: Payer: Self-pay | Admitting: Family Medicine

## 2016-12-23 ENCOUNTER — Telehealth: Payer: Self-pay

## 2016-12-23 NOTE — Telephone Encounter (Signed)
Call placed to [place] [phone] to get any updates that nurse Cecile Hearing may have regarding patient. Legrand Como informed me that patient has been visited 5 times. Last appointment was on Friday 11/23. There are 8 more visits planned. Legrand Como took my information and stated that he will have Cecile Hearing (nurse) contact Opal Sidles (case Freight forwarder) or I to give [place] an update.

## 2016-12-23 NOTE — Telephone Encounter (Signed)
Call placed to patient's sister, [name]. Explained to her that [facility] is trying to arrange for a provider to make a home visit on Friday, 12/26/16 in the afternoon. [name] was very appreciative and said her brother will soon need refills for his medications and said 12/26/16 in the afternoon is fine for a visit.  Update provided to Dr Doreene Burke.

## 2016-12-24 ENCOUNTER — Telehealth: Payer: Self-pay

## 2016-12-24 NOTE — Telephone Encounter (Signed)
Call placed to patient's sister, [name] to inform her that a home visit by a a [facility] provider is  being planned for 01/02/17 as the provider is not available on 12/26/16. The visit will be in the afternoon and will be confirmed prior to that date. Dr Doreene Burke aware.  [name] stated that her brother is going to run out of the following pills in 5 days and is requesting refills for: gabapentin, pantoprazole, sertraline, amlodipine, glipizide and lisinopril.  Please advise.

## 2016-12-24 NOTE — Telephone Encounter (Signed)
Spoke to Abelino Derrick, [facility] [place] regarding the status of the referral. She stated that they have contacted the patient's sister regarding establishing POA.  This CM also spoke about the issue with housing, a reason for the initial referral, and the sister's concerns about being evicted as well as the patient being on the second floor of the apartment and not being able to get out of the apartment to go to his medical appointments.

## 2016-12-25 MED ORDER — SERTRALINE HCL 25 MG PO TABS: 25.0000 mg | ORAL_TABLET | Freq: Every day | ORAL | 0 refills | Status: DC

## 2016-12-25 MED ORDER — LISINOPRIL 20 MG PO TABS: 20.0000 mg | ORAL_TABLET | Freq: Every day | ORAL | 0 refills | Status: DC

## 2016-12-25 MED ORDER — AMLODIPINE BESYLATE 5 MG PO TABS: 5.0000 mg | ORAL_TABLET | Freq: Every day | ORAL | 0 refills | Status: DC

## 2016-12-25 MED ORDER — PANTOPRAZOLE SODIUM 40 MG PO TBEC: 40.0000 mg | DELAYED_RELEASE_TABLET | Freq: Every day | ORAL | 0 refills | Status: DC

## 2016-12-25 MED ORDER — GABAPENTIN 300 MG PO CAPS: 300.0000 mg | ORAL_CAPSULE | Freq: Every day | ORAL | 0 refills | Status: DC

## 2016-12-25 MED ORDER — GLIPIZIDE 10 MG PO TABS: 10.0000 mg | ORAL_TABLET | Freq: Every day | ORAL | 0 refills | Status: DC

## 2016-12-25 MED FILL — GABAPENTIN 300 MG CAPSULE: 300 | 15 days supply | Qty: 15 | Fill #0

## 2016-12-25 MED FILL — ?PANTOPRAZOLE SOD DR 40MG: 40 MG | 15 days supply | Qty: 15 | Fill #0

## 2016-12-25 MED FILL — AMLODIPINE BESYLATE 5 MG TA: 5 | 15 days supply | Qty: 15 | Fill #0

## 2016-12-25 MED FILL — LISINOPRIL 20 MG TABLET: 20 | 15 days supply | Qty: 15 | Fill #0

## 2016-12-25 MED FILL — SERTRALINE HCL 25 MG TABLET: 25 | 15 days supply | Qty: 15 | Fill #0

## 2016-12-25 MED FILL — ?GLIPIZIDE 10 MG TABLET: 10 | 15 days supply | Qty: 15 | Fill #0

## 2016-12-25 NOTE — Telephone Encounter (Signed)
I have sent a 15 day supply to his pharmacy after which subsequent refills will come from the provider who will be performing the home visit and assuming his care.

## 2016-12-26 ENCOUNTER — Telehealth: Payer: Self-pay

## 2016-12-26 ENCOUNTER — Inpatient Hospital Stay: Payer: No Typology Code available for payment source | Admitting: Physical Medicine & Rehabilitation

## 2016-12-26 ENCOUNTER — Encounter: Payer: No Typology Code available for payment source | Attending: Physical Medicine & Rehabilitation

## 2016-12-26 NOTE — Telephone Encounter (Signed)
As per Crissie Reese, Pharmacy Tech/CHWC, the patient's 6 prescriptions are ready for pick up.  The pharmacy is open until 1715 today.  Call placed to the patient's sister, [name] and informed her of the above. She was very appreciative of the information.

## 2016-12-29 MED FILL — ROSUVASTATIN CALCIUM 40 MG: 40 | 30 days supply | Qty: 30 | Fill #0

## 2016-12-31 ENCOUNTER — Telehealth: Payer: Self-pay

## 2016-12-31 NOTE — Telephone Encounter (Addendum)
As per Abelino Derrick, [facility] [place], the patient's sister turned down the need for POA and Legal Aid is not able to provide assistance with medicaid or disability until the POA is obtained.

## 2017-01-02 ENCOUNTER — Encounter: Payer: Self-pay | Admitting: Nurse Practitioner

## 2017-01-02 ENCOUNTER — Other Ambulatory Visit: Payer: Self-pay | Admitting: Nurse Practitioner

## 2017-01-02 ENCOUNTER — Telehealth: Payer: Self-pay | Admitting: Family Medicine

## 2017-01-02 DIAGNOSIS — E785 Hyperlipidemia, unspecified: Secondary | ICD-10-CM

## 2017-01-02 DIAGNOSIS — I6350 Cerebral infarction due to unspecified occlusion or stenosis of unspecified cerebral artery: Secondary | ICD-10-CM

## 2017-01-02 DIAGNOSIS — E1169 Type 2 diabetes mellitus with other specified complication: Secondary | ICD-10-CM

## 2017-01-02 DIAGNOSIS — I10 Essential (primary) hypertension: Secondary | ICD-10-CM

## 2017-01-02 LAB — POCT CBG MONITORING: CBG: 115

## 2017-01-02 NOTE — Telephone Encounter (Signed)
Call placed to patient's sister to confirm home visit appointment today. During conversation Eric Morton stated that she will be reaching out to Eric Morton from Legal aid regarding the [place]. The first time Eric Morton informed patient of the Eric Morton, he was not understanding what it was therefore didn't want to meet with anyone. Patient is now ready.

## 2017-01-04 ENCOUNTER — Other Ambulatory Visit: Payer: Self-pay | Admitting: Nurse Practitioner

## 2017-01-04 ENCOUNTER — Encounter: Payer: Self-pay | Admitting: Nurse Practitioner

## 2017-01-04 MED ORDER — GABAPENTIN 300 MG PO CAPS: 300.0000 mg | ORAL_CAPSULE | Freq: Every day | ORAL | 1 refills | Status: DC

## 2017-01-04 MED ORDER — ROSUVASTATIN CALCIUM 40 MG PO TABS: 40.0000 mg | ORAL_TABLET | Freq: Every day | ORAL | 1 refills | Status: DC

## 2017-01-04 MED ORDER — LISINOPRIL 20 MG PO TABS: 20.0000 mg | ORAL_TABLET | Freq: Every day | ORAL | 1 refills | Status: DC

## 2017-01-04 MED ORDER — SERTRALINE HCL 25 MG PO TABS: 25.0000 mg | ORAL_TABLET | Freq: Every day | ORAL | 1 refills | Status: DC

## 2017-01-04 MED ORDER — FOLIC ACID 1 MG PO TABS: 1.0000 mg | ORAL_TABLET | Freq: Every day | ORAL | 1 refills | Status: DC

## 2017-01-04 MED ORDER — CLOPIDOGREL BISULFATE 75 MG PO TABS: 75.0000 mg | ORAL_TABLET | Freq: Every day | ORAL | 1 refills | Status: DC

## 2017-01-04 MED ORDER — GLIPIZIDE 10 MG PO TABS: 10.0000 mg | ORAL_TABLET | Freq: Every day | ORAL | 1 refills | Status: DC

## 2017-01-04 MED ORDER — PANTOPRAZOLE SODIUM 40 MG PO TBEC: 40.0000 mg | DELAYED_RELEASE_TABLET | Freq: Every day | ORAL | 1 refills | Status: DC

## 2017-01-04 MED ORDER — AMLODIPINE BESYLATE 5 MG PO TABS: 5.0000 mg | ORAL_TABLET | Freq: Every day | ORAL | 1 refills | Status: DC

## 2017-01-04 NOTE — Progress Notes (Signed)
Assessment & Plan:  Eric Morton was seen today for follow-up.  Diagnoses and all orders for this visit:  Cerebrovascular accident (CVA) due to occlusion of cerebral artery (HCC) -     clopidogrel (PLAVIX) 75 MG tablet; Take 1 tablet (75 mg total) by mouth daily. -     folic acid (FOLVITE) 1 MG tablet; Take 1 tablet (1 mg total) by mouth daily. Continue with PT/OT and Speech therapies as prescribed  Essential hypertension -     amLODipine (NORVASC) 5 MG tablet; Take 1 tablet (5 mg total) by mouth daily. -     lisinopril (PRINIVIL,ZESTRIL) 20 MG tablet; Take 1 tablet (20 mg total) by mouth daily. Check blood pressure daily. Take all medication as prescribed.  DASH DIET  Hyperlipidemia associated with type 2 diabetes mellitus (HCC) -     gabapentin (NEURONTIN) 300 MG capsule; Take 1 capsule (300 mg total) by mouth at bedtime. -     glipiZIDE (GLUCOTROL) 10 MG tablet; Take 1 tablet (10 mg total) by mouth daily before breakfast. -     rosuvastatin (CRESTOR) 40 MG tablet; Take 1 tablet (40 mg total) by mouth daily at 6 PM.  Other orders -     pantoprazole (PROTONIX) 40 MG tablet; Take 1 tablet (40 mg total) by mouth daily at 6 (six) AM. -     sertraline (ZOLOFT) 25 MG tablet; Take 1 tablet (25 mg total) by mouth daily.    Patient has been counseled on age-appropriate routine health concerns for screening and prevention. These are reviewed and up-to-date. Referrals have been placed accordingly. Immunizations are up-to-date or declined.    Subjective:   Chief Complaint  Patient presents with  . Follow-up    Home Health Visit for CVA   HPI Eric Morton 51 y.o. male was seen today at his residence for a home health visit. Both of his sisters were present during my visit today. His sister Eric Morton is his main caregiver. This is my first time visiting the patient and establishing care.   CVA He was admitted to the hospital on 10-23-2016 with worsening right sided weakness and aphasia. 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 MRI showed multiple CVAs with numerous infarcts. Prior to his admission he had a PMH of CVA which occured  in 2017 and resulted in right sided weakness and dysarthria. He also has a history of substance/ETOH abuse,  polycythemia vera with phlebotomy (seeing hem/onc), HTN, DM, and Hyperlipidemia with a history of medication noncompliance.  His UDS was positive on 10-23-2016 for cocaine and he required IV labetalol for hypertension. He was discharged to IP rehab on 10-27-2016. He was discharged home with [facility] on 11-29-2016. A few days after discharge from rehab patient's sister Eric Morton spoke with the SW of [facility] and stated that Eric Morton would not be able to travel to his f/u appointment as he lives on the second floor of an apartment complex and would not be able to be taken downstairs to his car due to lack of assistance with transferring. At that time the decision was made for him to receive a home visit from a provider.  Today patient was assessed medically and medications were reconciled. He is currently receiving speech, physical and occupational therapy in the home. He also has a case manager assigned to him through [place]. His sister has been administering his medications as prescribed. She checks his blood sugar but not routinely. Review of his glucometer shows readings about once a week  with average 160s. We discussed checking his fasting blood glucose levels and an evening post prandial  blood sugar. His CBG was obtained today via his home blood glucose meter with reading 115. He remains dysarthric with right sided weakness and right foot/ankle swelling. Blood pressure is improved today. All medications were refilled today.    BP Readings from Last 2 Encounters:  01/02/17 140/78  12/09/16 (!) 163/102    Review of Systems  Constitutional: Negative for fever, malaise/fatigue and weight loss.  HENT: Negative.  Negative for nosebleeds.   Eyes: Negative for double  vision, photophobia, pain, discharge and redness.  Respiratory: Negative.  Negative for cough, hemoptysis, sputum production and shortness of breath.   Cardiovascular: Positive for leg swelling (right foot/ankle). Negative for chest pain and palpitations.  Gastrointestinal: Positive for constipation and heartburn. Negative for abdominal pain, blood in stool, diarrhea, melena, nausea and vomiting.  Genitourinary: Negative.   Musculoskeletal: Negative.  Negative for myalgias.  Neurological: Positive for tingling, sensory change, speech change, focal weakness and weakness (right sided). Negative for dizziness, tremors, seizures, loss of consciousness and headaches.       Dysarthria  Endo/Heme/Allergies: Negative for environmental allergies.       Polycythemia  Psychiatric/Behavioral: Positive for depression and substance abuse (history of ). Negative for hallucinations and suicidal ideas. The patient is not nervous/anxious and does not have insomnia.     Past Medical History:  Diagnosis Date  . 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 Anxiety   . Chronic kidney disease    "jsut a little damage"  . Glaucoma   . History of stomach ulcers    not confirmed, never underwent diagnostic endo or radiology, this manifested as epigastric distress.   [name] Hx of adenomatous colonic polyps   . Hypercholesterolemia   . Hypertension   . Peripheral neuropathy   . Polycythemia vera ([place]) 2006  . Substance abuse ([place])   . Type II diabetes mellitus ([place])     Past Surgical History:  Procedure Laterality Date  . COLONOSCOPY WITH PROPOFOL N/A 04/28/2014   Procedure: COLONOSCOPY WITH PROPOFOL;  Surgeon: Gatha Mayer, MD;  Location: [place];  Service: Endoscopy;  Laterality: N/A;  . IRRIGATION AND DEBRIDEMENT ABSCESS  09/26/2011   Procedure: IRRIGATION AND DEBRIDEMENT ABSCESS;  Surgeon: Imogene Burn. Georgette Dover, MD;  Location: [place];  Service: General;  Laterality: Left;  Incision and Drainage left  breast abscess    Family History  Problem Relation Age of Onset  . Diabetes Mother   . Hypertension Mother     Social History Reviewed with no changes to be made today.   Outpatient Medications Prior to Visit  Medication Sig Dispense Refill  . aspirin (ASPIRIN CHILDRENS) 81 MG chewable tablet Chew 1 tablet (81 mg total) by mouth daily. 36 tablet 11  . Blood Glucose Monitoring Suppl (TRUE METRIX GO GLUCOSE METER) w/Device KIT USE AS DIRECTED  0  . glucose blood (TRUE METRIX BLOOD GLUCOSE TEST) test strip 1 each by Other route 3 (three) times daily. 100 each 12  . Multiple Vitamin (MULTIVITAMIN WITH MINERALS) TABS tablet Take 1 tablet by mouth daily.    . TRUEPLUS LANCETS 28G MISC 1 each by Does not apply route 3 (three) times daily. 100 each 11  . amLODipine (NORVASC) 5 MG tablet Take 1 tablet (5 mg total) by mouth daily. 15 tablet 0  . clopidogrel (PLAVIX) 75 MG tablet Take 1 tablet (75 mg total) by mouth  daily. 30 tablet 2  . folic acid (FOLVITE) 1 MG tablet Take 1 tablet (1 mg total) by mouth daily. 30 tablet 1  . gabapentin (NEURONTIN) 300 MG capsule Take 1 capsule (300 mg total) by mouth at bedtime. 15 capsule 0  . glipiZIDE (GLUCOTROL) 10 MG tablet Take 1 tablet (10 mg total) by mouth daily before breakfast. 15 tablet 0  . lisinopril (PRINIVIL,ZESTRIL) 20 MG tablet Take 1 tablet (20 mg total) by mouth daily. 15 tablet 0  . pantoprazole (PROTONIX) 40 MG tablet Take 1 tablet (40 mg total) by mouth daily at 6 (six) AM. 15 tablet 0  . rosuvastatin (CRESTOR) 40 MG tablet Take 1 tablet (40 mg total) by mouth daily at 6 PM. 30 tablet 2  . sertraline (ZOLOFT) 25 MG tablet Take 1 tablet (25 mg total) by mouth daily. 15 tablet 0  . Blood Glucose Monitoring Suppl (RELION CONFIRM GLUCOSE MONITOR) w/Device KIT Use as directed (Patient not taking: Reported on 01/04/2017) 1 kit 0  . glucose blood (RELION CONFIRM/MICRO TEST) test strip Use as instructed (Patient not taking: Reported on 01/04/2017) 100  each 12  . lisinopril (PRINIVIL,ZESTRIL) 10 MG tablet Take 10 mg daily by mouth.  2   No facility-administered medications prior to visit.     Allergies  Allergen Reactions  . Lipitor [Atorvastatin] Other (See Comments)    Sister states had to have Cardiac Surgery       Objective:    BP 140/78 (BP Location: Left Arm, Patient Position: Sitting, Cuff Size: Normal)   Pulse 72   Temp 98.7 F (37.1 C) (Oral)   Resp 14  Wt Readings from Last 3 Encounters:  10/27/16 165 lb (74.8 kg)  10/23/16 163 lb 4.8 oz (74.1 kg)  09/23/16 179 lb (81.2 kg)      Physical Exam  Constitutional: No distress.  HENT:  Head: Normocephalic and atraumatic.  Right Ear: Hearing normal.  Left Ear: Hearing normal.  Mouth/Throat: Abnormal dentition.  Eyes: Right eye exhibits no discharge. Left eye exhibits no discharge.  Cardiovascular: Normal rate, regular rhythm and normal heart sounds.  Pulmonary/Chest: Effort normal. No accessory muscle usage. No respiratory distress. He has no decreased breath sounds. He has no wheezes. He has no rhonchi. He has no rales.  Abdominal: Soft. Normal appearance and bowel sounds are normal. There is no tenderness.  Musculoskeletal:       Right shoulder: He exhibits decreased range of motion.       Right elbow: He exhibits decreased range of motion.       Right wrist: He exhibits decreased range of motion.       Right hip: He exhibits decreased range of motion.       Right knee: He exhibits decreased range of motion.       Right ankle: He exhibits decreased range of motion and swelling.       Right foot: There is decreased range of motion and swelling.  Neurological: He is alert. He displays weakness (right sided), facial asymmetry (right sided), abnormal stance and abnormal speech. A cranial nerve deficit and sensory deficit is present. He exhibits abnormal muscle tone (right sided). Coordination and gait abnormal.  Skin: Skin is warm, dry and intact. He is not  diaphoretic.  Poor bilateral foot hygiene. Multiple toes with onychomycosis. Both plantar side of feet are significantly calloused. There are no ulcerations, lesions or visible signs of infection. Right foot and ankle are  swollen. Sister reports swelling decreases with elevation  of affected extremity.  Psychiatric: Mood and affect normal.    Patient has been counseled extensively about nutrition and exercise as well as the importance of adherence with medications and regular follow-up. The patient was given clear instructions to go to ER or return to medical center if symptoms don't improve, worsen or new problems develop. The patient verbalized understanding.   Follow-up: Return in about 2 months (around 03/05/2017).   [name], FNP-BC [facility] [place], [place]   01/04/2017, 9:39 PM

## 2017-01-04 NOTE — Progress Notes (Signed)
This encounter was created in error - please disregard.

## 2017-01-06 ENCOUNTER — Other Ambulatory Visit: Payer: Self-pay | Admitting: Nurse Practitioner

## 2017-01-06 DIAGNOSIS — I6350 Cerebral infarction due to unspecified occlusion or stenosis of unspecified cerebral artery: Secondary | ICD-10-CM

## 2017-01-06 DIAGNOSIS — E1169 Type 2 diabetes mellitus with other specified complication: Secondary | ICD-10-CM

## 2017-01-06 DIAGNOSIS — I10 Essential (primary) hypertension: Secondary | ICD-10-CM

## 2017-01-06 DIAGNOSIS — E785 Hyperlipidemia, unspecified: Principal | ICD-10-CM

## 2017-01-06 MED ORDER — PANTOPRAZOLE SODIUM 40 MG PO TBEC: 40.0000 mg | DELAYED_RELEASE_TABLET | Freq: Every day | ORAL | 1 refills | Status: DC

## 2017-01-06 MED ORDER — GABAPENTIN 300 MG PO CAPS: 300.0000 mg | ORAL_CAPSULE | Freq: Every day | ORAL | 1 refills | Status: DC

## 2017-01-06 MED ORDER — LISINOPRIL 20 MG PO TABS: 20.0000 mg | ORAL_TABLET | Freq: Every day | ORAL | 1 refills | Status: DC

## 2017-01-06 MED ORDER — CLOPIDOGREL BISULFATE 75 MG PO TABS: 75.0000 mg | ORAL_TABLET | Freq: Every day | ORAL | 1 refills | Status: DC

## 2017-01-06 MED ORDER — AMLODIPINE BESYLATE 5 MG PO TABS: 5.0000 mg | ORAL_TABLET | Freq: Every day | ORAL | 1 refills | Status: DC

## 2017-01-06 MED ORDER — FOLIC ACID 1 MG PO TABS: 1.0000 mg | ORAL_TABLET | Freq: Every day | ORAL | 1 refills | Status: DC

## 2017-01-06 MED ORDER — ROSUVASTATIN CALCIUM 40 MG PO TABS: 40.0000 mg | ORAL_TABLET | Freq: Every day | ORAL | 1 refills | Status: DC

## 2017-01-06 MED ORDER — SERTRALINE HCL 25 MG PO TABS: 25.0000 mg | ORAL_TABLET | Freq: Every day | ORAL | 1 refills | Status: DC

## 2017-01-06 MED ORDER — GLIPIZIDE 10 MG PO TABS: 10.0000 mg | ORAL_TABLET | Freq: Every day | ORAL | 1 refills | Status: DC

## 2017-01-12 ENCOUNTER — Other Ambulatory Visit: Payer: Self-pay | Admitting: Family Medicine

## 2017-01-12 ENCOUNTER — Telehealth: Payer: Self-pay | Admitting: *Deleted

## 2017-01-12 ENCOUNTER — Telehealth: Payer: Self-pay

## 2017-01-12 MED FILL — SERTRALINE HCL 25 MG TABLET: 25 | 30 days supply | Qty: 30 | Fill #0 | Status: TO

## 2017-01-12 MED FILL — FOLIC ACID 1 MG TABLET: 1 | 30 days supply | Qty: 30 | Fill #0 | Status: TO

## 2017-01-12 MED FILL — ?PANTOPRAZOLE SOD DR 40MG: 40 MG | 30 days supply | Qty: 30 | Fill #0 | Status: TO

## 2017-01-12 MED FILL — ?CLOPIDOGREL 75MG TAB: 75 | 30 days supply | Qty: 30 | Fill #0 | Status: TO

## 2017-01-12 MED FILL — AMLODIPINE BESYLATE 5 MG TA: 5 | 30 days supply | Qty: 30 | Fill #0 | Status: TO

## 2017-01-12 MED FILL — LISINOPRIL 20 MG TAB: 20 | 30 days supply | Qty: 30 | Fill #0 | Status: TO

## 2017-01-12 MED FILL — ?GLIPIZIDE 10 MG TABLET: 10 | 30 days supply | Qty: 30 | Fill #0 | Status: TO

## 2017-01-12 NOTE — Telephone Encounter (Signed)
Alonna Minium, Speech Pathologist with [facility] left a message asking for verbal orders to extend [place] for 1 week1 to continue swallow therapy.  Verbal orders given per office protocol

## 2017-01-12 NOTE — Telephone Encounter (Signed)
Call received from the patient's sister, [name]. She was requesting that her brother's prescriptions be transferred from [place] to [place].  This CM informed Veneda Melter, [place] of the request. [facility] pharmacy to contact [place]  As per Corene Cornea spoke to the patient's sister and told her to call back around 1500 today to check the status of the refills. His sister stated that they have no insurance and are not able to afford the medications at [facility].    [name] also stated that she spoke to [place] with [employer] of [place] and is waiting to reschedule the appointment to complete the POA documents.

## 2017-01-13 ENCOUNTER — Other Ambulatory Visit: Payer: No Typology Code available for payment source

## 2017-01-13 ENCOUNTER — Ambulatory Visit: Payer: No Typology Code available for payment source | Admitting: Oncology

## 2017-01-28 MED FILL — GABAPENTIN 300 MG CAPSULE: 300 | 30 days supply | Qty: 30 | Fill #0 | Status: TO

## 2017-01-28 MED FILL — ROSUVASTATIN CALCIUM 40 MG: 40 | 30 days supply | Qty: 30 | Fill #1

## 2017-02-02 DIAGNOSIS — I10 Essential (primary) hypertension: Secondary | ICD-10-CM

## 2017-02-04 ENCOUNTER — Telehealth: Payer: Self-pay

## 2017-02-04 NOTE — Telephone Encounter (Signed)
Attempted to contact the patient's sister, [name],to inquire if she has been able to complete the application for POA. Call placed to #  [phone] and a HIPAA compliant voicemail message was left requesting a call back to # [phone].

## 2017-02-05 ENCOUNTER — Telehealth: Payer: Self-pay

## 2017-02-05 NOTE — Telephone Encounter (Signed)
Call received from the patient's sister, [name]. She stated that she left 3 messages for Mickel Baas with [employer] [facility] [place] regarding proceeding with the POA application and she has not had a return call.   She reported that home therapy has stopped for her brother and he is still 24/7 care/supervision so she is not able to get a job.  Her rent has been paid through January.  She also noted that she has not heard anything about his medicaid application. She did state that she received a call about his SSI application and the agency is in the process of collecting his medical information.    Call placed to Abelino Derrick, [employer] [facility] [place] # [phone] to inform her that the patient's sister left messages for Mickel Baas and is waiting for a call back. HIPAA compliant message left requesting a call back to # [phone].

## 2017-02-11 ENCOUNTER — Telehealth: Payer: Self-pay

## 2017-02-11 MED FILL — SERTRALINE HCL 25 MG TABLET: 25 | 30 days supply | Qty: 30 | Fill #1 | Status: TO

## 2017-02-11 MED FILL — LISINOPRIL 20 MG TAB: 20 | 30 days supply | Qty: 30 | Fill #1 | Status: TO

## 2017-02-11 MED FILL — ?PANTOPRAZOLE SOD DR 40MG: 40 MG | 30 days supply | Qty: 30 | Fill #1 | Status: TO

## 2017-02-11 MED FILL — ?GLIPIZIDE 10 MG TABLET: 10 | 30 days supply | Qty: 30 | Fill #1 | Status: TO

## 2017-02-11 MED FILL — ?AMLODIPINE BESYLATE 5 MG T: 5 MG | 30 days supply | Qty: 30 | Fill #1 | Status: TO

## 2017-02-11 MED FILL — ?FOLIC ACID 1 MG TABLET: 1 | 30 days supply | Qty: 30 | Fill #1 | Status: TO

## 2017-02-11 MED FILL — ?CLOPIDOGREL 75MG TABLET: 75 | 30 days supply | Qty: 30 | Fill #1 | Status: TO

## 2017-02-11 NOTE — Telephone Encounter (Signed)
Message received from the patient's sister, [name], stating that she has not heard from legal aid about the POA and she has left multiple messages for Mickel Baas.   Call placed to Abelino Derrick, [employer] [facility] [place] # [phone] and informed her that the patient's sister has not heard from Mickel Baas after leaving multiple messages and she is ready to complete the application for POA. Verdis Frederickson stated that she would speak to Mickel Baas and someone from Legal Aid would contact [place] today.  Call placed to [facility] and informed her of the conversation with Abelino Derrick. Instructed [name] to call this CM back at the end of the day if she has not heard from Legal Aid and she stated that she would call if needed.

## 2017-02-12 ENCOUNTER — Telehealth: Payer: Self-pay

## 2017-02-12 NOTE — Telephone Encounter (Signed)
Message receive from Eustace Quail with [employer] of [place] noting that she has scheduled an appointment with the patient's sister on 02/19/17 @ 1000.

## 2017-02-17 ENCOUNTER — Telehealth: Payer: Self-pay

## 2017-02-17 NOTE — Telephone Encounter (Signed)
Call received from the patient's sister, [name]. She was reporting a new cell phone # for herself.  Epic was updated. She also confirmed that she is meeting with Mickel Baas from [employer] [facility] [place] this week.

## 2017-03-04 ENCOUNTER — Telehealth: Payer: Self-pay | Admitting: Family Medicine

## 2017-03-04 ENCOUNTER — Telehealth: Payer: Self-pay

## 2017-03-04 NOTE — Telephone Encounter (Signed)
Met with Eric Morton, [employer] [facility] [place].  She stated that the POA has been signed and the patient has been approved for medicaid. Eric Morton said that they will be working with the patient's sister to try to secure some assistance with caring for the patient. This CM suggested PCS referral at this time as there is a wait for CAP services. Eric Morton was in agreement.   Update provided to Eric Rankins, NP and she is agreeable to a PCS referral.    Call placed to the patient's sister, Eric Morton # [phone] to discuss PCS services now that he has been approved for medicaid. Voicemail message left requesting a call back to # [phone].

## 2017-03-04 NOTE — Telephone Encounter (Signed)
Patients sister returned phone call, patient was informed you are gone for the day and she stated she would call the next business day.

## 2017-03-05 ENCOUNTER — Telehealth: Payer: Self-pay | Admitting: Family Medicine

## 2017-03-05 DIAGNOSIS — E785 Hyperlipidemia, unspecified: Secondary | ICD-10-CM

## 2017-03-05 DIAGNOSIS — I10 Essential (primary) hypertension: Secondary | ICD-10-CM

## 2017-03-05 DIAGNOSIS — I6350 Cerebral infarction due to unspecified occlusion or stenosis of unspecified cerebral artery: Secondary | ICD-10-CM

## 2017-03-05 DIAGNOSIS — E1169 Type 2 diabetes mellitus with other specified complication: Secondary | ICD-10-CM

## 2017-03-05 MED ORDER — GLIPIZIDE 10 MG PO TABS: 10.0000 mg | ORAL_TABLET | Freq: Every day | ORAL | 0 refills | Status: DC

## 2017-03-05 MED ORDER — CLOPIDOGREL BISULFATE 75 MG PO TABS: 75.0000 mg | ORAL_TABLET | Freq: Every day | ORAL | 0 refills | Status: AC

## 2017-03-05 MED ORDER — GABAPENTIN 300 MG PO CAPS: 300.0000 mg | ORAL_CAPSULE | Freq: Every day | ORAL | 0 refills | Status: DC

## 2017-03-05 MED ORDER — FOLIC ACID 1 MG PO TABS: 1.0000 mg | ORAL_TABLET | Freq: Every day | ORAL | 0 refills | Status: AC

## 2017-03-05 MED ORDER — LISINOPRIL 20 MG PO TABS: 20.0000 mg | ORAL_TABLET | Freq: Every day | ORAL | 0 refills | Status: DC

## 2017-03-05 MED ORDER — SERTRALINE HCL 25 MG PO TABS: 25.0000 mg | ORAL_TABLET | Freq: Every day | ORAL | 0 refills | Status: DC

## 2017-03-05 MED ORDER — ROSUVASTATIN CALCIUM 40 MG PO TABS: 40.0000 mg | ORAL_TABLET | Freq: Every day | ORAL | 0 refills | Status: DC

## 2017-03-05 MED ORDER — PANTOPRAZOLE SODIUM 40 MG PO TBEC: 40.0000 mg | DELAYED_RELEASE_TABLET | Freq: Every day | ORAL | 0 refills | Status: DC

## 2017-03-05 MED ORDER — AMLODIPINE BESYLATE 5 MG PO TABS: 5.0000 mg | ORAL_TABLET | Freq: Every day | ORAL | 0 refills | Status: DC

## 2017-03-05 NOTE — Telephone Encounter (Signed)
Refilled x 30 days - needs follow up visit with [facility]

## 2017-03-05 NOTE — Telephone Encounter (Signed)
Call placed patient's sister [name] [phone] regarding patient's status and information on CAP and PCS services. During our conversation [name] stated that patient will be out of gabapentin (NEURONTIN) 300 MG capsule and rosuvastatin (CRESTOR) 40 MG tablet before the end of the week. [name] asked that it be refilled and sent to [facility] on [address]. She also asked that all his medication be sent and refilled to [facility] since he will be needing refills soon.   Thank you.

## 2017-03-05 NOTE — Telephone Encounter (Signed)
Patient has not been seen by Dr. Margarita Rana since May 2018, will forward request to PCP to determine if she is still PCP or if Geryl Rankins will be providing care.

## 2017-03-05 NOTE — Telephone Encounter (Signed)
Call placed to patient's sister Eric Morton [phone], regarding patient's status and information on CAP and PCS services. Informed her that Opal Sidles (Tourist information centre manager) tried to contact her yesterday to inform her that Legal Aid was going to check on CAP services for patient but there was a wait. Explained to Eric Morton what CAP services is. She stated that she can be the one to offer those services since patient lives with her and she takes care of him already. I informed Eric Morton that I would let Opal Sidles know of that. I explained to Eric Morton that her brother can benefit from [facility] services in the meantime. Eric Morton stated that she would like [place] to provide those services.   During our conversation, Eric Morton stated that patient has been experiencing pain on his right shoulder. She has been rubbing a lidocaine cream (that was prescribed to her in the past) and it has helped a bit but the pain is still there. Informed Eric Morton that I would let provider know of this.   Eric Morton also requested for medication refills for patient (gabapentin and crestor) and asked for it to  be sent to a new pharmacy ([facility] on [address]). She also asked that all medication be transferred to new pharmacy. Information was sent to Theda Sers, [facility] pharmacist.

## 2017-03-05 NOTE — Telephone Encounter (Signed)
You may need to check with Zelda. I am not his PCP.

## 2017-03-06 ENCOUNTER — Telehealth: Payer: Self-pay

## 2017-03-06 NOTE — Telephone Encounter (Signed)
Call placed to the patient's sister, Eric Morton and obtained his medicaid # [id number] effective 09/27/2016. This number was repeated back to [place] and confirmed.  She stated that she was interested in getting paid for caring for him.  Explained to her that the CAP program does pay caregivers but there is a wait for that program.  Encouraged her to call CAP and put his name on the waiting list.  Provided her with the # for CAP - [phone]. This CM then explained that in the meantime a referral can be made for [facility] and she was in agreement.    As per [facility] scheduler, the medicaid number provided is inactive as per [place] tracks.   Attempted to contact [name] to inquire if the medicaid is specified as [employer] and inform her that as per [facility] [employer], the number is inactive.     Call placed to  [phone] (H) and a HIPAA compliant voicemail message was left requesting a call back to [phone].

## 2017-03-10 ENCOUNTER — Telehealth: Payer: Self-pay

## 2017-03-10 ENCOUNTER — Telehealth: Payer: Self-pay | Admitting: Family Medicine

## 2017-03-10 MED FILL — GABAPENTIN 300 MG CAPSULE: 300 | 30 days supply | Qty: 30 | Fill #1 | Status: TO

## 2017-03-10 MED FILL — SERTRALINE HCL 25 MG TABLET: 25 | 30 days supply | Qty: 30 | Fill #2 | Status: TO

## 2017-03-10 MED FILL — LISINOPRIL 20 MG TAB: 20 | 30 days supply | Qty: 30 | Fill #2 | Status: TO

## 2017-03-10 MED FILL — FOLIC ACID 1 MG TABLET: 1 | 30 days supply | Qty: 30 | Fill #2 | Status: TO

## 2017-03-10 MED FILL — AMLODIPINE BESYLATE 5 MG TA: 5 | 30 days supply | Qty: 30 | Fill #2 | Status: TO

## 2017-03-10 MED FILL — glipiZIDE 10 MG TABS: 10 | 30 days supply | Qty: 30 | Fill #2 | Status: TO

## 2017-03-10 MED FILL — PANTOPRAZOLE SOD DR 40 MG T: 40 | 30 days supply | Qty: 30 | Fill #2 | Status: TO

## 2017-03-10 MED FILL — CLOPIDOGREL 75 MG TABLET: 75 | 30 days supply | Qty: 30 | Fill #2 | Status: TO

## 2017-03-10 MED FILL — ROSUVASTATIN CALCIUM 40 MG: 40 | 30 days supply | Qty: 30 | Fill #2 | Status: TO

## 2017-03-10 NOTE — Telephone Encounter (Signed)
Call placed to patient's sister [name] [phone], regarding patient's Medicaid information. Spoke with [name] and she confirmed that patient's medicaid ID is [id number]. She also informed me that although it appears that patient's Medicaid is inactive on the  tracks website, patient's case worker, Denman George confirmed that patient does have Medicaid. [name] also stated that she called the [facility] office and they also confirmed that patient's Medicaid is active but that case worker will have to put in a ticket to have things corrected on the  tracks website.   [name] also informed me that because of this problem, patient is not able to pick up his medication from [facility] unless he pays it all out of pocket and patient is in need of his medication. Informed [name] that I would inform Opal Sidles (Tourist information centre manager) as well as speak with someone from pharmacy to see if we can transfer patient's medication to our pharmacy until problem is resolved.

## 2017-03-10 NOTE — Telephone Encounter (Signed)
Call returned to the patient's sister, Eric Morton. She stated that she has contacted the patient's medicaid caseworker as well as DSS in [place] to address the problem that he is having with his medicaid status. She stated that she was informed that this will take more than a day or 2 to resolve. She is planning to pick up his medications at [facility] tomorrow as she can put the charges on an account to pay off as she is able.  Reminded her that an application for PCS can be submitted when his medicaid status is activated.

## 2017-03-10 NOTE — Telephone Encounter (Signed)
Spoke with Altha Harm, pharmacy tech, regarding patient's medication and the problem with his insurance. Christine informed me that patient's medication can be transferred to [facility] pharmacy. Patient will have co pays but can be placed into an account and patient can make payments towards it. Informed [name] (CM) of my call with [name] and she also stated and confirmed that patient can use our pharmacy.   Call placed to [name] [phone], and informed her of my conversation with Altha Harm and Opal Sidles. Informed her that Altha Harm will contact [facility] to make the transfer but medication might not be ready until tomorrow Wednesday. Advised [name] to call the office tomorrow to check on the status of the medication. Also informed her that she can bring the Power of Attorney whenever patient has an appointment or whenever she can. [name] understood and had no further questions.

## 2017-03-13 ENCOUNTER — Telehealth: Payer: Self-pay | Admitting: Nurse Practitioner

## 2017-03-13 DIAGNOSIS — I10 Essential (primary) hypertension: Secondary | ICD-10-CM

## 2017-03-13 NOTE — Telephone Encounter (Signed)
Call placed to patient's sister, [name], [phone], to ask her what day is best for provider to do a home visit. No answer. Left [name] a message to return my call at [phone].

## 2017-03-18 ENCOUNTER — Telehealth: Payer: Self-pay

## 2017-03-18 NOTE — Telephone Encounter (Signed)
This CM met with Abelino Derrick, [employer] [facility] [place] and provided her with an update about the discrepancy with patient's medicaid status.

## 2017-03-24 ENCOUNTER — Telehealth: Payer: Self-pay | Admitting: Nurse Practitioner

## 2017-03-24 NOTE — Telephone Encounter (Signed)
Call placed to [name] regarding a home visit and insurance. Spoke with [name] and she stated that patient's Medicaid was resolved and should now be active. Checked on Epic and it confirmed that insurance is active. Informed [name] that a home visit has been scheduled for March 22, and she agreed to have provider come out that day.   During our conversation, [name] stated that patient is not sleeping at night and stays up watching TV. This doesn't allow her to sleep at night either. Patient sleeps during the day instead and she is concerned about this. Informed [name] of PCS services and she agreed to start process but also wants to speak with provider about a long term facility. [name] is a bit tired and her "legs are giving out". She also stated that patient still has pain on his right shoulder. She is putting lidocaine cream on that area but is not helping much. Informed [name] that I would notify provider of this.

## 2017-03-27 ENCOUNTER — Telehealth: Payer: Self-pay | Admitting: Nurse Practitioner

## 2017-03-27 NOTE — Telephone Encounter (Signed)
Faxed patient's PCS service application to [employer] [phone].

## 2017-03-29 ENCOUNTER — Other Ambulatory Visit: Payer: Self-pay | Admitting: Nurse Practitioner

## 2017-03-29 MED ORDER — DICLOFENAC SODIUM 1 % TD GEL: 2.0000 g | Freq: Four times a day (QID) | TRANSDERMAL | 1 refills | Status: DC

## 2017-03-29 NOTE — Telephone Encounter (Signed)
Please let patient's sister know that I have sent a gel for her to apply to Chae's shoulder.  It has been sent to [facility].

## 2017-03-30 ENCOUNTER — Other Ambulatory Visit: Payer: Self-pay | Admitting: Nurse Practitioner

## 2017-03-30 MED ORDER — DICLOFENAC SODIUM 1 % TD GEL: 2.0000 g | Freq: Four times a day (QID) | TRANSDERMAL | 1 refills | Status: DC

## 2017-03-30 NOTE — Telephone Encounter (Signed)
CMA spoke to Patient's sister to inform her Rx has been sent.  Patient's sister request if it can be send to [facility] on [address].

## 2017-03-30 NOTE — Telephone Encounter (Signed)
Sent!

## 2017-03-31 ENCOUNTER — Telehealth: Payer: Self-pay | Admitting: Nurse Practitioner

## 2017-03-31 NOTE — Telephone Encounter (Signed)
5page paperwork received through fax [phone], 03-31-17.

## 2017-03-31 NOTE — Telephone Encounter (Signed)
Call placed to [facility] [phone], regarding patient referral for [facility] services. Spoke with Tammy and she informed me that document was received yesterday but cannot be processed because of missing information. Tammy asked that once it is completed to fax it again.

## 2017-04-01 ENCOUNTER — Telehealth: Payer: Self-pay

## 2017-04-01 NOTE — Telephone Encounter (Signed)
As per Eric Morton, [employer] [place], they have closed the case with the patient.  His disability is still pending and his sister has their contact information should she need their assistance in the future.

## 2017-04-02 ENCOUNTER — Telehealth: Payer: Self-pay | Admitting: Nurse Practitioner

## 2017-04-02 NOTE — Telephone Encounter (Signed)
Call placed to [facility] [phone] regarding patient's PCS application. Spoke with Sonia Baller and she informed me that application was received. They will be reaching out to patient soon to schedule an assessment.

## 2017-04-10 ENCOUNTER — Other Ambulatory Visit: Payer: Self-pay | Admitting: Internal Medicine

## 2017-04-10 DIAGNOSIS — E785 Hyperlipidemia, unspecified: Principal | ICD-10-CM

## 2017-04-10 DIAGNOSIS — E1169 Type 2 diabetes mellitus with other specified complication: Secondary | ICD-10-CM

## 2017-04-17 ENCOUNTER — Telehealth: Payer: Self-pay | Admitting: Nurse Practitioner

## 2017-04-17 NOTE — Telephone Encounter (Signed)
Call placed to patient's sister [name] [phone], to remind her that Geryl Rankins NP has been scheduled to do a home visit today at 4 pm. Spoke with [place] and she confirmed.

## 2017-04-18 ENCOUNTER — Other Ambulatory Visit: Payer: Self-pay | Admitting: Nurse Practitioner

## 2017-04-18 DIAGNOSIS — I10 Essential (primary) hypertension: Secondary | ICD-10-CM

## 2017-04-18 DIAGNOSIS — E1169 Type 2 diabetes mellitus with other specified complication: Secondary | ICD-10-CM

## 2017-04-18 DIAGNOSIS — E785 Hyperlipidemia, unspecified: Principal | ICD-10-CM

## 2017-04-18 MED ORDER — LISINOPRIL-HYDROCHLOROTHIAZIDE 20-25 MG PO TABS: 1.0000 | ORAL_TABLET | Freq: Every day | ORAL | 3 refills | Status: DC

## 2017-04-18 MED ORDER — METFORMIN HCL 500 MG PO TABS: 500.0000 mg | ORAL_TABLET | Freq: Two times a day (BID) | ORAL | 3 refills | Status: DC

## 2017-04-18 MED ORDER — SERTRALINE HCL 25 MG PO TABS: 25.0000 mg | ORAL_TABLET | Freq: Every day | ORAL | 0 refills | Status: DC

## 2017-04-18 MED ORDER — PANTOPRAZOLE SODIUM 40 MG PO TBEC: 40.0000 mg | DELAYED_RELEASE_TABLET | Freq: Every day | ORAL | 0 refills | Status: DC

## 2017-04-18 MED ORDER — GLIPIZIDE 10 MG PO TABS: 10.0000 mg | ORAL_TABLET | Freq: Every day | ORAL | 0 refills | Status: DC

## 2017-04-18 MED ORDER — GABAPENTIN 300 MG PO CAPS: 300.0000 mg | ORAL_CAPSULE | Freq: Every day | ORAL | 0 refills | Status: DC

## 2017-04-18 MED ORDER — DICLOFENAC SODIUM 1 % TD GEL: 2.0000 g | Freq: Four times a day (QID) | TRANSDERMAL | 1 refills | Status: DC

## 2017-04-18 MED ORDER — ROSUVASTATIN CALCIUM 40 MG PO TABS
40.0000 mg | ORAL_TABLET | Freq: Every day | ORAL | 0 refills | Status: DC
Start: 2017-04-18 — End: 2017-07-14

## 2017-04-18 MED ORDER — ROSUVASTATIN CALCIUM 40 MG PO TABS: 40.0000 mg | ORAL_TABLET | Freq: Every day | ORAL | 0 refills | Status: DC

## 2017-04-21 ENCOUNTER — Telehealth: Payer: Self-pay | Admitting: Nurse Practitioner

## 2017-04-21 NOTE — Telephone Encounter (Signed)
Call placed to patient's sister, [name], [phone], regarding transportation and PCS services. Spoke with [name] and she informed me that patient is receiving PCS services. Informed and explained to  [name] about Medicaid transportation and SCAT.  She mentioned that although patient was able to go down the stairs with the help of someone, she and provider agree that patient needs more practice and time in order to do it by himself. Putting a hold on transportation for now. Will ask provider when next appointment is.  [name] also mentioned that they are working on CAPS program.   During the end of the conversation, [name] wanted me to inform provider that patient's sugar was a bit high and she wanted to know if patient needs to take metformin and if so, rx needs to be called in.   Informed [name] that I would relate message to provider. Message forwarded to patient.

## 2017-04-24 ENCOUNTER — Telehealth: Payer: Self-pay | Admitting: Nurse Practitioner

## 2017-04-24 NOTE — Telephone Encounter (Signed)
Please call patient sister back about the metformin and she wants to know when is he supposed to get labs. Please fu at [phone]

## 2017-04-26 NOTE — Telephone Encounter (Signed)
Please let patient's sister know that I sent his metformin on the 23rd of March to the pharmacy along with his other medications. We can not send a nurse out to draw labs. I believe we should be able to hold off on labs and schedule him for an actual office visit in 2 months.

## 2017-04-27 ENCOUNTER — Telehealth: Payer: Self-pay

## 2017-04-27 NOTE — Telephone Encounter (Signed)
Patient sister call regarding a referral to the eye doctor

## 2017-04-27 NOTE — Telephone Encounter (Signed)
Will route to PCP 

## 2017-04-27 NOTE — Telephone Encounter (Signed)
CMA spoke to Patient's sister and inform her to make an OV with PCP and will have lab done that day.  CMA inform patient's sister also that the [facility] should have his medication ready.

## 2017-04-29 ENCOUNTER — Other Ambulatory Visit: Payer: Self-pay | Admitting: Nurse Practitioner

## 2017-04-29 DIAGNOSIS — Z0100 Encounter for examination of eyes and vision without abnormal findings: Principal | ICD-10-CM

## 2017-04-29 DIAGNOSIS — E119 Type 2 diabetes mellitus without complications: Secondary | ICD-10-CM

## 2017-04-29 NOTE — Telephone Encounter (Signed)
Referral placed.

## 2017-05-19 LAB — HM DIABETES EYE EXAM

## 2017-05-20 ENCOUNTER — Telehealth: Payer: Self-pay | Admitting: Nurse Practitioner

## 2017-05-20 NOTE — Telephone Encounter (Signed)
Received fax from [place] from [facility] requesting orders, fax will on the pcp in-box

## 2017-05-29 ENCOUNTER — Telehealth: Payer: Self-pay | Admitting: Nurse Practitioner

## 2017-05-29 NOTE — Telephone Encounter (Signed)
Dellia Cloud [phone] from the [facility] called asking to speak with CMA or provider in order to get a copy of patient's medical diagnoses, medication list and latest office visit notes in order to process patient's CAP application. Please fax information to [phone] and make it attention to: Dellia Cloud.

## 2017-06-01 NOTE — Telephone Encounter (Signed)
CMA faxed the latest OV and notes to Dellia Cloud.

## 2017-06-09 ENCOUNTER — Telehealth: Payer: Self-pay

## 2017-06-09 NOTE — Telephone Encounter (Signed)
Call received from patient's sister, Eric Morton.  She stated that she is trying to have the patient's disability checks direct deposited. She has contacted social security and made this request on his behalf as his POA.  She has documentation to support her POA. However, social security would not accept her POA and stated that the patient needs to come to the office to sign documentation authorizing direct deposit.  She explained his medical condition and they would not accept that and informed her that his provider would need to write a letter noting his diagnosis and the reason why he can't get to the social security office. Eric Morton stated that despite the fact that he is more mobile than he was, he is now having difficulty seeing and he is not able to sign papers.  She stated that he has been to the eye doctor and reports of that visit were to be sent to [facility]. She stated that the letter also needs to state that Eric Morton is his caregiver.   Informed her that Ms [name], NP  would be notified of the request.

## 2017-06-18 ENCOUNTER — Other Ambulatory Visit: Payer: Self-pay | Admitting: Internal Medicine

## 2017-06-18 ENCOUNTER — Telehealth: Payer: Self-pay

## 2017-06-19 ENCOUNTER — Other Ambulatory Visit: Payer: Self-pay

## 2017-06-19 MED ORDER — DICLOFENAC SODIUM 1 % TD GEL: 2.0000 g | Freq: Four times a day (QID) | TRANSDERMAL | 1 refills | Status: DC

## 2017-06-19 NOTE — Progress Notes (Signed)
[facility] request to refill Dicolofenac 1% gel 100GM.  Rx sent.

## 2017-06-19 NOTE — Telephone Encounter (Signed)
CMA gave the form to Dr. Margarita Rana and informed to fill out per [employer] Melia is will not back until June.

## 2017-06-23 ENCOUNTER — Other Ambulatory Visit: Payer: Self-pay | Admitting: Nurse Practitioner

## 2017-06-24 ENCOUNTER — Telehealth: Payer: Self-pay

## 2017-06-24 NOTE — Telephone Encounter (Signed)
CMA left a VM for Sharyn Lull to inform that Opal Sidles is reviewing the paperwork and will have [place]lin sign by tomorrow.

## 2017-06-24 NOTE — Telephone Encounter (Signed)
JA 

## 2017-07-14 ENCOUNTER — Ambulatory Visit: Payer: No Typology Code available for payment source | Attending: Nurse Practitioner | Admitting: Nurse Practitioner

## 2017-07-14 ENCOUNTER — Telehealth: Payer: Self-pay

## 2017-07-14 ENCOUNTER — Encounter: Payer: Self-pay | Admitting: Nurse Practitioner

## 2017-07-14 VITALS — BP 97/60 | HR 87 | Temp 97.8°F | Wt 156.0 lb

## 2017-07-14 DIAGNOSIS — E1169 Type 2 diabetes mellitus with other specified complication: Secondary | ICD-10-CM

## 2017-07-14 DIAGNOSIS — H409 Unspecified glaucoma: Secondary | ICD-10-CM | POA: Insufficient documentation

## 2017-07-14 DIAGNOSIS — N189 Chronic kidney disease, unspecified: Secondary | ICD-10-CM | POA: Insufficient documentation

## 2017-07-14 DIAGNOSIS — D45 Polycythemia vera: Secondary | ICD-10-CM | POA: Diagnosis not present

## 2017-07-14 DIAGNOSIS — E46 Unspecified protein-calorie malnutrition: Secondary | ICD-10-CM | POA: Diagnosis not present

## 2017-07-14 DIAGNOSIS — E785 Hyperlipidemia, unspecified: Secondary | ICD-10-CM | POA: Diagnosis not present

## 2017-07-14 DIAGNOSIS — K219 Gastro-esophageal reflux disease without esophagitis: Secondary | ICD-10-CM | POA: Diagnosis not present

## 2017-07-14 DIAGNOSIS — I10 Essential (primary) hypertension: Secondary | ICD-10-CM

## 2017-07-14 DIAGNOSIS — E1142 Type 2 diabetes mellitus with diabetic polyneuropathy: Secondary | ICD-10-CM | POA: Insufficient documentation

## 2017-07-14 DIAGNOSIS — F419 Anxiety disorder, unspecified: Secondary | ICD-10-CM | POA: Insufficient documentation

## 2017-07-14 DIAGNOSIS — Z79899 Other long term (current) drug therapy: Secondary | ICD-10-CM | POA: Insufficient documentation

## 2017-07-14 DIAGNOSIS — Z794 Long term (current) use of insulin: Secondary | ICD-10-CM | POA: Insufficient documentation

## 2017-07-14 DIAGNOSIS — E7800 Pure hypercholesterolemia, unspecified: Secondary | ICD-10-CM | POA: Insufficient documentation

## 2017-07-14 DIAGNOSIS — I69351 Hemiplegia and hemiparesis following cerebral infarction affecting right dominant side: Secondary | ICD-10-CM | POA: Diagnosis not present

## 2017-07-14 DIAGNOSIS — Z9119 Patient's noncompliance with other medical treatment and regimen: Secondary | ICD-10-CM | POA: Insufficient documentation

## 2017-07-14 DIAGNOSIS — Z8719 Personal history of other diseases of the digestive system: Secondary | ICD-10-CM | POA: Insufficient documentation

## 2017-07-14 DIAGNOSIS — Z8249 Family history of ischemic heart disease and other diseases of the circulatory system: Secondary | ICD-10-CM | POA: Insufficient documentation

## 2017-07-14 DIAGNOSIS — E1122 Type 2 diabetes mellitus with diabetic chronic kidney disease: Secondary | ICD-10-CM | POA: Insufficient documentation

## 2017-07-14 DIAGNOSIS — Z7982 Long term (current) use of aspirin: Secondary | ICD-10-CM | POA: Insufficient documentation

## 2017-07-14 DIAGNOSIS — M25511 Pain in right shoulder: Secondary | ICD-10-CM | POA: Insufficient documentation

## 2017-07-14 DIAGNOSIS — I129 Hypertensive chronic kidney disease with stage 1 through stage 4 chronic kidney disease, or unspecified chronic kidney disease: Secondary | ICD-10-CM | POA: Insufficient documentation

## 2017-07-14 LAB — POCT URINALYSIS DIPSTICK
BILIRUBIN UA: NEGATIVE
Glucose, UA: POSITIVE — AB
Ketones, UA: NEGATIVE
Leukocytes, UA: NEGATIVE
Nitrite, UA: NEGATIVE
Protein, UA: POSITIVE — AB
Spec Grav, UA: 1.015 (ref 1.010–1.025)
Urobilinogen, UA: 0.2 E.U./dL
pH, UA: 5 (ref 5.0–8.0)

## 2017-07-14 LAB — POCT GLYCOSYLATED HEMOGLOBIN (HGB A1C): Hemoglobin A1C: 13.2 % — AB (ref 4.0–5.6)

## 2017-07-14 LAB — GLUCOSE, POCT (MANUAL RESULT ENTRY)
POC GLUCOSE: 469 mg/dL — AB (ref 70–99)
POC Glucose: 461 mg/dl — AB (ref 70–99)

## 2017-07-14 MED ORDER — GLUCOSE BLOOD VI STRP: 1.0000 | ORAL_STRIP | Freq: Three times a day (TID) | 12 refills | Status: DC

## 2017-07-14 MED ORDER — CLOPIDOGREL BISULFATE 75 MG PO TABS: 75.0000 mg | ORAL_TABLET | Freq: Every day | ORAL | 2 refills | Status: DC

## 2017-07-14 MED ORDER — TRUEPLUS LANCETS 28G MISC: 1.0000 | Freq: Three times a day (TID) | 11 refills | Status: DC

## 2017-07-14 MED ORDER — GLIPIZIDE 10 MG PO TABS
10.0000 mg | ORAL_TABLET | Freq: Every day | ORAL | 3 refills | Status: DC
Start: 2017-07-14 — End: 2017-07-21

## 2017-07-14 MED ORDER — SERTRALINE HCL 25 MG PO TABS: 25.0000 mg | ORAL_TABLET | Freq: Every day | ORAL | 2 refills | Status: DC

## 2017-07-14 MED ORDER — INSULIN PEN NEEDLE 31G X 5 MM MISC: 1 refills | Status: DC

## 2017-07-14 MED ORDER — PANTOPRAZOLE SODIUM 40 MG PO TBEC: 40.0000 mg | DELAYED_RELEASE_TABLET | Freq: Every day | ORAL | 2 refills | Status: DC

## 2017-07-14 MED ORDER — INSULIN GLARGINE 100 UNIT/ML SOLOSTAR PEN: 20.0000 [IU] | PEN_INJECTOR | Freq: Every day | SUBCUTANEOUS | 11 refills | Status: DC

## 2017-07-14 MED ORDER — GABAPENTIN 300 MG PO CAPS
300.0000 mg | ORAL_CAPSULE | Freq: Three times a day (TID) | ORAL | 3 refills | Status: DC
Start: 2017-07-14 — End: 2017-12-22

## 2017-07-14 MED ORDER — INSULIN ASPART 100 UNIT/ML ~~LOC~~ SOLN
20.0000 [IU] | Freq: Once | SUBCUTANEOUS | Status: AC
  Administered 2017-07-14: 20 [IU] via SUBCUTANEOUS

## 2017-07-14 MED ORDER — LISINOPRIL-HYDROCHLOROTHIAZIDE 20-25 MG PO TABS: 1.0000 | ORAL_TABLET | Freq: Every day | ORAL | 3 refills | Status: DC

## 2017-07-14 MED ORDER — AMLODIPINE BESYLATE 5 MG PO TABS: 5.0000 mg | ORAL_TABLET | Freq: Every day | ORAL | 2 refills | Status: DC

## 2017-07-14 MED ORDER — ROSUVASTATIN CALCIUM 40 MG PO TABS: 40.0000 mg | ORAL_TABLET | Freq: Every day | ORAL | 2 refills | Status: DC

## 2017-07-14 MED ORDER — METFORMIN HCL 1000 MG PO TABS: 1000.0000 mg | ORAL_TABLET | Freq: Two times a day (BID) | ORAL | 6 refills | Status: DC

## 2017-07-14 NOTE — Patient Instructions (Signed)
Diabetes blood sugar goals  Fasting in AM before breakfast which means at least 8 hrs of no eating or drinking) except water or unsweetened coffee or tea): 90-110 2 hrs after meals: < 160,   Hypoglycemia or low blood sugar: < 70 (You should not have hypoglycemia.)  Aim for 30 minutes of exercise most days. Rethink what you drink. Water is great! Aim for 2-3 Carb Choices per meal (30-45 grams) +/- 1 either way  Aim for 0-15 Carbs per snack if hungry  Include protein in moderation with your meals and snacks  Consider reading food labels for Total Carbohydrate and Fat Grams of foods  Consider checking BG at alternate times per day  Continue taking medication as directed Be mindful about how much sugar you are adding to beverages and other foods. Fruit Punch - find one with no sugar  Measure and decrease portions of carbohydrate foods  Make your plate and don't go back for seconds 

## 2017-07-14 NOTE — Progress Notes (Signed)
Assessment & Plan:  Eric Morton was seen today for shoulder pain.  Diagnoses and all orders for this visit:  Type 2 diabetes mellitus with diabetic polyneuropathy, without long-term current use of insulin (HCC) -     CBC -     CMP14+EGFR -     insulin aspart (novoLOG) injection 20 Units -     metFORMIN (GLUCOPHAGE) 1000 MG tablet; Take 1 tablet (1,000 mg total) by mouth 2 (two) times daily with a meal. -     Insulin Glargine (LANTUS) 100 UNIT/ML Solostar Pen; Inject 20 Units into the skin daily at 10 pm. -     Insulin Pen Needle (B-D UF III MINI PEN NEEDLES) 31G X 5 MM MISC; Use as instructed -     glucose blood (TRUE METRIX BLOOD GLUCOSE TEST) test strip; 1 each by Other route 3 (three) times daily. -     glipiZIDE (GLUCOTROL) 10 MG tablet; Take 1 tablet (10 mg total) by mouth daily before breakfast. -     gabapentin (NEURONTIN) 300 MG capsule; Take 1 capsule (300 mg total) by mouth 3 (three) times daily. -     TRUEPLUS LANCETS 28G MISC; 1 each by Does not apply route 3 (three) times daily. -     Glucose (CBG) -     Urinalysis Dipstick Continue blood sugar control as discussed in office today, low carbohydrate diet, and regular physical exercise as tolerated, 150 minutes per week (30 min each day, 5 days per week, or 50 min 3 days per week). Keep blood sugar logs with fasting goal of 80-130 mg/dl, post prandial less than 180.  For Hypoglycemia: BS <60 and Hyperglycemia BS >400; contact the clinic ASAP. Annual eye exams and foot exams are recommended.  Hyperlipidemia associated with type 2 diabetes mellitus (HCC) -     Glucose (CBG) -     HgB A1c -     Lipid panel -     rosuvastatin (CRESTOR) 40 MG tablet; Take 1 tablet (40 mg total) by mouth daily at 6 PM. INSTRUCTIONS: Work on a low fat, heart healthy diet and participate in regular aerobic exercise program by working out at least 150 minutes per week. No fried foods. No junk foods, sodas, sugary drinks, unhealthy snacking, alcohol or  smoking.    Protein-calorie malnutrition, unspecified severity ([place]) Resolved.   Hemiparesis affecting right side as late effect of stroke (HCC) -     clopidogrel (PLAVIX) 75 MG tablet; Take 1 tablet (75 mg total) by mouth daily. -     sertraline (ZOLOFT) 25 MG tablet; Take 1 tablet (25 mg total) by mouth daily.  Polycythemia vera ([place]) Labs pending. CBC  Essential hypertension -     lisinopril-hydrochlorothiazide (PRINZIDE,ZESTORETIC) 20-25 MG tablet; Take 1 tablet by mouth daily. -     amLODipine (NORVASC) 5 MG tablet; Take 1 tablet (5 mg total) by mouth daily. Continue all antihypertensives as prescribed.  Remember to bring in your blood pressure log with you for your follow up appointment.  DASH/Mediterranean Diets are healthier choices for HTN.   Gastroesophageal reflux disease, esophagitis presence not specified -     pantoprazole (PROTONIX) 40 MG tablet; Take 1 tablet (40 mg total) by mouth daily at 6 (six) AM. INSTRUCTIONS: Avoid GERD Triggers: acidic, spicy or fried foods, caffeine, coffee, sodas,  alcohol and chocolate.   Patient has been counseled on age-appropriate routine health concerns for screening and prevention. These are reviewed and up-to-date. Referrals have been placed accordingly. Immunizations are  up-to-date or declined.    Subjective:   Chief Complaint  Patient presents with  . Shoulder Pain    Pt. stated his right shoulder pain and his back hurts.    HPI Eric Morton 53 y.o. male presents to office today for follow up of DM, HPL and HTN. He is accompanied by his sister today who also helps provide history. He is s/p  CVA 10-23-2016. He is doing much better. Still with mild dysarthria but able to communicate well verbally. He is ambulating however he is in his wheelchair today.   DM Type 2 Disease course has been worsening. There are no hypoglycemic symptoms. There are no hypoglycemic complications. There are diabetic complications. Risk factors for  coronary artery disease include family history, dyslipidemia, diabetes mellitus,  hypertension, sedentary lifestyle and stroke. Current diabetic treatment includes metformin 582m daily. Patient is not compliant with treatment all of the time and does not monitor his blood glucose at home. His sister reports spot checking for him but she has not been consistent with monitoring as well.   Weight is  stable. Patient follows a generally healthy diet. Meal planning includes excessive carbs and sweets!!!!! Eating biscuits, grits, and bacon for breakfast. Hamburgers, french fries, fried egg rolls. Patient has not seen a dietician. Patient is not compliant with exercise.   An ACE inhibitor/angiotensin II receptor blocker is being taken. Patient does not see a podiatrist. Eye exam is current and patient currently being treated for retinal detachment of the left eye. A1c has increased substantially. We had a long discussion regarding his dietary habits and lack of activity. He will need to make drastic changes in order to avoid another stroke, experience a heart attack or have diabetic complications including renal failure, amputation or blindness. He verbalized understanding.  His sister performs all of the cooking in the home and states to me that the patient will not eat unless she cooks things like biscuits, grits, fried foods and other unhealthy foods.  She states that it has become too stressful taking care of him with his diabetes worsening and his noncompliant behavior and she feels guilty for preparing the foods for him that she knows are unhealthy.  I have spoken with the case manager JOpal Morton and have requested that she speak to Eric Morton's sister regarding possible placement. Lab Results  Component Value Date   HGBA1C 13.2 (A) 07/14/2017   Lab Results  Component Value Date   HGBA1C 8.9 (A) 10/23/2016    Essential Hypertension Chronic. Well controlled. His sister reports normal readings at home although  he is slightly low here in the office today. He endorses medication compliance taking prinzide 20-232mdaily and amlodipine 25m48maily. Denies chest pain, shortness of breath, palpitations, lightheadedness, dizziness, headaches or BLE edema.  BP Readings from Last 3 Encounters:  07/14/17 97/60  01/02/17 140/78  01/04/17 140/78    Hyperlipidemia Patient presents for follow up to hyperlipidemia.  He is medication compliant. He is not diet compliant and denies skin xanthelasma or statin intolerance including myalgias.  Lab Results  Component Value Date   CHOL 195 10/23/2016   Lab Results  Component Value Date   HDL 64 10/23/2016   Lab Results  Component Value Date   LDLCALC 99 10/23/2016   Lab Results  Component Value Date   TRIG 161 (H) 10/23/2016   Lab Results  Component Value Date   CHOLHDL 3.0 10/23/2016   Review of Systems  Constitutional: Negative for fever, malaise/fatigue and weight  loss.  HENT: Negative.  Negative for nosebleeds.   Eyes: Positive for blurred vision. Negative for double vision and photophobia.  Respiratory: Negative.  Negative for cough and shortness of breath.   Cardiovascular: Negative.  Negative for chest pain, palpitations and leg swelling.  Gastrointestinal: Positive for heartburn and nausea. Negative for vomiting.  Genitourinary: Positive for frequency.  Musculoskeletal: Negative.  Negative for myalgias.  Neurological: Positive for sensory change and weakness. Negative for dizziness, focal weakness, seizures and headaches.  Psychiatric/Behavioral: Positive for memory loss. Negative for suicidal ideas. The patient is nervous/anxious.     Past Medical History:  Diagnosis Date  . 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 Anxiety   . Chronic kidney disease    "jsut a little damage"  . Glaucoma   . History of stomach ulcers    not confirmed, never underwent diagnostic endo or radiology, this manifested as epigastric distress.   [name]  Hx of adenomatous colonic polyps   . Hypercholesterolemia   . Hypertension   . Peripheral neuropathy   . Polycythemia vera ([place]) 2006  . Substance abuse ([place])   . Type II diabetes mellitus ([place])     Past Surgical History:  Procedure Laterality Date  . COLONOSCOPY WITH PROPOFOL N/A 04/28/2014   Procedure: COLONOSCOPY WITH PROPOFOL;  Surgeon: Gatha Mayer, MD;  Location: [facility];  Service: Endoscopy;  Laterality: N/A;  . IRRIGATION AND DEBRIDEMENT ABSCESS  09/26/2011   Procedure: IRRIGATION AND DEBRIDEMENT ABSCESS;  Surgeon: Imogene Burn. Georgette Dover, MD;  Location: [place] [facility];  Service: General;  Laterality: Left;  Incision and Drainage left breast abscess    Family History  Problem Relation Age of Onset  . Diabetes Mother   . Hypertension Mother     Social History Reviewed with no changes to be made today.   Outpatient Medications Prior to Visit  Medication Sig Dispense Refill  . aspirin (ASPIRIN CHILDRENS) 81 MG chewable tablet Chew 1 tablet (81 mg total) by mouth daily. 36 tablet 11  . folic acid (FOLVITE) 1 MG tablet TAKE 1 TABLET BY MOUTH DAILY 30 tablet 0  . Multiple Vitamin (MULTIVITAMIN WITH MINERALS) TABS tablet Take 1 tablet by mouth daily.    [name] amLODipine (NORVASC) 5 MG tablet TAKE 1 TABLET BY MOUTH DAILY 30 tablet 0  . Blood Glucose Monitoring Suppl (RELION CONFIRM GLUCOSE MONITOR) w/Device KIT Use as directed 1 kit 0  . clopidogrel (PLAVIX) 75 MG tablet TAKE 1 TABLET BY MOUTH DAILY 30 tablet 0  . diclofenac sodium (VOLTAREN) 1 % GEL Apply 2 g topically 4 (four) times daily. 100 g 1  . glucose blood (RELION CONFIRM/MICRO TEST) test strip Use as instructed 100 each 12  . glucose blood (TRUE METRIX BLOOD GLUCOSE TEST) test strip 1 each by Other route 3 (three) times daily. 100 each 12  . lisinopril-hydrochlorothiazide (PRINZIDE,ZESTORETIC) 20-25 MG tablet Take 1 tablet by mouth daily. 90 tablet 3  . metFORMIN (GLUCOPHAGE) 500 MG tablet Take 1 tablet (500 mg total) by mouth 2 (two)  times daily with a meal. 180 tablet 3  . TRUEPLUS LANCETS 28G MISC 1 each by Does not apply route 3 (three) times daily. 100 each 11  . Blood Glucose Monitoring Suppl (TRUE METRIX GO GLUCOSE METER) w/Device KIT USE AS DIRECTED  0  . gabapentin (NEURONTIN) 300 MG capsule Take 1 capsule (300 mg total) by mouth at bedtime. 90 capsule 0  . glipiZIDE (GLUCOTROL) 10 MG tablet Take 1 tablet (  10 mg total) by mouth daily before breakfast. 90 tablet 0  . pantoprazole (PROTONIX) 40 MG tablet Take 1 tablet (40 mg total) by mouth daily at 6 (six) AM. 90 tablet 0  . rosuvastatin (CRESTOR) 40 MG tablet Take 1 tablet (40 mg total) by mouth daily at 6 PM. 90 tablet 0  . sertraline (ZOLOFT) 25 MG tablet Take 1 tablet (25 mg total) by mouth daily. 90 tablet 0   No facility-administered medications prior to visit.     Allergies  Allergen Reactions  . Lipitor [Atorvastatin] Other (See Comments)    Sister states had to have Cardiac Surgery       Objective:    BP 97/60 (BP Location: Left Arm, Patient Position: Sitting, Cuff Size: Normal)   Pulse 87   Temp 97.8 F (36.6 C) (Oral)   Wt 156 lb (70.8 kg)   SpO2 99%   BMI 24.43 kg/m  Wt Readings from Last 3 Encounters:  07/14/17 156 lb (70.8 kg)  10/27/16 165 lb (74.8 kg)  10/23/16 163 lb 4.8 oz (74.1 kg)   Physical Exam  Constitutional: He is oriented to person, place, and time. He appears well-developed and well-nourished. He is cooperative.  HENT:  Head: Normocephalic and atraumatic.  Eyes: EOM are normal.  Neck: Normal range of motion.  Cardiovascular: Normal rate, regular rhythm and normal heart sounds. Exam reveals no gallop and no friction rub.  No murmur heard. Pulmonary/Chest: Effort normal and breath sounds normal. No tachypnea. No respiratory distress. He has no decreased breath sounds. He has no wheezes. He has no rhonchi. He has no rales. He exhibits no tenderness.  Abdominal: Bowel sounds are normal.  Musculoskeletal: Normal range of  motion. He exhibits no edema or deformity.  Neurological: He is alert and oriented to person, place, and time. Coordination abnormal.  Skin: Skin is warm and dry.  Psychiatric: He has a normal mood and affect. His behavior is normal. Judgment and thought content normal.  Nursing note and vitals reviewed.      Patient has been counseled extensively about nutrition and exercise as well as the importance of adherence with medications and regular follow-up. The patient was given clear instructions to go to ER or return to medical center if symptoms don't improve, worsen or new problems develop. The patient verbalized understanding.   Follow-up: Return in about 3 months (around 10/14/2017) for HTN/HPL/DM.   [name], FNP-BC [facility], [place]   07/14/2017, 5:37 PM

## 2017-07-14 NOTE — Telephone Encounter (Signed)
Met with patient and his sister when he was in the clinic today for his appointment with his provider.     His sister, Eric Morton,  spoke about the frustrations with caring for him at home specifically that he will not eat what is " good " for him and he does not follow through with managing his diabetes. He is not compliant with checking his blood sugars. Discussed meal planning at length and provided them with basic diabetic meal planning guide/plate portions. Reviewed his current diet and how to make changes slowly.  Described the possible complications if his diabetes is not managed.  He stated that he understood and is willing to make changes.  Eric Morton was able to describe proper diabetic meal preparation.  The options of care at home v. Facility were discussed. His sister, Eric Morton, said that she would like to work with him for another 30 days to see if he will be more compliant with his diet/diabetes management before proceeding with facility placement and he was in agreement.    He currently receives 2 hours/day PCS and [facility] is going to the home tomorrow to re-assess him for more hours.  He is already on the wait list for CAP services and Eric Morton stated that she is familiar with PACE and knows that he needs to be 53 to be eligible. She stated that overall she does not think that more help at home will solve the problem, again focusing on what he eats and how he manages his diabetes. She believes that he needs to make the changes within himself.   She was given a SCAT application to complete.

## 2017-07-15 ENCOUNTER — Inpatient Hospital Stay (HOSPITAL_COMMUNITY)
Admission: EM | Admit: 2017-07-15 | Discharge: 2017-07-21 | DRG: 683 | Disposition: A | Discharge status: Home / Self Care | Payer: Medicaid Other | Attending: Internal Medicine | Admitting: Internal Medicine

## 2017-07-15 ENCOUNTER — Telehealth: Payer: Self-pay | Admitting: Nurse Practitioner

## 2017-07-15 ENCOUNTER — Encounter (HOSPITAL_COMMUNITY): Payer: Self-pay

## 2017-07-15 ENCOUNTER — Other Ambulatory Visit: Payer: Self-pay

## 2017-07-15 DIAGNOSIS — N179 Acute kidney failure, unspecified: Principal | ICD-10-CM | POA: Diagnosis present

## 2017-07-15 DIAGNOSIS — R3129 Other microscopic hematuria: Secondary | ICD-10-CM | POA: Diagnosis present

## 2017-07-15 DIAGNOSIS — I129 Hypertensive chronic kidney disease with stage 1 through stage 4 chronic kidney disease, or unspecified chronic kidney disease: Secondary | ICD-10-CM | POA: Diagnosis present

## 2017-07-15 DIAGNOSIS — E1122 Type 2 diabetes mellitus with diabetic chronic kidney disease: Secondary | ICD-10-CM | POA: Diagnosis present

## 2017-07-15 DIAGNOSIS — R21 Rash and other nonspecific skin eruption: Secondary | ICD-10-CM | POA: Diagnosis not present

## 2017-07-15 DIAGNOSIS — Z87891 Personal history of nicotine dependence: Secondary | ICD-10-CM

## 2017-07-15 DIAGNOSIS — E876 Hypokalemia: Secondary | ICD-10-CM | POA: Diagnosis present

## 2017-07-15 DIAGNOSIS — I69922 Dysarthria following unspecified cerebrovascular disease: Secondary | ICD-10-CM

## 2017-07-15 DIAGNOSIS — I10 Essential (primary) hypertension: Secondary | ICD-10-CM | POA: Diagnosis present

## 2017-07-15 DIAGNOSIS — Z794 Long term (current) use of insulin: Secondary | ICD-10-CM

## 2017-07-15 DIAGNOSIS — K5900 Constipation, unspecified: Secondary | ICD-10-CM | POA: Diagnosis present

## 2017-07-15 DIAGNOSIS — N181 Chronic kidney disease, stage 1: Secondary | ICD-10-CM | POA: Diagnosis present

## 2017-07-15 DIAGNOSIS — E1142 Type 2 diabetes mellitus with diabetic polyneuropathy: Secondary | ICD-10-CM

## 2017-07-15 DIAGNOSIS — N289 Disorder of kidney and ureter, unspecified: Secondary | ICD-10-CM

## 2017-07-15 DIAGNOSIS — E7800 Pure hypercholesterolemia, unspecified: Secondary | ICD-10-CM | POA: Diagnosis present

## 2017-07-15 DIAGNOSIS — E1165 Type 2 diabetes mellitus with hyperglycemia: Secondary | ICD-10-CM | POA: Diagnosis present

## 2017-07-15 DIAGNOSIS — Z7902 Long term (current) use of antithrombotics/antiplatelets: Secondary | ICD-10-CM

## 2017-07-15 DIAGNOSIS — E119 Type 2 diabetes mellitus without complications: Secondary | ICD-10-CM

## 2017-07-15 DIAGNOSIS — R112 Nausea with vomiting, unspecified: Secondary | ICD-10-CM

## 2017-07-15 DIAGNOSIS — H409 Unspecified glaucoma: Secondary | ICD-10-CM | POA: Diagnosis present

## 2017-07-15 DIAGNOSIS — Z7982 Long term (current) use of aspirin: Secondary | ICD-10-CM

## 2017-07-15 DIAGNOSIS — Z8673 Personal history of transient ischemic attack (TIA), and cerebral infarction without residual deficits: Secondary | ICD-10-CM

## 2017-07-15 DIAGNOSIS — I69959 Hemiplegia and hemiparesis following unspecified cerebrovascular disease affecting unspecified side: Secondary | ICD-10-CM

## 2017-07-15 DIAGNOSIS — D638 Anemia in other chronic diseases classified elsewhere: Secondary | ICD-10-CM | POA: Diagnosis present

## 2017-07-15 DIAGNOSIS — Z79899 Other long term (current) drug therapy: Secondary | ICD-10-CM

## 2017-07-15 HISTORY — DX: Cerebral infarction, unspecified: I63.9

## 2017-07-15 HISTORY — DX: Disorder of kidney and ureter, unspecified: N28.9

## 2017-07-15 LAB — BASIC METABOLIC PANEL
Anion gap: 14 (ref 5–15)
BUN: 52 mg/dL — AB (ref 6–20)
CHLORIDE: 106 mmol/L (ref 101–111)
CO2: 19 mmol/L — AB (ref 22–32)
CREATININE: 4.21 mg/dL — AB (ref 0.61–1.24)
Calcium: 9.2 mg/dL (ref 8.9–10.3)
GFR calc non Af Amer: 15 mL/min — ABNORMAL LOW (ref >60)
GFR, EST AFRICAN AMERICAN: 17 mL/min — AB (ref >60)
GLUCOSE: 200 mg/dL — AB (ref 65–99)
Potassium: 3.3 mmol/L — ABNORMAL LOW (ref 3.5–5.1)
Sodium: 139 mmol/L (ref 135–145)

## 2017-07-15 LAB — CBC
HCT: 33 % — ABNORMAL LOW (ref 39.0–52.0)
HEMATOCRIT: 32.9 % — AB (ref 37.5–51.0)
Hemoglobin: 10.8 g/dL — ABNORMAL LOW (ref 13.0–17.7)
Hemoglobin: 10.7 g/dL — ABNORMAL LOW (ref 13.0–17.0)
MCH: 27.3 pg (ref 26.6–33.0)
MCH: 26.7 pg (ref 26.0–34.0)
MCHC: 32.8 g/dL (ref 31.5–35.7)
MCHC: 32.4 g/dL (ref 30.0–36.0)
MCV: 83 fL (ref 79–97)
MCV: 82.3 fL (ref 78.0–100.0)
PLATELETS: 185 10*3/uL (ref 150–450)
Platelets: 196 10*3/uL (ref 150–400)
RBC: 3.96 x10E6/uL — AB (ref 4.14–5.80)
RBC: 4.01 MIL/uL — ABNORMAL LOW (ref 4.22–5.81)
RDW: 13.9 % (ref 12.3–15.4)
RDW: 12.3 % (ref 11.5–15.5)
WBC: 4.6 10*3/uL (ref 3.4–10.8)
WBC: 6.3 10*3/uL (ref 4.0–10.5)

## 2017-07-15 LAB — CMP14+EGFR
ALT: 13 IU/L (ref 0–44)
AST: 14 IU/L (ref 0–40)
Albumin/Globulin Ratio: 1.2 (ref 1.2–2.2)
Albumin: 4 g/dL (ref 3.5–5.5)
Alkaline Phosphatase: 117 IU/L (ref 39–117)
BUN/Creatinine Ratio: 12 (ref 9–20)
BUN: 52 mg/dL — ABNORMAL HIGH (ref 6–24)
Bilirubin Total: 0.3 mg/dL (ref 0.0–1.2)
CALCIUM: 8.5 mg/dL — AB (ref 8.7–10.2)
CO2: 18 mmol/L — AB (ref 20–29)
CREATININE: 4.45 mg/dL — AB (ref 0.76–1.27)
Chloride: 98 mmol/L (ref 96–106)
GFR, EST AFRICAN AMERICAN: 16 mL/min/{1.73_m2} — AB (ref >59)
GFR, EST NON AFRICAN AMERICAN: 14 mL/min/{1.73_m2} — AB (ref >59)
Globulin, Total: 3.4 g/dL (ref 1.5–4.5)
Glucose: 439 mg/dL — ABNORMAL HIGH (ref 65–99)
POTASSIUM: 3 mmol/L — AB (ref 3.5–5.2)
Sodium: 135 mmol/L (ref 134–144)
TOTAL PROTEIN: 7.4 g/dL (ref 6.0–8.5)

## 2017-07-15 LAB — LIPID PANEL
CHOL/HDL RATIO: 4.2 ratio (ref 0.0–5.0)
Cholesterol, Total: 146 mg/dL (ref 100–199)
HDL: 35 mg/dL — AB (ref >39)
LDL CALC: 62 mg/dL (ref 0–99)
Triglycerides: 243 mg/dL — ABNORMAL HIGH (ref 0–149)
VLDL CHOLESTEROL CAL: 49 mg/dL — AB (ref 5–40)

## 2017-07-15 LAB — CBG MONITORING, ED: GLUCOSE-CAPILLARY: 197 mg/dL — AB (ref 65–99)

## 2017-07-15 MED ORDER — SODIUM CHLORIDE 0.9 % IV BOLUS
1000.0000 mL | Freq: Once | INTRAVENOUS | Status: AC
  Administered 2017-07-15: 1000 mL via INTRAVENOUS

## 2017-07-15 MED ORDER — IOPAMIDOL (ISOVUE-300) INJECTION 61%
INTRAVENOUS | Status: AC
Start: 2017-07-15 — End: 2017-07-16
  Filled 2017-07-15: qty 30

## 2017-07-15 NOTE — ED Triage Notes (Addendum)
Pt endorses being sent by pcp for elevated creatinine and elevated glucose 480 and A1C greater than 13. Pt has hx of stroke and has slurred speech at baseline. Endorses vomiting and lower back pain

## 2017-07-15 NOTE — Telephone Encounter (Signed)
I have contacted Mr. Eric Morton sister via phone and instructed her that Mr. Eric Morton needs to go to the emergency room due to renal function, elevated glucose. She also states he has been vomiting all day and complaining of abdominal pain.

## 2017-07-15 NOTE — ED Provider Notes (Signed)
[facility] EMERGENCY DEPARTMENT Provider Note   CSN: [id number] Arrival date & time: 07/15/17  2019     History   Chief Complaint Chief Complaint  Patient presents with  . Hyperglycemia  . Abnormal Lab    HPI Eric Morton is a 52 y.o. male.  52 y/o male with hx of CVA, DM, HTN, HLD, CKD, polycythemia vera presents to the ED at the advise of his PCP. He was seen in the office yesterday and was found to have uncontrolled diabetes with CBG of 400's in the office. Hgb A1C had increased from 8.3 up to 13. Patient had his metformin increased to 1045m BID yesterday. He was also started on insulin and glipizide, per sister.  He received a call today that his creatinine had also increased up to 4.45.  This was previously normal in November 2018.  Sister states that the patient has had intermittent abdominal pain over the past 2 days.  This was associated with anorexia at first, but has progressed to nausea with sporadic vomiting.  He has been unable to tolerate adequate fluids PO today.  Currently, the patient denies abdominal pain.  He has no present complaint of nausea.  Sister denies any recent fever.  He had a bowel movement around 1800 tonight which was normal.  No melena or hematochezia.  Last void was also around 1800 tonight.  The patient denies dysuria or hematuria at this time.  No history of abdominal surgeries.  The history is provided by the patient. No language interpreter was used.  Hyperglycemia  Abnormal Lab    Past Medical History:  Diagnosis Date  . 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 Anxiety   . Chronic kidney disease    "jsut a little damage"  . Glaucoma   . History of stomach ulcers    not confirmed, never underwent diagnostic endo or radiology, this manifested as epigastric distress.   .Marland KitchenHx of adenomatous colonic polyps   . Hypercholesterolemia   . Hypertension   . Peripheral neuropathy   . Polycythemia vera ([facility] 2006    . Stroke ([facility]    slurred speech  . Substance abuse ([facility]   . Type II diabetes mellitus ([facility]     Patient Active Problem List   Diagnosis Date Noted  . Acute blood loss anemia   . Dysphagia, post-stroke   . Hemiparesis affecting right side as late effect of stroke ([facility] 10/29/2016  . Dysarthria due to recent stroke   . Left-sided weakness 10/23/2016  . Aphasia   . Hyperlipidemia associated with type 2 diabetes mellitus ([facility] 07/27/2015  . Cerebrovascular accident (CVA) due to occlusion of cerebral artery ([facility]   . Polycythemia vera ([facility] [place] 03/21/2015  . Hx of adenomatous colonic polyps   . Dehydration   . Diarrhea   . Protein-calorie malnutrition ([facility]   . Cellulitis of breast of male 09/25/2011  . Essential hypertension 09/25/2011  . DM (diabetes mellitus) ([facility] 09/25/2011  . Leukopenia 09/25/2011  . Alcohol abuse 09/25/2011  . Cocaine abuse ([facility] 09/25/2011  . Tobacco use 09/25/2011  . Neuropathy 09/25/2011    Past Surgical History:  Procedure Laterality Date  . COLONOSCOPY WITH PROPOFOL N/A 04/28/2014   Procedure: COLONOSCOPY WITH PROPOFOL;  Surgeon: CGatha Mayer MD;  [place] [facility]  Service: Endoscopy;  Laterality: N/A;  . IRRIGATION AND DEBRIDEMENT ABSCESS  09/26/2011   Procedure: IRRIGATION AND DEBRIDEMENT ABSCESS;  Surgeon: MImogene Burn Tsuei, MD;  Location: [facility] OR;  Service: General;  Laterality: Left;  Incision and Drainage left breast abscess        Home Medications    Prior to Admission medications   Medication Sig Start Date End Date Taking? Authorizing Provider  amLODipine (NORVASC) 5 MG tablet Take 1 tablet (5 mg total) by mouth daily. 07/14/17   [name], NP  aspirin (ASPIRIN CHILDRENS) 81 MG chewable tablet Chew 1 tablet (81 mg total) by mouth daily. 11/28/16 11/28/17  Angiulli, Lavon Paganini, PA-C  Blood Glucose Monitoring Suppl (TRUE METRIX GO GLUCOSE METER) w/Device KIT USE AS DIRECTED 12/12/16   [provider]  clopidogrel (PLAVIX)  75 MG tablet Take 1 tablet (75 mg total) by mouth daily. 07/14/17   [name], NP  folic acid (FOLVITE) 1 MG tablet TAKE 1 TABLET BY MOUTH DAILY 06/22/17   [name], NP  gabapentin (NEURONTIN) 300 MG capsule Take 1 capsule (300 mg total) by mouth 3 (three) times daily. 07/14/17 08/13/17  [name], NP  glipiZIDE (GLUCOTROL) 10 MG tablet Take 1 tablet (10 mg total) by mouth daily before breakfast. 07/14/17 08/13/17  [name], NP  glucose blood (TRUE METRIX BLOOD GLUCOSE TEST) test strip 1 each by Other route 3 (three) times daily. 07/14/17   [name], NP  Insulin Glargine (LANTUS) 100 UNIT/ML Solostar Pen Inject 20 Units into the skin daily at 10 pm. 07/14/17 08/13/17  [name], NP  Insulin Pen Needle (B-D UF III MINI PEN NEEDLES) 31G X 5 MM MISC Use as instructed 07/14/17   [name], NP  lisinopril-hydrochlorothiazide (PRINZIDE,ZESTORETIC) 20-25 MG tablet Take 1 tablet by mouth daily. 07/14/17   [name], NP  metFORMIN (GLUCOPHAGE) 1000 MG tablet Take 1 tablet (1,000 mg total) by mouth 2 (two) times daily with a meal. 07/14/17 08/13/17  [name], NP  Multiple Vitamin (MULTIVITAMIN WITH MINERALS) TABS tablet Take 1 tablet by mouth daily. 11/29/16   Angiulli, Lavon Paganini, PA-C  pantoprazole (PROTONIX) 40 MG tablet Take 1 tablet (40 mg total) by mouth daily at 6 (six) AM. 07/14/17 10/12/17  [name], NP  rosuvastatin (CRESTOR) 40 MG tablet Take 1 tablet (40 mg total) by mouth daily at 6 PM. 07/14/17 10/12/17  [name], NP  sertraline (ZOLOFT) 25 MG tablet Take 1 tablet (25 mg total) by mouth daily. 07/14/17 10/12/17  [name], NP  TRUEPLUS LANCETS 28G MISC 1 each by Does not apply route 3 (three) times daily. 07/14/17   [name], NP    Family History Family History  Problem Relation Age of Onset  . Diabetes Mother   . Hypertension Mother     Social History Social History   Tobacco Use  . Smoking status: Former  Smoker    Packs/day: 0.25    Years: 32.00    Pack years: 8.00    Types: Cigarettes  . Smokeless tobacco: Never Used  Substance Use Topics  . Alcohol use: Yes    Comment: 3/14 quit drinking 5 days ago and then drank last nite  . Drug use: Yes    Types: Marijuana, "Crack" cocaine    Comment: snorts and smokes cocaine ocassionally      Allergies   Lipitor [atorvastatin]   Review of Systems Review of Systems Ten systems reviewed and are negative for acute change, except as noted in the HPI.    Physical Exam Updated Vital Signs BP (!) 146/89  Pulse 74   Temp 98.4 F (36.9 C) (Oral)   Resp 18   Ht '5\' 7"'  (1.702 m)   Wt 74.8 kg (165 lb)   SpO2 100%   BMI 25.84 kg/m   Physical Exam  Constitutional: He is oriented to person, place, and time. He appears well-developed and well-nourished. No distress.  Patient alert, nontoxic, in NAD  HENT:  Head: Normocephalic and atraumatic.  Eyes: Conjunctivae and EOM are normal. No scleral icterus.  Neck: Normal range of motion.  Cardiovascular: Normal rate, regular rhythm and intact distal pulses.  Pulmonary/Chest: Effort normal. No stridor. No respiratory distress.  Respirations even and unlabored. Lungs CTAB.  Abdominal: Soft. He exhibits no mass. There is no guarding.  Abdomen soft with mild RLQ TTP. No rebound or guarding. No palpable masses.  Musculoskeletal: Normal range of motion.  Neurological: He is alert and oriented to person, place, and time. He exhibits normal muscle tone. Coordination normal.  GCS 15. Dysarthric speech 2/2 prior CVA. Answers questions appropriately.  Skin: Skin is warm and dry. No rash noted. He is not diaphoretic. No erythema. No pallor.  Psychiatric: He has a normal mood and affect. His behavior is normal.  Nursing note and vitals reviewed.    ED Treatments / Results  Labs (all labs ordered are listed, but only abnormal results are displayed) Labs Reviewed  BASIC METABOLIC PANEL - Abnormal;  Notable for the following components:      Result Value   Potassium 3.3 (*)    CO2 19 (*)    Glucose, Bld 200 (*)    BUN 52 (*)    Creatinine, Ser 4.21 (*)    GFR calc non Af Amer 15 (*)    GFR calc Af Amer 17 (*)    All other components within normal limits  CBC - Abnormal; Notable for the following components:   RBC 4.01 (*)    Hemoglobin 10.7 (*)    HCT 33.0 (*)    All other components within normal limits  URINALYSIS, ROUTINE W REFLEX MICROSCOPIC - Abnormal; Notable for the following components:   Glucose, UA 50 (*)    Hgb urine dipstick MODERATE (*)    Protein, ur 100 (*)    Bacteria, UA RARE (*)    All other components within normal limits  CBG MONITORING, ED - Abnormal; Notable for the following components:   Glucose-Capillary 197 (*)    All other components within normal limits    EKG None  Radiology Ct Abdomen Pelvis Wo Contrast  Result Date: 07/16/2017 CLINICAL DATA:  52 y/o  M; nausea, vomiting, and lower back pain. EXAM: CT ABDOMEN AND PELVIS WITHOUT CONTRAST TECHNIQUE: Multidetector CT imaging of the abdomen and pelvis was performed following the standard protocol without IV contrast. COMPARISON:  04/25/2014 CT abdomen and pelvis FINDINGS: Lower chest: No acute abnormality. Hepatobiliary: No focal liver abnormality is seen. No gallstones, gallbladder wall thickening, or biliary dilatation. Pancreas: Unremarkable. No pancreatic ductal dilatation or surrounding inflammatory changes. Spleen: Normal in size without focal abnormality. Adrenals/Urinary Tract: Adrenal glands are unremarkable. Kidneys are normal, without renal calculi, focal lesion, or hydronephrosis. Bladder is unremarkable. Stomach/Bowel: Rugal thickening of the nondistended proximal gastric body and cardia. Appendix appears normal. No evidence of bowel wall thickening, distention, or inflammatory changes. Vascular/Lymphatic: Aortic atherosclerosis. No enlarged abdominal or pelvic lymph nodes. Reproductive:  Prostate is unremarkable. Other: No abdominal wall hernia or abnormality. No abdominopelvic ascites. Musculoskeletal: No fracture is seen. IMPRESSION: Rugal thickening of the nondistended proximal  gastric body and cardia may represent gastritis. No findings of perforation or abscess. Aortic atherosclerosis. Electronically Signed   By: Kristine Garbe M.D.   On: 07/16/2017 01:58    Procedures Procedures (including critical care time)  Medications Ordered in ED Medications  iopamidol (ISOVUE-300) 61 % injection (has no administration in time range)  sodium chloride 0.9 % bolus 1,000 mL (1,000 mLs Intravenous New Bag/Given 07/16/17 0151)  sodium chloride 0.9 % bolus 1,000 mL (0 mLs Intravenous Stopped 07/16/17 0041)  iopamidol (ISOVUE-300) 61 % injection 30 mL (30 mLs Oral Contrast Given 07/15/17 1130)     Initial Impression / Assessment and Plan / ED Course  I have reviewed the triage vital signs and the nursing notes.  Pertinent labs & imaging results that were available during my care of the patient were reviewed by me and considered in my medical decision making (see chart for details).      43:80 PM 52 year old male presents to the emergency department at the advice of his primary care doctor.  He is found to be in acute renal failure with increase in his creatinine to 4.2.  Creatinine previously normal in November 2018.  Worsening kidney function may be multifactorial as the patient is on lisinopril.  He has also had poorly controlled diabetes, ongoing for the past few months.  His hemoglobin A1c has apparently increased from 8 up to 13.  Sister reporting increased anorexia with nausea and vomiting over the past 2 days.  Dehydration could be contributing to kidney function worsening as well.  In light of abdominal pain, will obtain CT scan with oral contrast.  He has no signs of acute surgical abdomen on his exam.  No fevers or leukocytosis, making infectious etiology less  likely.  He will require admission for trending of his creatinine and ongoing hydration.  2:05 AM CT scan with findings possibly representing gastritis.  No other acute process in the abdomen or pelvis.  We will proceed with admission for management of acute renal failure.  2:24 AM Case discussed with Dr. Myna Hidalgo of [facility] who will evaluate the patient in the ED.   Final Clinical Impressions(s) / ED Diagnoses   Final diagnoses:  Acute renal failure, unspecified acute renal failure type ([place])  Non-intractable vomiting with nausea, unspecified vomiting type    ED Discharge Orders    None       Antonietta Breach, PA-C 07/16/17 0230    Davonna Belling, MD 07/17/17 [phone]

## 2017-07-15 NOTE — ED Provider Notes (Signed)
Patient placed in Quick Look pathway, seen and evaluated   Chief Complaint:   HPI:   52 year old male presents with abdominal pain, N/V which has been going on for 2 days. He followed up with his doctor yesterday and his blood sugar was 439, SCr was 4.45 (was previously normal). He has not been following a diabetic diet (A1c was 13). He denies fever or diarrhea. He is not on insulin. He is with his sister today who is POA.  ROS: +abdominal pain, N/V  Physical Exam:   Gen: No distress  Neuro: Awake and Alert  Skin: Warm    Focused Exam: Heart: Tachycardic and regular rhythm    Lungs: CTA    Abdomen: Soft and generally tender   Initiation of care has begun. The patient has been counseled on the process, plan, and necessity for staying for the completion/evaluation, and the remainder of the medical screening examination    Recardo Evangelist, PA-C 07/15/17 2044    Quintella Reichert, MD 07/17/17 [phone]

## 2017-07-16 ENCOUNTER — Encounter (HOSPITAL_COMMUNITY): Payer: Self-pay | Admitting: Family Medicine

## 2017-07-16 ENCOUNTER — Emergency Department (HOSPITAL_COMMUNITY): Payer: Medicaid Other

## 2017-07-16 ENCOUNTER — Telehealth (INDEPENDENT_AMBULATORY_CARE_PROVIDER_SITE_OTHER): Payer: Self-pay

## 2017-07-16 ENCOUNTER — Other Ambulatory Visit: Payer: Self-pay

## 2017-07-16 DIAGNOSIS — E1159 Type 2 diabetes mellitus with other circulatory complications: Secondary | ICD-10-CM | POA: Diagnosis not present

## 2017-07-16 DIAGNOSIS — N179 Acute kidney failure, unspecified: Secondary | ICD-10-CM | POA: Diagnosis not present

## 2017-07-16 DIAGNOSIS — N289 Disorder of kidney and ureter, unspecified: Secondary | ICD-10-CM

## 2017-07-16 DIAGNOSIS — Z7982 Long term (current) use of aspirin: Secondary | ICD-10-CM | POA: Diagnosis not present

## 2017-07-16 DIAGNOSIS — D649 Anemia, unspecified: Secondary | ICD-10-CM | POA: Diagnosis not present

## 2017-07-16 DIAGNOSIS — I10 Essential (primary) hypertension: Secondary | ICD-10-CM

## 2017-07-16 DIAGNOSIS — Z8673 Personal history of transient ischemic attack (TIA), and cerebral infarction without residual deficits: Secondary | ICD-10-CM

## 2017-07-16 DIAGNOSIS — Z7902 Long term (current) use of antithrombotics/antiplatelets: Secondary | ICD-10-CM | POA: Diagnosis not present

## 2017-07-16 DIAGNOSIS — N181 Chronic kidney disease, stage 1: Secondary | ICD-10-CM | POA: Diagnosis present

## 2017-07-16 DIAGNOSIS — K5900 Constipation, unspecified: Secondary | ICD-10-CM | POA: Diagnosis present

## 2017-07-16 DIAGNOSIS — Z79899 Other long term (current) drug therapy: Secondary | ICD-10-CM | POA: Diagnosis not present

## 2017-07-16 DIAGNOSIS — E876 Hypokalemia: Secondary | ICD-10-CM

## 2017-07-16 DIAGNOSIS — Z794 Long term (current) use of insulin: Secondary | ICD-10-CM | POA: Diagnosis not present

## 2017-07-16 DIAGNOSIS — D638 Anemia in other chronic diseases classified elsewhere: Secondary | ICD-10-CM | POA: Diagnosis present

## 2017-07-16 DIAGNOSIS — I69922 Dysarthria following unspecified cerebrovascular disease: Secondary | ICD-10-CM | POA: Diagnosis not present

## 2017-07-16 DIAGNOSIS — I129 Hypertensive chronic kidney disease with stage 1 through stage 4 chronic kidney disease, or unspecified chronic kidney disease: Secondary | ICD-10-CM | POA: Diagnosis present

## 2017-07-16 DIAGNOSIS — E1142 Type 2 diabetes mellitus with diabetic polyneuropathy: Secondary | ICD-10-CM | POA: Diagnosis present

## 2017-07-16 DIAGNOSIS — H409 Unspecified glaucoma: Secondary | ICD-10-CM | POA: Diagnosis present

## 2017-07-16 DIAGNOSIS — Z87891 Personal history of nicotine dependence: Secondary | ICD-10-CM | POA: Diagnosis not present

## 2017-07-16 DIAGNOSIS — I69959 Hemiplegia and hemiparesis following unspecified cerebrovascular disease affecting unspecified side: Secondary | ICD-10-CM | POA: Diagnosis not present

## 2017-07-16 DIAGNOSIS — E1165 Type 2 diabetes mellitus with hyperglycemia: Secondary | ICD-10-CM | POA: Diagnosis present

## 2017-07-16 DIAGNOSIS — R21 Rash and other nonspecific skin eruption: Secondary | ICD-10-CM | POA: Diagnosis not present

## 2017-07-16 DIAGNOSIS — R3129 Other microscopic hematuria: Secondary | ICD-10-CM | POA: Diagnosis present

## 2017-07-16 DIAGNOSIS — E1122 Type 2 diabetes mellitus with diabetic chronic kidney disease: Secondary | ICD-10-CM | POA: Diagnosis present

## 2017-07-16 DIAGNOSIS — E7800 Pure hypercholesterolemia, unspecified: Secondary | ICD-10-CM | POA: Diagnosis present

## 2017-07-16 HISTORY — DX: Disorder of kidney and ureter, unspecified: N28.9

## 2017-07-16 LAB — URINALYSIS, ROUTINE W REFLEX MICROSCOPIC
Bilirubin Urine: NEGATIVE
Glucose, UA: 50 mg/dL — AB
KETONES UR: NEGATIVE mg/dL
Leukocytes, UA: NEGATIVE
Nitrite: NEGATIVE
PROTEIN: 100 mg/dL — AB
Specific Gravity, Urine: 1.012 (ref 1.005–1.030)
pH: 5 (ref 5.0–8.0)

## 2017-07-16 LAB — BASIC METABOLIC PANEL
Anion gap: 12 (ref 5–15)
BUN: 49 mg/dL — AB (ref 6–20)
CO2: 19 mmol/L — AB (ref 22–32)
Calcium: 8.4 mg/dL — ABNORMAL LOW (ref 8.9–10.3)
Chloride: 109 mmol/L (ref 101–111)
Creatinine, Ser: 3.75 mg/dL — ABNORMAL HIGH (ref 0.61–1.24)
GFR calc Af Amer: 20 mL/min — ABNORMAL LOW (ref >60)
GFR, EST NON AFRICAN AMERICAN: 17 mL/min — AB (ref >60)
GLUCOSE: 167 mg/dL — AB (ref 65–99)
POTASSIUM: 3 mmol/L — AB (ref 3.5–5.1)
Sodium: 140 mmol/L (ref 135–145)

## 2017-07-16 LAB — IRON AND TIBC
IRON: 78 ug/dL (ref 45–182)
SATURATION RATIOS: 29 % (ref 17.9–39.5)
TIBC: 266 ug/dL (ref 250–450)
UIBC: 188 ug/dL

## 2017-07-16 LAB — RETICULOCYTES
RBC.: 3.75 MIL/uL — ABNORMAL LOW (ref 4.22–5.81)
RETIC CT PCT: 0.8 % (ref 0.4–3.1)
Retic Count, Absolute: 30 10*3/uL (ref 19.0–186.0)

## 2017-07-16 LAB — SODIUM, URINE, RANDOM: SODIUM UR: 58 mmol/L

## 2017-07-16 LAB — VITAMIN B12: Vitamin B-12: 572 pg/mL (ref 180–914)

## 2017-07-16 LAB — CREATININE, URINE, RANDOM: Creatinine, Urine: 114.47 mg/dL

## 2017-07-16 LAB — GLUCOSE, CAPILLARY
GLUCOSE-CAPILLARY: 132 mg/dL — AB (ref 65–99)
GLUCOSE-CAPILLARY: 130 mg/dL — AB (ref 65–99)
GLUCOSE-CAPILLARY: 238 mg/dL — AB (ref 65–99)
Glucose-Capillary: 211 mg/dL — ABNORMAL HIGH (ref 65–99)
Glucose-Capillary: 245 mg/dL — ABNORMAL HIGH (ref 65–99)

## 2017-07-16 LAB — MAGNESIUM: MAGNESIUM: 1.1 mg/dL — AB (ref 1.7–2.4)

## 2017-07-16 LAB — FERRITIN: FERRITIN: 235 ng/mL (ref 24–336)

## 2017-07-16 LAB — FOLATE: Folate: 54.6 ng/mL (ref >5.9)

## 2017-07-16 MED ORDER — POTASSIUM CHLORIDE CRYS ER 20 MEQ PO TBCR
20.0000 meq | EXTENDED_RELEASE_TABLET | Freq: Once | ORAL | Status: AC
  Administered 2017-07-16: 20 meq via ORAL
  Filled 2017-07-16: qty 1

## 2017-07-16 MED ORDER — GABAPENTIN 100 MG PO CAPS
100.0000 mg | ORAL_CAPSULE | Freq: Three times a day (TID) | ORAL | Status: DC
  Administered 2017-07-16 – 2017-07-21 (×17): 100 mg via ORAL
  Filled 2017-07-16 (×17): qty 1

## 2017-07-16 MED ORDER — SODIUM CHLORIDE 0.9 % IV BOLUS
1000.0000 mL | Freq: Once | INTRAVENOUS | Status: AC
  Administered 2017-07-16: 1000 mL via INTRAVENOUS

## 2017-07-16 MED ORDER — ONDANSETRON HCL 4 MG PO TABS: 4.0000 mg | ORAL_TABLET | Freq: Four times a day (QID) | ORAL | Status: DC | PRN

## 2017-07-16 MED ORDER — INSULIN GLARGINE 100 UNIT/ML ~~LOC~~ SOLN
10.0000 [IU] | Freq: Every day | SUBCUTANEOUS | Status: DC
  Administered 2017-07-16: 10 [IU] via SUBCUTANEOUS
  Filled 2017-07-16: qty 0.1

## 2017-07-16 MED ORDER — CLOPIDOGREL BISULFATE 75 MG PO TABS
75.0000 mg | ORAL_TABLET | Freq: Every day | ORAL | Status: DC
  Administered 2017-07-16 – 2017-07-21 (×6): 75 mg via ORAL
  Filled 2017-07-16 (×6): qty 1

## 2017-07-16 MED ORDER — PANTOPRAZOLE SODIUM 40 MG PO TBEC
40.0000 mg | DELAYED_RELEASE_TABLET | Freq: Every day | ORAL | Status: DC
  Administered 2017-07-16 – 2017-07-21 (×6): 40 mg via ORAL
  Filled 2017-07-16 (×6): qty 1

## 2017-07-16 MED ORDER — ADULT MULTIVITAMIN W/MINERALS CH
1.0000 | ORAL_TABLET | Freq: Every day | ORAL | Status: DC
  Administered 2017-07-16 – 2017-07-21 (×6): 1 via ORAL
  Filled 2017-07-16 (×5): qty 1

## 2017-07-16 MED ORDER — SODIUM CHLORIDE 0.9 % IV SOLN
INTRAVENOUS | Status: AC
  Administered 2017-07-16: 17:00:00 via INTRAVENOUS

## 2017-07-16 MED ORDER — SERTRALINE HCL 25 MG PO TABS
25.0000 mg | ORAL_TABLET | Freq: Every day | ORAL | Status: DC
  Administered 2017-07-16 – 2017-07-21 (×6): 25 mg via ORAL
  Filled 2017-07-16 (×6): qty 1

## 2017-07-16 MED ORDER — HYDROCODONE-ACETAMINOPHEN 5-325 MG PO TABS
1.0000 | ORAL_TABLET | ORAL | Status: DC | PRN
  Administered 2017-07-16 – 2017-07-17 (×4): 2 via ORAL
  Filled 2017-07-16 (×5): qty 2

## 2017-07-16 MED ORDER — AMLODIPINE BESYLATE 5 MG PO TABS
5.0000 mg | ORAL_TABLET | Freq: Every day | ORAL | Status: DC
  Administered 2017-07-16 – 2017-07-21 (×6): 5 mg via ORAL
  Filled 2017-07-16 (×6): qty 1

## 2017-07-16 MED ORDER — FAMOTIDINE IN NACL 20-0.9 MG/50ML-% IV SOLN: 20.0000 mg | Freq: Two times a day (BID) | INTRAVENOUS | Status: DC

## 2017-07-16 MED ORDER — ONDANSETRON HCL 4 MG/2ML IJ SOLN
4.0000 mg | Freq: Four times a day (QID) | INTRAMUSCULAR | Status: DC | PRN
  Administered 2017-07-17: 4 mg via INTRAVENOUS
  Filled 2017-07-16: qty 2

## 2017-07-16 MED ORDER — INSULIN ASPART 100 UNIT/ML ~~LOC~~ SOLN
0.0000 [IU] | Freq: Three times a day (TID) | SUBCUTANEOUS | Status: DC
  Administered 2017-07-16: 1 [IU] via SUBCUTANEOUS
  Administered 2017-07-16: 3 [IU] via SUBCUTANEOUS
  Administered 2017-07-16: 1 [IU] via SUBCUTANEOUS
  Administered 2017-07-17 (×2): 2 [IU] via SUBCUTANEOUS
  Administered 2017-07-17: 3 [IU] via SUBCUTANEOUS
  Administered 2017-07-18: 2 [IU] via SUBCUTANEOUS
  Administered 2017-07-18: 5 [IU] via SUBCUTANEOUS
  Administered 2017-07-19: 2 [IU] via SUBCUTANEOUS
  Administered 2017-07-19 – 2017-07-20 (×2): 3 [IU] via SUBCUTANEOUS
  Administered 2017-07-20: 1 [IU] via SUBCUTANEOUS
  Administered 2017-07-21: 2 [IU] via SUBCUTANEOUS
  Administered 2017-07-21: 1 [IU] via SUBCUTANEOUS

## 2017-07-16 MED ORDER — POTASSIUM CHLORIDE CRYS ER 20 MEQ PO TBCR
40.0000 meq | EXTENDED_RELEASE_TABLET | Freq: Two times a day (BID) | ORAL | Status: AC
  Administered 2017-07-16 (×2): 40 meq via ORAL
  Filled 2017-07-16 (×3): qty 2

## 2017-07-16 MED ORDER — SENNOSIDES-DOCUSATE SODIUM 8.6-50 MG PO TABS: 1.0000 | ORAL_TABLET | Freq: Every evening | ORAL | Status: DC | PRN

## 2017-07-16 MED ORDER — SODIUM CHLORIDE 0.9 % IV SOLN
INTRAVENOUS | Status: AC
  Administered 2017-07-16 (×2): via INTRAVENOUS

## 2017-07-16 MED ORDER — ASPIRIN 81 MG PO CHEW
81.0000 mg | CHEWABLE_TABLET | Freq: Every day | ORAL | Status: DC
  Administered 2017-07-16 – 2017-07-21 (×6): 81 mg via ORAL
  Filled 2017-07-16 (×6): qty 1

## 2017-07-16 MED ORDER — ROSUVASTATIN CALCIUM 20 MG PO TABS
40.0000 mg | ORAL_TABLET | Freq: Every day | ORAL | Status: DC
  Administered 2017-07-16 – 2017-07-20 (×5): 40 mg via ORAL
  Filled 2017-07-16 (×5): qty 2

## 2017-07-16 MED ORDER — FAMOTIDINE IN NACL 20-0.9 MG/50ML-% IV SOLN
20.0000 mg | Freq: Once | INTRAVENOUS | Status: AC
  Administered 2017-07-16: 20 mg via INTRAVENOUS
  Filled 2017-07-16: qty 50

## 2017-07-16 MED ORDER — ACETAMINOPHEN 650 MG RE SUPP: 650.0000 mg | Freq: Four times a day (QID) | RECTAL | Status: DC | PRN

## 2017-07-16 MED ORDER — IOPAMIDOL (ISOVUE-300) INJECTION 61%
30.0000 mL | Freq: Once | INTRAVENOUS | Status: AC | PRN
  Administered 2017-07-15: 30 mL via ORAL

## 2017-07-16 MED ORDER — ACETAMINOPHEN 325 MG PO TABS: 650.0000 mg | ORAL_TABLET | Freq: Four times a day (QID) | ORAL | Status: DC | PRN

## 2017-07-16 MED ORDER — HEPARIN SODIUM (PORCINE) 5000 UNIT/ML IJ SOLN
5000.0000 [IU] | Freq: Three times a day (TID) | INTRAMUSCULAR | Status: DC
  Administered 2017-07-16 – 2017-07-21 (×16): 5000 [IU] via SUBCUTANEOUS
  Filled 2017-07-16 (×16): qty 1

## 2017-07-16 MED ORDER — INSULIN ASPART 100 UNIT/ML ~~LOC~~ SOLN
0.0000 [IU] | Freq: Every day | SUBCUTANEOUS | Status: DC
  Administered 2017-07-16: 2 [IU] via SUBCUTANEOUS
  Administered 2017-07-17: 3 [IU] via SUBCUTANEOUS
  Administered 2017-07-18: 5 [IU] via SUBCUTANEOUS
  Administered 2017-07-19: 3 [IU] via SUBCUTANEOUS
  Administered 2017-07-20: 4 [IU] via SUBCUTANEOUS

## 2017-07-16 NOTE — H&P (Signed)
History and Physical    Eric Morton NOM:[id number] DOB: [birth year] DOA: 07/15/2017  PCP: [name], NP   Patient coming from: Home  Chief Complaint: Abnormal outpatient labs   HPI: Eric Morton is a 52 y.o. male with medical history significant for poorly controlled insulin-dependent diabetes mellitus, hypertension, and history of CVA with residual deficits, now presenting to the emergency department at the direction of his PCP for evaluation of elevated creatinine on his outpatient blood work.  Patient reports nausea yesterday with one episode of nonbloody vomiting, but denies any abdominal pain, diarrhea, fevers, or chills.  He has otherwise been in his usual state of health and saw his PCP on 07/14/2017.  He was complaining of right shoulder pain at the time of the PCP visit, but denies that now.  He was sent for routine blood work, was noted to have a serum glucose in the 400s and creatinine of 4.4, previously in the 1 range.  Most recent prior chemistry panel was last November and creatinine was 1.08 at that time.  Except for the episode of vomiting yesterday, patient denies any recent vomiting or diarrhea.  Reports a good appetite.  States that he has not used alcohol or illicit substances since his admission for CVA last fall.  When the blood work returned, patient's PCP instructed him to present to the emergency department for further evaluation of this.  ED Course: Upon arrival to the ED, patient is found to be afebrile, saturating well on room air, and with vitals otherwise normal.  Chemistry panel is notable for potassium 3.3, glucose 200, BUN 52, and creatinine of 4.21.  CBC features a normocytic anemia with hemoglobin of 10.7, down from 12.3 last November.  Urinalysis features glucosuria, microscopic hematuria, and proteinuria.  Patient was given 2 L normal saline in the ED, remained stable, and will be admitted for ongoing evaluation and management of renal insufficiency.  Review  of Systems:  All other systems reviewed and apart from HPI, are negative.  Past Medical History:  Diagnosis Date  . 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 Anxiety   . Chronic kidney disease    "jsut a little damage"  . Glaucoma   . History of stomach ulcers    not confirmed, never underwent diagnostic endo or radiology, this manifested as epigastric distress.   [name] Hx of adenomatous colonic polyps   . Hypercholesterolemia   . Hypertension   . Peripheral neuropathy   . Polycythemia vera ([place]) 2006  . Renal insufficiency 07/16/2017  . Stroke [facility])    slurred speech  . Substance abuse ([place])   . Type II diabetes mellitus ([place])     Past Surgical History:  Procedure Laterality Date  . COLONOSCOPY WITH PROPOFOL N/A 04/28/2014   Procedure: COLONOSCOPY WITH PROPOFOL;  Surgeon: Gatha Mayer, MD;  Location: [place];  Service: Endoscopy;  Laterality: N/A;  . IRRIGATION AND DEBRIDEMENT ABSCESS  09/26/2011   Procedure: IRRIGATION AND DEBRIDEMENT ABSCESS;  Surgeon: Imogene Burn. Georgette Dover, MD;  Location: [place];  Service: General;  Laterality: Left;  Incision and Drainage left breast abscess     reports that he has quit smoking. His smoking use included cigarettes. He has a 8.00 pack-year smoking history. He has never used smokeless tobacco. He reports that he drinks alcohol. He reports that he has current or past drug history. Drugs: Marijuana and "Crack" cocaine.  Allergies  Allergen Reactions  . Lipitor [Atorvastatin] Other (See  Comments)    Sister states had to have Cardiac Surgery    Family History  Problem Relation Age of Onset  . Diabetes Mother   . Hypertension Mother      Prior to Admission medications   Medication Sig Start Date End Date Taking? Authorizing Provider  amLODipine (NORVASC) 5 MG tablet Take 1 tablet (5 mg total) by mouth daily. 07/14/17   [name], NP  aspirin (ASPIRIN CHILDRENS) 81 MG chewable tablet Chew 1 tablet (81 mg total) by mouth  daily. 11/28/16 11/28/17  Angiulli, Lavon Paganini, PA-C  Blood Glucose Monitoring Suppl (TRUE METRIX GO GLUCOSE METER) w/Device KIT USE AS DIRECTED 12/12/16   [provider]  clopidogrel (PLAVIX) 75 MG tablet Take 1 tablet (75 mg total) by mouth daily. 07/14/17   [name], NP  folic acid (FOLVITE) 1 MG tablet TAKE 1 TABLET BY MOUTH DAILY 06/22/17   [name], NP  gabapentin (NEURONTIN) 300 MG capsule Take 1 capsule (300 mg total) by mouth 3 (three) times daily. 07/14/17 08/13/17  [name], NP  glipiZIDE (GLUCOTROL) 10 MG tablet Take 1 tablet (10 mg total) by mouth daily before breakfast. 07/14/17 08/13/17  [name], NP  glucose blood (TRUE METRIX BLOOD GLUCOSE TEST) test strip 1 each by Other route 3 (three) times daily. 07/14/17   [name], NP  Insulin Glargine (LANTUS) 100 UNIT/ML Solostar Pen Inject 20 Units into the skin daily at 10 pm. 07/14/17 08/13/17  [name], NP  Insulin Pen Needle (B-D UF III MINI PEN NEEDLES) 31G X 5 MM MISC Use as instructed 07/14/17   [name], NP  lisinopril-hydrochlorothiazide (PRINZIDE,ZESTORETIC) 20-25 MG tablet Take 1 tablet by mouth daily. 07/14/17   [name], NP  metFORMIN (GLUCOPHAGE) 1000 MG tablet Take 1 tablet (1,000 mg total) by mouth 2 (two) times daily with a meal. 07/14/17 08/13/17  [name], NP  Multiple Vitamin (MULTIVITAMIN WITH MINERALS) TABS tablet Take 1 tablet by mouth daily. 11/29/16   Angiulli, Lavon Paganini, PA-C  pantoprazole (PROTONIX) 40 MG tablet Take 1 tablet (40 mg total) by mouth daily at 6 (six) AM. 07/14/17 10/12/17  [name], NP  rosuvastatin (CRESTOR) 40 MG tablet Take 1 tablet (40 mg total) by mouth daily at 6 PM. 07/14/17 10/12/17  [name], NP  sertraline (ZOLOFT) 25 MG tablet Take 1 tablet (25 mg total) by mouth daily. 07/14/17 10/12/17  [name], NP  TRUEPLUS LANCETS 28G MISC 1 each by Does not apply route 3 (three) times daily. 07/14/17   [name], NP     Physical Exam: Vitals:   07/16/17 0045 07/16/17 0100 07/16/17 0115 07/16/17 0230  BP: (!) 145/91 (!) 149/87 (!) 146/89 (!) 149/85  Pulse: 81 74 74 78  Resp:    16  Temp:      TempSrc:      SpO2: 100% 100% 100% 100%  Weight:      Height:          Constitutional: NAD, calm  Eyes: PERTLA, lids and conjunctivae normal ENMT: Mucous membranes are moist. Posterior pharynx clear of any exudate or lesions.   Neck: normal, supple, no masses, no thyromegaly Respiratory: clear to auscultation bilaterally, no wheezing, no crackles. Normal respiratory effort.   Cardiovascular: S1 & S2 heard, regular rate and rhythm. No extremity edema.  Abdomen: No distension, no tenderness, soft. Bowel sounds active.  Musculoskeletal: no clubbing / cyanosis. No joint  deformity upper and lower extremities.   Skin: no significant rashes, lesions, ulcers. Warm, dry, well-perfused. Neurologic: Dysarthric. Hemiparetic.   Psychiatric: Alert and oriented x 3. Pleasant and cooperative.     Labs on Admission: I have personally reviewed following labs and imaging studies  CBC: Recent Labs  Lab 07/14/17 1626 07/15/17 2042  WBC 4.6 6.3  HGB 10.8* 10.7*  HCT 32.9* 33.0*  MCV 83 82.3  PLT 185 062   Basic Metabolic Panel: Recent Labs  Lab 07/14/17 1626 07/15/17 2042  NA 135 139  K 3.0* 3.3*  CL 98 106  CO2 18* 19*  GLUCOSE 439* 200*  BUN 52* 52*  CREATININE 4.45* 4.21*  CALCIUM 8.5* 9.2   GFR: Estimated Creatinine Clearance: 18.8 mL/min (A) (by C-G formula based on SCr of 4.21 mg/dL (H)). Liver Function Tests: Recent Labs  Lab 07/14/17 1626  AST 14  ALT 13  ALKPHOS 117  BILITOT 0.3  PROT 7.4  ALBUMIN 4.0   No results for input(s): LIPASE, AMYLASE in the last 168 hours. No results for input(s): AMMONIA in the last 168 hours. Coagulation Profile: No results for input(s): INR, PROTIME in the last 168 hours. Cardiac Enzymes: No results for input(s): CKTOTAL, CKMB, CKMBINDEX, TROPONINI  in the last 168 hours. BNP (last 3 results) No results for input(s): PROBNP in the last 8760 hours. HbA1C: Recent Labs    07/14/17 1438  HGBA1C 13.2*   CBG: Recent Labs  Lab 07/15/17 2044  GLUCAP 197*   Lipid Profile: Recent Labs    07/14/17 1626  CHOL 146  HDL 35*  LDLCALC 62  TRIG 243*  CHOLHDL 4.2   Thyroid Function Tests: No results for input(s): TSH, T4TOTAL, FREET4, T3FREE, THYROIDAB in the last 72 hours. Anemia Panel: No results for input(s): VITAMINB12, FOLATE, FERRITIN, TIBC, IRON, RETICCTPCT in the last 72 hours. Urine analysis:    Component Value Date/Time   COLORURINE YELLOW 07/16/2017 0043   APPEARANCEUR CLEAR 07/16/2017 0043   LABSPEC 1.012 07/16/2017 0043   PHURINE 5.0 07/16/2017 0043   GLUCOSEU 50 (A) 07/16/2017 0043   HGBUR MODERATE (A) 07/16/2017 0043   BILIRUBINUR NEGATIVE 07/16/2017 0043   BILIRUBINUR Negative 07/14/2017 1621   KETONESUR NEGATIVE 07/16/2017 0043   PROTEINUR 100 (A) 07/16/2017 0043   UROBILINOGEN 0.2 07/14/2017 1621   UROBILINOGEN 1.0 04/25/2014 1751   NITRITE NEGATIVE 07/16/2017 0043   LEUKOCYTESUR NEGATIVE 07/16/2017 0043   Sepsis Labs: _0 (procalcitonin:4,lacticidven:4) )No results found for this or any previous visit (from the past 240 hour(s)).   Radiological Exams on Admission: Ct Abdomen Pelvis Wo Contrast  Result Date: 07/16/2017 CLINICAL DATA:  52 y/o  M; nausea, vomiting, and lower back pain. EXAM: CT ABDOMEN AND PELVIS WITHOUT CONTRAST TECHNIQUE: Multidetector CT imaging of the abdomen and pelvis was performed following the standard protocol without IV contrast. COMPARISON:  04/25/2014 CT abdomen and pelvis FINDINGS: Lower chest: No acute abnormality. Hepatobiliary: No focal liver abnormality is seen. No gallstones, gallbladder wall thickening, or biliary dilatation. Pancreas: Unremarkable. No pancreatic ductal dilatation or surrounding inflammatory changes. Spleen: Normal in size without focal abnormality.  Adrenals/Urinary Tract: Adrenal glands are unremarkable. Kidneys are normal, without renal calculi, focal lesion, or hydronephrosis. Bladder is unremarkable. Stomach/Bowel: Rugal thickening of the nondistended proximal gastric body and cardia. Appendix appears normal. No evidence of bowel wall thickening, distention, or inflammatory changes. Vascular/Lymphatic: Aortic atherosclerosis. No enlarged abdominal or pelvic lymph nodes. Reproductive: Prostate is unremarkable. Other: No abdominal wall hernia or abnormality. No abdominopelvic ascites. Musculoskeletal: No  fracture is seen. IMPRESSION: Rugal thickening of the nondistended proximal gastric body and cardia may represent gastritis. No findings of perforation or abscess. Aortic atherosclerosis. Electronically Signed   By: Kristine Garbe M.D.   On: 07/16/2017 01:58    EKG: Not performed.   Assessment/Plan  1. Renal insufficiency  - Presents at the direction of his PCP for elevated BUN and creatinine  - BUN is 52 and SCr 4.21; was 13 and 1.08 on most recent prior chem panel from November 2018  - Kidneys appear normal on CT abd/pelvis in ED  - Unclear chronicity  - Hydrated with 2 liters NS in ED  - Check urine chemistries, renally-dose medications, avoid nephrotoxins, repeat chem panel in am    2. Type II DM  - A1c was 13.2% this week  - Managed at home with Lantus, glipizide, and metformin  - Hold the oral agents, check CBG's, continue Lantus and start Novolog correctional    3. Hx of CVA  - He has residual dysarthria and hemiparesis, denies any new deficits  - Continue ASA, Plavix, and statin    4. Hypertension  - BP at goal  - Continue Norvasc, hold lisinopril-HCTZ until renal function stabilizes   5. Hypokalemia  - Serum potassium is 3.3 on admission  - Treated with 20 mEq oral potassium  - Repeat chem panel in am   6. Normocytic anemia  - Hgb is 10.7 on admission, down from 12.3 last November  - No bleeding  identified; possibly secondary to renal disease, which would suggest the creatinine elevation is a more chronic development  - Check anemia panel    DVT prophylaxis: sq heparin  Code Status: Full  Family Communication: Discussed with patient Consults called: None Admission status: Inpatient    Vianne Bulls, MD [facility] Pager [phone]  If 7PM-7AM, please contact night-coverage [email or web address] Password [facility]  07/16/2017, 2:36 AM

## 2017-07-16 NOTE — ED Notes (Signed)
Admitting Provider at bedside. 

## 2017-07-16 NOTE — ED Notes (Signed)
Attempted report x1, left call back number with secretary.  

## 2017-07-16 NOTE — ED Notes (Signed)
Patient transported to CT 

## 2017-07-16 NOTE — Progress Notes (Signed)
Eric Morton was admitted to 5w11 from the ED via stretcher.  The patient is alert and oriented x4 and has some baseline dysarthria due to a prior CVA.  Bed is in the lowest position, call bell and telephone are within reach.  Explained to patient how to use call bell and telephone, patient indicated understanding, but also indicated that he could not see the numbers on the telephone.  Admission booklet given.  Will continue to monitor.

## 2017-07-16 NOTE — Progress Notes (Signed)
Inpatient Diabetes Program Recommendations  AACE/ADA: New Consensus Statement on Inpatient Glycemic Control (2019)  Target Ranges:  Prepandial:   less than 140 mg/dL      Peak postprandial:   less than 180 mg/dL (1-2 hours)      Critically ill patients:  140 - 180 mg/dL   Results for Eric Morton, Eric Morton (MRN [id number]) as of 07/16/2017 12:59  Ref. Range 07/15/2017 20:44 07/16/2017 08:38 07/16/2017 12:15  Glucose-Capillary Latest Ref Range: 65 - 99 mg/dL 197 (H) 132 (H) 130 (H)  Results for Eric Morton, Eric Morton (MRN [id number]) as of 07/16/2017 12:59  Ref. Range 10/23/2016 10:13 07/14/2017 14:38  Hemoglobin A1C Latest Ref Range: 4.0 - 5.6 % 8.9 (H) 13.2 (A)   Review of Glycemic Control  Diabetes history: DM2 Outpatient Diabetes medications: Lantus 20 units QHS, Glipizide 10 mg QAM, Metformin 1000 mg BID Current orders for Inpatient glycemic control: Lantus 10 units QHS, Novolog 0-9 units TID with meals, Novolog 0-5 units QHS  Inpatient Diabetes Program Recommendations: HgbA1C: A1C 13.2% on 07/14/17 indicating an average glucose of 332 mg/dl over the past 2-3 months.   NOTE: Spoke with patient about diabetes and home regimen for diabetes control. Patient reports that his sister takes care of his DM medications and checks his glucose at home for him. Patient states that he recently went to [facility] on 07/14/17 and his DM medications were changed. Patient states that his glucose was as high as 480 mg/dl earlier in the week before he went to the clinic. Patient asked that I call his sister about DM control and medication regimen. Discussed A1C results (13.2% on 07/14/17) and explained that his average glucose is 332 mg/dl over the past 2-3 months.  Discussed glucose and A1C goals. Discussed importance of checking CBGs and maintaining good CBG control to prevent long-term and short-term complications. Explained how hyperglycemia leads to damage within blood vessels which lead to the  common complications seen with uncontrolled diabetes. Stressed to the patient the importance of improving glycemic control to prevent further complications from uncontrolled diabetes. Stressed to patient that if he did not get his DM under control he is at very high risk of developing further complications from uncontrolled DM. Patient states that he knows he has to do better and let his sister check his glucose and eat what she cooks for him. Patient verbalized understanding of information discussed and he states that he has no further questions at this time related to diabetes. Called patient's sister [name]) and she states that patient went to [facility] on [facility] and that his Metformin was increased from 500 to 1000 mg BID, Glipizide 10 mg QAM was started, and Lantus 20 units QHS was added.  Ms. [name] states that patient does not eat what she cooks if it is not what he wants and she reports that he eats a lot of high carbohydrate foods and he does not get any exercise. Ms. [name] reports that she started checking her brother's glucose as directed and her brother gets mad at her when she checks his glucose. Discussed A1C 13.2% and importance of improving DM control. Ms. [name] reports that their brother passed away last week and she feels patient has been upset because of that and she also notes that during the wake he had been eating and drinking things she normally does not give him. Ms. [name] states that she has talked with her brother and she has told him he has 30 days to do  better or she may have to have him placed due to the stress he is causing her. Provided emotional support. Ms. [name] verbalized understanding of information discussed and she states that she has no further questions at this time.  Thanks, [name], RN, MSN, CDE Diabetes Coordinator Inpatient Diabetes Program [phone] (Team Pager)

## 2017-07-16 NOTE — Progress Notes (Signed)
Eric Morton is a 52 y.o. male with medical history significant for poorly controlled insulin-dependent diabetes mellitus, hypertension, and history of CVA with residual deficits, now presenting to the emergency department at the direction of his PCP for evaluation of elevated creatinine on his outpatient blood work. His baseline creatinine is around 1 and he was found to have creatinine of 4 . He was admitted for AKI. CT abd does not show any hydronephrosis.    Plan: 1. Continue with IV fluids and monitor creatinine, .  If no improvement, will request renal consult in am.   2. Get mag level.   3. Replace potassium.   4. FeNa is 1.4, ATN ?   5. Hold metformin for AKI. Slowly increase the lantus to optimize cbg's.    Hosie Poisson,  MD [phone]

## 2017-07-16 NOTE — Telephone Encounter (Signed)
SCAT application faxed to SCAT eligibility  

## 2017-07-17 DIAGNOSIS — Z794 Long term (current) use of insulin: Secondary | ICD-10-CM

## 2017-07-17 DIAGNOSIS — N179 Acute kidney failure, unspecified: Principal | ICD-10-CM

## 2017-07-17 LAB — BASIC METABOLIC PANEL
ANION GAP: 7 (ref 5–15)
BUN: 32 mg/dL — ABNORMAL HIGH (ref 6–20)
CHLORIDE: 112 mmol/L — AB (ref 101–111)
CO2: 22 mmol/L (ref 22–32)
Calcium: 8.7 mg/dL — ABNORMAL LOW (ref 8.9–10.3)
Creatinine, Ser: 2.56 mg/dL — ABNORMAL HIGH (ref 0.61–1.24)
GFR calc non Af Amer: 27 mL/min — ABNORMAL LOW (ref >60)
GFR, EST AFRICAN AMERICAN: 31 mL/min — AB (ref >60)
Glucose, Bld: 166 mg/dL — ABNORMAL HIGH (ref 65–99)
Potassium: 3.4 mmol/L — ABNORMAL LOW (ref 3.5–5.1)
SODIUM: 141 mmol/L (ref 135–145)

## 2017-07-17 LAB — GLUCOSE, CAPILLARY
GLUCOSE-CAPILLARY: 155 mg/dL — AB (ref 65–99)
GLUCOSE-CAPILLARY: 249 mg/dL — AB (ref 65–99)
GLUCOSE-CAPILLARY: 176 mg/dL — AB (ref 65–99)
GLUCOSE-CAPILLARY: 294 mg/dL — AB (ref 65–99)

## 2017-07-17 LAB — UREA NITROGEN, URINE: Urea Nitrogen, Ur: 376 mg/dL

## 2017-07-17 MED ORDER — POTASSIUM CHLORIDE CRYS ER 20 MEQ PO TBCR
40.0000 meq | EXTENDED_RELEASE_TABLET | Freq: Once | ORAL | Status: AC
  Administered 2017-07-17: 40 meq via ORAL
  Filled 2017-07-17: qty 2

## 2017-07-17 MED ORDER — INSULIN GLARGINE 100 UNIT/ML ~~LOC~~ SOLN
12.0000 [IU] | Freq: Every day | SUBCUTANEOUS | Status: DC
  Administered 2017-07-17 – 2017-07-18 (×2): 12 [IU] via SUBCUTANEOUS
  Filled 2017-07-17 (×2): qty 0.12

## 2017-07-17 MED ORDER — SODIUM CHLORIDE 0.9 % IV SOLN
INTRAVENOUS | Status: AC
  Administered 2017-07-17: 09:00:00 via INTRAVENOUS

## 2017-07-17 MED ORDER — LORAZEPAM 0.5 MG PO TABS: 0.5000 mg | ORAL_TABLET | Freq: Every day | ORAL | Status: DC | PRN

## 2017-07-17 MED ORDER — TRAZODONE HCL 50 MG PO TABS
50.0000 mg | ORAL_TABLET | Freq: Every day | ORAL | Status: DC
  Administered 2017-07-17 – 2017-07-20 (×4): 50 mg via ORAL
  Filled 2017-07-17 (×4): qty 1

## 2017-07-17 MED ORDER — ZOLPIDEM TARTRATE 5 MG PO TABS: 5.0000 mg | ORAL_TABLET | Freq: Every evening | ORAL | Status: DC | PRN

## 2017-07-17 MED ORDER — INSULIN ASPART 100 UNIT/ML ~~LOC~~ SOLN
2.0000 [IU] | Freq: Three times a day (TID) | SUBCUTANEOUS | Status: DC
  Administered 2017-07-17 – 2017-07-19 (×6): 2 [IU] via SUBCUTANEOUS

## 2017-07-17 MED ORDER — MAGNESIUM SULFATE 4 GM/100ML IV SOLN
4.0000 g | Freq: Once | INTRAVENOUS | Status: AC
  Administered 2017-07-17: 4 g via INTRAVENOUS
  Filled 2017-07-17: qty 100

## 2017-07-17 NOTE — Progress Notes (Signed)
Inpatient Diabetes Program Recommendations  AACE/ADA: New Consensus Statement on Inpatient Glycemic Control (2019)  Target Ranges:  Prepandial:   less than 140 mg/dL      Peak postprandial:   less than 180 mg/dL (1-2 hours)      Critically ill patients:  140 - 180 mg/dL   Results for Eric Morton, Eric Morton (MRN [id number]) as of 07/17/2017 09:38  Ref. Range 07/16/2017 08:38 07/16/2017 12:15 07/16/2017 16:57 07/16/2017 20:13 07/16/2017 22:21 07/17/2017 08:04  Glucose-Capillary Latest Ref Range: 65 - 99 mg/dL 132 (H) 130 (H) 211 (H) 245 (H) 238 (H) 155 (H)  Results for Eric Morton, Eric Morton (MRN [id number]) as of 07/17/2017 09:38  Ref. Range 10/23/2016 10:13 07/14/2017 14:38  Hemoglobin A1C Latest Ref Range: 4.0 - 5.6 % 8.9 (H) 13.2 (A)   Review of Glycemic Control  Diabetes history: DM2 Outpatient Diabetes medications: Lantus 20 units QHS, Glipizide 10 mg QAM, Metformin 1000 mg BID Current orders for Inpatient glycemic control: Lantus 10 units QHS, Novolog 0-9 units TID with meals, Novolog 0-5 units QHS  Inpatient Diabetes Program Recommendations: Insulin-Basal: Ordered Lantus 10 units QHS and fasting glucose 155 mg/dl today. Agree with current basal insulin dose. Insulin-Meal Coverage: Please consider ordering Novolog 2 units TID with meals for meal coverage if patient eats at least 50% of meals. HgbA1C: A1C 13.2% on 07/14/17 indicating an average glucose of 332 mg/dl over the past 2-3 months.   Thanks, [name], RN, MSN, CDE Diabetes Coordinator Inpatient Diabetes Program [phone] (Team Pager from 8am to 5pm)

## 2017-07-17 NOTE — Progress Notes (Signed)
PROGRESS NOTE    Eric Morton  IPJ:[id number] DOB: 02-12-1965 DOA: 07/15/2017 PCP: [name], NP    Brief Narrative: Eric Morton a 52 y.o.malewith medical history significant forpoorly controlled insulin-dependent diabetes mellitus, hypertension, and history of CVA with residual deficits, now presenting to the emergency department at the direction of his PCP for evaluation of elevated creatinine on his outpatient blood work. His baseline creatinine is around 1 and he was found to have creatinine of 4 . He was admitted for AKI. CT abd does not show any hydronephrosis.    Assessment & Plan:   Principal Problem:   Renal insufficiency Active Problems:   Essential hypertension   Type II diabetes mellitus (HCC)   History of CVA (cerebrovascular accident)   Normocytic anemia   Hypokalemia   AKI: Prerenal vs ATN.  CT abd does not reveal any hydronephrosis.  Improving with hydration.  Discontinued metformin.  Avoid NSAIDS.  Adequate urine output.    Hypertension: well controlled. Stopping the lisinopril and HCTZ.  Can be resumed as outpt once creatinine is back to baseline.    H/o CVA with residual speech deficits Currently on aspirin and plavix.  Get PT evaluation as pt's sister reports difficulty ambulating at home due to his hemiparesis.     Uncontrolled DM with A1c of 13.  CBG (last 3)  Recent Labs    07/16/17 2013 07/16/17 2221 07/17/17 0804  GLUCAP 245* 238* 155*   Stopped the metformin due to AKI,  Added novolog 2 units TIDAC.  Slowly increase his lantus to 12 units, .  Will add glipizide to his regimen.    Normocytic anemia: probably from chronic disease Anemia panel shows adequate iron, ferrin, folate and vita min b12 levels with reticulocyte count.    Hypokalemia and hypomagnesemia:  Replaced and repeat in am.     DVT prophylaxis: HEPARIN. Code Status:  Full code Family Communication:  Discussed the plan with sister on the phone,  none at bedside.  Disposition Plan: pending resolution of AKI. Possibly home tomorrow.  Consultants:   None.   Procedures:CT abd and pelvis  Antimicrobials: none.   Subjective: Requesting medication for sleep.    Objective: Vitals:   07/16/17 0513 07/16/17 1657 07/16/17 2205 07/17/17 0411  BP: 124/72 124/82 126/83 126/74  Pulse: 81 83 71 79  Resp: 12 16 17 18   Temp: 97.8 F (36.6 C)  98 F (36.7 C) (!) 97.5 F (36.4 C)  TempSrc:   Oral Oral  SpO2: 100% 93% 100% 100%  Weight: 76.2 kg (167 lb 15.9 oz)   76.4 kg (168 lb 6.9 oz)  Height:        Intake/Output Summary (Last 24 hours) at 07/17/2017 1034 Last data filed at 07/17/2017 1025 Gross per 24 hour  Intake 1999.58 ml  Output 2225 ml  Net -225.42 ml   Filed Weights   07/16/17 0334 07/16/17 0513 07/17/17 0411  Weight: 76.2 kg (167 lb 15.9 oz) 76.2 kg (167 lb 15.9 oz) 76.4 kg (168 lb 6.9 oz)    Examination:  General exam: Appears calm and comfortable  Respiratory system: Clear to auscultation. Respiratory effort normal. Cardiovascular system: S1 & S2 heard, RRR. No JVD, murmurs, rubs, gallops or clicks. No pedal edema. Gastrointestinal system: Abdomen is nondistended, soft and nontender. No organomegaly or masses felt. Normal bowel sounds heard. Central nervous system: Alert and oriented. Dysarthria,  Extremities: Symmetric 5 x 5 power. Skin: No rashes, lesions or ulcers Psychiatry: Mood & affect appropriate.  Data Reviewed: I have personally reviewed following labs and imaging studies  CBC: Recent Labs  Lab 07/14/17 1626 07/15/17 2042  WBC 4.6 6.3  HGB 10.8* 10.7*  HCT 32.9* 33.0*  MCV 83 82.3  PLT 185 017   Basic Metabolic Panel: Recent Labs  Lab 07/14/17 1626 07/15/17 2042 07/16/17 0344 07/17/17 0621  NA 135 139 140 141  K 3.0* 3.3* 3.0* 3.4*  CL 98 106 109 112*  CO2 18* 19* 19* 22  GLUCOSE 439* 200* 167* 166*  BUN 52* 52* 49* 32*  CREATININE 4.45* 4.21* 3.75* 2.56*  CALCIUM 8.5* 9.2  8.4* 8.7*  MG  --   --  1.1*  --    GFR: Estimated Creatinine Clearance: 30.8 mL/min (A) (by C-G formula based on SCr of 2.56 mg/dL (H)). Liver Function Tests: Recent Labs  Lab 07/14/17 1626  AST 14  ALT 13  ALKPHOS 117  BILITOT 0.3  PROT 7.4  ALBUMIN 4.0   No results for input(s): LIPASE, AMYLASE in the last 168 hours. No results for input(s): AMMONIA in the last 168 hours. Coagulation Profile: No results for input(s): INR, PROTIME in the last 168 hours. Cardiac Enzymes: No results for input(s): CKTOTAL, CKMB, CKMBINDEX, TROPONINI in the last 168 hours. BNP (last 3 results) No results for input(s): PROBNP in the last 8760 hours. HbA1C: Recent Labs    07/14/17 1438  HGBA1C 13.2*   CBG: Recent Labs  Lab 07/16/17 1215 07/16/17 1657 07/16/17 2013 07/16/17 2221 07/17/17 0804  GLUCAP 130* 211* 245* 238* 155*   Lipid Profile: Recent Labs    07/14/17 1626  CHOL 146  HDL 35*  LDLCALC 62  TRIG 243*  CHOLHDL 4.2   Thyroid Function Tests: No results for input(s): TSH, T4TOTAL, FREET4, T3FREE, THYROIDAB in the last 72 hours. Anemia Panel: Recent Labs    07/16/17 0344  VITAMINB12 572  FOLATE 54.6  FERRITIN 235  TIBC 266  IRON 78  RETICCTPCT 0.8   Sepsis Labs: No results for input(s): PROCALCITON, LATICACIDVEN in the last 168 hours.  No results found for this or any previous visit (from the past 240 hour(s)).       Radiology Studies: Ct Abdomen Pelvis Wo Contrast  Result Date: 07/16/2017 CLINICAL DATA:  52 y/o  M; nausea, vomiting, and lower back pain. EXAM: CT ABDOMEN AND PELVIS WITHOUT CONTRAST TECHNIQUE: Multidetector CT imaging of the abdomen and pelvis was performed following the standard protocol without IV contrast. COMPARISON:  04/25/2014 CT abdomen and pelvis FINDINGS: Lower chest: No acute abnormality. Hepatobiliary: No focal liver abnormality is seen. No gallstones, gallbladder wall thickening, or biliary dilatation. Pancreas: Unremarkable. No  pancreatic ductal dilatation or surrounding inflammatory changes. Spleen: Normal in size without focal abnormality. Adrenals/Urinary Tract: Adrenal glands are unremarkable. Kidneys are normal, without renal calculi, focal lesion, or hydronephrosis. Bladder is unremarkable. Stomach/Bowel: Rugal thickening of the nondistended proximal gastric body and cardia. Appendix appears normal. No evidence of bowel wall thickening, distention, or inflammatory changes. Vascular/Lymphatic: Aortic atherosclerosis. No enlarged abdominal or pelvic lymph nodes. Reproductive: Prostate is unremarkable. Other: No abdominal wall hernia or abnormality. No abdominopelvic ascites. Musculoskeletal: No fracture is seen. IMPRESSION: Rugal thickening of the nondistended proximal gastric body and cardia may represent gastritis. No findings of perforation or abscess. Aortic atherosclerosis. Electronically Signed   By: Kristine Garbe M.D.   On: 07/16/2017 01:58        Scheduled Meds: . amLODipine  5 mg Oral Daily  . aspirin  81 mg Oral Daily  .  clopidogrel  75 mg Oral Daily  . gabapentin  100 mg Oral TID  . heparin injection (subcutaneous)  5,000 Units Subcutaneous Q8H  . insulin aspart  0-5 Units Subcutaneous QHS  . insulin aspart  0-9 Units Subcutaneous TID WC  . insulin aspart  2 Units Subcutaneous TID WC  . insulin glargine  10 Units Subcutaneous QHS  . multivitamin with minerals  1 tablet Oral Daily  . pantoprazole  40 mg Oral Q0600  . rosuvastatin  40 mg Oral q1800  . sertraline  25 mg Oral Daily   Continuous Infusions: . sodium chloride 100 mL/hr at 07/17/17 0854  . magnesium sulfate 1 - 4 g bolus IVPB 4 g (07/17/17 0856)     LOS: 1 day    Time spent: 35 minutes.     Hosie Poisson, MD [facility] Pager [phone]   If 7PM-7AM, please contact night-coverage [email or web address] Password [facility] 07/17/2017, 10:34 AM

## 2017-07-17 NOTE — Evaluation (Signed)
Physical Therapy Evaluation Patient Details Name: Eric Morton MRN: [id number] DOB: 1965-05-08 Today's Date: 07/17/2017   History of Present Illness  Pt is a 52 y/o male admitted secondary to renal insuffienciency. 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   Clinical Impression  Pt admitted secondary to problem above with deficits below. Pt with R sided deficits and visual deficits at baseline secondary to CVA. Noted decreased coordination and mild unsteadiness during gait with RW. Required min A for directional cues and RW navigation during gait. Pt reports increased stability with use of RW and requesting one for home. Pt reports sister will be able to assist as needed upon d/c. Will continue to follow acutely to maximize functional mobility independence and safety.     Follow Up Recommendations Home health PT;Supervision for mobility/OOB    Equipment Recommendations  Rolling walker with 5" wheels    Recommendations for Other Services OT consult     Precautions / Restrictions Precautions Precautions: Fall Restrictions Weight Bearing Restrictions: No      Mobility  Bed Mobility Overal bed mobility: Needs Assistance Bed Mobility: Supine to Sit;Sit to Supine     Supine to sit: Supervision Sit to supine: Supervision   General bed mobility comments: Supervision for safety. Increased time required with use of bed rails and elevated HOB   Transfers Overall transfer level: Needs assistance Equipment used: Rolling walker (2 wheeled) Transfers: Sit to/from Stand Sit to Stand: Min guard         General transfer comment: Min guard for safety. Verbal cues for safe hand placement.   Ambulation/Gait Ambulation/Gait assistance: Min assist Gait Distance (Feet): 50 Feet Assistive device: Rolling walker (2 wheeled) Gait Pattern/deviations: Step-through pattern;Decreased stride length;Ataxic Gait velocity: Decreased    General Gait Details: Slow,  mildly unsteady gait. Pt with visual deficits, therefore required assist with guiding RW during ambulation; also required directional cues. Noted decreased coordination in RLE, however, pt reports it is baseline secondary to previous CVA. Reports increased stability with use of RW.   Stairs            Wheelchair Mobility    Modified Rankin (Stroke Patients Only)       Balance Overall balance assessment: Needs assistance Sitting-balance support: No upper extremity supported;Feet supported Sitting balance-Leahy Scale: Good     Standing balance support: Bilateral upper extremity supported;During functional activity Standing balance-Leahy Scale: Poor Standing balance comment: Reliant on BUE support                              Pertinent Vitals/Pain Pain Assessment: No/denies pain    Home Living Family/patient expects to be discharged to:: Private residence Living Arrangements: Other relatives;Other (Comment)(sister ) Available Help at Discharge: Family Type of Home: Apartment Home Access: Stairs to enter Entrance Stairs-Rails: Right Entrance Stairs-Number of Steps: [place]: One level Home Equipment: Other (comment);Bedside commode;Tub bench(hemiwalker)      Prior Function Level of Independence: Needs assistance   Gait / Transfers Assistance Needed: Was ambulating with hemi walker, however, was very unsteady. Reports falls at home  ADL's / Homemaking Assistance Needed: Reports needimg assit with ADL tasks from his sister         Hand Dominance   Dominant Hand: Right    Extremity/Trunk Assessment   Upper Extremity Assessment Upper Extremity Assessment: Defer to OT evaluation(R sided weakness at baseline )    Lower Extremity Assessment Lower  Extremity Assessment: RLE deficits/detail RLE Deficits / Details: R coordination deficits and weakness at baseline secondary to CVA  RLE Coordination: decreased gross motor    Cervical / Trunk  Assessment Cervical / Trunk Assessment: Normal  Communication   Communication: Expressive difficulties  Cognition Arousal/Alertness: Awake/alert Behavior During Therapy: WFL for tasks assessed/performed Overall Cognitive Status: History of cognitive impairments - at baseline                                        General Comments General comments (skin integrity, edema, etc.): Pt reporting visual deficits at baseline secondary to previous CVA     Exercises     Assessment/Plan    PT Assessment Patient needs continued PT services  PT Problem List Decreased strength;Decreased balance;Decreased mobility;Decreased coordination;Decreased knowledge of use of DME;Decreased knowledge of precautions       PT Treatment Interventions DME instruction;Gait training;Functional mobility training;Stair training;Therapeutic activities;Therapeutic exercise;Balance training;Patient/family education    PT Goals (Current goals can be found in the Care Plan section)  Acute Rehab PT Goals Patient Stated Goal: to go home  PT Goal Formulation: With patient Time For Goal Achievement: 07/31/17 Potential to Achieve Goals: Good    Frequency Min 3X/week   Barriers to discharge        Co-evaluation               AM-PAC PT "6 Clicks" Daily Activity  Outcome Measure Difficulty turning over in bed (including adjusting bedclothes, sheets and blankets)?: A Little Difficulty moving from lying on back to sitting on the side of the bed? : A Little Difficulty sitting down on and standing up from a chair with arms (e.g., wheelchair, bedside commode, etc,.)?: Unable Help needed moving to and from a bed to chair (including a wheelchair)?: A Little Help needed walking in hospital room?: A Little Help needed climbing 3-5 steps with a railing? : A Lot 6 Click Score: 15    End of Session Equipment Utilized During Treatment: Gait belt Activity Tolerance: Patient tolerated treatment  well Patient left: in bed;with call bell/phone within reach;with bed alarm set Nurse Communication: Mobility status PT Visit Diagnosis: Unsteadiness on feet (R26.81);Other abnormalities of gait and mobility (R26.89);Muscle weakness (generalized) (M62.81)    Time: 1337-1410 PT Time Calculation (min) (ACUTE ONLY): 33 min   Charges:   PT Evaluation $PT Eval Low Complexity: 1 Low PT Treatments $Gait Training: 8-22 mins   PT G Codes:        Leighton Ruff, PT, DPT  Acute Rehabilitation Services  Pager: [phone]   Rudean Hitt 07/17/2017, 2:41 PM

## 2017-07-18 LAB — BASIC METABOLIC PANEL
ANION GAP: 6 (ref 5–15)
BUN: 24 mg/dL — ABNORMAL HIGH (ref 6–20)
CALCIUM: 8.7 mg/dL — AB (ref 8.9–10.3)
CO2: 23 mmol/L (ref 22–32)
Chloride: 112 mmol/L — ABNORMAL HIGH (ref 101–111)
Creatinine, Ser: 2.39 mg/dL — ABNORMAL HIGH (ref 0.61–1.24)
GFR, EST AFRICAN AMERICAN: 34 mL/min — AB (ref >60)
GFR, EST NON AFRICAN AMERICAN: 29 mL/min — AB (ref >60)
Glucose, Bld: 212 mg/dL — ABNORMAL HIGH (ref 65–99)
Potassium: 3.6 mmol/L (ref 3.5–5.1)
SODIUM: 141 mmol/L (ref 135–145)

## 2017-07-18 LAB — MAGNESIUM: MAGNESIUM: 2.2 mg/dL (ref 1.7–2.4)

## 2017-07-18 LAB — GLUCOSE, CAPILLARY
GLUCOSE-CAPILLARY: 167 mg/dL — AB (ref 65–99)
GLUCOSE-CAPILLARY: 193 mg/dL — AB (ref 65–99)
GLUCOSE-CAPILLARY: 260 mg/dL — AB (ref 65–99)
Glucose-Capillary: 363 mg/dL — ABNORMAL HIGH (ref 65–99)

## 2017-07-18 MED ORDER — SODIUM CHLORIDE 0.9 % IV SOLN
INTRAVENOUS | Status: DC
  Administered 2017-07-18 – 2017-07-20 (×4): via INTRAVENOUS

## 2017-07-18 MED ORDER — GLIPIZIDE 5 MG PO TABS
5.0000 mg | ORAL_TABLET | Freq: Two times a day (BID) | ORAL | Status: DC
  Administered 2017-07-18 – 2017-07-21 (×7): 5 mg via ORAL
  Filled 2017-07-18 (×7): qty 1

## 2017-07-18 MED ORDER — HYDROCORTISONE 1 % EX CREA
TOPICAL_CREAM | Freq: Two times a day (BID) | CUTANEOUS | Status: DC
  Administered 2017-07-18 – 2017-07-19 (×4): via TOPICAL
  Administered 2017-07-20: 1 via TOPICAL
  Administered 2017-07-20 – 2017-07-21 (×2): via TOPICAL
  Filled 2017-07-18: qty 28

## 2017-07-18 NOTE — Progress Notes (Signed)
Physical Therapy Treatment Patient Details Name: Eric Morton MRN: [id number] DOB: 06/26/1965 Today's Date: 07/18/2017    History of Present Illness Pt is a 52 y/o male admitted secondary to renal insuffienciency. 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     PT Comments    Session focused on ambulation and negotiating stairs for preparation for discharge home. Climbed 12 steps sideways with use of railing and min guard assist. Recommended patient sister provide supervision assist when entering/exiting apartment. Current d/c plan remains appropriate.     Follow Up Recommendations  Home health PT;Supervision for mobility/OOB     Equipment Recommendations  Rolling walker with 5" wheels    Recommendations for Other Services       Precautions / Restrictions Precautions Precautions: Fall Restrictions Weight Bearing Restrictions: No    Mobility  Bed Mobility Overal bed mobility: Needs Assistance Bed Mobility: Supine to Sit     Supine to sit: Supervision     General bed mobility comments: OOB in recliner  Transfers Overall transfer level: Needs assistance Equipment used: Rolling walker (2 wheeled) Transfers: Sit to/from Stand Sit to Stand: Min guard         General transfer comment: Min guard for safety. Verbal cues for safe hand placement with transition from stand to sit.   Ambulation/Gait Ambulation/Gait assistance: Min guard Gait Distance (Feet): 150 Feet Assistive device: Rolling walker (2 wheeled) Gait Pattern/deviations: Step-through pattern;Decreased stride length;Ataxic;Decreased step length - right Gait velocity: Decreased    General Gait Details: Patient requiring less assist this session with min guard provided, however does need assist with guiding RW during ambulation and directional cues due to visual deficits.   Stairs Stairs: Yes Stairs assistance: Min guard Stair Management: One rail Right;Sideways Number of  Stairs: 12 General stair comments: patient using a sideways stepping pattern with use of railing. min guard assist provided.   Wheelchair Mobility    Modified Rankin (Stroke Patients Only)       Balance Overall balance assessment: Needs assistance Sitting-balance support: No upper extremity supported;Feet supported Sitting balance-Leahy Scale: Good     Standing balance support: Bilateral upper extremity supported;During functional activity Standing balance-Leahy Scale: Poor Standing balance comment: Reliant on BUE support                             Cognition Arousal/Alertness: Awake/alert Behavior During Therapy: WFL for tasks assessed/performed Overall Cognitive Status: History of cognitive impairments - at baseline                                 General Comments: slow processing      Exercises      General Comments        Pertinent Vitals/Pain Pain Assessment: Faces Pain Score: 6  Faces Pain Scale: Hurts little more Pain Location: left lower back Pain Descriptors / Indicators: Aching Pain Intervention(s): Monitored during session    Home Living Family/patient expects to be discharged to:: Private residence Living Arrangements: Other relatives(sister) Available Help at Discharge: Family Type of Home: Apartment Home Access: Stairs to enter Entrance Stairs-Rails: Right Home Layout: One level Home Equipment: Other (comment);Bedside commode;Tub bench(hemiwalker)      Prior Function Level of Independence: Needs assistance  Gait / Transfers Assistance Needed: Was ambulating with hemi walker, however, was very unsteady. Reports falls at home ADL's / Homemaking Assistance Needed:  Reports needing assist with ADL tasks from his sister      PT Goals (current goals can now be found in the care plan section) Acute Rehab PT Goals Patient Stated Goal: to go home  PT Goal Formulation: With patient Time For Goal Achievement:  07/31/17 Potential to Achieve Goals: Good Progress towards PT goals: Progressing toward goals    Frequency    Min 3X/week      PT Plan Current plan remains appropriate    Co-evaluation              AM-PAC PT "6 Clicks" Daily Activity  Outcome Measure  Difficulty turning over in bed (including adjusting bedclothes, sheets and blankets)?: A Little Difficulty moving from lying on back to sitting on the side of the bed? : A Little Difficulty sitting down on and standing up from a chair with arms (e.g., wheelchair, bedside commode, etc,.)?: A Little Help needed moving to and from a bed to chair (including a wheelchair)?: A Little Help needed walking in hospital room?: A Little Help needed climbing 3-5 steps with a railing? : A Little 6 Click Score: 18    End of Session Equipment Utilized During Treatment: Gait belt Activity Tolerance: Patient tolerated treatment well Patient left: with call bell/phone within reach;in chair;with chair alarm set Nurse Communication: Mobility status PT Visit Diagnosis: Unsteadiness on feet (R26.81);Other abnormalities of gait and mobility (R26.89);Muscle weakness (generalized) (M62.81)     Time: 1610-9604 PT Time Calculation (min) (ACUTE ONLY): 27 min  Charges:  $Gait Training: 8-22 mins $Therapeutic Activity: 8-22 mins                    G Codes:       Ellamae Sia, PT, DPT Acute Rehabilitation Services  Pager: [phone]    Willy Eddy 07/18/2017, 4:59 PM

## 2017-07-18 NOTE — Evaluation (Signed)
Occupational Therapy Evaluation Patient Details Name: Eric Morton MRN: [id number] DOB: 1965/03/23 Today's Date: 07/18/2017    History of Present Illness Pt is a 52 y.o. male admitted secondary to renal insuffienciency. 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    Clinical Impression   Pt admitted for above. Pt getting assist with ADLs, PTA. Feel pt will benefit from acute OT to increase independence prior to d/c. Recommending HHOT upon d/c.     Follow Up Recommendations  Home health OT;Supervision/Assistance - 24 hour    Equipment Recommendations  Other (comment)(Two wheeled RW)    Recommendations for Other Services       Precautions / Restrictions Precautions Precautions: Fall Restrictions Weight Bearing Restrictions: No      Mobility Bed Mobility Overal bed mobility: Needs Assistance Bed Mobility: Supine to Sit     Supine to sit: Supervision        Transfers Overall transfer level: Needs assistance Equipment used: Rolling walker (2 wheeled) Transfers: Sit to/from Stand Sit to Stand: Min guard              Balance      Used RW for ambulation-Min guard given.                                     ADL either performed or assessed with clinical judgement   ADL Overall ADL's : Needs assistance/impaired Eating/Feeding: Set up;Sitting(assist opening crackers)                   Lower Body Dressing: Minimal assistance;Sit to/from stand   Toilet Transfer: Min guard;Ambulation;RW(sit to stand from bed)           Functional mobility during ADLs: Min guard;Rolling walker       Vision Baseline Vision/History: Glaucoma       Perception     Praxis      Pertinent Vitals/Pain Pain Assessment: 0-10 Pain Score: 6  Pain Location: back Pain Descriptors / Indicators: Aching Pain Intervention(s): Monitored during session     Hand Dominance     Extremity/Trunk Assessment Upper Extremity  Assessment Upper Extremity Assessment: RUE deficits/detail RUE Deficits / Details: baseline deficits from previous CVA-weak grasp  RUE Coordination: decreased fine motor   Lower Extremity Assessment Lower Extremity Assessment: Defer to PT evaluation       Communication Communication Communication: Expressive difficulties   Cognition Arousal/Alertness: Awake/alert Behavior During Therapy: WFL for tasks assessed/performed Overall Cognitive Status: No family/caregiver present to determine baseline cognitive functioning                                     General Comments       Exercises     Shoulder Instructions      Home Living Family/patient expects to be discharged to:: Private residence Living Arrangements: Other relatives(sister) Available Help at Discharge: Family Type of Home: Apartment Home Access: Stairs to enter Technical brewer of Steps: 17 Entrance Stairs-Rails: Right Home Layout: One level     Bathroom Shower/Tub: Teacher, early years/pre: Standard     Home Equipment: Other (comment);Bedside commode;Tub bench(hemiwalker)          Prior Functioning/Environment Level of Independence: Needs assistance  Gait / Transfers Assistance Needed: Was ambulating with hemi walker, however, was very unsteady. Reports  falls at home ADL's / Homemaking Assistance Needed: Reports needing assist with ADL tasks from his sister             OT Problem List: Impaired balance (sitting and/or standing);Impaired vision/perception;Decreased strength;Decreased knowledge of use of DME or AE;Decreased cognition;Decreased coordination;Decreased knowledge of precautions;Pain;Impaired UE functional use      OT Treatment/Interventions: Self-care/ADL training;Therapeutic activities;Cognitive remediation/compensation;Visual/perceptual remediation/compensation;Patient/family education;Balance training;DME and/or AE instruction    OT Goals(Current goals  can be found in the care plan section) Acute Rehab OT Goals Patient Stated Goal: wanted to eat OT Goal Formulation: With patient Time For Goal Achievement: 07/25/17 Potential to Achieve Goals: Good ADL Goals Pt Will Perform Grooming: standing;with min guard assist Pt Will Perform Lower Body Dressing: with min guard assist;sit to/from stand Pt Will Transfer to Toilet: with supervision;with set-up;ambulating;bedside commode Pt Will Perform Toileting - Clothing Manipulation and hygiene: with set-up;with supervision;sit to/from stand  OT Frequency: Min 2X/week   Barriers to D/C:            Co-evaluation              AM-PAC PT "6 Clicks" Daily Activity     Outcome Measure Help from another person eating meals?: A Little Help from another person taking care of personal grooming?: A Little Help from another person toileting, which includes using toliet, bedpan, or urinal?: A Little Help from another person bathing (including washing, rinsing, drying)?: A Little Help from another person to put on and taking off regular upper body clothing?: A Little Help from another person to put on and taking off regular lower body clothing?: A Little 6 Click Score: 18   End of Session Equipment Utilized During Treatment: Gait belt;Rolling walker  Activity Tolerance: Patient tolerated treatment well Patient left: in chair;with call bell/phone within reach;with chair alarm set  OT Visit Diagnosis: Unsteadiness on feet (R26.81)                Time: 1914-7829 OT Time Calculation (min): 17 min Charges:  OT General Charges $OT Visit: 1 Visit OT Evaluation $OT Eval Moderate Complexity: 1 Mod G-Codes:     Jandi Swiger L Shereese Bonnie OTR/L 07/18/2017, 1:58 PM

## 2017-07-18 NOTE — Progress Notes (Signed)
PROGRESS NOTE    Eric Morton  IDP:[id number] DOB: 1965/11/02 DOA: 07/15/2017 PCP: [name], NP    Brief Narrative: Eric Morton a 52 y.o.malewith medical history significant forpoorly controlled insulin-dependent diabetes mellitus, hypertension, and history of CVA with residual deficits, now presenting to the emergency department at the direction of his PCP for evaluation of elevated creatinine on his outpatient blood work. His baseline creatinine is around 1 and he was found to have creatinine of 4 . He was admitted for AKI. CT abd does not show any hydronephrosis.    Assessment & Plan:   Principal Problem:   Renal insufficiency Active Problems:   Essential hypertension   Type II diabetes mellitus (HCC)   History of CVA (cerebrovascular accident)   Normocytic anemia   Hypokalemia   AKI: Prerenal vs ATN.  CT abd does not reveal any hydronephrosis.  Improving with hydration. Creatinine plateau at 2.39. Unclear if his creatinine at 2 is his new baseline vs AKI.  Discontinued metformin.  Avoid NSAIDS.  Adequate urine output.    Hypertension: well controlled. Stopping the lisinopril and HCTZ.  Can be resumed as outpt once creatinine is back to baseline.    H/o CVA with residual speech deficits Currently on aspirin and plavix.  Get PT evaluation as pt's sister reports difficulty ambulating at home due to his hemiparesis.  Will order home health PT and OT.     Uncontrolled DM with A1c of 13.  CBG (last 3)  Recent Labs    07/17/17 2209 07/18/17 0832 07/18/17 1244  GLUCAP 294* 167* 193*   Stopped the metformin due to AKI,  Added novolog 2 units TIDAC.  Slowly increase his lantus to 12 units, .  Started glipizide 5 mg BID.    Normocytic anemia: probably from chronic disease Anemia panel shows adequate iron, ferrin, folate and vita min b12 levels with reticulocyte count.    Hypokalemia and hypomagnesemia:  Replaced and repeat WNL  In am.      DVT prophylaxis: HEPARIN. Code Status:  Full code Family Communication:  Discussed the plan with sister on the phone, none at bedside.  Disposition Plan: pending resolution of AKI. Possibly home tomorrow with home health PT.  Consultants:   None.   Procedures:CT abd and pelvis  Antimicrobials: none.   Subjective: Showed rash on the abdomen    Objective: Vitals:   07/17/17 2210 07/18/17 0500 07/18/17 0535 07/18/17 1616  BP: 113/65  119/72 117/80  Pulse: 68  62 75  Resp: 18  17 14   Temp: 97.6 F (36.4 C)  (!) 97.5 F (36.4 C) 98.1 F (36.7 C)  TempSrc: Oral  Oral Oral  SpO2: 99%  100% 100%  Weight:  76 kg (167 lb 8.8 oz)    Height:        Intake/Output Summary (Last 24 hours) at 07/18/2017 1659 Last data filed at 07/18/2017 1618 Gross per 24 hour  Intake 1906.67 ml  Output 1420 ml  Net 486.67 ml   Filed Weights   07/16/17 0513 07/17/17 0411 07/18/17 0500  Weight: 76.2 kg (167 lb 15.9 oz) 76.4 kg (168 lb 6.9 oz) 76 kg (167 lb 8.8 oz)    Examination:  General exam: Appears calm and comfortable no distress.  Respiratory system: good air entry bilateral. No wheezing .  Cardiovascular system: S1 & S2 heard, RRR. No JVD, murmurs, No pedal edema. Gastrointestinal system: Abdomen is soft NT nd bs+ Central nervous system: Alert and oriented. Dysarthria,  Extremities:  Symmetric 5 x 5 power. Skin: slightly raised lesions on the abdomen, non itchy  Psychiatry: Mood & affect appropriate.     Data Reviewed: I have personally reviewed following labs and imaging studies  CBC: Recent Labs  Lab 07/14/17 1626 07/15/17 2042  WBC 4.6 6.3  HGB 10.8* 10.7*  HCT 32.9* 33.0*  MCV 83 82.3  PLT 185 970   Basic Metabolic Panel: Recent Labs  Lab 07/14/17 1626 07/15/17 2042 07/16/17 0344 07/17/17 0621 07/18/17 0535  NA 135 139 140 141 141  K 3.0* 3.3* 3.0* 3.4* 3.6  CL 98 106 109 112* 112*  CO2 18* 19* 19* 22 23  GLUCOSE 439* 200* 167* 166* 212*  BUN 52* 52*  49* 32* 24*  CREATININE 4.45* 4.21* 3.75* 2.56* 2.39*  CALCIUM 8.5* 9.2 8.4* 8.7* 8.7*  MG  --   --  1.1*  --  2.2   GFR: Estimated Creatinine Clearance: 33 mL/min (A) (by C-G formula based on SCr of 2.39 mg/dL (H)). Liver Function Tests: Recent Labs  Lab 07/14/17 1626  AST 14  ALT 13  ALKPHOS 117  BILITOT 0.3  PROT 7.4  ALBUMIN 4.0   No results for input(s): LIPASE, AMYLASE in the last 168 hours. No results for input(s): AMMONIA in the last 168 hours. Coagulation Profile: No results for input(s): INR, PROTIME in the last 168 hours. Cardiac Enzymes: No results for input(s): CKTOTAL, CKMB, CKMBINDEX, TROPONINI in the last 168 hours. BNP (last 3 results) No results for input(s): PROBNP in the last 8760 hours. HbA1C: No results for input(s): HGBA1C in the last 72 hours. CBG: Recent Labs  Lab 07/17/17 1206 07/17/17 1720 07/17/17 2209 07/18/17 0832 07/18/17 1244  GLUCAP 249* 176* 294* 167* 193*   Lipid Profile: No results for input(s): CHOL, HDL, LDLCALC, TRIG, CHOLHDL, LDLDIRECT in the last 72 hours. Thyroid Function Tests: No results for input(s): TSH, T4TOTAL, FREET4, T3FREE, THYROIDAB in the last 72 hours. Anemia Panel: Recent Labs    07/16/17 0344  VITAMINB12 572  FOLATE 54.6  FERRITIN 235  TIBC 266  IRON 78  RETICCTPCT 0.8   Sepsis Labs: No results for input(s): PROCALCITON, LATICACIDVEN in the last 168 hours.  No results found for this or any previous visit (from the past 240 hour(s)).       Radiology Studies: No results found.      Scheduled Meds: . amLODipine  5 mg Oral Daily  . aspirin  81 mg Oral Daily  . clopidogrel  75 mg Oral Daily  . gabapentin  100 mg Oral TID  . heparin injection (subcutaneous)  5,000 Units Subcutaneous Q8H  . hydrocortisone cream   Topical BID  . insulin aspart  0-5 Units Subcutaneous QHS  . insulin aspart  0-9 Units Subcutaneous TID WC  . insulin aspart  2 Units Subcutaneous TID WC  . insulin glargine  12  Units Subcutaneous QHS  . multivitamin with minerals  1 tablet Oral Daily  . pantoprazole  40 mg Oral Q0600  . rosuvastatin  40 mg Oral q1800  . sertraline  25 mg Oral Daily  . traZODone  50 mg Oral QHS   Continuous Infusions: . sodium chloride 75 mL/hr at 07/18/17 1618     LOS: 2 days    Time spent: 35 minutes.     Hosie Poisson, MD [facility] Pager [phone]   If 7PM-7AM, please contact night-coverage [email or web address] Password [facility] 07/18/2017, 4:59 PM

## 2017-07-19 LAB — BASIC METABOLIC PANEL
ANION GAP: 7 (ref 5–15)
BUN: 20 mg/dL (ref 6–20)
CALCIUM: 8.5 mg/dL — AB (ref 8.9–10.3)
CO2: 25 mmol/L (ref 22–32)
Chloride: 110 mmol/L (ref 101–111)
Creatinine, Ser: 2.09 mg/dL — ABNORMAL HIGH (ref 0.61–1.24)
GFR calc Af Amer: 40 mL/min — ABNORMAL LOW (ref >60)
GFR, EST NON AFRICAN AMERICAN: 34 mL/min — AB (ref >60)
Glucose, Bld: 99 mg/dL (ref 65–99)
Potassium: 3.4 mmol/L — ABNORMAL LOW (ref 3.5–5.1)
Sodium: 142 mmol/L (ref 135–145)

## 2017-07-19 LAB — GLUCOSE, CAPILLARY
GLUCOSE-CAPILLARY: 75 mg/dL (ref 65–99)
GLUCOSE-CAPILLARY: 246 mg/dL — AB (ref 65–99)
GLUCOSE-CAPILLARY: 287 mg/dL — AB (ref 65–99)
Glucose-Capillary: 149 mg/dL — ABNORMAL HIGH (ref 65–99)

## 2017-07-19 MED ORDER — POTASSIUM CHLORIDE CRYS ER 20 MEQ PO TBCR
40.0000 meq | EXTENDED_RELEASE_TABLET | Freq: Once | ORAL | Status: AC
  Administered 2017-07-19: 40 meq via ORAL
  Filled 2017-07-19: qty 2

## 2017-07-19 MED ORDER — INSULIN GLARGINE 100 UNIT/ML ~~LOC~~ SOLN
18.0000 [IU] | Freq: Every day | SUBCUTANEOUS | Status: DC
Start: 2017-07-19 — End: 2017-07-19

## 2017-07-19 MED ORDER — TRAZODONE HCL 50 MG PO TABS: 50.0000 mg | ORAL_TABLET | Freq: Every day | ORAL | 0 refills | Status: DC

## 2017-07-19 MED ORDER — INSULIN GLARGINE 100 UNIT/ML ~~LOC~~ SOLN
12.0000 [IU] | Freq: Every day | SUBCUTANEOUS | Status: DC
  Administered 2017-07-19 – 2017-07-20 (×2): 12 [IU] via SUBCUTANEOUS
  Filled 2017-07-19 (×3): qty 0.12

## 2017-07-19 MED ORDER — GLIPIZIDE 5 MG PO TABS: 5.0000 mg | ORAL_TABLET | Freq: Two times a day (BID) | ORAL | 0 refills | Status: DC

## 2017-07-19 MED ORDER — HYDROCORTISONE 1 % EX CREA: TOPICAL_CREAM | Freq: Two times a day (BID) | CUTANEOUS | 0 refills | Status: DC

## 2017-07-19 NOTE — Care Management Note (Signed)
Case Management Note  Patient Details  Name: Eric Morton MRN: [id number] Date of Birth: Aug 11, 1965  Subjective/Objective:                 Spoke w patient at the bedside. He states he likes to go by his nick name "Eric Morton." He is very grateful for help. He states he lives at home w his sister. He has a hemi walker that when he uses he has a tendency to fall. He would like a RW, will place order. He asked to wait to call his sister later, who is at church to help assist choose [place].  Lewis,[name] Sister [phone]  [phone]      Action/Plan:  Will need RW requested to be delivered to room by [facility]. Will need referral to [facility] agency. Expected Discharge Date:  07/19/17               Expected Discharge Plan:  [place]  In-House Referral:     Discharge planning Services  CM Consult  Post Acute Care Choice:  Home Health, Durable Medical Equipment Choice offered to:  Patient  DME Arranged:  Walker rolling DME Agency:  [place]:    [facility] Agency:     Status of Service:  In process, will continue to follow  If discussed at Long Length of Stay Meetings, dates discussed:    Additional Comments:  Carles Collet, RN 07/19/2017, 2:03 PM

## 2017-07-19 NOTE — Progress Notes (Signed)
PROGRESS NOTE    Eric Morton  YQI:[id number] DOB: [birth year] DOA: 07/15/2017 PCP: Eric Pounds, NP    Brief Narrative: Eric Morton a 52 y.o.malewith medical history significant forpoorly controlled insulin-dependent diabetes mellitus, hypertension, and history of CVA with residual deficits, now presenting to the emergency department at the direction of his PCP for evaluation of elevated creatinine on his outpatient blood work. His baseline creatinine is around 1 and he was found to have creatinine of 4 . He was admitted for AKI. CT abd does not show any hydronephrosis.    Assessment & Plan:   Principal Problem:   Renal insufficiency Active Problems:   Essential hypertension   Type II diabetes mellitus (HCC)   History of CVA (cerebrovascular accident)   Normocytic anemia   Hypokalemia   AKI: Prerenal vs ATN.  CT abd does not reveal any hydronephrosis.  Improving with hydration. Creatinine slowly improving, its still at 2 today, recommend one more day of IV fluids and possible d/c home tomorrow.  Discontinued metformin.  Avoid NSAIDS.  Adequate urine output.    Hypertension: well controlled. Stopping the lisinopril and HCTZ.  Can be resumed as outpt once creatinine is back to baseline.    H/o CVA with residual speech deficits Currently on aspirin and plavix.  Get PT evaluation as pt's sister reports difficulty ambulating at home due to his hemiparesis.  Will order home health PT and OT.     Uncontrolled DM with A1c of 13.  CBG (last 3)  Recent Labs    07/18/17 1701 07/18/17 2058 07/19/17 0744  GLUCAP 260* 363* 94   Stopped the metformin due to AKI,  He is at home on 20 units of lantus, currently at 12 units of lantus.  Started glipizide 5 mg BID.     Normocytic anemia: probably from chronic disease Anemia panel shows adequate iron, ferrin, folate and vita min b12 levels with reticulocyte count.    Hypokalemia and hypomagnesemia:  Replaced and  repeat tomorrow.     DVT prophylaxis: HEPARIN. Code Status:  Full code Family Communication:  Discussed the plan with sister on the phone, none at bedside.  Disposition Plan: pending resolution of AKI. Possibly home tomorrow with home health PT.  Consultants:   None.   Procedures:CT abd and pelvis  Antimicrobials: none.   Subjective: Rash is better, he denies any chest pain or sob.    Objective: Vitals:   07/18/17 1616 07/18/17 2120 07/19/17 0500 07/19/17 0551  BP: 117/80 104/73  118/73  Pulse: 75 76  64  Resp: 14 16  12   Temp: 98.1 F (36.7 C) 97.7 F (36.5 C)  97.9 F (36.6 C)  TempSrc: Oral     SpO2: 100% 96%  100%  Weight:   74.3 kg (163 lb 12.8 oz)   Height:        Intake/Output Summary (Last 24 hours) at 07/19/2017 1207 Last data filed at 07/19/2017 1100 Gross per 24 hour  Intake 2481.31 ml  Output 2620 ml  Net -138.69 ml   Filed Weights   07/17/17 0411 07/18/17 0500 07/19/17 0500  Weight: 76.4 kg (168 lb 6.9 oz) 76 kg (167 lb 8.8 oz) 74.3 kg (163 lb 12.8 oz)    Examination:  General exam: Appears calm, no distress.  Respiratory system: air entry bilateral fair, no wheezing or rhonchi.  Cardiovascular system: S1 & S2 heard, RRR. No JVD, murmurs, No pedal edema. Gastrointestinal system: Abdomen is soft NON tender non distended bowel sounds  good.  Central nervous system: Alert and oriented. Dysarthria,  Extremities: Symmetric 5 x 5 power. Skin: slightly raised lesions on the abdomen, non itchy  Psychiatry: Mood & affect appropriate.     Data Reviewed: I have personally reviewed following labs and imaging studies  CBC: Recent Labs  Lab 07/14/17 1626 07/15/17 2042  WBC 4.6 6.3  HGB 10.8* 10.7*  HCT 32.9* 33.0*  MCV 83 82.3  PLT 185 093   Basic Metabolic Panel: Recent Labs  Lab 07/15/17 2042 07/16/17 0344 07/17/17 0621 07/18/17 0535 07/19/17 0929  NA 139 140 141 141 142  K 3.3* 3.0* 3.4* 3.6 3.4*  CL 106 109 112* 112* 110  CO2 19*  19* 22 23 25   GLUCOSE 200* 167* 166* 212* 99  BUN 52* 49* 32* 24* 20  CREATININE 4.21* 3.75* 2.56* 2.39* 2.09*  CALCIUM 9.2 8.4* 8.7* 8.7* 8.5*  MG  --  1.1*  --  2.2  --    GFR: Estimated Creatinine Clearance: 37.8 mL/min (A) (by C-G formula based on SCr of 2.09 mg/dL (H)). Liver Function Tests: Recent Labs  Lab 07/14/17 1626  AST 14  ALT 13  ALKPHOS 117  BILITOT 0.3  PROT 7.4  ALBUMIN 4.0   No results for input(s): LIPASE, AMYLASE in the last 168 hours. No results for input(s): AMMONIA in the last 168 hours. Coagulation Profile: No results for input(s): INR, PROTIME in the last 168 hours. Cardiac Enzymes: No results for input(s): CKTOTAL, CKMB, CKMBINDEX, TROPONINI in the last 168 hours. BNP (last 3 results) No results for input(s): PROBNP in the last 8760 hours. HbA1C: No results for input(s): HGBA1C in the last 72 hours. CBG: Recent Labs  Lab 07/18/17 0832 07/18/17 1244 07/18/17 1701 07/18/17 2058 07/19/17 0744  GLUCAP 167* 193* 260* 363* 75   Lipid Profile: No results for input(s): CHOL, HDL, LDLCALC, TRIG, CHOLHDL, LDLDIRECT in the last 72 hours. Thyroid Function Tests: No results for input(s): TSH, T4TOTAL, FREET4, T3FREE, THYROIDAB in the last 72 hours. Anemia Panel: No results for input(s): VITAMINB12, FOLATE, FERRITIN, TIBC, IRON, RETICCTPCT in the last 72 hours. Sepsis Labs: No results for input(s): PROCALCITON, LATICACIDVEN in the last 168 hours.  No results found for this or any previous visit (from the past 240 hour(s)).       Radiology Studies: No results found.      Scheduled Meds: . amLODipine  5 mg Oral Daily  . aspirin  81 mg Oral Daily  . clopidogrel  75 mg Oral Daily  . gabapentin  100 mg Oral TID  . glipiZIDE  5 mg Oral BID AC  . heparin injection (subcutaneous)  5,000 Units Subcutaneous Q8H  . hydrocortisone cream   Topical BID  . insulin aspart  0-5 Units Subcutaneous QHS  . insulin aspart  0-9 Units Subcutaneous TID WC    . insulin aspart  2 Units Subcutaneous TID WC  . insulin glargine  12 Units Subcutaneous QHS  . multivitamin with minerals  1 tablet Oral Daily  . pantoprazole  40 mg Oral Q0600  . potassium chloride  40 mEq Oral Once  . rosuvastatin  40 mg Oral q1800  . sertraline  25 mg Oral Daily  . traZODone  50 mg Oral QHS   Continuous Infusions: . sodium chloride 75 mL/hr at 07/18/17 2241     LOS: 3 days    Time spent: 35 minutes.     Hosie Poisson, MD [facility] Pager [phone]   If 7PM-7AM, please contact  night-coverage [email or web address] Password [facility] 07/19/2017, 12:07 PM

## 2017-07-20 ENCOUNTER — Telehealth: Payer: Self-pay

## 2017-07-20 LAB — GLUCOSE, CAPILLARY
GLUCOSE-CAPILLARY: 240 mg/dL — AB (ref 65–99)
GLUCOSE-CAPILLARY: 318 mg/dL — AB (ref 65–99)
Glucose-Capillary: 148 mg/dL — ABNORMAL HIGH (ref 65–99)
Glucose-Capillary: 77 mg/dL (ref 65–99)

## 2017-07-20 LAB — CREATININE, SERUM
CREATININE: 1.7 mg/dL — AB (ref 0.61–1.24)
GFR calc Af Amer: 51 mL/min — ABNORMAL LOW (ref >60)
GFR, EST NON AFRICAN AMERICAN: 44 mL/min — AB (ref >60)

## 2017-07-20 NOTE — Discharge Summary (Addendum)
Physician Discharge Summary  Eric Morton ZPH:[id number] DOB: Jan 05, 1966 DOA: 07/15/2017  PCP: [name], NP  Admit date: 07/15/2017 Discharge date: 07/21/2017  Admitted From: Home Disposition:  SNF. Recommendations for Outpatient Follow-up:  1. Follow up with PCP in 1-2 weeks 2. Please obtain BMP/CBC in one week  Discharge Condition:stable.  CODE STATUS: full code.  Diet recommendation: Heart Healthy  Brief/Interim Summary: Eric Morton a 52 y.o.malewith medical history significant forpoorly controlled insulin-dependent diabetes mellitus, hypertension, and history of CVA with residual deficits, now presenting to the emergency department at the direction of his PCP for evaluation of elevated creatinine on his outpatient blood work.His baseline creatinine is around 1 and he was found to have creatinine of 4 . He was admitted for AKI. CT abd does not show any hydronephrosis.     Discharge Diagnoses:  Principal Problem:   Renal insufficiency Active Problems:   Essential hypertension   Type II diabetes mellitus (HCC)   History of CVA (cerebrovascular accident)   Normocytic anemia   Hypokalemia   AKI: Prerenal vs ATN.  CT abd does not reveal any hydronephrosis.  Improving with hydration. Creatinine slowly improving, its at 1.5 today Discontinued metformin.  Avoid NSAIDS.  Adequate urine output.    Hypertension: well controlled. Stopping the lisinopril and HCTZ.  Can be resumed as outpt once creatinine is back to baseline.    H/o CVA with residual speech deficits Currently on aspirin and plavix.  Get PT evaluation as pt's sister reports difficulty ambulating at home due to his hemiparesis.  Plan for SNF on discharge.      Uncontrolled DM with A1c of 13.  CBG (last 3)  Recent Labs    07/19/17 2242 07/20/17 0743 07/20/17 1230  GLUCAP 287* 148* 78     Stopped the metformin due to AKI,  He is at home on 20 units of lantus , resume the lantus  and  Started glipizide 5 mg BID.     Normocytic anemia: probably from chronic disease Anemia panel shows adequate iron, ferrin, folate and vita min b12 levels with reticulocyte count.    Hypokalemia and hypomagnesemia:  Replaced    Constipation;  Added senna, colace and miralax.       Discharge Instructions  Discharge Instructions    Diet - low sodium heart healthy   Complete by:  As directed    Diet - low sodium heart healthy   Complete by:  As directed    Discharge instructions   Complete by:  As directed    PLEASE follow up with PCP in one week and check renal function tests at that time.   Discharge instructions   Complete by:  As directed    Please follow up with PCP in one week.  Please check BMP in 2 to 3 days.     Allergies as of 07/20/2017      Reactions   Lipitor [atorvastatin] Other (See Comments)   Sister states had to have Cardiac Surgery      Medication List    STOP taking these medications   lisinopril-hydrochlorothiazide 20-25 MG tablet Commonly known as:  PRINZIDE,ZESTORETIC   metFORMIN 1000 MG tablet Commonly known as:  GLUCOPHAGE     TAKE these medications   amLODipine 5 MG tablet Commonly known as:  NORVASC Take 1 tablet (5 mg total) by mouth daily.   aspirin 81 MG chewable tablet Commonly known as:  ASPIRIN CHILDRENS Chew 1 tablet (81 mg total) by mouth daily.  clopidogrel 75 MG tablet Commonly known as:  PLAVIX Take 1 tablet (75 mg total) by mouth daily.   folic acid 1 MG tablet Commonly known as:  FOLVITE TAKE 1 TABLET BY MOUTH DAILY   gabapentin 300 MG capsule Commonly known as:  NEURONTIN Take 1 capsule (300 mg total) by mouth 3 (three) times daily.   glipiZIDE 5 MG tablet Commonly known as:  GLUCOTROL Take 1 tablet (5 mg total) by mouth 2 (two) times daily before a meal. What changed:    medication strength  how much to take  when to take this   glucose blood test strip Commonly known as:  TRUE METRIX  BLOOD GLUCOSE TEST 1 each by Other route 3 (three) times daily.   hydrocortisone cream 1 % Apply topically 2 (two) times daily.   Insulin Glargine 100 UNIT/ML Solostar Pen Commonly known as:  LANTUS Inject 20 Units into the skin daily at 10 pm.   Insulin Pen Needle 31G X 5 MM Misc Commonly known as:  B-D UF III MINI PEN NEEDLES Use as instructed   lidocaine 5 % ointment Commonly known as:  XYLOCAINE Apply 1 application topically as needed for mild pain (shoulder).   multivitamin with minerals Tabs tablet Take 1 tablet by mouth daily.   pantoprazole 40 MG tablet Commonly known as:  PROTONIX Take 1 tablet (40 mg total) by mouth daily at 6 (six) AM.   rosuvastatin 40 MG tablet Commonly known as:  CRESTOR Take 1 tablet (40 mg total) by mouth daily at 6 PM.   sertraline 25 MG tablet Commonly known as:  ZOLOFT Take 1 tablet (25 mg total) by mouth daily.   traZODone 50 MG tablet Commonly known as:  DESYREL Take 1 tablet (50 mg total) by mouth at bedtime.   TRUE METRIX GO GLUCOSE METER w/Device Kit USE AS DIRECTED   TRUEPLUS LANCETS 28G Misc 1 each by Does not apply route 3 (three) times daily.            Durable Medical Equipment  (From admission, onward)        Start     Ordered   07/19/17 1407  For home use only DME Walker rolling  Once    Question:  Patient needs a walker to treat with the following condition  Answer:  Weakness   07/19/17 1406     Follow-up Information    [name], NP. Go on 08/04/2017.   Specialty:  Nurse Practitioner Why:   08/04/2017  at 9:10 am, hospital follow up (check BMP at the office visit to check creatinine)   Contact information: [place] [address] [phone]        Corrie Mckusick, DO. Schedule an appointment as soon as possible for a visit in 1 week(s).   Specialties:  Interventional Radiology, Radiology Contact information: [address] [place]  [address] [phone]          Allergies  Allergen Reactions  . Lipitor [Atorvastatin] Other (See Comments)    Sister states had to have Cardiac Surgery    Consultations:  None.    Procedures/Studies: Ct Abdomen Pelvis Wo Contrast  Result Date: 07/16/2017 CLINICAL DATA:  52 y/o  M; nausea, vomiting, and lower back pain. EXAM: CT ABDOMEN AND PELVIS WITHOUT CONTRAST TECHNIQUE: Multidetector CT imaging of the abdomen and pelvis was performed following the standard protocol without IV contrast. COMPARISON:  04/25/2014 CT abdomen and pelvis FINDINGS: Lower chest: No acute  abnormality. Hepatobiliary: No focal liver abnormality is seen. No gallstones, gallbladder wall thickening, or biliary dilatation. Pancreas: Unremarkable. No pancreatic ductal dilatation or surrounding inflammatory changes. Spleen: Normal in size without focal abnormality. Adrenals/Urinary Tract: Adrenal glands are unremarkable. Kidneys are normal, without renal calculi, focal lesion, or hydronephrosis. Bladder is unremarkable. Stomach/Bowel: Rugal thickening of the nondistended proximal gastric body and cardia. Appendix appears normal. No evidence of bowel wall thickening, distention, or inflammatory changes. Vascular/Lymphatic: Aortic atherosclerosis. No enlarged abdominal or pelvic lymph nodes. Reproductive: Prostate is unremarkable. Other: No abdominal wall hernia or abnormality. No abdominopelvic ascites. Musculoskeletal: No fracture is seen. IMPRESSION: Rugal thickening of the nondistended proximal gastric body and cardia may represent gastritis. No findings of perforation or abscess. Aortic atherosclerosis. Electronically Signed   By: Kristine Garbe M.D.   On: 07/16/2017 01:58       Subjective: No new complaints.   Discharge Exam: Vitals:   07/20/17 0506 07/20/17 1438  BP: 126/74 119/69  Pulse: 66 79  Resp: 16 15  Temp: 97.9 F (36.6 C) 97.8 F (36.6 C)  SpO2: 100% 100%   Vitals:   07/19/17 0551  07/19/17 1626 07/20/17 0506 07/20/17 1438  BP: 118/73 (!) 150/84 126/74 119/69  Pulse: 64 75 66 79  Resp: _0 Temp: 97.9 F (36.6 C) 98.2 F (36.8 C) 97.9 F (36.6 C) 97.8 F (36.6 C)  TempSrc:  Oral Oral Oral  SpO2: 100% 100% 100% 100%  Weight:   79.8 kg (175 lb 14.8 oz)   Height:        General: Pt is alert, awake, not in acute distress Cardiovascular: RRR, S1/S2 +, no rubs, no gallops Respiratory: CTA bilaterally, no wheezing, no rhonchi Abdominal: Soft, NT, ND, bowel sounds + Extremities: no edema, no cyanosis    The results of significant diagnostics from this hospitalization (including imaging, microbiology, ancillary and laboratory) are listed below for reference.     Microbiology: No results found for this or any previous visit (from the past 240 hour(s)).   Labs: BNP (last 3 results) No results for input(s): BNP in the last 8760 hours. Basic Metabolic Panel: Recent Labs  Lab 07/15/17 2042 07/16/17 0344 07/17/17 0621 07/18/17 0535 07/19/17 0929 07/20/17 0747  NA 139 140 141 141 142  --   K 3.3* 3.0* 3.4* 3.6 3.4*  --   CL 106 109 112* 112* 110  --   CO2 19* 19* _1 --   GLUCOSE 200* 167* 166* 212* 99  --   BUN 52* 49* 32* 24* 20  --   CREATININE 4.21* 3.75* 2.56* 2.39* 2.09* 1.70*  CALCIUM 9.2 8.4* 8.7* 8.7* 8.5*  --   MG  --  1.1*  --  2.2  --   --    Liver Function Tests: Recent Labs  Lab 07/14/17 1626  AST 14  ALT 13  ALKPHOS 117  BILITOT 0.3  PROT 7.4  ALBUMIN 4.0   No results for input(s): LIPASE, AMYLASE in the last 168 hours. No results for input(s): AMMONIA in the last 168 hours. CBC: Recent Labs  Lab 07/14/17 1626 07/15/17 2042  WBC 4.6 6.3  HGB 10.8* 10.7*  HCT 32.9* 33.0*  MCV 83 82.3  PLT 185 196   Cardiac Enzymes: No results for input(s): CKTOTAL, CKMB, CKMBINDEX, TROPONINI in the last 168 hours. BNP: Invalid input(s): POCBNP CBG: Recent Labs  Lab 07/19/17 1317 07/19/17 1637 07/19/17 2242  07/20/17 0743 07/20/17 1230  GLUCAP 149* 246*  287* 148* 77   D-Dimer No results for input(s): DDIMER in the last 72 hours. Hgb A1c No results for input(s): HGBA1C in the last 72 hours. Lipid Profile No results for input(s): CHOL, HDL, LDLCALC, TRIG, CHOLHDL, LDLDIRECT in the last 72 hours. Thyroid function studies No results for input(s): TSH, T4TOTAL, T3FREE, THYROIDAB in the last 72 hours.  Invalid input(s): FREET3 Anemia work up No results for input(s): VITAMINB12, FOLATE, FERRITIN, TIBC, IRON, RETICCTPCT in the last 72 hours. Urinalysis    Component Value Date/Time   COLORURINE YELLOW 07/16/2017 0043   APPEARANCEUR CLEAR 07/16/2017 0043   LABSPEC 1.012 07/16/2017 0043   PHURINE 5.0 07/16/2017 0043   GLUCOSEU 50 (A) 07/16/2017 0043   HGBUR MODERATE (A) 07/16/2017 0043   BILIRUBINUR NEGATIVE 07/16/2017 0043   BILIRUBINUR Negative 07/14/2017 1621   KETONESUR NEGATIVE 07/16/2017 0043   PROTEINUR 100 (A) 07/16/2017 0043   UROBILINOGEN 0.2 07/14/2017 1621   UROBILINOGEN 1.0 04/25/2014 1751   NITRITE NEGATIVE 07/16/2017 0043   LEUKOCYTESUR NEGATIVE 07/16/2017 0043   Sepsis Labs Invalid input(s): PROCALCITONIN,  WBC,  LACTICIDVEN Microbiology No results found for this or any previous visit (from the past 240 hour(s)).   Time coordinating discharge: 35 minutes.    SIGNED:   Hosie Poisson, MD  [facility] 07/20/2017, 3:03 PM Pager   If 7PM-7AM, please contact night-coverage [email or web address] Password [email or web address]

## 2017-07-20 NOTE — NC FL2 (Signed)
[place] LEVEL OF CARE SCREENING TOOL     IDENTIFICATION  Patient Name: Eric Morton Birthdate: 27-Jan-1966 Sex: male Admission Date (Current Location): 07/15/2017  [facility] and [place] Number:  Herbalist and Address:  [facility], [place] [address], [place], [place] [address]      Provider Number: [id number]  Attending Physician Name and Address:  Hosie Poisson, MD  Relative Name and Phone Number:       Current Level of Care: Hospital Recommended Level of Care: [place] Prior Approval Number:    Date Approved/Denied:   PASRR Number:    Discharge Plan: SNF    Current Diagnoses: Patient Active Problem List   Diagnosis Date Noted  . Renal insufficiency 07/16/2017  . Normocytic anemia 07/16/2017  . Hypokalemia 07/16/2017  . Dysphagia, post-stroke   . Hemiparesis affecting right side as late effect of stroke ([place]) 10/29/2016  . Dysarthria due to recent stroke   . Left-sided weakness 10/23/2016  . Aphasia   . Hyperlipidemia associated with type 2 diabetes mellitus ([place]) 07/27/2015  . History of CVA (cerebrovascular accident)   . Polycythemia vera ([place]) 03/21/2015  . Hx of adenomatous colonic polyps   . Dehydration   . Diarrhea   . Cellulitis of breast of male 09/25/2011  . Essential hypertension 09/25/2011  . Type II diabetes mellitus ([place]) 09/25/2011  . Alcohol abuse 09/25/2011  . Cocaine abuse ([place]) 09/25/2011  . Tobacco use 09/25/2011  . Neuropathy 09/25/2011    Orientation RESPIRATION BLADDER Height & Weight     Self, Situation, Time, Place  Normal Continent, External catheter Weight: 175 lb 14.8 oz (79.8 kg) Height:  5\' 7"  (170.2 cm)  BEHAVIORAL SYMPTOMS/MOOD NEUROLOGICAL BOWEL NUTRITION STATUS    (History of CVA) Continent Diet(Heart healthy/carb modified)  AMBULATORY STATUS COMMUNICATION OF NEEDS Skin   Limited Assist Verbally Other (Comment)(Rash)                       Personal  Care Assistance Level of Assistance  Bathing, Feeding, Dressing Bathing Assistance: Limited assistance Feeding assistance: Independent Dressing Assistance: Limited assistance     Functional Limitations Info  Sight, Hearing, Speech Sight Info: Adequate Hearing Info: Adequate Speech Info: Adequate    SPECIAL CARE FACTORS FREQUENCY  PT (By licensed PT), Blood pressure, OT (By licensed OT)     PT Frequency: 3 x week OT Frequency: 3 x week            Contractures Contractures Info: Not present    Additional Factors Info  Code Status, Allergies Code Status Info: Full Allergies Info: Lipitor (Atorvastatin)           Current Medications (07/20/2017):  This is the current hospital active medication list Current Facility-Administered Medications  Medication Dose Route Frequency Provider Last Rate Last Dose  . 0.9 %  sodium chloride infusion   Intravenous Continuous Hosie Poisson, MD 75 mL/hr at 07/20/17 0319    . acetaminophen (TYLENOL) tablet 650 mg  650 mg Oral Q6H PRN Opyd, Ilene Qua, MD       Or  . acetaminophen (TYLENOL) suppository 650 mg  650 mg Rectal Q6H PRN Opyd, Ilene Qua, MD      . amLODipine (NORVASC) tablet 5 mg  5 mg Oral Daily Opyd, Ilene Qua, MD   5 mg at 07/20/17 0817  . aspirin chewable tablet 81 mg  81 mg Oral Daily Opyd, Ilene Qua, MD   [phone]  mg at 07/20/17 0816  . clopidogrel (PLAVIX) tablet 75 mg  75 mg Oral Daily Opyd, Ilene Qua, MD   75 mg at 07/20/17 0817  . gabapentin (NEURONTIN) capsule 100 mg  100 mg Oral TID Vianne Bulls, MD   100 mg at 07/20/17 0816  . glipiZIDE (GLUCOTROL) tablet 5 mg  5 mg Oral BID AC Hosie Poisson, MD   5 mg at 07/20/17 0816  . heparin injection 5,000 Units  5,000 Units Subcutaneous Q8H Hosie Poisson, MD   5,000 Units at 07/20/17 1306  . HYDROcodone-acetaminophen (NORCO/VICODIN) 5-325 MG per tablet 1-2 tablet  1-2 tablet Oral Q4H PRN Opyd, Ilene Qua, MD   2 tablet at 07/17/17 1801  . hydrocortisone cream 1 %   Topical BID Hosie Poisson, MD      . insulin aspart (novoLOG) injection 0-5 Units  0-5 Units Subcutaneous QHS Opyd, Ilene Qua, MD   3 Units at 07/19/17 2255  . insulin aspart (novoLOG) injection 0-9 Units  0-9 Units Subcutaneous TID WC Opyd, Ilene Qua, MD   1 Units at 07/20/17 0816  . insulin glargine (LANTUS) injection 12 Units  12 Units Subcutaneous QHS Hosie Poisson, MD   12 Units at 07/19/17 2259  . LORazepam (ATIVAN) tablet 0.5 mg  0.5 mg Oral Daily PRN Hosie Poisson, MD      . multivitamin with minerals tablet 1 tablet  1 tablet Oral Daily Opyd, Ilene Qua, MD   1 tablet at 07/20/17 0817  . ondansetron (ZOFRAN) tablet 4 mg  4 mg Oral Q6H PRN Opyd, Ilene Qua, MD       Or  . ondansetron (ZOFRAN) injection 4 mg  4 mg Intravenous Q6H PRN Opyd, Ilene Qua, MD   4 mg at 07/17/17 0908  . pantoprazole (PROTONIX) EC tablet 40 mg  40 mg Oral Q0600 Vianne Bulls, MD   40 mg at 07/20/17 0543  . rosuvastatin (CRESTOR) tablet 40 mg  40 mg Oral q1800 Opyd, Ilene Qua, MD   40 mg at 07/19/17 1814  . senna-docusate (Senokot-S) tablet 1 tablet  1 tablet Oral QHS PRN Opyd, Ilene Qua, MD      . sertraline (ZOLOFT) tablet 25 mg  25 mg Oral Daily Opyd, Ilene Qua, MD   25 mg at 07/20/17 0816  . traZODone (DESYREL) tablet 50 mg  50 mg Oral QHS Hosie Poisson, MD   50 mg at 07/19/17 2300     Discharge Medications: Please see discharge summary for a list of discharge medications.  Relevant Imaging Results:  Relevant Lab Results:   Additional Information SS#: [id number]  Candie Chroman, LCSW

## 2017-07-20 NOTE — Progress Notes (Signed)
NCM spoke with sister, [name], regarding d/c plan, home with home health services. Sister states she is unable to care for pt or provide 24/7 supervision. Sister states she has to work. Home health services would not help with pt's circumstance. States if left home alone he wouldn't be able to open the door. States she has lost her car/ transportation as a result in caring for her brother, very frustrated with situation. MD made aware. CSW made aware of SNF placement needed. Whitman Hero RN,BSN,CM

## 2017-07-20 NOTE — Clinical Social Work Note (Signed)
Clinical Social Work Assessment  Patient Details  Name: Eric Morton MRN: [id number] Date of Birth: 1965/10/17  Date of referral:  07/20/17               Reason for consult:  Facility Placement, Discharge Planning                Permission sought to share information with:  Chartered certified accountant granted to share information::  Yes, Verbal Permission Granted  Name::        Agency::  SNF's  Relationship::     Contact Information:     Housing/Transportation Living arrangements for the past 2 months:  Apartment Source of Information:  Patient, Medical Team Patient Interpreter Needed:  None Criminal Activity/Legal Involvement Pertinent to Current Situation/Hospitalization:  No - Comment as needed Significant Relationships:  Siblings, Other Family Members Lives with:  Siblings Do you feel safe going back to the place where you live?  Yes Need for family participation in patient care:  Yes (Comment)  Care giving concerns:  PT recommending HHPT but sister will not allow patient to return home.   Social Worker assessment / plan:  CSW met with patient. No supports at bedside. CSW introduced role and explained that discharge planning would be discussed. Patient stated he was not aware his sister will not let him return home. Discussed SNF placement. Patient is agreeable but wants to stay in [facility]. CSW informed him that he has to stay a minimum of 30 days for Medicaid to pay and his Medicaid check minus $30 will go directly to the facility. Patient stated his Medicaid check is his only income. It also helps to pay sister's rent. Patient stated he wants to see what his SNF options are. He is also going to see if a cousin will let him stay with him. Patient asked what would happen if he did not go to a facility . CSW explained that shelter resources would be provided. Patient not interested in going to a shelter. No further concerns. CSW encouraged patient to contact CSW  as needed. CSW will continue to follow patient for support and facilitate discharge to SNF once bed available. Also information on [place] [facility] does not match to look up [facility]. Will follow up tomorrow.  Employment status:  Unemployed Forensic scientist:  Medicaid In [place] PT Recommendations:  Home with [place] / Referral to community resources:  [place]  Patient/Family's Response to care:  Patient agreeable to looking into SNF. Patient's support system unknown at this point since sister will not let him return home. Patient appreciated social work intervention.  Patient/Family's Understanding of and Emotional Response to Diagnosis, Current Treatment, and Prognosis:  Patient has a good understanding of the reason for admission and social work consult. Patient appears happy with hospital care.  Emotional Assessment Appearance:  Appears stated age Attitude/Demeanor/Rapport:  Engaged, Gracious Affect (typically observed):  Accepting, Appropriate, Calm, Pleasant Orientation:  Oriented to Self, Oriented to Place, Oriented to  Time, Oriented to Situation Alcohol / Substance use:  Illicit Drugs, Tobacco Use Psych involvement (Current and /or in the community):  No (Comment)  Discharge Needs  Concerns to be addressed:  Care Coordination Readmission within the last 30 days:  No Current discharge risk:  Homeless Barriers to Discharge:  Inadequate or no insurance, Programmer, applications ([place])   Candie Chroman, LCSW 07/20/2017, 4:54 PM

## 2017-07-20 NOTE — Progress Notes (Signed)
Occupational Therapy Treatment Patient Details Name: Eric Morton MRN: [id number] DOB: 12-01-1965 Today's Date: 07/20/2017    History of present illness Pt is a 52 y/o male admitted secondary to renal insuffienciency. 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    OT comments  Pt progressing well. Pt states he had a fall @ 3 months ago and injured his R shoulder. Pt has limited AROM and complains of pain anterior aspect of shoulder in addition to point tenderness over AC joint. Recommend follow up with orthopedic MD to further assess shoulder. Recommend HHOT. Pt needs a RW to DC home safely. Recommend S with mobility at this time.   Follow Up Recommendations  Home health OT;Supervision/Assistance - 24 hour(S for mobility)    Equipment Recommendations  Other (comment)(RW)    Recommendations for Other Services  Orthopedic MD consult as an outpt for R Shoulder pain s/p fall    Precautions / Restrictions Precautions Precautions: Fall Restrictions Weight Bearing Restrictions: No       Mobility Bed Mobility Overal bed mobility: Modified Independent Bed Mobility: Supine to Sit;Sit to Sidelying     Supine to sit: Supervision   Sit to sidelying: Supervision General bed mobility comments: use of rail  Transfers Overall transfer level: Needs assistance Equipment used: Rolling walker (2 wheeled) Transfers: Stand Pivot Transfers;Sit to/from Stand Sit to Stand: Supervision Stand pivot transfers: Min guard       General transfer comment: cues for safe hand placement    Balance Overall balance assessment: Needs assistance;History of Falls Sitting-balance support: No upper extremity supported;Feet supported Sitting balance-Leahy Scale: Good     Standing balance support: Bilateral upper extremity supported;During functional activity Standing balance-Leahy Scale: Poor                             ADL either performed or assessed with  clinical judgement   ADL Overall ADL's : Needs assistance/impaired                                     Functional mobility during ADLs: Min guard;Rolling walker       Vision   Additional Comments: visual impairment from CVA; Primarily uses vision from L eye; planned surgery on R eye   Perception     Praxis      Cognition Arousal/Alertness: Awake/alert Behavior During Therapy: WFL for tasks assessed/performed Overall Cognitive Status: History of cognitive impairments - at baseline                                 General Comments: slow processing        Exercises Exercises: Other exercises Other Exercises Other Exercises: Educated on use of ice for R shoulder Other Exercises: AAROM shoulder FF in supine as tolerated wtih scapula depressed   Shoulder Instructions       General Comments      Pertinent Vitals/ Pain       Pain Assessment: Faces Faces Pain Scale: Hurts even more Pain Location: left lower back; R shoulder Pain Descriptors / Indicators: Aching;Sharp Pain Intervention(s): Limited activity within patient's tolerance  Home Living  Prior Functioning/Environment              Frequency  Min 2X/week        Progress Toward Goals  OT Goals(current goals can now be found in the care plan section)  Progress towards OT goals: Progressing toward goals  Acute Rehab OT Goals Patient Stated Goal: to go home  OT Goal Formulation: With patient Time For Goal Achievement: 07/25/17 Potential to Achieve Goals: Good ADL Goals Pt Will Perform Grooming: standing;with min guard assist Pt Will Perform Lower Body Dressing: with min guard assist;sit to/from stand Pt Will Transfer to Toilet: with supervision;with set-up;ambulating;bedside commode Pt Will Perform Toileting - Clothing Manipulation and hygiene: with set-up;with supervision;sit to/from stand  Plan Discharge plan  remains appropriate    Co-evaluation                 AM-PAC PT "6 Clicks" Daily Activity     Outcome Measure   Help from another person eating meals?: None Help from another person taking care of personal grooming?: A Little Help from another person toileting, which includes using toliet, bedpan, or urinal?: A Little Help from another person bathing (including washing, rinsing, drying)?: A Little Help from another person to put on and taking off regular upper body clothing?: A Little Help from another person to put on and taking off regular lower body clothing?: A Little 6 Click Score: 19    End of Session Equipment Utilized During Treatment: Gait belt;Rolling walker  OT Visit Diagnosis: Unsteadiness on feet (R26.81)   Activity Tolerance Patient tolerated treatment well   Patient Left in bed;with call bell/phone within reach;with bed alarm set   Nurse Communication Mobility status;Other (comment)(DC needs)        Time: 6283-1517 OT Time Calculation (min): 23 min  Charges: OT General Charges $OT Visit: 1 Visit OT Treatments $Self Care/Home Management : 23-37 mins  Maurie Boettcher, OT/L  OT Clinical Specialist [phone]    [facility] 07/20/2017, 12:22 PM

## 2017-07-20 NOTE — Telephone Encounter (Signed)
Call received from the patient's sister, [name]. She explained that she now wants to have the patient placed in a facility as they are planning to discharge him home.  She said that she needs to work and take care of herself.  She was not interested in the in-home care any longer. She said that the home health will only be short term and the PCS hours were decreased from 61 to 59 hours/ month and that is not enough. She said that she had not spoken to anyone at the hospital about her decision. Instructed her to call the hospital and informed her that this CM would contact the hospital CM.  Whitman Hero, RN CM informed of the sister's desire to have the patient placed at this time as she does not want him to come home.

## 2017-07-20 NOTE — Progress Notes (Signed)
Physical Therapy Treatment Patient Details Name: Eric Morton MRN: [id number] DOB: 1965/03/17 Today's Date: 07/20/2017    History of Present Illness Pt is a 52 y/o male admitted secondary to renal insuffienciency. 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     PT Comments    Patient required supervision/min guard assist for mobility this session. Pt limited by L side low back pain which he reports began about 3 weeks ago. Pt will continue to benefit from further skilled PT services to maximize independence and safety with mobility.     Follow Up Recommendations  Home health PT;Supervision for mobility/OOB     Equipment Recommendations  Rolling walker with 5" wheels    Recommendations for Other Services       Precautions / Restrictions Precautions Precautions: Fall Restrictions Weight Bearing Restrictions: No    Mobility  Bed Mobility Overal bed mobility: Needs Assistance Bed Mobility: Supine to Sit;Sit to Sidelying     Supine to sit: Supervision   Sit to sidelying: Supervision General bed mobility comments: use of rail  Transfers Overall transfer level: Needs assistance Equipment used: Rolling walker (2 wheeled) Transfers: Sit to/from Stand Sit to Stand: Min guard         General transfer comment: cues for safe hand placement  Ambulation/Gait Ambulation/Gait assistance: Min guard Gait Distance (Feet): 180 Feet Assistive device: Rolling walker (2 wheeled) Gait Pattern/deviations: Step-through pattern;Decreased stride length;Decreased step length - right;Decreased step length - left Gait velocity: Decreased    General Gait Details: min guard for safety; directional cues required due to visual deficits of R eye; cues for bilat step lengths   Stairs Stairs: Yes Stairs assistance: Min guard Stair Management: One rail Right;Sideways;Step to pattern Number of Stairs: 5 General stair comments: limited by IV line; pt required  seated break on stairs due to low back pain; pt educated to ascend/descend seated if needed at home do limit risk of falling down stairs and to have sister present when negotiating steps; pt is able to ascend/descend with rail and min guard    Wheelchair Mobility    Modified Rankin (Stroke Patients Only)       Balance Overall balance assessment: Needs assistance Sitting-balance support: No upper extremity supported;Feet supported Sitting balance-Leahy Scale: Good     Standing balance support: Bilateral upper extremity supported;During functional activity Standing balance-Leahy Scale: Poor                              Cognition Arousal/Alertness: Awake/alert Behavior During Therapy: WFL for tasks assessed/performed Overall Cognitive Status: History of cognitive impairments - at baseline                                 General Comments: slow processing      Exercises      General Comments        Pertinent Vitals/Pain Pain Assessment: Faces Faces Pain Scale: Hurts little more(6 with stairs) Pain Location: left lower back Pain Descriptors / Indicators: Aching;Sharp Pain Intervention(s): Limited activity within patient's tolerance;Monitored during session;Repositioned    Home Living                      Prior Function            PT Goals (current goals can now be found in the care plan section) Acute  Rehab PT Goals Patient Stated Goal: to go home  PT Goal Formulation: With patient Time For Goal Achievement: 07/31/17 Potential to Achieve Goals: Good Progress towards PT goals: Progressing toward goals    Frequency    Min 3X/week      PT Plan Current plan remains appropriate    Co-evaluation              AM-PAC PT "6 Clicks" Daily Activity  Outcome Measure  Difficulty turning over in bed (including adjusting bedclothes, sheets and blankets)?: A Little Difficulty moving from lying on back to sitting on the side  of the bed? : A Little Difficulty sitting down on and standing up from a chair with arms (e.g., wheelchair, bedside commode, etc,.)?: A Little Help needed moving to and from a bed to chair (including a wheelchair)?: A Little Help needed walking in hospital room?: A Little Help needed climbing 3-5 steps with a railing? : A Little 6 Click Score: 18    End of Session Equipment Utilized During Treatment: Gait belt Activity Tolerance: Patient tolerated treatment well Patient left: with call bell/phone within reach;in bed;with bed alarm set Nurse Communication: Mobility status PT Visit Diagnosis: Unsteadiness on feet (R26.81);Other abnormalities of gait and mobility (R26.89);Muscle weakness (generalized) (M62.81)     Time: 3329-5188 PT Time Calculation (min) (ACUTE ONLY): 35 min  Charges:  $Gait Training: 8-22 mins $Therapeutic Activity: 8-22 mins                    G Codes:       Earney Navy, PTA Pager: [phone]     Darliss Cheney 07/20/2017, 10:03 AM

## 2017-07-21 LAB — BASIC METABOLIC PANEL
ANION GAP: 5 (ref 5–15)
BUN: 14 mg/dL (ref 6–20)
CALCIUM: 8.7 mg/dL — AB (ref 8.9–10.3)
CO2: 23 mmol/L (ref 22–32)
Chloride: 111 mmol/L (ref 98–111)
Creatinine, Ser: 1.58 mg/dL — ABNORMAL HIGH (ref 0.61–1.24)
GFR calc Af Amer: 56 mL/min — ABNORMAL LOW (ref >60)
GFR, EST NON AFRICAN AMERICAN: 48 mL/min — AB (ref >60)
Glucose, Bld: 163 mg/dL — ABNORMAL HIGH (ref 70–99)
Potassium: 3.7 mmol/L (ref 3.5–5.1)
Sodium: 139 mmol/L (ref 135–145)

## 2017-07-21 LAB — GLUCOSE, CAPILLARY
GLUCOSE-CAPILLARY: 142 mg/dL — AB (ref 70–99)
Glucose-Capillary: 193 mg/dL — ABNORMAL HIGH (ref 70–99)
Glucose-Capillary: 118 mg/dL — ABNORMAL HIGH (ref 70–99)

## 2017-07-21 MED ORDER — SENNOSIDES-DOCUSATE SODIUM 8.6-50 MG PO TABS: 1.0000 | ORAL_TABLET | Freq: Two times a day (BID) | ORAL | 0 refills | Status: DC

## 2017-07-21 MED ORDER — POLYETHYLENE GLYCOL 3350 17 G PO PACK: 17.0000 g | PACK | Freq: Every day | ORAL | 0 refills | Status: DC | PRN

## 2017-07-21 MED ORDER — SENNOSIDES-DOCUSATE SODIUM 8.6-50 MG PO TABS
1.0000 | ORAL_TABLET | Freq: Two times a day (BID) | ORAL | Status: DC
  Administered 2017-07-21: 1 via ORAL
  Filled 2017-07-21: qty 1

## 2017-07-21 MED ORDER — POLYETHYLENE GLYCOL 3350 17 G PO PACK
17.0000 g | PACK | Freq: Every day | ORAL | Status: DC
  Administered 2017-07-21: 17 g via ORAL
  Filled 2017-07-21: qty 1

## 2017-07-21 NOTE — Progress Notes (Signed)
Patient will DC to: [place] Anticipated DC date: 07/21/17 Family notified: Patient alerting family Transport by: Corey Harold (they are behind)   Per MD patient ready for DC to [facility]. RN, patient, patient's family, and facility notified of DC. Discharge Summary sent to facility. RN given number for report [phone]). DC packet on chart. Ambulance transport requested for patient.   CSW signing off.  Cedric Fishman, LCSW Clinical Social Worker [phone]

## 2017-07-21 NOTE — Progress Notes (Signed)
CSW received phone call from pt's RN. Sister decided to take pt home with her. Pt is agreeable to this plan. ED RN CM and CSW reviewed that pt will not receive home health services due to his insurance. Pt referred to Outpatient Physical Therapy on [address]. Pt's sister understands the required care for pt. Sister informed staff that pt is established with [facility] and is able to use [facility] to get to his appointments. Pt receives PCS through DSS for 2 hours a day, pt's sister is working on increasing the number of hours he is receiving.   Plan: Pt will discharge home with sister and receive PT as an outpatient. Family and pt agreeable to this plan.   Wendelyn Breslow, [facility]  [phone]

## 2017-07-21 NOTE — Progress Notes (Signed)
Pt's sister called and asked me to have the ambulance transport and trip to [place] cancelled as she would take pt home with her and care for him.  I called care managers to clarify situation. MD made aware, asked to order DME standard front wheel walker. Care managers to set up outpatient PT. Per them, sister understands what Medicaid will and will not pay for.

## 2017-07-21 NOTE — Progress Notes (Signed)
CSWs and RNCM spoke with patient to confirm discharge plan due to patient stating that his cousins were coming to get him. CSW allowed patient time to contact family and he was unable to do so. He is in agreement that he will go to [place] today.   Eric [name] LCSW [phone]

## 2017-07-21 NOTE — Progress Notes (Signed)
Pasrrr received: [id number] A. [facility] reviewing.  Percell Locus Leanord Thibeau LCSW [phone]

## 2017-07-21 NOTE — Progress Notes (Signed)
Pt discharged to home. AVS reprinted and given to patient and sister - discharge instructions including medications and follow up were discussed with both. I answered their questions and they verbalized understanding and taught back about his medications. An order for DME walker was placed. I removed the patient's IV. He is in stable condition for discharge.

## 2017-07-21 NOTE — Clinical Social Work Placement (Signed)
   CLINICAL SOCIAL WORK PLACEMENT  NOTE  Date:  07/21/2017  Patient Details  Name: Eric Morton MRN: [id number] Date of Birth: [birth year]  Clinical Social Work is seeking post-discharge placement for this patient at the [place] level of care (*CSW will initial, date and re-position this form in  chart as items are completed):  Yes   Patient/family provided with [facility] Work Department's list of facilities offering this level of care within the geographic area requested by the patient (or if unable, by the patient's family).  Yes   Patient/family informed of their freedom to choose among providers that offer the needed level of care, that participate in Medicare, Medicaid or managed care program needed by the patient, have an available bed and are willing to accept the patient.  Yes   Patient/family informed of [facility]'s ownership interest in [facility] and [facility], as well as of the fact that they are under no obligation to receive care at these facilities.  PASRR submitted to EDS on       PASRR number received on       Existing PASRR number confirmed on 07/21/17     FL2 transmitted to all facilities in geographic area requested by pt/family on 07/21/17     FL2 transmitted to all facilities within larger geographic area on       Patient informed that his/her managed care company has contracts with or will negotiate with certain facilities, including the following:        Yes   Patient/family informed of bed offers received.  Patient chooses bed at [facility]     Physician recommends and patient chooses bed at      Patient to be transferred to [place] on 07/21/17.  Patient to be transferred to facility by [facility]     Patient family notified on 07/21/17 of transfer.  Name of family member notified:  Patient alerting sister     PHYSICIAN       Additional Comment:     _______________________________________________ Benard Halsted, [place] 07/21/2017, 2:30 PM

## 2017-07-22 ENCOUNTER — Telehealth: Payer: Self-pay

## 2017-07-22 NOTE — Telephone Encounter (Signed)
Patient's status discussed with Geryl Rankins, NP.  She approved referral to [facility]. Call placed to Marcelino Scot, [place] and provided her with an update on the patient's status.  Referral faxed to [facility] for care coordination, disease management and nutrition education.

## 2017-07-22 NOTE — Telephone Encounter (Signed)
Call placed to patient's sister, [name], to inquire about patient's status at home. She stated that she just couldn't bring herself to placing him in the facility and decided to take him home.   She explained that they have discussed new ground rules  - he will be on a strict diet as he was in the hospital, he will need to out to rehab for physical therapy and he has agreed to "do better."   She said that she is giving him another chance to make changes and if it doesn't work out, she will then request that he be placed in a facility.  She explained that PCS has decreased his hours. They are waiting for [facility] to call for an assessment.   She is agreeable to a referral to [facility] if the provider is in agreement. [facility] can provide case management in the home as well as nutrition education and disease management.   She is also requesting a wheeled walker as was ordered in the hospital - rolling walker with 5 " wheels.  He did not receive it at discharge.   She said that his medications were sent to [facility] but that is not her pharmacy preference.  She said she will call to have them transferred to [facility]. This CM informed her that [facility] has home delivery and if she is interested, she can call [facility] to have the prescriptions transferred.   No other questions/concerns reported at this time

## 2017-07-23 ENCOUNTER — Other Ambulatory Visit: Payer: Self-pay | Admitting: Nurse Practitioner

## 2017-07-23 MED ORDER — MISC. DEVICES MISC: 0 refills | Status: DC

## 2017-07-23 NOTE — Telephone Encounter (Signed)
Noted. Script for walker has been placed in medication orders under miscellaneous. It can be printed out and faxed or patient can pick up. Thank you.

## 2017-07-24 ENCOUNTER — Telehealth: Payer: Self-pay | Admitting: Nurse Practitioner

## 2017-07-24 NOTE — Telephone Encounter (Signed)
Kara Pacer from [employer] called the office asking to speak with Opal Sidles, case manager, to inform her that patient's referral was received.

## 2017-07-24 NOTE — Telephone Encounter (Signed)
Call placed to patient's sister, [name], regarding transportation. I wanted to inform [name] that patient is eligible for Medicaid transportation. Also, wanted to ask her if [facility] has contacted them for assessment. No answer. Left message asking her for a call back at [phone].

## 2017-07-27 ENCOUNTER — Telehealth: Payer: Self-pay

## 2017-07-27 ENCOUNTER — Other Ambulatory Visit: Payer: Self-pay | Admitting: *Deleted

## 2017-07-27 ENCOUNTER — Other Ambulatory Visit: Payer: Self-pay | Admitting: Pharmacist

## 2017-07-27 MED ORDER — MISC. DEVICES MISC: 0 refills | Status: DC

## 2017-07-27 NOTE — Telephone Encounter (Signed)
Prescription for RW faxed to [facility] 

## 2017-07-27 NOTE — Telephone Encounter (Signed)
Call received from the patient's sister, Eric Morton. She stated that she was with the patient at an eye doctor appointment. She said that he is doing " pretty good" and is trying to do better with his eating.  She reported that she received accu-chek test strips from the pharmacy and her brother has a true metrix meter and has been receiving true metrix test strips. . Informed her that he now has medicaid and he is  eligible for the accu-chek meter. Will notify the [facility] pharmacist.    She also stated that she received a letter from [place] and needs to provide some more information for the application.  She stated that she will call this CM tomorrow if she has any questions when she is home and can look at the application. Reminded her that he is also eligible for medicaid transportation to his medical appointments. She said that she was aware and  that her brother  received a notice about medicaid transportation when he received his disability award letter.   This CM informed [name], [facility] Tech and Acquanetta Belling [place], [place] about the need for the Accu-chek meter. Joycelyn Schmid  stated that she would contact the patient's pharmacy.  She then noted that she contacted [facility] and reported that the patient was not charged for the test strips  strips and she gave [facility]  a verbal order  for the lancets and Accuchek meter.   Call placed to the patient's sister and informed her that the accucheck meter was ordered and the order for the RW was sent to [facility].

## 2017-07-28 ENCOUNTER — Telehealth: Payer: Self-pay | Admitting: Nurse Practitioner

## 2017-07-28 NOTE — Telephone Encounter (Signed)
Noted  

## 2017-07-28 NOTE — Telephone Encounter (Signed)
Call placed to [facility] regarding rolling walker rx. Spoke with Aldona Bar and she informed me that patient received a walker back in November 3,2018 from them and Medicaid (insurance) paid for it. Unless patient pays out of pocket for this walker, insurance will not cover it.

## 2017-07-28 NOTE — Telephone Encounter (Signed)
Call placed to patient's sister [name] in regards to my conversation with [facility]. Informed [name] that insurance will not cover new rolling walker request since patient recently received one on Nov. 3,2018. [name] then stated that he did receive a walker but "its for patient with stroke" and therefore needs a new one. Advised [name] to contact [facility] directly to inform them of this and see what she can do. No further questions or concerns.

## 2017-07-28 NOTE — Telephone Encounter (Signed)
Call received from Earlie Server [facility] regarding patient and PCS services. Dorothy informed that agency that was providing services to patient decreased number of hours after the assessment was completed. Earlie Server stated that she is scheduled to visit patient and sister this afternoon and will make an assessment of patient's condition. Earlie Server is aware that patient has an upcoming appointment on 08/04/17 and therefore hopes provider will be able to assess patient and determine if hours of PCS services need to be increased or not.   Information of conversation was given to Lowell Guitar., case Freight forwarder.

## 2017-07-29 ENCOUNTER — Telehealth: Payer: Self-pay

## 2017-07-29 NOTE — Telephone Encounter (Signed)
Call received from Marcelino Scot, [place] to report the outcome of her home visit yesterday. She thoroughly  reviewed medication and nutrition with the patient and his sister.  His sister however was only present for part of the visit as she had to leave.  Earlie Server  will plan to have the nutritionist see the patient at home.  She encouraged him comply with recommendations from his provider and also reviewed diabetic management with the patient and his sister. She instructed his sister to check his blood sugar three times daily before meals and at bedtime. They are to keep a log of all results and bring the log with them to the PCP appointment next week - 08/04/17.  Earlie Server also noted that the patient's sister had mentioned that she has given the patient lantus 20 units during the day if needed when she thought his blood sugar might be high. Dorothy stressed again the importance of compliance with the providers orders and to contact the PCP if his blood sugar is high not administer extra lantus.

## 2017-08-02 ENCOUNTER — Other Ambulatory Visit: Payer: Self-pay | Admitting: Nurse Practitioner

## 2017-08-02 MED ORDER — TRAZODONE HCL 50 MG PO TABS: ORAL_TABLET | ORAL | 2 refills | Status: DC

## 2017-08-02 NOTE — Telephone Encounter (Signed)
Noted. I spoke with patient's sister over the weekend. She would like to discuss placement options at this time. States she is overwhelmed and feels his overall health issues would be better treated in a more controlled environment.

## 2017-08-03 ENCOUNTER — Telehealth: Payer: Self-pay

## 2017-08-03 NOTE — Telephone Encounter (Signed)
Call placed to the patient's sister, [name], at the request of Geryl Rankins, NP as she ( sister) would liek to place the patient in a facility.   Call placed to [facility] who explained that she is no longer able to provide the care for the patient that he needs. She said that he has not been compliant with his medications and diet.  She reports that all he does is eat, sleep and sit on the neighbor's porch. She said that she would like him to go to an ALF as he is not working with her and has not " changed" his behaviors.  She stated that he receives $771/month in disability and that has been used to pay  the rent.  Explained to her that his income would go to a facility. She said that she understood and plans to get a job for herself. She noted that has recently had to turn down job offers to care for him. When asked if the patient is agreeable to placement she said no.  Explained to her that he needs to be in agreement with the plan.  She said that she may need to go to DSS to explain her situation - she is not able to care for him any longer but does not want to just put him out of the house without the needed support to remain safe. Informed her that this CM would discuss with his PCP and call her back.    Update provided to Geryl Rankins, NP .  This CM to call [name] and schedule an appointment for the patient to come to the clinic to discuss placement.   Call placed to [name], patient's sister and a HIPAA complaint voicemail message was left requesting call back to this CM # [phone].

## 2017-08-04 ENCOUNTER — Inpatient Hospital Stay: Payer: No Typology Code available for payment source | Admitting: Nurse Practitioner

## 2017-08-04 ENCOUNTER — Telehealth: Payer: Self-pay

## 2017-08-04 NOTE — Telephone Encounter (Signed)
Attempted to contact the patient's sister to discuss scheduling an appointment to meet with the patient to discuss possible facility placement.  Call placed to # [phone] and a HIPAA compliant voicemail message was left requesting a call back to # [phone].

## 2017-08-05 ENCOUNTER — Telehealth: Payer: Self-pay

## 2017-08-05 NOTE — Telephone Encounter (Signed)
Attempted to contact the patient's sister, [name],  to discuss scheduling an appointment to meet with the patient to discuss possible facility placement.  Call placed to # [phone] and a HIPAA compliant voicemail message was left requesting a call back to # [phone].

## 2017-08-20 ENCOUNTER — Other Ambulatory Visit: Payer: Self-pay | Admitting: Nurse Practitioner

## 2017-08-20 DIAGNOSIS — E1142 Type 2 diabetes mellitus with diabetic polyneuropathy: Secondary | ICD-10-CM

## 2017-08-20 MED ORDER — INSULIN GLARGINE 100 UNIT/ML SOLOSTAR PEN: 10.0000 [IU] | PEN_INJECTOR | Freq: Every day | SUBCUTANEOUS | 11 refills | Status: DC

## 2017-08-20 MED ORDER — GLIPIZIDE 5 MG PO TABS: 5.0000 mg | ORAL_TABLET | Freq: Every day | ORAL | 0 refills | Status: DC

## 2017-08-21 ENCOUNTER — Other Ambulatory Visit: Payer: Self-pay | Admitting: Nurse Practitioner

## 2017-08-24 ENCOUNTER — Telehealth: Payer: Self-pay

## 2017-08-24 NOTE — Telephone Encounter (Signed)
Thank you so much for contacting her. I had spoken to her as well and she had the same concerns that she had voiced to you. Will continue to follow along and offer any assistance as needed

## 2017-08-24 NOTE — Telephone Encounter (Signed)
Call placed to the patient's sister, [name] to discuss possible SNF placement for the patient. She explained that there has been no change in his status, noting that all he wants to do is sleep, eat and watch tv.  He does not even want to walk. She said that he will not eat what she cooks and when she leaves the home, he will call family members to bring him the food that he wants.   She said that [employer] hours have not been increased but that alone would not allow her to go to work for any extended period of time. She also said that he refuses to go to [facility] program during the day..  She is still interested in SNF placement. Informed her that the patient needs to be included with the discussion about placement. She said that she would speak to him. Informed her that she can contact this CM after speaking to him and an appointment can be scheduled with him to further discuss in the office with this CM and SW. She was appreciative of the call and said she would discuss with him.

## 2017-09-11 ENCOUNTER — Telehealth: Payer: Self-pay

## 2017-09-11 NOTE — Telephone Encounter (Signed)
Call received from patient's sister, [name].  She explained that the patient has become more difficult for her to care for  - he is not eating well, has become incontinent/won't go to the bathroom, and has experienced some falls due to weakness of his legs. She said that she called [facility] and they have beds available for him as she is looking for facility placement for him.  She said that all she needs is an FL2 completed.  She said that she has mentioned it to him but he asks...how long does he have to stay there?.  This CM explained that the patient can't be placed against his will. She was agreeable to coming to the clinic with him to discuss placement and an appointment was scheduled for 09/22/17 @ 1000.

## 2017-09-15 NOTE — Telephone Encounter (Signed)
NOTED. Thank you

## 2017-09-20 ENCOUNTER — Other Ambulatory Visit: Payer: Self-pay | Admitting: Nurse Practitioner

## 2017-09-20 DIAGNOSIS — K219 Gastro-esophageal reflux disease without esophagitis: Secondary | ICD-10-CM

## 2017-09-20 DIAGNOSIS — E1169 Type 2 diabetes mellitus with other specified complication: Secondary | ICD-10-CM

## 2017-09-20 DIAGNOSIS — E785 Hyperlipidemia, unspecified: Secondary | ICD-10-CM

## 2017-09-21 ENCOUNTER — Telehealth: Payer: Self-pay

## 2017-09-21 ENCOUNTER — Institutional Professional Consult (permissible substitution): Payer: Medicaid Other | Admitting: Licensed Clinical Social Worker

## 2017-09-21 NOTE — Telephone Encounter (Signed)
Call received from patient's sister, [name], confirming the appointment scheduled for tomorrow to discuss SNF placement.

## 2017-09-22 ENCOUNTER — Telehealth: Payer: Self-pay

## 2017-09-22 ENCOUNTER — Institutional Professional Consult (permissible substitution): Payer: Medicaid Other | Admitting: Licensed Clinical Social Worker

## 2017-09-22 NOTE — Telephone Encounter (Signed)
Call received from [place] with [employer] who confirmed that they received Part B of the application. They still need a few questions answered on Part A and sent a letter to the family notifying them of the need for completing the questions.

## 2017-09-22 NOTE — Telephone Encounter (Signed)
Call received from patient's sister, [name]. She stated that she needed to reschedule today's appointment. She explained that her brother had an appointment with the eye doctor yesterday and and he had to get " needles in his eyes" and she was giving him time to rest today. The appointment to discuss SNF was rescheduled to 10/12/17 @ 1130.

## 2017-09-22 NOTE — Telephone Encounter (Signed)
Attempted to contact patient's sister, [name] to discuss re-scheduling the appointment to discus SNF placement as she cancelled the appointment for today. Call placed to # [phone] and a message was left requesting a call back to # [phone].

## 2017-09-24 ENCOUNTER — Other Ambulatory Visit: Payer: Self-pay | Admitting: Internal Medicine

## 2017-09-24 DIAGNOSIS — E1169 Type 2 diabetes mellitus with other specified complication: Secondary | ICD-10-CM

## 2017-09-24 DIAGNOSIS — E785 Hyperlipidemia, unspecified: Principal | ICD-10-CM

## 2017-10-12 ENCOUNTER — Institutional Professional Consult (permissible substitution): Payer: Medicaid Other | Admitting: Licensed Clinical Social Worker

## 2017-10-13 ENCOUNTER — Institutional Professional Consult (permissible substitution): Payer: Medicaid Other | Admitting: Licensed Clinical Social Worker

## 2017-10-14 ENCOUNTER — Telehealth: Payer: Self-pay

## 2017-10-14 NOTE — Telephone Encounter (Signed)
Attempted to contact the patient's sister as she and the patient missed the appointment 10/12/17 to discuss SNF placement. Message left requesting a call back to # [phone].

## 2017-11-03 ENCOUNTER — Telehealth: Payer: Self-pay

## 2017-11-03 NOTE — Telephone Encounter (Signed)
Call received from patient's sister, [name] requesting to meet with this CM about SNF placement for her brother. An appointment was scheduled for 11/06/17 @ 1000.

## 2017-11-04 ENCOUNTER — Telehealth: Payer: Self-pay

## 2017-11-04 NOTE — Telephone Encounter (Signed)
Call placed to [name], patient's sister and reminded her that the appointment on 11/06/17 is at 1000 even if she gets a call stating it is at 1600.  She said that she understood because she already received a call about the 1600 appointment. She said she would be at the clinic at 1000 .

## 2017-11-06 ENCOUNTER — Institutional Professional Consult (permissible substitution): Payer: Medicaid Other | Admitting: Licensed Clinical Social Worker

## 2017-11-17 ENCOUNTER — Other Ambulatory Visit: Payer: Self-pay

## 2017-11-18 ENCOUNTER — Ambulatory Visit (INDEPENDENT_AMBULATORY_CARE_PROVIDER_SITE_OTHER): Payer: Medicaid Other | Admitting: Family Medicine

## 2017-11-18 ENCOUNTER — Encounter: Payer: Self-pay | Admitting: Family Medicine

## 2017-11-18 ENCOUNTER — Telehealth: Payer: Self-pay | Admitting: Family Medicine

## 2017-11-18 VITALS — BP 94/65 | HR 73 | Temp 97.9°F | Resp 17 | Ht 68.0 in | Wt 182.0 lb

## 2017-11-18 DIAGNOSIS — E11621 Type 2 diabetes mellitus with foot ulcer: Secondary | ICD-10-CM | POA: Diagnosis not present

## 2017-11-18 DIAGNOSIS — Z794 Long term (current) use of insulin: Secondary | ICD-10-CM

## 2017-11-18 DIAGNOSIS — E1159 Type 2 diabetes mellitus with other circulatory complications: Secondary | ICD-10-CM | POA: Diagnosis not present

## 2017-11-18 DIAGNOSIS — I959 Hypotension, unspecified: Secondary | ICD-10-CM

## 2017-11-18 DIAGNOSIS — I10 Essential (primary) hypertension: Secondary | ICD-10-CM | POA: Diagnosis not present

## 2017-11-18 DIAGNOSIS — L97509 Non-pressure chronic ulcer of other part of unspecified foot with unspecified severity: Secondary | ICD-10-CM

## 2017-11-18 MED ORDER — CEPHALEXIN 500 MG PO CAPS: 500.0000 mg | ORAL_CAPSULE | Freq: Three times a day (TID) | ORAL | 0 refills | Status: AC

## 2017-11-18 MED ORDER — TRAZODONE HCL 50 MG PO TABS: 100.0000 mg | ORAL_TABLET | Freq: Every day | ORAL | 1 refills | Status: DC

## 2017-11-18 NOTE — Patient Instructions (Addendum)
Your oncologist Wyatt Portela, MD, there contact number [phone] ask for the cancer center.  You have been referred to [facility], [phone]. You will be contacted regarding scheduling your appointment.  Hold blood pressure medication x 11/20/2017, return for blood pressure recheck on 11/20/2017

## 2017-11-18 NOTE — Progress Notes (Deleted)
Eric Morton, is a 52 y.o. male  DVV:[id number]  GYI:[id number]  DOB - 1965-09-10  CC:  Chief Complaint  Patient presents with  . Blisters    c/o blisters on the toes of both feet. first noticed them yday. states that 1 of the blisters on his right foot has burst.       HPI: Eric Morton is a 52 y.o. male is here today to establish care.   Eric Morton has Essential hypertension; Type II diabetes mellitus ([place]); Cocaine abuse ([place]); Tobacco use; Hx of adenomatous colonic polyps; Polycythemia vera ([employer]); History of CVA (cerebrovascular accident); Hyperlipidemia associated with type 2 diabetes mellitus ([place]); Left-sided weakness; Hemiparesis affecting right side as late effect of stroke ([place]); and Renal insufficiency on their problem list.    Today's visit:    Patient denies new headaches, chest pain, abdominal pain, nausea, new weakness , numbness or tingling, SOB, edema, or worrisome cough. .    Current medications: Current Outpatient Medications:  .  amLODipine (NORVASC) 5 MG tablet, Take 1 tablet (5 mg total) by mouth daily., Disp: 90 tablet, Rfl: 2 .  aspirin (ASPIRIN CHILDRENS) 81 MG chewable tablet, Chew 1 tablet (81 mg total) by mouth daily., Disp: 36 tablet, Rfl: 11 .  Blood Glucose Monitoring Suppl (TRUE METRIX GO GLUCOSE METER) w/Device KIT, USE AS DIRECTED, Disp: , Rfl: 0 .  clopidogrel (PLAVIX) 75 MG tablet, Take 1 tablet (75 mg total) by mouth daily., Disp: 90 tablet, Rfl: 2 .  folic acid (FOLVITE) 1 MG tablet, TAKE 1 TABLET BY MOUTH DAILY, Disp: 30 tablet, Rfl: 0 .  glipiZIDE (GLUCOTROL) 10 MG tablet, TAKE 1 TABLET(10 MG) BY MOUTH DAILY BEFORE BREAKFAST, Disp: 90 tablet, Rfl: 0 .  glucose blood (TRUE METRIX BLOOD GLUCOSE TEST) test strip, 1 each by Other route 3 (three) times daily., Disp: 100 each, Rfl: 12 .  hydrocortisone cream 1 %, Apply topically 2 (two) times daily., Disp: 30 g, Rfl: 0 .  lisinopril-hydrochlorothiazide (PRINZIDE,ZESTORETIC) 20-25 MG tablet, Take 1  tablet by mouth daily., Disp: , Rfl: 3 .  Multiple Vitamin (MULTIVITAMIN WITH MINERALS) TABS tablet, Take 1 tablet by mouth daily., Disp: , Rfl:  .  pantoprazole (PROTONIX) 40 MG tablet, TAKE 1 TABLET(40 MG) BY MOUTH DAILY AT 6 AM, Disp: 90 tablet, Rfl: 0 .  rosuvastatin (CRESTOR) 40 MG tablet, TAKE 1 TABLET BY MOUTH DAILY AT 6 PM, Disp: 30 tablet, Rfl: 2 .  sertraline (ZOLOFT) 25 MG tablet, TAKE 1 TABLET(25 MG) BY MOUTH DAILY, Disp: 90 tablet, Rfl: 0 .  traZODone (DESYREL) 50 MG tablet, Take 1-2 tablets (50-166m ) by mouth at bedtime, Disp: 60 tablet, Rfl: 2 .  TRUEPLUS LANCETS 28G MISC, 1 each by Does not apply route 3 (three) times daily., Disp: 100 each, Rfl: 11 .  gabapentin (NEURONTIN) 300 MG capsule, Take 1 capsule (300 mg total) by mouth 3 (three) times daily., Disp: 90 capsule, Rfl: 3   Pertinent family medical history: family history includes Diabetes in his mother; Hypertension in his mother.   Allergies  Allergen Reactions  . Lipitor [Atorvastatin] Other (See Comments)    Sister states had to have Cardiac Surgery    Social History   Socioeconomic History  . Marital status: Widowed    Spouse name: Not on file  . Number of children: Not on file  . Years of education: Not on file  . Highest education level: Not on file  Occupational History  . Not on file  Social Needs  .  Financial resource strain: Not on file  . Food insecurity:    Worry: Not on file    Inability: Not on file  . Transportation needs:    Medical: Not on file    Non-medical: Not on file  Tobacco Use  . Smoking status: Former Smoker    Packs/day: 0.25    Years: 32.00    Pack years: 8.00    Types: Cigarettes  . Smokeless tobacco: Never Used  Substance and Sexual Activity  . Alcohol use: Yes    Comment: 3/14 quit drinking 5 days ago and then drank last nite  . Drug use: Yes    Types: Marijuana, "Crack" cocaine    Comment: snorts and smokes cocaine ocassionally   . Sexual activity: Yes  Lifestyle   . Physical activity:    Days per week: Not on file    Minutes per session: Not on file  . Stress: Not on file  Relationships  . Social connections:    Talks on phone: Not on file    Gets together: Not on file    Attends religious service: Not on file    Active member of club or organization: Not on file    Attends meetings of clubs or organizations: Not on file    Relationship status: Not on file  . Intimate partner violence:    Fear of current or ex partner: Not on file    Emotionally abused: Not on file    Physically abused: Not on file    Forced sexual activity: Not on file  Other Topics Concern  . Not on file  Social History Narrative  . Not on file    Review of Systems: Constitutional: Negative for fever, chills, diaphoresis, activity change, appetite change and fatigue. HENT: Negative for ear pain, nosebleeds, congestion, facial swelling, rhinorrhea, neck pain, neck stiffness and ear discharge.  Eyes: Negative for pain, discharge, redness, itching and visual disturbance. Respiratory: Negative for cough, choking, chest tightness, shortness of breath, wheezing and stridor.  Cardiovascular: Negative for chest pain, palpitations and leg swelling. Gastrointestinal: Negative for abdominal distention. Genitourinary: Negative for dysuria, urgency, frequency, hematuria, flank pain, decreased urine volume, difficulty urinating. Musculoskeletal: Negative for back pain, joint swelling, arthralgia and gait problem. Neurological: Negative for dizziness, tremors, seizures, syncope, facial asymmetry, speech difficulty, weakness, light-headedness, numbness and headaches.  Hematological: Negative for adenopathy. Does not bruise/bleed easily. Psychiatric/Behavioral: Negative for hallucinations, behavioral problems, confusion, dysphoric mood, decreased concentration and agitation.    Objective:   Vitals:   11/18/17 0908  BP: 99/61  Pulse: 73  Resp: 17  Temp: 97.9 F (36.6 C)  SpO2:  99%    BP Readings from Last 3 Encounters:  11/18/17 99/61  07/21/17 130/78  07/14/17 97/60    Filed Weights   11/18/17 0908  Weight: 182 lb (82.6 kg)      Physical Exam: Constitutional: Patient appears well-developed and well-nourished. No distress. HENT: Normocephalic, atraumatic, External right and left ear normal. Oropharynx is clear and moist.  Eyes: Conjunctivae and EOM are normal. PERRLA, no scleral icterus. Neck: Normal ROM. Neck supple. No JVD. No tracheal deviation. No thyromegaly. CVS: RRR, S1/S2 +, no murmurs, no gallops, no carotid bruit.  Pulmonary: Effort and breath sounds normal, no stridor, rhonchi, wheezes, rales.  Abdominal: Soft. BS +, no distension, tenderness, rebound or guarding.  Musculoskeletal: Normal range of motion. No edema and no tenderness.  Neuro: Alert. Normal muscle tone coordination. Normal gait. BUE and BLE strength 5/5. Bilateral hand grips symmetrical.  No cranial nerve deficit. Skin: Skin is warm and dry. No rash noted. Not diaphoretic. No erythema. No pallor. Psychiatric: Normal mood and affect. Behavior, judgment, thought content normal.  Lab Results (prior encounters)  Lab Results  Component Value Date   WBC 6.3 07/15/2017   HGB 10.7 (L) 07/15/2017   HCT 33.0 (L) 07/15/2017   MCV 82.3 07/15/2017   PLT 196 07/15/2017   Lab Results  Component Value Date   CREATININE 1.58 (H) 07/21/2017   BUN 14 07/21/2017   NA 139 07/21/2017   K 3.7 07/21/2017   CL 111 07/21/2017   CO2 23 07/21/2017    Lab Results  Component Value Date   HGBA1C 13.2 (A) 07/14/2017       Component Value Date/Time   CHOL 146 07/14/2017 1626   TRIG 243 (H) 07/14/2017 1626   HDL 35 (L) 07/14/2017 1626   CHOLHDL 4.2 07/14/2017 1626   CHOLHDL 3.0 10/23/2016 1013   VLDL 32 10/23/2016 1013   LDLCALC 62 07/14/2017 1626        Assessment and plan:  There are no diagnoses linked to this encounter.  No follow-ups on file.   The patient was given clear  instructions to go to ER or return to medical center if symptoms don't improve, worsen or new problems develop. The patient verbalized understanding. The patient was advised  to call and obtain lab results if they haven't heard anything from out office within 7-10 business days.  Molli Barrows, FNP [facility] at [facility] [address] [place] [address] [phone]: [phone]   This note has been created with Dragon speech recognition software and sEngineer, materials Any transcriptional errors are unintentional.

## 2017-11-18 NOTE — Telephone Encounter (Signed)
Patient scheduled with Durward Fortes on 10/24 @ 1:30

## 2017-11-18 NOTE — Telephone Encounter (Signed)
Please ensure that patient is scheduled for an appointment with Dr. [name] emergently for diabetic foot ulcers evaluation

## 2017-11-19 ENCOUNTER — Encounter (INDEPENDENT_AMBULATORY_CARE_PROVIDER_SITE_OTHER): Payer: Self-pay | Admitting: Orthopaedic Surgery

## 2017-11-19 ENCOUNTER — Ambulatory Visit (INDEPENDENT_AMBULATORY_CARE_PROVIDER_SITE_OTHER): Payer: MEDICAID | Admitting: Orthopaedic Surgery

## 2017-11-19 ENCOUNTER — Other Ambulatory Visit (INDEPENDENT_AMBULATORY_CARE_PROVIDER_SITE_OTHER): Payer: Self-pay | Admitting: Radiology

## 2017-11-19 ENCOUNTER — Ambulatory Visit (INDEPENDENT_AMBULATORY_CARE_PROVIDER_SITE_OTHER): Payer: Medicaid Other | Admitting: Orthopaedic Surgery

## 2017-11-19 VITALS — BP 103/59 | HR 85 | Ht 67.5 in | Wt 182.0 lb

## 2017-11-19 DIAGNOSIS — S90822A Blister (nonthermal), left foot, initial encounter: Secondary | ICD-10-CM

## 2017-11-19 DIAGNOSIS — E1142 Type 2 diabetes mellitus with diabetic polyneuropathy: Secondary | ICD-10-CM | POA: Diagnosis not present

## 2017-11-19 DIAGNOSIS — L97511 Non-pressure chronic ulcer of other part of right foot limited to breakdown of skin: Secondary | ICD-10-CM

## 2017-11-19 DIAGNOSIS — R238 Other skin changes: Secondary | ICD-10-CM | POA: Diagnosis not present

## 2017-11-19 DIAGNOSIS — S90821A Blister (nonthermal), right foot, initial encounter: Secondary | ICD-10-CM

## 2017-11-19 DIAGNOSIS — L97521 Non-pressure chronic ulcer of other part of left foot limited to breakdown of skin: Secondary | ICD-10-CM

## 2017-11-19 NOTE — Progress Notes (Signed)
Office Visit Note   Patient: Eric Morton           Date of Birth: September 03, 1965           MRN: [id number] Visit Date: 11/19/2017              Requested by: Scot Jun, [place], [place] [address] PCP: [name], NP   Assessment & Plan: Visit Diagnoses:  1. Type 2 diabetes mellitus with diabetic polyneuropathy, without long-term current use of insulin (HCC)   2. Blisters of multiple sites     Plan: Superficial blisters both feet related to diabetic peripheral vascular disease and neuropathy.  Has been on antibiotics per his family physician will cover the blisters with mupropicin, gauze and wooden shoes.  Vascular surgery consultation.  Office early next week.  Hopefully these will heal without further complication.  They appear superficial.  Total office time over 45 minutes 50% of the time in counseling discussed avoidance of pressure to his feet using the wooden shoes and keeping them clean  Follow-Up Instructions: Return in about 1 week (around 11/26/2017).   Orders:  No orders of the defined types were placed in this encounter.  No orders of the defined types were placed in this encounter.     Procedures: No procedures performed   Clinical Data: No additional findings.   Subjective: Chief Complaint  Patient presents with  . New Patient (Initial Visit)    bi lat foot ulcers on both feet for 3 days  Eric Morton is 52 years old and visits the office for evaluation of blisters/ulcers of both of his feet.  He does have type 2 diabetes with peripheral vascular disease and neuropathy.  Over the last several days he has noted some blister formation in the dorsum of the right second toe and in the dorsum of the left great toe.  He was seen by his family physician yesterday and started on antibiotics and referred to the office.  He has loss of sensibility to both of his feet.  He sustained a "stroke" in September 2018 with residual weakness of  both of his lower extremities.  Apparently spends more time in the wheelchair and not ambulating.  HPI  Review of Systems  Constitutional: Negative for fatigue and fever.  HENT: Negative for ear pain.   Eyes: Positive for pain.  Respiratory: Negative for cough and shortness of breath.   Cardiovascular: Positive for leg swelling.  Gastrointestinal: Negative for constipation and diarrhea.  Genitourinary: Negative for difficulty urinating.  Musculoskeletal: Positive for back pain. Negative for neck pain.  Skin: Negative for rash.  Allergic/Immunologic: Negative for food allergies.  Hematological: Does not bruise/bleed easily.  Psychiatric/Behavioral: Positive for sleep disturbance.     Objective: Vital Signs: BP (!) 103/59 (BP Location: Left Arm, Patient Position: Sitting, Cuff Size: Normal)   Pulse 85   Ht 5' 7.5" (1.715 m)   Wt 182 lb (82.6 kg)   BMI 28.08 kg/m   Physical Exam  Ortho Exam awake alert and oriented x3.  Speech is slurred based on his prior stroke.  Evaluated in a wheelchair.  Has weakness of both of his lower extremities.  There is some mild nonpitting edema both of his feet.  There is a superficial blister over the PIP joint of the right second toe with some claw toe changes.  The skin beneath the blister is clean and dry.  There is no ascending lymphangitis or swelling  of the toe.  Altered sensibility and minimal pulses related to his diabetes.  Left foot has a blister over the IP joint of the great toe and just proximal to the metatarsal phalangeal joint.  Both blisters are clean.  They have been decompressed.  No erythema.  No ascending lymphangitis.  Altered sensibility.  I did not feel any pulses.  Good capillary refill to the toes.  No swelling of the great toe.  Specialty Comments:  No specialty comments available.  Imaging: No results found.   PMFS History: Patient Active Problem List   Diagnosis Date Noted  . Renal insufficiency 07/16/2017  .  Hemiparesis affecting right side as late effect of stroke ([place]) 10/29/2016  . Left-sided weakness 10/23/2016  . Hyperlipidemia associated with type 2 diabetes mellitus ([place]) 07/27/2015  . History of CVA (cerebrovascular accident)   . Polycythemia vera ([place]) 03/21/2015  . Hx of adenomatous colonic polyps   . Essential hypertension 09/25/2011  . Type II diabetes mellitus ([place]) 09/25/2011  . Cocaine abuse ([place]) 09/25/2011  . Tobacco use 09/25/2011   Past Medical History:  Diagnosis Date  . 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 Anxiety   . Chronic kidney disease    "jsut a little damage"  . Glaucoma   . History of stomach ulcers    not confirmed, never underwent diagnostic endo or radiology, this manifested as epigastric distress.   [name] Hx of adenomatous colonic polyps   . Hypercholesterolemia   . Hypertension   . Peripheral neuropathy   . Polycythemia vera ([place]) 2006  . Renal insufficiency 07/16/2017  . Stroke [facility])    slurred speech  . Substance abuse ([place])   . Type II diabetes mellitus (HCC)     Family History  Problem Relation Age of Onset  . Diabetes Mother   . Hypertension Mother     Past Surgical History:  Procedure Laterality Date  . COLONOSCOPY WITH PROPOFOL N/A 04/28/2014   Procedure: COLONOSCOPY WITH PROPOFOL;  Surgeon: Gatha Mayer, MD;  Location: [facility];  Service: Endoscopy;  Laterality: N/A;  . IRRIGATION AND DEBRIDEMENT ABSCESS  09/26/2011   Procedure: IRRIGATION AND DEBRIDEMENT ABSCESS;  Surgeon: Imogene Burn. Georgette Dover, MD;  Location: [facility];  Service: General;  Laterality: Left;  Incision and Drainage left breast abscess   Social History   Occupational History  . Not on file  Tobacco Use  . Smoking status: Former Smoker    Packs/day: 0.25    Years: 32.00    Pack years: 8.00    Types: Cigarettes  . Smokeless tobacco: Never Used  Substance and Sexual Activity  . Alcohol use: Yes    Comment: 3/14 quit drinking 5 days ago and then drank  last nite  . Drug use: Yes    Types: Marijuana, "Crack" cocaine    Comment: snorts and smokes cocaine ocassionally   . Sexual activity: Yes

## 2017-11-19 NOTE — Progress Notes (Signed)
Patient ID: Eric Morton, male    DOB: 07/30/1965, 52 y.o.   MRN: [id number]  PCP: [name], NP  Chief Complaint  Patient presents with  . Blisters    c/o blisters on the toes of both feet. first noticed them yday. states that 1 of the blisters on his right foot has burst.    Subjective:  HPI  Eric Morton is a 52 y.o. male presents for evaluation of bilateral blisters both feet.   Eric Morton has Essential hypertension; Type II diabetes mellitus ([place]); Cocaine abuse ([place]); Tobacco use; Hx of adenomatous colonic polyps; Polycythemia vera ([place]); History of CVA (cerebrovascular accident); Hyperlipidemia associated with type 2 diabetes mellitus ([place]); Left-sided weakness; Hemiparesis affecting right side as late effect of stroke ([place]); and Renal insufficiency on their problem list.  Eric Morton is accompanied by his caregiver today. Caregiver reports noticing a few days ago blisters bilaterally on both of patient's feet. He suffers from diabetes and history of vascular disease. He reports decreased sensation in bilateral feet therefore the patient reports he did not know the blisters were present nor how long present on feet. He doesn't ambulate very much. Mostly confined to wheelchair. Diabetes severely uncontrolled. Last A1C 13.2.  Social History   Socioeconomic History  . Marital status: Widowed    Spouse name: Not on file  . Number of children: Not on file  . Years of education: Not on file  . Highest education level: Not on file  Occupational History  . Not on file  Social Needs  . Financial resource strain: Not on file  . Food insecurity:    Worry: Not on file    Inability: Not on file  . Transportation needs:    Medical: Not on file    Non-medical: Not on file  Tobacco Use  . Smoking status: Former Smoker    Packs/day: 0.25    Years: 32.00    Pack years: 8.00    Types: Cigarettes  . Smokeless tobacco: Never Used  Substance and Sexual Activity  . Alcohol  use: Yes    Comment: 3/14 quit drinking 5 days ago and then drank last nite  . Drug use: Yes    Types: Marijuana, "Crack" cocaine    Comment: snorts and smokes cocaine ocassionally   . Sexual activity: Yes  Lifestyle  . Physical activity:    Days per week: Not on file    Minutes per session: Not on file  . Stress: Not on file  Relationships  . Social connections:    Talks on phone: Not on file    Gets together: Not on file    Attends religious service: Not on file    Active member of club or organization: Not on file    Attends meetings of clubs or organizations: Not on file    Relationship status: Not on file  . Intimate partner violence:    Fear of current or ex partner: Not on file    Emotionally abused: Not on file    Physically abused: Not on file    Forced sexual activity: Not on file  Other Topics Concern  . Not on file  Social History Narrative  . Not on file    Family History  Problem Relation Age of Onset  . Diabetes Mother   . Hypertension Mother      Review of Systems  Patient Active Problem List   Diagnosis Date Noted  . Renal insufficiency 07/16/2017  .  Hemiparesis affecting right side as late effect of stroke ([place]) 10/29/2016  . Left-sided weakness 10/23/2016  . Hyperlipidemia associated with type 2 diabetes mellitus ([place]) 07/27/2015  . History of CVA (cerebrovascular accident)   . Polycythemia vera ([place]) 03/21/2015  . Hx of adenomatous colonic polyps   . Essential hypertension 09/25/2011  . Type II diabetes mellitus ([place]) 09/25/2011  . Cocaine abuse ([place]) 09/25/2011  . Tobacco use 09/25/2011    Allergies  Allergen Reactions  . Lipitor [Atorvastatin] Other (See Comments)    Sister states had to have Cardiac Surgery    Prior to Admission medications   Medication Sig Start Date End Date Taking? Authorizing Provider  amLODipine (NORVASC) 5 MG tablet Take 1 tablet (5 mg total) by mouth daily. 07/14/17  Yes [name], NP  aspirin (ASPIRIN  CHILDRENS) 81 MG chewable tablet Chew 1 tablet (81 mg total) by mouth daily. 11/28/16 11/28/17 Yes Angiulli, Lavon Paganini, PA-C  Blood Glucose Monitoring Suppl (TRUE METRIX GO GLUCOSE METER) w/Device KIT USE AS DIRECTED 12/12/16  Yes [provider]  clopidogrel (PLAVIX) 75 MG tablet Take 1 tablet (75 mg total) by mouth daily. 07/14/17  Yes [name], NP  folic acid (FOLVITE) 1 MG tablet TAKE 1 TABLET BY MOUTH DAILY 08/21/17  Yes Newlin, Enobong, MD  glipiZIDE (GLUCOTROL) 10 MG tablet TAKE 1 TABLET(10 MG) BY MOUTH DAILY BEFORE BREAKFAST 09/21/17  Yes [name], NP  glucose blood (TRUE METRIX BLOOD GLUCOSE TEST) test strip 1 each by Other route 3 (three) times daily. 07/14/17  Yes [name], NP  hydrocortisone cream 1 % Apply topically 2 (two) times daily. 07/19/17  Yes Hosie Poisson, MD  lisinopril-hydrochlorothiazide (PRINZIDE,ZESTORETIC) 20-25 MG tablet Take 1 tablet by mouth daily. 10/07/17  Yes [provider]  Multiple Vitamin (MULTIVITAMIN WITH MINERALS) TABS tablet Take 1 tablet by mouth daily. 11/29/16  Yes Angiulli, Lavon Paganini, PA-C  pantoprazole (PROTONIX) 40 MG tablet TAKE 1 TABLET(40 MG) BY MOUTH DAILY AT 6 AM 09/21/17  Yes [name], NP  rosuvastatin (CRESTOR) 40 MG tablet TAKE 1 TABLET BY MOUTH DAILY AT 6 PM 09/24/17  Yes [name], NP  sertraline (ZOLOFT) 25 MG tablet TAKE 1 TABLET(25 MG) BY MOUTH DAILY 09/21/17  Yes [name], NP  TRUEPLUS LANCETS 28G MISC 1 each by Does not apply route 3 (three) times daily. 07/14/17  Yes [name], NP  cephALEXin (KEFLEX) 500 MG capsule Take 1 capsule (500 mg total) by mouth 3 (three) times daily for 10 days. 11/18/17 11/28/17  Scot Jun, FNP  gabapentin (NEURONTIN) 300 MG capsule Take 1 capsule (300 mg total) by mouth 3 (three) times daily. 07/14/17 08/13/17  [name], NP  traZODone (DESYREL) 50 MG tablet Take 2 tablets (100 mg total) by mouth at bedtime. 11/18/17   Scot Jun,  FNP   Past Medical, Surgical Family and Social History reviewed and updated.   Objective:   Today's Vitals   11/18/17 0908 11/18/17 1011  BP: 99/61 94/65  Pulse: 73   Resp: 17   Temp: 97.9 F (36.6 C)   TempSrc: Oral   SpO2: 99%   Weight: 182 lb (82.6 kg)   Height: _0  (1.727 m)     Wt Readings from Last 3 Encounters:  11/19/17 182 lb (82.6 kg)  11/18/17 182 lb (82.6 kg)  07/20/17 175 lb 14.8 oz (79.8 kg)   Physical Exam General appearance: alert, well developed, well nourished, cooperative  and in no distress Head: Normocephalic, without obvious abnormality, atraumatic Respiratory: Respirations even and unlabored, normal respiratory rate Heart: rate and rhythm normal. No gallop or murmurs noted on exam  Extremities: No gross deformities Skin: Skin color, texture, turgor normal. No rashes seen  Psych: Appropriate mood and affect. Neurologic: Mental status: Alert, oriented to person, place, and time, thought content appropriate.  Lab Results  Component Value Date   POCGLU 461 (A) 07/14/2017   POCGLU 469 (A) 07/14/2017   POCGLU 236 (A) 06/06/2016    Lab Results  Component Value Date   HGBA1C 13.2 (A) 07/14/2017       Assessment & Plan:  1. Diabetic foot ulcer associated with very poorly controlled type 2 diabetes mellitus, unspecified laterality, unspecified part of foot, unspecified ulcer stage ([place]) - Ambulatory referral to Orthopedic Surgery - CBC with Differential, to rule out infection. -Start prophylactic antibioticus to prevent infection as patient's uncontrolled diabetes and peripheral disease will likely delay wound healing.   2. Essential hypertension is hypotensive today.  Obtaining a CMP to rule out any underlying renal dysfunction. Having patient return on 11/01/18/2019 for blood pressure follow-up.  Asked to hold all blood pressure medication until patient is seen again in office. Comprehensive metabolic panel; Future  3. Type 2 diabetes mellitus with  other circulatory complication, with long-term current use of insulin ([place]), uncontrolled. -Last A1C 13.2.  -Recommend follow-up with PCP for medication management and repeat A1C.     4. Hypotension, unspecified hypotension type  Hypotensive today. Obtaining a CMP to rule out any underlying renal dysfunction. Having patient return on 11/01/18/2019 for blood pressure follow-up.  Asked to hold all blood pressure medication until patient is seen again in office. Comprehensive metabolic panel; Future  5. Insomnia , currently prescribed Trazodone 50 mg at bedtime. Increased to 100 mg daily at bedtime.  Patient and caregiver had several complaints that could not be addressed today, as today was an acute same day appointment. Advised to schedule a follow-up with PCP next available. However, given very low blood pressure, ask patient to follow-up here on 11/20/2017 to have BP rechecked.   Meds ordered this encounter  Medications  . traZODone (DESYREL) 50 MG tablet    Sig: Take 2 tablets (100 mg total) by mouth at bedtime.    Dispense:  30 tablet    Refill:  1  . cephALEXin (KEFLEX) 500 MG capsule    Sig: Take 1 capsule (500 mg total) by mouth 3 (three) times daily for 10 days.    Dispense:  30 capsule    Refill:  0    A total of 25 minutes spent, greater than 50 % of this time was spent counseling and coordination of care.  -The patient was given clear instructions to go to ER or return to medical center if symptoms do not improve, worsen or new problems develop. The patient verbalized understanding.    Molli Barrows, FNP Primary [facility] at [facility] [address] [place] [phone]: [phone]

## 2017-11-20 ENCOUNTER — Other Ambulatory Visit: Payer: Self-pay | Admitting: Nurse Practitioner

## 2017-11-20 ENCOUNTER — Other Ambulatory Visit (INDEPENDENT_AMBULATORY_CARE_PROVIDER_SITE_OTHER): Payer: Self-pay | Admitting: Radiology

## 2017-11-20 ENCOUNTER — Other Ambulatory Visit: Payer: Self-pay | Admitting: Internal Medicine

## 2017-11-20 ENCOUNTER — Telehealth (INDEPENDENT_AMBULATORY_CARE_PROVIDER_SITE_OTHER): Payer: Self-pay | Admitting: Orthopaedic Surgery

## 2017-11-20 ENCOUNTER — Telehealth: Payer: Self-pay | Admitting: Nurse Practitioner

## 2017-11-20 ENCOUNTER — Ambulatory Visit: Payer: Medicaid Other | Admitting: Family Medicine

## 2017-11-20 DIAGNOSIS — E785 Hyperlipidemia, unspecified: Principal | ICD-10-CM

## 2017-11-20 DIAGNOSIS — E1169 Type 2 diabetes mellitus with other specified complication: Secondary | ICD-10-CM

## 2017-11-20 MED ORDER — MUPIROCIN 2 % EX OINT: TOPICAL_OINTMENT | CUTANEOUS | 3 refills | Status: DC

## 2017-11-20 NOTE — Telephone Encounter (Signed)
Patients sister states Dr. Durward Fortes was going to call a cream in for patients blisters on his foot to [facility] on [address]. The pharmacy does not have an rx for this. Please cal lto advise.

## 2017-11-20 NOTE — Telephone Encounter (Signed)
Notified pt mupirocin was called into pharmacy

## 2017-11-20 NOTE — Telephone Encounter (Signed)
Patient's sister called stating patient needs refills on glipiZIDE (GLUCOTROL) 10 MG tablet [[id number]] and folic acid (FOLVITE) 1 MG tablet [[id number]]  Patient uses [facility], [place]

## 2017-11-23 ENCOUNTER — Ambulatory Visit (INDEPENDENT_AMBULATORY_CARE_PROVIDER_SITE_OTHER): Payer: MEDICAID | Admitting: Orthopaedic Surgery

## 2017-11-23 MED ORDER — GLIPIZIDE 10 MG PO TABS: ORAL_TABLET | ORAL | 0 refills | Status: DC

## 2017-11-27 ENCOUNTER — Ambulatory Visit: Payer: Medicaid Other | Admitting: Family Medicine

## 2017-11-29 ENCOUNTER — Other Ambulatory Visit: Payer: Self-pay | Admitting: Nurse Practitioner

## 2017-11-30 ENCOUNTER — Encounter (INDEPENDENT_AMBULATORY_CARE_PROVIDER_SITE_OTHER): Payer: Self-pay | Admitting: Orthopaedic Surgery

## 2017-11-30 ENCOUNTER — Ambulatory Visit (INDEPENDENT_AMBULATORY_CARE_PROVIDER_SITE_OTHER): Payer: Medicaid Other | Admitting: Orthopaedic Surgery

## 2017-11-30 VITALS — BP 96/57 | HR 75 | Ht 67.5 in | Wt 183.0 lb

## 2017-11-30 DIAGNOSIS — R238 Other skin changes: Secondary | ICD-10-CM | POA: Insufficient documentation

## 2017-11-30 NOTE — Progress Notes (Signed)
Office Visit Note   Patient: Eric Morton           Date of Birth: Aug 01, 1965           MRN: [id number] Visit Date: 11/30/2017              Requested by: [name], NP [place], [place] [address] PCP: [name], NP   Assessment & Plan: Visit Diagnoses:  1. Blisters of multiple sites     Plan: Diabetic peripheral vascular disease and neuropathy with superficial ulcers of right second toe and left great toe.  Stable.  Appear to be superficial continue with me appropriate [place] return in 2 weeks.  Continue with wooden shoes  Follow-Up Instructions: Return in about 2 weeks (around 12/14/2017).   Orders:  No orders of the defined types were placed in this encounter.  No orders of the defined types were placed in this encounter.     Procedures: No procedures performed   Clinical Data: No additional findings.   Subjective: Chief Complaint  Patient presents with  . Follow-up    R FOOT IS HEALING GOOD, LEFT FOOT SMALL BLISTER IS HAVING SOME DRAINAGE  Mr. Purdum returns for evaluation of the right foot ulcers as previously described.  He is not having any significant pain although he does have altered sensibility to his feet related to his diabetes.  He had difficulty obtaining the mupropicin  HPI  Review of Systems  Constitutional: Positive for fatigue. Negative for fever.  HENT: Negative for ear pain.   Eyes: Positive for pain.  Respiratory: Negative for cough and shortness of breath.   Cardiovascular: Positive for leg swelling.  Gastrointestinal: Negative for constipation and diarrhea.  Genitourinary: Negative for difficulty urinating.  Musculoskeletal: Positive for back pain. Negative for neck pain.  Skin: Positive for rash.  Allergic/Immunologic: Negative for food allergies.  Neurological: Negative for weakness and numbness.  Hematological: Does not bruise/bleed easily.  Psychiatric/Behavioral: Positive for sleep disturbance.      Objective: Vital Signs: BP (!) 96/57 (BP Location: Left Arm, Patient Position: Sitting, Cuff Size: Normal)   Pulse 75   Ht 5' 7.5" (1.715 m)   Wt 183 lb (83 kg)   BMI 28.24 kg/m   Physical Exam  Ortho Exam highway in a wheelchair.  Awake and alert.  No difficulty breathing no shortness of breath.  Ulcer of right second toe PIP joint is stable.  No drainage.  No swelling of the toe.  No redness Blisters of left great toe are decompressed.  No erythema about the blisters.  No edema.  Good capillary refill to the toe.  Blisters have not increased in length and are stable  Specialty Comments:  No specialty comments available.  Imaging: No results found.   PMFS History: Patient Active Problem List   Diagnosis Date Noted  . Blisters of multiple sites 11/30/2017  . Renal insufficiency 07/16/2017  . Hemiparesis affecting right side as late effect of stroke ([place]) 10/29/2016  . Left-sided weakness 10/23/2016  . Hyperlipidemia associated with type 2 diabetes mellitus ([place]) 07/27/2015  . History of CVA (cerebrovascular accident)   . Polycythemia vera ([place]) 03/21/2015  . Hx of adenomatous colonic polyps   . Essential hypertension 09/25/2011  . Type II diabetes mellitus ([place]) 09/25/2011  . Cocaine abuse ([place]) 09/25/2011  . Tobacco use 09/25/2011   Past Medical History:  Diagnosis Date  . 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  Anxiety   . Chronic kidney disease    "jsut a little damage"  . Glaucoma   . History of stomach ulcers    not confirmed, never underwent diagnostic endo or radiology, this manifested as epigastric distress.   [name] Hx of adenomatous colonic polyps   . Hypercholesterolemia   . Hypertension   . Peripheral neuropathy   . Polycythemia vera ([place]) 2006  . Renal insufficiency 07/16/2017  . Stroke [facility])    slurred speech  . Substance abuse ([place])   . Type II diabetes mellitus ([facility])     Family History  Problem Relation Age of Onset  .  Diabetes Mother   . Hypertension Mother     Past Surgical History:  Procedure Laterality Date  . COLONOSCOPY WITH PROPOFOL N/A 04/28/2014   Procedure: COLONOSCOPY WITH PROPOFOL;  Surgeon: Gatha Mayer, MD;  Location: [facility];  Service: Endoscopy;  Laterality: N/A;  . EYE SURGERY Right   . IRRIGATION AND DEBRIDEMENT ABSCESS  09/26/2011   Procedure: IRRIGATION AND DEBRIDEMENT ABSCESS;  Surgeon: Imogene Burn. Georgette Dover, MD;  Location: [facility];  Service: General;  Laterality: Left;  Incision and Drainage left breast abscess   Social History   Occupational History  . Not on file  Tobacco Use  . Smoking status: Former Smoker    Packs/day: 0.25    Years: 32.00    Pack years: 8.00    Types: Cigarettes  . Smokeless tobacco: Never Used  Substance and Sexual Activity  . Alcohol use: Yes    Comment: 3/14 quit drinking 5 days ago and then drank last nite  . Drug use: Yes    Types: Marijuana, "Crack" cocaine    Comment: snorts and smokes cocaine ocassionally   . Sexual activity: Yes

## 2017-12-01 ENCOUNTER — Telehealth: Payer: Self-pay

## 2017-12-01 NOTE — Telephone Encounter (Signed)
This CM spoke to Eric Morton/SCAT confirmed that she has part B of SCAT application but she has not heard back from the patient's sister about completing part A

## 2017-12-07 ENCOUNTER — Ambulatory Visit: Payer: Medicaid Other | Admitting: Nurse Practitioner

## 2017-12-14 ENCOUNTER — Ambulatory Visit (INDEPENDENT_AMBULATORY_CARE_PROVIDER_SITE_OTHER): Payer: Medicaid Other | Admitting: Orthopaedic Surgery

## 2017-12-22 ENCOUNTER — Encounter: Payer: Self-pay | Admitting: Nurse Practitioner

## 2017-12-22 ENCOUNTER — Other Ambulatory Visit: Payer: Self-pay | Admitting: Nurse Practitioner

## 2017-12-22 ENCOUNTER — Ambulatory Visit: Payer: Medicaid Other | Attending: Nurse Practitioner | Admitting: Nurse Practitioner

## 2017-12-22 VITALS — BP 95/61 | HR 76 | Temp 97.7°F

## 2017-12-22 DIAGNOSIS — N189 Chronic kidney disease, unspecified: Secondary | ICD-10-CM | POA: Diagnosis not present

## 2017-12-22 DIAGNOSIS — I10 Essential (primary) hypertension: Secondary | ICD-10-CM

## 2017-12-22 DIAGNOSIS — Z833 Family history of diabetes mellitus: Secondary | ICD-10-CM | POA: Diagnosis not present

## 2017-12-22 DIAGNOSIS — F339 Major depressive disorder, recurrent, unspecified: Secondary | ICD-10-CM | POA: Insufficient documentation

## 2017-12-22 DIAGNOSIS — E1142 Type 2 diabetes mellitus with diabetic polyneuropathy: Secondary | ICD-10-CM | POA: Diagnosis not present

## 2017-12-22 DIAGNOSIS — Z8249 Family history of ischemic heart disease and other diseases of the circulatory system: Secondary | ICD-10-CM | POA: Diagnosis not present

## 2017-12-22 DIAGNOSIS — I6930 Unspecified sequelae of cerebral infarction: Secondary | ICD-10-CM | POA: Insufficient documentation

## 2017-12-22 DIAGNOSIS — I129 Hypertensive chronic kidney disease with stage 1 through stage 4 chronic kidney disease, or unspecified chronic kidney disease: Secondary | ICD-10-CM | POA: Insufficient documentation

## 2017-12-22 DIAGNOSIS — K219 Gastro-esophageal reflux disease without esophagitis: Secondary | ICD-10-CM

## 2017-12-22 DIAGNOSIS — E1169 Type 2 diabetes mellitus with other specified complication: Secondary | ICD-10-CM | POA: Insufficient documentation

## 2017-12-22 DIAGNOSIS — Z7984 Long term (current) use of oral hypoglycemic drugs: Secondary | ICD-10-CM | POA: Insufficient documentation

## 2017-12-22 DIAGNOSIS — F419 Anxiety disorder, unspecified: Secondary | ICD-10-CM | POA: Diagnosis not present

## 2017-12-22 DIAGNOSIS — E1122 Type 2 diabetes mellitus with diabetic chronic kidney disease: Secondary | ICD-10-CM | POA: Diagnosis not present

## 2017-12-22 DIAGNOSIS — E785 Hyperlipidemia, unspecified: Secondary | ICD-10-CM | POA: Diagnosis not present

## 2017-12-22 DIAGNOSIS — Z888 Allergy status to other drugs, medicaments and biological substances status: Secondary | ICD-10-CM | POA: Insufficient documentation

## 2017-12-22 DIAGNOSIS — Z79899 Other long term (current) drug therapy: Secondary | ICD-10-CM | POA: Insufficient documentation

## 2017-12-22 DIAGNOSIS — E7800 Pure hypercholesterolemia, unspecified: Secondary | ICD-10-CM | POA: Insufficient documentation

## 2017-12-22 DIAGNOSIS — I69351 Hemiplegia and hemiparesis following cerebral infarction affecting right dominant side: Secondary | ICD-10-CM | POA: Insufficient documentation

## 2017-12-22 DIAGNOSIS — F330 Major depressive disorder, recurrent, mild: Secondary | ICD-10-CM

## 2017-12-22 LAB — POCT GLYCOSYLATED HEMOGLOBIN (HGB A1C): HEMOGLOBIN A1C: 7.2 % — AB (ref 4.0–5.6)

## 2017-12-22 LAB — GLUCOSE, POCT (MANUAL RESULT ENTRY): POC GLUCOSE: 103 mg/dL — AB (ref 70–99)

## 2017-12-22 MED ORDER — LISINOPRIL 20 MG PO TABS: 20.0000 mg | ORAL_TABLET | Freq: Every day | ORAL | 3 refills | Status: DC

## 2017-12-22 MED ORDER — GABAPENTIN 300 MG PO CAPS: 300.0000 mg | ORAL_CAPSULE | Freq: Three times a day (TID) | ORAL | 3 refills | Status: DC

## 2017-12-22 MED ORDER — GLIPIZIDE 10 MG PO TABS: 10.0000 mg | ORAL_TABLET | Freq: Two times a day (BID) | ORAL | 2 refills | Status: AC

## 2017-12-22 MED ORDER — ROSUVASTATIN CALCIUM 40 MG PO TABS: ORAL_TABLET | ORAL | 2 refills | Status: AC

## 2017-12-22 MED ORDER — FOLIC ACID 1 MG PO TABS: 1.0000 mg | ORAL_TABLET | Freq: Every day | ORAL | 2 refills | Status: DC

## 2017-12-22 MED ORDER — TRUEPLUS LANCETS 28G MISC: 1.0000 | Freq: Three times a day (TID) | 11 refills | Status: AC

## 2017-12-22 MED ORDER — CLOPIDOGREL BISULFATE 75 MG PO TABS: 75.0000 mg | ORAL_TABLET | Freq: Every day | ORAL | 2 refills | Status: AC

## 2017-12-22 MED ORDER — PANTOPRAZOLE SODIUM 40 MG PO TBEC: DELAYED_RELEASE_TABLET | ORAL | 2 refills | Status: AC

## 2017-12-22 MED ORDER — SERTRALINE HCL 25 MG PO TABS: ORAL_TABLET | ORAL | 1 refills | Status: DC

## 2017-12-22 MED ORDER — AMLODIPINE BESYLATE 5 MG PO TABS: 5.0000 mg | ORAL_TABLET | Freq: Every day | ORAL | 2 refills | Status: AC

## 2017-12-22 MED ORDER — GLUCOSE BLOOD VI STRP: 1.0000 | ORAL_STRIP | Freq: Three times a day (TID) | 12 refills | Status: AC

## 2017-12-22 NOTE — Progress Notes (Signed)
Assessment & Plan:  Eric Morton was seen today for follow-up.  Diagnoses and all orders for this visit:  Type 2 diabetes mellitus with diabetic polyneuropathy, without long-term current use of insulin (HCC) -     Glucose (CBG) -     HgB A1c -     glipiZIDE (GLUCOTROL) 10 MG tablet; Take 1 tablet (10 mg total) by mouth 2 (two) times daily before a meal. -     glucose blood (TRUE METRIX BLOOD GLUCOSE TEST) test strip; 1 each by Other route 3 (three) times daily. -     gabapentin (NEURONTIN) 300 MG capsule; Take 1 capsule (300 mg total) by mouth 3 (three) times daily. -     TRUEPLUS LANCETS 28G MISC; 1 each by Does not apply route 3 (three) times daily. Continue blood sugar control as discussed in office today, low carbohydrate diet, and regular physical exercise as tolerated, 150 minutes per week (30 min each day, 5 days per week, or 50 min 3 days per week). Keep blood sugar logs with fasting goal of 90-130 mg/dl, post prandial (after you eat) less than 180.  For Hypoglycemia: BS <60 and Hyperglycemia BS >400; contact the clinic ASAP. Annual eye exams and foot exams are recommended.  Hyperlipidemia associated with type 2 diabetes mellitus (HCC) -     rosuvastatin (CRESTOR) 40 MG tablet; TAKE 1 TABLET BY MOUTH DAILY AT 6 PM INSTRUCTIONS: Work on a low fat, heart healthy diet and participate in regular aerobic exercise program by working out at least 150 minutes per week; 5 days a week-30 minutes per day. Avoid red meat, fried foods. junk foods, sodas, sugary drinks, unhealthy snacking, alcohol and smoking.  Drink at least 48oz of water per day and monitor your carbohydrate intake daily.    Essential hypertension -     lisinopril (PRINIVIL,ZESTRIL) 20 MG tablet; Take 1 tablet (20 mg total) by mouth daily. -     amLODipine (NORVASC) 5 MG tablet; Take 1 tablet (5 mg total) by mouth daily. -     Comprehensive metabolic panel Continue all antihypertensives as prescribed.  Remember to bring in your  blood pressure log with you for your follow up appointment.  DASH/Mediterranean Diets are healthier choices for HTN.   Hemiparesis affecting right side as late effect of stroke (HCC) -     clopidogrel (PLAVIX) 75 MG tablet; Take 1 tablet (75 mg total) by mouth daily. -     folic acid (FOLVITE) 1 MG tablet; Take 1 tablet (1 mg total) by mouth daily.  Gastroesophageal reflux disease, esophagitis presence not specified -     pantoprazole (PROTONIX) 40 MG tablet; TAKE 1 TABLET(40 MG) BY MOUTH DAILY AT 6 AM -     CBC with Differential INSTRUCTIONS: Avoid GERD Triggers: acidic, spicy or fried foods, caffeine, coffee, sodas,  alcohol and chocolate.   Depression, major, recurrent, mild (HCC) -     sertraline (ZOLOFT) 25 MG tablet; TAKE 1 TABLET(25 MG) BY MOUTH DAILY    Patient has been counseled on age-appropriate routine health concerns for screening and prevention. These are reviewed and up-to-date. Referrals have been placed accordingly. Immunizations are up-to-date or declined.    Subjective:   Chief Complaint  Patient presents with  . Follow-up    Pt. is here for a follow-up.    HPI Eric Morton 52 y.o. male presents to office today for follow up to his chronic medical conditions. He has a history of stroke (cocaine abuse) with residual right  sided hemiparesis, poorly controlled DM II, most recently retinal detachment, PVD with neuropathy (seeeing orthopedics for foot ulcers), HTN, HPL,  and renal insufficiency. He is accompanied by his caregiver (sister) today. They are both interested in [place] moving out of his sister's house into a skilled care facility or ALF as he is in need of more complex care including rehab.    DM TYPE 2 A1c down from 13.2 to 7.2. However his sister states his blood glucose levels are still erratic and his dietary intake including excessive carbohydrates has not changed. She states he will not eat the healthy foods she tries to prepare for him. Current  medications include glipizide 42m BID. Blood glucose monitoring is 1-2 times per day. She denies any hypo or hyperglycemic symptoms.  He was taken off metformin due to renal insufficiency. He takes gabapentin for peripheral neuropathy. Lab Results  Component Value Date   HGBA1C 7.2 (A) 12/22/2017   Hyperlipidemia Patient presents for follow up to hyperlipidemia.  He is medication compliant taking crestor 457mdaily. He is not diet compliant and denies statin intolerance including myalgias. LDL at goal.  Lab Results  Component Value Date   CHOL 146 07/14/2017   Lab Results  Component Value Date   HDL 35 (L) 07/14/2017   Lab Results  Component Value Date   LDLCALC 62 07/14/2017   Lab Results  Component Value Date   TRIG 243 (H) 07/14/2017   Lab Results  Component Value Date   CHOLHDL 4.2 07/14/2017   Essential Hypertension Blood pressure is lower than optimal levels. I have stopped his hctz 2564mnd instructed him to continue lisinopril 93m75md amlodipine 5mg 41mly.  BP Readings from Last 3 Encounters:  12/22/17 95/61  11/30/17 (!) 96/57  11/19/17 (!) 103/59   Review of Systems  Constitutional: Negative for fever, malaise/fatigue and weight loss.  HENT: Negative for nosebleeds.        Slurred speech  Eyes: Negative.  Negative for blurred vision, double vision and photophobia.  Respiratory: Negative.  Negative for cough and shortness of breath.   Cardiovascular: Negative.  Negative for chest pain, palpitations and leg swelling.  Gastrointestinal: Positive for heartburn. Negative for nausea and vomiting.  Musculoskeletal: Negative.  Negative for myalgias.  Neurological: Positive for tremors, focal weakness and weakness. Negative for dizziness, seizures and headaches.  Psychiatric/Behavioral: Positive for depression. Negative for suicidal ideas. The patient has insomnia.     Past Medical History:  Diagnosis Date  . Alcohol liver damage (HCC)    "just a little damage"    . Anxiety   . Chronic kidney disease    "jsut a little damage"  . Glaucoma   . History of stomach ulcers    not confirmed, never underwent diagnostic endo or radiology, this manifested as epigastric distress.   . Hx Marland Kitchenf adenomatous colonic polyps   . Hypercholesterolemia   . Hypertension   . Peripheral neuropathy   . Polycythemia vera (HCC) [place]  . Renal insufficiency 07/16/2017  . [facility]  . Substance abuse (HCC) [place] Type II diabetes mellitus (HCC) [place] Past Surgical History:  Procedure Laterality Date  . COLONOSCOPY WITH PROPOFOL N/A 04/28/2014   Procedure: COLONOSCOPY WITH PROPOFOL;  Surgeon: Carl Gatha Mayer  [facility];  Laterality: N/A;  . EYE SURGERY Right   . IRRIGATION AND DEBRIDEMENT ABSCESS  09/26/2011   Procedure: IRRIGATION AND DEBRIDEMENT ABSCESS;  Surgeon: MatthRodman Key  Oren Section, MD;  Location: [facility];  Service: General;  Laterality: Left;  Incision and Drainage left breast abscess    Family History  Problem Relation Age of Onset  . Diabetes Mother   . Hypertension Mother     Social History Reviewed with no changes to be made today.   Outpatient Medications Prior to Visit  Medication Sig Dispense Refill  . Blood Glucose Monitoring Suppl (TRUE METRIX GO GLUCOSE METER) w/Device KIT USE AS DIRECTED  0  . hydrocortisone cream 1 % Apply topically 2 (two) times daily. 30 g 0  . mupirocin ointment (BACTROBAN) 2 % Apply 1-3 grams to affected area up to three times a day after cleaning 22 g 3  . traZODone (DESYREL) 50 MG tablet Take 2 tablets (100 mg total) by mouth at bedtime. 30 tablet 1  . amLODipine (NORVASC) 5 MG tablet Take 1 tablet (5 mg total) by mouth daily. 90 tablet 2  . clopidogrel (PLAVIX) 75 MG tablet Take 1 tablet (75 mg total) by mouth daily. 90 tablet 2  . folic acid (FOLVITE) 1 MG tablet TAKE 1 TABLET BY MOUTH DAILY 30 tablet 0  . glipiZIDE (GLUCOTROL) 10 MG tablet TAKE 1 TABLET(10 MG) BY MOUTH DAILY  BEFORE BREAKFAST. MUST MAKE APPT FOR FURTHER REFILLS 30 tablet 0  . glucose blood (TRUE METRIX BLOOD GLUCOSE TEST) test strip 1 each by Other route 3 (three) times daily. 100 each 12  . lisinopril-hydrochlorothiazide (PRINZIDE,ZESTORETIC) 20-25 MG tablet Take 1 tablet by mouth daily.  3  . Multiple Vitamin (MULTIVITAMIN WITH MINERALS) TABS tablet Take 1 tablet by mouth daily.    . pantoprazole (PROTONIX) 40 MG tablet TAKE 1 TABLET(40 MG) BY MOUTH DAILY AT 6 AM 90 tablet 0  . rosuvastatin (CRESTOR) 40 MG tablet TAKE 1 TABLET BY MOUTH DAILY AT 6 PM 30 tablet 2  . sertraline (ZOLOFT) 25 MG tablet TAKE 1 TABLET(25 MG) BY MOUTH DAILY 90 tablet 0  . TRUEPLUS LANCETS 28G MISC 1 each by Does not apply route 3 (three) times daily. 100 each 11  . gabapentin (NEURONTIN) 300 MG capsule Take 1 capsule (300 mg total) by mouth 3 (three) times daily. 90 capsule 3   No facility-administered medications prior to visit.     Allergies  Allergen Reactions  . Lipitor [Atorvastatin] Other (See Comments)    Sister states had to have Cardiac Surgery       Objective:    BP 95/61 (BP Location: Left Arm, Patient Position: Sitting, Cuff Size: Normal)   Pulse 76   Temp 97.7 F (36.5 C) (Oral)   SpO2 98%  Wt Readings from Last 3 Encounters:  11/30/17 183 lb (83 kg)  11/19/17 182 lb (82.6 kg)  11/18/17 182 lb (82.6 kg)    Physical Exam  Constitutional: He is oriented to person, place, and time. He appears well-developed and well-nourished. He is cooperative.  HENT:  Head: Normocephalic and atraumatic.  Eyes: EOM are normal.  Neck: Normal range of motion.  Cardiovascular: Normal rate, regular rhythm and normal heart sounds. Exam reveals no gallop and no friction rub.  No murmur heard. Pulmonary/Chest: Effort normal and breath sounds normal. No tachypnea. No respiratory distress. He has no decreased breath sounds. He has no wheezes. He has no rhonchi. He has no rales. He exhibits no tenderness.  Abdominal:  Bowel sounds are normal.  Musculoskeletal: He exhibits no edema.  Neurological: He is alert and oriented to person, place, and time. A cranial nerve deficit is  present. He exhibits abnormal muscle tone. Gait abnormal. Coordination normal.  Skin: Skin is warm and dry.  Psychiatric: He has a normal mood and affect. His behavior is normal. Judgment and thought content normal.  Nursing note and vitals reviewed.      Patient has been counseled extensively about nutrition and exercise as well as the importance of adherence with medications and regular follow-up. The patient was given clear instructions to go to ER or return to medical center if symptoms don't improve, worsen or new problems develop. The patient verbalized understanding.   Follow-up: Return in about 3 months (around 03/24/2018) for BP recheck; sister to call regarding bp readings.   [name], FNP-BC [facility] and [place], [place]   12/25/2017, 7:19 PM

## 2017-12-23 LAB — COMPREHENSIVE METABOLIC PANEL
A/G RATIO: 1.5 (ref 1.2–2.2)
ALT: 12 IU/L (ref 0–44)
AST: 13 IU/L (ref 0–40)
Albumin: 4.6 g/dL (ref 3.5–5.5)
Alkaline Phosphatase: 97 IU/L (ref 39–117)
BUN/Creatinine Ratio: 15 (ref 9–20)
BUN: 28 mg/dL — ABNORMAL HIGH (ref 6–24)
Bilirubin Total: 0.2 mg/dL (ref 0.0–1.2)
CALCIUM: 10 mg/dL (ref 8.7–10.2)
CO2: 19 mmol/L — ABNORMAL LOW (ref 20–29)
CREATININE: 1.92 mg/dL — AB (ref 0.76–1.27)
Chloride: 104 mmol/L (ref 96–106)
GFR, EST AFRICAN AMERICAN: 45 mL/min/{1.73_m2} — AB (ref >59)
GFR, EST NON AFRICAN AMERICAN: 39 mL/min/{1.73_m2} — AB (ref >59)
GLOBULIN, TOTAL: 3.1 g/dL (ref 1.5–4.5)
Glucose: 122 mg/dL — ABNORMAL HIGH (ref 65–99)
POTASSIUM: 4.1 mmol/L (ref 3.5–5.2)
SODIUM: 145 mmol/L — AB (ref 134–144)
TOTAL PROTEIN: 7.7 g/dL (ref 6.0–8.5)

## 2017-12-23 LAB — CBC WITH DIFFERENTIAL/PLATELET
Basophils Absolute: 0 10*3/uL (ref 0.0–0.2)
Basos: 1 %
EOS (ABSOLUTE): 0.3 10*3/uL (ref 0.0–0.4)
Eos: 7 %
HEMOGLOBIN: 11.5 g/dL — AB (ref 13.0–17.7)
Hematocrit: 34.9 % — ABNORMAL LOW (ref 37.5–51.0)
IMMATURE GRANS (ABS): 0 10*3/uL (ref 0.0–0.1)
Immature Granulocytes: 0 %
LYMPHS: 40 %
Lymphocytes Absolute: 1.7 10*3/uL (ref 0.7–3.1)
MCH: 28.5 pg (ref 26.6–33.0)
MCHC: 33 g/dL (ref 31.5–35.7)
MCV: 87 fL (ref 79–97)
MONOCYTES: 8 %
Monocytes Absolute: 0.3 10*3/uL (ref 0.1–0.9)
Neutrophils Absolute: 1.8 10*3/uL (ref 1.4–7.0)
Neutrophils: 44 %
PLATELETS: 206 10*3/uL (ref 150–450)
RBC: 4.03 x10E6/uL — AB (ref 4.14–5.80)
RDW: 12.6 % (ref 12.3–15.4)
WBC: 4.2 10*3/uL (ref 3.4–10.8)

## 2017-12-25 ENCOUNTER — Encounter: Payer: Self-pay | Admitting: Nurse Practitioner

## 2017-12-28 ENCOUNTER — Telehealth: Payer: Self-pay

## 2017-12-28 NOTE — Telephone Encounter (Signed)
This CM met with Joaquim Nam, RN/ Clinical Liaison with [facility] and discussed options for placement for the patient. She said that she would contact the patient's sister and plan to do an home assessment prior to completing the FL2.  Provided her with demographic information.

## 2017-12-29 ENCOUNTER — Other Ambulatory Visit: Payer: Self-pay

## 2017-12-29 ENCOUNTER — Telehealth: Payer: Self-pay

## 2017-12-29 DIAGNOSIS — I739 Peripheral vascular disease, unspecified: Secondary | ICD-10-CM

## 2017-12-29 DIAGNOSIS — L97919 Non-pressure chronic ulcer of unspecified part of right lower leg with unspecified severity: Secondary | ICD-10-CM

## 2017-12-29 DIAGNOSIS — L97929 Non-pressure chronic ulcer of unspecified part of left lower leg with unspecified severity: Secondary | ICD-10-CM

## 2017-12-29 NOTE — Telephone Encounter (Signed)
This encounter occurred 12/22/17:   This CM met with the patient and his sister, [name], when he was in the clinic today for his appointment.  [name] explained that the patient is not motivated to help himself.  She said that all he wants to do is sleep and have people wait on him.  She said that he a walker and can walk but does not exercise.  She continues to do the meal prep for him and give him his medications. He said that he has not fallen.   Discussed the options of facility placement  - short and long term. Also explained that he may not meet the criteria for skilled care or may possibly be appropriate for ALF at a point in the future. [name] would like him to be placed as soon as possible.  He said that he would like to wait until after the holidays but is agreeable to starting the process now.  The option of PACE was discussed as he will be 52 yo in January 2020. He could be out of the house daily for the program. However, his sister did not think that this would work for him as he would need help after PACE hours and she is tired of trying to help him and he does not make an effort to help himself. This CM explained that if he is placed in a facility , his monthly check would be turned over to the facility and both and he and his sister said that they understood.  The patient asked multiple times what long term means. This CM explained that it may mean a few months to few years or longer but this will depend on his ability to care for himself and /or assistance that he would have in the community. He then stated that he understood and was willing to progress with looking for placement. He had no facilities in mind that he would like to look at and his sister offered no options for placement at this time.  This CM offered the option of [facility] as they have 2 facilities in [place] and both the patient and [name] was in agreement to proceeding with attempting placement at [facility].  This CM also informed the patient and his sister that he can go to look at the facility.   Call placed to [facility], clinical liaison/[facility]. She explained that the [facility] can be faxed to her at # [phone] and he will be assessed for skilled care as well as long term care.   Update provided to Geryl Rankins, NP.

## 2018-01-01 ENCOUNTER — Telehealth: Payer: Self-pay

## 2018-01-01 NOTE — Telephone Encounter (Signed)
Message received from patient's sister, [name] , stating that since the nurse came out yesterday to evaluate her brother for the facility, he has not been eating and is refusing to take his medication.  She wanted Geryl Rankins, NP to be aware. She also stated that she wants him to be placed where he can get more assistance.   Call received from [facility], RN [place].  She explained that she did the home assessment yesterday and the patient became upset and did not want to go to a facility.  She even spoke to him about going for 30 days at at time.  He would need to commit to at least 30 days.  He doesn't want to go until after Christmas. Tammy stated that she spoke about the need for him to surrender his monthly check to the facility except for $30 and [name] mentioned that they already spent his check for this month. Tammy stated that his level of functioning is almost at ALF level. However he must be willing to go and at this time, he is not willing.  Tammy noted that she received a message from [place] with the same information that [name] told this CM.  Call placed to [facility]. She explained again that her brother is not eating and is refusing his medication. She said that she is not willing to continue to care for him as this is too stressful for her and she needs to care for herself. This CM explained to her that  Ms [name] , NP was notified of her call and at this time there is nothing that [facility] can do as far as placement if he is not willing to go.. She has the option of calling APS herself or she can take him to the hospital if his condition warrants.. She said that she would need to call EMS as he will not get in the car with her.    Update provided  to [facility], RN/[facility].

## 2018-01-03 NOTE — Telephone Encounter (Signed)
Thank you. Noted and agree with recommendations

## 2018-01-13 ENCOUNTER — Encounter (HOSPITAL_COMMUNITY): Payer: Medicaid Other

## 2018-01-13 ENCOUNTER — Encounter: Payer: Medicaid Other | Admitting: Vascular Surgery

## 2018-02-02 ENCOUNTER — Telehealth: Payer: Self-pay | Admitting: Nurse Practitioner

## 2018-02-02 ENCOUNTER — Telehealth (INDEPENDENT_AMBULATORY_CARE_PROVIDER_SITE_OTHER): Payer: Self-pay

## 2018-02-02 ENCOUNTER — Other Ambulatory Visit: Payer: Self-pay | Admitting: Nurse Practitioner

## 2018-02-02 DIAGNOSIS — D649 Anemia, unspecified: Secondary | ICD-10-CM

## 2018-02-02 NOTE — Telephone Encounter (Signed)
-----   Message from [name], NP sent at 01/24/2018  9:13 PM EST ----- Labs still show anemia. Mr. Codner will be referred to Gastroenterology for further recommendations and to evaluate for any possible intestinal issues. Also kidney function as decreased. We will need to return to have your kidney function checked in 6 weeks. Make sure you are drinking plenty of water and not taking any over the counter ibuprofen, aleve, advil or motrin.

## 2018-02-02 NOTE — Telephone Encounter (Signed)
Seems this may be a message meant for you. Thank you.

## 2018-02-02 NOTE — Telephone Encounter (Signed)
Pt called to request an update on a handicap Parking Placecard she dropped off and placed in inbox, please follow up

## 2018-02-02 NOTE — Telephone Encounter (Signed)
Patients sister is aware that labs still show anemia. Patient will be referred to gastroenterology for further recommendations and to evaluate for possible intestinal issues. Kidney function has decreased, will need to return in 6 weeks to have kidney function checked. Be sure to drink plenty of water and not take any OTC ibuprofen, aleve, advil or motrin. Referral has not been placed to gastro. Sister is asking if handicap placard form is ready for pick up. Please advise. Nat Christen, CMA

## 2018-02-02 NOTE — Telephone Encounter (Signed)
She needs to drop a handicap placard off at the office. Thank you

## 2018-02-03 NOTE — Telephone Encounter (Signed)
Patient was called and informed of paperwork being ready for pick up.

## 2018-02-08 ENCOUNTER — Other Ambulatory Visit: Payer: Self-pay | Admitting: Family Medicine

## 2018-02-19 ENCOUNTER — Other Ambulatory Visit: Payer: Self-pay | Admitting: Pharmacist

## 2018-02-19 MED ORDER — TRAZODONE HCL 50 MG PO TABS: 100.0000 mg | ORAL_TABLET | Freq: Every day | ORAL | 0 refills | Status: AC

## 2018-03-03 ENCOUNTER — Encounter (HOSPITAL_COMMUNITY): Payer: Medicaid Other

## 2018-03-10 ENCOUNTER — Encounter: Payer: Medicaid Other | Admitting: Vascular Surgery

## 2018-03-10 ENCOUNTER — Ambulatory Visit (HOSPITAL_COMMUNITY)
Admission: RE | Admit: 2018-03-10 | Discharge: 2018-03-10 | Disposition: A | Discharge status: Home / Self Care | Payer: Medicare (Managed Care) | Source: Ambulatory Visit | Attending: Vascular Surgery | Admitting: Vascular Surgery

## 2018-03-10 DIAGNOSIS — L97919 Non-pressure chronic ulcer of unspecified part of right lower leg with unspecified severity: Secondary | ICD-10-CM | POA: Insufficient documentation

## 2018-03-10 DIAGNOSIS — I739 Peripheral vascular disease, unspecified: Secondary | ICD-10-CM

## 2018-03-10 DIAGNOSIS — L97929 Non-pressure chronic ulcer of unspecified part of left lower leg with unspecified severity: Secondary | ICD-10-CM | POA: Diagnosis not present

## 2018-03-22 ENCOUNTER — Ambulatory Visit: Payer: Medicaid Other | Admitting: Nurse Practitioner

## 2018-03-24 ENCOUNTER — Ambulatory Visit: Payer: Medicaid Other | Admitting: Nurse Practitioner

## 2018-04-07 ENCOUNTER — Encounter: Payer: Self-pay | Admitting: Vascular Surgery

## 2018-04-07 ENCOUNTER — Other Ambulatory Visit: Payer: Self-pay

## 2018-04-07 ENCOUNTER — Ambulatory Visit (INDEPENDENT_AMBULATORY_CARE_PROVIDER_SITE_OTHER): Payer: Medicare (Managed Care) | Admitting: Vascular Surgery

## 2018-04-07 VITALS — BP 124/79 | HR 127 | Temp 98.1°F | Resp 16 | Ht 67.5 in | Wt 190.0 lb

## 2018-04-07 DIAGNOSIS — I70299 Other atherosclerosis of native arteries of extremities, unspecified extremity: Secondary | ICD-10-CM | POA: Diagnosis not present

## 2018-04-07 DIAGNOSIS — L97909 Non-pressure chronic ulcer of unspecified part of unspecified lower leg with unspecified severity: Secondary | ICD-10-CM | POA: Diagnosis not present

## 2018-04-07 NOTE — Progress Notes (Signed)
REASON FOR CONSULT:    Peripheral vascular disease with bilateral lower extremity ulcers.  The consult is requested by Dr. Joni Fears.  ASSESSMENT & PLAN:   PERIPHERAL VASCULAR DISEASE WITH ULCERS: This patient has evidence of infrainguinal arterial occlusive disease bilaterally.  However the toe pressure of 64 on the right and 59 on the left would suggest adequate circulation for healing.  He states that the wounds have made significant improvement.  He is not a smoker.  Given that the wounds have been improving I think we can hold off on arteriography.  However, if he shows any signs that the wounds are not continuing to heal, then I would recommend that we proceed with arteriography to evaluate his infrainguinal arterial occlusive disease.  We would have to do this with limited contrast given his stage III chronic kidney disease.  I will plan on seeing him back in 2 months.  He knows to call sooner if he has problems.  STAGE III CHRONIC KIDNEY DISEASE: If he does ultimately require arteriography it would likely need to be admitted for preoperative gentle hydration and we would have to do CO2 with limited contrast.  Deitra Mayo, MD, FACS Beeper [phone] Office: [phone]   HPI:   Eric Morton is a pleasant 53 y.o. male, who is referred with peripheral vascular disease and bilateral lower extremity wounds.  The patient states that he was wearing a shoe that did not fit correctly and about 3-1/2 months ago developed a wound on the medial left metatarsal head.  This is been treated aggressively as an outpatient and he states that it has definitely shown lots of improvement.  He denies any fever or chills.  I do not get any history of claudication although his activity is very limited because of his previous stroke.  He has significant right-sided weakness.  He denies any history of rest pain or nonhealing ulcers.  He is on aspirin, Plavix, and a statin.  I have reviewed the  record [facility] and [facility] . The patient has a history of type 2 diabetes complicated by diabetic polyneuropathy.  In addition the patient has a history of hyperlipidemia, essential hypertension, history of a previous CVA with right hemiparesis, depression, and gastroesophageal reflux disease.   Past Medical History:  Diagnosis Date  . 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 Anxiety   . Chronic kidney disease    "jsut a little damage"  . Glaucoma   . History of stomach ulcers    not confirmed, never underwent diagnostic endo or radiology, this manifested as epigastric distress.   [name] Hx of adenomatous colonic polyps   . Hypercholesterolemia   . Hypertension   . Peripheral neuropathy   . Polycythemia vera ([place]) 2006  . Renal insufficiency 07/16/2017  . Stroke [facility])    slurred speech  . Substance abuse ([place])   . Type II diabetes mellitus (HCC)     Family History  Problem Relation Age of Onset  . Diabetes Mother   . Hypertension Mother     SOCIAL HISTORY: Social History   Socioeconomic History  . Marital status: Widowed    Spouse name: Not on file  . Number of children: Not on file  . Years of education: Not on file  . Highest education level: Not on file  Occupational History  . Not on file  Social Needs  . Financial resource strain: Not on file  .  Food insecurity:    Worry: Not on file    Inability: Not on file  . Transportation needs:    Medical: Not on file    Non-medical: Not on file  Tobacco Use  . Smoking status: Former Smoker    Packs/day: 0.25    Years: 32.00    Pack years: 8.00    Types: Cigarettes  . Smokeless tobacco: Never Used  Substance and Sexual Activity  . Alcohol use: Yes    Comment: 3/14 quit drinking 5 days ago and then drank last nite  . Drug use: Yes    Types: Marijuana, "Crack" cocaine    Comment: snorts and smokes cocaine ocassionally   . Sexual activity: Yes  Lifestyle  . Physical  activity:    Days per week: Not on file    Minutes per session: Not on file  . Stress: Not on file  Relationships  . Social connections:    Talks on phone: Not on file    Gets together: Not on file    Attends religious service: Not on file    Active member of club or organization: Not on file    Attends meetings of clubs or organizations: Not on file    Relationship status: Not on file  . Intimate partner violence:    Fear of current or ex partner: Not on file    Emotionally abused: Not on file    Physically abused: Not on file    Forced sexual activity: Not on file  Other Topics Concern  . Not on file  Social History Narrative  . Not on file    Allergies  Allergen Reactions  . Lipitor [Atorvastatin] Other (See Comments)    Sister states had to have Cardiac Surgery    Current Outpatient Medications  Medication Sig Dispense Refill  . amLODipine (NORVASC) 5 MG tablet Take 1 tablet (5 mg total) by mouth daily. 90 tablet 2  . Blood Glucose Monitoring Suppl (TRUE METRIX GO GLUCOSE METER) w/Device KIT USE AS DIRECTED  0  . clopidogrel (PLAVIX) 75 MG tablet Take 1 tablet (75 mg total) by mouth daily. 90 tablet 2  . folic acid (FOLVITE) 1 MG tablet Take 1 tablet (1 mg total) by mouth daily. 90 tablet 2  . glucose blood (TRUE METRIX BLOOD GLUCOSE TEST) test strip 1 each by Other route 3 (three) times daily. 100 each 12  . hydrocortisone cream 1 % Apply topically 2 (two) times daily. 30 g 0  . lisinopril (PRINIVIL,ZESTRIL) 20 MG tablet Take 1 tablet (20 mg total) by mouth daily. 90 tablet 3  . mupirocin ointment (BACTROBAN) 2 % Apply 1-3 grams to affected area up to three times a day after cleaning 22 g 3  . pantoprazole (PROTONIX) 40 MG tablet TAKE 1 TABLET(40 MG) BY MOUTH DAILY AT 6 AM 90 tablet 2  . rosuvastatin (CRESTOR) 40 MG tablet TAKE 1 TABLET BY MOUTH DAILY AT 6 PM 90 tablet 2  . sertraline (ZOLOFT) 25 MG tablet TAKE 1 TABLET(25 MG) BY MOUTH DAILY 90 tablet 1  . traZODone  (DESYREL) 50 MG tablet Take 2 tablets (100 mg total) by mouth at bedtime. 60 tablet 0  . TRUEPLUS LANCETS 28G MISC 1 each by Does not apply route 3 (three) times daily. 100 each 11  . gabapentin (NEURONTIN) 300 MG capsule Take 1 capsule (300 mg total) by mouth 3 (three) times daily. 90 capsule 3  . glipiZIDE (GLUCOTROL) 10 MG tablet Take 1 tablet (10 mg  total) by mouth 2 (two) times daily before a meal. 180 tablet 2   No current facility-administered medications for this visit.     REVIEW OF SYSTEMS:  '[X]'$  denotes positive finding, '[ ]'$  denotes negative finding Cardiac  Comments:  Chest pain or chest pressure:    Shortness of breath upon exertion:    Short of breath when lying flat:    Irregular heart rhythm:        Vascular    Pain in calf, thigh, or hip brought on by ambulation:    Pain in feet at night that wakes you up from your sleep:     Blood clot in your veins:    Leg swelling:         Pulmonary    Oxygen at home:    Productive cough:     Wheezing:         Neurologic    Sudden weakness in arms or legs:  x   Sudden numbness in arms or legs:  x   Sudden onset of difficulty speaking or slurred speech:    Temporary loss of vision in one eye:     Problems with dizziness:         Gastrointestinal    Blood in stool:     Vomited blood:         Genitourinary    Burning when urinating:     Blood in urine:        Psychiatric    Major depression:         Hematologic    Bleeding problems:    Problems with blood clotting too easily:        Skin    Rashes or ulcers:        Constitutional    Fever or chills:     PHYSICAL EXAM:   Vitals:   04/07/18 1247  BP: 124/79  Pulse: (!) 127  Resp: 16  Temp: 98.1 F (36.7 C)  TempSrc: Oral  SpO2: 99%  Weight: 190 lb (86.2 kg)  Height: 5' 7.5" (1.715 m)   GENERAL: The patient is a well-nourished male, in no acute distress. The vital signs are documented above. CARDIAC: There is a regular rate and rhythm.  VASCULAR: I  do not detect carotid bruits. He has palpable femoral pulses bilaterally.  I cannot palpate popliteal or pedal pulses. He has fairly brisk Doppler signals in the dorsalis pedis and posterior tibial positions bilaterally. PULMONARY: There is good air exchange bilaterally without wheezing or rales. ABDOMEN: Soft and non-tender with normal pitched bowel sounds.  I do not palpate an abdominal aortic aneurysm. MUSCULOSKELETAL: There are no major deformities or cyanosis. NEUROLOGIC: No focal weakness or paresthesias are detected. SKIN: He has a very superficial ulceration on the dorsum of his right second toe.  He has a small wound on the medial aspect of his left metatarsal head of the first toe.  There is no drainage or erythema associated with this. PSYCHIATRIC: The patient has a normal affect.  DATA:    ARTERIAL DOPPLER STUDY: I have reviewed the arterial Doppler study that was done on 03/10/2018.  On the right side there was a biphasic posterior tibial signal with a monophasic dorsalis pedis signal.  ABI was 87% and toe pressure was 64 mmHg.  On the left side there was a triphasic dorsalis pedis and posterior tibial signal.  ABI was 88%.  Toe pressure was 59 mmHg.  LABS: On 12/22/2017, creatinine was 1.92.  GFR  45 

## 2018-05-27 ENCOUNTER — Other Ambulatory Visit: Payer: Self-pay

## 2018-05-27 ENCOUNTER — Ambulatory Visit: Payer: Self-pay | Admitting: *Deleted

## 2018-05-27 ENCOUNTER — Emergency Department (HOSPITAL_COMMUNITY)
Admission: EM | Admit: 2018-05-27 | Discharge: 2018-05-27 | Disposition: A | Discharge status: Home / Self Care | Payer: Medicare (Managed Care) | Attending: Emergency Medicine | Admitting: Emergency Medicine

## 2018-05-27 DIAGNOSIS — E1165 Type 2 diabetes mellitus with hyperglycemia: Secondary | ICD-10-CM | POA: Insufficient documentation

## 2018-05-27 DIAGNOSIS — R739 Hyperglycemia, unspecified: Secondary | ICD-10-CM

## 2018-05-27 DIAGNOSIS — R300 Dysuria: Secondary | ICD-10-CM | POA: Insufficient documentation

## 2018-05-27 DIAGNOSIS — F1290 Cannabis use, unspecified, uncomplicated: Secondary | ICD-10-CM | POA: Insufficient documentation

## 2018-05-27 DIAGNOSIS — I129 Hypertensive chronic kidney disease with stage 1 through stage 4 chronic kidney disease, or unspecified chronic kidney disease: Secondary | ICD-10-CM | POA: Diagnosis not present

## 2018-05-27 DIAGNOSIS — Z794 Long term (current) use of insulin: Secondary | ICD-10-CM | POA: Diagnosis not present

## 2018-05-27 DIAGNOSIS — R350 Frequency of micturition: Secondary | ICD-10-CM | POA: Insufficient documentation

## 2018-05-27 DIAGNOSIS — F1490 Cocaine use, unspecified, uncomplicated: Secondary | ICD-10-CM | POA: Insufficient documentation

## 2018-05-27 DIAGNOSIS — K709 Alcoholic liver disease, unspecified: Secondary | ICD-10-CM | POA: Insufficient documentation

## 2018-05-27 DIAGNOSIS — N189 Chronic kidney disease, unspecified: Secondary | ICD-10-CM | POA: Insufficient documentation

## 2018-05-27 DIAGNOSIS — Z79899 Other long term (current) drug therapy: Secondary | ICD-10-CM | POA: Insufficient documentation

## 2018-05-27 LAB — URINALYSIS, ROUTINE W REFLEX MICROSCOPIC
Bacteria, UA: NONE SEEN
Bilirubin Urine: NEGATIVE
Glucose, UA: >=500 mg/dL — AB
Ketones, ur: NEGATIVE mg/dL
Leukocytes,Ua: NEGATIVE
Nitrite: NEGATIVE
Protein, ur: 30 mg/dL — AB
Specific Gravity, Urine: 1.017 (ref 1.005–1.030)
pH: 6 (ref 5.0–8.0)

## 2018-05-27 LAB — COMPREHENSIVE METABOLIC PANEL
ALT: 16 U/L (ref 0–44)
AST: 26 U/L (ref 15–41)
Albumin: 3.4 g/dL — ABNORMAL LOW (ref 3.5–5.0)
Alkaline Phosphatase: 122 U/L (ref 38–126)
Anion gap: 11 (ref 5–15)
BUN: 29 mg/dL — ABNORMAL HIGH (ref 6–20)
CO2: 20 mmol/L — ABNORMAL LOW (ref 22–32)
Calcium: 9.3 mg/dL (ref 8.9–10.3)
Chloride: 104 mmol/L (ref 98–111)
Creatinine, Ser: 2.21 mg/dL — ABNORMAL HIGH (ref 0.61–1.24)
GFR calc Af Amer: 37 mL/min — ABNORMAL LOW (ref >60)
GFR calc non Af Amer: 32 mL/min — ABNORMAL LOW (ref >60)
Glucose, Bld: 510 mg/dL (ref 70–99)
Potassium: 4.7 mmol/L (ref 3.5–5.1)
Sodium: 135 mmol/L (ref 135–145)
Total Bilirubin: 0.6 mg/dL (ref 0.3–1.2)
Total Protein: 6.6 g/dL (ref 6.5–8.1)

## 2018-05-27 LAB — CBG MONITORING, ED
Glucose-Capillary: 467 mg/dL — ABNORMAL HIGH (ref 70–99)
Glucose-Capillary: 360 mg/dL — ABNORMAL HIGH (ref 70–99)
Glucose-Capillary: 404 mg/dL — ABNORMAL HIGH (ref 70–99)
Glucose-Capillary: 307 mg/dL — ABNORMAL HIGH (ref 70–99)
Glucose-Capillary: 257 mg/dL — ABNORMAL HIGH (ref 70–99)

## 2018-05-27 LAB — CBC WITH DIFFERENTIAL/PLATELET
Abs Immature Granulocytes: 0.02 10*3/uL (ref 0.00–0.07)
Basophils Absolute: 0 10*3/uL (ref 0.0–0.1)
Basophils Relative: 1 %
Eosinophils Absolute: 0.3 10*3/uL (ref 0.0–0.5)
Eosinophils Relative: 6 %
HCT: 33.3 % — ABNORMAL LOW (ref 39.0–52.0)
Hemoglobin: 10.7 g/dL — ABNORMAL LOW (ref 13.0–17.0)
Immature Granulocytes: 0 %
Lymphocytes Relative: 32 %
Lymphs Abs: 1.7 10*3/uL (ref 0.7–4.0)
MCH: 27.9 pg (ref 26.0–34.0)
MCHC: 32.1 g/dL (ref 30.0–36.0)
MCV: 86.7 fL (ref 80.0–100.0)
Monocytes Absolute: 0.5 10*3/uL (ref 0.1–1.0)
Monocytes Relative: 9 %
Neutro Abs: 2.8 10*3/uL (ref 1.7–7.7)
Neutrophils Relative %: 52 %
Platelets: 184 10*3/uL (ref 150–400)
RBC: 3.84 MIL/uL — ABNORMAL LOW (ref 4.22–5.81)
RDW: 12.1 % (ref 11.5–15.5)
WBC: 5.3 10*3/uL (ref 4.0–10.5)
nRBC: 0 % (ref 0.0–0.2)

## 2018-05-27 MED ORDER — SODIUM CHLORIDE 0.9 % IV SOLN: INTRAVENOUS | Status: DC

## 2018-05-27 MED ORDER — SODIUM CHLORIDE 0.9 % IV BOLUS
1000.0000 mL | Freq: Once | INTRAVENOUS | Status: AC
  Administered 2018-05-27: 17:00:00 1000 mL via INTRAVENOUS

## 2018-05-27 MED ORDER — SODIUM CHLORIDE 0.9 % IV BOLUS
2000.0000 mL | Freq: Once | INTRAVENOUS | Status: AC
  Administered 2018-05-27: 2000 mL via INTRAVENOUS

## 2018-05-27 MED ORDER — DEXTROSE-NACL 5-0.45 % IV SOLN: INTRAVENOUS | Status: DC

## 2018-05-27 MED ORDER — INSULIN REGULAR(HUMAN) IN NACL 100-0.9 UT/100ML-% IV SOLN
INTRAVENOUS | Status: DC
  Administered 2018-05-27: 16:00:00 3 [IU]/h via INTRAVENOUS
  Filled 2018-05-27: qty 100

## 2018-05-27 NOTE — ED Provider Notes (Addendum)
[facility] EMERGENCY DEPARTMENT Provider Note   CSN: [id number] Arrival date & time: 05/27/18  1220    History   Chief Complaint Chief Complaint  Patient presents with  . Hyperglycemia    HPI Eric Barona MARSIGLIA is a 53 y.o. male with a pmh of alcoholic liver disease, CKD, R eye blindness, polycythemia vera, glaucoma, HTN, HLD, stroke with speech difficulty, and type II IDDM. Patient sent in by his sister who is currently caring for him for evaluation of persistent hyperglycemia. The patient was given patient had a FBG of 577 yesterday and 441 this morning. He was given Novolog 8 U, Basaglar 15 U, and 10 mg Glipizide with no change in his blood sugar. The patient admits to polyuria over the past several days and has felt some burning along with "strong smell" to his urine.  He denies fevere, cp, sob, cough, fatigue,  Malaise, myalgias, cp, abdominal pain or flank pain.     The history is provided by the patient, the EMS personnel and medical records. History limited by: speech difficulties.  Hyperglycemia  Blood sugar level PTA:  441 Severity:  Moderate Onset quality:  Gradual Duration:  4 days Timing:  Constant Progression:  Unchanged Chronicity:  Chronic Diabetes status:  Controlled with insulin and controlled with oral medications Current diabetic therapy:  Novolog, basaglar, glipizide Time since last antidiabetic medication:  2 hours Context: not change in medication, not insulin pump use, not new diabetes diagnosis, not noncompliance, not recent change in diet and not recent illness   Relieved by:  Nothing Ineffective treatments:  Oral agents and insulin Associated symptoms: dysuria and polyuria   Associated symptoms: no abdominal pain, no altered mental status, no blurred vision, no chest pain, no confusion, no dehydration, no diaphoresis, no dizziness, no fatigue, no fever, no increased appetite, no increased thirst, no malaise, no nausea, no shortness of  breath, no syncope, no vomiting, no weakness and no weight change   Risk factors: no recent steroid use     Past Medical History:  Diagnosis Date  . 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 Anxiety   . Chronic kidney disease    "jsut a little damage"  . Glaucoma   . History of stomach ulcers    not confirmed, never underwent diagnostic endo or radiology, this manifested as epigastric distress.   [name] Hx of adenomatous colonic polyps   . Hypercholesterolemia   . Hypertension   . Peripheral neuropathy   . Polycythemia vera ([place]) 2006  . Renal insufficiency 07/16/2017  . Stroke [facility])    slurred speech  . Substance abuse ([place])   . Type II diabetes mellitus [facility])     Patient Active Problem List   Diagnosis Date Noted  . Blisters of multiple sites 11/30/2017  . Renal insufficiency 07/16/2017  . Hemiparesis affecting right side as late effect of stroke ([place]) 10/29/2016  . Left-sided weakness 10/23/2016  . Hyperlipidemia associated with type 2 diabetes mellitus ([place]) 07/27/2015  . History of CVA (cerebrovascular accident)   . Polycythemia vera ([place]) 03/21/2015  . Hx of adenomatous colonic polyps   . Essential hypertension 09/25/2011  . Type II diabetes mellitus ([place]) 09/25/2011  . Cocaine abuse ([place]) 09/25/2011  . Tobacco use 09/25/2011    Past Surgical History:  Procedure Laterality Date  . COLONOSCOPY WITH PROPOFOL N/A 04/28/2014   Procedure: COLONOSCOPY WITH PROPOFOL;  Surgeon: Gatha Mayer, MD;  Location: [facility];  Service: Endoscopy;  Laterality: N/A;  . EYE SURGERY Right   . IRRIGATION AND DEBRIDEMENT ABSCESS  09/26/2011   Procedure: IRRIGATION AND DEBRIDEMENT ABSCESS;  Surgeon: Imogene Burn. Georgette Dover, MD;  Location: [facility];  Service: General;  Laterality: Left;  Incision and Drainage left breast abscess        Home Medications    Prior to Admission medications   Medication Sig Start Date End Date Taking? Authorizing Provider  acetaminophen (TYLENOL)  325 MG tablet Take 650 mg by mouth every 6 (six) hours as needed for mild pain.   Yes [provider]  amLODipine (NORVASC) 5 MG tablet Take 1 tablet (5 mg total) by mouth daily. 12/22/17  Yes [name], NP  aspirin 81 MG chewable tablet Chew 81 mg by mouth daily.   Yes [provider]  atropine 1 % ophthalmic solution Place 1 drop into the right eye 2 (two) times a day. 01/19/18  Yes [provider]  clopidogrel (PLAVIX) 75 MG tablet Take 1 tablet (75 mg total) by mouth daily. 12/22/17  Yes [name], NP  gabapentin (NEURONTIN) 300 MG capsule Take 1 capsule (300 mg total) by mouth 3 (three) times daily. Patient taking differently: Take 300 mg by mouth 2 (two) times a day.  12/22/17 05/27/18 Yes [name], NP  glipiZIDE (GLUCOTROL) 10 MG tablet Take 1 tablet (10 mg total) by mouth 2 (two) times daily before a meal. 12/22/17 05/27/18 Yes [name], NP  insulin aspart (NOVOLOG) 100 UNIT/ML injection Inject 2-8 Units into the skin daily as needed for high blood sugar.   Yes [provider]  Insulin Glargine (BASAGLAR KWIKPEN) 100 UNIT/ML SOPN Inject 15 Units into the skin daily.   Yes [provider]  lisinopril-hydrochlorothiazide (ZESTORETIC) 20-25 MG tablet Take 1 tablet by mouth daily. 01/09/18  Yes [provider]  Melatonin 5 MG TABS Take 10 mg by mouth at bedtime.   Yes [provider]  Multiple Vitamins-Minerals (CEROVITE PO) Take 1 tablet by mouth daily.   Yes [provider]  pantoprazole (PROTONIX) 40 MG tablet TAKE 1 TABLET(40 MG) BY MOUTH DAILY AT 6 AM Patient taking differently: Take 40 mg by mouth daily.  12/22/17  Yes [name], NP  prednisoLONE acetate (PRED FORTE) 1 % ophthalmic suspension Place 1 drop into the right eye 4 (four) times daily. 01/19/18  Yes [provider]  rosuvastatin (CRESTOR) 40 MG tablet TAKE 1 TABLET BY MOUTH DAILY AT 6 PM Patient taking differently:  Take 40 mg by mouth daily.  12/22/17  Yes [name], NP  sennosides-docusate sodium (SENOKOT-S) 8.6-50 MG tablet Take 1 tablet by mouth daily.   Yes [provider]  Blood Glucose Monitoring Suppl (TRUE METRIX GO GLUCOSE METER) w/Device KIT USE AS DIRECTED 12/12/16   [provider]  glucose blood (TRUE METRIX BLOOD GLUCOSE TEST) test strip 1 each by Other route 3 (three) times daily. 12/22/17   [name], NP  traZODone (DESYREL) 50 MG tablet Take 2 tablets (100 mg total) by mouth at bedtime. Patient not taking: Reported on 05/27/2018 02/19/18   Charlott Rakes, MD  TRUEPLUS LANCETS 28G MISC 1 each by Does not apply route 3 (three) times daily. 12/22/17   [name], NP    Family History Family History  Problem Relation Age of Onset  . Diabetes Mother   . Hypertension Mother     Social History Social History   Tobacco Use  . Smoking status:  Former Smoker    Packs/day: 0.25    Years: 32.00    Pack years: 8.00    Types: Cigarettes  . Smokeless tobacco: Never Used  Substance Use Topics  . Alcohol use: Yes    Comment: 3/14 quit drinking 5 days ago and then drank last nite  . Drug use: Yes    Types: Marijuana, "Crack" cocaine    Comment: snorts and smokes cocaine ocassionally      Allergies   Lipitor [atorvastatin]   Review of Systems Review of Systems  Constitutional: Negative for diaphoresis, fatigue and fever.  HENT: Negative.   Eyes: Negative.  Negative for blurred vision.  Respiratory: Negative.  Negative for cough, chest tightness and shortness of breath.   Cardiovascular: Negative.  Negative for chest pain and syncope.  Gastrointestinal: Negative.  Negative for abdominal pain, nausea and vomiting.  Endocrine: Positive for polyuria. Negative for polydipsia.  Genitourinary: Positive for dysuria.  Musculoskeletal: Negative.  Negative for myalgias.  Skin: Negative.   Neurological: Negative.  Negative for dizziness and weakness.   Psychiatric/Behavioral: Negative for confusion.  All other systems reviewed and are negative.    Physical Exam Updated Vital Signs BP (!) 151/98   Pulse 81   Temp 97.7 F (36.5 C) (Oral)   Resp 16   SpO2 100%   Physical Exam Vitals signs and nursing note reviewed.  Constitutional:      General: He is not in acute distress.    Appearance: He is well-developed. He is not diaphoretic.  HENT:     Head: Normocephalic and atraumatic.  Eyes:     General: No scleral icterus.    Conjunctiva/sclera: Conjunctivae normal.     Comments: R eye with lens opacity, Pupil dilated  Neck:     Musculoskeletal: Normal range of motion and neck supple.  Cardiovascular:     Rate and Rhythm: Normal rate and regular rhythm.     Heart sounds: Normal heart sounds.  Pulmonary:     Effort: Pulmonary effort is normal. No respiratory distress.     Breath sounds: Normal breath sounds.  Abdominal:     General: Abdomen is flat.     Palpations: Abdomen is soft.     Tenderness: There is no abdominal tenderness.  Skin:    General: Skin is warm and dry.  Neurological:     Mental Status: He is alert and oriented to person, place, and time.  Psychiatric:        Behavior: Behavior normal.      ED Treatments / Results  Labs (all labs ordered are listed, but only abnormal results are displayed) Labs Reviewed  URINALYSIS, ROUTINE W REFLEX MICROSCOPIC - Abnormal; Notable for the following components:      Result Value   Color, Urine STRAW (*)    Glucose, UA >=500 (*)    Hgb urine dipstick SMALL (*)    Protein, ur 30 (*)    All other components within normal limits  COMPREHENSIVE METABOLIC PANEL - Abnormal; Notable for the following components:   CO2 20 (*)    Glucose, Bld 510 (*)    BUN 29 (*)    Creatinine, Ser 2.21 (*)    Albumin 3.4 (*)    GFR calc non Af Amer 32 (*)    GFR calc Af Amer 37 (*)    All other components within normal limits  CBC WITH DIFFERENTIAL/PLATELET - Abnormal; Notable for  the following components:   RBC 3.84 (*)    Hemoglobin 10.7 (*)  HCT 33.3 (*)    All other components within normal limits  CBG MONITORING, ED - Abnormal; Notable for the following components:   Glucose-Capillary 467 (*)    All other components within normal limits  CBG MONITORING, ED - Abnormal; Notable for the following components:   Glucose-Capillary 360 (*)    All other components within normal limits  CBG MONITORING, ED - Abnormal; Notable for the following components:   Glucose-Capillary 404 (*)    All other components within normal limits  CBG MONITORING, ED - Abnormal; Notable for the following components:   Glucose-Capillary 307 (*)    All other components within normal limits  CBG MONITORING, ED - Abnormal; Notable for the following components:   Glucose-Capillary 257 (*)    All other components within normal limits    EKG None  Radiology No results found.  Procedures .Critical Care Performed by: Margarita Mail, PA-C Authorized by: Margarita Mail, PA-C   Critical care provider statement:    Critical care time (minutes):  45   Critical care was necessary to treat or prevent imminent or life-threatening deterioration of the following conditions:  Metabolic crisis   Critical care was time spent personally by me on the following activities:  Discussions with consultants, evaluation of patient's response to treatment, examination of patient, ordering and performing treatments and interventions, ordering and review of laboratory studies, ordering and review of radiographic studies, pulse oximetry, re-evaluation of patient's condition, obtaining history from patient or surrogate and review of old charts   (including critical care time)  Medications Ordered in ED Medications  sodium chloride 0.9 % bolus 2,000 mL (0 mLs Intravenous Stopped 05/27/18 1712)  sodium chloride 0.9 % bolus 1,000 mL (0 mLs Intravenous Stopped 05/27/18 1814)     Initial Impression /  Assessment and Plan / ED Course  I have reviewed the triage vital signs and the nursing notes.  Pertinent labs & imaging results that were available during my care of the patient were reviewed by me and considered in my medical decision making (see chart for details).  Clinical Course as of May 28 1017  Thu May 27, 2018  1500 Creatinine(!): 2.21 [AH]  1500 Anion gap: 11 [AH]    Clinical Course User Index [AH] Margarita Mail, PA-C       Patient with Hyperglycemia. BP (!) 151/98   Pulse 81   Temp 97.7 F (36.5 C) (Oral)   Resp 16   SpO2 100%  No UTI, No leukocytosis, No evidence of Metabolic acidosis. Blood sugars decreased using IV Insulin drip. 257 at discharge.  No chest pain. Patient advised to follow very closely with pcp to adjust meds.  Information relayed to sister who is currently caring for him. Discussed return precautions.  Final Clinical Impressions(s) / ED Diagnoses   Final diagnoses:  Hyperglycemia    ED Discharge Orders    None       Margarita Mail, PA-C 05/29/18 1019    Elnora Morrison, MD 05/29/18 Grimes, Marquez, PA-C 06/12/18 1224    Elnora Morrison, MD 06/14/18 [phone]

## 2018-05-27 NOTE — ED Notes (Signed)
This RN contacted sister, Corey Harold notified that pt is unable to be transported by sister due to inability to walk upstairs at apartment.

## 2018-05-27 NOTE — Discharge Instructions (Signed)
°  Get help right away if: °You have difficulty breathing. °You have a change in how you think, feel, or act (mental status). °You have nausea or vomiting that does not go away. °

## 2018-05-27 NOTE — ED Notes (Signed)
IV team at bedside 

## 2018-05-27 NOTE — ED Notes (Signed)
Pt is waiting on PTAR.

## 2018-05-27 NOTE — ED Notes (Signed)
This RN informed pt's sister that he will be discharged and be arriving via [facility].

## 2018-05-27 NOTE — ED Notes (Addendum)
This RN is acting as Art therapist and asked pt if he would like any family called. Pt requested his sister, Eric Morton be called. I spoke with his sister and updated her on pt's condition. Pt's sister states that his CBG has been very high over the past week, unable to control it at home. RN will continue to update family as more information is available.

## 2018-05-27 NOTE — ED Triage Notes (Signed)
Pt here via EMS from sister's house where he lives. Pt has been hyperglycemic for a few days, they've tried increasing his insulin without improvement. Endorses dizziness and polyuria.

## 2018-05-27 NOTE — ED Notes (Signed)
Gave pt cheese and crackers

## 2018-05-27 NOTE — Telephone Encounter (Signed)
Patient's sister Mrs.Lewis is caring for patient. CBG's runny high for several days. yesterday 577. Today 441 fasting gave novolog SSI 8u and basaglar 15u. Took glipizide 10 mg tab. One hour later cbg remains 441.   Answer Assessment - Initial Assessment Questions 1. BLOOD GLUCOSE: "What is your blood glucose level?"      441 now 2. ONSET: "When did you check the blood glucose?"     now 3. USUAL RANGE: "What is your glucose level usually?" (e.g., usual fasting morning value, usual evening value)     Recently has been 300-500's. 4. KETONES: "Do you check for ketones (urine or blood test strips)?" If yes, ask: "What does the test show now?"      no 5. TYPE 1 or 2:  "Do you know what type of diabetes you have?"  (e.g., Type 1, Type 2, Gestational; doesn't know)      2 6. INSULIN: "Do you take insulin?" "What type of insulin(s) do you use? What is the mode of delivery? (syringe, pen (e.g., injection or  pump)?"      basiglar and novolog 7. DIABETES PILLS: "Do you take any pills for your diabetes?" If yes, ask: "Have you missed taking any pills recently?"     Glipizide  8. OTHER SYMPTOMS: "Do you have any symptoms?" (e.g., fever, frequent urination, difficulty breathing, dizziness, weakness, vomiting)     lethargic 9. PREGNANCY: "Is there any chance you are pregnant?" "When was your last menstrual period?"     na  Protocols used: DIABETES - HIGH BLOOD SUGAR-A-AH

## 2018-06-09 ENCOUNTER — Encounter: Payer: Self-pay | Admitting: Family

## 2018-06-09 ENCOUNTER — Ambulatory Visit: Payer: Medicaid Other | Admitting: Vascular Surgery

## 2018-06-22 ENCOUNTER — Telehealth (HOSPITAL_COMMUNITY): Payer: Self-pay | Admitting: Rehabilitation

## 2018-06-22 NOTE — Telephone Encounter (Signed)
The above patient or their representative was contacted and gave the following answers to these questions:         Do you have any of the following symptoms? No  Fever                    Cough                   Shortness of breath  Do  you have any of the following other symptoms? No   muscle pain         vomiting,        diarrhea        rash         weakness        red eye        abdominal pain         bruising          bruising or bleeding              joint pain           severe headache    Have you been in contact with someone who was or has been sick in the past 2 weeks? No  Yes                 Unsure                         Unable to assess   Does the person that you were in contact with have any of the following symptoms?   Cough         shortness of breath           muscle pain         vomiting,            diarrhea            rash            weakness           fever            red eye           abdominal pain           bruising  or  bleeding                joint pain                severe headache               Have you  or someone you have been in contact with traveled internationally in th last month? No        If yes, which countries?   Have you  or someone you have been in contact with traveled outside [place] in th last month? No         If yes, which state and city?   COMMENTS OR ACTION PLAN FOR THIS PATIENT:          

## 2018-06-23 ENCOUNTER — Ambulatory Visit (INDEPENDENT_AMBULATORY_CARE_PROVIDER_SITE_OTHER): Payer: Medicare (Managed Care) | Admitting: Vascular Surgery

## 2018-06-23 ENCOUNTER — Other Ambulatory Visit: Payer: Self-pay

## 2018-06-23 ENCOUNTER — Encounter: Payer: Self-pay | Admitting: Vascular Surgery

## 2018-06-23 VITALS — BP 119/75 | HR 79 | Temp 97.6°F | Resp 20 | Ht 67.5 in | Wt 189.0 lb

## 2018-06-23 DIAGNOSIS — L97909 Non-pressure chronic ulcer of unspecified part of unspecified lower leg with unspecified severity: Secondary | ICD-10-CM

## 2018-06-23 DIAGNOSIS — I70299 Other atherosclerosis of native arteries of extremities, unspecified extremity: Secondary | ICD-10-CM | POA: Diagnosis not present

## 2018-06-23 NOTE — Progress Notes (Signed)
Patient name: Eric Morton MRN: [id number] DOB: [birth year] Sex: male  REASON FOR VISIT:   Critical limb ischemia.  HPI:   Eric Morton is a pleasant 53 y.o. male who I last saw on 04/07/2018.  The patient had a wound on the medial left metatarsal head and also a wound on the dorsum of the right second toe.  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  Toe pressure on the right was 64 mmHg and on the left 59 mmHg.  This suggested adequate circulation for healing of his wounds.  This reason I felt we can hold off on arteriography.  He does have stage III chronic kidney disease and his arteriogram would have to be done with limited contrast and CO2.  He comes in for a 14-monthfollow-up visit.  Since I saw him last his wounds have improved significantly.  The wound on the left medial metatarsal head is almost completely healed.  In the wound on the right second toe is dry with no open ulcer.  He denies any fever or chills.  He denies any claudication or rest pain. The patient is diabetic.  Past Medical History:  Diagnosis Date  . 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 Anxiety   . Chronic kidney disease    "jsut a little damage"  . Glaucoma   . History of stomach ulcers    not confirmed, never underwent diagnostic endo or radiology, this manifested as epigastric distress.   .Marland KitchenHx of adenomatous colonic polyps   . Hypercholesterolemia   . Hypertension   . Peripheral neuropathy   . Polycythemia vera ([facility] 2006  . Renal insufficiency 07/16/2017  . Stroke ([facility]    slurred speech  . Substance abuse ([facility]   . Type II diabetes mellitus (HCC)     Family History  Problem Relation Age of Onset  . Diabetes Mother   . Hypertension Mother     SOCIAL HISTORY: Social History   Tobacco Use  . Smoking status: Former Smoker    Packs/day: 0.25    Years: 32.00    Pack years: 8.00    Types: Cigarettes  . Smokeless tobacco: Never Used   Substance Use Topics  . Alcohol use: Yes    Comment: 3/14 quit drinking 5 days ago and then drank last nite    Allergies  Allergen Reactions  . Lipitor [Atorvastatin] Other (See Comments)    Sister states had to have Cardiac Surgery    Current Outpatient Medications  Medication Sig Dispense Refill  . acetaminophen (TYLENOL) 325 MG tablet Take 650 mg by mouth every 6 (six) hours as needed for mild pain.    .Marland KitchenamLODipine (NORVASC) 5 MG tablet Take 1 tablet (5 mg total) by mouth daily. 90 tablet 2  . amoxicillin (AMOXIL) 500 MG capsule TAKE ONE CAPSULE BY MOUTH 4 TIMES A DAY FOR 3 DAYS THEN DECREASE TO 3 TIMES FOR 7 DAYS    . aspirin 81 MG chewable tablet Chew 81 mg by mouth daily.    .Marland Kitchenatropine 1 % ophthalmic solution Place 1 drop into the right eye 2 (two) times a day.    . Blood Glucose Monitoring Suppl (TRUE METRIX GO GLUCOSE METER) w/Device KIT USE AS DIRECTED  0  . clopidogrel (PLAVIX) 75 MG tablet Take 1 tablet (75 mg total) by mouth daily. 90 tablet 2  . glucose blood (TRUE METRIX BLOOD GLUCOSE TEST) test strip 1 each  by Other route 3 (three) times daily. 100 each 12  . HYDROcodone-acetaminophen (NORCO/VICODIN) 5-325 MG tablet TAKE 1 TABLET BY MOUTH EVERY 6 HOURS AS NEEDED FOR TOOTH PAIN    . insulin aspart (NOVOLOG) 100 UNIT/ML injection Inject 2-8 Units into the skin daily as needed for high blood sugar.    . Insulin Glargine (BASAGLAR KWIKPEN) 100 UNIT/ML SOPN Inject 15 Units into the skin daily.    [name] lisinopril-hydrochlorothiazide (ZESTORETIC) 20-25 MG tablet Take 1 tablet by mouth daily.    . Melatonin 5 MG TABS Take 10 mg by mouth at bedtime.    . Multiple Vitamins-Minerals (CEROVITE PO) Take 1 tablet by mouth daily.    . pantoprazole (PROTONIX) 40 MG tablet TAKE 1 TABLET(40 MG) BY MOUTH DAILY AT 6 AM (Patient taking differently: Take 40 mg by mouth daily. ) 90 tablet 2  . prednisoLONE acetate (PRED FORTE) 1 % ophthalmic suspension Place 1 drop into the right eye 4 (four) times  daily.    . rosuvastatin (CRESTOR) 40 MG tablet TAKE 1 TABLET BY MOUTH DAILY AT 6 PM (Patient taking differently: Take 40 mg by mouth daily. ) 90 tablet 2  . sennosides-docusate sodium (SENOKOT-S) 8.6-50 MG tablet Take 1 tablet by mouth daily.    . traZODone (DESYREL) 50 MG tablet Take 2 tablets (100 mg total) by mouth at bedtime. 60 tablet 0  . TRUEPLUS LANCETS 28G MISC 1 each by Does not apply route 3 (three) times daily. 100 each 11  . gabapentin (NEURONTIN) 300 MG capsule Take 1 capsule (300 mg total) by mouth 3 (three) times daily. (Patient taking differently: Take 300 mg by mouth 2 (two) times a day. ) 90 capsule 3  . glipiZIDE (GLUCOTROL) 10 MG tablet Take 1 tablet (10 mg total) by mouth 2 (two) times daily before a meal. 180 tablet 2   No current facility-administered medications for this visit.     REVIEW OF SYSTEMS:  _0  denotes positive finding, _1  denotes negative finding Cardiac  Comments:  Chest pain or chest pressure:    Shortness of breath upon exertion:    Short of breath when lying flat:    Irregular heart rhythm:        Vascular    Pain in calf, thigh, or hip brought on by ambulation:    Pain in feet at night that wakes you up from your sleep:     Blood clot in your veins:    Leg swelling:         Pulmonary    Oxygen at home:    Productive cough:     Wheezing:         Neurologic    Sudden weakness in arms or legs:     Sudden numbness in arms or legs:     Sudden onset of difficulty speaking or slurred speech:    Temporary loss of vision in one eye:     Problems with dizziness:         Gastrointestinal    Blood in stool:     Vomited blood:         Genitourinary    Burning when urinating:     Blood in urine:        Psychiatric    Major depression:         Hematologic    Bleeding problems:    Problems with blood clotting too easily:        Skin    Rashes or ulcers:  Constitutional    Fever or chills:     PHYSICAL EXAM:   Vitals:    06/23/18 1039  BP: 119/75  Pulse: 79  Resp: 20  Temp: 97.6 F (36.4 C)  SpO2: 98%  Weight: 189 lb (85.7 kg)  Height: 5' 7.5" (1.715 m)    GENERAL: The patient is a well-nourished male, in no acute distress. The vital signs are documented above. CARDIAC: There is a regular rate and rhythm.  VASCULAR: I do not detect carotid bruits. He has palpable femoral pulses. I cannot palpate pedal pulses however he has brisk Doppler signals in both feet. PULMONARY: There is good air exchange bilaterally without wheezing or rales. SKIN: He has a small wound on the right second toe which is dry. He has a dry wound on the left medial metatarsal head.      PSYCHIATRIC: The patient has a normal affect.  DATA:    No new data.  MEDICAL ISSUES:   PERIPHERAL VASCULAR DISEASE WITH ULCERS: Based on his previous noninvasive studies he had toe pressures of 64 on the right and 59 on the left.  This suggest adequate circulation for healing and his wounds have in fact improved.  This reason we will hold off on arteriography and only consider this if he develops recurrent ulcerations.  He does have stage III chronic kidney disease and is arteriogram will have to be done with CO2 and limited contrast.  I have encouraged him to stay as active as possible.  Fortunately he quit smoking a long time ago.  I have ordered follow-up ABIs in 6 months and will see him back at that time.  He knows to call sooner if he has problems.  Deitra Mayo [facility] [phone]

## 2018-07-07 ENCOUNTER — Ambulatory Visit: Payer: Medicare (Managed Care) | Admitting: Vascular Surgery

## 2018-08-11 ENCOUNTER — Ambulatory Visit
Admission: RE | Admit: 2018-08-11 | Discharge: 2018-08-11 | Disposition: A | Discharge status: Home / Self Care | Payer: Medicare (Managed Care) | Source: Ambulatory Visit | Attending: Internal Medicine | Admitting: Internal Medicine

## 2018-08-11 ENCOUNTER — Other Ambulatory Visit: Payer: Self-pay | Admitting: Internal Medicine

## 2018-08-11 DIAGNOSIS — M869 Osteomyelitis, unspecified: Secondary | ICD-10-CM

## 2018-09-21 ENCOUNTER — Other Ambulatory Visit: Payer: Self-pay

## 2018-09-21 ENCOUNTER — Ambulatory Visit
Admission: RE | Admit: 2018-09-21 | Discharge: 2018-09-21 | Disposition: A | Discharge status: Home / Self Care | Payer: Medicare (Managed Care) | Source: Ambulatory Visit | Attending: Internal Medicine | Admitting: Internal Medicine

## 2018-09-21 ENCOUNTER — Other Ambulatory Visit: Payer: Self-pay | Admitting: Internal Medicine

## 2018-09-21 DIAGNOSIS — M25511 Pain in right shoulder: Secondary | ICD-10-CM

## 2018-09-21 DIAGNOSIS — M7989 Other specified soft tissue disorders: Secondary | ICD-10-CM

## 2018-09-21 DIAGNOSIS — M25512 Pain in left shoulder: Secondary | ICD-10-CM

## 2018-10-05 ENCOUNTER — Other Ambulatory Visit (HOSPITAL_COMMUNITY): Payer: Self-pay | Admitting: Internal Medicine

## 2018-10-05 ENCOUNTER — Other Ambulatory Visit: Payer: Self-pay | Admitting: Internal Medicine

## 2018-10-05 DIAGNOSIS — G4452 New daily persistent headache (NDPH): Secondary | ICD-10-CM

## 2018-10-18 ENCOUNTER — Ambulatory Visit (HOSPITAL_COMMUNITY)
Admission: RE | Admit: 2018-10-18 | Discharge: 2018-10-18 | Disposition: A | Discharge status: Home / Self Care | Payer: Medicare (Managed Care) | Source: Ambulatory Visit | Attending: Internal Medicine | Admitting: Internal Medicine

## 2018-10-18 ENCOUNTER — Other Ambulatory Visit: Payer: Self-pay

## 2018-10-18 DIAGNOSIS — G4452 New daily persistent headache (NDPH): Secondary | ICD-10-CM | POA: Diagnosis present

## 2018-12-17 ENCOUNTER — Other Ambulatory Visit: Payer: Self-pay

## 2018-12-17 DIAGNOSIS — I739 Peripheral vascular disease, unspecified: Secondary | ICD-10-CM

## 2018-12-22 ENCOUNTER — Encounter (HOSPITAL_COMMUNITY): Payer: Medicare (Managed Care)

## 2018-12-22 ENCOUNTER — Ambulatory Visit: Payer: Medicare (Managed Care) | Admitting: Vascular Surgery

## 2019-01-11 IMAGING — CT CT ANGIO HEAD
2 of 7 series · 8 of 33 positions shown · IV contrast (isovue)
Comparison: CT head without contrast and CT perfusion from today
reported separately.

CLINICAL DATA: 51-year-old male code stroke. Altered mental status
with waxing and waning abnormal speech and extremity weakness in the
ED. Last known normal last night. Clinically the patient symptoms
implicate left cerebral ischemia.

EXAM:
CT ANGIOGRAPHY HEAD AND NECK
TECHNIQUE: Multidetector CT imaging of the head and neck was performed using
the standard protocol during bolus administration of intravenous
contrast. Multiplanar CT image reconstructions and MIPs were
obtained to evaluate the vascular anatomy. Carotid stenosis
measurements (when applicable) are obtained utilizing NASCET
criteria, using the distal internal carotid diameter as the
denominator.
CONTRAST:  100 mL Isovue 370, in addition to the contrast volume
reported on the CT perfusion study today

[Series 6: cta neck · axial · 0.81mm/px · z∈[-272,-140]mm · 2 of 193 slices shown]
[im 65/193  soft-tissue]
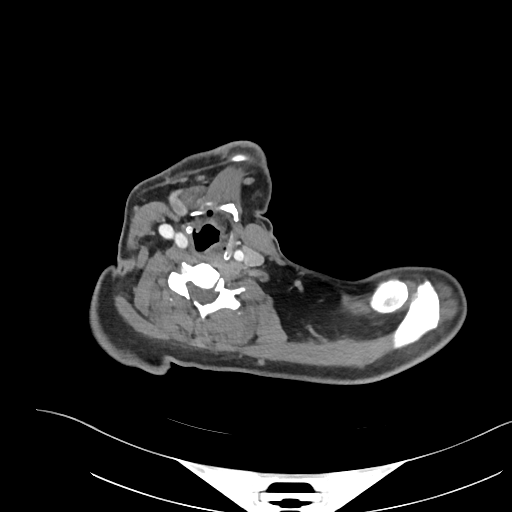
[im 129/193  bone]
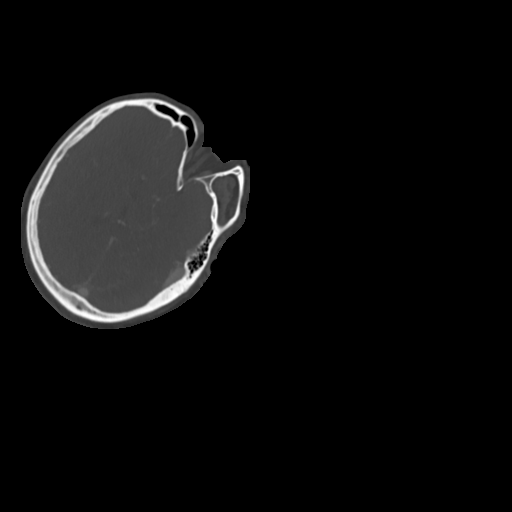

[Series 8: cta neck axial · axial · 0.57mm/px · z∈[-351,-104]mm · 6 of 347 slices shown]
[im 50/347  soft-tissue]
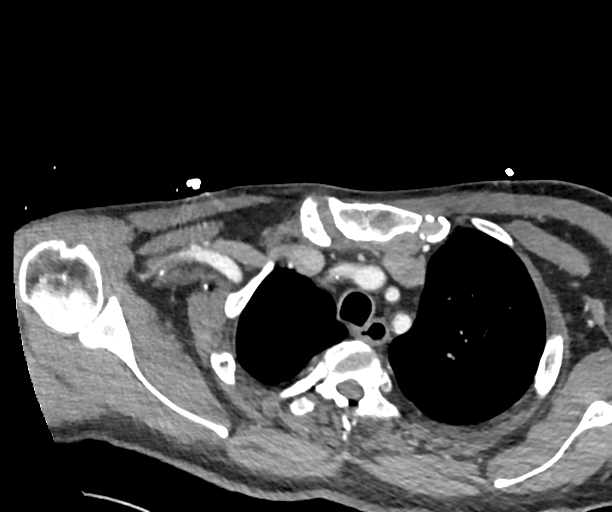
[im 99/347  soft-tissue]
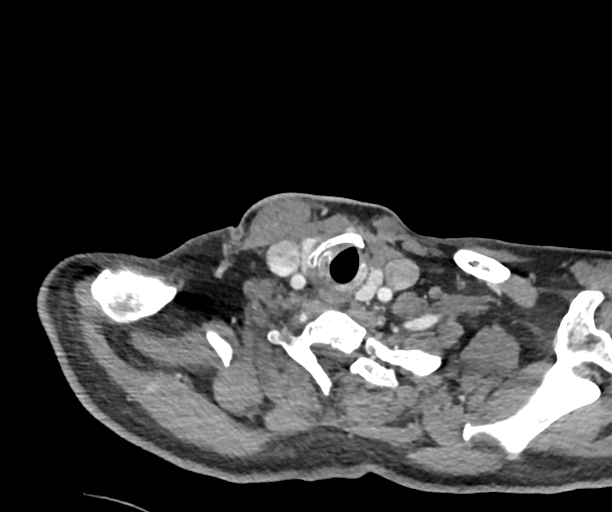
[im 149/347  soft-tissue]
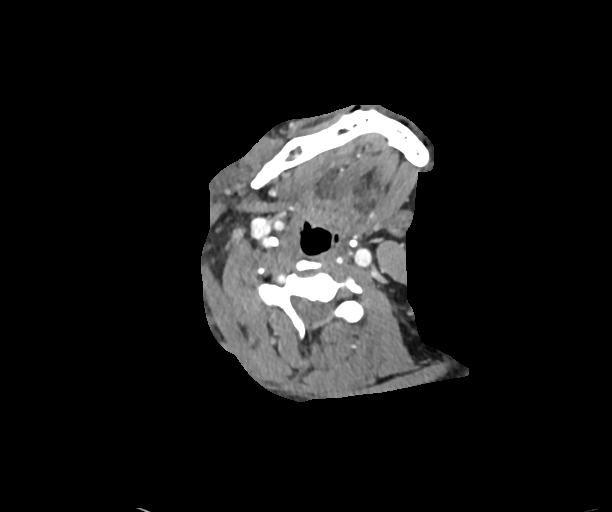
[im 198/347  soft-tissue]
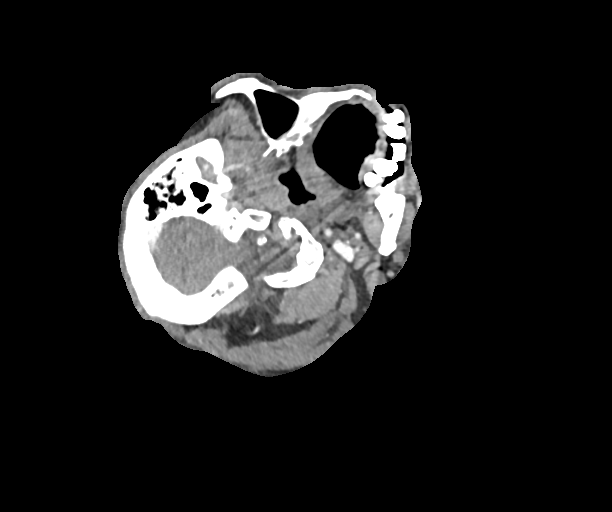
[im 248/347  soft-tissue]
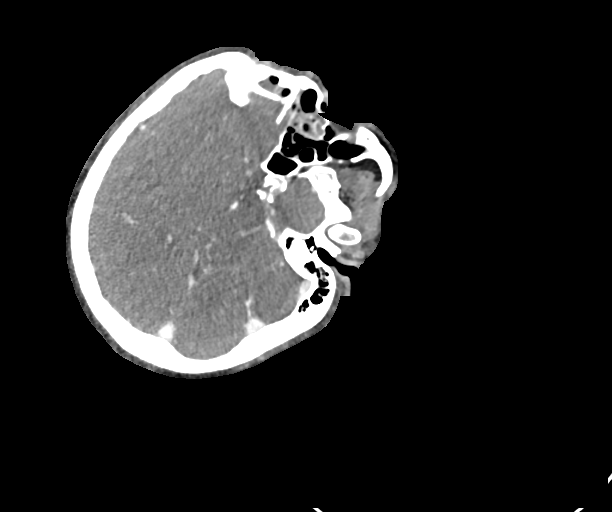
[im 297/347  soft-tissue]
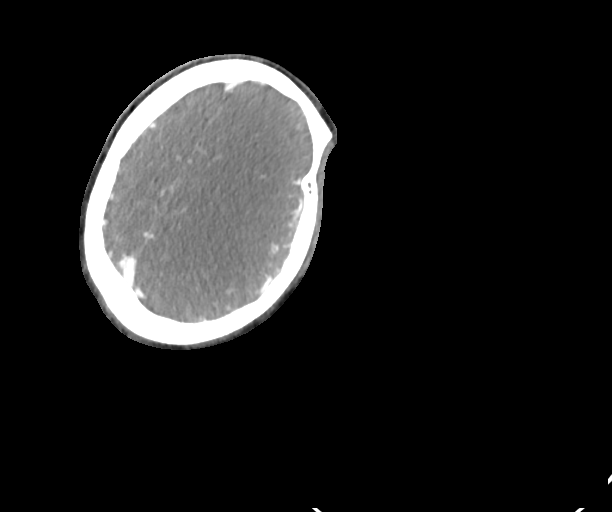

[8 of 33 positions shown; findings below may reference images not displayed]

FINDINGS: The initial CTA attempt was noncontrast and therefore nondiagnostic
related to IV contrast extravasation. See details on the CT
perfusion study today.

Repeat CTA was performed at 2221 hours.

CTA NECK

Skeleton: Intermittent poor dentition. No acute osseous abnormality
identified. Chronic right lamina papyracea fracture.

Upper chest: Negative visualized lung parenchyma. No superior
mediastinal mass or lymphadenopathy.

Other neck: Negative.  No cervical lymphadenopathy.

Could contrast timing on the repeat CTA images.

Aortic arch: 3 vessel arch configuration with minimal arch
atherosclerosis.

Right carotid system: Negative right CCA. Soft and calcified plaque
at the right ICA origin and bulb but no associated stenosis.

Left carotid system: Mild soft and calcified plaque at the left CCA
origin without stenosis. Calcified plaque at the left ICA origin
without stenosis.

Vertebral arteries:

No proximal right subclavian artery stenosis, minimal plaque. Normal
right vertebral artery origin. The right vertebral is patent to the
skullbase without stenosis.

No proximal left subclavian artery stenosis, minimal soft plaque.
The left vertebral artery is occluded at its origin and throughout
the neck.

CTA HEAD

Posterior circulation: The distal right vertebral artery is patent
but demonstrates moderate to severe atherosclerosis, and up to
moderate stenosis in the V4 segment. The vertebrobasilar junction is
patent. New line the distal left vertebral artery appears occluded
to the level of L bulky calcified plaque seen on series 8, image
149. The distal left V4 segment appears remain patent including the
left PICA origin, and is supplied in a retrograde fashion from the
vertebrobasilar junction.

The basilar artery is patent but highly irregular with moderate to
severe multifocal distal basilar stenosis. Both PCA P1 segments are
patent, although irregular and worse on the right side. Distal right
PCA flow is more robust on the left. It is unclear whether the
posterior communicating arteries are present.

Anterior circulation: Both ICA siphons are patent but heavily
calcified. Hemodynamically significant bilateral anterior genu and
proximal supraclinoid segment stenosis is suspected. Both carotid
termini are patent. The bilateral ACA A1 segments appear patent but
diminutive. The anterior communicating artery and bilateral a 2
segments are patent but irregular. The left MCA M1 segment,
bifurcation, and proximal left MCA branches are patent but
irregular. There is more severe irregularity of the right MCA M1
segment and right MCA M2 branches, which demonstrate attenuated flow
when compared to the left side.

Venous sinuses: Patent.

Anatomic variants: None.

Review of the MIP images confirms the above findings
IMPRESSION: 1. Negative for emergent large vessel occlusion.
2. Chronic occlusion of the left vertebral artery from its origin to
the V4 segment. This appears stable since the 3294 brain MRI, and
there is retrograde reconstitution of the distal left V4 segment and
left PICA.
3. No significant carotid or right vertebral artery stenosis in the
neck despite atherosclerosis.
4. Although there is no intracranial large vessel occlusion, there
is widespread severe intracranial vascular disease and
atherosclerosis. There are moderate or severe stenoses of the
bilateral ICA siphons, the right vertebral artery V4 segment which
supplies the basilar, the basilar artery, and the right MCA M1 and
right PCA P1 segments. These findings have progressed since the 3294
MRA.
5. Attenuated flow in the right MCA and right PCA branches, but
better maintained flow in the bilateral ACA, left MCA and left PCA
branches.
6. This study was reviewed in person with Dr. Anadol on 10/23/2016 at
6646 hours.

## 2019-01-12 IMAGING — RF DG SWALLOWING FUNCTION - NRPT [facility]
1 series · 18 of 24 positions shown · non-contrast
Comparison: none

[Series 1: run · 22 acquisitions, 18 frames shown]
[im 1/22]
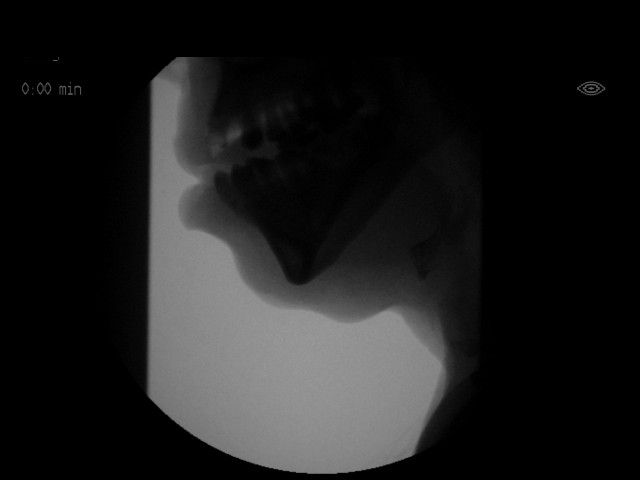
[im 3/22]
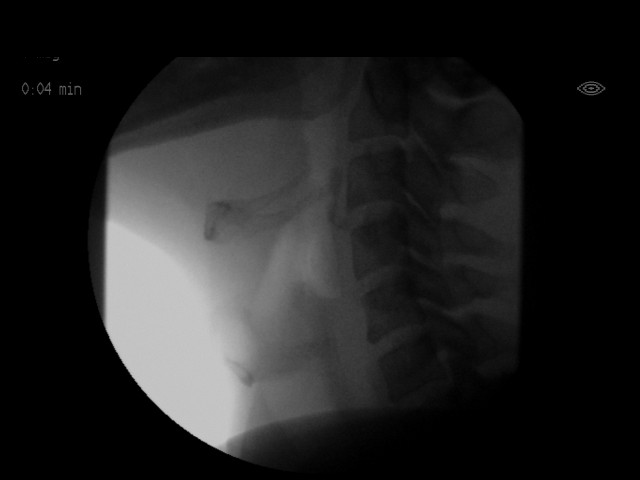
[im 3/22]
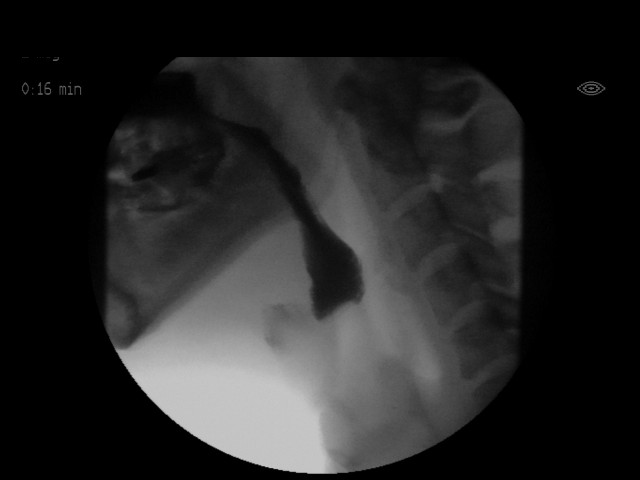
[im 4/22]
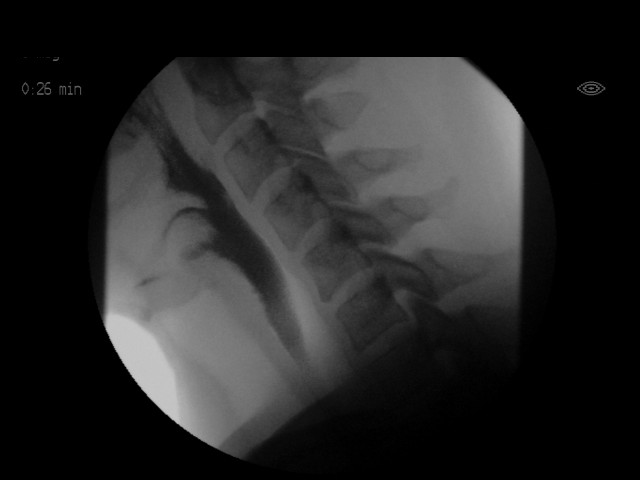
[im 6/22]
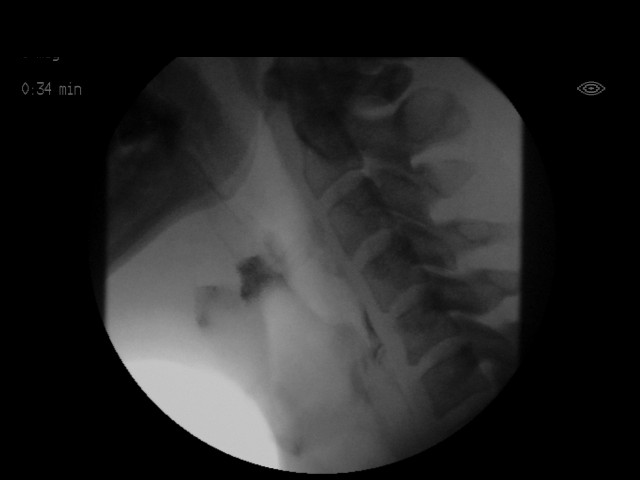
[im 7/22]
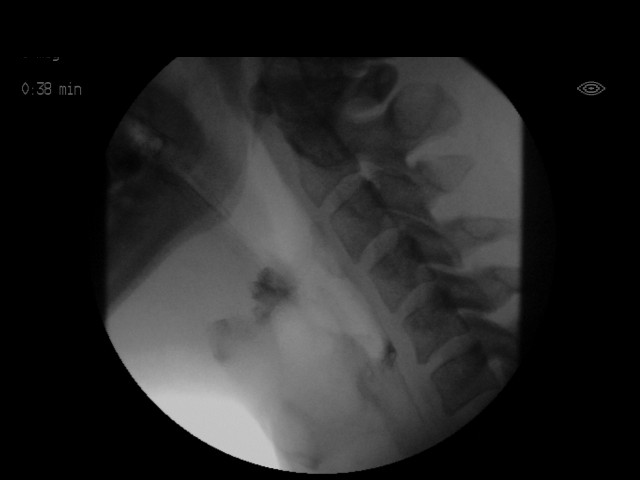
[im 8/22]
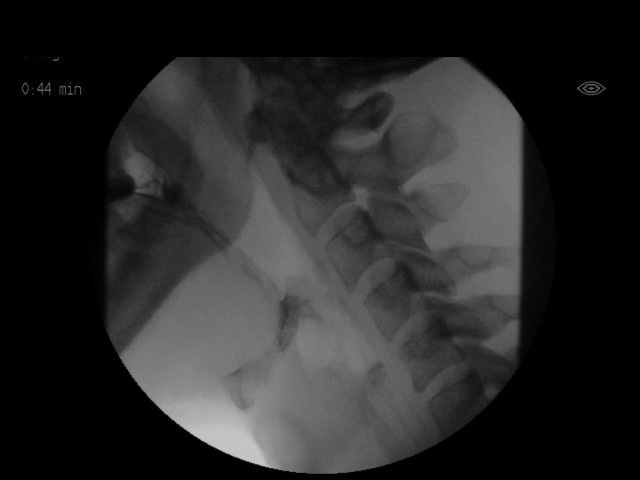
[im 10/22]
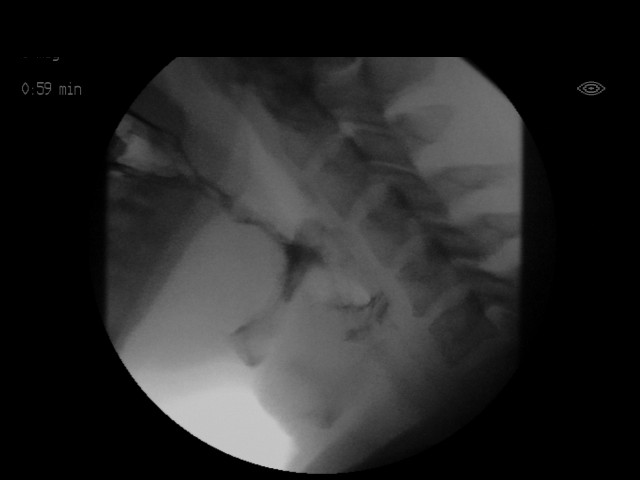
[im 11/22]
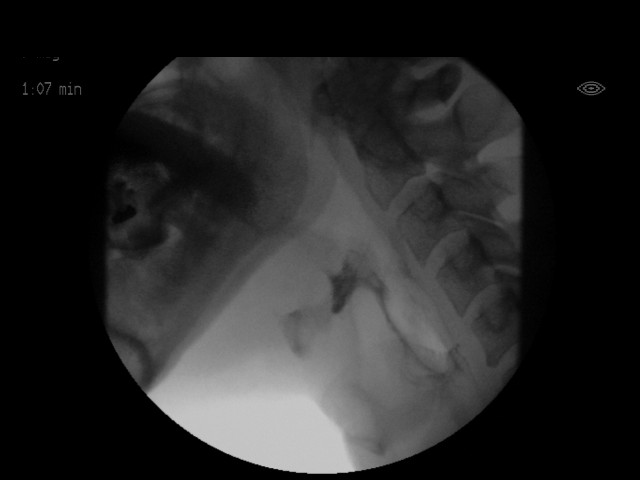
[im 12/22]
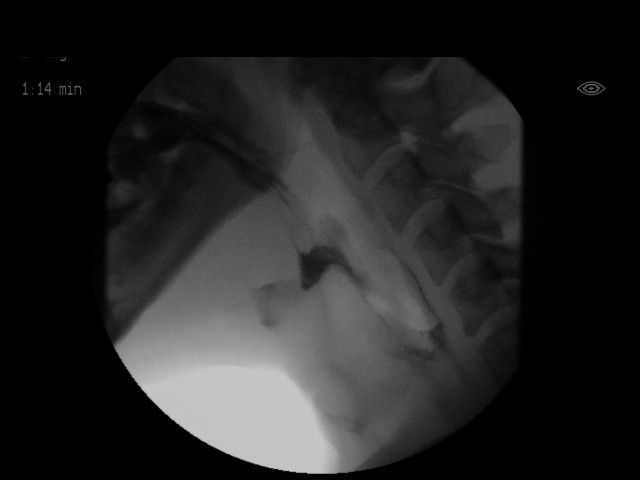
[im 14/22]
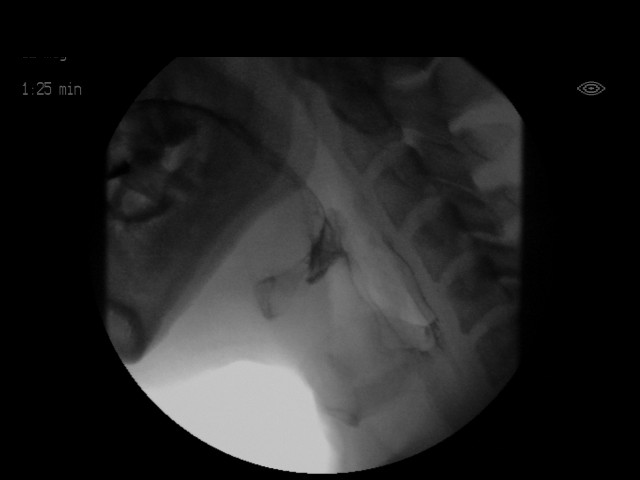
[im 15/22]
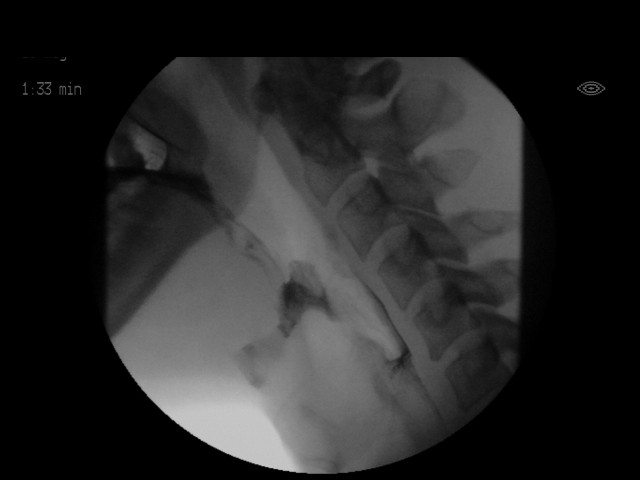
[im 16/22]
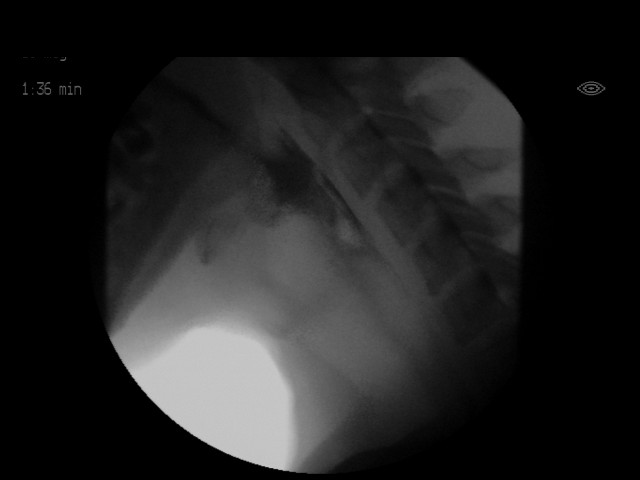
[im 18/22]
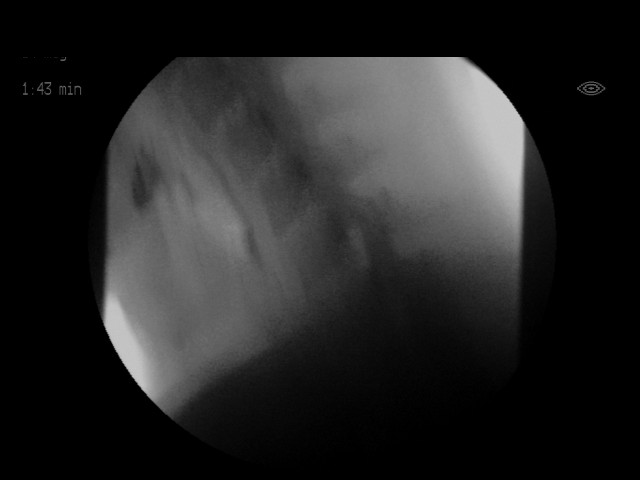
[im 19/22]
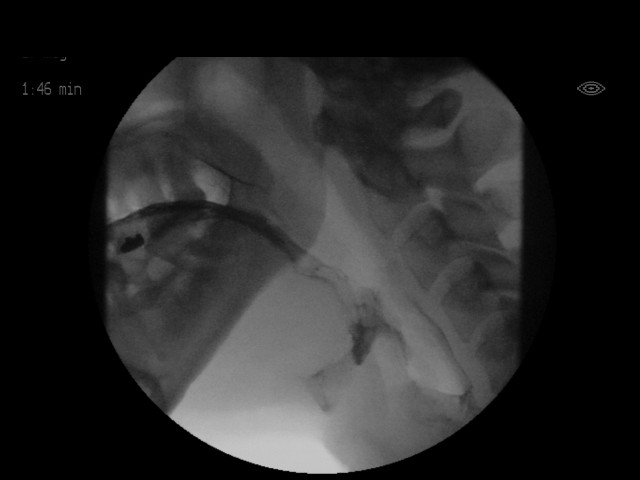
[im 20/22]
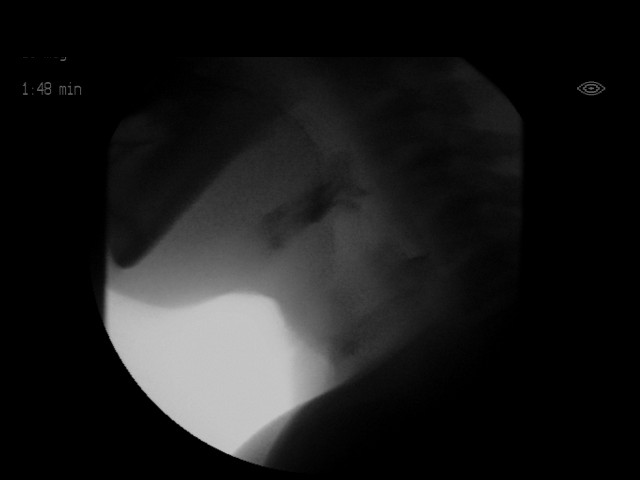
[im 21/22]
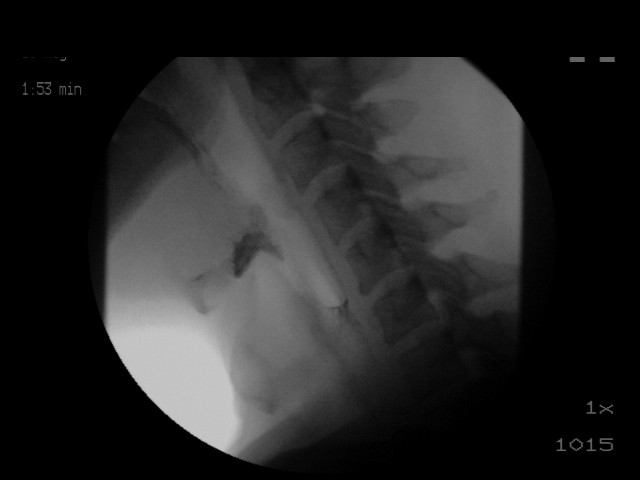
[im 22/22]
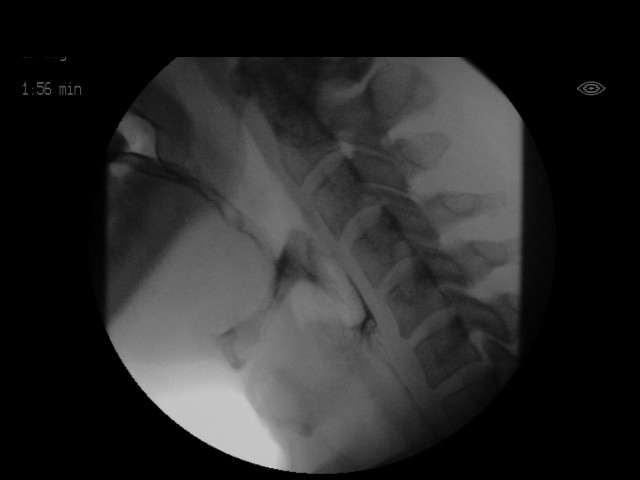

[18 of 24 positions shown; findings below may reference images not displayed]

FLUOROSCOPY FOR SWALLOWING FUNCTION STUDY:
Fluoroscopy was provided for swallowing function study, which was administered by a speech pathologist.  Final results and recommendations from this study are contained within the speech pathology report.

## 2019-02-09 ENCOUNTER — Ambulatory Visit (INDEPENDENT_AMBULATORY_CARE_PROVIDER_SITE_OTHER): Payer: Medicare (Managed Care) | Admitting: Vascular Surgery

## 2019-02-09 ENCOUNTER — Encounter: Payer: Self-pay | Admitting: Vascular Surgery

## 2019-02-09 ENCOUNTER — Other Ambulatory Visit: Payer: Self-pay

## 2019-02-09 ENCOUNTER — Ambulatory Visit (HOSPITAL_COMMUNITY)
Admission: RE | Admit: 2019-02-09 | Discharge: 2019-02-09 | Disposition: A | Discharge status: Home / Self Care | Payer: Medicare (Managed Care) | Source: Ambulatory Visit | Attending: Vascular Surgery | Admitting: Vascular Surgery

## 2019-02-09 VITALS — BP 108/71 | HR 84 | Temp 98.0°F | Resp 20 | Ht 67.5 in | Wt 189.0 lb

## 2019-02-09 DIAGNOSIS — I739 Peripheral vascular disease, unspecified: Secondary | ICD-10-CM | POA: Diagnosis present

## 2019-02-09 NOTE — Progress Notes (Signed)
Patient name: Eric Morton MRN: [id number] DOB: Jun 04, 1965 Sex: male  REASON FOR VISIT:   Follow-up of peripheral vascular disease.  HPI:   Eric Morton is a pleasant 54 y.o. male who I last saw on 06/23/2018.Eric Morton  He had peripheral vascular disease with bilateral lower extremity ulcers.  He was not a smoker.  The wounds were reportedly improving and we decided to hold off on arteriography unless the wounds worsen.  He does have stage III chronic kidney disease.  When I saw him last his toe pressures were reasonable and his wounds appear to be improving.  I therefore set him up for a 13-monthfollow-up visit.  Since I saw him last he denies any history of claudication.  However, his activity is very limited.  He is ambulatory some with a walker.  He denies any history of rest pain.  All his wounds on both feet healed.  He is not a smoker.  Past Medical History:  Diagnosis Date  . 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 Anxiety   . Chronic kidney disease    "jsut a little damage"  . Glaucoma   . History of stomach ulcers    not confirmed, never underwent diagnostic endo or radiology, this manifested as epigastric distress.   .Eric KitchenHx of adenomatous colonic polyps   . Hypercholesterolemia   . Hypertension   . Peripheral neuropathy   . [place]cythemia vera ([place] 2006  . Renal insufficiency 07/16/2017  . St[facility] ([facility]    slurred speech  . Substance abuse ([facility]   . Type II diabetes mellitus (HCC)     Family History  Problem Relation Age of Onset  . Diabetes Mother   . Hypertension Mother     SOCIAL HISTORY: Social History   Tobacco Use  . Smoking status: Former Smoker    Packs/day: 0.25    Years: 32.00    Pack years: 8.00    Types: Cigarettes  . Smokeless tobacco: Never Used  Substance Use Topics  . Alcohol use: Yes    Comment: 3/14 quit drinking 5 days ago and then drank last nite    Allergies  Allergen Reactions  . Lipitor [Atorvastatin] Other (See  Comments)    Sister states had to have Cardiac Surgery    Current Outpatient Medications  Medication Sig Dispense Refill  . acetaminophen (TYLENOL) 325 MG tablet Take 650 mg by mouth every 6 (six) hours as needed for mild pain.    .Eric KitchenamLODipine (NORVASC) 5 MG tablet Take 1 tablet (5 mg total) by mouth daily. 90 tablet 2  . amoxicillin (AMOXIL) 500 MG capsule TAKE ONE CAPSULE BY MOUTH 4 TIMES A DAY FOR 3 DAYS THEN DECREASE TO 3 TIMES FOR 7 DAYS    . aspirin 81 MG chewable tablet Chew 81 mg by mouth daily.    .Eric Kitchenatropine 1 % ophthalmic solution Place 1 drop into the right eye 2 (two) times a day.    . Blood Glucose Monitoring Suppl (TRUE METRIX GO GLUCOSE METER) w/Device KIT USE AS DIRECTED  0  . clopidogrel (PLAVIX) 75 MG tablet Take 1 tablet (75 mg total) by mouth daily. 90 tablet 2  . glucose blood (TRUE METRIX BLOOD GLUCOSE TEST) test strip 1 each by Other route 3 (three) times daily. 100 each 12  . HYDROcodone-acetaminophen (NORCO/VICODIN) 5-325 MG tablet TAKE 1 TABLET BY MOUTH EVERY 6 HOURS AS NEEDED FOR TOOTH PAIN    . insulin aspart (  NOVOLOG) 100 UNIT/ML injection Inject 2-8 Units into the skin daily as needed for high blood sugar.    . Insulin Glargine (BASAGLAR KWIKPEN) 100 UNIT/ML SOPN Inject 15 Units into the skin daily.    Eric Morton lisinopril-hydrochlorothiazide (ZESTORETIC) 20-25 MG tablet Take 1 tablet by mouth daily.    . Melatonin 5 MG TABS Take 10 mg by mouth at bedtime.    . Multiple Vitamins-Minerals (CEROVITE PO) Take 1 tablet by mouth daily.    . pantoprazole (PROTONIX) 40 MG tablet TAKE 1 TABLET(40 MG) BY MOUTH DAILY AT 6 AM (Patient taking differently: Take 40 mg by mouth daily. ) 90 tablet 2  . prednisoLONE acetate (PRED FORTE) 1 % ophthalmic suspension Place 1 drop into the right eye 4 (four) times daily.    . rosuvastatin (CRESTOR) 40 MG tablet TAKE 1 TABLET BY MOUTH DAILY AT 6 PM (Patient taking differently: Take 40 mg by mouth daily. ) 90 tablet 2  . sennosides-docusate  sodium (SENOKOT-S) 8.6-50 MG tablet Take 1 tablet by mouth daily.    . traZODone (DESYREL) 50 MG tablet Take 2 tablets (100 mg total) by mouth at bedtime. 60 tablet 0  . TRUEPLUS LANCETS 28G MISC 1 each by Does not apply route 3 (three) times daily. 100 each 11  . gabapentin (NEURONTIN) 300 MG capsule Take 1 capsule (300 mg total) by mouth 3 (three) times daily. (Patient taking differently: Take 300 mg by mouth 2 (two) times a day. ) 90 capsule 3  . glipiZIDE (GLUCOTROL) 10 MG tablet Take 1 tablet (10 mg total) by mouth 2 (two) times daily before a meal. 180 tablet 2   No current facility-administered medications for this visit.    REVIEW OF SYSTEMS:  _0  denotes positive finding, _1  denotes negative finding Cardiac  Comments:  Chest pain or chest pressure:    Shortness of breath upon exertion:    Short of breath when lying flat:    Irregular heart rhythm:        Vascular    Pain in calf, thigh, or hip brought on by ambulation:    Pain in feet at night that wakes you up from your sleep:     Blood clot in your veins:    Leg swelling:  x resolved      Pulmonary    Oxygen at home:    Productive cough:     Wheezing:         Neurologic    Sudden weakness in arms or legs:     Sudden numbness in arms or legs:     Sudden onset of difficulty speaking or slurred speech:    Temporary loss of vision in one eye:     Problems with dizziness:         Gastrointestinal    Blood in stool:     Vomited blood:         Genitourinary    Burning when urinating:     Blood in urine:        Psychiatric    Major depression:         Hematologic    Bleeding problems:    Problems with blood clotting too easily:        Skin    Rashes or ulcers:        Constitutional    Fever or chills:     PHYSICAL EXAM:   Vitals:   02/09/19 1328  BP: 108/71  Pulse: 84  Resp: 20  Temp: 98 F (36.7 C)  SpO2: 100%  Weight: 189 lb (85.7 kg)  Height: 5' 7.5" (1.715 m)    GENERAL: The patient is a  well-nourished male, in no acute distress. The vital signs are documented above. CARDIAC: There is a regular rate and rhythm.  VASCULAR: I do not detect carotid bruits. He has palpable femoral pulses. I cannot palpate pedal pulses however both feet are warm and well perfused. He has no significant lower extremity swelling. PULMONARY: There is good air exchange bilaterally without wheezing or rales. ABDOMEN: Soft and non-tender with normal pitched bowel sounds.  MUSCULOSKELETAL: There are no major deformities or cyanosis. NEUROLOGIC: No focal weakness or paresthesias are detected. SKIN: He has no open ulcers currently.  He does have some calluses over the medial aspect of the first metatarsal on both feet.    PSYCHIATRIC: The patient has a normal affect.  DATA:    ARTERIAL DOPPLER STUDY: I have independently interpreted his arterial Doppler study today.  On the right side there is a biphasic posterior tibial signal with a monophasic dorsalis pedis signal ABI is 100% although this may be falsely elevated.  Toe pressure is 69 mmHg.  On the left side there is a triphasic dorsalis pedis and posterior tibial signal.  ABI is 90%.  Toe pressure is 78 mmHg.  MEDICAL ISSUES:   PERIPHERAL VASCULAR DISEASE: The patient has evidence of infrainguinal arterial occlusive disease bilaterally.  His disease is more significant on the right side however he has a toe pressure of 69 mmHg and the wounds on both feet have healed.  He is not a smoker.  I have encouraged him to stay as active as possible.  I think we do need to follow his peripheral vascular disease closely and I have ordered follow-up ABIs in 1 year.  I will see him back at that time.  He knows to call sooner if he has problems.  Deitra Mayo [facility] [phone]

## 2019-02-10 ENCOUNTER — Other Ambulatory Visit: Payer: Self-pay | Admitting: *Deleted

## 2019-02-10 DIAGNOSIS — I739 Peripheral vascular disease, unspecified: Secondary | ICD-10-CM

## 2019-10-07 ENCOUNTER — Encounter (HOSPITAL_BASED_OUTPATIENT_CLINIC_OR_DEPARTMENT_OTHER): Payer: Medicaid Other | Attending: Internal Medicine | Admitting: Internal Medicine

## 2019-10-07 DIAGNOSIS — L97522 Non-pressure chronic ulcer of other part of left foot with fat layer exposed: Secondary | ICD-10-CM | POA: Insufficient documentation

## 2019-10-07 DIAGNOSIS — Z87891 Personal history of nicotine dependence: Secondary | ICD-10-CM | POA: Insufficient documentation

## 2019-10-07 DIAGNOSIS — E11621 Type 2 diabetes mellitus with foot ulcer: Secondary | ICD-10-CM | POA: Diagnosis not present

## 2019-10-07 DIAGNOSIS — E1151 Type 2 diabetes mellitus with diabetic peripheral angiopathy without gangrene: Secondary | ICD-10-CM | POA: Diagnosis not present

## 2019-10-07 DIAGNOSIS — I129 Hypertensive chronic kidney disease with stage 1 through stage 4 chronic kidney disease, or unspecified chronic kidney disease: Secondary | ICD-10-CM | POA: Diagnosis not present

## 2019-10-07 DIAGNOSIS — L97518 Non-pressure chronic ulcer of other part of right foot with other specified severity: Secondary | ICD-10-CM | POA: Insufficient documentation

## 2019-10-07 DIAGNOSIS — E1122 Type 2 diabetes mellitus with diabetic chronic kidney disease: Secondary | ICD-10-CM | POA: Diagnosis not present

## 2019-10-07 DIAGNOSIS — E1140 Type 2 diabetes mellitus with diabetic neuropathy, unspecified: Secondary | ICD-10-CM | POA: Insufficient documentation

## 2019-10-07 DIAGNOSIS — N183 Chronic kidney disease, stage 3 unspecified: Secondary | ICD-10-CM | POA: Insufficient documentation

## 2019-10-07 DIAGNOSIS — Z8673 Personal history of transient ischemic attack (TIA), and cerebral infarction without residual deficits: Secondary | ICD-10-CM | POA: Insufficient documentation

## 2019-10-07 NOTE — Progress Notes (Signed)
Eric Morton, Eric Morton ([id number]) Visit Report for 10/07/2019 Abuse/Suicide Risk Screen Details Patient Name: Date of Service: Eric Morton 10/07/2019 1:15 PM Medical Record Number: [id number] Patient Account Number: [id number] Date of Birth/Sex: Treating RN: 1965/12/29 (54 y.o. Jerilynn Mages) Carlene Coria Primary Care Jerlisa Diliberto: Geryl Rankins Other Clinician: Referring Nirali Magouirk: Treating Nocholas Damaso/Extender: Tomasa Blase, Paulette Blanch in Treatment: 0 Abuse/Suicide Risk Screen Items Answer ABUSE RISK SCREEN: Has anyone close to you tried to hurt or harm you recentlyo No Do you feel uncomfortable with anyone in your familyo No Has anyone forced you do things that you didnt want to doo No Electronic Signature(s) Signed: 10/07/2019 4:58:04 PM By: Carlene Coria RN Entered By: Carlene Coria on 10/07/2019 14:45:21 -------------------------------------------------------------------------------- Activities of Daily Living Details Patient Name: Date of Service: Eric Morton 10/07/2019 1:15 PM Medical Record Number: [id number] Patient Account Number: [id number] Date of Birth/Sex: Treating RN: Mar 05, 1965 (54 y.o. Oval Linsey Primary Care Valarie Farace: Geryl Rankins Other Clinician: Referring Amdrew Oboyle: Treating Ivett Luebbe/Extender: Tomasa Blase, Paulette Blanch in Treatment: 0 Activities of Daily Living Items Answer Activities of Daily Living (Please select one for each item) Drive Automobile Not Able T Medications ake Not Able Use T elephone Need Assistance Care for Appearance Completely Able Use T oilet Completely Able Bath / Shower Need Assistance Dress Self Completely Able Feed Self Completely Able Walk Completely Able Get In / Out Bed Completely Able Housework Not Able Prepare Meals Not Able Handle Money Not Able Shop for Self Not Able Electronic Signature(s) Signed: 10/07/2019 4:58:04 PM By: Carlene Coria RN Entered By: Carlene Coria on 10/07/2019  14:46:47 -------------------------------------------------------------------------------- Education Screening Details Patient Name: Date of Service: Eric Morton. 10/07/2019 1:15 PM Medical Record Number: [id number] Patient Account Number: [id number] Date of Birth/Sex: Treating RN: 26-Jun-1965 (54 y.o. Oval Linsey Primary Care Ariyanah Aguado: Geryl Rankins Other Clinician: Referring Andie Mungin: Treating Terianna Peggs/Extender: Lenetta Quaker in Treatment: 0 Primary Learner Assessed: Patient Learning Preferences/Education Level/Primary Language Learning Preference: Explanation Highest Education Level: High School Preferred Language: English Cognitive Barrier Language Barrier: No Translator Needed: No Memory Deficit: No Emotional Barrier: No Cultural/Religious Beliefs Affecting Medical Care: No Physical Barrier Impaired Vision: No Impaired Hearing: No Decreased Hand dexterity: No Knowledge/Comprehension Knowledge Level: Medium Comprehension Level: Medium Ability to understand written instructions: Medium Ability to understand verbal instructions: Medium Motivation Anxiety Level: Anxious Cooperation: Cooperative Education Importance: Acknowledges Need Interest in Health Problems: Asks Questions Perception: Coherent Willingness to Engage in Self-Management High Activities: Readiness to Engage in Self-Management High Activities: Electronic Signature(s) Signed: 10/07/2019 4:58:04 PM By: Carlene Coria RN Entered By: Carlene Coria on 10/07/2019 14:47:32 -------------------------------------------------------------------------------- Fall Risk Assessment Details Patient Name: Date of Service: Eric Morton, Eric Lederer. 10/07/2019 1:15 PM Medical Record Number: [id number] Patient Account Number: [id number] Date of Birth/Sex: Treating RN: 1965/03/01 (54 y.o. Oval Linsey Primary Care Ranson Belluomini: Geryl Rankins Other Clinician: Referring Samnang Shugars: Treating  Toshiro Hanken/Extender: Tomasa Blase, Paulette Blanch in Treatment: 0 Fall Risk Assessment Items Have you had 2 or more falls in the last 12 monthso 0 No Have you had any fall that resulted in injury in the last 12 monthso 0 No FALLS RISK SCREEN History of falling - immediate or within 3 months 0 No Secondary diagnosis (Do you have 2 or more medical diagnoseso) 0 No Ambulatory aid None/bed rest/wheelchair/nurse 0 No Crutches/cane/walker 0 No Furniture 0 No Intravenous therapy Access/Saline/Heparin Lock 0 No Gait/Transferring Normal/ bed rest/ wheelchair 0 No Weak (short steps with or without  shuffle, stooped but able to lift head while walking, may seek 0 No support from furniture) Impaired (short steps with shuffle, may have difficulty arising from chair, head down, impaired 0 No balance) Mental Status Oriented to own ability 0 No Electronic Signature(s) Signed: 10/07/2019 4:58:04 PM By: Carlene Coria RN Entered By: Carlene Coria on 10/07/2019 14:49:52 -------------------------------------------------------------------------------- Foot Assessment Details Patient Name: Date of Service: Eric Morton. 10/07/2019 1:15 PM Medical Record Number: [id number] Patient Account Number: [id number] Date of Birth/Sex: Treating RN: 1965/12/19 (54 y.o. Jerilynn Mages) Carlene Coria Primary Care Yago Ludvigsen: Geryl Rankins Other Clinician: Referring Shainna Faux: Treating Tauri Ethington/Extender: Tomasa Blase, Paulette Blanch in Treatment: 0 Foot Assessment Items Site Locations + = Sensation present, - = Sensation absent, C = Callus, U = Ulcer R = Redness, W = Warmth, M = Maceration, PU = Pre-ulcerative lesion F = Fissure, S = Swelling, D = Dryness Assessment Right: Left: Other Deformity: No No Prior Foot Ulcer: No No Prior Amputation: No No Charcot Joint: No No Ambulatory Status: Ambulatory Without Help Gait: Steady Electronic Signature(s) Signed: 10/07/2019 4:58:04 PM By: Carlene Coria RN Entered  By: Carlene Coria on 10/07/2019 14:52:55 -------------------------------------------------------------------------------- Nutrition Risk Screening Details Patient Name: Date of Service: Eric Morton 10/07/2019 1:15 PM Medical Record Number: [id number] Patient Account Number: [id number] Date of Birth/Sex: Treating RN: Nov 15, 1965 (54 y.o. Jerilynn Mages) Carlene Coria Primary Care Sinahi Knights: Geryl Rankins Other Clinician: Referring Taunya Goral: Treating Leroi Haque/Extender: Tomasa Blase, Army Melia Weeks in Treatment: 0 Height (in): 68 Weight (lbs): 185 Body Mass Index (BMI): 28.1 Nutrition Risk Screening Items Score Screening NUTRITION RISK SCREEN: I have an illness or condition that made me change the kind and/or amount of food I eat 0 No I eat fewer than two meals per day 0 No I eat few fruits and vegetables, or milk products 0 No I have three or more drinks of beer, liquor or wine almost every day 0 No I have tooth or mouth problems that make it hard for me to eat 0 No I don't always have enough money to buy the food I need 0 No I eat alone most of the time 0 No I take three or more different prescribed or over-the-counter drugs a day 1 Yes Without wanting to, I have lost or gained 10 pounds in the last six months 0 No I am not always physically able to shop, cook and/or feed myself 2 Yes Nutrition Protocols Good Risk Protocol Moderate Risk Protocol 0 Provide education on nutrition High Risk Proctocol Risk Level: Moderate Risk Score: 3 Electronic Signature(s) Signed: 10/07/2019 4:58:04 PM By: Carlene Coria RN Entered By: Carlene Coria on 10/07/2019 14:50:11

## 2019-10-10 NOTE — Progress Notes (Signed)
Eric, Morton ([id number]) Visit Report for 10/07/2019 Chief Complaint Document Details Patient Name: Date of Service: Eric Morton 10/07/2019 1:15 PM Medical Record Number: [id number] Patient Account Number: [id number] Date of Birth/Sex: Treating RN: 1966-01-01 (54 y.o. Eric Morton Primary Care Provider: Geryl Rankins Other Clinician: Referring Provider: Treating Provider/Extender: Tomasa Blase, Paulette Blanch in Treatment: 0 Information Obtained from: Patient Chief Complaint 10/07/2019; patient is here for review of wounds on his bilateral feet Electronic Signature(s) Signed: 10/10/2019 7:26:40 PM By: Linton Ham MD Entered By: Linton Ham on 10/07/2019 15:31:40 -------------------------------------------------------------------------------- Debridement Details Patient Name: Date of Service: Eric Morton. 10/07/2019 1:15 PM Medical Record Number: [id number] Patient Account Number: [id number] Date of Birth/Sex: Treating RN: May 25, 1965 (54 y.o. Eric Morton Primary Care Provider: Geryl Rankins Other Clinician: Referring Provider: Treating Provider/Extender: Lenetta Quaker in Treatment: 0 Debridement Performed for Assessment: Wound #1 Right,Medial Foot Performed By: Physician Ricard Dillon., MD Debridement Type: Debridement Severity of Tissue Pre Debridement: Fat layer exposed Level of Consciousness (Pre-procedure): Awake and Alert Pre-procedure Verification/Time Out Yes - 15:20 Taken: Start Time: 15:20 Pain Control: Other : benzocaine, 20% T Area Debrided (L x W): otal 1 (cm) x 1.7 (cm) = 1.7 (cm) Tissue and other material debrided: Viable, Non-Viable, Eschar, Slough, Subcutaneous, Slough Level: Skin/Subcutaneous Tissue Debridement Description: Excisional Instrument: Curette Bleeding: Minimum Hemostasis Achieved: Pressure End Time: 15:21 Procedural Pain: 0 Post Procedural Pain: 0 Response to Treatment:  Procedure was tolerated well Level of Consciousness (Post- Awake and Alert procedure): Post Debridement Measurements of Total Wound Length: (cm) 1 Width: (cm) 1.7 Depth: (cm) 0.2 Volume: (cm) 0.267 Character of Wound/Ulcer Post Debridement: Requires Further Debridement Severity of Tissue Post Debridement: Fat layer exposed Post Procedure Diagnosis Same as Pre-procedure Electronic Signature(s) Signed: 10/07/2019 4:40:23 PM By: Kela Millin Signed: 10/10/2019 7:26:40 PM By: Linton Ham MD Entered By: Linton Ham on 10/07/2019 15:31:12 -------------------------------------------------------------------------------- Debridement Details Patient Name: Date of Service: Eric Morton. 10/07/2019 1:15 PM Medical Record Number: [id number] Patient Account Number: [id number] Date of Birth/Sex: Treating RN: 14-Jan-1966 (54 y.o. Eric Morton Primary Care Provider: Geryl Rankins Other Clinician: Referring Provider: Treating Provider/Extender: Lenetta Quaker in Treatment: 0 Debridement Performed for Assessment: Wound #2 Left,Medial Foot Performed By: Physician Ricard Dillon., MD Debridement Type: Debridement Severity of Tissue Pre Debridement: Fat layer exposed Level of Consciousness (Pre-procedure): Awake and Alert Pre-procedure Verification/Time Out Yes - 15:20 Taken: Start Time: 15:20 Pain Control: Other : benzocaine, 20% T Area Debrided (L x W): otal 0.6 (cm) x 1.6 (cm) = 0.96 (cm) Tissue and other material debrided: Viable, Non-Viable, Eschar, Slough, Subcutaneous, Slough Level: Skin/Subcutaneous Tissue Debridement Description: Excisional Instrument: Curette Bleeding: Minimum Hemostasis Achieved: Pressure End Time: 15:21 Procedural Pain: 0 Post Procedural Pain: 0 Response to Treatment: Procedure was tolerated well Level of Consciousness (Post- Awake and Alert procedure): Post Debridement Measurements of Total Wound Length:  (cm) 0.6 Width: (cm) 1.6 Depth: (cm) 0.2 Volume: (cm) 0.151 Character of Wound/Ulcer Post Debridement: Requires Further Debridement Severity of Tissue Post Debridement: Fat layer exposed Post Procedure Diagnosis Same as Pre-procedure Electronic Signature(s) Signed: 10/07/2019 4:40:23 PM By: Kela Millin Signed: 10/10/2019 7:26:40 PM By: Linton Ham MD Entered By: Linton Ham on 10/07/2019 15:31:22 -------------------------------------------------------------------------------- HPI Details Patient Name: Date of Service: Eric Furl, Tonette Morton. 10/07/2019 1:15 PM Medical Record Number: [id number] Patient Account Number: [id number] Date of Birth/Sex: Treating RN: 04-Nov-1965 (54 y.o. Eric Morton Primary Care Provider:  Geryl Rankins Other Clinician: Referring Provider: Treating Provider/Extender: Lenetta Quaker in Treatment: 0 History of Present Illness HPI Description: ADMISSION 10/07/2019 This is a 54 year old man who lives at [facility]. I am not really familiar with this facility. As I understand things he used to be part of the [facility] program however he was removed at that time he moved into this facility where he is now. Patient is able to give his history fairly clearly. He states that he has had wounds on his bilateral medial first met heads for about a month and a half. I am not clear who is dressing these and if they are what is being put on. He does have a history of bilateral lower extremity wounds including the left medial first met head and right second toe. He had several appointments with Dr. Doren Custard of vein and vascular including March 2020 at which time his arterial studies suggested some degree of infra inguinal obstruction bilaterally but he had toe pressures of 64 on the right and 59 on the left which was felt to be adequate for healing. When he last saw Barbarann Ehlers Dr. Doren Custard in January 2021 all his wounds were healed. He had  an ABI on the right of 1 on the left of 0.9 TBI of 0.57 on the right and on the left at 0.64. Dr. Doren Custard recommended serial follow-up but I do not think he has been seen since January. Dr. Mee Hives notes suggested that if he had recurrence of wounds or symptoms he would need a carefully monitored angiogram because of chronic renal failure. I am not really clear under what circumstances the current wounds developed. Also not clear what they are dressing the wounds with. The patient does not give a history of pain either in the wounds or claudication type pain. He seems quite active on his feet. Past medical history includes type 2 diabetes with peripheral neuropathy, hypertension, hyperlipidemia, CVA in 2018 gastroesophageal reflux disease, stage III chronic renal failure. He is currently a ward of the state although I am not sure what the circumstances are here. We did not repeat arterial studies today but I had like to do that Electronic Signature(s) Signed: 10/10/2019 7:26:40 PM By: Linton Ham MD Entered By: Linton Ham on 10/07/2019 15:50:59 -------------------------------------------------------------------------------- Physical Exam Details Patient Name: Date of Service: Eric Morton. 10/07/2019 1:15 PM Medical Record Number: [id number] Patient Account Number: [id number] Date of Birth/Sex: Treating RN: [birth year] (54 y.o. Eric Morton Primary Care Provider: Geryl Rankins Other Clinician: Referring Provider: Treating Provider/Extender: Tomasa Blase, Paulette Blanch in Treatment: 0 Constitutional Patient is hypotensive. However he appears well. Pulse regular and within target range for patient.[name] Respirations regular, non-labored and within target range.. Temperature is normal and within the target range for the patient.[name] Appears in no distress. Eyes Conjunctivae clear. No discharge.no icterus. Respiratory work of breathing is normal. Cardiovascular Popliteal  pulses are not palpable. There is a palpable femoral pulse on the left but not on the right. Pedal pulses absent bilaterally.. Integumentary (Hair, Skin) No primary skin issues were suggested. Neurological Reduced light touch to the monofilament although he seemed to do fairly well with vibration sense which was surprising. Notes Wound exam; mirror-image wounds on the medial side of both first metatarsal heads. Surface debris right greater than left. The wounds look dry somewhat lifeless. I went ahead with a #3 curette and clean the surfaces of both wounds hemostasis with direct pressure. There is no evidence  of surrounding infection. Bleeding was surprisingly brisk Electronic Signature(s) Signed: 10/10/2019 7:26:40 PM By: Linton Ham MD Entered By: Linton Ham on 10/07/2019 15:50:11 -------------------------------------------------------------------------------- Physician Orders Details Patient Name: Date of Service: Eric Morton. 10/07/2019 1:15 PM Medical Record Number: [id number] Patient Account Number: [id number] Date of Birth/Sex: Treating RN: 07-11-1965 (54 y.o. Eric Morton Primary Care Provider: Geryl Rankins Other Clinician: Referring Provider: Treating Provider/Extender: Lenetta Quaker in Treatment: 0 Verbal / Phone Orders: No Diagnosis Coding Follow-up Appointments ppointment in 1 week. - Friday Return A Nurse Visit: - Tuesday Dressing Change Frequency Other: - Tuesday and Friday Wound Cleansing May shower and wash wound with soap and water. - when dressing is changed. DO NOT GET WET Primary Wound Dressing Silver Collagen - moisten with hydrogel Secondary Dressing Foam Border Electronic Signature(s) Signed: 10/07/2019 4:40:23 PM By: Kela Millin Signed: 10/10/2019 7:26:40 PM By: Linton Ham MD Entered By: Kela Millin on 10/07/2019  15:25:09 -------------------------------------------------------------------------------- Problem List Details Patient Name: Date of Service: Eric Furl, Tonette Morton 10/07/2019 1:15 PM Medical Record Number: [id number] Patient Account Number: [id number] Date of Birth/Sex: Treating RN: [birth year] (54 y.o. Eric Morton Primary Care Provider: Geryl Rankins Other Clinician: Referring Provider: Treating Provider/Extender: Tomasa Blase, Paulette Blanch in Treatment: 0 Active Problems ICD-10 Encounter Code Description Active Date MDM Diagnosis E11.621 Type 2 diabetes mellitus with foot ulcer 10/07/2019 No Yes L97.518 Non-pressure chronic ulcer of other part of right foot with other specified 10/07/2019 No Yes severity L97.522 Non-pressure chronic ulcer of other part of left foot with fat layer exposed 10/07/2019 No Yes E11.51 Type 2 diabetes mellitus with diabetic peripheral angiopathy without gangrene 10/07/2019 No Yes E11.40 Type 2 diabetes mellitus with diabetic neuropathy, unspecified 10/07/2019 No Yes Inactive Problems Resolved Problems Electronic Signature(s) Signed: 10/10/2019 7:26:40 PM By: Linton Ham MD Entered By: Linton Ham on 10/07/2019 15:30:20 -------------------------------------------------------------------------------- Progress Note Details Patient Name: Date of Service: Eric Morton. 10/07/2019 1:15 PM Medical Record Number: [id number] Patient Account Number: [id number] Date of Birth/Sex: Treating RN: 05-30-1965 (54 y.o. Eric Morton Primary Care Provider: Geryl Rankins Other Clinician: Referring Provider: Treating Provider/Extender: Tomasa Blase, Paulette Blanch in Treatment: 0 Subjective Chief Complaint Information obtained from Patient 10/07/2019; patient is here for review of wounds on his bilateral feet History of Present Illness (HPI) ADMISSION 10/07/2019 This is a 54 year old man who lives at [facility]. I am not really familiar with this facility. As I understand things he used to be part of the [facility] program however he was removed at that time he moved into this facility where he is now. Patient is able to give his history fairly clearly. He states that he has had wounds on his bilateral medial first met heads for about a month and a half. I am not clear who is dressing these and if they are what is being put on. He does have a history of bilateral lower extremity wounds including the left medial first met head and right second toe. He had several appointments with Dr. Doren Custard of vein and vascular including March 2020 at which time his arterial studies suggested some degree of infra inguinal obstruction bilaterally but he had toe pressures of 64 on the right and 59 on the left which was felt to be adequate for healing. When he last saw Barbarann Ehlers Dr. Doren Custard in January 2021 all his wounds were healed. He had an ABI on the right of 1 on the left  of 0.9 TBI of 0.57 on the right and on the left at 0.64. Dr. Doren Custard recommended serial follow-up but I do not think he has been seen since January. Dr. Mee Hives notes suggested that if he had recurrence of wounds or symptoms he would need a carefully monitored angiogram because of chronic renal failure. I am not really clear under what circumstances the current wounds developed. Also not clear what they are dressing the wounds with. The patient does not give a history of pain either in the wounds or claudication type pain. He seems quite active on his feet. Past medical history includes type 2 diabetes with peripheral neuropathy, hypertension, hyperlipidemia, CVA in 2018 gastroesophageal reflux disease, stage III chronic renal failure. He is currently a ward of the state although I am not sure what the circumstances are here. We did not repeat arterial studies today but I had like to do that Patient History Information obtained from  Patient. Allergies Lipitor Family History Diabetes - Mother,Maternal Grandparents, Hypertension - Mother,Maternal Grandparents,Siblings, Stroke - Maternal Grandparents, Thyroid Problems - Siblings, No family history of Cancer, Heart Disease, Hereditary Spherocytosis, Kidney Disease, Lung Disease, Seizures, Tuberculosis. Social History Former smoker, Marital Status - Widowed, Alcohol Use - Never, Drug Use - No History, Caffeine Use - Daily. Medical History Eyes Denies history of Cataracts, Glaucoma, Optic Neuritis Ear/Nose/Mouth/Throat Denies history of Chronic sinus problems/congestion, Middle ear problems Hematologic/Lymphatic Denies history of Anemia, Hemophilia, Human Immunodeficiency Virus, Lymphedema, Sickle Cell Disease Respiratory Denies history of Aspiration, Asthma, Chronic Obstructive Pulmonary Disease (COPD), Pneumothorax, Sleep Apnea, Tuberculosis Cardiovascular Patient has history of Hypertension, Peripheral Venous Disease Denies history of Angina, Arrhythmia, Congestive Heart Failure, Coronary Artery Disease, Deep Vein Thrombosis, Hypotension, Myocardial Infarction, Peripheral Arterial Disease, Phlebitis, Vasculitis Gastrointestinal Denies history of Cirrhosis , Colitis, Crohnoos, Hepatitis A, Hepatitis B, Hepatitis C Endocrine Patient has history of Type II Diabetes Denies history of Type I Diabetes Genitourinary Denies history of End Stage Renal Disease Immunological Denies history of Lupus Erythematosus, Raynaudoos, Scleroderma Integumentary (Skin) Denies history of History of Burn Musculoskeletal Denies history of Gout, Rheumatoid Arthritis, Osteoarthritis, Osteomyelitis Neurologic Denies history of Dementia, Neuropathy, Quadriplegia, Paraplegia, Seizure Disorder Oncologic Denies history of Received Chemotherapy, Received Radiation Psychiatric Denies history of Anorexia/bulimia, Confinement Anxiety Patient is treated with Insulin. Blood sugar is not  tested. Review of Systems (ROS) Constitutional Symptoms (General Health) Denies complaints or symptoms of Fatigue, Fever, Chills, Marked Weight Change. Eyes Denies complaints or symptoms of Dry Eyes, Vision Changes, Glasses / Contacts. Ear/Nose/Mouth/Throat Denies complaints or symptoms of Chronic sinus problems or rhinitis. Respiratory Denies complaints or symptoms of Chronic or frequent coughs, Shortness of Breath. Cardiovascular Denies complaints or symptoms of Chest pain. Gastrointestinal Denies complaints or symptoms of Frequent diarrhea, Nausea, Vomiting. Endocrine Denies complaints or symptoms of Heat/cold intolerance. Genitourinary Denies complaints or symptoms of Frequent urination. Integumentary (Skin) Complains or has symptoms of Wounds. Musculoskeletal Denies complaints or symptoms of Muscle Pain, Muscle Weakness. Neurologic Denies complaints or symptoms of Numbness/parasthesias. Psychiatric Denies complaints or symptoms of Claustrophobia, Suicidal. Objective Constitutional Patient is hypotensive. However he appears well. Pulse regular and within target range for patient.[name] Respirations regular, non-labored and within target range.. Temperature is normal and within the target range for the patient.[name] Appears in no distress. Vitals Time Taken: 2:34 PM, Height: 68 in, Source: Stated, Weight: 185 lbs, Source: Stated, BMI: 28.1, Temperature: 97.9 F, Pulse: 89 bpm, Respiratory Rate: 18 breaths/min, Blood Pressure: 96/66 mmHg. Eyes Conjunctivae clear. No discharge.no icterus. Respiratory work of breathing is normal.  Cardiovascular Popliteal pulses are not palpable. There is a palpable femoral pulse on the left but not on the right. Pedal pulses absent bilaterally.. Neurological Reduced light touch to the monofilament although he seemed to do fairly well with vibration sense which was surprising. General Notes: Wound exam; mirror-image wounds on the medial side of both  first metatarsal heads. Surface debris right greater than left. The wounds look dry somewhat lifeless. I went ahead with a #3 curette and clean the surfaces of both wounds hemostasis with direct pressure. There is no evidence of surrounding infection. Bleeding was surprisingly brisk Integumentary (Hair, Skin) No primary skin issues were suggested. Wound #1 status is Open. Original cause of wound was Blister. The wound is located on the Right,Medial Foot. The wound measures 1cm length x 1.7cm width x 0.2cm depth; 1.335cm^2 area and 0.267cm^3 volume. There is Fat Layer (Subcutaneous Tissue) exposed. There is no tunneling or undermining noted. There is a medium amount of serosanguineous drainage noted. There is medium (34-66%) red granulation within the wound bed. There is a medium (34-66%) amount of necrotic tissue within the wound bed including Adherent Slough. Wound #2 status is Open. Original cause of wound was Blister. The wound is located on the Left,Medial Foot. The wound measures 0.6cm length x 1.6cm width x 0.2cm depth; 0.754cm^2 area and 0.151cm^3 volume. There is Fat Layer (Subcutaneous Tissue) exposed. There is no tunneling or undermining noted. There is a medium amount of serosanguineous drainage noted. There is small (1-33%) red granulation within the wound bed. There is a large (67-100%) amount of necrotic tissue within the wound bed including Eschar and Adherent Slough. Assessment Active Problems ICD-10 Type 2 diabetes mellitus with foot ulcer Non-pressure chronic ulcer of other part of right foot with other specified severity Non-pressure chronic ulcer of other part of left foot with fat layer exposed Type 2 diabetes mellitus with diabetic peripheral angiopathy without gangrene Type 2 diabetes mellitus with diabetic neuropathy, unspecified Procedures Wound #1 Pre-procedure diagnosis of Wound #1 is a Diabetic Wound/Ulcer of the Lower Extremity located on the Right,Medial Foot  .Severity of Tissue Pre Debridement is: Fat layer exposed. There was a Excisional Skin/Subcutaneous Tissue Debridement with a total area of 1.7 sq cm performed by Ricard Dillon., MD. With the following instrument(s): Curette to remove Viable and Non-Viable tissue/material. Material removed includes Eschar, Subcutaneous Tissue, and Slough after achieving pain control using Other (benzocaine, 20%). No specimens were taken. A time out was conducted at 15:20, prior to the start of the procedure. A Minimum amount of bleeding was controlled with Pressure. The procedure was tolerated well with a pain level of 0 throughout and a pain level of 0 following the procedure. Post Debridement Measurements: 1cm length x 1.7cm width x 0.2cm depth; 0.267cm^3 volume. Character of Wound/Ulcer Post Debridement requires further debridement. Severity of Tissue Post Debridement is: Fat layer exposed. Post procedure Diagnosis Wound #1: Same as Pre-Procedure Wound #2 Pre-procedure diagnosis of Wound #2 is a Diabetic Wound/Ulcer of the Lower Extremity located on the Left,Medial Foot .Severity of Tissue Pre Debridement is: Fat layer exposed. There was a Excisional Skin/Subcutaneous Tissue Debridement with a total area of 0.96 sq cm performed by Ricard Dillon., MD. With the following instrument(s): Curette to remove Viable and Non-Viable tissue/material. Material removed includes Eschar, Subcutaneous Tissue, and Slough after achieving pain control using Other (benzocaine, 20%). No specimens were taken. A time out was conducted at 15:20, prior to the start of the procedure. A Minimum amount of bleeding  was controlled with Pressure. The procedure was tolerated well with a pain level of 0 throughout and a pain level of 0 following the procedure. Post Debridement Measurements: 0.6cm length x 1.6cm width x 0.2cm depth; 0.151cm^3 volume. Character of Wound/Ulcer Post Debridement requires further debridement. Severity of Tissue  Post Debridement is: Fat layer exposed. Post procedure Diagnosis Wound #2: Same as Pre-Procedure Plan Follow-up Appointments: Return Appointment in 1 week. - Friday Nurse Visit: - Tuesday Dressing Change Frequency: Other: - Tuesday and Friday Wound Cleansing: May shower and wash wound with soap and water. - when dressing is changed. DO NOT GET WET Primary Wound Dressing: Silver Collagen - moisten with hydrogel Secondary Dressing: Foam Border 1. After some consideration we elected to use silver collagen under border foam. I am going to try to keep this in place until Tuesday and then will bring him back to clinic to change it and I will see it again next week. This may not hold up. 2. The problem is he has Medicaid [employer]. It is unlikely were going to be able to get home health. Further complicating is the absence of Medicaid support for wound care supplies 3. The patient has significant PAD and for a. Dr. Doren Custard felt he might need an angiogram although the wounds that he had in 2020 eventually healed. He was felt to have decent toe pressures at the time and that turned out to be correct and he did heal the wounds 4. Again currently he has no suggestion of claudication. We will see if we can get these to close without referral back to vein and vascular however if they deteriorate that would clearly be necessary. 5. No evidence of infection no cultures were done. I do not think any x-rays are necessary I spent 45 minutes in review of this patient's past medical history especially Dr. Mee Hives consultations, ABIs, face-to-face evaluation and preparation of this record Electronic Signature(s) Signed: 10/10/2019 7:26:40 PM By: Linton Ham MD Entered By: Linton Ham on 10/07/2019 15:53:48 -------------------------------------------------------------------------------- HxROS Details Patient Name: Date of Service: Eric Morton. 10/07/2019 1:15 PM Medical Record Number:  [id number] Patient Account Number: [id number] Date of Birth/Sex: Treating RN: 09-01-1965 (54 y.o. Oval Linsey Primary Care Provider: Geryl Rankins Other Clinician: Referring Provider: Treating Provider/Extender: Lenetta Quaker in Treatment: 0 Information Obtained From Patient Constitutional Symptoms (General Health) Complaints and Symptoms: Negative for: Fatigue; Fever; Chills; Marked Weight Change Eyes Complaints and Symptoms: Negative for: Dry Eyes; Vision Changes; Glasses / Contacts Medical History: Negative for: Cataracts; Glaucoma; Optic Neuritis Ear/Nose/Mouth/Throat Complaints and Symptoms: Negative for: Chronic sinus problems or rhinitis Medical History: Negative for: Chronic sinus problems/congestion; Middle ear problems Respiratory Complaints and Symptoms: Negative for: Chronic or frequent coughs; Shortness of Breath Medical History: Negative for: Aspiration; Asthma; Chronic Obstructive Pulmonary Disease (COPD); Pneumothorax; Sleep Apnea; Tuberculosis Cardiovascular Complaints and Symptoms: Negative for: Chest pain Medical History: Positive for: Hypertension; Peripheral Venous Disease Negative for: Angina; Arrhythmia; Congestive Heart Failure; Coronary Artery Disease; Deep Vein Thrombosis; Hypotension; Myocardial Infarction; Peripheral Arterial Disease; Phlebitis; Vasculitis Gastrointestinal Complaints and Symptoms: Negative for: Frequent diarrhea; Nausea; Vomiting Medical History: Negative for: Cirrhosis ; Colitis; Crohns; Hepatitis A; Hepatitis B; Hepatitis C Endocrine Complaints and Symptoms: Negative for: Heat/cold intolerance Medical History: Positive for: Type II Diabetes Negative for: Type I Diabetes Time with diabetes: 58 Treated with: Insulin Blood sugar tested every day: No Genitourinary Complaints and Symptoms: Negative for: Frequent urination Medical History: Negative for: End Stage Renal  Disease Integumentary  (Skin) Complaints and Symptoms: Positive for: Wounds Medical History: Negative for: History of Burn Musculoskeletal Complaints and Symptoms: Negative for: Muscle Pain; Muscle Weakness Medical History: Negative for: Gout; Rheumatoid Arthritis; Osteoarthritis; Osteomyelitis Neurologic Complaints and Symptoms: Negative for: Numbness/parasthesias Medical History: Negative for: Dementia; Neuropathy; Quadriplegia; Paraplegia; Seizure Disorder Psychiatric Complaints and Symptoms: Negative for: Claustrophobia; Suicidal Medical History: Negative for: Anorexia/bulimia; Confinement Anxiety Hematologic/Lymphatic Medical History: Negative for: Anemia; Hemophilia; Human Immunodeficiency Virus; Lymphedema; Sickle Cell Disease Immunological Medical History: Negative for: Lupus Erythematosus; Raynauds; Scleroderma Oncologic Medical History: Negative for: Received Chemotherapy; Received Radiation Immunizations Pneumococcal Vaccine: Received Pneumococcal Vaccination: No Implantable Devices None Family and Social History Cancer: No; Diabetes: Yes - Mother,Maternal Grandparents; Heart Disease: No; Hereditary Spherocytosis: No; Hypertension: Yes - Mother,Maternal Grandparents,Siblings; Kidney Disease: No; Lung Disease: No; Seizures: No; Stroke: Yes - Maternal Grandparents; Thyroid Problems: Yes - Siblings; Tuberculosis: No; Former smoker; Marital Status - Widowed; Alcohol Use: Never; Drug Use: No History; Caffeine Use: Daily; Financial Concerns: No; Food, Clothing or Shelter Needs: No; Support System Lacking: No; Transportation Concerns: No Electronic Signature(s) Signed: 10/07/2019 4:58:04 PM By: Carlene Coria RN Signed: 10/10/2019 7:26:40 PM By: Linton Ham MD Entered By: Carlene Coria on 10/07/2019 14:45:11 -------------------------------------------------------------------------------- SuperBill Details Patient Name: Date of Service: Eric Furl, Tonette Morton 10/07/2019 Medical Record Number:  [id number] Patient Account Number: [id number] Date of Birth/Sex: Treating RN: [birth year] (54 y.o. 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 E11.51 Type 2 diabetes mellitus with diabetic peripheral angiopathy without gangrene E11.40 Type 2 diabetes mellitus with diabetic neuropathy, unspecified Facility Procedures CPT4 Code: 06301601 Description: [facility] VISIT-LEV 3 EST PT Modifier: 25 Quantity: 1 CPT4 Code: 09323557 Description: [place] - DEB SUBQ TISSUE 20 SQ CM/< ICD-10 Diagnosis Description L97.518 Non-pressure chronic ulcer of other part of right foot with other specified sever Modifier: ity Quantity: 1 Physician Procedures : CPT4 Code Description Modifier [id number] - WC PHYS LEVEL 4 - NEW PT 25 ICD-10 Diagnosis Description E11.621 Type 2 diabetes mellitus with foot ulcer L97.518 Non-pressure chronic ulcer of other part of right foot with other specified severity  L97.522 Non-pressure chronic ulcer of other part of left foot with fat layer exposed E11.51 Type 2 diabetes mellitus with diabetic peripheral angiopathy without gangrene Quantity: 1 : [id number] - WC PHYS SUBQ TISS 20 SQ CM ICD-10 Diagnosis Description L97.518 Non-pressure chronic ulcer of other part of right foot with other specified severity Quantity: 1 Electronic Signature(s) Signed: 10/10/2019 7:26:40 PM By: Linton Ham MD Entered By: Linton Ham on 10/07/2019 15:54:18

## 2019-10-10 NOTE — Progress Notes (Signed)
EUCLIDE, GRANITO ([id number]) Visit Report for 10/07/2019 Allergy List Details Patient Name: Date of Service: Eric Morton 10/07/2019 1:15 PM Medical Record Number: [id number] Patient Account Number: [id number] Date of Birth/Sex: Treating RN: 1965-04-20 (54 y.o. Jerilynn Mages) Carlene Coria Primary Care Donnae Michels: Geryl Rankins Other Clinician: Referring Rola Lennon: Treating Jahvon Gosline/Extender: Tomasa Blase, Army Melia Weeks in Treatment: 0 Allergies Active Allergies Lipitor Allergy Notes Electronic Signature(s) Signed: 10/07/2019 4:58:04 PM By: Carlene Coria RN Entered By: Carlene Coria on 10/07/2019 14:36:01 -------------------------------------------------------------------------------- Arrival Information Details Patient Name: Date of Service: Eric Morton. 10/07/2019 1:15 PM Medical Record Number: [id number] Patient Account Number: [id number] Date of Birth/Sex: Treating RN: 03/08/1965 (54 y.o. Oval Linsey Primary Care Vidal Lampkins: Geryl Rankins Other Clinician: Referring Kathleen Likins: Treating Rex Magee/Extender: Lenetta Quaker in Treatment: 0 Visit Information Patient Arrived: Wheel Chair Arrival Time: 14:34 Accompanied By: caregiver Transfer Assistance: None Patient Identification Verified: Yes Secondary Verification Process Completed: Yes Patient Requires Transmission-Based Precautions: No Patient Has Alerts: Yes Patient Alerts: L ABI 1.0 R. ABI .90 Electronic Signature(s) Signed: 10/07/2019 4:58:04 PM By: Carlene Coria RN Entered By: Carlene Coria on 10/07/2019 14:55:45 -------------------------------------------------------------------------------- Clinic Level of Care Assessment Details Patient Name: Date of Service: Eric Morton 10/07/2019 1:15 PM Medical Record Number: [id number] Patient Account Number: [id number] Date of Birth/Sex: Treating RN: 26-Feb-1965 (54 y.o. [name] Primary Care Davontay Watlington: Geryl Rankins Other  Clinician: Referring Taisia Fantini: Treating Jazira Maloney/Extender: Lenetta Quaker in Treatment: 0 Clinic Level of Care Assessment Items TOOL 1 Quantity Score X- 1 0 Use when EandM and Procedure is performed on INITIAL visit ASSESSMENTS - Nursing Assessment / Reassessment X- 1 20 General Physical Exam (combine w/ comprehensive assessment (listed just below) when performed on new pt. evals) X- 1 25 Comprehensive Assessment (HX, ROS, Risk Assessments, Wounds Hx, etc.) ASSESSMENTS - Wound and Skin Assessment / Reassessment []  - 0 Dermatologic / Skin Assessment (not related to wound area) ASSESSMENTS - Ostomy and/or Continence Assessment and Care []  - 0 Incontinence Assessment and Management []  - 0 Ostomy Care Assessment and Management (repouching, etc.) PROCESS - Coordination of Care X - Simple Patient / Family Education for ongoing care 1 15 []  - 0 Complex (extensive) Patient / Family Education for ongoing care X- 1 10 Staff obtains Programmer, systems, Records, T Results / Process Orders est []  - 0 Staff telephones HHA, Nursing Homes / Clarify orders / etc []  - 0 Routine Transfer to another Facility (non-emergent condition) []  - 0 Routine Hospital Admission (non-emergent condition) X- 1 15 New Admissions / Biomedical engineer / Ordering NPWT Apligraf, etc. , []  - 0 Emergency Hospital Admission (emergent condition) PROCESS - Special Needs []  - 0 Pediatric / Minor Patient Management []  - 0 Isolation Patient Management []  - 0 Hearing / Language / Visual special needs []  - 0 Assessment of Community assistance (transportation, D/C planning, etc.) []  - 0 Additional assistance / Altered mentation []  - 0 Support Surface(s) Assessment (bed, cushion, seat, etc.) INTERVENTIONS - Miscellaneous []  - 0 External ear exam []  - 0 Patient Transfer (multiple staff / Civil Service fast streamer / Similar devices) []  - 0 Simple Staple / Suture removal (25 or less) []  - 0 Complex Staple /  Suture removal (26 or more) []  - 0 Hypo/Hyperglycemic Management (do not check if billed separately) []  - 0 Ankle / Brachial Index (ABI) - do not check if billed separately Has the patient been seen at the hospital within the last three years: Yes Total  Score: 85 Level Of Care: New/Established - Level 3 Electronic Signature(s) Signed: 10/07/2019 4:40:23 PM By: Kela Millin Entered By: Kela Millin on 10/07/2019 15:15:55 -------------------------------------------------------------------------------- Lower Extremity Assessment Details Patient Name: Date of Service: Eric Morton 10/07/2019 1:15 PM Medical Record Number: [id number] Patient Account Number: [id number] Date of Birth/Sex: Treating RN: Jan 22, 1966 (54 y.o. Oval Linsey Primary Care Shwanda Soltis: Geryl Rankins Other Clinician: Referring Kamesha Herne: Treating Aryiah Monterosso/Extender: Tomasa Blase, Army Melia Weeks in Treatment: 0 Edema Assessment Assessed: [Left: No] [Right: No] E[Left: dema] [Right: :] Calf Left: Right: Point of Measurement: 39 cm From Medial Instep 36 cm 35 cm Ankle Left: Right: Point of Measurement: 8 cm From Medial Instep 24 cm 23 cm Electronic Signature(s) Signed: 10/07/2019 4:58:04 PM By: Carlene Coria RN Entered By: Carlene Coria on 10/07/2019 14:54:09 -------------------------------------------------------------------------------- Multi Wound Chart Details Patient Name: Date of Service: Eric Morton, Eric Lederer. 10/07/2019 1:15 PM Medical Record Number: [id number] Patient Account Number: [id number] Date of Birth/Sex: Treating RN: 06-Dec-1965 (54 y.o. [name] Primary Care Beverlyann Broxterman: Geryl Rankins Other Clinician: Referring Alexandros Ewan: Treating Daiveon Markman/Extender: Tomasa Blase, Paulette Blanch in Treatment: 0 Vital Signs Height(in): 68 Pulse(bpm): 89 Weight(lbs): 185 Blood Pressure(mmHg): 96/66 Body Mass Index(BMI): 28 Temperature(F): 97.9 Respiratory  Rate(breaths/min): 18 Photos: [1:No Photos Right, Medial Foot] [2:No Photos Left, Medial Foot] [N/A:N/A N/A] Wound Location: [1:Blister] [2:Blister] [N/A:N/A] Wounding Event: [1:Diabetic Wound/Ulcer of the Lower] [2:Diabetic Wound/Ulcer of the Lower] [N/A:N/A] Primary Etiology: [1:Extremity Hypertension, Peripheral Venous] [2:Extremity Hypertension, Peripheral Venous] [N/A:N/A] Comorbid History: [1:Disease, Type II Diabetes 08/29/2019] [2:Disease, Type II Diabetes 08/29/2019] [N/A:N/A] Date Acquired: [1:0] [2:0] [N/A:N/A] Weeks of Treatment: [1:Open] [2:Open] [N/A:N/A] Wound Status: [1:1x1.7x0.2] [2:0.6x1.6x0.2] [N/A:N/A] Measurements L x W x D (cm) [1:1.335] [2:0.754] [N/A:N/A] A (cm) : rea [1:0.267] [2:0.151] [N/A:N/A] Volume (cm) : [1:Grade 2] [2:Grade 2] [N/A:N/A] Classification: [1:Medium] [2:Medium] [N/A:N/A] Exudate A mount: [1:Serosanguineous] [2:Serosanguineous] [N/A:N/A] Exudate Type: [1:red, brown] [2:red, brown] [N/A:N/A] Exudate Color: [1:Medium (34-66%)] [2:Small (1-33%)] [N/A:N/A] Granulation Amount: [1:Red] [2:Red] [N/A:N/A] Granulation Quality: [1:Medium (34-66%)] [2:Large (67-100%)] [N/A:N/A] Necrotic Amount: [1:Adherent Slough] [2:Eschar, Adherent Slough] [N/A:N/A] Necrotic Tissue: [1:Fat Layer (Subcutaneous Tissue): Yes Fat Layer (Subcutaneous Tissue): Yes N/A] Exposed Structures: [1:Fascia: No Tendon: No Muscle: No Joint: No Bone: No None] [2:Fascia: No Tendon: No Muscle: No Joint: No Bone: No None] [N/A:N/A] Epithelialization: [1:Debridement - Excisional] [2:Debridement - Excisional] [N/A:N/A] Debridement: Pre-procedure Verification/Time Out 15:20 [2:15:20] [N/A:N/A] Taken: [1:Other] [2:Other] [N/A:N/A] Pain Control: [1:Necrotic/Eschar, Subcutaneous,] [2:Necrotic/Eschar, Subcutaneous,] [N/A:N/A] Tissue Debrided: [1:Slough Skin/Subcutaneous Tissue] [2:Slough Skin/Subcutaneous Tissue] [N/A:N/A] Level: [1:1.7] [2:0.96] [N/A:N/A] Debridement A (sq cm): [1:rea Curette]  [2:Curette] [N/A:N/A] Instrument: [1:Minimum] [2:Minimum] [N/A:N/A] Bleeding: [1:Pressure] [2:Pressure] [N/A:N/A] Hemostasis Achieved: [1:0] [2:0] [N/A:N/A] Procedural Pain: [1:0] [2:0] [N/A:N/A] Post Procedural Pain: Debridement Treatment Response: Procedure was tolerated well [2:Procedure was tolerated well] [N/A:N/A] Post Debridement Measurements L x 1x1.7x0.2 [2:0.6x1.6x0.2] [N/A:N/A] W x D (cm) [1:0.267] [2:0.151] [N/A:N/A] Post Debridement Volume: (cm) [1:Debridement] [2:Debridement] [N/A:N/A] Treatment Notes Electronic Signature(s) Signed: 10/07/2019 4:40:23 PM By: Kela Millin Signed: 10/10/2019 7:26:40 PM By: Linton Ham MD Entered By: Linton Ham on 10/07/2019 15:30:53 -------------------------------------------------------------------------------- Multi-Disciplinary Care Plan Details Patient Name: Date of Service: Eric Morton, Eric Lederer. 10/07/2019 1:15 PM Medical Record Number: [id number] Patient Account Number: [id number] Date of Birth/Sex: Treating RN: [birth year] (54 y.o. [name] Primary Care Ruslan Mccabe: Geryl Rankins Other Clinician: Referring Akosua Constantine: Treating Betsey Sossamon/Extender: Lenetta Quaker in Treatment: 0 Active Inactive Nutrition Nursing Diagnoses: Impaired glucose control: actual or potential Goals: Patient/caregiver verbalizes understanding of need to maintain therapeutic glucose  control per primary care physician Date Initiated: 10/07/2019 Target Resolution Date: 11/04/2019 Goal Status: Active Interventions: Provide education on elevated blood sugars and impact on wound healing Notes: Wound/Skin Impairment Nursing Diagnoses: Impaired tissue integrity Goals: Ulcer/skin breakdown will have a volume reduction of 50% by week 8 Date Initiated: 10/07/2019 Target Resolution Date: 11/04/2019 Goal Status: Active Interventions: Provide education on ulcer and skin care Notes: Electronic Signature(s) Signed: 10/07/2019  4:40:23 PM By: Kela Millin Entered By: Kela Millin on 10/07/2019 15:15:14 -------------------------------------------------------------------------------- Pain Assessment Details Patient Name: Date of Service: Eric Morton 10/07/2019 1:15 PM Medical Record Number: [id number] Patient Account Number: [id number] Date of Birth/Sex: Treating RN: 1965/10/04 (54 y.o. Oval Linsey Primary Care Jewelia Bocchino: Geryl Rankins Other Clinician: Referring Laycee Fitzsimmons: Treating Randilyn Foisy/Extender: Tomasa Blase, Paulette Blanch in Treatment: 0 Active Problems Location of Pain Severity and Description of Pain Patient Has Paino No Site Locations Pain Management and Medication Current Pain Management: Electronic Signature(s) Signed: 10/07/2019 4:58:04 PM By: Carlene Coria RN Entered By: Carlene Coria on 10/07/2019 15:00:41 -------------------------------------------------------------------------------- Patient/Caregiver Education Details Patient Name: Date of Service: Eric Morton 9/10/2021andnbsp1:15 PM Medical Record Number: [id number] Patient Account Number: [id number] Date of Birth/Gender: Treating RN: Mar 11, 1965 (54 y.o. [name] Primary Care Physician: Geryl Rankins Other Clinician: Referring Physician: Treating Physician/Extender: Lenetta Quaker in Treatment: 0 Education Assessment Education Provided To: Patient Education Topics Provided Elevated Blood Sugar/ Impact on Healing: Handouts: Elevated Blood Sugars: How Do They Affect Wound Healing Methods: Explain/Verbal Responses: State content correctly Wound/Skin Impairment: Handouts: Caring for Your Ulcer Methods: Explain/Verbal Responses: State content correctly Electronic Signature(s) Signed: 10/07/2019 4:40:23 PM By: Kela Millin Entered By: Kela Millin on 10/07/2019  15:15:27 -------------------------------------------------------------------------------- Wound Assessment Details Patient Name: Date of Service: Eric Morton. 10/07/2019 1:15 PM Medical Record Number: [id number] Patient Account Number: [id number] Date of Birth/Sex: Treating RN: 30-Jan-1965 (54 y.o. Oval Linsey Primary Care Kacin Dancy: Geryl Rankins Other Clinician: Referring Luevenia Mcavoy: Treating Antrice Pal/Extender: Tomasa Blase, Army Melia Weeks in Treatment: 0 Wound Status Wound Number: 1 Primary Etiology: Diabetic Wound/Ulcer of the Lower Extremity Wound Location: Right, Medial Foot Wound Status: Open Wounding Event: Blister Comorbid History: Hypertension, Peripheral Venous Disease, Type II Diabetes Date Acquired: 08/29/2019 Weeks Of Treatment: 0 Clustered Wound: No Wound Measurements Length: (cm) 1 Width: (cm) 1.7 Depth: (cm) 0.2 Area: (cm) 1.335 Volume: (cm) 0.267 % Reduction in Area: % Reduction in Volume: Epithelialization: None Tunneling: No Undermining: No Wound Description Classification: Grade 2 Exudate Amount: Medium Exudate Type: Serosanguineous Exudate Color: red, brown Foul Odor After Cleansing: No Slough/Fibrino Yes Wound Bed Granulation Amount: Medium (34-66%) Exposed Structure Granulation Quality: Red Fascia Exposed: No Necrotic Amount: Medium (34-66%) Fat Layer (Subcutaneous Tissue) Exposed: Yes Necrotic Quality: Adherent Slough Tendon Exposed: No Muscle Exposed: No Joint Exposed: No Bone Exposed: No Electronic Signature(s) Signed: 10/07/2019 4:58:04 PM By: Carlene Coria RN Entered By: Carlene Coria on 10/07/2019 14:58:16 -------------------------------------------------------------------------------- Wound Assessment Details Patient Name: Date of Service: Eric Morton 10/07/2019 1:15 PM Medical Record Number: [id number] Patient Account Number: [id number] Date of Birth/Sex: Treating RN: Aug 24, 1965 (54 y.o. Oval Linsey Primary Care Catheryne Deford: Geryl Rankins Other Clinician: Referring Karlei Waldo: Treating Aedon Deason/Extender: Tomasa Blase, Army Melia Weeks in Treatment: 0 Wound Status Wound Number: 2 Primary Etiology: Diabetic Wound/Ulcer of the Lower Extremity Wound Location: Left, Medial Foot Wound Status: Open Wounding Event: Blister Comorbid History: Hypertension, Peripheral Venous Disease, Type II Diabetes Date Acquired: 08/29/2019 Weeks Of Treatment: 0 Clustered Wound:  No Wound Measurements Length: (cm) 0.6 Width: (cm) 1.6 Depth: (cm) 0.2 Area: (cm) 0.754 Volume: (cm) 0.151 % Reduction in Area: % Reduction in Volume: Epithelialization: None Tunneling: No Undermining: No Wound Description Classification: Grade 2 Exudate Amount: Medium Exudate Type: Serosanguineous Exudate Color: red, brown Foul Odor After Cleansing: No Slough/Fibrino Yes Wound Bed Granulation Amount: Small (1-33%) Exposed Structure Granulation Quality: Red Fascia Exposed: No Necrotic Amount: Large (67-100%) Fat Layer (Subcutaneous Tissue) Exposed: Yes Necrotic Quality: Eschar, Adherent Slough Tendon Exposed: No Muscle Exposed: No Joint Exposed: No Bone Exposed: No Electronic Signature(s) Signed: 10/07/2019 4:58:04 PM By: Carlene Coria RN Entered By: Carlene Coria on 10/07/2019 14:59:21 -------------------------------------------------------------------------------- Vitals Details Patient Name: Date of Service: Eric Morton, Eric Lederer. 10/07/2019 1:15 PM Medical Record Number: [id number] Patient Account Number: [id number] Date of Birth/Sex: Treating RN: 25-Feb-1965 (54 y.o. Oval Linsey Primary Care Jayjay Littles: Geryl Rankins Other Clinician: Referring Hali Balgobin: Treating Daemien Fronczak/Extender: Tomasa Blase, Paulette Blanch in Treatment: 0 Vital Signs Time Taken: 14:34 Temperature (F): 97.9 Height (in): 68 Pulse (bpm): 89 Source: Stated Respiratory Rate (breaths/min): 18 Weight (lbs):  185 Blood Pressure (mmHg): 96/66 Source: Stated Reference Range: 80 - 120 mg / dl Body Mass Index (BMI): 28.1 Electronic Signature(s) Signed: 10/07/2019 4:58:04 PM By: Carlene Coria RN Entered By: Carlene Coria on 10/07/2019 14:35:36

## 2019-10-11 ENCOUNTER — Other Ambulatory Visit: Payer: Self-pay

## 2019-10-11 ENCOUNTER — Encounter (HOSPITAL_BASED_OUTPATIENT_CLINIC_OR_DEPARTMENT_OTHER): Payer: Medicaid Other | Admitting: Internal Medicine

## 2019-10-11 DIAGNOSIS — E11621 Type 2 diabetes mellitus with foot ulcer: Secondary | ICD-10-CM | POA: Diagnosis not present

## 2019-10-12 NOTE — Progress Notes (Signed)
GLYNDON, TURSI ([id number]) Visit Report for 10/11/2019 Arrival Information Details Patient Name: Date of Service: Eric Morton 10/11/2019 9:15 A M Medical Record Number: [id number] Patient Account Number: [id number] Date of Birth/Sex: Treating RN: Oct 14, 1965 (54 y.o. Male) Levan Hurst Primary Care Willena Jeancharles: Geryl Rankins Other Clinician: Referring Keegan Bensch: Treating Moksh Loomer/Extender: Lenetta Quaker in Treatment: 0 Visit Information History Since Last Visit Added or deleted any medications: No Patient Arrived: Gilford Rile Any new allergies or adverse reactions: No Arrival Time: 10:00 Had a fall or experienced change in No Accompanied By: alone activities of daily living that may affect Transfer Assistance: None risk of falls: Patient Identification Verified: Yes Signs or symptoms of abuse/neglect since last visito No Secondary Verification Process Completed: Yes Hospitalized since last visit: No Patient Requires Transmission-Based Precautions: No Implantable device outside of the clinic excluding No Patient Has Alerts: Yes cellular tissue based products placed in the center Patient Alerts: L ABI 1.0 since last visit: R. ABI .90 Has Dressing in Place as Prescribed: Yes Pain Present Now: No Electronic Signature(s) Signed: 10/12/2019 5:57:30 PM By: Levan Hurst RN, BSN Entered By: Levan Hurst on 10/11/2019 10:05:48 -------------------------------------------------------------------------------- Clinic Level of Care Assessment Details Patient Name: Date of Service: Eric Morton. 10/11/2019 9:15 A M Medical Record Number: [id number] Patient Account Number: [id number] Date of Birth/Sex: Treating RN: Mar 01, 1965 (54 y.o. Male) Levan Hurst Primary Care Dolores Shellhammer: Geryl Rankins Other Clinician: Referring [name]: Treating [name]/Extender: Lenetta Quaker in Treatment: 0 Clinic Level of Care Assessment Items TOOL 4 Quantity  Score X- 1 0 Use when only an EandM is performed on FOLLOW-UP visit ASSESSMENTS - Nursing Assessment / Reassessment X- 1 10 Reassessment of Co-morbidities (includes updates in patient status) X- 1 5 Reassessment of Adherence to Treatment Plan ASSESSMENTS - Wound and Skin A ssessment / Reassessment []  - 0 Simple Wound Assessment / Reassessment - one wound X- 2 5 Complex Wound Assessment / Reassessment - multiple wounds []  - 0 Dermatologic / Skin Assessment (not related to wound area) ASSESSMENTS - Focused Assessment []  - 0 Circumferential Edema Measurements - multi extremities []  - 0 Nutritional Assessment / Counseling / Intervention X- 1 5 Lower Extremity Assessment (monofilament, tuning fork, pulses) []  - 0 Peripheral Arterial Disease Assessment (using hand held doppler) ASSESSMENTS - Ostomy and/or Continence Assessment and Care []  - 0 Incontinence Assessment and Management []  - 0 Ostomy Care Assessment and Management (repouching, etc.) PROCESS - Coordination of Care X - Simple Patient / Family Education for ongoing care 1 15 []  - 0 Complex (extensive) Patient / Family Education for ongoing care []  - 0 Staff obtains Programmer, systems, Records, T Results / Process Orders est []  - 0 Staff telephones HHA, Nursing Homes / Clarify orders / etc []  - 0 Routine Transfer to another Facility (non-emergent condition) []  - 0 Routine Hospital Admission (non-emergent condition) []  - 0 New Admissions / Biomedical engineer / Ordering NPWT Apligraf, etc. , []  - 0 Emergency Hospital Admission (emergent condition) X- 1 10 Simple Discharge Coordination []  - 0 Complex (extensive) Discharge Coordination PROCESS - Special Needs []  - 0 Pediatric / Minor Patient Management []  - 0 Isolation Patient Management []  - 0 Hearing / Language / Visual special needs []  - 0 Assessment of Community assistance (transportation, D/C planning, etc.) []  - 0 Additional assistance / Altered  mentation []  - 0 Support Surface(s) Assessment (bed, cushion, seat, etc.) INTERVENTIONS - Wound Cleansing / Measurement []  - 0 Simple Wound Cleansing - one  wound X- 2 5 Complex Wound Cleansing - multiple wounds []  - 0 Wound Imaging (photographs - any number of wounds) []  - 0 Wound Tracing (instead of photographs) []  - 0 Simple Wound Measurement - one wound []  - 0 Complex Wound Measurement - multiple wounds INTERVENTIONS - Wound Dressings X - Small Wound Dressing one or multiple wounds 2 10 []  - 0 Medium Wound Dressing one or multiple wounds []  - 0 Large Wound Dressing one or multiple wounds []  - 0 Application of Medications - topical []  - 0 Application of Medications - injection INTERVENTIONS - Miscellaneous []  - 0 External ear exam []  - 0 Specimen Collection (cultures, biopsies, blood, body fluids, etc.) []  - 0 Specimen(s) / Culture(s) sent or taken to Lab for analysis []  - 0 Patient Transfer (multiple staff / Civil Service fast streamer / Similar devices) []  - 0 Simple Staple / Suture removal (25 or less) []  - 0 Complex Staple / Suture removal (26 or more) []  - 0 Hypo / Hyperglycemic Management (close monitor of Blood Glucose) []  - 0 Ankle / Brachial Index (ABI) - do not check if billed separately X- 1 5 Vital Signs Has the patient been seen at the hospital within the last three years: Yes Total Score: 90 Level Of Care: New/Established - Level 3 Electronic Signature(s) Signed: 10/12/2019 5:57:30 PM By: Levan Hurst RN, BSN Entered By: Levan Hurst on 10/11/2019 10:15:17 -------------------------------------------------------------------------------- Encounter Discharge Information Details Patient Name: Date of Service: [name], [name] K. 10/11/2019 9:15 A M Medical Record Number: [id number] Patient Account Number: [id number] Date of Birth/Sex: Treating RN: [birth year] (54 y.o. Male) Levan Hurst Primary Care Kaari Zeigler: Geryl Rankins Other Clinician: Referring  Camara Renstrom: Treating Schneur Crowson/Extender: Lenetta Quaker in Treatment: 0 Encounter Discharge Information Items Discharge Condition: Stable Ambulatory Status: Walker Discharge Destination: Home Transportation: Private Auto Accompanied By: alone Schedule Follow-up Appointment: Yes Clinical Summary of Care: Patient Declined Electronic Signature(s) Signed: 10/12/2019 5:57:30 PM By: Levan Hurst RN, BSN Entered By: Levan Hurst on 10/11/2019 10:16:15 -------------------------------------------------------------------------------- Wound Assessment Details Patient Name: Date of Service: Velora Heckler K. 10/11/2019 9:15 A M Medical Record Number: [id number] Patient Account Number: [id number] Date of Birth/Sex: Treating RN: 07-Oct-1965 (54 y.o. Male) Levan Hurst Primary Care Fuad Forget: Geryl Rankins Other Clinician: Referring Kalid Ghan: Treating Marvion Bastidas/Extender: Tomasa Blase, Army Melia Weeks in Treatment: 0 Wound Status Wound Number: 1 Primary Etiology: Diabetic Wound/Ulcer of the Lower Extremity Wound Location: Right, Medial Foot Wound Status: Open Wounding Event: Blister Comorbid Hypertension, Peripheral Venous Disease, Type II History: Diabetes Date Acquired: 08/29/2019 Weeks Of Treatment: 0 Clustered Wound: No Wound Measurements Length: (cm) 1 Width: (cm) 1.7 Depth: (cm) 0.2 Area: (cm) 1.335 Volume: (cm) 0.267 Wound Description Classification: Grade 2 Wound Margin: Flat and Intact Exudate Amount: Medium Exudate Type: Serosanguineous Exudate Color: red, brown Foul Odor After Cleansing: N Slough/Fibrino Y % Reduction in Area: 0% % Reduction in Volume: 0% Epithelialization: None Tunneling: No Undermining: No o es Wound Bed Granulation Amount: Small (1-33%) Exposed Structure Granulation Quality: Pink Fascia Exposed: No Necrotic Amount: Large (67-100%) Fat Layer (Subcutaneous Tissue) Exposed: Yes Necrotic Quality: Adherent  Slough Tendon Exposed: No Muscle Exposed: No Joint Exposed: No Bone Exposed: No Treatment Notes Wound #1 (Right, Medial Foot) 1. Cleanse With Wound Cleanser 3. Primary Dressing Applied [employer] 4. Secondary Dressing Foam Border Dressing Electronic Signature(s) Signed: 10/12/2019 5:57:30 PM By: Levan Hurst RN, BSN Entered By: Levan Hurst on 10/11/2019 10:14:28 -------------------------------------------------------------------------------- Wound Assessment Details Patient Name: Date of Service: [name],  [name] N K. 10/11/2019 9:15 A M Medical Record Number: [id number] Patient Account Number: [id number] Date of Birth/Sex: Treating RN: Dec 18, 1965 (54 y.o. Male) Levan Hurst Primary Care Nachman Sundt: Geryl Rankins Other Clinician: Referring Nichele Slawson: Treating Hebert Dooling/Extender: Tomasa Blase, Paulette Blanch in Treatment: 0 Wound Status Wound Number: 2 Primary Etiology: Diabetic Wound/Ulcer of the Lower Extremity Wound Location: Left, Medial Foot Wound Status: Open Wounding Event: Blister Comorbid Hypertension, Peripheral Venous Disease, Type II History: Diabetes Date Acquired: 08/29/2019 Weeks Of Treatment: 0 Clustered Wound: No Wound Measurements Length: (cm) 0.6 Width: (cm) 1.6 Depth: (cm) 0.2 Area: (cm) 0.754 Volume: (cm) 0.151 % Reduction in Area: 0% % Reduction in Volume: 0% Epithelialization: None Tunneling: No Undermining: No Wound Description Classification: Grade 2 Wound Margin: Flat and Intact Exudate Amount: Medium Exudate Type: Serosanguineous Exudate Color: red, brown Foul Odor After Cleansing: No Slough/Fibrino Yes Wound Bed Granulation Amount: Large (67-100%) Exposed Structure Granulation Quality: Red Fascia Exposed: No Necrotic Amount: Small (1-33%) Fat Layer (Subcutaneous Tissue) Exposed: Yes Necrotic Quality: Eschar, Adherent Slough Tendon Exposed: No Muscle Exposed: No Joint Exposed: No Bone Exposed: No Treatment  Notes Wound #2 (Left, Medial Foot) 1. Cleanse With Wound Cleanser 3. Primary Dressing Applied [employer] 4. Secondary Dressing Foam Border Dressing Electronic Signature(s) Signed: 10/12/2019 5:57:30 PM By: Levan Hurst RN, BSN Entered By: Levan Hurst on 10/11/2019 10:14:42 -------------------------------------------------------------------------------- Vitals Details Patient Name: Date of Service: [name], [name] K. 10/11/2019 9:15 A M Medical Record Number: [id number] Patient Account Number: [id number] Date of Birth/Sex: Treating RN: 11-08-1965 (54 y.o. Male) Levan Hurst Primary Care Jalon Squier: Geryl Rankins Other Clinician: Referring Craig Wisnewski: Treating Robertson Colclough/Extender: Tomasa Blase, Paulette Blanch in Treatment: 0 Vital Signs Time Taken: 10:05 Temperature (F): 98.5 Height (in): 68 Pulse (bpm): 98 Weight (lbs): 185 Respiratory Rate (breaths/min): 18 Body Mass Index (BMI): 28.1 Blood Pressure (mmHg): 105/68 Reference Range: 80 - 120 mg / dl Electronic Signature(s) Signed: 10/12/2019 5:57:30 PM By: Levan Hurst RN, BSN Entered By: Levan Hurst on 10/11/2019 10:06:02

## 2019-10-12 NOTE — Progress Notes (Signed)
RITHIK, ODEA ([id number]) Visit Report for 10/11/2019 SuperBill Details Patient Name: Date of Service: Clint Lipps 10/11/2019 Medical Record Number: [id number] Patient Account Number: [id number] Date of Birth/Sex: Treating RN: 12-03-1965 (54 y.o. 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

## 2019-10-14 ENCOUNTER — Encounter (HOSPITAL_BASED_OUTPATIENT_CLINIC_OR_DEPARTMENT_OTHER): Payer: Medicaid Other | Admitting: Internal Medicine

## 2019-10-14 ENCOUNTER — Other Ambulatory Visit: Payer: Self-pay

## 2019-10-14 DIAGNOSIS — E11621 Type 2 diabetes mellitus with foot ulcer: Secondary | ICD-10-CM | POA: Diagnosis not present

## 2019-10-17 NOTE — Progress Notes (Signed)
Eric Morton ([id number]) Visit Report for 10/14/2019 Arrival Information Details Patient Name: Date of Service: Eric Morton 10/14/2019 1:45 PM Medical Record Number: [id number] Patient Account Number: [id number] Date of Birth/Sex: Treating RN: [birth year] (54 y.o. Eric Morton Primary Care Eric Morton: Eric Morton Other Clinician: Referring Eric Morton: Treating Eric Morton/Extender: Eric Morton in Treatment: 1 Visit Information History Since Last Visit Added or deleted any medications: No Patient Arrived: Wheel Chair Any new allergies or adverse reactions: No Arrival Time: 14:34 Had a fall or experienced change in No Accompanied By: self activities of daily living that may affect Transfer Assistance: None risk of falls: Patient Identification Verified: Yes Signs or symptoms of abuse/neglect since last visito No Secondary Verification Process Completed: Yes Hospitalized since last visit: No Patient Requires Transmission-Based Precautions: No Implantable device outside of the clinic excluding No Patient Has Alerts: Yes cellular tissue based products placed in the center Patient Alerts: L ABI 1.0 since last visit: R. ABI .90 Has Dressing in Place as Prescribed: Yes Pain Present Now: Yes Electronic Signature(s) Signed: 10/17/2019 8:02:18 AM By: Eric Morton Entered By: Eric Morton on 10/14/2019 14:36:14 -------------------------------------------------------------------------------- Encounter Discharge Information Details Patient Name: Date of Service: Eric Morton, Eric Morton. 10/14/2019 1:45 PM Medical Record Number: [id number] Patient Account Number: [id number] Date of Birth/Sex: Treating RN: [birth year] (54 y.o. Eric Morton Primary Care Vickki Igou: Eric Morton Other Clinician: Referring Ryelan Kazee: Treating Brevon Dewald/Extender: Eric Morton in Treatment: 1 Encounter Discharge Information Items Post Procedure  Vitals Discharge Condition: Stable Temperature (F): 98.3 Ambulatory Status: Wheelchair Pulse (bpm): 92 Discharge Destination: Home Respiratory Rate (breaths/min): 18 Transportation: Private Auto Blood Pressure (mmHg): 99/66 Accompanied By: alone Schedule Follow-up Appointment: Yes Clinical Summary of Care: Patient Declined Electronic Signature(s) Signed: 10/17/2019 5:09:36 PM By: Eric Hurst RN, BSN Entered By: Eric Morton on 10/14/2019 16:20:48 -------------------------------------------------------------------------------- Lower Extremity Assessment Details Patient Name: Date of Service: Eric Morton. 10/14/2019 1:45 PM Medical Record Number: [id number] Patient Account Number: [id number] Date of Birth/Sex: Treating RN: Eric Morton (54 y.o. Eric Morton Primary Care Madelynne Lasker: Eric Morton Other Clinician: Referring Salaya Holtrop: Treating Jamesen Stahnke/Extender: Tomasa Blase, Paulette Blanch in Treatment: 1 Edema Assessment Assessed: [Left: No] [Right: No] Edema: [Left: No] [Right: No] Calf Left: Right: Point of Measurement: 39 cm From Medial Instep 36 cm 35 cm Ankle Left: Right: Point of Measurement: 8 cm From Medial Instep 24 cm 23 cm Vascular Assessment Pulses: Dorsalis Pedis Palpable: [Left:Yes] [Right:Yes] Electronic Signature(s) Signed: 10/17/2019 5:09:36 PM By: Eric Hurst RN, BSN Entered By: Eric Morton on 10/14/2019 14:55:33 -------------------------------------------------------------------------------- Multi Wound Chart Details Patient Name: Date of Service: Eric Morton, Eric Morton. 10/14/2019 1:45 PM Medical Record Number: [id number] Patient Account Number: [id number] Date of Birth/Sex: Treating RN: [birth year] (54 y.o. Eric Morton Primary Care Reon Hunley: Eric Morton Other Clinician: Referring Eulan Heyward: Treating Dadrian Ballantine/Extender: Tomasa Blase, Paulette Blanch in Treatment: 1 Vital Signs Height(in): 68 Pulse(bpm):  56 Weight(lbs): 185 Blood Pressure(mmHg): 99/66 Body Mass Index(BMI): 28 Temperature(F): 98.3 Respiratory Rate(breaths/min): 18 Photos: [1:No Photos Right, Medial Foot] [2:No Photos Left, Medial Foot] [N/A:N/A N/A] Wound Location: [1:Blister] [2:Blister] [N/A:N/A] Wounding Event: [1:Diabetic Wound/Ulcer of the Lower] [2:Diabetic Wound/Ulcer of the Lower] [N/A:N/A] Primary Etiology: [1:Extremity Hypertension, Peripheral Venous] [2:Extremity Hypertension, Peripheral Venous] [N/A:N/A] Comorbid History: [1:Disease, Type II Diabetes 08/29/2019] [2:Disease, Type II Diabetes 08/29/2019] [N/A:N/A] Date Acquired: [1:1] [2:1] [N/A:N/A] Weeks of Treatment: [1:Open] [2:Open] [N/A:N/A] Wound Status: [1:1x1.8x0.2] [2:0.5x1.5x0.1] [N/A:N/A] Measurements L x W x D (cm) [1:1.414] [2:0.589] [  N/A:N/A] A (cm) : rea [1:0.283] [2:0.059] [N/A:N/A] Volume (cm) : [1:-5.90%] [2:21.90%] [N/A:N/A] % Reduction in A rea: [1:-6.00%] [2:60.90%] [N/A:N/A] % Reduction in Volume: [1:Grade 2] [2:Grade 2] [N/A:N/A] Classification: [1:Medium] [2:Medium] [N/A:N/A] Exudate A mount: [1:Serosanguineous] [2:Serosanguineous] [N/A:N/A] Exudate Type: [1:red, brown] [2:red, brown] [N/A:N/A] Exudate Color: [1:Flat and Intact] [2:Flat and Intact] [N/A:N/A] Wound Margin: [1:None Present (0%)] [2:Large (67-100%)] [N/A:N/A] Granulation A mount: [1:N/A] [2:Red] [N/A:N/A] Granulation Quality: [1:Large (67-100%)] [2:Small (1-33%)] [N/A:N/A] Necrotic A mount: [1:Fat Layer (Subcutaneous Tissue): Yes Fat Layer (Subcutaneous Tissue): Yes N/A] Exposed Structures: [1:Fascia: No Tendon: No Muscle: No Joint: No Bone: No None] [2:Fascia: No Tendon: No Muscle: No Joint: No Bone: No Small (1-33%)] [N/A:N/A] Epithelialization: [1:Debridement - Excisional] [2:N/A] [N/A:N/A] Debridement: Pre-procedure Verification/Time Out 15:34 [2:N/A] [N/A:N/A] Taken: [1:Other] [2:N/A] [N/A:N/A] Pain Control: [1:Subcutaneous, Slough] [2:N/A] [N/A:N/A] Tissue  Debrided: [1:Skin/Subcutaneous Tissue] [2:N/A] [N/A:N/A] Level: [1:1.8] [2:N/A] [N/A:N/A] Debridement A (sq cm): [1:rea Curette] [2:N/A] [N/A:N/A] Instrument: [1:Minimum] [2:N/A] [N/A:N/A] Bleeding: [1:Pressure] [2:N/A] [N/A:N/A] Hemostasis A chieved: [1:0] [2:N/A] [N/A:N/A] Procedural Pain: [1:0] [2:N/A] [N/A:N/A] Post Procedural Pain: [1:Procedure was tolerated well] [2:N/A] [N/A:N/A] Debridement Treatment Response: [1:1x1.8x0.2] [2:N/A] [N/A:N/A] Post Debridement Measurements L x W x D (cm) [1:0.283] [2:N/A] [N/A:N/A] Post Debridement Volume: (cm) [1:Debridement] [2:N/A] [N/A:N/A] Treatment Notes Wound #1 (Right, Medial Foot) 1. Cleanse With Wound Cleanser 3. Primary Dressing Applied Colle[employer] 4. Secondary Dressing Foam Border Dressing Wound #2 (Left, Medial Foot) 1. Cleanse With Wound Cleanser 3. Primary Dressing Applied [employer] 4. Secondary Dressing Foam Border Dressing Electronic Signature(s) Signed: 10/17/2019 8:08:43 AM By: Linton Ham MD Signed: 10/17/2019 1:33:30 PM By: Kela Millin Entered By: Linton Ham on 10/16/2019 07:36:24 -------------------------------------------------------------------------------- Multi-Disciplinary Care Plan Details Patient Name: Date of Service: Eric Morton, Eric Morton 10/14/2019 1:45 PM Medical Record Number: [id number] Patient Account Number: [id number] Date of Birth/Sex: Treating RN: [birth year] Morton (54 y.o. Eric Morton Primary Care Vikas Wegmann: Other Clinician: Geryl Morton Referring Malaquias Lenker: Treating Halbert Jesson/Extender: Eric Morton in Treatment: 1 Active Inactive Nutrition Nursing Diagnoses: Impaired glucose control: actual or potential Goals: Patient/caregiver verbalizes understanding of need to maintain therapeutic glucose control per primary care physician Date Initiated: 10/07/2019 Target Resolution Date: 11/04/2019 Goal Status: Active Interventions: Provide education on  elevated blood sugars and impact on wound healing Notes: Wound/Skin Impairment Nursing Diagnoses: Impaired tissue integrity Goals: Ulcer/skin breakdown will have a volume reduction of 50% by week 8 Date Initiated: 10/07/2019 Target Resolution Date: 11/04/2019 Goal Status: Active Interventions: Provide education on ulcer and skin care Notes: Electronic Signature(s) Signed: 10/17/2019 1:33:30 PM By: Kela Millin Entered By: Kela Millin on 10/14/2019 14:34:40 -------------------------------------------------------------------------------- Pain Assessment Details Patient Name: Date of Service: Eric Morton 10/14/2019 1:45 PM Medical Record Number: [id number] Patient Account Number: [id number] Date of Birth/Sex: Treating RN: [birth year] (54 y.o. Eric Morton Primary Care Zamarah Ullmer: Eric Morton Other Clinician: Referring Janeil Schexnayder: Treating Jayvier Burgher/Extender: Eric Morton in Treatment: 1 Active Problems Location of Pain Severity and Description of Pain Patient Has Paino Yes Site Locations Rate the pain. Rate the pain. Current Pain Level: 6 Pain Management and Medication Current Pain Management: Electronic Signature(s) Signed: 10/17/2019 8:02:18 AM By: Eric Morton Signed: 10/17/2019 1:33:30 PM By: Kela Millin Entered By: Eric Morton on 10/14/2019 14:36:41 -------------------------------------------------------------------------------- Patient/Caregiver Education Details Patient Name: Date of Service: Eric Morton 9/17/2021andnbsp1:45 PM Medical Record Number: [id number] Patient Account Number: [id number] Date of Birth/Gender: Treating RN: [birth year] (54 y.o. Eric Morton Primary Care Physician: Eric Morton Other Clinician: Referring Physician: Treating  Physician/Extender: Eric Morton in Treatment: 1 Education Assessment Education Provided To: Patient Education Topics  Provided Wound/Skin Impairment: Handouts: Caring for Your Ulcer Methods: Explain/Verbal Responses: State content correctly Electronic Signature(s) Signed: 10/17/2019 1:33:30 PM By: Kela Millin Entered By: Kela Millin on 10/14/2019 14:34:57 -------------------------------------------------------------------------------- Wound Assessment Details Patient Name: Date of Service: Eric Morton. 10/14/2019 1:45 PM Medical Record Number: [id number] Patient Account Number: [id number] Date of Birth/Sex: Treating RN: [birth year] Morton (54 y.o. Eric Morton Primary Care Marley Charlot: Eric Morton Other Clinician: Referring Aneya Daddona: Treating Hurley Blevins/Extender: Tomasa Blase, Paulette Blanch in Treatment: 1 Wound Status Wound Number: 1 Primary Etiology: Diabetic Wound/Ulcer of the Lower Extremity Wound Location: Right, Medial Foot Wound Status: Open Wounding Event: Blister Comorbid History: Hypertension, Peripheral Venous Disease, Type II Diabetes Date Acquired: 08/29/2019 Weeks Of Treatment: 1 Clustered Wound: No Wound Measurements Length: (cm) 1 Width: (cm) 1.8 Depth: (cm) 0.2 Area: (cm) 1.414 Volume: (cm) 0.283 % Reduction in Area: -5.9% % Reduction in Volume: -6% Epithelialization: None Tunneling: No Undermining: No Wound Description Classification: Grade 2 Wound Margin: Flat and Intact Exudate Amount: Medium Exudate Type: Serosanguineous Exudate Color: red, brown Foul Odor After Cleansing: No Slough/Fibrino Yes Wound Bed Granulation Amount: None Present (0%) Exposed Structure Necrotic Amount: Large (67-100%) Fascia Exposed: No Necrotic Quality: Adherent Slough Fat Layer (Subcutaneous Tissue) Exposed: Yes Tendon Exposed: No Muscle Exposed: No Joint Exposed: No Bone Exposed: No Treatment Notes Wound #1 (Right, Medial Foot) 1. Cleanse With Wound Cleanser 3. Primary Dressing Applied Collegen [employer] 4. Secondary Dressing Foam Border  Dressing Electronic Signature(s) Signed: 10/17/2019 1:33:30 PM By: Kela Millin Signed: 10/17/2019 5:09:36 PM By: Eric Hurst RN, BSN Entered By: Eric Morton on 10/14/2019 14:55:52 -------------------------------------------------------------------------------- Wound Assessment Details Patient Name: Date of Service: Eric Morton. 10/14/2019 1:45 PM Medical Record Number: [id number] Patient Account Number: [id number] Date of Birth/Sex: Treating RN: [birth year] (54 y.o. Eric Morton Primary Care Kaitelyn Jamison: Eric Morton Other Clinician: Referring Omnia Dollinger: Treating Tariyah Pendry/Extender: Tomasa Blase, Paulette Blanch in Treatment: 1 Wound Status Wound Number: 2 Primary Etiology: Diabetic Wound/Ulcer of the Lower Extremity Wound Location: Left, Medial Foot Wound Status: Open Wounding Event: Blister Comorbid History: Hypertension, Peripheral Venous Disease, Type II Diabetes Date Acquired: 08/29/2019 Weeks Of Treatment: 1 Clustered Wound: No Wound Measurements Length: (cm) 0.5 Width: (cm) 1.5 Depth: (cm) 0.1 Area: (cm) 0.589 Volume: (cm) 0.059 % Reduction in Area: 21.9% % Reduction in Volume: 60.9% Epithelialization: Small (1-33%) Tunneling: No Undermining: No Wound Description Classification: Grade 2 Wound Margin: Flat and Intact Exudate Amount: Medium Exudate Type: Serosanguineous Exudate Color: red, brown Foul Odor After Cleansing: No Slough/Fibrino Yes Wound Bed Granulation Amount: Large (67-100%) Exposed Structure Granulation Quality: Red Fascia Exposed: No Necrotic Amount: Small (1-33%) Fat Layer (Subcutaneous Tissue) Exposed: Yes Necrotic Quality: Adherent Slough Tendon Exposed: No Muscle Exposed: No Joint Exposed: No Bone Exposed: No Treatment Notes Wound #2 (Left, Medial Foot) 1. Cleanse With Wound Cleanser 3. Primary Dressing Applied [employer]le[employer] 4. Secondary Dressing Foam Border Dressing Electronic Signature(s) Signed:  10/17/2019 1:33:30 PM By: Kela Millin Signed: 10/17/2019 5:09:36 PM By: Eric Hurst RN, BSN Entered By: Eric Morton on 10/14/2019 14:56:13 -------------------------------------------------------------------------------- [place] Details Patient Name: Date of Service: Eric Morton, Eric Morton. 10/14/2019 1:45 PM Medical Record Number: [id number] Patient Account Number: [id number] Date of Birth/Sex: Treating RN: [birth year] Morton (54 y.o. Eric Morton Primary Care Alonni Heimsoth: Eric Morton Other Clinician: Referring Jhonathan Desroches: Treating Gwendola Hornaday/Extender: Tomasa Blase, Paulette Blanch in Treatment: 1 Vital Signs Time  Taken: 14:36 Temperature (F): 98.3 Height (in): 68 Pulse (bpm): 92 Weight (lbs): 185 Respiratory Rate (breaths/min): 18 Body Mass Index (BMI): 28.1 Blood Pressure (mmHg): 99/66 Reference Range: 80 - 120 mg / dl Electronic Signature(s) Signed: 10/17/2019 8:02:18 AM By: Eric Morton Entered By: Eric Morton on 10/14/2019 14:36:30

## 2019-10-17 NOTE — Progress Notes (Signed)
BRANDONN, CAPELLI ([id number]) Visit Report for 10/14/2019 Debridement Details Patient Name: Date of Service: Eric Morton 10/14/2019 1:45 PM Medical Record Number: [id number] Patient Account Number: [id number] Date of Birth/Sex: Treating RN: March 20, 1965 (54 y.o. [name] Primary Care Provider: Geryl Rankins Other Clinician: Referring Provider: Treating Provider/Extender: Lenetta Quaker in Treatment: 1 Debridement Performed for Assessment: Wound #1 Right,Medial Foot Performed By: Physician Ricard Dillon., MD Debridement Type: Debridement Severity of Tissue Pre Debridement: Fat layer exposed Level of Consciousness (Pre-procedure): Awake and Alert Pre-procedure Verification/Time Out Yes - 15:34 Taken: Start Time: 15:34 Pain Control: Other : benzocaine, 20% T Area Debrided (L x W): otal 1 (cm) x 1.8 (cm) = 1.8 (cm) Tissue and other material debrided: Viable, Non-Viable, Slough, Subcutaneous, Slough Level: Skin/Subcutaneous Tissue Debridement Description: Excisional Instrument: Curette Bleeding: Minimum Hemostasis Achieved: Pressure End Time: 15:35 Procedural Pain: 0 Post Procedural Pain: 0 Response to Treatment: Procedure was tolerated well Level of Consciousness (Post- Awake and Alert procedure): Post Debridement Measurements of Total Wound Length: (cm) 1 Width: (cm) 1.8 Depth: (cm) 0.2 Volume: (cm) 0.283 Character of Wound/Ulcer Post Debridement: Requires Further Debridement Severity of Tissue Post Debridement: Fat layer exposed Post Procedure Diagnosis Same as Pre-procedure Electronic Signature(s) Signed: 10/17/2019 8:08:43 AM By: Linton Ham MD Signed: 10/17/2019 1:33:30 PM By: Kela Millin Entered By: Linton Ham on 10/16/2019 07:40:22 -------------------------------------------------------------------------------- HPI Details Patient Name: Date of Service: [name], [name] K. 10/14/2019 1:45 PM Medical Record  Number: [id number] Patient Account Number: [id number] Date of Birth/Sex: Treating RN: 07/19/1965 (54 y.o. [name] Primary Care Provider: Geryl Rankins Other Clinician: Referring Provider: Treating Provider/Extender: Tomasa Blase, Paulette Blanch in Treatment: 1 History of Present Illness HPI Description: ADMISSION 10/07/2019 This is a 54 year old man who lives at [facility]. I am not really familiar with this facility. As I understand things he [facility] be [facility] of [facility] however he was removed at that time he moved into this facility where he is now. Patient is able to give his history fairly clearly. He states that he has had wounds on his bilateral medial first met heads for about a month and a half. I am not clear who is dressing these and if they are what is being put on. He does have a history of bilateral lower extremity wounds including the left medial first met head and right second toe. He had several appointments with Dr. Doren Custard of vein and vascular including March 2020 at which time his arterial studies suggested some degree of infra inguinal obstruction bilaterally but he had toe pressures of 64 on the right and 59 on the left which was felt to be adequate for healing. When he last saw Barbarann Ehlers Dr. Doren Custard in January 2021 all his wounds were healed. He had an ABI on the right of 1 on the left of 0.9 TBI of 0.57 on the right and on the left at 0.64. Dr. Doren Custard recommended serial follow-up but I do not think he has been seen since January. Dr. Mee Hives notes suggested that if he had recurrence of wounds or symptoms he would need a carefully monitored angiogram because of chronic renal failure. I am not really clear under what circumstances the current wounds developed. Also not clear what they are dressing the wounds with. The patient does not give a history of pain either in the wounds or claudication type pain. He seems quite active on his  feet. Past medical history  includes type 2 diabetes with peripheral neuropathy, hypertension, hyperlipidemia, CVA in 2018 gastroesophageal reflux disease, stage III chronic renal failure. He is currently a ward of the state although I am not sure what the circumstances are here. We did not repeat arterial studies today but I had like to do that 9/17; patient admitted to the clinic last week with wounds on the right medial greater than left medial first metatarsal heads. This still not completely viable. The area on the right prior to debridement. His pedal pulses are palpable in the foot but certainly not vibrant. We've been using [employer]) Signed: 10/17/2019 8:08:43 AM By: Linton Ham MD Entered By: Linton Ham on 10/16/2019 07:42:27 -------------------------------------------------------------------------------- Physical Exam Details Patient Name: Date of Service: Eric Morton. 10/14/2019 1:45 PM Medical Record Number: [id number] Patient Account Number: [id number] Date of Birth/Sex: Treating RN: [birth year] (54 y.o. [name] Primary Care Provider: Geryl Rankins Other Clinician: Referring Provider: Treating Provider/Extender: Tomasa Blase, Paulette Blanch in Treatment: 1 Constitutional Sitting or standing Blood Pressure is within target range for patient.. Pulse regular and within target range for patient.[name] Respirations regular, non-labored and within target range.. Temperature is normal and within the target range for the patient.[name] Appears in no distress. Notes wound exam; not much change from last time. The area on the right still is completely nonviable surface similar to last week. Using a curet. I debrided tissue. Also some surface callus skin. on the left was not repeated. His dorsalis pedis pulses are slightly brisker on the left than the right. Popliteal pulses are 1+. I could not feel posterior tibial pulse. Electronic  Signature(s) Signed: 10/17/2019 8:08:43 AM By: Linton Ham MD Entered By: Linton Ham on 10/16/2019 07:45:01 -------------------------------------------------------------------------------- Physician Orders Details Patient Name: Date of Service: Eric Morton. 10/14/2019 1:45 PM Medical Record Number: [id number] Patient Account Number: [id number] Date of Birth/Sex: Treating RN: Apr 26, 1965 (54 y.o. 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 - Friday Return A Nurse Visit: - Tuesday Dressing Change Frequency Other: - Tuesday and Friday Wound Cleansing May shower and wash wound with soap and water. - when dressing is changed. DO NOT GET WET Primary Wound Dressing Silver Collagen - moisten with hydrogel Secondary Dressing Foam Border Electronic Signature(s) Signed: 10/17/2019 8:08:43 AM By: Linton Ham MD Signed: 10/17/2019 1:33:30 PM By: Kela Millin Entered By: Kela Millin on 10/14/2019 14:34:28 -------------------------------------------------------------------------------- Problem List Details Patient Name: Date of Service: [name], [name]. 10/14/2019 1:45 PM Medical Record Number: [id number] Patient Account Number: [id number] Date of Birth/Sex: Treating RN: 20-Aug-1965 (54 y.o. [name] Primary Care Provider:  Geryl Rankins Other Clinician: Referring Provider: Treating Provider/Extender: Lenetta Quaker in Treatment: 1 Active Problems ICD-10 Encounter Code Description Active Date MDM Diagnosis E11.621 Type 2 diabetes mellitus with foot ulcer 10/07/2019 No Yes L97.518 Non-pressure chronic ulcer of other part of right foot with other specified 10/07/2019 No Yes severity L97.522 Non-pressure chronic ulcer of other part  of left foot with fat layer exposed 10/07/2019 No Yes E11.51 Type 2 diabetes mellitus with diabetic peripheral angiopathy without gangrene 10/07/2019 No Yes E11.40 Type 2 diabetes mellitus with diabetic neuropathy, unspecified 10/07/2019 No Yes Inactive Problems Resolved Problems Electronic Signature(s) Signed: 10/17/2019 8:08:43 AM By: Linton Ham MD Entered By: Linton Ham on 10/16/2019 07:33:38 -------------------------------------------------------------------------------- Progress Note Details Patient Name: Date of Service: [name], [name]. 10/14/2019 1:45 PM Medical Record Number: [id number] Patient Account Number: [id number] Date of Birth/Sex: Treating RN: 12-10-1965 (54 y.o. [name] Primary Care Provider: Geryl Rankins Other Clinician: Referring Provider: Treating Provider/Extender: Tomasa Blase, Paulette Blanch in Treatment: 1 Subjective History of Present Illness (HPI) ADMISSION 10/07/2019 This is a 55 year old man who lives at [facility]. I am not really familiar with this facility. As I understand things he used to be part of the [facility] however he was removed at that time he moved into this facility where he is now. Patient is able to give his history fairly clearly. He states that he has had wounds on his bilateral medial first met heads for about a month and a half. I am not clear who is dressing these and if they are what is being put on. He does have a history of bilateral lower  extremity wounds including the left medial first met head and right second toe. He had several appointments with Dr. Doren Custard of vein and vascular including March 2020 at which time his arterial studies suggested some degree of infra inguinal obstruction bilaterally but he had toe pressures of 64 on the right and 59 on the left which was felt to be adequate for healing. When he last saw Barbarann Ehlers Dr. Doren Custard in January 2021 all his wounds were healed. He had an ABI on the right of 1 on the left of 0.9 TBI of 0.57 on the right and on the left at 0.64. Dr. Doren Custard recommended serial follow-up but I do not think he has been seen since January. Dr. Mee Hives notes suggested that if he had recurrence of wounds or symptoms he would need a carefully monitored angiogram because of chronic renal failure. I am not really clear under what circumstances the current wounds developed. Also not clear what they are dressing the wounds with. The patient does not give a history of pain either in the wounds or claudication type pain. He seems quite active on his feet. Past medical history includes type 2 diabetes with peripheral neuropathy, hypertension, hyperlipidemia, CVA in 2018 gastroesophageal reflux disease, stage III chronic renal failure. He is currently a ward of the state although I am not sure what the circumstances are here. We did not repeat arterial studies today but I had like to do that 9/17; patient admitted to the clinic last week with wounds on the right medial greater than left medial first metatarsal heads. This still not completely viable. The area on the right prior to debridement. His pedal pulses are palpable in the foot but certainly not vibrant. We've been using [employer] Constitutional Sitting or standing Blood Pressure is within target range for patient.. Pulse regular and within target range for patient.[name] Respirations regular, non-labored and within target range.. Temperature is normal and within  the target range for the patient.[name] Appears in no distress. Vitals Time Taken: 2:36 PM, Height: 68 in, Weight: 185 lbs, BMI: 28.1, Temperature: 98.3 F, Pulse: 92 bpm, Respiratory Rate: 18 breaths/min, Blood Pressure: 99/66 mmHg. General Notes: wound exam; not much change  from last time. The area on the right still is completely nonviable surface similar to last week. Using a curet. I debrided tissue. Also some surface callus skin. on the left was not repeated. His dorsalis pedis pulses are slightly brisker on the left than the right. Popliteal pulses are 1+. I could not feel posterior tibial pulse. Integumentary (Hair, Skin) Wound #1 status is Open. Original cause of wound was Blister. The wound is located on the Right,Medial Foot. The wound measures 1cm length x 1.8cm width x 0.2cm depth; 1.414cm^2 area and 0.283cm^3 volume. There is Fat Layer (Subcutaneous Tissue) exposed. There is no tunneling or undermining noted. There is a medium amount of serosanguineous drainage noted. The wound margin is flat and intact. There is no granulation within the wound bed. There is a large (67- 100%) amount of necrotic tissue within the wound bed including Adherent Slough. Wound #2 status is Open. Original cause of wound was Blister. The wound is located on the Left,Medial Foot. The wound measures 0.5cm length x 1.5cm width x 0.1cm depth; 0.589cm^2 area and 0.059cm^3 volume. There is Fat Layer (Subcutaneous Tissue) exposed. There is no tunneling or undermining noted. There is a medium amount of serosanguineous drainage noted. The wound margin is flat and intact. There is large (67-100%) red granulation within the wound bed. There is a small (1-33%) amount of necrotic tissue within the wound bed including Adherent Slough. 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 There was a Excisional Skin/Subcutaneous Tissue Debridement with a total area of 1.8 sq cm performed by Ricard Dillon., MD. With the following instrument(s): Curette to remove Viable and Non-Viable tissue/material. Material removed includes Subcutaneous Tissue and Slough and after achieving pain control using Other (benzocaine, 20%). No specimens were taken. A time out was conducted at 15:34, prior to the start of the procedure. A Minimum amount of bleeding was controlled with Pressure. The procedure was tolerated well with a pain level of 0 throughout and a pain level of 0 following the procedure. Post Debridement Measurements: 1cm length x 1.8cm width x 0.2cm depth; 0.283cm^3 volume. Character of Wound/Ulcer Post Debridement requires further debridement. Severity of Tissue Post Debridement is: Fat layer exposed. Post procedure Diagnosis Wound #1: Same as Pre-Procedure Plan Follow-up Appointments: Return Appointment in 1 week. - Friday Nurse Visit: - Tuesday Dressing Change Frequency: Other: - Tuesday and Friday Wound Cleansing: May shower and wash wound with soap and water. - when dressing is changed. DO NOT GET WET Primary Wound Dressing: Silver Collagen - moisten with hydrogel Secondary Dressing: Foam Border #1 I continued with silver collagen both areas #2 the patient is likely going to need to revisit renovascular [Dr. Dixon]. I would like to repeat his ABIs.covering this with the border foam dressing #3 the time of this dictation I'm really not certain who is changing this. Presumably he has home  health because an assisted living or independent living facility wouldn't necessarily have a nurse to do this Electronic Signature(s) Signed: 10/17/2019 8:08:43 AM By: Linton Ham MD Entered By: Dellia Nims,  Lear Carstens on 10/16/2019 07:46:52 -------------------------------------------------------------------------------- SuperBill Details Patient Name: Date of Service: Eric Morton 10/14/2019 Medical Record Number: [id number] Patient Account Number: [id number] Date of Birth/Sex: Treating RN: [birth year] (54 y.o. 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 E11.51 Type 2 diabetes mellitus with diabetic peripheral angiopathy without gangrene E11.40 Type 2 diabetes mellitus with diabetic neuropathy, unspecified Facility Procedures CPT4 Code: [id number] Description: 07619 - DEB SUBQ TISSUE 20 SQ CM/< ICD-10 Diagnosis Description L97.518 Non-pressure chronic ulcer of other part of right foot with other specified sev Modifier: erity Quantity: 1 Physician Procedures : CPT4 Code Description Modifier [id number] - WC PHYS SUBQ TISS 20 SQ CM ICD-10 Diagnosis Description L97.518 Non-pressure chronic ulcer of other part of right foot with other specified severity Quantity: 1 Electronic Signature(s) Signed: 10/17/2019 8:08:43 AM By: Linton Ham MD Entered By: Linton Ham on 10/16/2019 07:47:16

## 2019-10-18 ENCOUNTER — Encounter (HOSPITAL_BASED_OUTPATIENT_CLINIC_OR_DEPARTMENT_OTHER): Payer: Medicaid Other | Admitting: Internal Medicine

## 2019-10-20 ENCOUNTER — Other Ambulatory Visit: Payer: Self-pay

## 2019-10-20 ENCOUNTER — Inpatient Hospital Stay (HOSPITAL_COMMUNITY)
Admission: EM | Admit: 2019-10-20 | Discharge: 2019-10-26 | DRG: 638 | Disposition: A | Discharge status: Skilled Nursing Facility | Payer: Medicaid Other | Source: Skilled Nursing Facility | Attending: Internal Medicine | Admitting: Internal Medicine

## 2019-10-20 DIAGNOSIS — Y778 Miscellaneous ophthalmic devices associated with adverse incidents, not elsewhere classified: Secondary | ICD-10-CM | POA: Diagnosis present

## 2019-10-20 DIAGNOSIS — N1832 Chronic kidney disease, stage 3b: Secondary | ICD-10-CM | POA: Diagnosis present

## 2019-10-20 DIAGNOSIS — Z888 Allergy status to other drugs, medicaments and biological substances status: Secondary | ICD-10-CM

## 2019-10-20 DIAGNOSIS — I10 Essential (primary) hypertension: Secondary | ICD-10-CM | POA: Diagnosis present

## 2019-10-20 DIAGNOSIS — E11621 Type 2 diabetes mellitus with foot ulcer: Principal | ICD-10-CM | POA: Diagnosis present

## 2019-10-20 DIAGNOSIS — M009 Pyogenic arthritis, unspecified: Secondary | ICD-10-CM | POA: Diagnosis present

## 2019-10-20 DIAGNOSIS — E1122 Type 2 diabetes mellitus with diabetic chronic kidney disease: Secondary | ICD-10-CM | POA: Diagnosis present

## 2019-10-20 DIAGNOSIS — D631 Anemia in chronic kidney disease: Secondary | ICD-10-CM | POA: Diagnosis present

## 2019-10-20 DIAGNOSIS — E7800 Pure hypercholesterolemia, unspecified: Secondary | ICD-10-CM | POA: Diagnosis present

## 2019-10-20 DIAGNOSIS — I129 Hypertensive chronic kidney disease with stage 1 through stage 4 chronic kidney disease, or unspecified chronic kidney disease: Secondary | ICD-10-CM | POA: Diagnosis present

## 2019-10-20 DIAGNOSIS — K219 Gastro-esophageal reflux disease without esophagitis: Secondary | ICD-10-CM | POA: Diagnosis present

## 2019-10-20 DIAGNOSIS — E1142 Type 2 diabetes mellitus with diabetic polyneuropathy: Secondary | ICD-10-CM | POA: Diagnosis present

## 2019-10-20 DIAGNOSIS — N3289 Other specified disorders of bladder: Secondary | ICD-10-CM | POA: Diagnosis present

## 2019-10-20 DIAGNOSIS — E785 Hyperlipidemia, unspecified: Secondary | ICD-10-CM | POA: Diagnosis present

## 2019-10-20 DIAGNOSIS — R338 Other retention of urine: Secondary | ICD-10-CM | POA: Diagnosis present

## 2019-10-20 DIAGNOSIS — Z8673 Personal history of transient ischemic attack (TIA), and cerebral infarction without residual deficits: Secondary | ICD-10-CM

## 2019-10-20 DIAGNOSIS — Z8249 Family history of ischemic heart disease and other diseases of the circulatory system: Secondary | ICD-10-CM

## 2019-10-20 DIAGNOSIS — N179 Acute kidney failure, unspecified: Secondary | ICD-10-CM | POA: Diagnosis present

## 2019-10-20 DIAGNOSIS — Z79899 Other long term (current) drug therapy: Secondary | ICD-10-CM

## 2019-10-20 DIAGNOSIS — E1169 Type 2 diabetes mellitus with other specified complication: Secondary | ICD-10-CM | POA: Diagnosis present

## 2019-10-20 DIAGNOSIS — I69328 Other speech and language deficits following cerebral infarction: Secondary | ICD-10-CM

## 2019-10-20 DIAGNOSIS — R339 Retention of urine, unspecified: Secondary | ICD-10-CM

## 2019-10-20 DIAGNOSIS — Z86010 Personal history of colonic polyps: Secondary | ICD-10-CM

## 2019-10-20 DIAGNOSIS — M869 Osteomyelitis, unspecified: Secondary | ICD-10-CM | POA: Diagnosis present

## 2019-10-20 DIAGNOSIS — N1330 Unspecified hydronephrosis: Secondary | ICD-10-CM

## 2019-10-20 DIAGNOSIS — E876 Hypokalemia: Secondary | ICD-10-CM | POA: Diagnosis present

## 2019-10-20 DIAGNOSIS — M86071 Acute hematogenous osteomyelitis, right ankle and foot: Secondary | ICD-10-CM | POA: Diagnosis present

## 2019-10-20 DIAGNOSIS — M8600 Acute hematogenous osteomyelitis, unspecified site: Secondary | ICD-10-CM

## 2019-10-20 DIAGNOSIS — F419 Anxiety disorder, unspecified: Secondary | ICD-10-CM | POA: Diagnosis present

## 2019-10-20 DIAGNOSIS — H109 Unspecified conjunctivitis: Secondary | ICD-10-CM | POA: Diagnosis present

## 2019-10-20 DIAGNOSIS — L97519 Non-pressure chronic ulcer of other part of right foot with unspecified severity: Secondary | ICD-10-CM | POA: Diagnosis present

## 2019-10-20 DIAGNOSIS — Z8711 Personal history of peptic ulcer disease: Secondary | ICD-10-CM

## 2019-10-20 DIAGNOSIS — E1165 Type 2 diabetes mellitus with hyperglycemia: Secondary | ICD-10-CM | POA: Diagnosis present

## 2019-10-20 DIAGNOSIS — H44009 Unspecified purulent endophthalmitis, unspecified eye: Secondary | ICD-10-CM | POA: Diagnosis present

## 2019-10-20 DIAGNOSIS — Z833 Family history of diabetes mellitus: Secondary | ICD-10-CM

## 2019-10-20 DIAGNOSIS — E870 Hyperosmolality and hypernatremia: Secondary | ICD-10-CM | POA: Diagnosis present

## 2019-10-20 DIAGNOSIS — N401 Enlarged prostate with lower urinary tract symptoms: Secondary | ICD-10-CM | POA: Diagnosis present

## 2019-10-20 DIAGNOSIS — H1089 Other conjunctivitis: Secondary | ICD-10-CM | POA: Diagnosis present

## 2019-10-20 DIAGNOSIS — I951 Orthostatic hypotension: Secondary | ICD-10-CM

## 2019-10-20 DIAGNOSIS — Y848 Other medical procedures as the cause of abnormal reaction of the patient, or of later complication, without mention of misadventure at the time of the procedure: Secondary | ICD-10-CM | POA: Diagnosis present

## 2019-10-20 DIAGNOSIS — R531 Weakness: Secondary | ICD-10-CM

## 2019-10-20 DIAGNOSIS — I69351 Hemiplegia and hemiparesis following cerebral infarction affecting right dominant side: Secondary | ICD-10-CM

## 2019-10-20 DIAGNOSIS — H16001 Unspecified corneal ulcer, right eye: Secondary | ICD-10-CM | POA: Diagnosis present

## 2019-10-20 DIAGNOSIS — Z7982 Long term (current) use of aspirin: Secondary | ICD-10-CM

## 2019-10-20 DIAGNOSIS — Z794 Long term (current) use of insulin: Secondary | ICD-10-CM

## 2019-10-20 DIAGNOSIS — Z20822 Contact with and (suspected) exposure to covid-19: Secondary | ICD-10-CM | POA: Diagnosis present

## 2019-10-20 DIAGNOSIS — H44001 Unspecified purulent endophthalmitis, right eye: Secondary | ICD-10-CM | POA: Diagnosis present

## 2019-10-20 DIAGNOSIS — Z87891 Personal history of nicotine dependence: Secondary | ICD-10-CM

## 2019-10-20 NOTE — ED Triage Notes (Signed)
Per EMS,  Pt is coming from [facility]. Pt called out today after standing up and feeling shaky, cold, and weak. Pt has not had anything to eat or drink since breakfest. Pt states he has not had a normal meal for 2x days. CBG on scene was 220, initial BP was 90/50. Pt given 530ml bolus of NS, and BP was 118/60.

## 2019-10-21 ENCOUNTER — Encounter (HOSPITAL_COMMUNITY): Payer: Self-pay | Admitting: Internal Medicine

## 2019-10-21 ENCOUNTER — Emergency Department (HOSPITAL_COMMUNITY): Payer: Medicaid Other

## 2019-10-21 ENCOUNTER — Other Ambulatory Visit: Payer: Self-pay

## 2019-10-21 ENCOUNTER — Encounter (HOSPITAL_BASED_OUTPATIENT_CLINIC_OR_DEPARTMENT_OTHER): Payer: Medicaid Other | Admitting: Internal Medicine

## 2019-10-21 ENCOUNTER — Observation Stay (HOSPITAL_COMMUNITY): Payer: Medicaid Other

## 2019-10-21 DIAGNOSIS — N1832 Chronic kidney disease, stage 3b: Secondary | ICD-10-CM | POA: Diagnosis not present

## 2019-10-21 DIAGNOSIS — R531 Weakness: Secondary | ICD-10-CM

## 2019-10-21 DIAGNOSIS — R338 Other retention of urine: Secondary | ICD-10-CM | POA: Diagnosis present

## 2019-10-21 DIAGNOSIS — L089 Local infection of the skin and subcutaneous tissue, unspecified: Secondary | ICD-10-CM

## 2019-10-21 DIAGNOSIS — H109 Unspecified conjunctivitis: Secondary | ICD-10-CM | POA: Diagnosis present

## 2019-10-21 DIAGNOSIS — K219 Gastro-esophageal reflux disease without esophagitis: Secondary | ICD-10-CM | POA: Diagnosis present

## 2019-10-21 DIAGNOSIS — E11628 Type 2 diabetes mellitus with other skin complications: Secondary | ICD-10-CM | POA: Diagnosis not present

## 2019-10-21 DIAGNOSIS — N179 Acute kidney failure, unspecified: Secondary | ICD-10-CM

## 2019-10-21 DIAGNOSIS — I10 Essential (primary) hypertension: Secondary | ICD-10-CM

## 2019-10-21 LAB — URINALYSIS, ROUTINE W REFLEX MICROSCOPIC
Bilirubin Urine: NEGATIVE
Glucose, UA: NEGATIVE mg/dL
Ketones, ur: NEGATIVE mg/dL
Leukocytes,Ua: NEGATIVE
Nitrite: NEGATIVE
Protein, ur: 30 mg/dL — AB
Specific Gravity, Urine: 1.006 (ref 1.005–1.030)
pH: 5 (ref 5.0–8.0)

## 2019-10-21 LAB — BLOOD GAS, VENOUS
Acid-base deficit: 7 mmol/L — ABNORMAL HIGH (ref 0.0–2.0)
Bicarbonate: 17.7 mmol/L — ABNORMAL LOW (ref 20.0–28.0)
O2 Saturation: 80.5 %
Patient temperature: 98.6
pCO2, Ven: 34.6 mmHg — ABNORMAL LOW (ref 44.0–60.0)
pH, Ven: 7.329 (ref 7.250–7.430)
pO2, Ven: 53.4 mmHg — ABNORMAL HIGH (ref 32.0–45.0)

## 2019-10-21 LAB — COMPREHENSIVE METABOLIC PANEL
ALT: 26 U/L (ref 0–44)
ALT: 21 U/L (ref 0–44)
AST: 38 U/L (ref 15–41)
AST: 24 U/L (ref 15–41)
Albumin: 3.1 g/dL — ABNORMAL LOW (ref 3.5–5.0)
Albumin: 2.6 g/dL — ABNORMAL LOW (ref 3.5–5.0)
Alkaline Phosphatase: 135 U/L — ABNORMAL HIGH (ref 38–126)
Alkaline Phosphatase: 118 U/L (ref 38–126)
Anion gap: 13 (ref 5–15)
Anion gap: 13 (ref 5–15)
BUN: 51 mg/dL — ABNORMAL HIGH (ref 6–20)
BUN: 44 mg/dL — ABNORMAL HIGH (ref 6–20)
CO2: 17 mmol/L — ABNORMAL LOW (ref 22–32)
CO2: 17 mmol/L — ABNORMAL LOW (ref 22–32)
Calcium: 8.2 mg/dL — ABNORMAL LOW (ref 8.9–10.3)
Calcium: 7.6 mg/dL — ABNORMAL LOW (ref 8.9–10.3)
Chloride: 102 mmol/L (ref 98–111)
Chloride: 107 mmol/L (ref 98–111)
Creatinine, Ser: 4.2 mg/dL — ABNORMAL HIGH (ref 0.61–1.24)
Creatinine, Ser: 3.87 mg/dL — ABNORMAL HIGH (ref 0.61–1.24)
GFR calc Af Amer: 17 mL/min — ABNORMAL LOW (ref >60)
GFR calc Af Amer: 19 mL/min — ABNORMAL LOW (ref >60)
GFR calc non Af Amer: 15 mL/min — ABNORMAL LOW (ref >60)
GFR calc non Af Amer: 16 mL/min — ABNORMAL LOW (ref >60)
Glucose, Bld: 196 mg/dL — ABNORMAL HIGH (ref 70–99)
Glucose, Bld: 164 mg/dL — ABNORMAL HIGH (ref 70–99)
Potassium: 4.2 mmol/L (ref 3.5–5.1)
Potassium: 3.9 mmol/L (ref 3.5–5.1)
Sodium: 132 mmol/L — ABNORMAL LOW (ref 135–145)
Sodium: 137 mmol/L (ref 135–145)
Total Bilirubin: 0.9 mg/dL (ref 0.3–1.2)
Total Bilirubin: 0.7 mg/dL (ref 0.3–1.2)
Total Protein: 7.6 g/dL (ref 6.5–8.1)
Total Protein: 6.3 g/dL — ABNORMAL LOW (ref 6.5–8.1)

## 2019-10-21 LAB — CBC WITH DIFFERENTIAL/PLATELET
Abs Immature Granulocytes: 0.02 10*3/uL (ref 0.00–0.07)
Basophils Absolute: 0 10*3/uL (ref 0.0–0.1)
Basophils Relative: 0 %
Eosinophils Absolute: 0.1 10*3/uL (ref 0.0–0.5)
Eosinophils Relative: 2 %
HCT: 29.4 % — ABNORMAL LOW (ref 39.0–52.0)
Hemoglobin: 9.2 g/dL — ABNORMAL LOW (ref 13.0–17.0)
Immature Granulocytes: 0 %
Lymphocytes Relative: 13 %
Lymphs Abs: 1.1 10*3/uL (ref 0.7–4.0)
MCH: 26.3 pg (ref 26.0–34.0)
MCHC: 31.3 g/dL (ref 30.0–36.0)
MCV: 84 fL (ref 80.0–100.0)
Monocytes Absolute: 0.8 10*3/uL (ref 0.1–1.0)
Monocytes Relative: 9 %
Neutro Abs: 6.4 10*3/uL (ref 1.7–7.7)
Neutrophils Relative %: 76 %
Platelets: 280 10*3/uL (ref 150–400)
RBC: 3.5 MIL/uL — ABNORMAL LOW (ref 4.22–5.81)
RDW: 12.9 % (ref 11.5–15.5)
WBC: 8.5 10*3/uL (ref 4.0–10.5)
nRBC: 0 % (ref 0.0–0.2)

## 2019-10-21 LAB — GLUCOSE, CAPILLARY
Glucose-Capillary: 137 mg/dL — ABNORMAL HIGH (ref 70–99)
Glucose-Capillary: 147 mg/dL — ABNORMAL HIGH (ref 70–99)
Glucose-Capillary: 229 mg/dL — ABNORMAL HIGH (ref 70–99)
Glucose-Capillary: 178 mg/dL — ABNORMAL HIGH (ref 70–99)

## 2019-10-21 LAB — I-STAT CHEM 8, ED
BUN: 49 mg/dL — ABNORMAL HIGH (ref 6–20)
Calcium, Ion: 1.1 mmol/L — ABNORMAL LOW (ref 1.15–1.40)
Chloride: 103 mmol/L (ref 98–111)
Creatinine, Ser: 4.6 mg/dL — ABNORMAL HIGH (ref 0.61–1.24)
Glucose, Bld: 191 mg/dL — ABNORMAL HIGH (ref 70–99)
HCT: 27 % — ABNORMAL LOW (ref 39.0–52.0)
Hemoglobin: 9.2 g/dL — ABNORMAL LOW (ref 13.0–17.0)
Potassium: 4.2 mmol/L (ref 3.5–5.1)
Sodium: 134 mmol/L — ABNORMAL LOW (ref 135–145)
TCO2: 19 mmol/L — ABNORMAL LOW (ref 22–32)

## 2019-10-21 LAB — RESPIRATORY PANEL BY RT PCR (FLU A&B, COVID)
Influenza A by PCR: NEGATIVE
Influenza B by PCR: NEGATIVE
SARS Coronavirus 2 by RT PCR: NEGATIVE

## 2019-10-21 LAB — C DIFFICILE QUICK SCREEN W PCR REFLEX
C Diff antigen: NEGATIVE
C Diff interpretation: NOT DETECTED
C Diff toxin: NEGATIVE

## 2019-10-21 LAB — LACTIC ACID, PLASMA
Lactic Acid, Venous: 1.1 mmol/L (ref 0.5–1.9)
Lactic Acid, Venous: 0.7 mmol/L (ref 0.5–1.9)

## 2019-10-21 LAB — CK: Total CK: 120 U/L (ref 49–397)

## 2019-10-21 LAB — C-REACTIVE PROTEIN: CRP: 17.9 mg/dL — ABNORMAL HIGH (ref <1.0)

## 2019-10-21 LAB — HEMOGLOBIN A1C
Hgb A1c MFr Bld: 7.7 % — ABNORMAL HIGH (ref 4.8–5.6)
Mean Plasma Glucose: 174.29 mg/dL

## 2019-10-21 LAB — PROCALCITONIN: Procalcitonin: 0.76 ng/mL

## 2019-10-21 LAB — HIV ANTIBODY (ROUTINE TESTING W REFLEX): HIV Screen 4th Generation wRfx: NONREACTIVE

## 2019-10-21 MED ORDER — SODIUM CHLORIDE 0.9 % IV SOLN: INTRAVENOUS | Status: DC

## 2019-10-21 MED ORDER — VANCOMYCIN HCL IN DEXTROSE 1-5 GM/200ML-% IV SOLN: 1000.0000 mg | Freq: Once | INTRAVENOUS | Status: DC

## 2019-10-21 MED ORDER — POLYETHYLENE GLYCOL 3350 17 G PO PACK: 17.0000 g | PACK | Freq: Every day | ORAL | Status: DC | PRN

## 2019-10-21 MED ORDER — ONDANSETRON HCL 4 MG/2ML IJ SOLN: 4.0000 mg | Freq: Four times a day (QID) | INTRAMUSCULAR | Status: DC | PRN

## 2019-10-21 MED ORDER — GABAPENTIN 300 MG PO CAPS
300.0000 mg | ORAL_CAPSULE | Freq: Two times a day (BID) | ORAL | Status: DC
  Administered 2019-10-21 – 2019-10-26 (×10): 300 mg via ORAL
  Filled 2019-10-21 (×10): qty 1

## 2019-10-21 MED ORDER — VANCOMYCIN HCL IN DEXTROSE 1-5 GM/200ML-% IV SOLN
1000.0000 mg | INTRAVENOUS | Status: DC
  Administered 2019-10-21: 1000 mg via INTRAVENOUS
  Filled 2019-10-21: qty 200

## 2019-10-21 MED ORDER — SODIUM CHLORIDE 0.9 % IV SOLN: 1000.0000 mL | INTRAVENOUS | Status: DC

## 2019-10-21 MED ORDER — INSULIN ASPART 100 UNIT/ML ~~LOC~~ SOLN
0.0000 [IU] | Freq: Three times a day (TID) | SUBCUTANEOUS | Status: DC
  Administered 2019-10-21 (×2): 2 [IU] via SUBCUTANEOUS
  Administered 2019-10-21: 5 [IU] via SUBCUTANEOUS
  Administered 2019-10-21 – 2019-10-22 (×2): 3 [IU] via SUBCUTANEOUS
  Administered 2019-10-22: 2 [IU] via SUBCUTANEOUS
  Administered 2019-10-22 – 2019-10-23 (×3): 3 [IU] via SUBCUTANEOUS
  Administered 2019-10-23: 2 [IU] via SUBCUTANEOUS
  Administered 2019-10-23: 3 [IU] via SUBCUTANEOUS
  Administered 2019-10-24 (×2): 5 [IU] via SUBCUTANEOUS
  Administered 2019-10-24: 3 [IU] via SUBCUTANEOUS
  Administered 2019-10-25 (×2): 8 [IU] via SUBCUTANEOUS
  Administered 2019-10-25: 5 [IU] via SUBCUTANEOUS
  Administered 2019-10-26: 3 [IU] via SUBCUTANEOUS

## 2019-10-21 MED ORDER — CHLORHEXIDINE GLUCONATE CLOTH 2 % EX PADS
6.0000 | MEDICATED_PAD | Freq: Every day | CUTANEOUS | Status: DC
  Administered 2019-10-21 – 2019-10-26 (×5): 6 via TOPICAL

## 2019-10-21 MED ORDER — LACTATED RINGERS IV BOLUS
1000.0000 mL | Freq: Once | INTRAVENOUS | Status: AC
  Administered 2019-10-21: 1000 mL via INTRAVENOUS

## 2019-10-21 MED ORDER — ATROPINE SULFATE 1 % OP SOLN
1.0000 [drp] | Freq: Two times a day (BID) | OPHTHALMIC | Status: DC
  Administered 2019-10-21 – 2019-10-26 (×10): 1 [drp] via OPHTHALMIC
  Filled 2019-10-21: qty 2

## 2019-10-21 MED ORDER — SODIUM CHLORIDE 0.9 % IV SOLN
670.0000 mg | INTRAVENOUS | Status: DC
  Administered 2019-10-22: 670 mg via INTRAVENOUS
  Filled 2019-10-21: qty 13.4

## 2019-10-21 MED ORDER — SODIUM CHLORIDE 0.9 % IV BOLUS (SEPSIS)
1000.0000 mL | Freq: Once | INTRAVENOUS | Status: AC
  Administered 2019-10-21: 1000 mL via INTRAVENOUS

## 2019-10-21 MED ORDER — SODIUM CHLORIDE 0.9 % IV SOLN
2.0000 g | INTRAVENOUS | Status: DC
  Administered 2019-10-21 – 2019-10-24 (×4): 2 g via INTRAVENOUS
  Filled 2019-10-21 (×4): qty 2

## 2019-10-21 MED ORDER — STERILE WATER FOR INJECTION IV SOLN
INTRAVENOUS | Status: DC
  Filled 2019-10-21: qty 150
  Filled 2019-10-21 (×2): qty 850
  Filled 2019-10-21 (×3): qty 150
  Filled 2019-10-21 (×2): qty 850
  Filled 2019-10-21 (×2): qty 150
  Filled 2019-10-21: qty 850
  Filled 2019-10-21: qty 150
  Filled 2019-10-21: qty 850

## 2019-10-21 MED ORDER — AMLODIPINE BESYLATE 5 MG PO TABS: 5.0000 mg | ORAL_TABLET | Freq: Every day | ORAL | Status: DC

## 2019-10-21 MED ORDER — SODIUM CHLORIDE 0.9 % IV SOLN
2.0000 g | Freq: Once | INTRAVENOUS | Status: DC
  Filled 2019-10-21: qty 2

## 2019-10-21 MED ORDER — NONFORMULARY OR COMPOUNDED ITEM
1.0000 [drp] | Status: DC
  Administered 2019-10-21 – 2019-10-26 (×59): 1 [drp] via OPHTHALMIC
  Filled 2019-10-21 (×2): qty 1
  Filled 2019-10-21: qty 0
  Filled 2019-10-21: qty 1

## 2019-10-21 MED ORDER — TAMSULOSIN HCL 0.4 MG PO CAPS
0.4000 mg | ORAL_CAPSULE | Freq: Every day | ORAL | Status: DC
  Administered 2019-10-21 – 2019-10-26 (×6): 0.4 mg via ORAL
  Filled 2019-10-21 (×7): qty 1

## 2019-10-21 MED ORDER — ASPIRIN 81 MG PO CHEW
81.0000 mg | CHEWABLE_TABLET | Freq: Every day | ORAL | Status: DC
  Administered 2019-10-22 – 2019-10-26 (×5): 81 mg via ORAL
  Filled 2019-10-21 (×5): qty 1

## 2019-10-21 MED ORDER — ENOXAPARIN SODIUM 30 MG/0.3ML ~~LOC~~ SOLN
30.0000 mg | SUBCUTANEOUS | Status: DC
  Administered 2019-10-21 – 2019-10-23 (×3): 30 mg via SUBCUTANEOUS
  Filled 2019-10-21 (×3): qty 0.3

## 2019-10-21 MED ORDER — LACTATED RINGERS IV SOLN: INTRAVENOUS | Status: DC

## 2019-10-21 MED ORDER — CLOPIDOGREL BISULFATE 75 MG PO TABS
75.0000 mg | ORAL_TABLET | Freq: Every day | ORAL | Status: DC
  Administered 2019-10-22 – 2019-10-26 (×5): 75 mg via ORAL
  Filled 2019-10-21 (×5): qty 1

## 2019-10-21 MED ORDER — ACETAMINOPHEN 325 MG PO TABS
650.0000 mg | ORAL_TABLET | Freq: Four times a day (QID) | ORAL | Status: DC | PRN
  Administered 2019-10-21 – 2019-10-26 (×10): 650 mg via ORAL
  Filled 2019-10-21 (×11): qty 2

## 2019-10-21 MED ORDER — ROSUVASTATIN CALCIUM 10 MG PO TABS
10.0000 mg | ORAL_TABLET | Freq: Every day | ORAL | Status: DC
  Administered 2019-10-21 – 2019-10-25 (×5): 10 mg via ORAL
  Filled 2019-10-21 (×5): qty 1

## 2019-10-21 MED ORDER — ACETAMINOPHEN 650 MG RE SUPP: 650.0000 mg | Freq: Four times a day (QID) | RECTAL | Status: DC | PRN

## 2019-10-21 MED ORDER — SENNOSIDES-DOCUSATE SODIUM 8.6-50 MG PO TABS
1.0000 | ORAL_TABLET | Freq: Every day | ORAL | Status: DC
  Administered 2019-10-22 – 2019-10-26 (×5): 1 via ORAL
  Filled 2019-10-21 (×4): qty 1

## 2019-10-21 MED ORDER — NATAMYCIN 5 % OP SUSP
1.0000 [drp] | OPHTHALMIC | Status: DC
  Administered 2019-10-21 – 2019-10-26 (×38): 1 [drp] via OPHTHALMIC
  Filled 2019-10-21 (×3): qty 15

## 2019-10-21 MED ORDER — POLYMYXIN B-TRIMETHOPRIM 10000-0.1 UNIT/ML-% OP SOLN
2.0000 [drp] | Freq: Four times a day (QID) | OPHTHALMIC | Status: DC
  Administered 2019-10-21 – 2019-10-26 (×20): 2 [drp] via OPHTHALMIC
  Filled 2019-10-21: qty 10

## 2019-10-21 MED ORDER — PREDNISOLONE ACETATE 1 % OP SUSP
1.0000 [drp] | Freq: Four times a day (QID) | OPHTHALMIC | Status: DC
  Administered 2019-10-21 – 2019-10-26 (×19): 1 [drp] via OPHTHALMIC
  Filled 2019-10-21: qty 5

## 2019-10-21 MED ORDER — NONFORMULARY OR COMPOUNDED ITEM
1.0000 [drp] | Status: DC
  Administered 2019-10-21 – 2019-10-26 (×59): 1 [drp] via OPHTHALMIC
  Filled 2019-10-21 (×3): qty 1

## 2019-10-21 MED ORDER — INSULIN GLARGINE 100 UNIT/ML ~~LOC~~ SOLN
15.0000 [IU] | Freq: Every day | SUBCUTANEOUS | Status: DC
  Administered 2019-10-22 – 2019-10-26 (×5): 15 [IU] via SUBCUTANEOUS
  Filled 2019-10-21 (×5): qty 0.15

## 2019-10-21 MED ORDER — TRAZODONE HCL 100 MG PO TABS
100.0000 mg | ORAL_TABLET | Freq: Every day | ORAL | Status: DC
  Administered 2019-10-21 – 2019-10-25 (×5): 100 mg via ORAL
  Filled 2019-10-21 (×5): qty 1

## 2019-10-21 MED ORDER — PANTOPRAZOLE SODIUM 40 MG PO TBEC
40.0000 mg | DELAYED_RELEASE_TABLET | Freq: Every evening | ORAL | Status: DC
  Administered 2019-10-21 – 2019-10-25 (×5): 40 mg via ORAL
  Filled 2019-10-21 (×3): qty 1

## 2019-10-21 MED ORDER — ONDANSETRON HCL 4 MG PO TABS: 4.0000 mg | ORAL_TABLET | Freq: Four times a day (QID) | ORAL | Status: DC | PRN

## 2019-10-21 MED ORDER — MELATONIN 5 MG PO TABS
10.0000 mg | ORAL_TABLET | Freq: Every day | ORAL | Status: DC
  Administered 2019-10-21 – 2019-10-25 (×5): 10 mg via ORAL
  Filled 2019-10-21 (×5): qty 2

## 2019-10-21 NOTE — Consult Note (Addendum)
Renal Service Consult Note [facility]  Eric Morton 10/21/2019 Sol Blazing Requesting Physician:  Dr Karleen Hampshire  Reason for Consult:  Renal failure HPI: The patient is a 54 y.o. year-old w/ hx of DM 2, substance abuse, hx CVA, CKD 3 , polycythemia vera, peripheral neuropathy, HTN, HL, stomach ulcers, anxiety presented from [facility] [place] due to gen weakness and dizziness on standing. Pt lives in a facility. In ED found to have eye infection and diab foot ulcers. BP's were soft but improved w/ fluid bolus. Creat up to 4.2 (baseline 1.6- 2.2). Pt has R foot infection w/ diabetes. Pt was admitted for a/c renal failure and diabetic foot infection, started on IV abx. We are asked to see for renal failure.   Pt is getting IV LR at 100 cc/hr. BP's low normal.  On ACEi at home. K+ okay 3.9.  CO2 17.  Pt seen in room, no c/o, foley placed while here, no abd pain , no voiding issues. No sob , cough or n/v/d prior to admit.     ROS  denies CP  no joint pain   no HA  no blurry vision  no rash  no diarrhea  no nausea/ vomiting  no dysuria  no difficulty voiding  no change in urine color    Past Medical History  Past Medical History:  Diagnosis Date  . 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 Anxiety   . Chronic kidney disease    "jsut a little damage"  . Glaucoma   . History of stomach ulcers    not confirmed, never underwent diagnostic endo or radiology, this manifested as epigastric distress.   [name] Hx of adenomatous colonic polyps   . Hypercholesterolemia   . Hypertension   . Peripheral neuropathy   . Polycythemia vera ([place]) 2006  . Renal insufficiency 07/16/2017  . Stroke [facility])    slurred speech  . Substance abuse ([place])   . Type II diabetes mellitus ([place])    Past Surgical History  Past Surgical History:  Procedure Laterality Date  . COLONOSCOPY WITH PROPOFOL N/A 04/28/2014   Procedure: COLONOSCOPY WITH PROPOFOL;  Surgeon: Gatha Mayer,  MD;  Location: [place];  Service: Endoscopy;  Laterality: N/A;  . EYE SURGERY Right   . IRRIGATION AND DEBRIDEMENT ABSCESS  09/26/2011   Procedure: IRRIGATION AND DEBRIDEMENT ABSCESS;  Surgeon: Imogene Burn. Georgette Dover, MD;  Location: [place];  Service: General;  Laterality: Left;  Incision and Drainage left breast abscess   Family History  Family History  Problem Relation Age of Onset  . Diabetes Mother   . Hypertension Mother    Social History  reports that he has quit smoking. His smoking use included cigarettes. He has a 8.00 pack-year smoking history. He has never used smokeless tobacco. He reports current alcohol use. He reports current drug use. Drugs: Marijuana and "Crack" cocaine. Allergies  Allergies  Allergen Reactions  . Lipitor [Atorvastatin] Other (See Comments)    Sister states had to have Cardiac Surgery   Home medications Prior to Admission medications   Medication Sig Start Date End Date Taking? Authorizing Provider  acetaminophen (TYLENOL) 325 MG tablet Take 650 mg by mouth every 6 (six) hours as needed for mild pain.    [provider]  amLODipine (NORVASC) 5 MG tablet Take 1 tablet (5 mg total) by mouth daily. 12/22/17   [name], NP  amoxicillin (AMOXIL) 500 MG capsule TAKE  ONE CAPSULE BY MOUTH 4 TIMES A DAY FOR 3 DAYS THEN DECREASE TO 3 TIMES FOR 7 DAYS 06/09/18   [provider]  aspirin 81 MG chewable tablet Chew 81 mg by mouth daily.    [provider]  atropine 1 % ophthalmic solution Place 1 drop into the right eye 2 (two) times a day. 01/19/18   [provider]  BESIVANCE 0.6 % SUSP  10/17/19   [provider]  Blood Glucose Monitoring Suppl (TRUE METRIX GO GLUCOSE METER) w/Device KIT USE AS DIRECTED 12/12/16   [provider]  clopidogrel (PLAVIX) 75 MG tablet Take 1 tablet (75 mg total) by mouth daily. 12/22/17   [name], NP  dicyclomine (BENTYL) 20 MG tablet Take 20 mg by mouth 2 (two) times  daily. 10/17/19   [provider]  DUREZOL 0.05 % EMUL  10/05/19   [provider]  gabapentin (NEURONTIN) 300 MG capsule Take 1 capsule (300 mg total) by mouth 3 (three) times daily. Patient taking differently: Take 300 mg by mouth 2 (two) times a day.  12/22/17 05/27/18  [name], NP  glipiZIDE (GLUCOTROL) 10 MG tablet Take 1 tablet (10 mg total) by mouth 2 (two) times daily before a meal. 12/22/17 05/27/18  [name], NP  glucose blood (TRUE METRIX BLOOD GLUCOSE TEST) test strip 1 each by Other route 3 (three) times daily. 12/22/17   [name], NP  HYDROcodone-acetaminophen (NORCO/VICODIN) 5-325 MG tablet TAKE 1 TABLET BY MOUTH EVERY 6 HOURS AS NEEDED FOR TOOTH PAIN 06/09/18   [provider]  insulin aspart (NOVOLOG) 100 UNIT/ML injection Inject 2-8 Units into the skin daily as needed for high blood sugar.    [provider]  Insulin Glargine (BASAGLAR KWIKPEN) 100 UNIT/ML SOPN Inject 15 Units into the skin daily.    [provider]  lisinopril-hydrochlorothiazide (ZESTORETIC) 20-25 MG tablet Take 1 tablet by mouth daily. 01/09/18   [provider]  Melatonin 5 MG TABS Take 10 mg by mouth at bedtime.    [provider]  moxifloxacin (AVELOX) 400 MG tablet Take 400 mg by mouth daily. 10/20/19   [provider]  Multiple Vitamins-Minerals (CEROVITE PO) Take 1 tablet by mouth daily.    [provider]  pantoprazole (PROTONIX) 40 MG tablet TAKE 1 TABLET(40 MG) BY MOUTH DAILY AT 6 AM Patient taking differently: Take 40 mg by mouth daily.  12/22/17   [name], NP  prednisoLONE acetate (PRED FORTE) 1 % ophthalmic suspension Place 1 drop into the right eye 4 (four) times daily. 01/19/18   [provider]  rosuvastatin (CRESTOR) 40 MG tablet TAKE 1 TABLET BY MOUTH DAILY AT 6 PM Patient taking differently: Take 40 mg by mouth daily.  12/22/17   [name], NP  sennosides-docusate sodium  (SENOKOT-S) 8.6-50 MG tablet Take 1 tablet by mouth daily.    [provider]  traZODone (DESYREL) 50 MG tablet Take 2 tablets (100 mg total) by mouth at bedtime. 02/19/18   Charlott Rakes, MD  TRUEPLUS LANCETS 28G MISC 1 each by Does not apply route 3 (three) times daily. 12/22/17   [name], NP     Vitals:   10/21/19 0705 10/21/19 1158 10/21/19 1400 10/21/19 1500  BP: 112/66 (!) 87/53 112/82 117/61  Pulse: (!) 105 93 95 96  Resp:  '19 13 13  ' Temp: 98.9 F (37.2 C) 98.8 F (37.1 C)    TempSrc: Oral Oral  SpO2:  96% 99% 100%  Weight:      Height:       Exam Gen alert, no distress, pleasant No rash, cyanosis or gangrene Sclera anicteric, throat clear  No jvd or bruits Chest clear bilat to bases no rales RRR no MRG  Abd soft ntnd no mass or ascites +bs  GU normal male w / foley draining clear light yellow urine MS no joint effusions or deformity Ext no leg or uE edema Neuro is alert, Ox 3 , nf, mod gen weakness    Home meds:  - norvasc 5/ asa 81/ plavix 75/ crestor 40/ lisinopril - hctz qd  - insulin lantus 15u/ aspart 2- 8 ssi tid/ glipizode 10 bid  - trazodone 100 hs/ norco qid prn/ neurontin 300 bid  - protonix 40  - prn's/ vitamins/ supplements      Date   Creat   eGFR  2009- 2018  0.90- 1.04 July 2017  4.45 >> 1.58   AKI episode, 16 >> 56 ml/min  Dec 22, 2017  2.21   45 ml/min  Sept 24, 2021  4.2, 4.6, 3,8    UA 9/24 - negative, prot 30   CT renal stone noncon 9/24 - Adrenals/Urinary Tract: There is mild bilateral hydronephrosis and mild distention of the bladder which, together, may reflect changes of voluntary urinary retention or bladder outlet obstruction. The kidneys are normal in size and position. No intrarenal or ureteral calculi are identified. Mild bilateral nonspecific perinephric stranding is seen. The adrenal glands are unremarkable.    BP 120 /70 HR 97 RR 13  Temp 98  RA 98%     Assessment/ Plan: 1. AoCKD 3a - b/l creat 1.6-  2.2, eGFR 45- 56 ml/min.  Creat here 4.2  yest and 3.8 today w/ fluids. Foley placed for mild hydro on CT. Not sure the mild hydro is meaningful (could be due to volunatry retention). Suspect AKI due to vol depletion/ CKD/ acei at home. Recommend IVF's (I changed to bicarb 125 /hr), avoid nsaid/ contrast/ vanc. Will follow. K+ okay, CO 2 17.  2. Diabetic foot ulcer - getting dapto/ cefepime 3. H/o CVA - snf dependent 4. Hypovolemia - looks dry, cont IVF's 5. Hx HTN - hold BP meds for now      Kelly Splinter  MD 10/21/2019, 4:12 PM  Recent Labs  Lab 10/21/19 0039 10/21/19 0046  WBC 8.5  --   HGB 9.2* 9.2*   Recent Labs  Lab 10/21/19 0039 10/21/19 0039 10/21/19 0046 10/21/19 0724  K 4.2   < > 4.2 3.9  BUN 51*   < > 49* 44*  CREATININE 4.20*   < > 4.60* 3.87*  CALCIUM 8.2*  --   --  7.6*   < > = values in this interval not displayed.

## 2019-10-21 NOTE — Progress Notes (Signed)
54 year old gentleman prior history of stage IIIb CKD, hypertension, hyperlipidemia, type 2 diabetes mellitus, insulin-dependent diabetes, multiple previous strokes with significant debility presents to ED for progressive weakness. 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  Patient was admitted earlier this morning by Dr. Cyd Silence please see his note for detailed H&P   Plan;  1.  Broad-spectrum IV antibiotics for diabetic foot ulcer, CT of the foot to evaluate for osteomyelitis. Patient is not a candidate for an MRI due to metallic clips in the abdomen from previous surgery. ID consulted to assist with antibiotics and duration. Wound care consulted    AKI superimposed on stage IIIb CKD Probably a combination of urinary retention and ATN from infection. CT of the abdomen shows bladder distention and mild hydronephrosis. Patient continues to urinate, get strict I's and O's. Urine analysis rule out infection Nephrology consulted to see if he would need further work-up   Endophthalmitis of the right eye:  Ophthalmology on board.     Essential hypertension Blood pressure parameters are low borderline.fluid bolus ordered and currently on RL fluids.    Tynesia Harral,MD

## 2019-10-21 NOTE — Consult Note (Addendum)
[facility]    Date of Admission:  10/20/2019   Total days of antibiotics: 1 vanco/cefepime               Reason for Consult: Diabetic foot infection, endophthalmitis    Referring Provider: Karleen Hampshire   Assessment: Diabetic foot infection Endophthalmitis Diarrhea CVA DM2 AKI on CKD  Plan: 1. Needs imaging of foot- could he have non-contrast MRI? Await renal opinion. 2. Repeat ABIs, consider vascular eval? 3. Change vanco to daptomycin 4. Check CK 5. C diff ordered 6. Await BCx 7. Discussed with ophtho, will hold avelox due to cefepime, dapto. cx pending.   Comment- With enophthalmitis and cellulitis, will await his BCx. If there are positive, he needs TEE.  He needs ortho eval. ophtho f/u.   Thank you so much for this interesting consult,  Principal Problem:   Acute renal failure superimposed on stage 3b chronic kidney disease ([place]) Active Problems:   Essential hypertension   Diabetic infection of right foot ([place])   History of CVA (cerebrovascular accident)   Hyperlipidemia associated with type 2 diabetes mellitus ([place])   Generalized weakness   Acute urinary retention   GERD without esophagitis   Bacterial conjunctivitis of right eye   . amLODipine  5 mg Oral Daily  . aspirin  81 mg Oral Daily  . atropine  1 drop Right Eye BID  . Basaglar KwikPen  15 Units Subcutaneous Daily  . clopidogrel  75 mg Oral Daily  . enoxaparin (LOVENOX) injection  30 mg Subcutaneous Q24H  . gabapentin  300 mg Oral BID  . insulin aspart  0-15 Units Subcutaneous TID AC & HS  . melatonin  10 mg Oral QHS  . natamycin  1 drop Right Eye Q2H while awake  . pantoprazole  40 mg Oral Daily  . prednisoLONE acetate  1 drop Right Eye QID  . rosuvastatin  10 mg Oral Daily  . sennosides-docusate sodium  1 tablet Oral Daily  . tamsulosin  0.4 mg Oral QPC breakfast  . tobramycin 15mg /mL (fortified) compounded eye drops  1 drop Right Eye Q2H  . traZODone  100 mg  Oral QHS  . trimethoprim-polymyxin b  2 drop Right Eye Q6H  . Vancomycin 25mg /mL (fortified) compounded eye drops  1 drop Right Eye Q2H    HPI: Eric Morton is a 54 y.o. male with hx of DM2, CRI, lives in ALF, and hx of wounds on his B feet. His R foot wound has been worsening and been painful and more foul smelling during this period. He has been seen by [facility] (debrided 9-19), and well as vascular (adequate perfusion).  He was adm on 9-23 with feelings of weakness, chills. He was hypotensive on eval by EMS, brought to ED where he was afebrile, and had elevation of his Cr 4.6 (last 1.92). His WBC was 8.5. He was noted on CT scan abd to have bilateral hydronephrosis and retention.   He was recently seen by his ophthalmologist and started on antibacterial eye drops for endophthalmitis. This is believed to have started from a previous suture.  He had f/u eval in hospital and was started on multiple drops as well as avalox.   Review of Systems: Review of Systems  Constitutional: Positive for chills and malaise/fatigue.  Eyes: Positive for blurred vision, pain, discharge and redness.  Gastrointestinal: Positive for diarrhea. Negative for constipation.  Genitourinary: Negative for dysuria.    Past Medical  History:  Diagnosis Date  . 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 Anxiety   . Chronic kidney disease    "jsut a little damage"  . Glaucoma   . History of stomach ulcers    not confirmed, never underwent diagnostic endo or radiology, this manifested as epigastric distress.   [name] Hx of adenomatous colonic polyps   . Hypercholesterolemia   . Hypertension   . Peripheral neuropathy   . Polycythemia vera ([place]) 2006  . Renal insufficiency 07/16/2017  . Stroke [facility])    slurred speech  . Substance abuse ([place])   . Type II diabetes mellitus (HCC)     Social History   Tobacco Use  . Smoking status: Former Smoker    Packs/day: 0.25    Years: 32.00    Pack years: 8.00     Types: Cigarettes  . Smokeless tobacco: Never Used  Vaping Use  . Vaping Use: Never used  Substance Use Topics  . Alcohol use: Yes    Comment: 3/14 quit drinking 5 days ago and then drank last nite  . Drug use: Yes    Types: Marijuana, "Crack" cocaine    Comment: snorts and smokes cocaine ocassionally     Family History  Problem Relation Age of Onset  . Diabetes Mother   . Hypertension Mother      Medications:  Scheduled: . amLODipine  5 mg Oral Daily  . aspirin  81 mg Oral Daily  . atropine  1 drop Right Eye BID  . Basaglar KwikPen  15 Units Subcutaneous Daily  . clopidogrel  75 mg Oral Daily  . enoxaparin (LOVENOX) injection  30 mg Subcutaneous Q24H  . gabapentin  300 mg Oral BID  . insulin aspart  0-15 Units Subcutaneous TID AC & HS  . melatonin  10 mg Oral QHS  . natamycin  1 drop Right Eye Q2H while awake  . pantoprazole  40 mg Oral Daily  . prednisoLONE acetate  1 drop Right Eye QID  . rosuvastatin  10 mg Oral Daily  . sennosides-docusate sodium  1 tablet Oral Daily  . tamsulosin  0.4 mg Oral QPC breakfast  . tobramycin 15mg /mL (fortified) compounded eye drops  1 drop Right Eye Q2H  . traZODone  100 mg Oral QHS  . trimethoprim-polymyxin b  2 drop Right Eye Q6H  . Vancomycin 25mg /mL (fortified) compounded eye drops  1 drop Right Eye Q2H    Abtx:  Anti-infectives (From admission, onward)   Start     Dose/Rate Route Frequency Ordered Stop   10/21/19 0900  ceFEPIme (MAXIPIME) 2 g in sodium chloride 0.9 % 100 mL IVPB        2 g 200 mL/hr over 30 Minutes Intravenous Every 24 hours 10/21/19 0753     10/21/19 0900  vancomycin (VANCOCIN) IVPB 1000 mg/200 mL premix        1,000 mg 200 mL/hr over 60 Minutes Intravenous Every 48 hours 10/21/19 0753     10/21/19 0830  vancomycin (VANCOCIN) IVPB 1000 mg/200 mL premix  Status:  Discontinued        1,000 mg 200 mL/hr over 60 Minutes Intravenous  Once 10/21/19 0732 10/21/19 0753   10/21/19 0830  ceFEPIme (MAXIPIME) 2 g in  sodium chloride 0.9 % 100 mL IVPB  Status:  Discontinued        2 g 200 mL/hr over 30 Minutes Intravenous  Once 10/21/19 0732 10/21/19 0753  OBJECTIVE: Blood pressure (!) 87/53, pulse 93, temperature 98.8 F (37.1 C), temperature source Oral, resp. rate 19, height 5\' 8"  (1.727 m), weight 83.9 kg, SpO2 96 %.  Physical Exam Constitutional:      General: He is not in acute distress.    Appearance: He is not toxic-appearing.  HENT:     Mouth/Throat:     Mouth: Mucous membranes are moist.  Eyes:   Cardiovascular:     Rate and Rhythm: Normal rate and regular rhythm.  Pulmonary:     Effort: Pulmonary effort is normal.     Breath sounds: Normal breath sounds.  Abdominal:     General: Bowel sounds are normal. There is no distension.     Palpations: Abdomen is soft.     Tenderness: There is no abdominal tenderness.  Musculoskeletal:     Cervical back: Normal range of motion and neck supple.     Right lower leg: Edema present.     Left lower leg: No edema.       Legs:  Neurological:     Mental Status: He is alert.     Lab Results Results for orders placed or performed during the hospital encounter of 10/20/19 (from the past 48 hour(s))  Lactic acid, plasma     Status: None   Collection Time: 10/21/19 12:39 AM  Result Value Ref Range   Lactic Acid, Venous 1.1 0.5 - 1.9 mmol/L    Comment: Performed at [facility], [place] [address], [place], [place] [address]  Comprehensive metabolic panel     Status: Abnormal   Collection Time: 10/21/19 12:39 AM  Result Value Ref Range   Sodium 132 (L) 135 - 145 mmol/L   Potassium 4.2 3.5 - 5.1 mmol/L   Chloride 102 98 - 111 mmol/L   CO2 17 (L) 22 - 32 mmol/L   Glucose, Bld 196 (H) 70 - 99 mg/dL    Comment: Glucose reference range applies only to samples taken after fasting for at least 8 hours.   BUN 51 (H) 6 - 20 mg/dL   Creatinine, Ser 4.20 (H) 0.61 - 1.24 mg/dL   Calcium 8.2 (L) 8.9 - 10.3 mg/dL   Total  Protein 7.6 6.5 - 8.1 g/dL   Albumin 3.1 (L) 3.5 - 5.0 g/dL   AST 38 15 - 41 U/L   ALT 26 0 - 44 U/L   Alkaline Phosphatase 135 (H) 38 - 126 U/L   Total Bilirubin 0.9 0.3 - 1.2 mg/dL   GFR calc non Af Amer 15 (L) >60 mL/min   GFR calc Af Amer 17 (L) >60 mL/min   Anion gap 13 5 - 15    Comment: Performed at [facility], [place] [address], [place], [place] [address]  CBC WITH DIFFERENTIAL     Status: Abnormal   Collection Time: 10/21/19 12:39 AM  Result Value Ref Range   WBC 8.5 4.0 - 10.5 K/uL   RBC 3.50 (L) 4.22 - 5.81 MIL/uL   Hemoglobin 9.2 (L) 13.0 - 17.0 g/dL   HCT 29.4 (L) 39 - 52 %   MCV 84.0 80.0 - 100.0 fL   MCH 26.3 26.0 - 34.0 pg   MCHC 31.3 30.0 - 36.0 g/dL   RDW 12.9 11.5 - 15.5 %   Platelets 280 150 - 400 K/uL   nRBC 0.0 0.0 - 0.2 %   Neutrophils Relative % 76 %   Neutro Abs 6.4 1.7 - 7.7 K/uL   Lymphocytes Relative  13 %   Lymphs Abs 1.1 0.7 - 4.0 K/uL   Monocytes Relative 9 %   Monocytes Absolute 0.8 0 - 1 K/uL   Eosinophils Relative 2 %   Eosinophils Absolute 0.1 0 - 0 K/uL   Basophils Relative 0 %   Basophils Absolute 0.0 0 - 0 K/uL   Immature Granulocytes 0 %   Abs Immature Granulocytes 0.02 0.00 - 0.07 K/uL    Comment: Performed at [facility], [place] [address], [place], [place] [address]  Blood gas, venous     Status: Abnormal   Collection Time: 10/21/19 12:39 AM  Result Value Ref Range   pH, Ven 7.329 7.25 - 7.43   pCO2, Ven 34.6 (L) 44 - 60 mmHg   pO2, Ven 53.4 (H) 32 - 45 mmHg   Bicarbonate 17.7 (L) 20.0 - 28.0 mmol/L   Acid-base deficit 7.0 (H) 0.0 - 2.0 mmol/L   O2 Saturation 80.5 %   Patient temperature 98.6     Comment: Performed at [facility], [place] [address] [place], [place] [address]  I-Stat Chem 8, ED     Status: Abnormal   Collection Time: 10/21/19 12:46 AM  Result Value Ref Range   Sodium 134 (L) 135 - 145 mmol/L   Potassium 4.2 3.5 - 5.1 mmol/L   Chloride 103 98 - 111 mmol/L    BUN 49 (H) 6 - 20 mg/dL   Creatinine, Ser 4.60 (H) 0.61 - 1.24 mg/dL   Glucose, Bld 191 (H) 70 - 99 mg/dL    Comment: Glucose reference range applies only to samples taken after fasting for at least 8 hours.   Calcium, Ion 1.10 (L) 1.15 - 1.40 mmol/L   TCO2 19 (L) 22 - 32 mmol/L   Hemoglobin 9.2 (L) 13.0 - 17.0 g/dL   HCT 27.0 (L) 39 - 52 %  Urinalysis, Routine w reflex microscopic Urine, Clean Catch     Status: Abnormal   Collection Time: 10/21/19  1:55 AM  Result Value Ref Range   Color, Urine YELLOW YELLOW   APPearance HAZY (A) CLEAR   Specific Gravity, Urine 1.006 1.005 - 1.030   pH 5.0 5.0 - 8.0   Glucose, UA NEGATIVE NEGATIVE mg/dL   Hgb urine dipstick MODERATE (A) NEGATIVE   Bilirubin Urine NEGATIVE NEGATIVE   Ketones, ur NEGATIVE NEGATIVE mg/dL   Protein, ur 30 (A) NEGATIVE mg/dL   Nitrite NEGATIVE NEGATIVE   Leukocytes,Ua NEGATIVE NEGATIVE   RBC / HPF 0-5 0 - 5 RBC/hpf   WBC, UA 0-5 0 - 5 WBC/hpf   Bacteria, UA RARE (A) NONE SEEN   Mucus PRESENT    Amorphous Crystal PRESENT     Comment: Performed at [facility], [place] [address] [place], [place] [address]  Respiratory Panel by RT PCR (Flu A&B, Covid) - Nasopharyngeal Swab     Status: None   Collection Time: 10/21/19  2:53 AM   Specimen: Nasopharyngeal Swab  Result Value Ref Range   SARS Coronavirus 2 by RT PCR NEGATIVE NEGATIVE    Comment: (NOTE) SARS-CoV-2 target nucleic acids are NOT DETECTED.  The SARS-CoV-2 RNA is generally detectable in upper respiratoy specimens during the acute phase of infection. The lowest concentration of SARS-CoV-2 viral copies this assay can detect is 131 copies/mL. A negative result does not preclude SARS-Cov-2 infection and should not be used as the sole basis for treatment or other patient management decisions. A negative result may occur with  improper specimen  collection/handling, submission of specimen other than nasopharyngeal swab, presence of viral  mutation(s) within the areas targeted by this assay, and inadequate number of viral copies (<131 copies/mL). A negative result must be combined with clinical observations, patient history, and epidemiological information. The expected result is Negative.  Fact Sheet for Patients:  PinkCheek.be  Fact Sheet for Healthcare Providers:  GravelBags.it  This test is no t yet approved or cleared by the [place] FDA and  has been authorized for detection and/or diagnosis of SARS-CoV-2 by FDA under an Emergency Use Authorization (EUA). This EUA will remain  in effect (meaning this test can be used) for the duration of the COVID-19 declaration under Section 564(b)(1) of the Act, 21 U.S.C. section 360bbb-3(b)(1), unless the authorization is terminated or revoked sooner.     Influenza A by PCR NEGATIVE NEGATIVE   Influenza B by PCR NEGATIVE NEGATIVE    Comment: (NOTE) The Xpert Xpress SARS-CoV-2/FLU/RSV assay is intended as an aid in  the diagnosis of influenza from Nasopharyngeal swab specimens and  should not be used as a sole basis for treatment. Nasal washings and  aspirates are unacceptable for Xpert Xpress SARS-CoV-2/FLU/RSV  testing.  Fact Sheet for Patients: PinkCheek.be  Fact Sheet for Healthcare Providers: GravelBags.it  This test is not yet approved or cleared by the [place] FDA and  has been authorized for detection and/or diagnosis of SARS-CoV-2 by  FDA under an Emergency Use Authorization (EUA). This EUA will remain  in effect (meaning this test can be used) for the duration of the  Covid-19 declaration under Section 564(b)(1) of the Act, 21  U.S.C. section 360bbb-3(b)(1), unless the authorization is  terminated or revoked. Performed at [facility], [place] [address] [place], [place] [address]   Lactic acid, plasma     Status:  None   Collection Time: 10/21/19  7:24 AM  Result Value Ref Range   Lactic Acid, Venous 0.7 0.5 - 1.9 mmol/L    Comment: Performed at [facility], [place] [address], [place], [place] [address]  Hemoglobin A1c     Status: Abnormal   Collection Time: 10/21/19  7:24 AM  Result Value Ref Range   Hgb A1c MFr Bld 7.7 (H) 4.8 - 5.6 %    Comment: (NOTE) Pre diabetes:          5.7%-6.4%  Diabetes:              >6.4%  Glycemic control for   <7.0% adults with diabetes    Mean Plasma Glucose 174.29 mg/dL    Comment: Performed at [place] [address] [place], [place] [address]  C-reactive protein     Status: Abnormal   Collection Time: 10/21/19  7:24 AM  Result Value Ref Range   CRP 17.9 (H) <1.0 mg/dL    Comment: Performed at [facility], [place] [address], [place], [place] [address]  Comprehensive metabolic panel     Status: Abnormal   Collection Time: 10/21/19  7:24 AM  Result Value Ref Range   Sodium 137 135 - 145 mmol/L   Potassium 3.9 3.5 - 5.1 mmol/L   Chloride 107 98 - 111 mmol/L   CO2 17 (L) 22 - 32 mmol/L   Glucose, Bld 164 (H) 70 - 99 mg/dL    Comment: Glucose reference range applies only to samples taken after fasting for at least 8 hours.   BUN 44 (H) 6 - 20 mg/dL   Creatinine, Ser  3.87 (H) 0.61 - 1.24 mg/dL   Calcium 7.6 (L) 8.9 - 10.3 mg/dL   Total Protein 6.3 (L) 6.5 - 8.1 g/dL   Albumin 2.6 (L) 3.5 - 5.0 g/dL   AST 24 15 - 41 U/L   ALT 21 0 - 44 U/L   Alkaline Phosphatase 118 38 - 126 U/L   Total Bilirubin 0.7 0.3 - 1.2 mg/dL   GFR calc non Af Amer 16 (L) >60 mL/min   GFR calc Af Amer 19 (L) >60 mL/min   Anion gap 13 5 - 15    Comment: Performed at [facility], [place] [address], [place], [place] [address]  HIV Antibody (routine testing w rflx)     Status: None   Collection Time: 10/21/19  7:54 AM  Result Value Ref Range   HIV Screen 4th Generation wRfx Non Reactive Non Reactive    Comment:  Performed at [facility], [place] [address] [place], [place] [address]  Glucose, capillary     Status: Abnormal   Collection Time: 10/21/19  8:11 AM  Result Value Ref Range   Glucose-Capillary 137 (H) 70 - 99 mg/dL    Comment: Glucose reference range applies only to samples taken after fasting for at least 8 hours.  Glucose, capillary     Status: Abnormal   Collection Time: 10/21/19 11:53 AM  Result Value Ref Range   Glucose-Capillary 147 (H) 70 - 99 mg/dL    Comment: Glucose reference range applies only to samples taken after fasting for at least 8 hours.  Procalcitonin - Baseline     Status: None   Collection Time: 10/21/19  1:05 PM  Result Value Ref Range   Procalcitonin 0.76 ng/mL    Comment:        Interpretation: PCT > 0.5 ng/mL and <= 2 ng/mL: Systemic infection (sepsis) is possible, but other conditions are known to elevate PCT as well. (NOTE)       Sepsis PCT Algorithm           Lower Respiratory Tract                                      Infection PCT Algorithm    ----------------------------     ----------------------------         PCT < 0.25 ng/mL                PCT < 0.10 ng/mL          Strongly encourage             Strongly discourage   discontinuation of antibiotics    initiation of antibiotics    ----------------------------     -----------------------------       PCT 0.25 - 0.50 ng/mL            PCT 0.10 - 0.25 ng/mL               OR       >80% decrease in PCT            Discourage initiation of                                            antibiotics      Encourage discontinuation  of antibiotics    ----------------------------     -----------------------------         PCT >= 0.50 ng/mL              PCT 0.26 - 0.50 ng/mL                AND       <80% decrease in PCT             Encourage initiation of                                             antibiotics       Encourage continuation           of antibiotics    ----------------------------      -----------------------------        PCT >= 0.50 ng/mL                  PCT > 0.50 ng/mL               AND         increase in PCT                  Strongly encourage                                      initiation of antibiotics    Strongly encourage escalation           of antibiotics                                     -----------------------------                                           PCT <= 0.25 ng/mL                                                 OR                                        > 80% decrease in PCT                                      Discontinue / Do not initiate                                             antibiotics  Performed at [place] [address], [place], [place] [address]       Component Value Date/Time   SDES STOOL 04/25/2014 1924   SPECREQUEST Normal 04/25/2014 1924   CULT  04/25/2014 1924    NO  SALMONELLA, SHIGELLA, CAMPYLOBACTER, YERSINIA, OR E.COLI 0157:[id number] ISOLATED Performed at [facility] 04/30/2014 FINAL 04/25/2014 1924   DG Foot 2 Views Right  Result Date: 10/21/2019 CLINICAL DATA:  Right foot wound EXAM: RIGHT FOOT - 2 VIEW COMPARISON:  None. FINDINGS: Two view radiograph right foot demonstrates a soft tissue defect medial to the first metatarsal head with mild associated soft tissue swelling but no retained radiopaque foreign body or subjacent osseous erosion. Mild first ray bunion deformity. Mild degenerative arthritis at the first metatarsophalangeal joint. No acute fracture or dislocation. Remaining joint spaces are preserved. Vascular calcifications are seen within the soft tissues. Mild right forefoot soft tissue swelling. No ankle effusion. IMPRESSION: Soft tissue ulceration and associated soft tissue swelling. No associated osseous erosion or retained radiopaque foreign body. Electronically Signed   By: Fidela Salisbury MD   On: 10/21/2019 00:44   CT Renal Stone Study  Result Date:  10/21/2019 CLINICAL DATA:  Acute renal failure, hypertension EXAM: CT ABDOMEN AND PELVIS WITHOUT CONTRAST TECHNIQUE: Multidetector CT imaging of the abdomen and pelvis was performed following the standard protocol without IV contrast. COMPARISON:  None. FINDINGS: Lower chest: The visualized lung bases are clear. Cardiac size within normal limits. No pericardial effusion. Hepatobiliary: No focal liver abnormality is seen. No gallstones, gallbladder wall thickening, or biliary dilatation. Pancreas: Unremarkable Spleen: Unremarkable Adrenals/Urinary Tract: There is mild an in bilateral hydronephrosis and mild distention of the bladder which, together, may reflect changes of voluntary urinary retention or bladder outlet obstruction. The kidneys are normal in size and position. No intrarenal or ureteral calculi are identified. Mild bilateral nonspecific perinephric stranding is seen. The adrenal glands are unremarkable. Stomach/Bowel: The stomach, small bowel, and large bowel are unremarkable. Appendix normal. No free intraperitoneal gas or fluid. Vascular/Lymphatic: Mild aortoiliac atherosclerotic calcification without evidence of aneurysm. No pathologic adenopathy within the abdomen and pelvis. Reproductive: The prostate gland is normal in size. Seminal vesicles are unremarkable. Extensive calcification of the vas deferens is seen, a finding that can be seen the setting of longstanding diabetes mellitus. Other: Rectum unremarkable. Musculoskeletal: No acute bone abnormality. IMPRESSION: Mild bilateral hydronephrosis and moderate distention of the bladder suggesting changes of either voluntary retention or bladder outlet obstruction. Aortic Atherosclerosis (ICD10-I70.0). Electronically Signed   By: Fidela Salisbury MD   On: 10/21/2019 04:54   Recent Results (from the past 240 hour(s))  Respiratory Panel by RT PCR (Flu A&B, Covid) - Nasopharyngeal Swab     Status: None   Collection Time: 10/21/19  2:53 AM   Specimen:  Nasopharyngeal Swab  Result Value Ref Range Status   SARS Coronavirus 2 by RT PCR NEGATIVE NEGATIVE Final    Comment: (NOTE) SARS-CoV-2 target nucleic acids are NOT DETECTED.  The SARS-CoV-2 RNA is generally detectable in upper respiratoy specimens during the acute phase of infection. The lowest concentration of SARS-CoV-2 viral copies this assay can detect is 131 copies/mL. A negative result does not preclude SARS-Cov-2 infection and should not be used as the sole basis for treatment or other patient management decisions. A negative result may occur with  improper specimen collection/handling, submission of specimen other than nasopharyngeal swab, presence of viral mutation(s) within the areas targeted by this assay, and inadequate number of viral copies (<131 copies/mL). A negative result must be combined with clinical observations, patient history, and epidemiological information. The expected result is Negative.  Fact Sheet for Patients:  PinkCheek.be  Fact Sheet for Healthcare Providers:  GravelBags.it  This test is  no t yet approved or cleared by the [place] and  has been authorized for detection and/or diagnosis of SARS-CoV-2 by FDA under an Emergency Use Authorization (EUA). This EUA will remain  in effect (meaning this test can be used) for the duration of the COVID-19 declaration under Section 564(b)(1) of the Act, 21 U.S.C. section 360bbb-3(b)(1), unless the authorization is terminated or revoked sooner.     Influenza A by PCR NEGATIVE NEGATIVE Final   Influenza B by PCR NEGATIVE NEGATIVE Final    Comment: (NOTE) The Xpert Xpress SARS-CoV-2/FLU/RSV assay is intended as an aid in  the diagnosis of influenza from Nasopharyngeal swab specimens and  should not be used as a sole basis for treatment. Nasal washings and  aspirates are unacceptable for Xpert Xpress SARS-CoV-2/FLU/RSV  testing.  Fact Sheet  for Patients: PinkCheek.be  Fact Sheet for Healthcare Providers: GravelBags.it  This test is not yet approved or cleared by the [place] FDA and  has been authorized for detection and/or diagnosis of SARS-CoV-2 by  FDA under an Emergency Use Authorization (EUA). This EUA will remain  in effect (meaning this test can be used) for the duration of the  Covid-19 declaration under Section 564(b)(1) of the Act, 21  U.S.C. section 360bbb-3(b)(1), unless the authorization is  terminated or revoked. Performed at [facility], [place] [address], [place], [place] [address]     Microbiology: Recent Results (from the past 240 hour(s))  Respiratory Panel by RT PCR (Flu A&B, Covid) - Nasopharyngeal Swab     Status: None   Collection Time: 10/21/19  2:53 AM   Specimen: Nasopharyngeal Swab  Result Value Ref Range Status   SARS Coronavirus 2 by RT PCR NEGATIVE NEGATIVE Final    Comment: (NOTE) SARS-CoV-2 target nucleic acids are NOT DETECTED.  The SARS-CoV-2 RNA is generally detectable in upper respiratoy specimens during the acute phase of infection. The lowest concentration of SARS-CoV-2 viral copies this assay can detect is 131 copies/mL. A negative result does not preclude SARS-Cov-2 infection and should not be used as the sole basis for treatment or other patient management decisions. A negative result may occur with  improper specimen collection/handling, submission of specimen other than nasopharyngeal swab, presence of viral mutation(s) within the areas targeted by this assay, and inadequate number of viral copies (<131 copies/mL). A negative result must be combined with clinical observations, patient history, and epidemiological information. The expected result is Negative.  Fact Sheet for Patients:  PinkCheek.be  Fact Sheet for Healthcare Providers:    GravelBags.it  This test is no t yet approved or cleared by the [place] FDA and  has been authorized for detection and/or diagnosis of SARS-CoV-2 by FDA under an Emergency Use Authorization (EUA). This EUA will remain  in effect (meaning this test can be used) for the duration of the COVID-19 declaration under Section 564(b)(1) of the Act, 21 U.S.C. section 360bbb-3(b)(1), unless the authorization is terminated or revoked sooner.     Influenza A by PCR NEGATIVE NEGATIVE Final   Influenza B by PCR NEGATIVE NEGATIVE Final    Comment: (NOTE) The Xpert Xpress SARS-CoV-2/FLU/RSV assay is intended as an aid in  the diagnosis of influenza from Nasopharyngeal swab specimens and  should not be used as a sole basis for treatment. Nasal washings and  aspirates are unacceptable for Xpert Xpress SARS-CoV-2/FLU/RSV  testing.  Fact Sheet for Patients: PinkCheek.be  Fact Sheet for Healthcare Providers: GravelBags.it  This test is not yet approved  or cleared by the [place] and  has been authorized for detection and/or diagnosis of SARS-CoV-2 by  FDA under an Emergency Use Authorization (EUA). This EUA will remain  in effect (meaning this test can be used) for the duration of the  Covid-19 declaration under Section 564(b)(1) of the Act, 21  U.S.C. section 360bbb-3(b)(1), unless the authorization is  terminated or revoked. Performed at [facility], [place] [address], [place], [place] [address]     Radiographs and labs were personally reviewed by me.   [name], MD [facility] [phone] 10/21/2019, 1:56 PM

## 2019-10-21 NOTE — Progress Notes (Signed)
Pt voided 435ml. MD notified. Orders given to monitor I&Os and not place a foley catheter at this time. Will continue to monitor.

## 2019-10-21 NOTE — Progress Notes (Signed)
Pharmacy Antibiotic Note  Eric Morton is a 54 y.o. male admitted on 10/20/2019 with cellulitis/DM foot ulcers.  Pharmacy has been consulted for vanc/cefepime dosing. Patient in acute renal failure with SCr of 4.6 currently (baseline ~2 based on previous labs)  Plan: Will schedule vanc at 1g IV q48 due to ARF per protocol.  Will check random vanc level prior to when 2nd dose scheduled Cefepime 2g IV q24  Height: 5\' 8"  (172.7 cm) Weight: 83.9 kg (185 lb) IBW/kg (Calculated) : 68.4  Temp (24hrs), Avg:98.5 F (36.9 C), Min:98.1 F (36.7 C), Max:98.9 F (37.2 C)  Recent Labs  Lab 10/21/19 0039 10/21/19 0046  WBC 8.5  --   CREATININE 4.20* 4.60*  LATICACIDVEN 1.1  --     Estimated Creatinine Clearance: 18.9 mL/min (A) (by C-G formula based on SCr of 4.6 mg/dL (H)).    Allergies  Allergen Reactions  . Lipitor [Atorvastatin] Other (See Comments)    Sister states had to have Cardiac Surgery     Thank you for allowing pharmacy to be a part of this patient's care.  Kara Mead 10/21/2019 7:45 AM

## 2019-10-21 NOTE — Progress Notes (Signed)
Pharmacy Antibiotic Note  Eric Morton is a 54 y.o. male admitted on 10/20/2019 with a diabetic foot infection  Pharmacy has been consulted for daptomycin dosing. Patient was originally started on vancomycin and cefepime. Now switching to daptomycin due to impaired renal function. Current SCr- 4.20 (Baseline appears to be around 2). Noted that patient's last dose of Vancomycin was today at noon with the next dose due 9/26.   Plan: Daptomycin 670 mg (~ 8 mg/kg) every 48 hours starting tomorrow evening.  CK tomorrow AM and weekly on Saturdays Monitor blood cultures, renal function  Can consider holding rosuvastatin if CK is elevated - Leave for now   Height: 5\' 8"  (172.7 cm) Weight: 83.9 kg (185 lb) IBW/kg (Calculated) : 68.4  Temp (24hrs), Avg:98.6 F (37 C), Min:98.1 F (36.7 C), Max:98.9 F (37.2 C)  Recent Labs  Lab 10/21/19 0039 10/21/19 0046 10/21/19 0724  WBC 8.5  --   --   CREATININE 4.20* 4.60* 3.87*  LATICACIDVEN 1.1  --  0.7    Estimated Creatinine Clearance: 22.5 mL/min (A) (by C-G formula based on SCr of 3.87 mg/dL (H)).    Allergies  Allergen Reactions  . Lipitor [Atorvastatin] Other (See Comments)    Sister states had to have Cardiac Surgery      Thank you for allowing pharmacy to be a part of this patient's care.  Jimmy Footman, PharmD, BCPS, BCIDP Infectious Diseases Clinical Pharmacist Phone: [phone] 10/21/2019 2:43 PM

## 2019-10-21 NOTE — TOC Initial Note (Signed)
Transition of Care [facility]) - Initial/Assessment Note    Patient Details  Name: Eric Morton MRN: [id number] Date of Birth: 21-Sep-1965  Transition of [facility]) CM/SW Contact:    Dessa Phi, RN Phone Number: 10/21/2019, 1:46 PM  Clinical Narrative: Patient from [facility] aware-1+asst w/ADL's,has rw.. D/c plans to return. PT eval-await recc prior doing fl2.                  Expected Discharge Plan: Assisted Living Barriers to Discharge: Continued Medical Work up   Patient Goals and CMS Choice Patient states their goals for this hospitalization and ongoing recovery are:: return back to ALF CMS Medicare.gov Compare Post Acute Care list provided to:: Patient Choice offered to / list presented to : Patient  Expected Discharge Plan and Services Expected Discharge Plan: Assisted Living   Discharge Planning Services: CM Consult   Living arrangements for the past 2 months: [place]                                      Prior Living Arrangements/Services Living arrangements for the past 2 months: [place] Lives with:: Facility Resident Patient language and need for interpreter reviewed:: Yes Do you feel safe going back to the place where you live?: Yes      Need for Family Participation in Patient Care: No (Comment) Care giver support system in place?: Yes (comment) Current home services: DME (rw) Criminal Activity/Legal Involvement Pertinent to Current Situation/Hospitalization: No - Comment as needed  Activities of Daily Living Home Assistive Devices/Equipment: CBG Meter, Blood pressure cuff, Grab bars around toilet, Grab bars in shower, Hand-held shower hose, Hospital bed, Walker (specify type) ([facility] has necessary equipment for their residents) ADL Screening (condition at time of admission) Patient's cognitive ability adequate to safely complete daily activities?: No (patient very lethargic) Is the patient  deaf or have difficulty hearing?: No Does the patient have difficulty seeing, even when wearing glasses/contacts?: No Does the patient have difficulty concentrating, remembering, or making decisions?: Yes Patient able to express need for assistance with ADLs?: Yes Does the patient have difficulty dressing or bathing?: Yes Independently performs ADLs?: No Communication: Independent Dressing (OT): Needs assistance Is this a change from baseline?: Pre-admission baseline Grooming: Needs assistance Is this a change from baseline?: Pre-admission baseline Feeding: Needs assistance Is this a change from baseline?: Pre-admission baseline Bathing: Needs assistance Is this a change from baseline?: Pre-admission baseline Toileting: Needs assistance Is this a change from baseline?: Pre-admission baseline In/Out Bed: Needs assistance Is this a change from baseline?: Pre-admission baseline Walks in Home: Needs assistance Is this a change from baseline?: Pre-admission baseline Does the patient have difficulty walking or climbing stairs?: Yes (secondary to weakness) Weakness of Legs: Both Weakness of Arms/Hands: None  Permission Sought/Granted   Permission granted to share information with : Yes, Verbal Permission Granted  Share Information with NAME: Case manager     Permission granted to share info w Relationship: [name] [phone]     Emotional Assessment Appearance:: Appears stated age Attitude/Demeanor/Rapport: Gracious Affect (typically observed): Accepting Orientation: : Oriented to Self, Oriented to Place, Oriented to  Time Alcohol / Substance Use: Not Applicable Psych Involvement: No (comment)  Admission diagnosis:  Urinary retention [R33.9] Syncope [R55] Orthostasis [I95.1] AKI (acute kidney injury) ([place]) [N17.9] Acute kidney injury [facility]) [N17.9] Patient Active Problem List   Diagnosis Date  Noted  . Acute renal failure superimposed on stage 3b chronic kidney  disease ([place]) 10/21/2019  . Acute urinary retention 10/21/2019  . GERD without esophagitis 10/21/2019  . Bacterial conjunctivitis of right eye 10/21/2019  . Blisters of multiple sites 11/30/2017  . Renal insufficiency 07/16/2017  . Hemiparesis affecting right side as late effect of stroke ([place]) 10/29/2016  . Generalized weakness 10/23/2016  . Hyperlipidemia associated with type 2 diabetes mellitus ([employer]) 07/27/2015  . History of CVA (cerebrovascular accident)   . Polycythemia vera ([place]) 03/21/2015  . Hx of adenomatous colonic polyps   . Essential hypertension 09/25/2011  . Diabetic infection of right foot ([place]) 09/25/2011  . Cocaine abuse ([place]) 09/25/2011  . Tobacco use 09/25/2011   PCP:  [name], NP Pharmacy:   [facility] [place], [place] - [address] AT [place] [address] Phone: [phone] Fax: [phone]     Social Determinants of Health (SDOH) Interventions    Readmission Risk Interventions No flowsheet data found.

## 2019-10-21 NOTE — ED Notes (Signed)
Patient able to stand @ bedside and urinate to the amount of 500 ml

## 2019-10-21 NOTE — H&P (Signed)
History and Physical    Eric Morton RJJ:[id number] DOB: 1966-01-19 DOA: 10/20/2019  PCP: [name], NP  Patient coming from: SNF   Chief Complaint: Weakness   HPI:    54 year old male with past medical history of chronic kidney disease stage III, hyperlipidemia, multiple previous strokes with significant debility, insulin-dependent diabetes mellitus type 2, hypertension, gastroesophageal reflux disease who presents to [facility] long hospital emergency department from his skilled nursing facility with rapidly progressive weakness.  Patient explains that for approximately the past 2 weeks he has been experiencing progressively worsening right foot pain associated with a right foot ulcer that he has had for approximately past 1-1/2 months.  Patient states that in the past 2 weeks the foot ulcer has become larger and more foul-smelling.  Patient denies any associated fevers.  Patient is not very forthcoming about the characteristics of the pain however but states that it can become severe at times.  Patient symptoms continue to persist in approximately 2 days ago the patient began to develop severe generalized weakness.  This weakness was rapidly progressive.  Patient denies any associated fevers, sick contacts, recent travel, confirmed contact with COVID-19, dysuria, diarrhea or low back pain.  Because of patient's rapidly progressive generalized weakness patient eventually presented to [facility] emergency department for evaluation.  Upon evaluation in the emergency department, patient was found to have acute kidney injury superimposed on his known chronic kidney disease.  Patient was also noted to be suffering from urinary retention.  The hospitalist group was then called to assess patient for admission to the hospital.   Review of Systems:   Review of Systems  Constitutional: Positive for malaise/fatigue.  Musculoskeletal: Positive for joint pain and myalgias.  Neurological:  Positive for weakness.    Past Medical History:  Diagnosis Date  . 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 Anxiety   . Chronic kidney disease    "jsut a little damage"  . Glaucoma   . History of stomach ulcers    not confirmed, never underwent diagnostic endo or radiology, this manifested as epigastric distress.   [name] Hx of adenomatous colonic polyps   . Hypercholesterolemia   . Hypertension   . Peripheral neuropathy   . Polycythemia vera ([place]) 2006  . Renal insufficiency 07/16/2017  . Stroke [facility])    slurred speech  . Substance abuse ([place])   . Type II diabetes mellitus ([place])     Past Surgical History:  Procedure Laterality Date  . COLONOSCOPY WITH PROPOFOL N/A 04/28/2014   Procedure: COLONOSCOPY WITH PROPOFOL;  Surgeon: Gatha Mayer, MD;  Location: [facility];  Service: Endoscopy;  Laterality: N/A;  . EYE SURGERY Right   . IRRIGATION AND DEBRIDEMENT ABSCESS  09/26/2011   Procedure: IRRIGATION AND DEBRIDEMENT ABSCESS;  Surgeon: Imogene Burn. Georgette Dover, MD;  Location: [facility];  Service: General;  Laterality: Left;  Incision and Drainage left breast abscess     reports that he has quit smoking. His smoking use included cigarettes. He has a 8.00 pack-year smoking history. He has never used smokeless tobacco. He reports current alcohol use. He reports current drug use. Drugs: Marijuana and "Crack" cocaine.  Allergies  Allergen Reactions  . Lipitor [Atorvastatin] Other (See Comments)    Sister states had to have Cardiac Surgery    Family History  Problem Relation Age of Onset  . Diabetes Mother   . Hypertension Mother      Prior to Admission medications  Medication Sig Start Date End Date Taking? Authorizing Provider  acetaminophen (TYLENOL) 325 MG tablet Take 650 mg by mouth every 6 (six) hours as needed for mild pain.    [provider]  amLODipine (NORVASC) 5 MG tablet Take 1 tablet (5 mg total) by mouth daily. 12/22/17   [name], NP    amoxicillin (AMOXIL) 500 MG capsule TAKE ONE CAPSULE BY MOUTH 4 TIMES A DAY FOR 3 DAYS THEN DECREASE TO 3 TIMES FOR 7 DAYS 06/09/18   [provider]  aspirin 81 MG chewable tablet Chew 81 mg by mouth daily.    [provider]  atropine 1 % ophthalmic solution Place 1 drop into the right eye 2 (two) times a day. 01/19/18   [provider]  Blood Glucose Monitoring Suppl (TRUE METRIX GO GLUCOSE METER) w/Device KIT USE AS DIRECTED 12/12/16   [provider]  clopidogrel (PLAVIX) 75 MG tablet Take 1 tablet (75 mg total) by mouth daily. 12/22/17   [name], NP  gabapentin (NEURONTIN) 300 MG capsule Take 1 capsule (300 mg total) by mouth 3 (three) times daily. Patient taking differently: Take 300 mg by mouth 2 (two) times a day.  12/22/17 05/27/18  [name], NP  glipiZIDE (GLUCOTROL) 10 MG tablet Take 1 tablet (10 mg total) by mouth 2 (two) times daily before a meal. 12/22/17 05/27/18  [name], NP  glucose blood (TRUE METRIX BLOOD GLUCOSE TEST) test strip 1 each by Other route 3 (three) times daily. 12/22/17   [name], NP  HYDROcodone-acetaminophen (NORCO/VICODIN) 5-325 MG tablet TAKE 1 TABLET BY MOUTH EVERY 6 HOURS AS NEEDED FOR TOOTH PAIN 06/09/18   [provider]  insulin aspart (NOVOLOG) 100 UNIT/ML injection Inject 2-8 Units into the skin daily as needed for high blood sugar.    [provider]  Insulin Glargine (BASAGLAR KWIKPEN) 100 UNIT/ML SOPN Inject 15 Units into the skin daily.    [provider]  lisinopril-hydrochlorothiazide (ZESTORETIC) 20-25 MG tablet Take 1 tablet by mouth daily. 01/09/18   [provider]  Melatonin 5 MG TABS Take 10 mg by mouth at bedtime.    [provider]  Multiple Vitamins-Minerals (CEROVITE PO) Take 1 tablet by mouth daily.    [provider]  pantoprazole (PROTONIX) 40 MG tablet TAKE 1 TABLET(40 MG) BY MOUTH DAILY AT 6 AM Patient taking  differently: Take 40 mg by mouth daily.  12/22/17   [name], NP  prednisoLONE acetate (PRED FORTE) 1 % ophthalmic suspension Place 1 drop into the right eye 4 (four) times daily. 01/19/18   [provider]  rosuvastatin (CRESTOR) 40 MG tablet TAKE 1 TABLET BY MOUTH DAILY AT 6 PM Patient taking differently: Take 40 mg by mouth daily.  12/22/17   [name], NP  sennosides-docusate sodium (SENOKOT-S) 8.6-50 MG tablet Take 1 tablet by mouth daily.    [provider]  traZODone (DESYREL) 50 MG tablet Take 2 tablets (100 mg total) by mouth at bedtime. 02/19/18   Charlott Rakes, MD  TRUEPLUS LANCETS 28G MISC 1 each by Does not apply route 3 (three) times daily. 12/22/17   [name], NP    Physical Exam: Vitals:   10/21/19 0200 10/21/19 0230 10/21/19 0437 10/21/19 0705  BP: 133/79 133/69 116/72 112/66  Pulse: (!) 118 (!) 116 (!) 110 (!) 105  Resp: (!) _0 Temp:    98.9 F (37.2 C)  TempSrc:  Oral  SpO2: 98% 97% 99%   Weight:      Height:        Constitutional: Lethargic but arousable, oriented x3.  No associated distress.   Skin: 2 cm circumferential ulceration noted on the right foot with associated foul smell and shallow ulceration without associated drainage.  Notable surrounding redness and induration with associated tenderness.   notable poor skin turgor.   Eyes: Pupils are equally reactive to light.  No evidence of scleral icterus or conjunctival pallor.  ENMT: Dry mucous membranes noted.  Posterior pharynx clear of any exudate or lesions.   Neck: normal, supple, no masses, no thyromegaly.  No evidence of jugular venous distension.   Respiratory: clear to auscultation bilaterally, no wheezing, no crackles. Normal respiratory effort. No accessory muscle use.  Cardiovascular: Tachycardic rate with regular rhythm, no murmurs / rubs / gallops. No extremity edema. 2+ pedal pulses. No carotid bruits.  Chest:   Nontender without crepitus or  deformity.   Back:   Nontender without crepitus or deformity. Abdomen: Abdomen is protuberant but soft and nontender.  No evidence of intra-abdominal masses.  Positive bowel sounds noted in all quadrants.   Musculoskeletal: Exquisite tenderness of the distal right lower extremity, particularly surrounding ulceration of the right foot with notable induration and warmth as mentioned in the skin examination.  No joint deformity upper and lower extremities. Good ROM, no contractures. Normal muscle tone.  Neurologic: Patient is lethargic but arousable and oriented x3.  CN 2-12 grossly intact. Sensation intact.  Patient moving all 4 extremities spontaneously.  Patient is following all commands.  Patient is responsive to verbal stimuli.   Psychiatric: Patient exhibits normal mood with flat affect.  Patient seems to possess insight as to their current situation.     Labs on Admission: I have personally reviewed following labs and imaging studies -   CBC: Recent Labs  Lab 10/21/19 0039 10/21/19 0046  WBC 8.5  --   NEUTROABS 6.4  --   HGB 9.2* 9.2*  HCT 29.4* 27.0*  MCV 84.0  --   PLT 280  --    Basic Metabolic Panel: Recent Labs  Lab 10/21/19 0039 10/21/19 0046  NA 132* 134*  K 4.2 4.2  CL 102 103  CO2 17*  --   GLUCOSE 196* 191*  BUN 51* 49*  CREATININE 4.20* 4.60*  CALCIUM 8.2*  --    GFR: Estimated Creatinine Clearance: 18.9 mL/min (A) (by C-G formula based on SCr of 4.6 mg/dL (H)). Liver Function Tests: Recent Labs  Lab 10/21/19 0039  AST 38  ALT 26  ALKPHOS 135*  BILITOT 0.9  PROT 7.6  ALBUMIN 3.1*   No results for input(s): LIPASE, AMYLASE in the last 168 hours. No results for input(s): AMMONIA in the last 168 hours. Coagulation Profile: No results for input(s): INR, PROTIME in the last 168 hours. Cardiac Enzymes: No results for input(s): CKTOTAL, CKMB, CKMBINDEX, TROPONINI in the last 168 hours. BNP (last 3 results) No results for input(s): PROBNP in the last  8760 hours. HbA1C: No results for input(s): HGBA1C in the last 72 hours. CBG: No results for input(s): GLUCAP in the last 168 hours. Lipid Profile: No results for input(s): CHOL, HDL, LDLCALC, TRIG, CHOLHDL, LDLDIRECT in the last 72 hours. Thyroid Function Tests: No results for input(s): TSH, T4TOTAL, FREET4, T3FREE, THYROIDAB in the last 72 hours. Anemia Panel: No results for input(s): VITAMINB12, FOLATE, FERRITIN, TIBC, IRON, RETICCTPCT in the last 72 hours. Urine analysis:  Component Value Date/Time   COLORURINE YELLOW 10/21/2019 0155   APPEARANCEUR HAZY (A) 10/21/2019 0155   LABSPEC 1.006 10/21/2019 0155   PHURINE 5.0 10/21/2019 0155   GLUCOSEU NEGATIVE 10/21/2019 0155   HGBUR MODERATE (A) 10/21/2019 0155   BILIRUBINUR NEGATIVE 10/21/2019 0155   BILIRUBINUR Negative 07/14/2017 1621   KETONESUR NEGATIVE 10/21/2019 0155   PROTEINUR 30 (A) 10/21/2019 0155   UROBILINOGEN 0.2 07/14/2017 1621   UROBILINOGEN 1.0 04/25/2014 1751   NITRITE NEGATIVE 10/21/2019 0155   LEUKOCYTESUR NEGATIVE 10/21/2019 0155    Radiological Exams on Admission - Personally Reviewed: DG Foot 2 Views Right  Result Date: 10/21/2019 CLINICAL DATA:  Right foot wound EXAM: RIGHT FOOT - 2 VIEW COMPARISON:  None. FINDINGS: Two view radiograph right foot demonstrates a soft tissue defect medial to the first metatarsal head with mild associated soft tissue swelling but no retained radiopaque foreign body or subjacent osseous erosion. Mild first ray bunion deformity. Mild degenerative arthritis at the first metatarsophalangeal joint. No acute fracture or dislocation. Remaining joint spaces are preserved. Vascular calcifications are seen within the soft tissues. Mild right forefoot soft tissue swelling. No ankle effusion. IMPRESSION: Soft tissue ulceration and associated soft tissue swelling. No associated osseous erosion or retained radiopaque foreign body. Electronically Signed   By: Fidela Salisbury MD   On: 10/21/2019  00:44   CT Renal Stone Study  Result Date: 10/21/2019 CLINICAL DATA:  Acute renal failure, hypertension EXAM: CT ABDOMEN AND PELVIS WITHOUT CONTRAST TECHNIQUE: Multidetector CT imaging of the abdomen and pelvis was performed following the standard protocol without IV contrast. COMPARISON:  None. FINDINGS: Lower chest: The visualized lung bases are clear. Cardiac size within normal limits. No pericardial effusion. Hepatobiliary: No focal liver abnormality is seen. No gallstones, gallbladder wall thickening, or biliary dilatation. Pancreas: Unremarkable Spleen: Unremarkable Adrenals/Urinary Tract: There is mild an in bilateral hydronephrosis and mild distention of the bladder which, together, may reflect changes of voluntary urinary retention or bladder outlet obstruction. The kidneys are normal in size and position. No intrarenal or ureteral calculi are identified. Mild bilateral nonspecific perinephric stranding is seen. The adrenal glands are unremarkable. Stomach/Bowel: The stomach, small bowel, and large bowel are unremarkable. Appendix normal. No free intraperitoneal gas or fluid. Vascular/Lymphatic: Mild aortoiliac atherosclerotic calcification without evidence of aneurysm. No pathologic adenopathy within the abdomen and pelvis. Reproductive: The prostate gland is normal in size. Seminal vesicles are unremarkable. Extensive calcification of the vas deferens is seen, a finding that can be seen the setting of longstanding diabetes mellitus. Other: Rectum unremarkable. Musculoskeletal: No acute bone abnormality. IMPRESSION: Mild bilateral hydronephrosis and moderate distention of the bladder suggesting changes of either voluntary retention or bladder outlet obstruction. Aortic Atherosclerosis (ICD10-I70.0). Electronically Signed   By: Fidela Salisbury MD   On: 10/21/2019 04:54    EKG: Personally reviewed.  Rhythm is sinus tachycardia with heart rate of 117 bpm.  No dynamic ST segment changes  appreciated.  Assessment/Plan Principal Problem:   Acute renal failure superimposed on stage 3b chronic kidney disease ([place])   With evidence of bilateral hydronephrosis on CT imaging of the abdomen with bladder distention, postobstructive uropathy is likely contributing to renal injury.  I also believe the patient may also be suffering from relative volume depletion in the setting of right diabetic foot infection.  I have ordered Foley catheter placement, patient continues to exhibit 800 cc of urine via bladder scan even after voiding 500 cc.  Urinalysis reveals no evidence of concurrent urinary tract infection.  Additionally hydrating patient with intravenous isotonic fluids  Strict input and output monitoring  Monitoring renal function and electrolytes with serial chemistries  Attempted to avoid nephrotoxic agents.  Active Problems:   Diabetic infection of right foot [facility])   Patient states that he has had an ulceration of the right foot for at least 6 weeks that has been gradually increasing in size with a 2-week history of progressive warmth, tenderness and foul smell of the ulceration.  Due to my concern for diabetic foot infection I am initiating intravenous antibiotics with cefepime and vancomycin  Wound care consultation ordered, I believe that the patient may benefit from chemical debridement of this wound as well as recommendations on dressings.  X-ray performed in the emergency department does not reveal any evidence of osteomyelitis, will also obtain CT imaging of the area for increase sensitivity.  Patient is not a candidate for MRI unfortunately due to clips in the abdomen from a previous surgery.    Generalized weakness   Secondary to renal injury and underlying infection  Please see assessment and plan above.    Acute urinary retention   Possibly secondary to BPH, will place patient on a trial of Flomax,  Foley catheter has additionally been  ordered.  Please see remainder of assessment and plan above.    Essential hypertension   Continuing home regimen of antihypertensive therapy while monitoring for episodic hypotension considering concurrent infection    Bacterial conjunctivitis of right eye   Per my discussion with the emergency department provider, patient is supposed to be on antibacterial eyedrops for the right eye.  These antibacterial drops are not on the medication reconciliation performed by pharmacy earlier this morning  Staff has unfortunately been unable to locate the skilled nursing facility documents  Nursing to contact patient/facility later today and determine the exact antibacterial agent for the right eye  In the meantime we will place patient on Polytrim eyedrops and continue remainder of patient's usual nonantibacterial eyedrops.    History of CVA (cerebrovascular accident)   Neurologically stable    Hyperlipidemia associated with type 2 diabetes mellitus ([place])   Due to renal injury I have renally dosed the patient's Crestor    GERD without esophagitis    Daily PPI   Code Status:  Full code Family Communication: deferred   Status is: Observation  The patient remains OBS appropriate and will d/c before 2 midnights.  Dispo: The patient is from: SNF              Anticipated d/c is to: SNF              Anticipated d/c date is: 2 days              Patient currently is not medically stable to d/c.        Vernelle Emerald MD [facility] Pager [phone]  If 7PM-7AM, please contact night-coverage [email or web address] Use universal [place] password for that web site. If you do not have the password, please call the hospital operator.  10/21/2019, 7:32 AM

## 2019-10-21 NOTE — ED Notes (Signed)
Pt advised U/A per MD order when possible. Encouraged pt to call for assistance as needed. Urinal and specimen cup at bedside. [employer]

## 2019-10-21 NOTE — Consult Note (Signed)
OPHTHALMOLOGY CONSULT NOTE  Date: 10/21/19 Time: 11:46 AM  Patient Name: Eric Morton  DOB: [birth year] MRN: [id number]  Reason for Consult: Endophthalmitis Right eye  HPI:  This is a 54 y.o. 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 The nidus for the infection appears to have been a suture abscess in the cornea for which patient has a  Corneal ulcer.   Prior to Admission medications   Medication Sig Start Date End Date Taking? Authorizing Provider  acetaminophen (TYLENOL) 325 MG tablet Take 650 mg by mouth every 6 (six) hours as needed for mild pain.    [provider]  amLODipine (NORVASC) 5 MG tablet Take 1 tablet (5 mg total) by mouth daily. 12/22/17   [name], NP  amoxicillin (AMOXIL) 500 MG capsule TAKE ONE CAPSULE BY MOUTH 4 TIMES A DAY FOR 3 DAYS THEN DECREASE TO 3 TIMES FOR 7 DAYS 06/09/18   [provider]  aspirin 81 MG chewable tablet Chew 81 mg by mouth daily.    [provider]  atropine 1 % ophthalmic solution Place 1 drop into the right eye 2 (two) times a day. 01/19/18   [provider]  Blood Glucose Monitoring Suppl (TRUE METRIX GO GLUCOSE METER) w/Device KIT USE AS DIRECTED 12/12/16   [provider]  clopidogrel (PLAVIX) 75 MG tablet Take 1 tablet (75 mg total) by mouth daily. 12/22/17   [name], NP  gabapentin (NEURONTIN) 300 MG capsule Take 1 capsule (300 mg total) by mouth 3 (three) times daily. Patient taking differently: Take 300 mg by mouth 2 (two) times a day.  12/22/17 05/27/18  [name], NP  glipiZIDE (GLUCOTROL) 10 MG tablet Take 1 tablet (10 mg total) by mouth 2 (two) times daily before a meal. 12/22/17 05/27/18  [name], NP  glucose blood (TRUE METRIX BLOOD GLUCOSE TEST) test strip 1 each by Other route 3 (three) times daily. 12/22/17   [name], NP   HYDROcodone-acetaminophen (NORCO/VICODIN) 5-325 MG tablet TAKE 1 TABLET BY MOUTH EVERY 6 HOURS AS NEEDED FOR TOOTH PAIN 06/09/18   [provider]  insulin aspart (NOVOLOG) 100 UNIT/ML injection Inject 2-8 Units into the skin daily as needed for high blood sugar.    [provider]  Insulin Glargine (BASAGLAR KWIKPEN) 100 UNIT/ML SOPN Inject 15 Units into the skin daily.    [provider]  lisinopril-hydrochlorothiazide (ZESTORETIC) 20-25 MG tablet Take 1 tablet by mouth daily. 01/09/18   [provider]  Melatonin 5 MG TABS Take 10 mg by mouth at bedtime.    [provider]  Multiple Vitamins-Minerals (CEROVITE PO) Take 1 tablet by mouth daily.    [provider]  pantoprazole (PROTONIX) 40 MG tablet TAKE 1 TABLET(40 MG) BY MOUTH DAILY AT 6 AM Patient taking differently: Take 40 mg by mouth daily.  12/22/17   [name], NP  prednisoLONE acetate (PRED FORTE) 1 % ophthalmic suspension Place 1 drop into the right eye 4 (four) times daily. 01/19/18   [provider]  rosuvastatin (CRESTOR) 40 MG tablet TAKE 1 TABLET BY MOUTH DAILY AT 6 PM Patient taking differently: Take 40 mg by mouth daily.  12/22/17   [name], NP  sennosides-docusate sodium (SENOKOT-S) 8.6-50 MG tablet Take 1 tablet by mouth daily.    [provider]  traZODone (DESYREL) 50  MG tablet Take 2 tablets (100 mg total) by mouth at bedtime. 02/19/18   Charlott Rakes, MD  TRUEPLUS LANCETS 28G MISC 1 each by Does not apply route 3 (three) times daily. 12/22/17   [name], NP    Past Medical History:  Diagnosis Date  . 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 Anxiety   . Chronic kidney disease    "jsut a little damage"  . Glaucoma   . History of stomach ulcers    not confirmed, never underwent diagnostic endo or radiology, this manifested as epigastric distress.   [name] Hx of adenomatous colonic polyps   .  Hypercholesterolemia   . Hypertension   . Peripheral neuropathy   . Polycythemia vera ([place]) 2006  . Renal insufficiency 07/16/2017  . Stroke [facility])    slurred speech  . Substance abuse ([place])   . Type II diabetes mellitus (HCC)     family history includes Diabetes in his mother; Hypertension in his mother.  Social History   Occupational History  . Not on file  Tobacco Use  . Smoking status: Former Smoker    Packs/day: 0.25    Years: 32.00    Pack years: 8.00    Types: Cigarettes  . Smokeless tobacco: Never Used  Vaping Use  . Vaping Use: Never used  Substance and Sexual Activity  . Alcohol use: Yes    Comment: 3/14 quit drinking 5 days ago and then drank last nite  . Drug use: Yes    Types: Marijuana, "Crack" cocaine    Comment: snorts and smokes cocaine ocassionally   . Sexual activity: Yes    Allergies  Allergen Reactions  . Lipitor [Atorvastatin] Other (See Comments)    Sister states had to have Cardiac Surgery    ROS: Positive as above, otherwise negative.  EXAM:  Mental Status: A&O x 3  Base Exam: Right Eye   Visual Acuity (At near) NLP   IOP ([place]) 10   Pupillary Exam APD   Motility Full    Confrontation VF N/A    Anterior Segment Exam    Lids/Lashes WNL   Conjuctiva 1+ injection   Cornea 2 mm infiltrate without epi defect   Anterior Chamber Deep, approx 1 mm hypopyon inf.   Iris Mid dilated   Lens Posterior plaque   Vitreous     Poster Segment Exam    Disc No view, known dense vitreitis from prior b-scan   CD ratio    Macula    Vessels    Periphery     Radiographic Studies Reviewed:  DG Foot 2 Views Right  Result Date: 10/21/2019 CLINICAL DATA:  Right foot wound EXAM: RIGHT FOOT - 2 VIEW COMPARISON:  None. FINDINGS: Two view radiograph right foot demonstrates a soft tissue defect medial to the first metatarsal head with mild associated soft tissue swelling but no retained radiopaque foreign body or subjacent osseous erosion. Mild first  ray bunion deformity. Mild degenerative arthritis at the first metatarsophalangeal joint. No acute fracture or dislocation. Remaining joint spaces are preserved. Vascular calcifications are seen within the soft tissues. Mild right forefoot soft tissue swelling. No ankle effusion. IMPRESSION: Soft tissue ulceration and associated soft tissue swelling. No associated osseous erosion or retained radiopaque foreign body. Electronically Signed   By: Fidela Salisbury MD   On: 10/21/2019 00:44   CT Renal Stone Study  Result Date: 10/21/2019 CLINICAL DATA:  Acute renal failure, hypertension EXAM: CT ABDOMEN AND  PELVIS WITHOUT CONTRAST TECHNIQUE: Multidetector CT imaging of the abdomen and pelvis was performed following the standard protocol without IV contrast. COMPARISON:  None. FINDINGS: Lower chest: The visualized lung bases are clear. Cardiac size within normal limits. No pericardial effusion. Hepatobiliary: No focal liver abnormality is seen. No gallstones, gallbladder wall thickening, or biliary dilatation. Pancreas: Unremarkable Spleen: Unremarkable Adrenals/Urinary Tract: There is mild an in bilateral hydronephrosis and mild distention of the bladder which, together, may reflect changes of voluntary urinary retention or bladder outlet obstruction. The kidneys are normal in size and position. No intrarenal or ureteral calculi are identified. Mild bilateral nonspecific perinephric stranding is seen. The adrenal glands are unremarkable. Stomach/Bowel: The stomach, small bowel, and large bowel are unremarkable. Appendix normal. No free intraperitoneal gas or fluid. Vascular/Lymphatic: Mild aortoiliac atherosclerotic calcification without evidence of aneurysm. No pathologic adenopathy within the abdomen and pelvis. Reproductive: The prostate gland is normal in size. Seminal vesicles are unremarkable. Extensive calcification of the vas deferens is seen, a finding that can be seen the setting of longstanding diabetes  mellitus. Other: Rectum unremarkable. Musculoskeletal: No acute bone abnormality. IMPRESSION: Mild bilateral hydronephrosis and moderate distention of the bladder suggesting changes of either voluntary retention or bladder outlet obstruction. Aortic Atherosclerosis (ICD10-I70.0). Electronically Signed   By: Fidela Salisbury MD   On: 10/21/2019 04:54    Assessment and Recommendation: 1. Endophthalmitis OD:   -Stable/improved appearance today  -POD 1 s/p moxifloxacin injection into the anterior chamber (in clinic), Cultures taken  but no growth to date.  Rec- Avalox 400 mg QD given best vitreous penetration  - Needs to continue fortified topical antibiotics  Q2H   - Fortified Vacomycin -25 mg/ml    - Fortified Tobramycin - 15 mg/ml   -Natamycin ophthalmic - Q 2H         #At discharge please give patient all bottles.     -If blood cultures lead to concern for bacteremia or sepsis, pt needs transfer to [facility]  for enucleation/eviceration.   - I will see on Monday in clinic if discharged.   Please call with any questions.  Jola Schmidt MD [facility] [phone]

## 2019-10-21 NOTE — ED Provider Notes (Signed)
[facility] [place] [facility] DEPT Provider Note  CSN: [id number] Arrival date & time: 10/20/19 2342  Chief Complaint(s) No chief complaint on file.  HPI Eric Morton is a 54 y.o. male who presents to the emergency department with generalized fatigue.  Patient called out to EMS after having the symptoms related to standing up.  When EMS arrived they noted patient had blood pressures with systolics in the 49E.  Patient lives in a facility and reports that he is eating well over the past several days.  He denies any fevers or chills.  No nausea or vomiting.  No diarrhea.  No urinary symptoms.  No abdominal pain.  No chest pain or shortness of breath.  Patient has been seen recently by ophthalmology for right eye infection.   In route patient received 500 cc of IV fluids.  He reports that he feels better after it.  HPI  Past Medical History Past Medical History:  Diagnosis Date  . 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 Anxiety   . Chronic kidney disease    "jsut a little damage"  . Glaucoma   . History of stomach ulcers    not confirmed, never underwent diagnostic endo or radiology, this manifested as epigastric distress.   [name] Hx of adenomatous colonic polyps   . Hypercholesterolemia   . Hypertension   . Peripheral neuropathy   . Polycythemia vera ([place]) 2006  . Renal insufficiency 07/16/2017  . Stroke [facility])    slurred speech  . Substance abuse ([place])   . Type II diabetes mellitus [facility])    Patient Active Problem List   Diagnosis Date Noted  . Blisters of multiple sites 11/30/2017  . Renal insufficiency 07/16/2017  . Hemiparesis affecting right side as late effect of stroke ([place]) 10/29/2016  . Left-sided weakness 10/23/2016  . Hyperlipidemia associated with type 2 diabetes mellitus ([place]) 07/27/2015  . History of CVA (cerebrovascular accident)   . Polycythemia vera ([place]) 03/21/2015  . Hx of adenomatous colonic polyps   . Essential hypertension  09/25/2011  . Type II diabetes mellitus ([place]) 09/25/2011  . Cocaine abuse ([employer]) 09/25/2011  . Tobacco use 09/25/2011   Home Medication(s) Prior to Admission medications   Medication Sig Start Date End Date Taking? Authorizing Provider  acetaminophen (TYLENOL) 325 MG tablet Take 650 mg by mouth every 6 (six) hours as needed for mild pain.    [provider]  amLODipine (NORVASC) 5 MG tablet Take 1 tablet (5 mg total) by mouth daily. 12/22/17   [name], NP  amoxicillin (AMOXIL) 500 MG capsule TAKE ONE CAPSULE BY MOUTH 4 TIMES A DAY FOR 3 DAYS THEN DECREASE TO 3 TIMES FOR 7 DAYS 06/09/18   [provider]  aspirin 81 MG chewable tablet Chew 81 mg by mouth daily.    [provider]  atropine 1 % ophthalmic solution Place 1 drop into the right eye 2 (two) times a day. 01/19/18   [provider]  Blood Glucose Monitoring Suppl (TRUE METRIX GO GLUCOSE METER) w/Device KIT USE AS DIRECTED 12/12/16   [provider]  clopidogrel (PLAVIX) 75 MG tablet Take 1 tablet (75 mg total) by mouth daily. 12/22/17   [name], NP  gabapentin (NEURONTIN) 300 MG capsule Take 1 capsule (300 mg total) by mouth 3 (three) times daily. Patient taking differently: Take 300 mg by mouth 2 (two) times a day.  12/22/17 05/27/18  [name], NP  glipiZIDE (  GLUCOTROL) 10 MG tablet Take 1 tablet (10 mg total) by mouth 2 (two) times daily before a meal. 12/22/17 05/27/18  [name], NP  glucose blood (TRUE METRIX BLOOD GLUCOSE TEST) test strip 1 each by Other route 3 (three) times daily. 12/22/17   [name], NP  HYDROcodone-acetaminophen (NORCO/VICODIN) 5-325 MG tablet TAKE 1 TABLET BY MOUTH EVERY 6 HOURS AS NEEDED FOR TOOTH PAIN 06/09/18   [provider]  insulin aspart (NOVOLOG) 100 UNIT/ML injection Inject 2-8 Units into the skin daily as needed for high blood sugar.    [provider]  Insulin Glargine (BASAGLAR KWIKPEN) 100 UNIT/ML  SOPN Inject 15 Units into the skin daily.    [provider]  lisinopril-hydrochlorothiazide (ZESTORETIC) 20-25 MG tablet Take 1 tablet by mouth daily. 01/09/18   [provider]  Melatonin 5 MG TABS Take 10 mg by mouth at bedtime.    [provider]  Multiple Vitamins-Minerals (CEROVITE PO) Take 1 tablet by mouth daily.    [provider]  pantoprazole (PROTONIX) 40 MG tablet TAKE 1 TABLET(40 MG) BY MOUTH DAILY AT 6 AM Patient taking differently: Take 40 mg by mouth daily.  12/22/17   [name], NP  prednisoLONE acetate (PRED FORTE) 1 % ophthalmic suspension Place 1 drop into the right eye 4 (four) times daily. 01/19/18   [provider]  rosuvastatin (CRESTOR) 40 MG tablet TAKE 1 TABLET BY MOUTH DAILY AT 6 PM Patient taking differently: Take 40 mg by mouth daily.  12/22/17   [name], NP  sennosides-docusate sodium (SENOKOT-S) 8.6-50 MG tablet Take 1 tablet by mouth daily.    [provider]  traZODone (DESYREL) 50 MG tablet Take 2 tablets (100 mg total) by mouth at bedtime. 02/19/18   Charlott Rakes, MD  TRUEPLUS LANCETS 28G MISC 1 each by Does not apply route 3 (three) times daily. 12/22/17   [name], NP                                                                                                                                    Past Surgical History Past Surgical History:  Procedure Laterality Date  . COLONOSCOPY WITH PROPOFOL N/A 04/28/2014   Procedure: COLONOSCOPY WITH PROPOFOL;  Surgeon: Gatha Mayer, MD;  Location: [facility];  Service: Endoscopy;  Laterality: N/A;  . EYE SURGERY Right   . IRRIGATION AND DEBRIDEMENT ABSCESS  09/26/2011   Procedure: IRRIGATION AND DEBRIDEMENT ABSCESS;  Surgeon: Imogene Burn. Georgette Dover, MD;  Location: [place];  Service: General;  Laterality: Left;  Incision and Drainage left breast abscess   Family History Family History  Problem Relation Age of Onset  . Diabetes Mother   .  Hypertension Mother     Social History Social History   Tobacco Use  . Smoking status: Former Smoker    Packs/day: 0.25    Years: 32.00  Pack years: 8.00    Types: Cigarettes  . Smokeless tobacco: Never Used  Vaping Use  . Vaping Use: Never used  Substance Use Topics  . Alcohol use: Yes    Comment: 3/14 quit drinking 5 days ago and then drank last nite  . Drug use: Yes    Types: Marijuana, "Crack" cocaine    Comment: snorts and smokes cocaine ocassionally    Allergies Lipitor [atorvastatin]  Review of Systems Review of Systems All other systems are reviewed and are negative for acute change except as noted in the HPI  Physical Exam Vital Signs  I have reviewed the triage vital signs BP 116/72   Pulse (!) 110   Temp 98.1 F (36.7 C) (Oral)   Resp 16   Ht '5\' 8"'  (1.727 m)   Wt 83.9 kg   SpO2 99%   BMI 28.13 kg/m   Physical Exam Vitals reviewed.  Constitutional:      General: He is not in acute distress.    Appearance: He is well-developed. He is not diaphoretic.  HENT:     Head: Normocephalic and atraumatic.     Nose: Nose normal.  Eyes:     General: No scleral icterus.       Right eye: No discharge.        Left eye: No discharge.     Conjunctiva/sclera:     Right eye: Right conjunctiva is injected.     Left eye: Left conjunctiva is not injected. No exudate or hemorrhage.    Pupils: Pupils are equal, round, and reactive to light.     Slit lamp exam:    Right eye: Corneal ulcer and hypopyon present.  Cardiovascular:     Rate and Rhythm: Normal rate and regular rhythm.     Heart sounds: No murmur heard.  No friction rub. No gallop.   Pulmonary:     Effort: Pulmonary effort is normal. No respiratory distress.     Breath sounds: Normal breath sounds. No stridor. No rales.  Abdominal:     General: There is no distension.     Palpations: Abdomen is soft.     Tenderness: There is no abdominal tenderness.  Musculoskeletal:        General: No tenderness.      Cervical back: Normal range of motion and neck supple.       Feet:  Feet:     Right foot:     Skin integrity: Ulcer present.     Left foot:     Skin integrity: Ulcer present.  Skin:    General: Skin is warm and dry.     Findings: No erythema or rash.  Neurological:     Mental Status: He is alert and oriented to person, place, and time.     ED Results and Treatments Labs (all labs ordered are listed, but only abnormal results are displayed) Labs Reviewed  COMPREHENSIVE METABOLIC PANEL - Abnormal; Notable for the following components:      Result Value   Sodium 132 (*)    CO2 17 (*)    Glucose, Bld 196 (*)    BUN 51 (*)    Creatinine, Ser 4.20 (*)    Calcium 8.2 (*)    Albumin 3.1 (*)    Alkaline Phosphatase 135 (*)    GFR calc non Af Amer 15 (*)    GFR calc Af Amer 17 (*)    All other components within normal limits  CBC WITH DIFFERENTIAL/PLATELET - Abnormal;  Notable for the following components:   RBC 3.50 (*)    Hemoglobin 9.2 (*)    HCT 29.4 (*)    All other components within normal limits  URINALYSIS, ROUTINE W REFLEX MICROSCOPIC - Abnormal; Notable for the following components:   APPearance HAZY (*)    Hgb urine dipstick MODERATE (*)    Protein, ur 30 (*)    Bacteria, UA RARE (*)    All other components within normal limits  BLOOD GAS, VENOUS - Abnormal; Notable for the following components:   pCO2, Ven 34.6 (*)    pO2, Ven 53.4 (*)    Bicarbonate 17.7 (*)    Acid-base deficit 7.0 (*)    All other components within normal limits  I-STAT CHEM 8, ED - Abnormal; Notable for the following components:   Sodium 134 (*)    BUN 49 (*)    Creatinine, Ser 4.60 (*)    Glucose, Bld 191 (*)    Calcium, Ion 1.10 (*)    TCO2 19 (*)    Hemoglobin 9.2 (*)    HCT 27.0 (*)    All other components within normal limits  RESPIRATORY PANEL BY RT PCR (FLU A&B, COVID)  URINE CULTURE  LACTIC ACID, PLASMA  LACTIC ACID, PLASMA                                                                                                                          EKG  EKG Interpretation  Date/Time:  Friday October 21 2019 00:30:35 EDT Ventricular Rate:  117 PR Interval:    QRS Duration: 77 QT Interval:  318 QTC Calculation: 444 R Axis:   60 Text Interpretation: Sinus tachycardia Otherwise no significant change Confirmed by Addison Lank [id number]) on 10/21/2019 2:28:37 AM      Radiology DG Foot 2 Views Right  Result Date: 10/21/2019 CLINICAL DATA:  Right foot wound EXAM: RIGHT FOOT - 2 VIEW COMPARISON:  None. FINDINGS: Two view radiograph right foot demonstrates a soft tissue defect medial to the first metatarsal head with mild associated soft tissue swelling but no retained radiopaque foreign body or subjacent osseous erosion. Mild first ray bunion deformity. Mild degenerative arthritis at the first metatarsophalangeal joint. No acute fracture or dislocation. Remaining joint spaces are preserved. Vascular calcifications are seen within the soft tissues. Mild right forefoot soft tissue swelling. No ankle effusion. IMPRESSION: Soft tissue ulceration and associated soft tissue swelling. No associated osseous erosion or retained radiopaque foreign body. Electronically Signed   By: Fidela Salisbury MD   On: 10/21/2019 00:44   CT Renal Stone Study  Result Date: 10/21/2019 CLINICAL DATA:  Acute renal failure, hypertension EXAM: CT ABDOMEN AND PELVIS WITHOUT CONTRAST TECHNIQUE: Multidetector CT imaging of the abdomen and pelvis was performed following the standard protocol without IV contrast. COMPARISON:  None. FINDINGS: Lower chest: The visualized lung bases are clear. Cardiac size within normal limits. No pericardial effusion. Hepatobiliary: No focal liver abnormality is seen. No gallstones, gallbladder wall  thickening, or biliary dilatation. Pancreas: Unremarkable Spleen: Unremarkable Adrenals/Urinary Tract: There is mild an in bilateral hydronephrosis and mild distention of the bladder  which, together, may reflect changes of voluntary urinary retention or bladder outlet obstruction. The kidneys are normal in size and position. No intrarenal or ureteral calculi are identified. Mild bilateral nonspecific perinephric stranding is seen. The adrenal glands are unremarkable. Stomach/Bowel: The stomach, small bowel, and large bowel are unremarkable. Appendix normal. No free intraperitoneal gas or fluid. Vascular/Lymphatic: Mild aortoiliac atherosclerotic calcification without evidence of aneurysm. No pathologic adenopathy within the abdomen and pelvis. Reproductive: The prostate gland is normal in size. Seminal vesicles are unremarkable. Extensive calcification of the vas deferens is seen, a finding that can be seen the setting of longstanding diabetes mellitus. Other: Rectum unremarkable. Musculoskeletal: No acute bone abnormality. IMPRESSION: Mild bilateral hydronephrosis and moderate distention of the bladder suggesting changes of either voluntary retention or bladder outlet obstruction. Aortic Atherosclerosis (ICD10-I70.0). Electronically Signed   By: Fidela Salisbury MD   On: 10/21/2019 04:54    Pertinent labs & imaging results that were available during my care of the patient were reviewed by me and considered in my medical decision making (see chart for details).  Medications Ordered in ED Medications  0.9 %  sodium chloride infusion ( Intravenous New Bag/Given 10/21/19 0037)  sodium chloride 0.9 % bolus 1,000 mL (1,000 mLs Intravenous New Bag/Given 10/21/19 0303)    Followed by  sodium chloride 0.9 % bolus 1,000 mL (1,000 mLs Intravenous New Bag/Given 10/21/19 0303)    Followed by  0.9 %  sodium chloride infusion (has no administration in time range)                                                                                                                                    Procedures Procedures  (including critical care time)  Medical Decision Making / ED Course I have  reviewed the nursing notes for this encounter and the patient's prior records (if available in EHR or on provided paperwork).   Eric Morton was evaluated in Emergency Department on 10/21/2019 for the symptoms described in the history of present illness. He was evaluated in the context of the global COVID-19 pandemic, which necessitated consideration that the patient might be at risk for infection with the SARS-CoV-2 virus that causes COVID-19. Institutional protocols and algorithms that pertain to the evaluation of patients at risk for COVID-19 are in a state of rapid change based on information released by regulatory bodies including the CDC and federal and state organizations. These policies and algorithms were followed during the patient's care in the ED.  Orthostatic symptoms Has eye infection and DM foot ulcers, but denies other infectious symptoms. He AF and normotensive now after 500cc IVF. Screening labs without leukocytosis or evidence obvious infection.  UA negative for infectious process.  He does have hematuria noted.  He is noted to have AKI  worsened from his baseline on metabolic panel.  Given the hematuria, CT was obtained which revealed evidence of urinary retention without calculi.  AKI likely combination of hypovolemia and urinary retention.  Patient was given additional fluid bolus.  Foley inserted.  We will admit patient for close monitoring of his renal function.        Final Clinical Impression(s) / ED Diagnoses Final diagnoses:  Orthostasis  AKI (acute kidney injury) ([place])  Urinary retention     This chart was dictated using voice recognition software.  Despite best efforts to proofread,  errors can occur which can change the documentation meaning.   Fatima Blank, MD 10/21/19 [phone]

## 2019-10-22 DIAGNOSIS — E7800 Pure hypercholesterolemia, unspecified: Secondary | ICD-10-CM | POA: Diagnosis present

## 2019-10-22 DIAGNOSIS — H16001 Unspecified corneal ulcer, right eye: Secondary | ICD-10-CM | POA: Diagnosis present

## 2019-10-22 DIAGNOSIS — M869 Osteomyelitis, unspecified: Secondary | ICD-10-CM | POA: Diagnosis present

## 2019-10-22 DIAGNOSIS — N1330 Unspecified hydronephrosis: Secondary | ICD-10-CM | POA: Diagnosis present

## 2019-10-22 DIAGNOSIS — L97519 Non-pressure chronic ulcer of other part of right foot with unspecified severity: Secondary | ICD-10-CM | POA: Diagnosis present

## 2019-10-22 DIAGNOSIS — Z20822 Contact with and (suspected) exposure to covid-19: Secondary | ICD-10-CM | POA: Diagnosis present

## 2019-10-22 DIAGNOSIS — I129 Hypertensive chronic kidney disease with stage 1 through stage 4 chronic kidney disease, or unspecified chronic kidney disease: Secondary | ICD-10-CM | POA: Diagnosis present

## 2019-10-22 DIAGNOSIS — R338 Other retention of urine: Secondary | ICD-10-CM | POA: Diagnosis not present

## 2019-10-22 DIAGNOSIS — H109 Unspecified conjunctivitis: Secondary | ICD-10-CM | POA: Diagnosis not present

## 2019-10-22 DIAGNOSIS — M86671 Other chronic osteomyelitis, right ankle and foot: Secondary | ICD-10-CM | POA: Diagnosis not present

## 2019-10-22 DIAGNOSIS — K219 Gastro-esophageal reflux disease without esophagitis: Secondary | ICD-10-CM

## 2019-10-22 DIAGNOSIS — R339 Retention of urine, unspecified: Secondary | ICD-10-CM | POA: Diagnosis present

## 2019-10-22 DIAGNOSIS — E1142 Type 2 diabetes mellitus with diabetic polyneuropathy: Secondary | ICD-10-CM | POA: Diagnosis present

## 2019-10-22 DIAGNOSIS — E876 Hypokalemia: Secondary | ICD-10-CM | POA: Diagnosis present

## 2019-10-22 DIAGNOSIS — I69351 Hemiplegia and hemiparesis following cerebral infarction affecting right dominant side: Secondary | ICD-10-CM | POA: Diagnosis not present

## 2019-10-22 DIAGNOSIS — M86071 Acute hematogenous osteomyelitis, right ankle and foot: Secondary | ICD-10-CM | POA: Diagnosis present

## 2019-10-22 DIAGNOSIS — E11621 Type 2 diabetes mellitus with foot ulcer: Secondary | ICD-10-CM | POA: Diagnosis present

## 2019-10-22 DIAGNOSIS — E11628 Type 2 diabetes mellitus with other skin complications: Secondary | ICD-10-CM | POA: Diagnosis not present

## 2019-10-22 DIAGNOSIS — N1832 Chronic kidney disease, stage 3b: Secondary | ICD-10-CM | POA: Diagnosis present

## 2019-10-22 DIAGNOSIS — D631 Anemia in chronic kidney disease: Secondary | ICD-10-CM | POA: Diagnosis present

## 2019-10-22 DIAGNOSIS — Y848 Other medical procedures as the cause of abnormal reaction of the patient, or of later complication, without mention of misadventure at the time of the procedure: Secondary | ICD-10-CM | POA: Diagnosis present

## 2019-10-22 DIAGNOSIS — E1122 Type 2 diabetes mellitus with diabetic chronic kidney disease: Secondary | ICD-10-CM | POA: Diagnosis present

## 2019-10-22 DIAGNOSIS — N179 Acute kidney failure, unspecified: Secondary | ICD-10-CM | POA: Diagnosis present

## 2019-10-22 DIAGNOSIS — Y778 Miscellaneous ophthalmic devices associated with adverse incidents, not elsewhere classified: Secondary | ICD-10-CM | POA: Diagnosis present

## 2019-10-22 DIAGNOSIS — E1169 Type 2 diabetes mellitus with other specified complication: Secondary | ICD-10-CM | POA: Diagnosis present

## 2019-10-22 DIAGNOSIS — M8600 Acute hematogenous osteomyelitis, unspecified site: Secondary | ICD-10-CM | POA: Diagnosis not present

## 2019-10-22 DIAGNOSIS — H1089 Other conjunctivitis: Secondary | ICD-10-CM | POA: Diagnosis present

## 2019-10-22 DIAGNOSIS — E1165 Type 2 diabetes mellitus with hyperglycemia: Secondary | ICD-10-CM | POA: Diagnosis present

## 2019-10-22 DIAGNOSIS — I69328 Other speech and language deficits following cerebral infarction: Secondary | ICD-10-CM | POA: Diagnosis not present

## 2019-10-22 DIAGNOSIS — L089 Local infection of the skin and subcutaneous tissue, unspecified: Secondary | ICD-10-CM | POA: Diagnosis not present

## 2019-10-22 DIAGNOSIS — E785 Hyperlipidemia, unspecified: Secondary | ICD-10-CM | POA: Diagnosis present

## 2019-10-22 DIAGNOSIS — M009 Pyogenic arthritis, unspecified: Secondary | ICD-10-CM | POA: Diagnosis present

## 2019-10-22 DIAGNOSIS — H44001 Unspecified purulent endophthalmitis, right eye: Secondary | ICD-10-CM | POA: Diagnosis present

## 2019-10-22 DIAGNOSIS — E870 Hyperosmolality and hypernatremia: Secondary | ICD-10-CM | POA: Diagnosis present

## 2019-10-22 LAB — MAGNESIUM: Magnesium: 1.6 mg/dL — ABNORMAL LOW (ref 1.7–2.4)

## 2019-10-22 LAB — COMPREHENSIVE METABOLIC PANEL
ALT: 19 U/L (ref 0–44)
AST: 18 U/L (ref 15–41)
Albumin: 2.5 g/dL — ABNORMAL LOW (ref 3.5–5.0)
Alkaline Phosphatase: 104 U/L (ref 38–126)
Anion gap: 12 (ref 5–15)
BUN: 34 mg/dL — ABNORMAL HIGH (ref 6–20)
CO2: 23 mmol/L (ref 22–32)
Calcium: 8.3 mg/dL — ABNORMAL LOW (ref 8.9–10.3)
Chloride: 104 mmol/L (ref 98–111)
Creatinine, Ser: 3.26 mg/dL — ABNORMAL HIGH (ref 0.61–1.24)
GFR calc Af Amer: 23 mL/min — ABNORMAL LOW (ref >60)
GFR calc non Af Amer: 20 mL/min — ABNORMAL LOW (ref >60)
Glucose, Bld: 153 mg/dL — ABNORMAL HIGH (ref 70–99)
Potassium: 3.5 mmol/L (ref 3.5–5.1)
Sodium: 139 mmol/L (ref 135–145)
Total Bilirubin: 0.5 mg/dL (ref 0.3–1.2)
Total Protein: 6.2 g/dL — ABNORMAL LOW (ref 6.5–8.1)

## 2019-10-22 LAB — GLUCOSE, CAPILLARY
Glucose-Capillary: 130 mg/dL — ABNORMAL HIGH (ref 70–99)
Glucose-Capillary: 188 mg/dL — ABNORMAL HIGH (ref 70–99)
Glucose-Capillary: 175 mg/dL — ABNORMAL HIGH (ref 70–99)
Glucose-Capillary: 161 mg/dL — ABNORMAL HIGH (ref 70–99)

## 2019-10-22 LAB — CBC WITH DIFFERENTIAL/PLATELET
Abs Immature Granulocytes: 0.02 10*3/uL (ref 0.00–0.07)
Basophils Absolute: 0 10*3/uL (ref 0.0–0.1)
Basophils Relative: 1 %
Eosinophils Absolute: 0.3 10*3/uL (ref 0.0–0.5)
Eosinophils Relative: 5 %
HCT: 26.8 % — ABNORMAL LOW (ref 39.0–52.0)
Hemoglobin: 8.6 g/dL — ABNORMAL LOW (ref 13.0–17.0)
Immature Granulocytes: 0 %
Lymphocytes Relative: 23 %
Lymphs Abs: 1.4 10*3/uL (ref 0.7–4.0)
MCH: 27 pg (ref 26.0–34.0)
MCHC: 32.1 g/dL (ref 30.0–36.0)
MCV: 84.3 fL (ref 80.0–100.0)
Monocytes Absolute: 0.7 10*3/uL (ref 0.1–1.0)
Monocytes Relative: 10 %
Neutro Abs: 4 10*3/uL (ref 1.7–7.7)
Neutrophils Relative %: 61 %
Platelets: 249 10*3/uL (ref 150–400)
RBC: 3.18 MIL/uL — ABNORMAL LOW (ref 4.22–5.81)
RDW: 12.9 % (ref 11.5–15.5)
WBC: 6.4 10*3/uL (ref 4.0–10.5)
nRBC: 0 % (ref 0.0–0.2)

## 2019-10-22 LAB — PROCALCITONIN: Procalcitonin: 0.67 ng/mL

## 2019-10-22 LAB — CK: Total CK: 128 U/L (ref 49–397)

## 2019-10-22 MED ORDER — SODIUM CHLORIDE 0.9 % IV BOLUS
1000.0000 mL | Freq: Once | INTRAVENOUS | Status: AC
  Administered 2019-10-22: 1000 mL via INTRAVENOUS

## 2019-10-22 MED ORDER — MAGNESIUM SULFATE IN D5W 1-5 GM/100ML-% IV SOLN
1.0000 g | Freq: Once | INTRAVENOUS | Status: AC
  Administered 2019-10-22: 1 g via INTRAVENOUS
  Filled 2019-10-22: qty 100

## 2019-10-22 NOTE — Progress Notes (Signed)
PROGRESS NOTE    Eric Morton  STM:[id number] DOB: Nov 27, 1965 DOA: 10/20/2019 PCP: [name], NP    No chief complaint on file.   Brief Narrative:  54 year old gentleman prior history of stage IIIb CKD, hypertension, hyperlipidemia, type 2 diabetes mellitus, insulin-dependent diabetes, multiple previous strokes with significant debility presents to ED for progressive weakness. 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  Assessment & Plan:   Principal Problem:   Acute renal failure superimposed on stage 3a chronic kidney disease (HCC) Active Problems:   Essential hypertension   Diabetic infection of right foot ([place])   History of CVA (cerebrovascular accident)   Hyperlipidemia associated with type 2 diabetes mellitus ([place])   Generalized weakness   Acute urinary retention   GERD without esophagitis   Bacterial conjunctivitis of right eye   Osteomyelitis (HCC)   Endophthalmitis of the right eye Improving ophthalmology on board and recommended fortified vancomycin, tobramycin and natamycin eyedrops. Recommend outpatient follow-up on discharge.    Right sided diabetic foot ulcer with possible osteomyelitis of the first metatarsal head: -Wound care consulted for dressing changes. -Patient on broad-spectrum IV antibiotics and ID on board for duration. -Orthopedics consulted to see if he is a candidate for debridement/first ray amputation. -Pain control and physical therapy.    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 Patient on Plavix at home was initially held for possible procedure but has been resumed this morning.   Acute on stage IIIa CKD Baseline creatinine between 1.6-2.2, admitted with a creatinine of 4.2 with improvement to 3.8 with fluids.  Foley catheter placed for mild bilateral hydronephrosis evident on  CT. Patient is currently on IV bicarb gtt. Nephrology consulted and appreciate recommendations.   Essential hypertension Hypotension resolved.   Hyperlipidemia Continue with statin   GERD Stable, on PPI.    Diabetes mellitus, insulin-dependent CBG (last 3)  Recent Labs    10/21/19 2126 10/22/19 0749 10/22/19 1148  GLUCAP 178* 130* 188*   Resume sliding scale insulin and Lantus 15 units daily.   Acute urinary retention Foley catheter placed probably secondary to BPH. Continue with Flomax.    DVT prophylaxis: (Lovenox) Code Status: (Full code ) Family Communication: (none at bedside) Disposition:   Status is: Inpatient  Remains inpatient appropriate because:Ongoing diagnostic testing needed not appropriate for outpatient work up, Unsafe d/c plan, IV treatments appropriate due to intensity of illness or inability to take PO and Inpatient level of care appropriate due to severity of illness   Dispo: The patient is from: ALF              Anticipated d/c is to: SNF              Anticipated d/c date is: > 3 days              Patient currently is not medically stable to d/c.       Consultants:   Ophthalmology  ID  Nephrology  Orthopedics.    Procedures:   None.   Antimicrobials:  Antibiotics Given (last 72 hours)    Date/Time Action Medication Dose Rate   10/21/19 0845 New Bag/Given   ceFEPIme (MAXIPIME) 2 g in sodium chloride 0.9 % 100 mL IVPB 2 g 200 mL/hr   10/21/19 1211 New Bag/Given   vancomycin (VANCOCIN) IVPB 1000 mg/200 mL premix 1,000 mg 200  mL/hr   10/22/19 0941 New Bag/Given   ceFEPIme (MAXIPIME) 2 g in sodium chloride 0.9 % 100 mL IVPB 2 g 200 mL/hr          Subjective: No chest pain or shortness of breath  Objective: Vitals:   10/22/19 0955 10/22/19 1000 10/22/19 1100 10/22/19 1252  BP: (!) 76/60 (!) 91/55 122/72   Pulse:   98   Resp:   15   Temp:    97.8 F (36.6 C)  TempSrc:      SpO2:   100%   Weight:       Height:        Intake/Output Summary (Last 24 hours) at 10/22/2019 1456 Last data filed at 10/22/2019 1230 Gross per 24 hour  Intake 3260 ml  Output 4500 ml  Net -1240 ml   Filed Weights   10/20/19 2354  Weight: 83.9 kg    Examination:  General exam: Appears calm and comfortable  Respiratory system: Clear to auscultation. Respiratory effort normal. Cardiovascular system: S1 & S2 heard, RRR. No JVD,  No pedal edema. Gastrointestinal system: Abdomen is nondistended, soft and nontender.Normal bowel sounds heard. Central nervous system: Alert and oriented.  Speech abnormality Extremities: Right sided soft tissue ulcer at the first metatarsal area Skin: No rashes seen Psychiatry:  Mood & affect appropriate.     Data Reviewed: I have personally reviewed following labs and imaging studies  CBC: Recent Labs  Lab 10/21/19 0039 10/21/19 0046 10/22/19 0521  WBC 8.5  --  6.4  NEUTROABS 6.4  --  4.0  HGB 9.2* 9.2* 8.6*  HCT 29.4* 27.0* 26.8*  MCV 84.0  --  84.3  PLT 280  --  222    Basic Metabolic Panel: Recent Labs  Lab 10/21/19 0039 10/21/19 0046 10/21/19 0724 10/22/19 0521  NA 132* 134* 137 139  K 4.2 4.2 3.9 3.5  CL 102 103 107 104  CO2 17*  --  17* 23  GLUCOSE 196* 191* 164* 153*  BUN 51* 49* 44* 34*  CREATININE 4.20* 4.60* 3.87* 3.26*  CALCIUM 8.2*  --  7.6* 8.3*  MG  --   --   --  1.6*    GFR: Estimated Creatinine Clearance: 26.7 mL/min (A) (by C-G formula based on SCr of 3.26 mg/dL (H)).  Liver Function Tests: Recent Labs  Lab 10/21/19 0039 10/21/19 0724 10/22/19 0521  AST 38 24 18  ALT 26 21 19   ALKPHOS 135* 118 104  BILITOT 0.9 0.7 0.5  PROT 7.6 6.3* 6.2*  ALBUMIN 3.1* 2.6* 2.5*    CBG: Recent Labs  Lab 10/21/19 1153 10/21/19 1703 10/21/19 2126 10/22/19 0749 10/22/19 1148  GLUCAP 147* 229* 178* 130* 188*     Recent Results (from the past 240 hour(s))  Urine culture     Status: Abnormal (Preliminary result)   Collection Time:  10/21/19  1:55 AM   Specimen: In/Out Cath Urine  Result Value Ref Range Status   Specimen Description   Final    IN/OUT CATH URINE Performed at [facility], [place] [address] [place], [place] [address]    Special Requests   Final    NONE Performed at [facility], [place] [address] [place], [place] [address]    Culture (A)  Final    4,000 COLONIES/mL ESCHERICHIA COLI SUSCEPTIBILITIES TO FOLLOW Performed at [facility], [place] [address], [place], [place] [address]    Report Status PENDING  Incomplete  Respiratory Panel by  RT PCR (Flu A&B, Covid) - Nasopharyngeal Swab     Status: None   Collection Time: 10/21/19  2:53 AM   Specimen: Nasopharyngeal Swab  Result Value Ref Range Status   SARS Coronavirus 2 by RT PCR NEGATIVE NEGATIVE Final    Comment: (NOTE) SARS-CoV-2 target nucleic acids are NOT DETECTED.  The SARS-CoV-2 RNA is generally detectable in upper respiratoy specimens during the acute phase of infection. The lowest concentration of SARS-CoV-2 viral copies this assay can detect is 131 copies/mL. A negative result does not preclude SARS-Cov-2 infection and should not be used as the sole basis for treatment or other patient management decisions. A negative result may occur with  improper specimen collection/handling, submission of specimen other than nasopharyngeal swab, presence of viral mutation(s) within the areas targeted by this assay, and inadequate number of viral copies (<131 copies/mL). A negative result must be combined with clinical observations, patient history, and epidemiological information. The expected result is Negative.  Fact Sheet for Patients:  PinkCheek.be  Fact Sheet for Healthcare Providers:  GravelBags.it  This test is no t yet approved or cleared by the [place] FDA and  has been authorized for detection and/or diagnosis of SARS-CoV-2 by FDA  under an Emergency Use Authorization (EUA). This EUA will remain  in effect (meaning this test can be used) for the duration of the COVID-19 declaration under Section 564(b)(1) of the Act, 21 U.S.C. section 360bbb-3(b)(1), unless the authorization is terminated or revoked sooner.     Influenza A by PCR NEGATIVE NEGATIVE Final   Influenza B by PCR NEGATIVE NEGATIVE Final    Comment: (NOTE) The Xpert Xpress SARS-CoV-2/FLU/RSV assay is intended as an aid in  the diagnosis of influenza from Nasopharyngeal swab specimens and  should not be used as a sole basis for treatment. Nasal washings and  aspirates are unacceptable for Xpert Xpress SARS-CoV-2/FLU/RSV  testing.  Fact Sheet for Patients: PinkCheek.be  Fact Sheet for Healthcare Providers: GravelBags.it  This test is not yet approved or cleared by the [place] FDA and  has been authorized for detection and/or diagnosis of SARS-CoV-2 by  FDA under an Emergency Use Authorization (EUA). This EUA will remain  in effect (meaning this test can be used) for the duration of the  Covid-19 declaration under Section 564(b)(1) of the Act, 21  U.S.C. section 360bbb-3(b)(1), unless the authorization is  terminated or revoked. Performed at [facility], [place] [address], [place], [place] [address]   Culture, blood (routine x 2)     Status: None (Preliminary result)   Collection Time: 10/21/19  7:24 AM   Specimen: BLOOD LEFT HAND  Result Value Ref Range Status   Specimen Description   Final    BLOOD LEFT HAND Performed at [place] [address], [place], [place] [address]    Special Requests   Final    BOTTLES DRAWN AEROBIC ONLY Blood Culture adequate volume Performed at [place] [address] [place], [place] [address]    Culture   Final    NO GROWTH 1 DAY Performed at [facility], [place] [address]  [place], [place] [address]    Report Status PENDING  Incomplete  Culture, blood (routine x 2)     Status: None (Preliminary result)   Collection Time: 10/21/19  7:24 AM   Specimen: BLOOD  Result Value Ref Range Status   Specimen Description   Final    BLOOD LEFT THUMB Performed at  [facility], [place] [address], [place], [place] [address]    Special Requests   Final    BOTTLES DRAWN AEROBIC ONLY Blood Culture adequate volume Performed at [place] [address] [place], [place] [address]    Culture   Final    NO GROWTH 1 DAY Performed at [facility], [place] [address], [employer] [place], [place] [address]    Report Status PENDING  Incomplete  C Difficile Quick Screen w PCR reflex     Status: None   Collection Time: 10/21/19 10:42 AM   Specimen: STOOL  Result Value Ref Range Status   C Diff antigen NEGATIVE NEGATIVE Final   C Diff toxin NEGATIVE NEGATIVE Final   C Diff interpretation No C. difficile detected.  Final    Comment: Performed at [facility], [place] [address], [place], [place] [address]         Radiology Studies: CT FOOT RIGHT WO CONTRAST  Result Date: 10/21/2019 CLINICAL DATA:  Diabetic with bilateral foot wounds, worsening on the right. Clinical concern for osteomyelitis. EXAM: CT OF THE RIGHT FOOT WITHOUT CONTRAST TECHNIQUE: Multidetector CT imaging of the right foot was performed according to the standard protocol. Multiplanar CT image reconstructions were also generated. COMPARISON:  Radiographs 10/21/2019.  No other comparison studies. FINDINGS: Bones/Joint/Cartilage There is soft tissue ulceration medial to the 1st metatarsal head, as seen on earlier radiographs. There is cortical irregularity of the adjacent 1st metatarsal head medially with bony fragmentation and possible osteolysis, suspicious for osteomyelitis. There is underlying joint space narrowing, small erosions and osteophytes within the 1st metatarsal  head and its sesamoids. No other findings suspicious for osteomyelitis. There is no evidence of acute fracture or dislocation. The bones are mildly demineralized. There are mild talonavicular degenerative changes. Probable effusion of the 1st metatarsophalangeal joint. No other significant effusions identified. Ligaments Suboptimally assessed by CT. Muscles and Tendons Grossly intact ankle tendons without significant tenosynovitis. Mild generalized muscular atrophy without focal fluid collection. Soft tissues As above, soft tissue ulceration medial to 1st metatarsal head with underlying soft tissue swelling and an effusion of the 1st MTP joint. No focal fluid collection, soft tissue emphysema or foreign body seen. Prominent vascular calcifications are noted, consistent with diabetes. IMPRESSION: 1. Soft tissue ulceration medial to the 1st metatarsal head with underlying cortical irregularity and possible osteolysis, suspicious for osteomyelitis. 2. Underlying degenerative changes at the 1st MTP joint with nonspecific joint effusion. 3. No evidence of soft tissue emphysema or foreign body. Electronically Signed   By: Richardean Sale M.D.   On: 10/21/2019 15:55   DG Foot 2 Views Right  Result Date: 10/21/2019 CLINICAL DATA:  Right foot wound EXAM: RIGHT FOOT - 2 VIEW COMPARISON:  None. FINDINGS: Two view radiograph right foot demonstrates a soft tissue defect medial to the first metatarsal head with mild associated soft tissue swelling but no retained radiopaque foreign body or subjacent osseous erosion. Mild first ray bunion deformity. Mild degenerative arthritis at the first metatarsophalangeal joint. No acute fracture or dislocation. Remaining joint spaces are preserved. Vascular calcifications are seen within the soft tissues. Mild right forefoot soft tissue swelling. No ankle effusion. IMPRESSION: Soft tissue ulceration and associated soft tissue swelling. No associated osseous erosion or retained radiopaque  foreign body. Electronically Signed   By: Fidela Salisbury MD   On: 10/21/2019 00:44   CT Renal Stone Study  Result Date: 10/21/2019 CLINICAL DATA:  Acute renal failure, hypertension EXAM: CT ABDOMEN AND  PELVIS WITHOUT CONTRAST TECHNIQUE: Multidetector CT imaging of the abdomen and pelvis was performed following the standard protocol without IV contrast. COMPARISON:  None. FINDINGS: Lower chest: The visualized lung bases are clear. Cardiac size within normal limits. No pericardial effusion. Hepatobiliary: No focal liver abnormality is seen. No gallstones, gallbladder wall thickening, or biliary dilatation. Pancreas: Unremarkable Spleen: Unremarkable Adrenals/Urinary Tract: There is mild an in bilateral hydronephrosis and mild distention of the bladder which, together, may reflect changes of voluntary urinary retention or bladder outlet obstruction. The kidneys are normal in size and position. No intrarenal or ureteral calculi are identified. Mild bilateral nonspecific perinephric stranding is seen. The adrenal glands are unremarkable. Stomach/Bowel: The stomach, small bowel, and large bowel are unremarkable. Appendix normal. No free intraperitoneal gas or fluid. Vascular/Lymphatic: Mild aortoiliac atherosclerotic calcification without evidence of aneurysm. No pathologic adenopathy within the abdomen and pelvis. Reproductive: The prostate gland is normal in size. Seminal vesicles are unremarkable. Extensive calcification of the vas deferens is seen, a finding that can be seen the setting of longstanding diabetes mellitus. Other: Rectum unremarkable. Musculoskeletal: No acute bone abnormality. IMPRESSION: Mild bilateral hydronephrosis and moderate distention of the bladder suggesting changes of either voluntary retention or bladder outlet obstruction. Aortic Atherosclerosis (ICD10-I70.0). Electronically Signed   By: Fidela Salisbury MD   On: 10/21/2019 04:54        Scheduled Meds: . aspirin  81 mg Oral Daily    . atropine  1 drop Right Eye BID  . Chlorhexidine Gluconate Cloth  6 each Topical Daily  . clopidogrel  75 mg Oral Daily  . enoxaparin (LOVENOX) injection  30 mg Subcutaneous Q24H  . gabapentin  300 mg Oral BID  . insulin aspart  0-15 Units Subcutaneous TID AC & HS  . insulin glargine  15 Units Subcutaneous Daily  . melatonin  10 mg Oral QHS  . natamycin  1 drop Right Eye Q2H while awake  . pantoprazole  40 mg Oral QPM  . prednisoLONE acetate  1 drop Right Eye QID  . rosuvastatin  10 mg Oral QHS  . senna-docusate  1 tablet Oral Daily  . tamsulosin  0.4 mg Oral QPC breakfast  . tobramycin 15mg /mL (fortified) compounded eye drops  1 drop Right Eye Q2H  . traZODone  100 mg Oral QHS  . trimethoprim-polymyxin b  2 drop Right Eye Q6H  . Vancomycin 25mg /mL (fortified) compounded eye drops  1 drop Right Eye Q2H   Continuous Infusions: . ceFEPime (MAXIPIME) IV 2 g (10/22/19 0941)  . DAPTOmycin (CUBICIN)  IV    .  sodium bicarbonate (isotonic) infusion in sterile water 125 mL/hr at 10/22/19 1008     LOS: 0 days        Hosie Poisson, MD [facility]   To contact the attending provider between 7A-7P or the covering provider during after hours 7P-7A, please log into the web site [email or web address] and access using universal [place] password for that web site. If you do not have the password, please call the hospital operator.  10/22/2019, 2:56 PM

## 2019-10-22 NOTE — Progress Notes (Signed)
[facility] Infectious Diseases Follow Up Note  Patient Identification: Patient Name: Eric Morton MRN: [id number] Admit Date: 10/20/2019 11:42 PM Age: 54 y.o.Today's Date: 10/22/2019   Reason for Visit: Follow  Up on osteomyelitis   Principal Problem:   Acute renal failure superimposed on stage 3b chronic kidney disease (HCC) Active Problems:   Essential hypertension   Diabetic infection of right foot (HCC)   History of CVA (cerebrovascular accident)   Hyperlipidemia associated with type 2 diabetes mellitus ([place])   Generalized weakness   Acute urinary retention   GERD without esophagitis   Bacterial conjunctivitis of right eye   Osteomyelitis (HCC)   Antibiotics:  Vancomycin Day 2                    Cefepime Day 2  Assessment 54 Year old male with a PMH of DM, CKD stage 3, CVA with significant debility, HTN admitted on 9/24 with progressive generalised weakness,  Rt foot ulcer for approx 1-1/2 month with worsening pain for 2 weeks. Found to have AKI on CKD on admission. Patient also has endopthalmistis of Rt eye and is being managed by Ophthalmology. 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 Please send for routine aerobic/anaerobic, fungal and AFB stain and cultures + Path if he goes to OR Vascular eval  Follow Up optho recs  No new antibiotic recommendations Monitor CBC, CMP while on IV abx  Baseline ESR ( ordered)  Rest of the management as per the primary team. Thank you for the consult. Please page with pertinent questions or concerns.  Rosiland Oz, MD Infectious [place]   To contact the attending provider between 8A-5P or the covering provider during after hours 5P-8A, please log into the web site [email or web address] and access using universal [place] password for that web site. If you do not have the password, please call the hospital operator. ______________________________________________________________________ Subjective patient seen and examined at the bedside. He complains of pain at the RT foot. Denies any side effects with the antibiotics.   Objective BP 122/72    Pulse 98    Temp 97.8 F (36.6 C)    Resp 15    Ht '5\' 8"'  (1.727 m)    Wt 83.9 kg    SpO2 100%    BMI 28.13 kg/m     PHYSICAL EXAM Gen: Alert and oriented x 3, not in acute distress HEENT: [place]/AT, PERLA, no scleral icterus, no pale conjunctivae, hearing normal, mucosa moist, RT EYE IS CLOSED  Neck: Supple, no lymphadenopathy Cardio: RRR, +S1 and S2, No murmur Resp: CTAB; no wheezes, rhonchi, or rales GI: Soft, nontender, nondistended, bowel sounds + Musc: No joint swelling or tenderness Extremities:   Skin: No rashes, lesions, or ecchymoses  Psych: Calm, cooperative  Microbiology Results for orders placed or performed during the hospital encounter of 10/20/19  Urine culture     Status: Abnormal (Preliminary result)   Collection Time: 10/21/19  1:55 AM   Specimen: In/Out Cath Urine  Result Value Ref Range Status   Specimen Description   Final    IN/OUT CATH URINE Performed at [facility] [address] [place], [place]  [address]    Special Requests   Final    NONE Performed at [facility], [place] [address], [place], [place] [address]    Culture (A)  Final    4,000 COLONIES/mL ESCHERICHIA COLI SUSCEPTIBILITIES TO FOLLOW Performed at [facility], [place] [address], [place], [place] [address]    Report Status PENDING  Incomplete  Respiratory Panel by RT PCR (Flu A&B, Covid) - Nasopharyngeal Swab      Status: None   Collection Time: 10/21/19  2:53 AM   Specimen: Nasopharyngeal Swab  Result Value Ref Range Status   SARS Coronavirus 2 by RT PCR NEGATIVE NEGATIVE Final    Comment: (NOTE) SARS-CoV-2 target nucleic acids are NOT DETECTED.  The SARS-CoV-2 RNA is generally detectable in upper respiratoy specimens during the acute phase of infection. The lowest concentration of SARS-CoV-2 viral copies this assay can detect is 131 copies/mL. A negative result does not preclude SARS-Cov-2 infection and should not be used as the sole basis for treatment or other patient management decisions. A negative result may occur with  improper specimen collection/handling, submission of specimen other than nasopharyngeal swab, presence of viral mutation(s) within the areas targeted by this assay, and inadequate number of viral copies (<131 copies/mL). A negative result must be combined with clinical observations, patient history, and epidemiological information. The expected result is Negative.  Fact Sheet for Patients:  PinkCheek.be  Fact Sheet for Healthcare Providers:  GravelBags.it  This test is no t yet approved or cleared by the [place] FDA and  has been authorized for detection and/or diagnosis of SARS-CoV-2 by FDA under an Emergency Use Authorization (EUA). This EUA will remain  in effect (meaning this test can be used) for the duration of the COVID-19 declaration under Section 564(b)(1) of the Act, 21 U.S.C. section 360bbb-3(b)(1), unless the authorization is terminated or revoked sooner.     Influenza A by PCR NEGATIVE NEGATIVE Final   Influenza B by PCR NEGATIVE NEGATIVE Final    Comment: (NOTE) The Xpert Xpress SARS-CoV-2/FLU/RSV assay is intended as an aid in  the diagnosis of influenza from Nasopharyngeal swab specimens and  should not be used as a sole basis for treatment. Nasal washings and  aspirates are  unacceptable for Xpert Xpress SARS-CoV-2/FLU/RSV  testing.  Fact Sheet for Patients: PinkCheek.be  Fact Sheet for Healthcare Providers: GravelBags.it  This test is not yet approved or cleared by the [place] FDA and  has been authorized for detection and/or diagnosis of SARS-CoV-2 by  FDA under an Emergency Use Authorization (EUA). This EUA will remain  in effect (meaning this test can be used) for the duration of the  Covid-19 declaration under Section 564(b)(1) of the Act, 21  U.S.C. section 360bbb-3(b)(1), unless the authorization is  terminated or revoked. Performed at [facility], [place] [address], [place], [place] [address]   Culture, blood (routine x 2)     Status: None (Preliminary result)   Collection Time: 10/21/19  7:24 AM   Specimen: BLOOD LEFT HAND  Result Value Ref Range Status   Specimen Description   Final    BLOOD LEFT HAND Performed at [facility] [address] [place], [place] [address]    Special Requests   Final    BOTTLES DRAWN AEROBIC ONLY Blood Culture adequate volume Performed at [facility] [address], [place],  [address]    Culture   Final    NO GROWTH 1 DAY  Performed at [facility], [place] [address] [place], [place] [address]    Report Status PENDING  Incomplete  Culture, blood (routine x 2)     Status: None (Preliminary result)   Collection Time: 10/21/19  7:24 AM   Specimen: BLOOD  Result Value Ref Range Status   Specimen Description   Final    BLOOD LEFT THUMB Performed at [facility] [address] [place], [place] [address]    Special Requests   Final    BOTTLES DRAWN AEROBIC ONLY Blood Culture adequate volume Performed at [facility] [address], [place], [place] [address]    Culture   Final    NO GROWTH 1 DAY Performed at [facility],  [place] [address], [place], [place] [address]    Report Status PENDING  Incomplete  C Difficile Quick Screen w PCR reflex     Status: None   Collection Time: 10/21/19 10:42 AM   Specimen: STOOL  Result Value Ref Range Status   C Diff antigen NEGATIVE NEGATIVE Final   C Diff toxin NEGATIVE NEGATIVE Final   C Diff interpretation No C. difficile detected.  Final    Comment: Performed at [facility], [place] [address] [place], [place] [address]      Pertinent Lab. CBC Latest Ref Rng & Units 10/22/2019 10/21/2019 10/21/2019  WBC 4.0 - 10.5 K/uL 6.4 - 8.5  Hemoglobin 13.0 - 17.0 g/dL 8.6(L) 9.2(L) 9.2(L)  Hematocrit 39 - 52 % 26.8(L) 27.0(L) 29.4(L)  Platelets 150 - 400 K/uL 249 - 280   CMP Latest Ref Rng & Units 10/22/2019 10/21/2019 10/21/2019  Glucose 70 - 99 mg/dL 153(H) 164(H) 191(H)  BUN 6 - 20 mg/dL 34(H) 44(H) 49(H)  Creatinine 0.61 - 1.24 mg/dL 3.26(H) 3.87(H) 4.60(H)  Sodium 135 - 145 mmol/L 139 137 134(L)  Potassium 3.5 - 5.1 mmol/L 3.5 3.9 4.2  Chloride 98 - 111 mmol/L 104 107 103  CO2 22 - 32 mmol/L 23 17(L) -  Calcium 8.9 - 10.3 mg/dL 8.3(L) 7.6(L) -  Total Protein 6.5 - 8.1 g/dL 6.2(L) 6.3(L) -  Total Bilirubin 0.3 - 1.2 mg/dL 0.5 0.7 -  Alkaline Phos 38 - 126 U/L 104 118 -  AST 15 - 41 U/L 18 24 -  ALT 0 - 44 U/L 19 21 -    Pertinent Imaging today Plain films and CT images have been personally visualized and interpreted; radiology reports have been reviewed. Decision making incorporated into the Impression / Recommendations.  Xray Rt foot 10/21/19 FINDINGS: Two view radiograph right foot demonstrates a soft tissue defect medial to the first metatarsal head with mild associated soft tissue swelling but no retained radiopaque foreign body or subjacent osseous erosion.  Mild first ray bunion deformity. Mild degenerative arthritis at the first metatarsophalangeal joint. No acute fracture or dislocation. Remaining joint spaces are preserved. Vascular  calcifications are seen within the soft tissues. Mild right forefoot soft tissue swelling. No ankle effusion.  IMPRESSION: Soft tissue ulceration and associated soft tissue swelling. NoNo associated osseous erosion or retained radiopaque foreign body.  CT Rt Foot WO contrast 10/21/19  FINDINGS: Bones/Joint/Cartilage  There is soft tissue ulceration medial to the 1st metatarsal head, as seen on earlier radiographs. There is cortical irregularity of the adjacent 1st metatarsal head medially with bony fragmentation and possible osteolysis, suspicious for osteomyelitis. There is underlying joint space narrowing, small erosions and osteophytes within the 1st metatarsal head and its sesamoids.  No other findings suspicious for osteomyelitis. There is no evidence of acute fracture or dislocation. The bones are mildly demineralized. There are mild talonavicular degenerative changes. Probable effusion of the 1st metatarsophalangeal joint. No other significant effusions identified.  Ligaments  Suboptimally assessed by CT.  Muscles and Tendons  Grossly intact ankle tendons without significant tenosynovitis. Mild generalized muscular atrophy without focal fluid collection.  Soft tissues  As above, soft tissue ulceration medial to 1st metatarsal head with underlying soft tissue swelling and an effusion of the 1st MTP joint. No focal fluid collection, soft tissue emphysema or foreign body seen. Prominent vascular calcifications are noted, consistent with diabetes.  IMPRESSION: 1. Soft tissue ulceration medial to the 1st metatarsal head with underlying cortical irregularity and possible osteolysis, suspicious for osteomyelitis. 2. Underlying degenerative changes at the 1st MTP joint with nonspecific joint effusion. 3. No evidence of soft tissue emphysema or foreign body.

## 2019-10-22 NOTE — Evaluation (Signed)
Physical Therapy Evaluation Patient Details Name: Eric Morton MRN: [id number] DOB: [birth year] Today's Date: 10/22/2019   History of Present Illness  54 year old gentleman prior history of stage IIIb CKD, hypertension, hyperlipidemia, type 2 diabetes mellitus, insulin-dependent diabetes, multiple previous strokes with significant debility presents to ED for progressive weakness. On arrival he was found to be in AKI superimposed on stage IIIb CKD, endophthalmitis of his right eye and worsening diabetic foot ulcer on the right  Clinical Impression  Pt admitted with above diagnosis. Min assist for sit to stand, pt became dizzy in standing but was unable to stand long enough to obtain a BP reading. Assisted pt to recliner where seated BP was 76/60, then 3 minutes later in reclined position it was 91/55. RN and MD notified of orthostatic hypotension. Pt is very pleasant and puts forth good effort. He walks with a RW independently at his ALF at baseline, but will likely require SNF level of care upon DC.  Pt currently with functional limitations due to the deficits listed below (see PT Problem List). Pt will benefit from skilled PT to increase their independence and safety with mobility to allow discharge to the venue listed below.       Follow Up Recommendations SNF;Supervision/Assistance - 24 hour;Supervision for mobility/OOB    Equipment Recommendations  None recommended by PT    Recommendations for Other Services       Precautions / Restrictions Precautions Precautions: Fall;Other (comment) Precaution Comments: 1 fall 6 weeks ago 2* RW "gave away"; orthostatic, dizzy in standing Restrictions Weight Bearing Restrictions: No      Mobility  Bed Mobility Overal bed mobility: Modified Independent             General bed mobility comments: HOB up, increased time and effort  Transfers Overall transfer level: Needs assistance Equipment used: Rolling walker (2 wheeled) Transfers: Sit  to/from [facility] Sit to Stand: Min assist;From elevated surface Stand pivot transfers: Min assist       General transfer comment: min A to power up, VCs hand placement; sit to stand x 2, pt dizzy in standing but unable to stand long enough to get BP, assisted to recliner where sitting BP was 76/60, reclined pt and checked BP again ~3 minutes later it was 91/55, RN and Dr Karleen Hampshire notified of orthostatic hypotension.  Ambulation/Gait                Stairs            Wheelchair Mobility    Modified Rankin (Stroke Patients Only)       Balance Overall balance assessment: Needs assistance Sitting-balance support: Feet supported;Single extremity supported Sitting balance-Leahy Scale: Fair     Standing balance support: Bilateral upper extremity supported Standing balance-Leahy Scale: Poor                               Pertinent Vitals/Pain Pain Assessment: Faces Faces Pain Scale: Hurts even more Pain Location: penis with urination Pain Descriptors / Indicators: Grimacing Pain Intervention(s): Limited activity within patient's tolerance;Monitored during session    Home Living Family/patient expects to be discharged to:: Assisted living               Home Equipment: Gilford Rile - 2 wheels Additional Comments: pt has been at an ALF since June 2021, may need SNF depending on progress    Prior Function  Comments: walks with RW, able to do most of bathing/dressing, he receives assistance to wash his back at ALF, stated he can walk to the dining room     Hand Dominance   Dominant Hand: Right    Extremity/Trunk Assessment   Upper Extremity Assessment Upper Extremity Assessment: Defer to OT evaluation    Lower Extremity Assessment Lower Extremity Assessment: Overall WFL for tasks assessed (dressed wounds on B feet; pt reports sensation intact to light touch B feet, B knee ext 5/5)    Cervical / Trunk  Assessment Cervical / Trunk Assessment: Normal  Communication   Communication: Expressive difficulties (increased time to get words out)  Cognition Arousal/Alertness: Awake/alert Behavior During Therapy: WFL for tasks assessed/performed Overall Cognitive Status: Within Functional Limits for tasks assessed                                 General Comments: able to follow commands, oriented, increased time to get words out when speaking      General Comments      Exercises     Assessment/Plan    PT Assessment Patient needs continued PT services  PT Problem List Decreased activity tolerance;Decreased balance;Decreased mobility       PT Treatment Interventions Gait training;Functional mobility training;Therapeutic activities;Therapeutic exercise;Patient/family education    PT Goals (Current goals can be found in the Care Plan section)  Acute Rehab PT Goals Patient Stated Goal: be able to walk, not get dizzy PT Goal Formulation: With patient Time For Goal Achievement: 11/05/19 Potential to Achieve Goals: Good    Frequency Min 2X/week   Barriers to discharge        Co-evaluation               AM-PAC PT "6 Clicks" Mobility  Outcome Measure Help needed turning from your back to your side while in a flat bed without using bedrails?: A Little Help needed moving from lying on your back to sitting on the side of a flat bed without using bedrails?: A Little Help needed moving to and from a bed to a chair (including a wheelchair)?: A Little Help needed standing up from a chair using your arms (e.g., wheelchair or bedside chair)?: A Little Help needed to walk in hospital room?: Total Help needed climbing 3-5 steps with a railing? : Total 6 Click Score: 14    End of Session Equipment Utilized During Treatment: Gait belt Activity Tolerance: Treatment limited secondary to medical complications (Comment) (orthostatic) Patient left: in chair;with call bell/phone  within reach;with chair alarm set Nurse Communication: Mobility status PT Visit Diagnosis: Unsteadiness on feet (R26.81);History of falling (Z91.81);Difficulty in walking, not elsewhere classified (R26.2);Pain    Time: 0918-1000 PT Time Calculation (min) (ACUTE ONLY): 42 min   Charges:   PT Evaluation $PT Eval Moderate Complexity: 1 Mod PT Treatments $Therapeutic Activity: 23-37 mins      Blondell Reveal Kistler PT 10/22/2019  Acute Rehabilitation Services Pager [phone] Office [phone]

## 2019-10-22 NOTE — Consult Note (Signed)
[facility] Nurse Consult Note: Reason for Consult:RIght medial foot wound, chronic nonhealing. Paitent is followed at the outpatient wound care center by Dr. Laurene Footman and was last seen in that setting one week ago on 10/14/19. Wound type: infectious, neuropathic Pressure Injury POA: N/A Measurement:TO be taken by Bedside Rn this morning prior to first dressing change. Wound bed: red, wet Drainage (amount, consistency, odor) strong odor, musty Periwound:intact, dry, edematous, erythematous Dressing procedure/placement/frequency: The dressing used by the outpatient [facility] is a silver collagen.  That combination therapy is not on our inpatient formulary, so I will implement a silver dressing with absorbant properties considering the amount of drainage present.  I have communicated with Dr. Karleen Hampshire via [place] and have recommended either a podiatric medicine consult or Orthopedic surgery consult as patient may require a debridement or other surgical intervention this admission. I have also requested a photo be taken and uploaded into the patient's EMR as there is not a recent one in the record.   The [place] Nurse POC will be superceded by any a physician puts in place. Topical care guidance for daily dressings performed by Nursing is provided only until a physician provider assesses the patient.  [facility] nursing team will not follow, but will remain available to this patient, the nursing and medical teams.  Please re-consult if needed. Thanks, Maudie Flakes, MSN, RN, [place], Arther Abbott  Pager# [phone]

## 2019-10-22 NOTE — Progress Notes (Signed)
Eric Morton is an 54 y.o. male.    Chief Complaint: right foot chronic wound  HPI: 54 y/o male with history of right foot chronic wound to medial 1st MTP joint. Pt denies any pain. Pt has been seen at the wound care center multiple times recently due to this wound. He still have sensation/feeling in the right great toe. CT scan shows slight evidence of osteolysis and possible osteomyelitis. His white blood count is not elevated currently. Pt seen in conjunction with wound care nurse today. He denies any other symptoms in regards to his foot.  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    Hx of adenomatous colonic polyps    Hypercholesterolemia    Hypertension    Peripheral neuropathy    Polycythemia vera ([place]) 2006   Renal insufficiency 07/16/2017   Stroke [facility])    slurred speech   Substance abuse ([place])    Type II diabetes mellitus ([place])     PSes:  Allergies  Allergen Reactions   Lipitor [Atorvastatin] Other (See Comments)    Sister states had to have Cardiac Surgery    Medications: Current Facility-Administered Medications  Medication Dose Route Frequency Provider Last Rate Last Admin   acetaminophen (TYLENOL) tablet 650 mg  650 mg Oral Q6H PRN Vernelle Emerald, MD   650 mg at 10/22/19 3846   Or   acetaminophen (TYLENOL) suppository 650 mg  650 mg Rectal Q6H PRN Shalhoub, Sherryll Burger, MD       aspirin chewable tablet 81 mg  81 mg Oral Daily Shalhoub, Sherryll Burger, MD   81 mg at 10/22/19 1040   atropine 1 % ophthalmic solution 1 drop  1 drop Right Eye BID Vernelle Emerald, MD   1 drop at 10/22/19 1042   ceFEPIme (MAXIPIME) 2 g in sodium chloride 0.9 % 100 mL IVPB  2  g Intravenous Q24H Adrian Saran, RPH 200 mL/hr at 10/22/19 0941 2 g at 10/22/19 0941   Chlorhexidine Gluconate Cloth 2 % PADS 6 each  6 each Topical Daily Hosie Poisson, MD   6 each at 10/22/19 1042   clopidogrel (PLAVIX) tablet 75 mg  75 mg Oral Daily Shalhoub, Sherryll Burger, MD   75 mg at 10/22/19 1047   DAPTOmycin (CUBICIN) 670 mg in sodium chloride 0.9 % IVPB  670 mg Intravenous Q48H Sinclair, Emily S, RPH       enoxaparin (LOVENOX) injection 30 mg  30 mg Subcutaneous Q24H Shalhoub, Sherryll Burger, MD   30 mg at 10/22/19 1046   gabapentin (NEURONTIN) capsule 300 mg  300 mg Oral BID Vernelle Emerald, MD   300 mg at 10/22/19 1043   insulin aspart (novoLOG) injection 0-15 Units  0-15 Units Subcutaneous TID AC & HS Shalhoub, Sherryll Burger, MD   3 Units at 10/22/19 1301   insulin glargine (LANTUS) injection 15 Units  15 Units Subcutaneous Daily Vernelle Emerald, MD   15 Units at 10/22/19 1043   melatonin tablet 10 mg  10 mg Oral QHS Vernelle Emerald, MD   10 mg at 10/21/19 2232   natamycin (NATACYN) 5 % ophthalmic  suspension 1 drop  1 drop Right Eye Q2H while awake Hosie Poisson, MD   1 drop at 10/22/19 1202   ondansetron (ZOFRAN) tablet 4 mg  4 mg Oral Q6H PRN Vernelle Emerald, MD       Or   ondansetron [facility]) injection 4 mg  4 mg Intravenous Q6H PRN Shalhoub, Sherryll Burger, MD       pantoprazole (PROTONIX) EC tablet 40 mg  40 mg Oral QPM Shalhoub, Sherryll Burger, MD   40 mg at 10/21/19 1951   polyethylene glycol (MIRALAX / GLYCOLAX) packet 17 g  17 g Oral Daily PRN Shalhoub, Sherryll Burger, MD       prednisoLONE acetate (PRED FORTE) 1 % ophthalmic suspension 1 drop  1 drop Right Eye QID Shalhoub, Sherryll Burger, MD   1 drop at 10/22/19 1044   rosuvastatin (CRESTOR) tablet 10 mg  10 mg Oral QHS Vernelle Emerald, MD   10 mg at 10/21/19 2233   senna-docusate (Senokot-S) tablet 1 tablet  1 tablet Oral Daily Shalhoub, Sherryll Burger, MD   1 tablet at 10/22/19 1010   sodium bicarbonate 150 mEq in sterile water 1,000  mL infusion   Intravenous Continuous Roney Jaffe, MD 125 mL/hr at 10/22/19 1008 New Bag at 10/22/19 1008   tamsulosin (FLOMAX) capsule 0.4 mg  0.4 mg Oral QPC breakfast Vernelle Emerald, MD   0.4 mg at 10/22/19 0816   tobramycin 15mg /mL (fortified) compounded eye drops  1 drop Right Eye Q2H Hosie Poisson, MD   1 drop at 10/22/19 1202   traZODone (DESYREL) tablet 100 mg  100 mg Oral QHS Vernelle Emerald, MD   100 mg at 10/21/19 2233   trimethoprim-polymyxin b (POLYTRIM) ophthalmic solution 2 drop  2 drop Right Eye Q6H Shalhoub, Sherryll Burger, MD   2 drop at 10/22/19 1202   Vancomycin 25mg /mL (fortified) compounded eye drops  1 drop Right Eye Q2H Hosie Poisson, MD   1 drop at 10/22/19 1203    Results for orders placed or performed during the hospital encounter of 10/20/19 (from the past 48 hour(s))  Lactic acid, plasma     Status: None   Collection Time: 10/21/19 12:39 AM  Result Value Ref Range   Lactic Acid, Venous 1.1 0.5 - 1.9 mmol/L    Comment: Performed at [facility], [place] [address] [place], [place] [address]  Comprehensive metabolic panel     Status: Abnormal   Collection Time: 10/21/19 12:39 AM  Result Value Ref Range   Sodium 132 (L) 135 - 145 mmol/L   Potassium 4.2 3.5 - 5.1 mmol/L   Chloride 102 98 - 111 mmol/L   CO2 17 (L) 22 - 32 mmol/L   Glucose, Bld 196 (H) 70 - 99 mg/dL    Comment: Glucose reference range applies only to samples taken after fasting for at least 8 hours.   BUN 51 (H) 6 - 20 mg/dL   Creatinine, Ser 4.20 (H) 0.61 - 1.24 mg/dL   Calcium 8.2 (L) 8.9 - 10.3 mg/dL   Total Protein 7.6 6.5 - 8.1 g/dL   Albumin 3.1 (L) 3.5 - 5.0 g/dL   AST 38 15 - 41 U/L   ALT 26 0 - 44 U/L   Alkaline Phosphatase 135 (H) 38 - 126 U/L   Total Bilirubin 0.9 0.3 - 1.2 mg/dL   GFR calc non Af Amer 15 (L) >60 mL/min   GFR calc Af Amer 17 (L) >60 mL/min   Anion gap  13 5 - 15    Comment: Performed at [facility], [place] [address],  [place], [place] [address]  CBC WITH DIFFERENTIAL     Status: Abnormal   Collection Time: 10/21/19 12:39 AM  Result Value Ref Range   WBC 8.5 4.0 - 10.5 K/uL   RBC 3.50 (L) 4.22 - 5.81 MIL/uL   Hemoglobin 9.2 (L) 13.0 - 17.0 g/dL   HCT 29.4 (L) 39 - 52 %   MCV 84.0 80.0 - 100.0 fL   MCH 26.3 26.0 - 34.0 pg   MCHC 31.3 30.0 - 36.0 g/dL   RDW 12.9 11.5 - 15.5 %   Platelets 280 150 - 400 K/uL   nRBC 0.0 0.0 - 0.2 %   Neutrophils Relative % 76 %   Neutro Abs 6.4 1.7 - 7.7 K/uL   Lymphocytes Relative 13 %   Lymphs Abs 1.1 0.7 - 4.0 K/uL   Monocytes Relative 9 %   Monocytes Absolute 0.8 0 - 1 K/uL   Eosinophils Relative 2 %   Eosinophils Absolute 0.1 0 - 0 K/uL   Basophils Relative 0 %   Basophils Absolute 0.0 0 - 0 K/uL   Immature Granulocytes 0 %   Abs Immature Granulocytes 0.02 0.00 - 0.07 K/uL    Comment: Performed at [facility], [place] [address], [place], [place] [address]  Blood gas, venous     Status: Abnormal   Collection Time: 10/21/19 12:39 AM  Result Value Ref Range   pH, Ven 7.329 7.25 - 7.43   pCO2, Ven 34.6 (L) 44 - 60 mmHg   pO2, Ven 53.4 (H) 32 - 45 mmHg   Bicarbonate 17.7 (L) 20.0 - 28.0 mmol/L   Acid-base deficit 7.0 (H) 0.0 - 2.0 mmol/L   O2 Saturation 80.5 %   Patient temperature 98.6     Comment: Performed at [facility], [place] [address], [place], [place] [address]  I-Stat Chem 8, ED     Status: Abnormal   Collection Time: 10/21/19 12:46 AM  Result Value Ref Range   Sodium 134 (L) 135 - 145 mmol/L   Potassium 4.2 3.5 - 5.1 mmol/L   Chloride 103 98 - 111 mmol/L   BUN 49 (H) 6 - 20 mg/dL   Creatinine, Ser 4.60 (H) 0.61 - 1.24 mg/dL   Glucose, Bld 191 (H) 70 - 99 mg/dL    Comment: Glucose reference range applies only to samples taken after fasting for at least 8 hours.   Calcium, Ion 1.10 (L) 1.15 - 1.40 mmol/L   TCO2 19 (L) 22 - 32 mmol/L   Hemoglobin 9.2 (L) 13.0 - 17.0 g/dL   HCT 27.0 (L) 39 - 52 %  Urinalysis, Routine  w reflex microscopic Urine, Clean Catch     Status: Abnormal   Collection Time: 10/21/19  1:55 AM  Result Value Ref Range   Color, Urine YELLOW YELLOW   APPearance HAZY (A) CLEAR   Specific Gravity, Urine 1.006 1.005 - 1.030   pH 5.0 5.0 - 8.0   Glucose, UA NEGATIVE NEGATIVE mg/dL   Hgb urine dipstick MODERATE (A) NEGATIVE   Bilirubin Urine NEGATIVE NEGATIVE   Ketones, ur NEGATIVE NEGATIVE mg/dL   Protein, ur 30 (A) NEGATIVE mg/dL   Nitrite NEGATIVE NEGATIVE   Leukocytes,Ua NEGATIVE NEGATIVE   RBC / HPF 0-5 0 - 5 RBC/hpf   WBC, UA 0-5 0 - 5 WBC/hpf   Bacteria, UA RARE (A) NONE SEEN   Mucus  PRESENT    Amorphous Crystal PRESENT     Comment: Performed at [facility], [place] [address], [place], [place] [address]  Urine culture     Status: Abnormal (Preliminary result)   Collection Time: 10/21/19  1:55 AM   Specimen: In/Out Cath Urine  Result Value Ref Range   Specimen Description      IN/OUT CATH URINE Performed at [facility], [place] [address], [place], [place] [address]    Special Requests      NONE Performed at [facility], [place] [address] [place], [place] [address]    Culture (A)     4,000 COLONIES/mL ESCHERICHIA COLI SUSCEPTIBILITIES TO FOLLOW Performed at [facility], [place] [address], [place], [place] [address]    Report Status PENDING   Respiratory Panel by RT PCR (Flu A&B, Covid) - Nasopharyngeal Swab     Status: None   Collection Time: 10/21/19  2:53 AM   Specimen: Nasopharyngeal Swab  Result Value Ref Range   SARS Coronavirus 2 by RT PCR NEGATIVE NEGATIVE    Comment: (NOTE) SARS-CoV-2 target nucleic acids are NOT DETECTED.  The SARS-CoV-2 RNA is generally detectable in upper respiratoy specimens during the acute phase of infection. The lowest concentration of SARS-CoV-2 viral copies this assay can detect is 131 copies/mL. A negative result does not preclude SARS-Cov-2 infection and should not be used  as the sole basis for treatment or other patient management decisions. A negative result may occur with  improper specimen collection/handling, submission of specimen other than nasopharyngeal swab, presence of viral mutation(s) within the areas targeted by this assay, and inadequate number of viral copies (<131 copies/mL). A negative result must be combined with clinical observations, patient history, and epidemiological information. The expected result is Negative.  Fact Sheet for Patients:  PinkCheek.be  Fact Sheet for Healthcare Providers:  GravelBags.it  This test is no t yet approved or cleared by the [place] FDA and  has been authorized for detection and/or diagnosis of SARS-CoV-2 by FDA under an Emergency Use Authorization (EUA). This EUA will remain  in effect (meaning this test can be used) for the duration of the COVID-19 declaration under Section 564(b)(1) of the Act, 21 U.S.C. section 360bbb-3(b)(1), unless the authorization is terminated or revoked sooner.     Influenza A by PCR NEGATIVE NEGATIVE   Influenza B by PCR NEGATIVE NEGATIVE    Comment: (NOTE) The Xpert Xpress SARS-CoV-2/FLU/RSV assay is intended as an aid in  the diagnosis of influenza from Nasopharyngeal swab specimens and  should not be used as a sole basis for treatment. Nasal washings and  aspirates are unacceptable for Xpert Xpress SARS-CoV-2/FLU/RSV  testing.  Fact Sheet for Patients: PinkCheek.be  Fact Sheet for Healthcare Providers: GravelBags.it  This test is not yet approved or cleared by the [place] FDA and  has been authorized for detection and/or diagnosis of SARS-CoV-2 by  FDA under an Emergency Use Authorization (EUA). This EUA will remain  in effect (meaning this test can be used) for the duration of the  Covid-19 declaration under Section 564(b)(1) of the  Act, 21  U.S.C. section 360bbb-3(b)(1), unless the authorization is  terminated or revoked. Performed at [facility], [place] [address] [place], [place] [address]   Lactic acid, plasma     Status: None   Collection Time: 10/21/19  7:24 AM  Result Value Ref Range   Lactic Acid, Venous 0.7 0.5 - 1.9 mmol/L  Comment: Performed at [facility], [place] [address] [place], [place] [address]  Hemoglobin A1c     Status: Abnormal   Collection Time: 10/21/19  7:24 AM  Result Value Ref Range   Hgb A1c MFr Bld 7.7 (H) 4.8 - 5.6 %    Comment: (NOTE) Pre diabetes:          5.7%-6.4%  Diabetes:              >6.4%  Glycemic control for   <7.0% adults with diabetes    Mean Plasma Glucose 174.29 mg/dL    Comment: Performed at [place] [address], [place], [place] [address]  Culture, blood (routine x 2)     Status: None (Preliminary result)   Collection Time: 10/21/19  7:24 AM   Specimen: BLOOD LEFT HAND  Result Value Ref Range   Specimen Description      BLOOD LEFT HAND Performed at [place] [address] [place], [place] [address]    Special Requests      BOTTLES DRAWN AEROBIC ONLY Blood Culture adequate volume Performed at [place] [address], [place], [place] [address]    Culture      NO GROWTH 1 DAY Performed at [place] [address], [place], [place] [address]    Report Status PENDING   Culture, blood (routine x 2)     Status: None (Preliminary result)   Collection Time: 10/21/19  7:24 AM   Specimen: BLOOD  Result Value Ref Range   Specimen Description      BLOOD LEFT THUMB Performed at [place] [address] [place], [place] [address]    Special Requests      BOTTLES DRAWN AEROBIC ONLY Blood Culture adequate volume Performed at [place] [address] [place], [place] [address]    Culture      NO GROWTH 1  DAY Performed at [place] [address], [place],  [address]    Report Status PENDING   C-reactive protein     Status: Abnormal   Collection Time: 10/21/19  7:24 AM  Result Value Ref Range   CRP 17.9 (H) <1.0 mg/dL    Comment: Performed at [facility], [place] [address], [place],  [address]  Comprehensive metabolic panel     Status: Abnormal   Collection Time: 10/21/19  7:24 AM  Result Value Ref Range   Sodium 137 135 - 145 mmol/L   Potassium 3.9 3.5 - 5.1 mmol/L   Chloride 107 98 - 111 mmol/L   CO2 17 (L) 22 - 32 mmol/L   Glucose, Bld 164 (H) 70 - 99 mg/dL    Comment: Glucose reference range applies only to samples taken after fasting for at least 8 hours.   BUN 44 (H) 6 - 20 mg/dL   Creatinine, Ser 3.87 (H) 0.61 - 1.24 mg/dL   Calcium 7.6 (L) 8.9 - 10.3 mg/dL   Total Protein 6.3 (L) 6.5 - 8.1 g/dL   Albumin 2.6 (L) 3.5 - 5.0 g/dL   AST 24 15 - 41 U/L   ALT 21 0 - 44 U/L   Alkaline Phosphatase 118 38 - 126 U/L   Total Bilirubin 0.7 0.3 - 1.2 mg/dL   GFR calc non Af Amer 16 (L) >60 mL/min   GFR calc Af Amer 19 (L) >60 mL/min   Anion gap 13 5 - 15  Comment: Performed at [facility], [place] [address], [place], [place] [address]  HIV Antibody (routine testing w rflx)     Status: None   Collection Time: 10/21/19  7:54 AM  Result Value Ref Range   HIV Screen 4th Generation wRfx Non Reactive Non Reactive    Comment: Performed at [facility], [place] [address] [place], [place] [address]  Glucose, capillary     Status: Abnormal   Collection Time: 10/21/19  8:11 AM  Result Value Ref Range   Glucose-Capillary 137 (H) 70 - 99 mg/dL    Comment: Glucose reference range applies only to samples taken after fasting for at least 8 hours.  C Difficile Quick Screen w PCR reflex     Status: None   Collection Time: 10/21/19 10:42 AM   Specimen: STOOL  Result Value Ref Range   C Diff antigen NEGATIVE NEGATIVE   C Diff toxin  NEGATIVE NEGATIVE   C Diff interpretation No C. difficile detected.     Comment: Performed at [facility], [place] [address], [place], [place] [address]  Glucose, capillary     Status: Abnormal   Collection Time: 10/21/19 11:53 AM  Result Value Ref Range   Glucose-Capillary 147 (H) 70 - 99 mg/dL    Comment: Glucose reference range applies only to samples taken after fasting for at least 8 hours.  Procalcitonin - Baseline     Status: None   Collection Time: 10/21/19  1:05 PM  Result Value Ref Range   Procalcitonin 0.76 ng/mL    Comment:        Interpretation: PCT > 0.5 ng/mL and <= 2 ng/mL: Systemic infection (sepsis) is possible, but other conditions are known to elevate PCT as well. (NOTE)       Sepsis PCT Algorithm           Lower Respiratory Tract                                      Infection PCT Algorithm    ----------------------------     ----------------------------         PCT < 0.25 ng/mL                PCT < 0.10 ng/mL          Strongly encourage             Strongly discourage   discontinuation of antibiotics    initiation of antibiotics    ----------------------------     -----------------------------       PCT 0.25 - 0.50 ng/mL            PCT 0.10 - 0.25 ng/mL               OR       >80% decrease in PCT            Discourage initiation of                                            antibiotics      Encourage discontinuation           of antibiotics    ----------------------------     -----------------------------         PCT >= 0.50 ng/mL  PCT 0.26 - 0.50 ng/mL                AND       <80% decrease in PCT             Encourage initiation of                                             antibiotics       Encourage continuation           of antibiotics    ----------------------------     -----------------------------        PCT >= 0.50 ng/mL                  PCT > 0.50 ng/mL               AND         increase in PCT                   Strongly encourage                                      initiation of antibiotics    Strongly encourage escalation           of antibiotics                                     -----------------------------                                           PCT <= 0.25 ng/mL                                                 OR                                        > 80% decrease in PCT                                      Discontinue / Do not initiate                                             antibiotics  Performed at [place] [address] [place], [place] [address]   CK     Status: None   Collection Time: 10/21/19  1:05 PM  Result Value Ref Range   Total CK 120 49.0 - 397.0 U/L    Comment: Performed at [facility], [place] [address] [place], [place] [address]  Glucose, capillary     Status: Abnormal   Collection Time: 10/21/19  5:03 PM  Result  Value Ref Range   Glucose-Capillary 229 (H) 70 - 99 mg/dL    Comment: Glucose reference range applies only to samples taken after fasting for at least 8 hours.  Glucose, capillary     Status: Abnormal   Collection Time: 10/21/19  9:26 PM  Result Value Ref Range   Glucose-Capillary 178 (H) 70 - 99 mg/dL    Comment: Glucose reference range applies only to samples taken after fasting for at least 8 hours.  Magnesium     Status: Abnormal   Collection Time: 10/22/19  5:21 AM  Result Value Ref Range   Magnesium 1.6 (L) 1.7 - 2.4 mg/dL    Comment: Performed at [facility], [place] [address] [place], [place] [address]  CBC WITH DIFFERENTIAL     Status: Abnormal   Collection Time: 10/22/19  5:21 AM  Result Value Ref Range   WBC 6.4 4.0 - 10.5 K/uL   RBC 3.18 (L) 4.22 - 5.81 MIL/uL   Hemoglobin 8.6 (L) 13.0 - 17.0 g/dL   HCT 26.8 (L) 39 - 52 %   MCV 84.3 80.0 - 100.0 fL   MCH 27.0 26.0 - 34.0 pg   MCHC 32.1 30.0 - 36.0 g/dL   RDW 12.9 11.5 - 15.5 %   Platelets 249 150 - 400 K/uL     Comment: PLATELET COUNT CONFIRMED BY SMEAR SPECIMEN CHECKED FOR CLOTS CONSISTENT WITH PREVIOUS RESULT    nRBC 0.0 0.0 - 0.2 %   Neutrophils Relative % 61 %   Neutro Abs 4.0 1.7 - 7.7 K/uL   Lymphocytes Relative 23 %   Lymphs Abs 1.4 0.7 - 4.0 K/uL   Monocytes Relative 10 %   Monocytes Absolute 0.7 0 - 1 K/uL   Eosinophils Relative 5 %   Eosinophils Absolute 0.3 0 - 0 K/uL   Basophils Relative 1 %   Basophils Absolute 0.0 0 - 0 K/uL   Immature Granulocytes 0 %   Abs Immature Granulocytes 0.02 0.00 - 0.07 K/uL    Comment: Performed at [facility], [place] [address], [place], [place] [address]  Comprehensive metabolic panel     Status: Abnormal   Collection Time: 10/22/19  5:21 AM  Result Value Ref Range   Sodium 139 135 - 145 mmol/L   Potassium 3.5 3.5 - 5.1 mmol/L   Chloride 104 98 - 111 mmol/L   CO2 23 22 - 32 mmol/L   Glucose, Bld 153 (H) 70 - 99 mg/dL    Comment: Glucose reference range applies only to samples taken after fasting for at least 8 hours.   BUN 34 (H) 6 - 20 mg/dL   Creatinine, Ser 3.26 (H) 0.61 - 1.24 mg/dL   Calcium 8.3 (L) 8.9 - 10.3 mg/dL   Total Protein 6.2 (L) 6.5 - 8.1 g/dL   Albumin 2.5 (L) 3.5 - 5.0 g/dL   AST 18 15 - 41 U/L   ALT 19 0 - 44 U/L   Alkaline Phosphatase 104 38 - 126 U/L   Total Bilirubin 0.5 0.3 - 1.2 mg/dL   GFR calc non Af Amer 20 (L) >60 mL/min   GFR calc Af Amer 23 (L) >60 mL/min   Anion gap 12 5 - 15    Comment: Performed at [facility], [place] [address] [place], [place] [address]  Procalcitonin     Status: None   Collection Time: 10/22/19  5:21 AM  Result Value Ref Range   Procalcitonin 0.67 ng/mL  Comment:        Interpretation: PCT > 0.5 ng/mL and <= 2 ng/mL: Systemic infection (sepsis) is possible, but other conditions are known to elevate PCT as well. (NOTE)       Sepsis PCT Algorithm           Lower Respiratory Tract                                      Infection PCT Algorithm     ----------------------------     ----------------------------         PCT < 0.25 ng/mL                PCT < 0.10 ng/mL          Strongly encourage             Strongly discourage   discontinuation of antibiotics    initiation of antibiotics    ----------------------------     -----------------------------       PCT 0.25 - 0.50 ng/mL            PCT 0.10 - 0.25 ng/mL               OR       >80% decrease in PCT            Discourage initiation of                                            antibiotics      Encourage discontinuation           of antibiotics    ----------------------------     -----------------------------         PCT >= 0.50 ng/mL              PCT 0.26 - 0.50 ng/mL                AND       <80% decrease in PCT             Encourage initiation of                                             antibiotics       Encourage continuation           of antibiotics    ----------------------------     -----------------------------        PCT >= 0.50 ng/mL                  PCT > 0.50 ng/mL               AND         increase in PCT                  Strongly encourage                                      initiation of antibiotics    Strongly encourage escalation           of antibiotics                                     -----------------------------  PCT <= 0.25 ng/mL                                                 OR                                        > 80% decrease in PCT                                      Discontinue / Do not initiate                                             antibiotics  Performed at [place] [address], [place], [place] [address]   CK     Status: None   Collection Time: 10/22/19  5:21 AM  Result Value Ref Range   Total CK 128 49.0 - 397.0 U/L    Comment: Performed at [facility], [place] [address], [place], [place] [address]  Glucose, capillary     Status:  Abnormal   Collection Time: 10/22/19  7:49 AM  Result Value Ref Range   Glucose-Capillary 130 (H) 70 - 99 mg/dL    Comment: Glucose reference range applies only to samples taken after fasting for at least 8 hours.  Glucose, capillary     Status: Abnormal   Collection Time: 10/22/19 11:48 AM  Result Value Ref Range   Glucose-Capillary 188 (H) 70 - 99 mg/dL    Comment: Glucose reference range applies only to samples taken after fasting for at least 8 hours.   CT FOOT RIGHT WO CONTRAST  Result Date: 10/21/2019 CLINICAL DATA:  Diabetic with bilateral foot wounds, worsening on the right. Clinical concern for osteomyelitis. EXAM: CT OF THE RIGHT FOOT WITHOUT CONTRAST TECHNIQUE: Multidetector CT imaging of the right foot was performed according to the standard protocol. Multiplanar CT image reconstructions were also generated. COMPARISON:  Radiographs 10/21/2019.  No other comparison studies. FINDINGS: Bones/Joint/Cartilage There is soft tissue ulceration medial to the 1st metatarsal head, as seen on earlier radiographs. There is cortical irregularity of the adjacent 1st metatarsal head medially with bony fragmentation and possible osteolysis, suspicious for osteomyelitis. There is underlying joint space narrowing, small erosions and osteophytes within the 1st metatarsal head and its sesamoids. No other findings suspicious for osteomyelitis. There is no evidence of acute fracture or dislocation. The bones are mildly demineralized. There are mild talonavicular degenerative changes. Probable effusion of the 1st metatarsophalangeal joint. No other significant effusions identified. Ligaments Suboptimally assessed by CT. Muscles and Tendons Grossly intact ankle tendons without significant tenosynovitis. Mild generalized muscular atrophy without focal fluid collection. Soft tissues As above, soft tissue ulceration medial to 1st metatarsal head with underlying soft tissue swelling and an effusion of the 1st MTP  joint. No focal fluid collection, soft tissue emphysema or foreign body seen. Prominent vascular calcifications are noted, consistent with diabetes. IMPRESSION: 1. Soft tissue ulceration medial to the 1st metatarsal head with underlying cortical irregularity and possible osteolysis, suspicious for osteomyelitis. 2. Underlying degenerative changes at  the 1st MTP joint with nonspecific joint effusion. 3. No evidence of soft tissue emphysema or foreign body. Electronically Signed   By: Richardean Sale M.D.   On: 10/21/2019 15:55   DG Foot 2 Views Right  Result Date: 10/21/2019 CLINICAL DATA:  Right foot wound EXAM: RIGHT FOOT - 2 VIEW COMPARISON:  None. FINDINGS: Two view radiograph right foot demonstrates a soft tissue defect medial to the first metatarsal head with mild associated soft tissue swelling but no retained radiopaque foreign body or subjacent osseous erosion. Mild first ray bunion deformity. Mild degenerative arthritis at the first metatarsophalangeal joint. No acute fracture or dislocation. Remaining joint spaces are preserved. Vascular calcifications are seen within the soft tissues. Mild right forefoot soft tissue swelling. No ankle effusion. IMPRESSION: Soft tissue ulceration and associated soft tissue swelling. No associated osseous erosion or retained radiopaque foreign body. Electronically Signed   By: Fidela Salisbury MD   On: 10/21/2019 00:44   CT Renal Stone Study  Result Date: 10/21/2019 CLINICAL DATA:  Acute renal failure, hypertension EXAM: CT ABDOMEN AND PELVIS WITHOUT CONTRAST TECHNIQUE: Multidetector CT imaging of the abdomen and pelvis was performed following the standard protocol without IV contrast. COMPARISON:  None. FINDINGS: Lower chest: The visualized lung bases are clear. Cardiac size within normal limits. No pericardial effusion. Hepatobiliary: No focal liver abnormality is seen. No gallstones, gallbladder wall thickening, or biliary dilatation. Pancreas: Unremarkable Spleen:  Unremarkable Adrenals/Urinary Tract: There is mild an in bilateral hydronephrosis and mild distention of the bladder which, together, may reflect changes of voluntary urinary retention or bladder outlet obstruction. The kidneys are normal in size and position. No intrarenal or ureteral calculi are identified. Mild bilateral nonspecific perinephric stranding is seen. The adrenal glands are unremarkable. Stomach/Bowel: The stomach, small bowel, and large bowel are unremarkable. Appendix normal. No free intraperitoneal gas or fluid. Vascular/Lymphatic: Mild aortoiliac atherosclerotic calcification without evidence of aneurysm. No pathologic adenopathy within the abdomen and pelvis. Reproductive: The prostate gland is normal in size. Seminal vesicles are unremarkable. Extensive calcification of the vas deferens is seen, a finding that can be seen the setting of longstanding diabetes mellitus. Other: Rectum unremarkable. Musculoskeletal: No acute bone abnormality. IMPRESSION: Mild bilateral hydronephrosis and moderate distention of the bladder suggesting changes of either voluntary retention or bladder outlet obstruction. Aortic Atherosclerosis (ICD10-I70.0). 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 Stann Mainland and currently would recommend continued local wound care with dressings and wound care management Discussed possibility of ray amputation if his symptoms worsen and patient is not interested  Continue antibiotics as needed per ID  recommendation Patient may follow up with Dr. [name] as an outpatient also once discharged Will continue to monitor his progress  Merla Riches PA-C, [facility] now [facility] [address], [address], [place], [place] [address] Phone: [phone] [email or web address]   [employer]

## 2019-10-22 NOTE — NC FL2 (Signed)
[place] LEVEL OF CARE SCREENING TOOL     IDENTIFICATION  Patient Name: Eric Morton Birthdate: Oct 05, 1965 Sex: male Admission Date (Current Location): 10/20/2019  [facility] and [place] Number:  Herbalist and Address:  [facility],  [place] [address] [place]      Provider Number: [id number]  Attending Physician Name and Address:  Hosie Poisson, MD  Relative Name and Phone Number:       Current Level of Care: Hospital Recommended Level of Care: [place] Prior Approval Number:    Date Approved/Denied:   PASRR Number: [id number]  Discharge Plan: SNF    Current Diagnoses: Patient Active Problem List   Diagnosis Date Noted  . Osteomyelitis ([place]) 10/22/2019  . Acute renal failure superimposed on stage 3b chronic kidney disease ([place]) 10/21/2019  . Acute urinary retention 10/21/2019  . GERD without esophagitis 10/21/2019  . Bacterial conjunctivitis of right eye 10/21/2019  . Blisters of multiple sites 11/30/2017  . Renal insufficiency 07/16/2017  . Hemiparesis affecting right side as late effect of stroke ([place]) 10/29/2016  . Generalized weakness 10/23/2016  . Hyperlipidemia associated with type 2 diabetes mellitus ([place]) 07/27/2015  . History of CVA (cerebrovascular accident)   . Polycythemia vera ([place]) 03/21/2015  . Hx of adenomatous colonic polyps   . Essential hypertension 09/25/2011  . Diabetic infection of right foot ([place]) 09/25/2011  . Cocaine abuse ([place]) 09/25/2011  . Tobacco use 09/25/2011    Orientation RESPIRATION BLADDER Height & Weight     Self, Place  Normal Continent Weight: 83.9 kg Height:  5\' 8"  (172.7 cm)  BEHAVIORAL SYMPTOMS/MOOD NEUROLOGICAL BOWEL NUTRITION STATUS      Incontinent Diet  AMBULATORY STATUS COMMUNICATION OF NEEDS Skin   Extensive Assist Verbally PU Stage and Appropriate Care (Wound care to medial foot wounds (Chronic, nonhealing): Cleanse with NS, rinse with NS and dry,  Cover with silver hydrofiber (Aquacel Advantage, [place] # [id number]), top with dry gauze, secure with Kerlix roll gauze and paper tape.  Change daily.)                       Personal Care Assistance Level of Assistance  Bathing, Dressing, Total care Bathing Assistance: Maximum assistance   Dressing Assistance: Maximum assistance Total Care Assistance: Maximum assistance   Functional Limitations Info             SPECIAL CARE FACTORS FREQUENCY  PT (By licensed PT), OT (By licensed OT)     PT Frequency: 5x weekly OT Frequency: 5x weekly            Contractures Contractures Info: Not present    Additional Factors Info  Code Status, Allergies, Insulin Sliding Scale Code Status Info: full Allergies Info: lipitor   Insulin Sliding Scale Info: Novolog 0-15 units, Lantus 15 units       Current Medications (10/22/2019):  This is the current hospital active medication list Current Facility-Administered Medications  Medication Dose Route Frequency Provider Last Rate Last Admin  . acetaminophen (TYLENOL) tablet 650 mg  650 mg Oral Q6H PRN Vernelle Emerald, MD   650 mg at 10/22/19 0526   Or  . acetaminophen (TYLENOL) suppository 650 mg  650 mg Rectal Q6H PRN Vernelle Emerald, MD      . aspirin chewable tablet 81 mg  81 mg Oral Daily Shalhoub, Sherryll Burger, MD   81 mg at 10/22/19 1040  . atropine 1 % ophthalmic solution 1 drop  1 drop Right Eye BID Vernelle Emerald, MD   1 drop at 10/22/19 1042  . ceFEPIme (MAXIPIME) 2 g in sodium chloride 0.9 % 100 mL IVPB  2 g Intravenous Q24H Adrian Saran, RPH 200 mL/hr at 10/22/19 0941 2 g at 10/22/19 0941  . Chlorhexidine Gluconate Cloth 2 % PADS 6 each  6 each Topical Daily Hosie Poisson, MD   6 each at 10/22/19 1042  . clopidogrel (PLAVIX) tablet 75 mg  75 mg Oral Daily Shalhoub, Sherryll Burger, MD   75 mg at 10/22/19 1047  . DAPTOmycin (CUBICIN) 670 mg in sodium chloride 0.9 % IVPB  670 mg Intravenous Q48H Jimmy Footman S, RPH      .  enoxaparin (LOVENOX) injection 30 mg  30 mg Subcutaneous Q24H Shalhoub, Sherryll Burger, MD   30 mg at 10/22/19 1046  . gabapentin (NEURONTIN) capsule 300 mg  300 mg Oral BID Vernelle Emerald, MD   300 mg at 10/22/19 1043  . insulin aspart (novoLOG) injection 0-15 Units  0-15 Units Subcutaneous TID AC & HS Shalhoub, Sherryll Burger, MD   3 Units at 10/22/19 1301  . insulin glargine (LANTUS) injection 15 Units  15 Units Subcutaneous Daily Shalhoub, Sherryll Burger, MD   15 Units at 10/22/19 1043  . melatonin tablet 10 mg  10 mg Oral QHS Shalhoub, Sherryll Burger, MD   10 mg at 10/21/19 2232  . natamycin (NATACYN) 5 % ophthalmic suspension 1 drop  1 drop Right Eye Q2H while awake Hosie Poisson, MD   1 drop at 10/22/19 1430  . ondansetron (ZOFRAN) tablet 4 mg  4 mg Oral Q6H PRN Shalhoub, Sherryll Burger, MD       Or  . ondansetron [facility]) injection 4 mg  4 mg Intravenous Q6H PRN Shalhoub, Sherryll Burger, MD      . pantoprazole (PROTONIX) EC tablet 40 mg  40 mg Oral QPM Shalhoub, Sherryll Burger, MD   40 mg at 10/21/19 1951  . polyethylene glycol (MIRALAX / GLYCOLAX) packet 17 g  17 g Oral Daily PRN Shalhoub, Sherryll Burger, MD      . prednisoLONE acetate (PRED FORTE) 1 % ophthalmic suspension 1 drop  1 drop Right Eye QID Shalhoub, Sherryll Burger, MD   1 drop at 10/22/19 1430  . rosuvastatin (CRESTOR) tablet 10 mg  10 mg Oral QHS Shalhoub, Sherryll Burger, MD   10 mg at 10/21/19 2233  . senna-docusate (Senokot-S) tablet 1 tablet  1 tablet Oral Daily Shalhoub, Sherryll Burger, MD   1 tablet at 10/22/19 1010  . sodium bicarbonate 150 mEq in sterile water 1,000 mL infusion   Intravenous Continuous Roney Jaffe, MD 125 mL/hr at 10/22/19 1008 New Bag at 10/22/19 1008  . tamsulosin (FLOMAX) capsule 0.4 mg  0.4 mg Oral QPC breakfast Shalhoub, Sherryll Burger, MD   0.4 mg at 10/22/19 0816  . tobramycin 15mg /mL (fortified) compounded eye drops  1 drop Right Eye Q2H Hosie Poisson, MD   1 drop at 10/22/19 1430  . traZODone (DESYREL) tablet 100 mg  100 mg Oral QHS Vernelle Emerald, MD    100 mg at 10/21/19 2233  . trimethoprim-polymyxin b (POLYTRIM) ophthalmic solution 2 drop  2 drop Right Eye Q6H Shalhoub, Sherryll Burger, MD   2 drop at 10/22/19 1202  . Vancomycin 25mg /mL (fortified) compounded eye drops  1 drop Right Eye Q2H Hosie Poisson, MD   1 drop at 10/22/19 1431     Discharge Medications: Please see discharge summary for  a list of discharge medications.  Relevant Imaging Results:  Relevant Lab Results:   Additional Information SS#: [id number]  Joaquin Courts, RN

## 2019-10-22 NOTE — Progress Notes (Signed)
[facility] Progress Note  Subjective: seen in room, no new c/o, creat down 3.2 today, K okay, UOP 3.9L yest and 2.5 L out today. BP's are soft.   Vitals:   10/22/19 0955 10/22/19 1000 10/22/19 1100 10/22/19 1252  BP: (!) 76/60 (!) 91/55 122/72   Pulse:   98   Resp:   15   Temp:    97.8 F (36.6 C)  TempSrc:      SpO2:   100%   Weight:      Height:        Exam: Gen alert, no distress, pleasant No jvd or bruits Chest clear bilat to bases no rales RRR no MRG  Abd soft ntnd no mass or ascites +bs  GU normal male w / foley draining clear light yellow urine Ext no leg or uE edema Neuro is alert, Ox 3 , nf, mod gen weakness    Home meds:  - norvasc 5/ asa 81/ plavix 75/ crestor 40/ lisinopril - hctz qd  - insulin lantus 15u/ aspart 2- 8 ssi tid/ glipizode 10 bid  - trazodone 100 hs/ norco qid prn/ neurontin 300 bid  - protonix 40  - prn's/ vitamins/ supplements      Date                            Creat                           eGFR  2009- 2018                 0.90- 1.04 July 2017                  4.45 >> 1.58                AKI episode, 16 >> 56 ml/min  Dec 22, 2017              2.21                             45 ml/min  Sept 24, 2021             4.2, 4.6, 3,8    UA 9/24 - negative, prot 30   CT renal stone noncon 9/24 - Adrenals/Urinary Tract: There is mild bilateral hydronephrosis and mild distention of the bladder which, together, may reflect changes of voluntary urinary retention or bladder outlet obstruction. The kidneys are normal in size and position. No intrarenal or ureteral calculi are identified. Mild bilateral nonspecific perinephric stranding is seen. The adrenal glands are unremarkable.    BP 120 /70 HR 97 RR 13  Temp 98  RA 98%     Assessment/ Plan: 1. AoCKD 3a - b/l creat 1.6- 2.2, eGFR 45- 56 ml/min.  Creat 4.2 on admit > down to 3.2 today. Suspect AKI due to vol depletion/ CKD/ acei at home. Foley placed for mild hydro  on CT. Not sure the mild hydro is meaningful, could be "voluntary" retention. Very good UOP, expect continued improvement. Would cont IVF's until eating well.  Avoid nsaids/ contrast/ IV vanc. If any questions please call, will sign off.  2. Diabetic foot ulcer - getting dapto/ cefepime 3. H/o CVA - snf dependent 4. Hx HTN - hold BP meds  for now      [employer] 10/22/2019, 5:21 PM   Recent Labs  Lab 10/21/19 0046 10/21/19 0046 10/21/19 0724 10/22/19 0521  K 4.2   < > 3.9 3.5  BUN 49*   < > 44* 34*  CREATININE 4.60*   < > 3.87* 3.26*  CALCIUM  --    < > 7.6* 8.3*  HGB 9.2*  --   --  8.6*   < > = values in this interval not displayed.   Inpatient medications: . aspirin  81 mg Oral Daily  . atropine  1 drop Right Eye BID  . Chlorhexidine Gluconate Cloth  6 each Topical Daily  . clopidogrel  75 mg Oral Daily  . enoxaparin (LOVENOX) injection  30 mg Subcutaneous Q24H  . gabapentin  300 mg Oral BID  . insulin aspart  0-15 Units Subcutaneous TID AC & HS  . insulin glargine  15 Units Subcutaneous Daily  . melatonin  10 mg Oral QHS  . natamycin  1 drop Right Eye Q2H while awake  . pantoprazole  40 mg Oral QPM  . prednisoLONE acetate  1 drop Right Eye QID  . rosuvastatin  10 mg Oral QHS  . senna-docusate  1 tablet Oral Daily  . tamsulosin  0.4 mg Oral QPC breakfast  . tobramycin 78m/mL (fortified) compounded eye drops  1 drop Right Eye Q2H  . traZODone  100 mg Oral QHS  . trimethoprim-polymyxin b  2 drop Right Eye Q6H  . Vancomycin 248mmL (fortified) compounded eye drops  1 drop Right Eye Q2H   . ceFEPime (MAXIPIME) IV 2 g (10/22/19 0941)  . DAPTOmycin (CUBICIN)  IV    . magnesium sulfate bolus IVPB 1 g (10/22/19 1655)  .  sodium bicarbonate (isotonic) infusion in sterile water 125 mL/hr at 10/22/19 1008   acetaminophen **OR** acetaminophen, ondansetron **OR** ondansetron (ZOFRAN) IV, polyethylene glycol

## 2019-10-23 LAB — PROCALCITONIN: Procalcitonin: 0.43 ng/mL

## 2019-10-23 LAB — BASIC METABOLIC PANEL
Anion gap: 11 (ref 5–15)
BUN: 27 mg/dL — ABNORMAL HIGH (ref 6–20)
CO2: 31 mmol/L (ref 22–32)
Calcium: 8 mg/dL — ABNORMAL LOW (ref 8.9–10.3)
Chloride: 99 mmol/L (ref 98–111)
Creatinine, Ser: 2.87 mg/dL — ABNORMAL HIGH (ref 0.61–1.24)
GFR calc Af Amer: 27 mL/min — ABNORMAL LOW (ref >60)
GFR calc non Af Amer: 23 mL/min — ABNORMAL LOW (ref >60)
Glucose, Bld: 116 mg/dL — ABNORMAL HIGH (ref 70–99)
Potassium: 2.9 mmol/L — ABNORMAL LOW (ref 3.5–5.1)
Sodium: 141 mmol/L (ref 135–145)

## 2019-10-23 LAB — GLUCOSE, CAPILLARY
Glucose-Capillary: 103 mg/dL — ABNORMAL HIGH (ref 70–99)
Glucose-Capillary: 181 mg/dL — ABNORMAL HIGH (ref 70–99)
Glucose-Capillary: 160 mg/dL — ABNORMAL HIGH (ref 70–99)
Glucose-Capillary: 127 mg/dL — ABNORMAL HIGH (ref 70–99)

## 2019-10-23 LAB — CBC
HCT: 27.2 % — ABNORMAL LOW (ref 39.0–52.0)
Hemoglobin: 8.6 g/dL — ABNORMAL LOW (ref 13.0–17.0)
MCH: 27 pg (ref 26.0–34.0)
MCHC: 31.6 g/dL (ref 30.0–36.0)
MCV: 85.5 fL (ref 80.0–100.0)
Platelets: 279 10*3/uL (ref 150–400)
RBC: 3.18 MIL/uL — ABNORMAL LOW (ref 4.22–5.81)
RDW: 12.9 % (ref 11.5–15.5)
WBC: 6.6 10*3/uL (ref 4.0–10.5)
nRBC: 0 % (ref 0.0–0.2)

## 2019-10-23 LAB — URINE CULTURE: Culture: 4000 — AB

## 2019-10-23 LAB — MAGNESIUM: Magnesium: 1.6 mg/dL — ABNORMAL LOW (ref 1.7–2.4)

## 2019-10-23 LAB — PHOSPHORUS: Phosphorus: 3.6 mg/dL (ref 2.5–4.6)

## 2019-10-23 MED ORDER — POTASSIUM CHLORIDE CRYS ER 20 MEQ PO TBCR
40.0000 meq | EXTENDED_RELEASE_TABLET | Freq: Two times a day (BID) | ORAL | Status: AC
  Administered 2019-10-23 (×2): 40 meq via ORAL
  Filled 2019-10-23 (×2): qty 2

## 2019-10-23 MED ORDER — ENOXAPARIN SODIUM 40 MG/0.4ML ~~LOC~~ SOLN
40.0000 mg | SUBCUTANEOUS | Status: DC
  Administered 2019-10-24 – 2019-10-26 (×3): 40 mg via SUBCUTANEOUS
  Filled 2019-10-23 (×3): qty 0.4

## 2019-10-23 NOTE — Progress Notes (Signed)
PROGRESS NOTE    Eric Morton  YSA:[id number] DOB: Nov 08, 1965 DOA: 10/20/2019 PCP: [name], NP    No chief complaint on file.   Brief Narrative:  54 year old gentleman prior history of stage IIIb CKD, hypertension, hyperlipidemia, type 2 diabetes mellitus, insulin-dependent diabetes, multiple previous strokes with significant debility presents to ED for progressive weakness. 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 Pt is seen and examined at bedside. Denies any new complaints. No pain in the right foot.   Assessment & Plan:   Principal Problem:   Acute renal failure superimposed on stage 3a chronic kidney disease (HCC) Active Problems:   Essential hypertension   Diabetic infection of right foot ([place])   History of CVA (cerebrovascular accident)   Hyperlipidemia associated with type 2 diabetes mellitus ([name])   Generalized weakness   Acute urinary retention   GERD without esophagitis   Bacterial conjunctivitis of right eye   Osteomyelitis (HCC)   Endophthalmitis of the right eye Improving ophthalmology on board and recommended fortified vancomycin, tobramycin and natamycin eyedrops. Recommend outpatient follow-up with ophthalmology on discharge.    Right sided diabetic foot ulcer with possible osteomyelitis of the first metatarsal head: -Wound care consulted for dressing changes. -Patient on broad-spectrum IV antibiotics and ID on board for duration. -Orthopedics consulted to see if he is a candidate for debridement/first ray amputation. -Pain control and physical therapy. - orthopedics consulted, recommended local wound care with dressing changes, and plan for first ray amputation if the wound worsens, and outpatient follow up with Dr [name].     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 Patient on Plavix at home was  initially held for possible procedure but has been resumed .    Acute on stage IIIa CKD Baseline creatinine between 1.6-2.2, admitted with a creatinine of 4.2 with improvement to 3.8 to 2.87 with fluids.  Foley catheter placed for mild bilateral hydronephrosis evident on CT. Nephrology consulted and appreciate recommendations,..   Essential hypertension bp parameters are well controlled.    Hyperlipidemia Continue with statin   GERD Stable, on PPI.    Diabetes mellitus, insulin-dependent CBG (last 3)  Recent Labs    10/22/19 2217 10/23/19 0738 10/23/19 1208  GLUCAP 161* 103* 181*   Resume sliding scale insulin and Lantus 15 units daily.   Acute urinary retention Foley catheter placed probably secondary to BPH. Continue with Flomax.   Anemia of acute illness superimposed on Anemia of chronic disease Baseline hemoglobin is 10 , dropped to 8.6.  Transfuse to keep hemoglobin greater than 7.  Anemia panel will be ordered.    DVT prophylaxis: (Lovenox) Code Status: (Full code ) Family Communication: (none at bedside) Disposition:   Status is: Inpatient  Remains inpatient appropriate because:IV treatments appropriate due to intensity of illness or inability to take PO and Inpatient level of care appropriate due to severity of illness   Dispo: The patient is from: ALF              Anticipated d/c is to: SNF              Anticipated d/c date is: 2 days              Patient currently is not medically stable to d/c.       Consultants:   Ophthalmology  ID  Nephrology  Orthopedics.    Procedures:   None.   Antimicrobials:  Antibiotics Given (last 72 hours)    Date/Time Action Medication Dose Rate   10/21/19 0845 New Bag/Given   ceFEPIme (MAXIPIME) 2 g in sodium chloride 0.9 % 100 mL IVPB 2 g 200 mL/hr   10/21/19 1211 New Bag/Given   vancomycin (VANCOCIN) IVPB 1000 mg/200 mL premix 1,000 mg 200 mL/hr   10/22/19 0941 New Bag/Given   ceFEPIme  (MAXIPIME) 2 g in sodium chloride 0.9 % 100 mL IVPB 2 g 200 mL/hr   10/22/19 2008 New Bag/Given   DAPTOmycin (CUBICIN) 670 mg in sodium chloride 0.9 % IVPB 670 mg 226.8 mL/hr         Subjective: Denies any pain in the right foot. No chest pain or sob.   Objective: Vitals:   10/22/19 1100 10/22/19 1252 10/23/19 0800 10/23/19 1355  BP: 122/72   127/73  Pulse: 98   89  Resp: 15   16  Temp:  97.8 F (36.6 C)  (!) 97.4 F (36.3 C)  TempSrc:    Oral  SpO2: 100%  97% 100%  Weight:      Height:        Intake/Output Summary (Last 24 hours) at 10/23/2019 1602 Last data filed at 10/23/2019 1542 Gross per 24 hour  Intake 4335.86 ml  Output 3250 ml  Net 1085.86 ml   Filed Weights   10/20/19 2354  Weight: 83.9 kg    Examination:  General exam: Alert and comfortable, not in distress.  Respiratory system: Clear to auscultation, no wheezing or rhonchi.  Cardiovascular system:  S1S2, RRR, no JVD, no pedal edema Gastrointestinal system: abd is soft, non tender non distended. Bowel sounds wnl.  Central nervous system:alert and oriented. No new deficits.  Speech abnormality Extremities: Right sided soft tissue ulcer at the first metatarsal area, no purulent drainage.  Skin: no rashes seen.  Psychiatry: Mood is appropriate.     Data Reviewed: I have personally reviewed following labs and imaging studies  CBC: Recent Labs  Lab 10/21/19 0039 10/21/19 0046 10/22/19 0521 10/23/19 0919  WBC 8.5  --  6.4 6.6  NEUTROABS 6.4  --  4.0  --   HGB 9.2* 9.2* 8.6* 8.6*  HCT 29.4* 27.0* 26.8* 27.2*  MCV 84.0  --  84.3 85.5  PLT 280  --  249 678    Basic Metabolic Panel: Recent Labs  Lab 10/21/19 0039 10/21/19 0046 10/21/19 0724 10/22/19 0521 10/23/19 0919  NA 132* 134* 137 139 141  K 4.2 4.2 3.9 3.5 2.9*  CL 102 103 107 104 99  CO2 17*  --  17* 23 31  GLUCOSE 196* 191* 164* 153* 116*  BUN 51* 49* 44* 34* 27*  CREATININE 4.20* 4.60* 3.87* 3.26* 2.87*  CALCIUM 8.2*  --   7.6* 8.3* 8.0*  MG  --   --   --  1.6* 1.6*  PHOS  --   --   --   --  3.6    GFR: Estimated Creatinine Clearance: 30.3 mL/min (A) (by C-G formula based on SCr of 2.87 mg/dL (H)).  Liver Function Tests: Recent Labs  Lab 10/21/19 0039 10/21/19 0724 10/22/19 0521  AST 38 24 18  ALT 26 21 19   ALKPHOS 135* 118 104  BILITOT 0.9 0.7 0.5  PROT 7.6 6.3* 6.2*  ALBUMIN 3.1* 2.6* 2.5*    CBG: Recent Labs  Lab 10/22/19 1148 10/22/19 1653 10/22/19 2217 10/23/19 0738 10/23/19 1208  GLUCAP  188* 175* 161* 103* 181*     Recent Results (from the past 240 hour(s))  Urine culture     Status: Abnormal   Collection Time: 10/21/19  1:55 AM   Specimen: In/Out Cath Urine  Result Value Ref Range Status   Specimen Description   Final    IN/OUT CATH URINE Performed at [facility] [address], [place], [place] [address]    Special Requests   Final    NONE Performed at [facility], [place] [address] [place], [place] [address]    Culture 4,000 COLONIES/mL ESCHERICHIA COLI (A)  Final   Report Status 10/23/2019 FINAL  Final   Organism ID, Bacteria ESCHERICHIA COLI (A)  Final      Susceptibility   Escherichia coli - MIC*    AMPICILLIN <=2 SENSITIVE Sensitive     CEFAZOLIN <=4 SENSITIVE Sensitive     CEFTRIAXONE <=0.25 SENSITIVE Sensitive     CIPROFLOXACIN <=0.25 SENSITIVE Sensitive     GENTAMICIN <=1 SENSITIVE Sensitive     IMIPENEM <=0.25 SENSITIVE Sensitive     NITROFURANTOIN <=16 SENSITIVE Sensitive     TRIMETH/SULFA <=20 SENSITIVE Sensitive     AMPICILLIN/SULBACTAM <=2 SENSITIVE Sensitive     PIP/TAZO <=4 SENSITIVE Sensitive     * 4,000 COLONIES/mL ESCHERICHIA COLI  Respiratory Panel by RT PCR (Flu A&B, Covid) - Nasopharyngeal Swab     Status: None   Collection Time: 10/21/19  2:53 AM   Specimen: Nasopharyngeal Swab  Result Value Ref Range Status   SARS Coronavirus 2 by RT PCR NEGATIVE NEGATIVE Final    Comment: (NOTE) SARS-CoV-2 target  nucleic acids are NOT DETECTED.  The SARS-CoV-2 RNA is generally detectable in upper respiratoy specimens during the acute phase of infection. The lowest concentration of SARS-CoV-2 viral copies this assay can detect is 131 copies/mL. A negative result does not preclude SARS-Cov-2 infection and should not be used as the sole basis for treatment or other patient management decisions. A negative result may occur with  improper specimen collection/handling, submission of specimen other than nasopharyngeal swab, presence of viral mutation(s) within the areas targeted by this assay, and inadequate number of viral copies (<131 copies/mL). A negative result must be combined with clinical observations, patient history, and epidemiological information. The expected result is Negative.  Fact Sheet for Patients:  PinkCheek.be  Fact Sheet for Healthcare Providers:  GravelBags.it  This test is no t yet approved or cleared by the [place] FDA and  has been authorized for detection and/or diagnosis of SARS-CoV-2 by FDA under an Emergency Use Authorization (EUA). This EUA will remain  in effect (meaning this test can be used) for the duration of the COVID-19 declaration under Section 564(b)(1) of the Act, 21 U.S.C. section 360bbb-3(b)(1), unless the authorization is terminated or revoked sooner.     Influenza A by PCR NEGATIVE NEGATIVE Final   Influenza B by PCR NEGATIVE NEGATIVE Final    Comment: (NOTE) The Xpert Xpress SARS-CoV-2/FLU/RSV assay is intended as an aid in  the diagnosis of influenza from Nasopharyngeal swab specimens and  should not be used as a sole basis for treatment. Nasal washings and  aspirates are unacceptable for Xpert Xpress SARS-CoV-2/FLU/RSV  testing.  Fact Sheet for Patients: PinkCheek.be  Fact Sheet for Healthcare  Providers: GravelBags.it  This test is not yet approved or cleared by the [place] FDA and  has been authorized for detection and/or diagnosis of SARS-CoV-2 by  FDA under an Emergency Use Authorization (  EUA). This EUA will remain  in effect (meaning this test can be used) for the duration of the  Covid-19 declaration under Section 564(b)(1) of the Act, 21  U.S.C. section 360bbb-3(b)(1), unless the authorization is  terminated or revoked. Performed at [facility], [place] [address] [place], [place] [address]   Culture, blood (routine x 2)     Status: None (Preliminary result)   Collection Time: 10/21/19  7:24 AM   Specimen: BLOOD LEFT HAND  Result Value Ref Range Status   Specimen Description   Final    BLOOD LEFT HAND Performed at [facility] [address], [place], [place] [address]    Special Requests   Final    BOTTLES DRAWN AEROBIC ONLY Blood Culture adequate volume Performed at [facility] [address], [place], [place] [address]    Culture   Final    NO GROWTH 2 DAYS Performed at [facility] [address] [place], [place] [address]    Report Status PENDING  Incomplete  Culture, blood (routine x 2)     Status: None (Preliminary result)   Collection Time: 10/21/19  7:24 AM   Specimen: BLOOD  Result Value Ref Range Status   Specimen Description   Final    BLOOD LEFT THUMB Performed at [facility] [address], [place], [place] [address]    Special Requests   Final    BOTTLES DRAWN AEROBIC ONLY Blood Culture adequate volume Performed at [facility] [address], [place], [place] [address]    Culture   Final    NO GROWTH 2 DAYS Performed at [facility] [address], [place], [place] [address]    Report Status PENDING  Incomplete  C Difficile Quick Screen w PCR reflex     Status: None   Collection Time:  10/21/19 10:42 AM   Specimen: STOOL  Result Value Ref Range Status   C Diff antigen NEGATIVE NEGATIVE Final   C Diff toxin NEGATIVE NEGATIVE Final   C Diff interpretation No C. difficile detected.  Final    Comment: Performed at [facility], [place] [address] [place],  [address]         Radiology Studies: No results found.      Scheduled Meds: . aspirin  81 mg Oral Daily  . atropine  1 drop Right Eye BID  . Chlorhexidine Gluconate Cloth  6 each Topical Daily  . clopidogrel  75 mg Oral Daily  . [START ON 10/24/2019] enoxaparin (LOVENOX) injection  40 mg Subcutaneous Q24H  . gabapentin  300 mg Oral BID  . insulin aspart  0-15 Units Subcutaneous TID AC & HS  . insulin glargine  15 Units Subcutaneous Daily  . melatonin  10 mg Oral QHS  . natamycin  1 drop Right Eye Q2H while awake  . pantoprazole  40 mg Oral QPM  . potassium chloride  40 mEq Oral BID  . prednisoLONE acetate  1 drop Right Eye QID  . rosuvastatin  10 mg Oral QHS  . senna-docusate  1 tablet Oral Daily  . tamsulosin  0.4 mg Oral QPC breakfast  . tobramycin 15mg /mL (fortified) compounded eye drops  1 drop Right Eye Q2H  . traZODone  100 mg Oral QHS  . trimethoprim-polymyxin b  2 drop Right Eye Q6H  . Vancomycin 25mg /mL (fortified) compounded eye drops  1 drop Right Eye Q2H   Continuous Infusions: .  ceFEPime (MAXIPIME) IV 2 g (10/23/19 0813)  . DAPTOmycin (CUBICIN)  IV 670 mg (10/22/19 2008)  .  sodium bicarbonate (isotonic) infusion in sterile water 100 mL/hr at 10/22/19 2214     LOS: 1 day        Hosie Poisson, MD [facility]   To contact the attending provider between 7A-7P or the covering provider during after hours 7P-7A, please log into the web site [email or web address] and access using universal [place] password for that web site. If you do not have the password, please call the hospital operator.  10/23/2019, 4:02 PM

## 2019-10-24 ENCOUNTER — Encounter (HOSPITAL_COMMUNITY): Payer: Medicaid Other

## 2019-10-24 DIAGNOSIS — M86671 Other chronic osteomyelitis, right ankle and foot: Secondary | ICD-10-CM

## 2019-10-24 LAB — CBC
HCT: 29.3 % — ABNORMAL LOW (ref 39.0–52.0)
Hemoglobin: 9 g/dL — ABNORMAL LOW (ref 13.0–17.0)
MCH: 26.9 pg (ref 26.0–34.0)
MCHC: 30.7 g/dL (ref 30.0–36.0)
MCV: 87.5 fL (ref 80.0–100.0)
Platelets: 283 10*3/uL (ref 150–400)
RBC: 3.35 MIL/uL — ABNORMAL LOW (ref 4.22–5.81)
RDW: 12.9 % (ref 11.5–15.5)
WBC: 5.7 10*3/uL (ref 4.0–10.5)
nRBC: 0 % (ref 0.0–0.2)

## 2019-10-24 LAB — RETICULOCYTES
Immature Retic Fract: 21.3 % — ABNORMAL HIGH (ref 2.3–15.9)
RBC.: 3.37 MIL/uL — ABNORMAL LOW (ref 4.22–5.81)
Retic Count, Absolute: 24.9 10*3/uL (ref 19.0–186.0)
Retic Ct Pct: 0.7 % (ref 0.4–3.1)

## 2019-10-24 LAB — BASIC METABOLIC PANEL
Anion gap: 14 (ref 5–15)
BUN: 23 mg/dL — ABNORMAL HIGH (ref 6–20)
CO2: 30 mmol/L (ref 22–32)
Calcium: 8.4 mg/dL — ABNORMAL LOW (ref 8.9–10.3)
Chloride: 98 mmol/L (ref 98–111)
Creatinine, Ser: 2.62 mg/dL — ABNORMAL HIGH (ref 0.61–1.24)
GFR calc Af Amer: 30 mL/min — ABNORMAL LOW (ref >60)
GFR calc non Af Amer: 26 mL/min — ABNORMAL LOW (ref >60)
Glucose, Bld: 101 mg/dL — ABNORMAL HIGH (ref 70–99)
Potassium: 3.5 mmol/L (ref 3.5–5.1)
Sodium: 142 mmol/L (ref 135–145)

## 2019-10-24 LAB — GLUCOSE, CAPILLARY
Glucose-Capillary: 97 mg/dL (ref 70–99)
Glucose-Capillary: 219 mg/dL — ABNORMAL HIGH (ref 70–99)
Glucose-Capillary: 241 mg/dL — ABNORMAL HIGH (ref 70–99)
Glucose-Capillary: 196 mg/dL — ABNORMAL HIGH (ref 70–99)

## 2019-10-24 LAB — IRON AND TIBC
Iron: 47 ug/dL (ref 45–182)
Saturation Ratios: 24 % (ref 17.9–39.5)
TIBC: 197 ug/dL — ABNORMAL LOW (ref 250–450)
UIBC: 150 ug/dL

## 2019-10-24 LAB — FOLATE: Folate: 9 ng/mL (ref >5.9)

## 2019-10-24 LAB — MAGNESIUM: Magnesium: 1.5 mg/dL — ABNORMAL LOW (ref 1.7–2.4)

## 2019-10-24 LAB — FERRITIN: Ferritin: 378 ng/mL — ABNORMAL HIGH (ref 24–336)

## 2019-10-24 LAB — VITAMIN B12: Vitamin B-12: 1064 pg/mL — ABNORMAL HIGH (ref 180–914)

## 2019-10-24 MED ORDER — SODIUM CHLORIDE 0.9 % IV SOLN
2.0000 g | Freq: Two times a day (BID) | INTRAVENOUS | Status: DC
  Administered 2019-10-24 – 2019-10-26 (×4): 2 g via INTRAVENOUS
  Filled 2019-10-24 (×5): qty 2

## 2019-10-24 MED ORDER — MAGNESIUM SULFATE 2 GM/50ML IV SOLN
2.0000 g | Freq: Once | INTRAVENOUS | Status: AC
  Administered 2019-10-24: 2 g via INTRAVENOUS
  Filled 2019-10-24: qty 50

## 2019-10-24 MED ORDER — SODIUM CHLORIDE 0.9 % IV SOLN
700.0000 mg | Freq: Every day | INTRAVENOUS | Status: DC
  Administered 2019-10-24: 700 mg via INTRAVENOUS
  Filled 2019-10-24: qty 14

## 2019-10-24 NOTE — Consult Note (Addendum)
OPHTHALMOLOGY CONSULT NOTE  Date: 10/21/19 Time: 11:46 AM  Patient Name: Eric Morton  DOB: 07/16/1965 MRN: [id number]  Reason for Consult: Endophthalmitis Right eye  HPI:  This is a 54 y.o. 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 The nidus for the infection appears to have been a suture abscess in the cornea for which patient has a  Corneal ulcer.        Past Medical History:  Diagnosis Date  . 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 Anxiety   . Chronic kidney disease    "jsut a little damage"  . Glaucoma   . History of stomach ulcers    not confirmed, never underwent diagnostic endo or radiology, this manifested as epigastric distress.   [name] Hx of adenomatous colonic polyps   . Hypercholesterolemia   . Hypertension   . Peripheral neuropathy   . Polycythemia vera ([place]) 2006  . Renal insufficiency 07/16/2017  . Stroke [facility])    slurred speech  . Substance abuse ([place])   . Type II diabetes mellitus (HCC)     family history includes Diabetes in his mother; Hypertension in his mother.  Social History        Occupational History  . Not on file  Tobacco Use  . Smoking status: Former Smoker    Packs/day: 0.25    Years: 32.00    Pack years: 8.00    Types: Cigarettes  . Smokeless tobacco: Never Used  Vaping Use  . Vaping Use: Never used  Substance and Sexual Activity  . Alcohol use: Yes    Comment: 3/14 quit drinking 5 days ago and then drank last nite  . Drug use: Yes    Types: Marijuana, "Crack" cocaine    Comment: snorts and smokes cocaine ocassionally   . Sexual activity: Yes         Allergies  Allergen Reactions  . Lipitor [Atorvastatin] Other (See Comments)    Sister states had to have Cardiac Surgery    ROS: Positive as above, otherwise negative.  EXAM:  Mental Status: A&O x  3  Base Exam: Right Eye   Visual Acuity (At near) NLP   IOP ([place])    Pupillary Exam APD   Motility Full    Confrontation VF N/A    Anterior Segment Exam    Lids/Lashes WNL   Conjuctiva 1+ injection   Cornea approx 1.5-2 mm infiltrate without epi defect   Anterior Chamber Deep, approx 1 mm hypopyon inf.   Iris Mid dilated   Lens Posterior plaque   Vitreous     Poster Segment Exam    Disc No view, known dense vitreitis from prior b-scan   CD ratio    Macula    Vessels    Periphery     Radiographic Studies Reviewed:   Imaging Results (Last 48 hours)  DG Foot 2 Views Right  Result Date: 10/21/2019 CLINICAL DATA:  Right foot wound EXAM: RIGHT FOOT - 2 VIEW COMPARISON:  None. FINDINGS: Two view radiograph right foot demonstrates a soft tissue defect medial to the first metatarsal head with mild associated soft tissue swelling but no retained radiopaque foreign body or subjacent osseous erosion. Mild first ray bunion deformity. Mild degenerative arthritis at the first metatarsophalangeal joint. No acute fracture or dislocation. Remaining joint spaces are preserved. Vascular calcifications are seen within  the soft tissues. Mild right forefoot soft tissue swelling. No ankle effusion. IMPRESSION: Soft tissue ulceration and associated soft tissue swelling. No associated osseous erosion or retained radiopaque foreign body. Electronically Signed   By: Fidela Salisbury MD   On: 10/21/2019 00:44   CT Renal Stone Study  Result Date: 10/21/2019 CLINICAL DATA:  Acute renal failure, hypertension EXAM: CT ABDOMEN AND PELVIS WITHOUT CONTRAST TECHNIQUE: Multidetector CT imaging of the abdomen and pelvis was performed following the standard protocol without IV contrast. COMPARISON:  None. FINDINGS: Lower chest: The visualized lung bases are clear. Cardiac size within normal limits. No pericardial effusion. Hepatobiliary: No focal liver abnormality is seen. No  gallstones, gallbladder wall thickening, or biliary dilatation. Pancreas: Unremarkable Spleen: Unremarkable Adrenals/Urinary Tract: There is mild an in bilateral hydronephrosis and mild distention of the bladder which, together, may reflect changes of voluntary urinary retention or bladder outlet obstruction. The kidneys are normal in size and position. No intrarenal or ureteral calculi are identified. Mild bilateral nonspecific perinephric stranding is seen. The adrenal glands are unremarkable. Stomach/Bowel: The stomach, small bowel, and large bowel are unremarkable. Appendix normal. No free intraperitoneal gas or fluid. Vascular/Lymphatic: Mild aortoiliac atherosclerotic calcification without evidence of aneurysm. No pathologic adenopathy within the abdomen and pelvis. Reproductive: The prostate gland is normal in size. Seminal vesicles are unremarkable. Extensive calcification of the vas deferens is seen, a finding that can be seen the setting of longstanding diabetes mellitus. Other: Rectum unremarkable. Musculoskeletal: No acute bone abnormality. IMPRESSION: Mild bilateral hydronephrosis and moderate distention of the bladder suggesting changes of either voluntary retention or bladder outlet obstruction. Aortic Atherosclerosis (ICD10-I70.0). Electronically Signed   By: Fidela Salisbury MD   On: 10/21/2019 04:54     Assessment and Recommendation: 1. Endophthalmitis OD:              -Stable/improved appearance today             - s/p moxifloxacin injection into the anterior chamber (in clinic), Cultures taken  but no growth to date.             Rec- ABX per ID             - Needs to continue fortified topical antibiotics  Q2H                         - Fortified Vacomycin -25 mg/ml                          - Fortified Tobramycin - 15 mg/ml                         -Natamycin ophthalmic - Q 2H                    #At discharge please give patient all bottles.   -Considering Vitreous antibiotic injection  at bedside depending upon culture growth today.                           -If blood cultures lead to concern for bacteremia or sepsis, pt needs transfer to [facility]   for enucleation/eviceration.              Please call with any questions.  Jola Schmidt MD [facility] Ophthalmology [phone]

## 2019-10-24 NOTE — Progress Notes (Signed)
[facility] Infectious [facility]  Date of Admission:  10/20/2019   Total days of antibiotics: 4         Current antibiotics:         Day 4 cefepime        Day 3 daptomycin  Reason for visit: Follow up on DFU, endophthalmitis  Assessment: Eric Morton is a 54 y.o. male.  He has a hx of DM, CKD stage 3, CVA with debility, HTN admitted 10/21/19 with progressive weakness in the setting of a right foot ulcer for approx 6 weeks and worsening pain for 2 weeks.  Found to have AKI on admission.  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   Orthopedics has seen and recommends continued local wound care.  Patient not currently interested in ray amputation.  Plan is to follow up with Dr [name] at discharge.   Recommendations: -- continue with daptomycin and cefepime dosed for renal function -- CK weekly (baseline = 128; 10/22/19) -- endophthalmitis management per ophthalmology -- currently no surgical management per ortho -- vascular eval -- needs line for abx.  Preferably Hickman d/t CKD -- will check baseline ESR/CRP -- wound care -- glycemic control -- monitor renal function    Principal Problem:   Diabetic infection of right foot ([place]) Active Problems:   Essential hypertension   History of CVA (cerebrovascular accident)   Hyperlipidemia associated with type 2 diabetes mellitus ([place])   Generalized weakness   Acute renal failure superimposed on stage 3b chronic kidney disease ([place])   Acute urinary retention   GERD without esophagitis   Bacterial conjunctivitis of right eye   Osteomyelitis (HCC)    Scheduled Meds: . aspirin  81 mg Oral Daily  . atropine  1 drop Right Eye BID  . Chlorhexidine Gluconate Cloth  6 each Topical Daily  . clopidogrel  75 mg Oral Daily  . enoxaparin (LOVENOX) injection  40 mg Subcutaneous Q24H  . gabapentin  300 mg Oral BID  . insulin aspart  0-15 Units Subcutaneous TID AC & HS  . insulin  glargine  15 Units Subcutaneous Daily  . melatonin  10 mg Oral QHS  . natamycin  1 drop Right Eye Q2H while awake  . pantoprazole  40 mg Oral QPM  . prednisoLONE acetate  1 drop Right Eye QID  . rosuvastatin  10 mg Oral QHS  . senna-docusate  1 tablet Oral Daily  . tamsulosin  0.4 mg Oral QPC breakfast  . tobramycin 29m/mL (fortified) compounded eye drops  1 drop Right Eye Q2H  . traZODone  100 mg Oral QHS  . trimethoprim-polymyxin b  2 drop Right Eye Q6H  . Vancomycin 279mmL (fortified) compounded eye drops  1 drop Right Eye Q2H   Continuous Infusions: . ceFEPime (MAXIPIME) IV 2 g (10/24/19 0920)  . DAPTOmycin (CUBICIN)  IV 670 mg (10/22/19 2008)  .  sodium bicarbonate (isotonic) infusion in sterile water 100 mL/hr at 10/24/19 0600   PRN Meds:.acetaminophen **OR** acetaminophen, ondansetron **OR** ondansetron (ZOFRAN) IV, polyethylene glycol  Subjective: Pt has no new complaints today. Afebrile Tolerating Abx Pain controlled  Allergies  Allergen Reactions  . Lipitor [Atorvastatin] Other (See Comments)    Sister states had to have Cardiac Surgery     Review of Systems: Review of Systems  Constitutional: Negative for chills and fever.  Eyes:       Stable vision.  Respiratory: Negative  for cough and shortness of breath.   Cardiovascular: Negative for chest pain.  Musculoskeletal:       Pain is controlled.   Skin: Negative for rash.  All other systems reviewed and are negative.    Objective: Blood pressure 135/76, pulse 73, temperature 98.5 F (36.9 C), temperature source Oral, resp. rate 14, height _0  (1.727 m), weight 83.9 kg, SpO2 96 %. Body mass index is 28.13 kg/m.  Physical Exam Constitutional:      Comments: Chronically ill appearing sitting up in bed.   HENT:     Head: Normocephalic and atraumatic.     Right Ear: External ear normal.     Left Ear: External ear normal.     Nose: Nose normal.  Eyes:     Comments: Left eye appears normal. Right  eye is injected and swollen.     Cardiovascular:     Comments: Pulses are diminished.  Pulmonary:     Effort: Pulmonary effort is normal.  Skin:    General: Skin is warm and dry.  Neurological:     Mental Status: He is alert.       Lab Results: Lab Results  Component Value Date   WBC 5.7 10/24/2019   HGB 9.0 (L) 10/24/2019   HCT 29.3 (L) 10/24/2019   MCV 87.5 10/24/2019   PLT 283 10/24/2019    Lab Results  Component Value Date   CREATININE 2.62 (H) 10/24/2019   BUN 23 (H) 10/24/2019   NA 142 10/24/2019   K 3.5 10/24/2019   CL 98 10/24/2019   CO2 30 10/24/2019    Lab Results  Component Value Date   ALT 19 10/22/2019   AST 18 10/22/2019   ALKPHOS 104 10/22/2019     Microbiology: Recent Results (from the past 240 hour(s))  Urine culture     Status: Abnormal   Collection Time: 10/21/19  1:55 AM   Specimen: In/Out Cath Urine  Result Value Ref Range Status   Specimen Description   Final    IN/OUT CATH URINE Performed at [facility], [place] [address], [place], [place] [address]    Special Requests   Final    NONE Performed at [facility], [place] [address], [place], [place] [address]    Culture 4,000 COLONIES/mL ESCHERICHIA COLI (A)  Final   Report Status 10/23/2019 FINAL  Final   Organism ID, Bacteria ESCHERICHIA COLI (A)  Final      Susceptibility   Escherichia coli - MIC*    AMPICILLIN <=2 SENSITIVE Sensitive     CEFAZOLIN <=4 SENSITIVE Sensitive     CEFTRIAXONE <=0.25 SENSITIVE Sensitive     CIPROFLOXACIN <=0.25 SENSITIVE Sensitive     GENTAMICIN <=1 SENSITIVE Sensitive     IMIPENEM <=0.25 SENSITIVE Sensitive     NITROFURANTOIN <=16 SENSITIVE Sensitive     TRIMETH/SULFA <=20 SENSITIVE Sensitive     AMPICILLIN/SULBACTAM <=2 SENSITIVE Sensitive     PIP/TAZO <=4 SENSITIVE Sensitive     * 4,000 COLONIES/mL ESCHERICHIA COLI  Respiratory Panel by RT PCR (Flu A&B, Covid) - Nasopharyngeal Swab     Status: None   Collection  Time: 10/21/19  2:53 AM   Specimen: Nasopharyngeal Swab  Result Value Ref Range Status   SARS Coronavirus 2 by RT PCR NEGATIVE NEGATIVE Final    Comment: (NOTE) SARS-CoV-2 target nucleic acids are NOT DETECTED.  The SARS-CoV-2 RNA is generally detectable in upper respiratoy specimens during the acute phase of infection. The lowest concentration  of SARS-CoV-2 viral copies this assay can detect is 131 copies/mL. A negative result does not preclude SARS-Cov-2 infection and should not be used as the sole basis for treatment or other patient management decisions. A negative result may occur with  improper specimen collection/handling, submission of specimen other than nasopharyngeal swab, presence of viral mutation(s) within the areas targeted by this assay, and inadequate number of viral copies (<131 copies/mL). A negative result must be combined with clinical observations, patient history, and epidemiological information. The expected result is Negative.  Fact Sheet for Patients:  PinkCheek.be  Fact Sheet for Healthcare Providers:  GravelBags.it  This test is no t yet approved or cleared by the [place] FDA and  has been authorized for detection and/or diagnosis of SARS-CoV-2 by FDA under an Emergency Use Authorization (EUA). This EUA will remain  in effect (meaning this test can be used) for the duration of the COVID-19 declaration under Section 564(b)(1) of the Act, 21 U.S.C. section 360bbb-3(b)(1), unless the authorization is terminated or revoked sooner.     Influenza A by PCR NEGATIVE NEGATIVE Final   Influenza B by PCR NEGATIVE NEGATIVE Final    Comment: (NOTE) The Xpert Xpress SARS-CoV-2/FLU/RSV assay is intended as an aid in  the diagnosis of influenza from Nasopharyngeal swab specimens and  should not be used as a sole basis for treatment. Nasal washings and  aspirates are unacceptable for Xpert Xpress  SARS-CoV-2/FLU/RSV  testing.  Fact Sheet for Patients: PinkCheek.be  Fact Sheet for Healthcare Providers: GravelBags.it  This test is not yet approved or cleared by the [place] FDA and  has been authorized for detection and/or diagnosis of SARS-CoV-2 by  FDA under an Emergency Use Authorization (EUA). This EUA will remain  in effect (meaning this test can be used) for the duration of the  Covid-19 declaration under Section 564(b)(1) of the Act, 21  U.S.C. section 360bbb-3(b)(1), unless the authorization is  terminated or revoked. Performed at [facility], [place] [address] [place], [place] [address]   Culture, blood (routine x 2)     Status: None (Preliminary result)   Collection Time: 10/21/19  7:24 AM   Specimen: BLOOD LEFT HAND  Result Value Ref Range Status   Specimen Description   Final    BLOOD LEFT HAND Performed at [facility] [address], [place], [place] [address]    Special Requests   Final    BOTTLES DRAWN AEROBIC ONLY Blood Culture adequate volume Performed at [facility] [address], [place], [place] [address]    Culture   Final    NO GROWTH 3 DAYS Performed at [facility], [place] [address], [place], [place] [address]    Report Status PENDING  Incomplete  Culture, blood (routine x 2)     Status: None (Preliminary result)   Collection Time: 10/21/19  7:24 AM   Specimen: BLOOD  Result Value Ref Range Status   Specimen Description   Final    BLOOD LEFT THUMB Performed at [facility] [address], [place], [place] [address]    Special Requests   Final    BOTTLES DRAWN AEROBIC ONLY Blood Culture adequate volume Performed at [facility] [address] [place], [place] [address]    Culture   Final    NO GROWTH 3 DAYS Performed at [facility], [place] [address] [place],  [place] [address]    Report Status PENDING  Incomplete  C Difficile Quick Screen w PCR reflex     Status: None   Collection Time: 10/21/19 10:42 AM   Specimen: STOOL  Result Value Ref Range Status   C Diff antigen NEGATIVE NEGATIVE Final   C Diff toxin NEGATIVE NEGATIVE Final   C Diff interpretation No C. difficile detected.  Final    Comment: Performed at [facility], [place] [address] [place], [place] [address]     [facility] [phone] pager 10/24/2019, 10:01 AM

## 2019-10-24 NOTE — Progress Notes (Signed)
PROGRESS NOTE    Eric Morton  WGN:[id number] DOB: 07-19-1965 DOA: 10/20/2019 PCP: [name], NP    No chief complaint on file.   Brief Narrative:  54 year old gentleman prior history of stage IIIb CKD, hypertension, hyperlipidemia, type 2 diabetes mellitus, insulin-dependent diabetes, multiple previous strokes with significant debility presents to ED for progressive weakness. 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  He was started on IV daptomycin by infectious disease.  Possible osteomyelitis of the right foot.  Orthopedics consulted and recommended no surgical intervention at this time. Patient seen and examined at bedside.  He currently denies any new complaints Assessment & Plan:   Principal Problem:   Acute renal failure superimposed on stage 3a chronic kidney disease (HCC) Active Problems:   Essential hypertension   Diabetic infection of right foot (HCC)   History of CVA (cerebrovascular accident)   Hyperlipidemia associated with type 2 diabetes mellitus ([place])   Generalized weakness   Acute urinary retention   GERD without esophagitis   Bacterial conjunctivitis of right eye   Osteomyelitis (HCC)   Endophthalmitis of the right eye Improving , ophthalmology on board and recommended fortified vancomycin, tobramycin and natamycin eyedrops. Recommend outpatient follow-up with ophthalmology on discharge. Patient denies any new complaints   Right sided diabetic foot ulcer with possible osteomyelitis of the first metatarsal head: -Wound care consulted for dressing changes. -Patient on broad-spectrum IV antibiotics and ID on board for duration of antibiotics. -Pain control and physical therapy. - orthopedics consulted, recommended local wound care with dressing changes, and plan for first ray amputation if the wound worsens, and outpatient follow up with Dr [name].    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 Patient on Plavix at home was initially held for possible procedure but has been resumed .  No neuro deficits at this time   Acute on stage IIIa CKD Baseline creatinine between 1.6-2.2, admitted with a creatinine of 4.2 with improvement to 3.8 to 2.87 to 2.6 with fluids.  Foley catheter placed for mild bilateral hydronephrosis evident on CT. Nephrology consulted and appreciate recommendations,..   Essential hypertension Pressure parameters are very well controlled   Hyperlipidemia Continue with statin   GERD Stable, on PPI.    Diabetes mellitus, insulin-dependent CBG (last 3)  Recent Labs    10/23/19 2054 10/24/19 0723 10/24/19 1134  GLUCAP 127* 97 219*   Resume sliding scale insulin and Lantus 15 units daily.  No changes in medications   Acute urinary retention Foley catheter placed probably secondary to BPH. Continue with Flomax.   Anemia of acute illness superimposed on Anemia of chronic disease Baseline hemoglobin is 10 , stabilized between 8-9 Transfuse to keep hemoglobin greater than 7.  Anemia panel reviewed adequate iron levels and vitamin B12 and folate levels.   Hypokalemia and hypomagnesemia Replaced   DVT prophylaxis: (Lovenox) Code Status: (Full code ) Family Communication: (none at bedside) Disposition:   Status is: Inpatient  Remains inpatient appropriate because:IV treatments appropriate due to intensity of illness or inability to take PO and Inpatient level of care appropriate due to severity of illness   Dispo: The patient is from: ALF  Anticipated d/c is to: SNF              Anticipated d/c date is: 2 days              Patient currently is not medically stable to d/c.        Consultants:   Ophthalmology  ID  Nephrology  Orthopedics.    Procedures:   None.   Antimicrobials:  Antibiotics Given (last 72 hours)    Date/Time Action Medication Dose Rate   10/22/19 0941 New Bag/Given   ceFEPIme (MAXIPIME) 2 g in sodium chloride 0.9 % 100 mL IVPB 2 g 200 mL/hr   10/22/19 2008 New Bag/Given   DAPTOmycin (CUBICIN) 670 mg in sodium chloride 0.9 % IVPB 670 mg 226.8 mL/hr   10/24/19 0920 New Bag/Given   ceFEPIme (MAXIPIME) 2 g in sodium chloride 0.9 % 100 mL IVPB 2 g 200 mL/hr         Subjective: No new complaints by the patient  Objective: Vitals:   10/24/19 0612 10/24/19 0700 10/24/19 0800 10/24/19 1137  BP: 135/76   129/77  Pulse: 73   76  Resp: 14   16  Temp: 98.5 F (36.9 C)   98 F (36.7 C)  TempSrc: Oral   Oral  SpO2: 96% 96% 98% 98%  Weight:      Height:        Intake/Output Summary (Last 24 hours) at 10/24/2019 1531 Last data filed at 10/24/2019 1320 Gross per 24 hour  Intake 2964.57 ml  Output 3175 ml  Net -210.43 ml   Filed Weights   10/20/19 2354  Weight: 83.9 kg    Examination:  General exam: Alert and comfortable, not in any kind of distress Respiratory system: Clear to auscultation bilaterally, no wheezing or rhonchi Cardiovascular system: S1-S2 heard, regular rate rhythm, no JVD Gastrointestinal system: Abdomen is soft, nontender, nondistended, bowel sounds normal Central nervous system :alert and oriented, no neuro deficits, some residual dysarthria from old strokes extremities: Right sided soft tissue ulcer at the first metatarsal area foul-smelling drainage present Skin: No rashes seen Psychiatry: Mood is appropriate    Data Reviewed: I have personally reviewed following labs and imaging studies  CBC: Recent Labs  Lab 10/21/19 0039 10/21/19 0046 10/22/19 0521 10/23/19 0919 10/24/19 0829  WBC 8.5  --  6.4 6.6 5.7  NEUTROABS 6.4  --  4.0  --   --   HGB 9.2* 9.2* 8.6* 8.6* 9.0*  HCT 29.4* 27.0* 26.8*  27.2* 29.3*  MCV 84.0  --  84.3 85.5 87.5  PLT 280  --  249 279 196    Basic Metabolic Panel: Recent Labs  Lab 10/21/19 0039 10/21/19 0039 10/21/19 0046 10/21/19 0724 10/22/19 0521 10/23/19 0919 10/24/19 0829  NA 132*   < > 134* 137 139 141 142  K 4.2   < > 4.2 3.9 3.5 2.9* 3.5  CL 102   < > 103 107 104 99 98  CO2 17*  --   --  17* 23 31 30   GLUCOSE 196*   < > 191* 164* 153* 116* 101*  BUN 51*   < > 49* 44* 34* 27* 23*  CREATININE 4.20*   < > 4.60* 3.87* 3.26* 2.87* 2.62*  CALCIUM 8.2*  --   --  7.6* 8.3* 8.0* 8.4*  MG  --   --   --   --  1.6* 1.6* 1.5*  PHOS  --   --   --   --   --  3.6  --    < > = values in this interval not displayed.    GFR: Estimated Creatinine Clearance: 33.2 mL/min (A) (by C-G formula based on SCr of 2.62 mg/dL (H)).  Liver Function Tests: Recent Labs  Lab 10/21/19 0039 10/21/19 0724 10/22/19 0521  AST 38 24 18  ALT 26 21 19   ALKPHOS 135* 118 104  BILITOT 0.9 0.7 0.5  PROT 7.6 6.3* 6.2*  ALBUMIN 3.1* 2.6* 2.5*    CBG: Recent Labs  Lab 10/23/19 1208 10/23/19 1725 10/23/19 2054 10/24/19 0723 10/24/19 1134  GLUCAP 181* 160* 127* 97 219*     Recent Results (from the past 240 hour(s))  Urine culture     Status: Abnormal   Collection Time: 10/21/19  1:55 AM   Specimen: In/Out Cath Urine  Result Value Ref Range Status   Specimen Description   Final    IN/OUT CATH URINE Performed at [facility], [place] [address], [place], [place] [address]    Special Requests   Final    NONE Performed at [facility], [place] [address] [place], [place] [address]    Culture 4,000 COLONIES/mL ESCHERICHIA COLI (A)  Final   Report Status 10/23/2019 FINAL  Final   Organism ID, Bacteria ESCHERICHIA COLI (A)  Final      Susceptibility   Escherichia coli - MIC*    AMPICILLIN <=2 SENSITIVE Sensitive     CEFAZOLIN <=4 SENSITIVE Sensitive     CEFTRIAXONE <=0.25 SENSITIVE Sensitive     CIPROFLOXACIN <=0.25 SENSITIVE  Sensitive     GENTAMICIN <=1 SENSITIVE Sensitive     IMIPENEM <=0.25 SENSITIVE Sensitive     NITROFURANTOIN <=16 SENSITIVE Sensitive     TRIMETH/SULFA <=20 SENSITIVE Sensitive     AMPICILLIN/SULBACTAM <=2 SENSITIVE Sensitive     PIP/TAZO <=4 SENSITIVE Sensitive     * 4,000 COLONIES/mL ESCHERICHIA COLI  Respiratory Panel by RT PCR (Flu A&B, Covid) - Nasopharyngeal Swab     Status: None   Collection Time: 10/21/19  2:53 AM   Specimen: Nasopharyngeal Swab  Result Value Ref Range Status   SARS Coronavirus 2 by RT PCR NEGATIVE NEGATIVE Final    Comment: (NOTE) SARS-CoV-2 target nucleic acids are NOT DETECTED.  The SARS-CoV-2 RNA is generally detectable in upper respiratoy specimens during the acute phase of infection. The lowest concentration of SARS-CoV-2 viral copies this assay can detect is 131 copies/mL. A negative result does not preclude SARS-Cov-2 infection and should not be used as the sole basis for treatment or other patient management decisions. A negative result may occur with  improper specimen collection/handling, submission of specimen other than nasopharyngeal swab, presence of viral mutation(s) within the areas targeted by this assay, and inadequate number of viral copies (<131 copies/mL). A negative result must be combined with clinical observations, patient history, and epidemiological information. The expected result is Negative.  Fact Sheet for Patients:  PinkCheek.be  Fact Sheet for Healthcare Providers:  GravelBags.it  This test is no t yet approved or cleared by the [place] FDA and  has been authorized for detection and/or diagnosis of SARS-CoV-2 by FDA under an Emergency Use Authorization (EUA). This EUA will remain  in effect (meaning this test can be used) for the duration of the COVID-19 declaration under Section 564(b)(1) of the Act, 21 U.S.C. section 360bbb-3(b)(1), unless the authorization  is terminated or revoked sooner.     Influenza A by PCR NEGATIVE NEGATIVE Final   Influenza B by PCR  NEGATIVE NEGATIVE Final    Comment: (NOTE) The Xpert Xpress SARS-CoV-2/FLU/RSV assay is intended as an aid in  the diagnosis of influenza from Nasopharyngeal swab specimens and  should not be used as a sole basis for treatment. Nasal washings and  aspirates are unacceptable for Xpert Xpress SARS-CoV-2/FLU/RSV  testing.  Fact Sheet for Patients: PinkCheek.be  Fact Sheet for Healthcare Providers: GravelBags.it  This test is not yet approved or cleared by the [place] FDA and  has been authorized for detection and/or diagnosis of SARS-CoV-2 by  FDA under an Emergency Use Authorization (EUA). This EUA will remain  in effect (meaning this test can be used) for the duration of the  Covid-19 declaration under Section 564(b)(1) of the Act, 21  U.S.C. section 360bbb-3(b)(1), unless the authorization is  terminated or revoked. Performed at [facility], [place] [address] [place], [place] [address]   Culture, blood (routine x 2)     Status: None (Preliminary result)   Collection Time: 10/21/19  7:24 AM   Specimen: BLOOD LEFT HAND  Result Value Ref Range Status   Specimen Description   Final    BLOOD LEFT HAND Performed at [facility] [address], [place], [place] [address]    Special Requests   Final    BOTTLES DRAWN AEROBIC ONLY Blood Culture adequate volume Performed at [facility] [address] [place], [place] [address]    Culture   Final    NO GROWTH 3 DAYS Performed at [facility], [place] [address], [place], [place] [address]    Report Status PENDING  Incomplete  Culture, blood (routine x 2)     Status: None (Preliminary result)   Collection Time: 10/21/19  7:24 AM   Specimen: BLOOD  Result Value Ref Range Status   Specimen Description   Final      BLOOD LEFT THUMB Performed at [facility] [address] [place], [place] [address]    Special Requests   Final    BOTTLES DRAWN AEROBIC ONLY Blood Culture adequate volume Performed at [facility] [address] [place], [place] [address]    Culture   Final    NO GROWTH 3 DAYS Performed at [facility], [place] [address], [place], [place] [address]    Report Status PENDING  Incomplete  C Difficile Quick Screen w PCR reflex     Status: None   Collection Time: 10/21/19 10:42 AM   Specimen: STOOL  Result Value Ref Range Status   C Diff antigen NEGATIVE NEGATIVE Final   C Diff toxin NEGATIVE NEGATIVE Final   C Diff interpretation No C. difficile detected.  Final    Comment: Performed at [facility], [place] [address], [place], [place] [address]         Radiology Studies: No results found.      Scheduled Meds: . aspirin  81 mg Oral Daily  . atropine  1 drop Right Eye BID  . Chlorhexidine Gluconate Cloth  6 each Topical Daily  . clopidogrel  75 mg Oral Daily  . enoxaparin (LOVENOX) injection  40 mg Subcutaneous Q24H  . gabapentin  300 mg Oral BID  . insulin aspart  0-15 Units Subcutaneous TID AC & HS  . insulin glargine  15 Units Subcutaneous Daily  . melatonin  10 mg Oral QHS  . natamycin  1 drop Right Eye Q2H while awake  . pantoprazole  40  mg Oral QPM  . prednisoLONE acetate  1 drop Right Eye QID  . rosuvastatin  10 mg Oral QHS  . senna-docusate  1 tablet Oral Daily  . tamsulosin  0.4 mg Oral QPC breakfast  . tobramycin 15mg /mL (fortified) compounded eye drops  1 drop Right Eye Q2H  . traZODone  100 mg Oral QHS  . trimethoprim-polymyxin b  2 drop Right Eye Q6H  . Vancomycin 25mg /mL (fortified) compounded eye drops  1 drop Right Eye Q2H   Continuous Infusions: . ceFEPime (MAXIPIME) IV    . DAPTOmycin (CUBICIN)  IV    .  sodium bicarbonate (isotonic) infusion in sterile water 100 mL/hr at 10/24/19  0839     LOS: 2 days        Hosie Poisson, MD [facility]   To contact the attending provider between 7A-7P or the covering provider during after hours 7P-7A, please log into the web site [email or web address] and access using universal [place] password for that web site. If you do not have the password, please call the hospital operator.  10/24/2019, 3:31 PM

## 2019-10-24 NOTE — Progress Notes (Signed)
Physical Therapy Treatment Patient Details Name: Eric Morton MRN: [id number] DOB: [birth year] Today's Date: 10/24/2019    History of Present Illness 54 year old gentleman prior history of stage IIIb CKD, hypertension, hyperlipidemia, type 2 diabetes mellitus, insulin-dependent diabetes, multiple previous strokes with significant debility presents to ED for progressive weakness. 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 See below for mobility. Continue to recommend SNF  Follow Up Recommendations  SNF;Supervision/Assistance - 24 hour;Supervision for mobility/OOB     Equipment Recommendations  None recommended by PT    Recommendations for Other Services       Precautions / Restrictions Precautions Precautions: Fall;Other (comment) Precaution Comments: 1 fall 6 weeks ago 2* RW "gave away"; orthostatic, dizzy in standing on eval  Restrictions Weight Bearing Restrictions: No    Mobility  Bed Mobility Overal bed mobility: Needs Assistance Bed Mobility: Supine to Sit     Supine to sit: Min guard     General bed mobility comments: HOB up, increased time and effort, min/guard assist to elevate trunk  Transfers Overall transfer level: Needs assistance Equipment used: Rolling walker (2 wheeled) Transfers: Sit to/from [facility] Sit to Stand: Min assist Stand pivot transfers: Min assist       General transfer comment: sit to stand x2,  able to take a few steps fwd and back with wide BOS,  min assist balance and maneuver RW to complete stand pivot  Ambulation/Gait             General Gait Details: too fatigued    Stairs             Wheelchair Mobility    Modified Rankin (Stroke Patients Only)       Balance                                            Cognition  Arousal/Alertness: Awake/alert Behavior During Therapy: WFL for tasks assessed/performed Overall Cognitive Status: Within Functional Limits for tasks assessed                                 General Comments: word finding difficult/aphasia/incr time needed       Exercises      General Comments        Pertinent Vitals/Pain Pain Assessment: 0-10 Pain Score: 3  Pain Location: R eye pain, pain with urination Pain Descriptors / Indicators: Grimacing;Burning Pain Intervention(s): Limited activity within patient's tolerance;Monitored during session;Repositioned    Home Living                      Prior Function            PT Goals (current goals can now be found in the care plan section) Acute Rehab PT Goals Patient Stated Goal: be able to walk, not get dizzy PT Goal Formulation: With patient Time For Goal Achievement: 11/05/19 Potential to Achieve Goals: Good Progress towards PT goals: Progressing toward goals    Frequency    Min 2X/week      PT Plan Current plan remains appropriate    Co-evaluation  AM-PAC PT "6 Clicks" Mobility   Outcome Measure  Help needed turning from your back to your side while in a flat bed without using bedrails?: A Little Help needed moving from lying on your back to sitting on the side of a flat bed without using bedrails?: A Little Help needed moving to and from a bed to a chair (including a wheelchair)?: A Little Help needed standing up from a chair using your arms (e.g., wheelchair or bedside chair)?: A Little Help needed to walk in hospital room?: A Lot Help needed climbing 3-5 steps with a railing? : Total 6 Click Score: 15    End of Session Equipment Utilized During Treatment: Gait belt Activity Tolerance: Patient tolerated treatment well;Patient limited by fatigue Patient left: in chair;with call bell/phone within reach;with chair alarm set   PT Visit Diagnosis: Unsteadiness on feet  (R26.81);History of falling (Z91.81);Difficulty in walking, not elsewhere classified (R26.2)     Time: 7034-0352 PT Time Calculation (min) (ACUTE ONLY): 19 min  Charges:  $Therapeutic Activity: 8-22 mins                     Baxter Flattery, PT  Acute Rehab Dept ([facility]) [phone] Pager [phone]  10/24/2019    [facility] ([place] 10/24/2019, 3:12 PM

## 2019-10-24 NOTE — Progress Notes (Addendum)
Pharmacy Antibiotic Note  Eric Morton is a 54 y.o. male admitted on 10/20/2019 with a diabetic foot infection  Pharmacy has been consulted for daptomycin dosing. Baseline SCr appears to be ~2.0  Today, 10/24/2019:  D4 abx  SCr continues to improve slowly; not yet back to baseline  Remains afebrile, WBC stable WNL   Plan:  Adjust daptomycin to ~8 mg/kg q24  Adjust cefepime to 2g IV q12 hr  Weekly CK while on daptomycin (continue statin for now; would hold if CK begins to rise acutely)  Pharmacy will sign off note-writing, following peripherally for dose adjustments, LOT, and abx selection at discharge (OPAT orders)  Height: 5\' 8"  (172.7 cm) Weight: 83.9 kg (185 lb) IBW/kg (Calculated) : 68.4  Temp (24hrs), Avg:98.1 F (36.7 C), Min:97.4 F (36.3 C), Max:98.6 F (37 C)  Recent Labs  Lab 10/21/19 0039 10/21/19 0039 10/21/19 0046 10/21/19 0724 10/22/19 0521 10/23/19 0919 10/24/19 0829  WBC 8.5  --   --   --  6.4 6.6 5.7  CREATININE 4.20*   < > 4.60* 3.87* 3.26* 2.87* 2.62*  LATICACIDVEN 1.1  --   --  0.7  --   --   --    < > = values in this interval not displayed.    Estimated Creatinine Clearance: 33.2 mL/min (A) (by C-G formula based on SCr of 2.62 mg/dL (H)).    Allergies  Allergen Reactions  . Lipitor [Atorvastatin] Other (See Comments)    Sister states had to have Cardiac Surgery   Antimicrobials this admission: 9/24 vanc >> 9/25 daptomycin >> 9/24 cefepime >>  Dose adjustments/levels this admission: 9/27 Dapto 670 q48 >> 700 q24 (improved SCr) 9/27 cefepime 2 q24 >> 2 q12  Microbiology results: 9/24 BCx: ngtd 9/24 UCx: 40K Ecoli - pan-sensitive 9/24 Cdiff: negative 9/24 COVID, flu: negative   Thank you for allowing pharmacy to be a part of this patient's care.  Reuel Boom, PharmD, BCPS [phone] 10/24/2019, 11:56 AM

## 2019-10-24 NOTE — TOC Progression Note (Addendum)
Transition of Care [facility]) - Progression Note    Patient Details  Name: Eric Morton MRN: [id number] Date of Birth: October 26, 1965  [facility] CM/SW Contact  Breniyah Romm, Juliann Pulse, RN Phone Number: 10/24/2019, 2:35 PM  Clinical Narrative:  Patient chose [facility] in [place]-SNF-TC per patient request his sister [name]-line busy 3 times. TC [facility] will start auth. Await PAC placement for long term iv abx.  4:30p-[facility] unable to provide abx @ SNF too expensive recc a more affordable abx based on sensitivities.MD updated.    Expected Discharge Plan: Skilled Nursing Facility Barriers to Discharge: Insurance Authorization  Expected Discharge Plan and Services Expected Discharge Plan: [place]   Discharge Planning Services: CM Consult   Living arrangements for the past 2 months: Assisted Living Facility                                       Social Determinants of Health (SDOH) Interventions    Readmission Risk Interventions No flowsheet data found.

## 2019-10-25 ENCOUNTER — Encounter (HOSPITAL_COMMUNITY): Payer: Medicaid Other

## 2019-10-25 ENCOUNTER — Inpatient Hospital Stay (HOSPITAL_COMMUNITY): Payer: Medicaid Other

## 2019-10-25 HISTORY — PX: IR FLUORO GUIDE CV LINE RIGHT: IMG2283

## 2019-10-25 HISTORY — PX: IR US GUIDE VASC ACCESS RIGHT: IMG2390

## 2019-10-25 LAB — GLUCOSE, CAPILLARY
Glucose-Capillary: 98 mg/dL (ref 70–99)
Glucose-Capillary: 204 mg/dL — ABNORMAL HIGH (ref 70–99)
Glucose-Capillary: 282 mg/dL — ABNORMAL HIGH (ref 70–99)
Glucose-Capillary: 270 mg/dL — ABNORMAL HIGH (ref 70–99)

## 2019-10-25 LAB — BASIC METABOLIC PANEL
Anion gap: 11 (ref 5–15)
BUN: 19 mg/dL (ref 6–20)
CO2: 30 mmol/L (ref 22–32)
Calcium: 8.1 mg/dL — ABNORMAL LOW (ref 8.9–10.3)
Chloride: 98 mmol/L (ref 98–111)
Creatinine, Ser: 1.99 mg/dL — ABNORMAL HIGH (ref 0.61–1.24)
GFR calc Af Amer: 42 mL/min — ABNORMAL LOW (ref >60)
GFR calc non Af Amer: 36 mL/min — ABNORMAL LOW (ref >60)
Glucose, Bld: 144 mg/dL — ABNORMAL HIGH (ref 70–99)
Potassium: 3.2 mmol/L — ABNORMAL LOW (ref 3.5–5.1)
Sodium: 139 mmol/L (ref 135–145)

## 2019-10-25 LAB — SEDIMENTATION RATE: Sed Rate: 140 mm/hr — ABNORMAL HIGH (ref 0–16)

## 2019-10-25 LAB — MAGNESIUM: Magnesium: 1.7 mg/dL (ref 1.7–2.4)

## 2019-10-25 LAB — C-REACTIVE PROTEIN: CRP: 7.6 mg/dL — ABNORMAL HIGH (ref <1.0)

## 2019-10-25 MED ORDER — POLYVINYL ALCOHOL 1.4 % OP SOLN
1.0000 [drp] | OPHTHALMIC | Status: DC | PRN
  Administered 2019-10-25: 1 [drp] via OPHTHALMIC
  Filled 2019-10-25: qty 15

## 2019-10-25 MED ORDER — POLYETHYLENE GLYCOL 3350 17 G PO PACK
17.0000 g | PACK | Freq: Every day | ORAL | Status: DC
  Administered 2019-10-26: 17 g via ORAL
  Filled 2019-10-25: qty 1

## 2019-10-25 MED ORDER — VANCOMYCIN HCL 1500 MG/300ML IV SOLN
1500.0000 mg | Freq: Once | INTRAVENOUS | Status: AC
  Administered 2019-10-25: 1500 mg via INTRAVENOUS
  Filled 2019-10-25: qty 300

## 2019-10-25 MED ORDER — LIDOCAINE-EPINEPHRINE 1 %-1:100000 IJ SOLN
INTRAMUSCULAR | Status: AC
  Filled 2019-10-25: qty 1

## 2019-10-25 MED ORDER — SENNOSIDES-DOCUSATE SODIUM 8.6-50 MG PO TABS
1.0000 | ORAL_TABLET | Freq: Two times a day (BID) | ORAL | Status: DC
  Administered 2019-10-25: 1 via ORAL
  Filled 2019-10-25 (×2): qty 1

## 2019-10-25 MED ORDER — MAGNESIUM SULFATE 2 GM/50ML IV SOLN
2.0000 g | Freq: Once | INTRAVENOUS | Status: AC
  Administered 2019-10-25: 2 g via INTRAVENOUS
  Filled 2019-10-25: qty 50

## 2019-10-25 MED ORDER — POTASSIUM CHLORIDE CRYS ER 20 MEQ PO TBCR
40.0000 meq | EXTENDED_RELEASE_TABLET | Freq: Two times a day (BID) | ORAL | Status: AC
  Administered 2019-10-25 – 2019-10-26 (×2): 40 meq via ORAL
  Filled 2019-10-25 (×2): qty 2

## 2019-10-25 MED ORDER — VANCOMYCIN HCL 750 MG/150ML IV SOLN: 750.0000 mg | INTRAVENOUS | Status: DC

## 2019-10-25 NOTE — Progress Notes (Signed)
PROGRESS NOTE    Eric Morton  BZJ:[id number] DOB: [birth year] DOA: 10/20/2019 PCP: [name], NP    No chief complaint on file.   Brief Narrative:  54 year old gentleman prior history of stage IIIb CKD, hypertension, hyperlipidemia, type 2 diabetes mellitus, insulin-dependent diabetes, multiple previous strokes with significant debility presents to ED for progressive weakness. 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  He was started on IV daptomycin by infectious disease.  Possible osteomyelitis of the right foot.  Orthopedics consulted, who recommended no surgical intervention at this time.  Recommend outpatient follow-up with Dr. [name] for diabetic ulcer.  Today patient underwent supratemporal subconjunctival injection of vancomycin and ceftazidime by ophthalmology. He is scheduled for Hickman's line for prolonged IV antibiotics for discharge to SNF.  As per ID patient will need continuous IV daptomycin and IV cefepime for a total of 6 weeks of treatment.  He will be scheduled for outpatient follow-up with Dr. Juleen China. Meanwhile TOC reports that SNF will not be able to afford the medication on discharge.  Plan to change IV antibiotics for discharge.  Patient seen and examined, denies any new complaints.   Assessment & Plan:   Principal Problem:   Acute renal failure superimposed on stage 3a chronic kidney disease (HCC) Active Problems:   Essential hypertension   Diabetic infection of right foot ([place])   History of CVA (cerebrovascular accident)   Hyperlipidemia associated with type 2 diabetes mellitus ([place])   Generalized weakness   Acute urinary retention   GERD without esophagitis   Bacterial conjunctivitis of right eye   Osteomyelitis (HCC)   Endophthalmitis of the right eye Improving , ophthalmology on board and recommended fortified vancomycin, tobramycin and  natamycin eyedrops. He underwent supratemporal subconjunctival injection of vancomycin and ceftazidime by ophthalmology. Recommend outpatient follow-up with ophthalmology on discharge. Patient denies any new complaints.   Right sided diabetic foot ulcer with possible osteomyelitis of the first metatarsal head: -Wound care consulted for dressing changes. -Patient on broad-spectrum IV antibiotics and ID on board for duration of antibiotics. -Pain control and physical therapy. - orthopedics consulted, recommended local wound care with dressing changes, and plan for first ray amputation if the wound worsens, and outpatient follow up with Dr [name].  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 Patient on aspirin and Plavix at home was initially held for possible procedure but has been resumed .  No neuro deficits at this time   Acute on stage IIIa CKD Baseline creatinine between 1.6-2.2, admitted with a creatinine of 4.2 with improvement to 3.8 to 2.87 to 2.6 1.9 with fluids.  Foley catheter placed for mild bilateral hydronephrosis evident on CT. Nephrology consulted and appreciate recommendations,.. Plan for voiding trial tomorrow and possible discharge to SNF without Foley.   Essential hypertension Blood pressure parameters are optimal  Hyperlipidemia Continue with statin   GERD Stable, on PPI.    Diabetes mellitus, insulin-dependent CBG (last 3)  Recent Labs    10/24/19 2114 10/25/19 0716 10/25/19 1112  GLUCAP 196* 98 204*   Resume sliding scale insulin and Lantus 15 units daily.  No changes  in medications.   Continue with gabapentin for diabetic neuropathy.   Acute urinary retention Foley catheter  placed probably secondary to BPH.  Voiding trial tomorrow Continue with Flomax.   Anemia of acute illness superimposed on Anemia of chronic disease Baseline hemoglobin is 10 , stabilized between 8-9 Transfuse to keep hemoglobin greater than 7.  Anemia panel reviewed adequate iron levels and vitamin B12 and folate levels.   Hypokalemia and hypomagnesemia Replaced   DVT prophylaxis: (Lovenox) Code Status: (Full code ) Family Communication: (none at bedside) Disposition:   Status is: Inpatient  Remains inpatient appropriate because:IV treatments appropriate due to intensity of illness or inability to take PO and Inpatient level of care appropriate due to severity of illness   Dispo: The patient is from: ALF              Anticipated d/c is to: SNF              Anticipated d/c date is: 1 day              Patient currently is not medically stable to d/c.       Consultants:   Ophthalmology  ID  Nephrology  Orthopedics.    Procedures:   None.   Antimicrobials:  Antibiotics Given (last 72 hours)    Date/Time Action Medication Dose Rate   10/22/19 2008 New Bag/Given   DAPTOmycin (CUBICIN) 670 mg in sodium chloride 0.9 % IVPB 670 mg 226.8 mL/hr   10/24/19 0920 New Bag/Given   ceFEPIme (MAXIPIME) 2 g in sodium chloride 0.9 % 100 mL IVPB 2 g 200 mL/hr   10/24/19 2114 New Bag/Given   DAPTOmycin (CUBICIN) 700 mg in sodium chloride 0.9 % IVPB 700 mg 228 mL/hr   10/24/19 2242 New Bag/Given   ceFEPIme (MAXIPIME) 2 g in sodium chloride 0.9 % 100 mL IVPB 2 g 200 mL/hr   10/25/19 1004 New Bag/Given   ceFEPIme (MAXIPIME) 2 g in sodium chloride 0.9 % 100 mL IVPB 2 g 200 mL/hr   10/25/19 1518 New Bag/Given   vancomycin (VANCOREADY) IVPB 1500 mg/300 mL 1,500 mg 150 mL/hr         Subjective: No S pain, shortness of breath, nausea vomiting or abdominal pain Objective: Vitals:   10/24/19 1137 10/24/19 2116 10/25/19 0447 10/25/19 1352  BP: 129/77 (!) 119/92 107/71 117/86    Pulse: 76 75 68 79  Resp: 16 18 18 18   Temp: 98 F (36.7 C) 97.9 F (36.6 C) (!) 97.5 F (36.4 C)   TempSrc: Oral Oral Oral   SpO2: 98% 97% 96% 98%  Weight:      Height:        Intake/Output Summary (Last 24 hours) at 10/25/2019 1654 Last data filed at 10/25/2019 1400 Gross per 24 hour  Intake 3439.23 ml  Output 3400 ml  Net 39.23 ml   Filed Weights   10/20/19 2354  Weight: 83.9 kg    Examination:  General exam: Alert and comfortable, not in any kind of distress Respiratory system: Clear to auscultation bilaterally, no wheezing or rhonchi  cardiovascular system: S1-S2 heard, regular rate rhythm, no JVD Gastrointestinal system: Abdomen is soft, nontender, nondistended, bowel sounds normal Central nervous system : Alert and oriented, residual dysarthria present from old strokes Extremities: Right-sided soft tissue ulcer at the first metatarsal area, with foul-smelling drainage. Skin: No rashes seen Psychiatry: Mood is appropriate    Data Reviewed: I have personally reviewed following labs and imaging studies  CBC: Recent  Labs  Lab 10/21/19 0039 10/21/19 0046 10/22/19 0521 10/23/19 0919 10/24/19 0829  WBC 8.5  --  6.4 6.6 5.7  NEUTROABS 6.4  --  4.0  --   --   HGB 9.2* 9.2* 8.6* 8.6* 9.0*  HCT 29.4* 27.0* 26.8* 27.2* 29.3*  MCV 84.0  --  84.3 85.5 87.5  PLT 280  --  249 279 185    Basic Metabolic Panel: Recent Labs  Lab 10/21/19 0724 10/22/19 0521 10/23/19 0919 10/24/19 0829 10/25/19 1005  NA 137 139 141 142 139  K 3.9 3.5 2.9* 3.5 3.2*  CL 107 104 99 98 98  CO2 17* 23 31 30 30   GLUCOSE 164* 153* 116* 101* 144*  BUN 44* 34* 27* 23* 19  CREATININE 3.87* 3.26* 2.87* 2.62* 1.99*  CALCIUM 7.6* 8.3* 8.0* 8.4* 8.1*  MG  --  1.6* 1.6* 1.5* 1.7  PHOS  --   --  3.6  --   --     GFR: Estimated Creatinine Clearance: 43.7 mL/min (A) (by C-G formula based on SCr of 1.99 mg/dL (H)).  Liver Function Tests: Recent Labs  Lab 10/21/19 0039 10/21/19 0724  10/22/19 0521  AST 38 24 18  ALT 26 21 19   ALKPHOS 135* 118 104  BILITOT 0.9 0.7 0.5  PROT 7.6 6.3* 6.2*  ALBUMIN 3.1* 2.6* 2.5*    CBG: Recent Labs  Lab 10/24/19 1134 10/24/19 1657 10/24/19 2114 10/25/19 0716 10/25/19 1112  GLUCAP 219* 241* 196* 98 204*     Recent Results (from the past 240 hour(s))  Urine culture     Status: Abnormal   Collection Time: 10/21/19  1:55 AM   Specimen: In/Out Cath Urine  Result Value Ref Range Status   Specimen Description   Final    IN/OUT CATH URINE Performed at [facility], [place] [address], [place], [place] [address]    Special Requests   Final    NONE Performed at [facility], [place] [address] [place], [place] [address]    Culture 4,000 COLONIES/mL ESCHERICHIA COLI (A)  Final   Report Status 10/23/2019 FINAL  Final   Organism ID, Bacteria ESCHERICHIA COLI (A)  Final      Susceptibility   Escherichia coli - MIC*    AMPICILLIN <=2 SENSITIVE Sensitive     CEFAZOLIN <=4 SENSITIVE Sensitive     CEFTRIAXONE <=0.25 SENSITIVE Sensitive     CIPROFLOXACIN <=0.25 SENSITIVE Sensitive     GENTAMICIN <=1 SENSITIVE Sensitive     IMIPENEM <=0.25 SENSITIVE Sensitive     NITROFURANTOIN <=16 SENSITIVE Sensitive     TRIMETH/SULFA <=20 SENSITIVE Sensitive     AMPICILLIN/SULBACTAM <=2 SENSITIVE Sensitive     PIP/TAZO <=4 SENSITIVE Sensitive     * 4,000 COLONIES/mL ESCHERICHIA COLI  Respiratory Panel by RT PCR (Flu A&B, Covid) - Nasopharyngeal Swab     Status: None   Collection Time: 10/21/19  2:53 AM   Specimen: Nasopharyngeal Swab  Result Value Ref Range Status   SARS Coronavirus 2 by RT PCR NEGATIVE NEGATIVE Final    Comment: (NOTE) SARS-CoV-2 target nucleic acids are NOT DETECTED.  The SARS-CoV-2 RNA is generally detectable in upper respiratoy specimens during the acute phase of infection. The lowest concentration of SARS-CoV-2 viral copies this assay can detect is 131 copies/mL. A negative result  does not preclude SARS-Cov-2 infection and should not be used as the sole basis for treatment or other patient management decisions. A negative result may occur with  improper specimen collection/handling, submission of specimen other than nasopharyngeal swab, presence of viral mutation(s) within the areas targeted by this assay, and inadequate number of viral copies (<131 copies/mL). A negative result must be combined with clinical observations, patient history, and epidemiological information. The expected result is Negative.  Fact Sheet for Patients:  PinkCheek.be  Fact Sheet for Healthcare Providers:  GravelBags.it  This test is no t yet approved or cleared by the [place] FDA and  has been authorized for detection and/or diagnosis of SARS-CoV-2 by FDA under an Emergency Use Authorization (EUA). This EUA will remain  in effect (meaning this test can be used) for the duration of the COVID-19 declaration under Section 564(b)(1) of the Act, 21 U.S.C. section 360bbb-3(b)(1), unless the authorization is terminated or revoked sooner.     Influenza A by PCR NEGATIVE NEGATIVE Final   Influenza B by PCR NEGATIVE NEGATIVE Final    Comment: (NOTE) The Xpert Xpress SARS-CoV-2/FLU/RSV assay is intended as an aid in  the diagnosis of influenza from Nasopharyngeal swab specimens and  should not be used as a sole basis for treatment. Nasal washings and  aspirates are unacceptable for Xpert Xpress SARS-CoV-2/FLU/RSV  testing.  Fact Sheet for Patients: PinkCheek.be  Fact Sheet for Healthcare Providers: GravelBags.it  This test is not yet approved or cleared by the [place] FDA and  has been authorized for detection and/or diagnosis of SARS-CoV-2 by  FDA under an Emergency Use Authorization (EUA). This EUA will remain  in effect (meaning this test can be used) for  the duration of the  Covid-19 declaration under Section 564(b)(1) of the Act, 21  U.S.C. section 360bbb-3(b)(1), unless the authorization is  terminated or revoked. Performed at [facility], [place] [address] [place], [place] [address]   Culture, blood (routine x 2)     Status: None (Preliminary result)   Collection Time: 10/21/19  7:24 AM   Specimen: BLOOD LEFT HAND  Result Value Ref Range Status   Specimen Description   Final    BLOOD LEFT HAND Performed at [place] [address], [place], [place] [address]    Special Requests   Final    BOTTLES DRAWN AEROBIC ONLY Blood Culture adequate volume Performed at [place] [address] [place], [place] [address]    Culture   Final    NO GROWTH 4 DAYS Performed at [facility], [place] [address], [place], [place] [address]    Report Status PENDING  Incomplete  Culture, blood (routine x 2)     Status: None (Preliminary result)   Collection Time: 10/21/19  7:24 AM   Specimen: BLOOD  Result Value Ref Range Status   Specimen Description   Final    BLOOD LEFT THUMB Performed at [place] [address] [place], [place] [address]    Special Requests   Final    BOTTLES DRAWN AEROBIC ONLY Blood Culture adequate volume Performed at [place] [address] [place], [place] [address]    Culture   Final    NO GROWTH 4 DAYS Performed at [facility], [place] [address], [place], [place] [address]    Report Status PENDING  Incomplete  C Difficile Quick Screen w PCR reflex     Status: None   Collection Time: 10/21/19 10:42 AM   Specimen: STOOL  Result Value Ref Range Status   C Diff antigen NEGATIVE NEGATIVE Final  C Diff toxin NEGATIVE NEGATIVE Final   C Diff interpretation No C. difficile detected.  Final    Comment: Performed at [facility], [place] [address] [place], [place] [address]          Radiology Studies: No results found.      Scheduled Meds: . aspirin  81 mg Oral Daily  . atropine  1 drop Right Eye BID  . Chlorhexidine Gluconate Cloth  6 each Topical Daily  . clopidogrel  75 mg Oral Daily  . enoxaparin (LOVENOX) injection  40 mg Subcutaneous Q24H  . gabapentin  300 mg Oral BID  . insulin aspart  0-15 Units Subcutaneous TID AC & HS  . insulin glargine  15 Units Subcutaneous Daily  . lidocaine-EPINEPHrine      . melatonin  10 mg Oral QHS  . natamycin  1 drop Right Eye Q2H while awake  . pantoprazole  40 mg Oral QPM  . prednisoLONE acetate  1 drop Right Eye QID  . rosuvastatin  10 mg Oral QHS  . senna-docusate  1 tablet Oral Daily  . tamsulosin  0.4 mg Oral QPC breakfast  . tobramycin 15mg /mL (fortified) compounded eye drops  1 drop Right Eye Q2H  . traZODone  100 mg Oral QHS  . trimethoprim-polymyxin b  2 drop Right Eye Q6H  . Vancomycin 25mg /mL (fortified) compounded eye drops  1 drop Right Eye Q2H   Continuous Infusions: . ceFEPime (MAXIPIME) IV 2 g (10/25/19 1004)  .  sodium bicarbonate (isotonic) infusion in sterile water 100 mL/hr at 10/25/19 1400  . vancomycin 1,500 mg (10/25/19 1518)  . [START ON 10/26/2019] vancomycin       LOS: 3 days        Hosie Poisson, MD [facility]   To contact the attending provider between 7A-7P or the covering provider during after hours 7P-7A, please log into the web site [email or web address] and access using universal [place] password for that web site. If you do not have the password, please call the hospital operator.  10/25/2019, 4:54 PM

## 2019-10-25 NOTE — Progress Notes (Signed)
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 The nidus for the infection appears to have been a suture abscess in the cornea for which patient has a Corneal ulcer.        Past Medical History:  Diagnosis Date  . 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 Anxiety   . Chronic kidney disease    "jsut a little damage"  . Glaucoma   . History of stomach ulcers    not confirmed, never underwent diagnostic endo or radiology, this manifested as epigastric distress.   [name] Hx of adenomatous colonic polyps   . Hypercholesterolemia   . Hypertension   . Peripheral neuropathy   . Polycythemia vera ([place]) 2006  . Renal insufficiency 07/16/2017  . Stroke [facility])    slurred speech  . Substance abuse ([place])   . Type II diabetes mellitus (HCC)     family history includes Diabetes in his mother; Hypertension in his mother.  Social History        Occupational History  . Not on file  Tobacco Use  . Smoking status: Former Smoker    Packs/day: 0.25    Years: 32.00    Pack years: 8.00    Types: Cigarettes  . Smokeless tobacco: Never Used  Vaping Use  . Vaping Use: Never used  Substance and Sexual Activity  . Alcohol use: Yes    Comment: 3/14 quit drinking 5 days ago and then drank last nite  . Drug use: Yes    Types: Marijuana, "Crack" cocaine    Comment: snorts and smokes cocaine ocassionally   . Sexual activity: Yes         Allergies  Allergen Reactions  . Lipitor [Atorvastatin] Other (See Comments)    Sister states had to have Cardiac Surgery    ROS: Positive as above, otherwise negative.  EXAM:  Mental Status: A&O x 3  Base Exam: Right Eye   Visual Acuity (At near)  NLP   IOP ([place])    Pupillary Exam APD   Motility Full    Confrontation VF N/A    Anterior Segment Exam    Lids/Lashes WNL   Conjuctiva 1+ injection   Cornea approx 1.5 mm infiltrate without epi defect   Anterior Chamber Deep, approx  1 mm hypopyon inf.   Iris Mid dilated   Lens Posterior plaque   Vitreous     Poster Segment Exam    Disc No view, known dense vitreitis from prior b-scan   CD ratio    Macula    Vessels    Periphery     Radiographic Studies Reviewed:  Imaging Results (Last 48 hours)  DG Foot 2 Views Right  Result Date: 10/21/2019 CLINICAL DATA: Right foot wound EXAM: RIGHT FOOT - 2 VIEW COMPARISON: None. FINDINGS: Two view radiograph right foot demonstrates a soft tissue defect medial to the first metatarsal head with mild associated soft tissue swelling but no retained radiopaque foreign body or subjacent osseous erosion. Mild first ray bunion deformity. Mild degenerative arthritis at the first metatarsophalangeal joint. No acute fracture or dislocation. Remaining joint spaces are preserved. Vascular calcifications are seen within the soft tissues. Mild right forefoot soft tissue swelling. No ankle effusion. IMPRESSION: Soft tissue ulceration and associated soft tissue swelling. No associated  osseous erosion or retained radiopaque foreign body. Electronically Signed By: Fidela Salisbury MD On: 10/21/2019 00:44   CT Renal Stone Study  Result Date: 10/21/2019 CLINICAL DATA: Acute renal failure, hypertension EXAM: CT ABDOMEN AND PELVIS WITHOUT CONTRAST TECHNIQUE: Multidetector CT imaging of the abdomen and pelvis was performed following the standard protocol without IV contrast. COMPARISON: None. FINDINGS: Lower chest: The visualized lung bases are clear. Cardiac size within normal limits. No pericardial effusion. Hepatobiliary: No focal liver abnormality is seen. No gallstones, gallbladder wall thickening, or biliary dilatation.  Pancreas: Unremarkable Spleen: Unremarkable Adrenals/Urinary Tract: There is mild an in bilateral hydronephrosis and mild distention of the bladder which, together, may reflect changes of voluntary urinary retention or bladder outlet obstruction. The kidneys are normal in size and position. No intrarenal or ureteral calculi are identified. Mild bilateral nonspecific perinephric stranding is seen. The adrenal glands are unremarkable. Stomach/Bowel: The stomach, small bowel, and large bowel are unremarkable. Appendix normal. No free intraperitoneal gas or fluid. Vascular/Lymphatic: Mild aortoiliac atherosclerotic calcification without evidence of aneurysm. No pathologic adenopathy within the abdomen and pelvis. Reproductive: The prostate gland is normal in size. Seminal vesicles are unremarkable. Extensive calcification of the vas deferens is seen, a finding that can be seen the setting of longstanding diabetes mellitus. Other: Rectum unremarkable. Musculoskeletal: No acute bone abnormality. IMPRESSION: Mild bilateral hydronephrosis and moderate distention of the bladder suggesting changes of either voluntary retention or bladder outlet obstruction. Aortic Atherosclerosis (ICD10-I70.0). Electronically Signed By: Fidela Salisbury MD On: 10/21/2019 04:54     Assessment and Recommendation: 1. Endophthalmitis OD:  -Stable appearance today - s/p moxifloxacin injection into the anterior chamber (in clinic), Cultures taken but no growth to date. Rec- ABX per ID - Needs to continue fortified topical antibiotics Q2H - Fortified Vacomycin -25 mg/ml  - Fortified Tobramycin - 15 mg/ml -Natamycin ophthalmic - Q 2H #At discharge please give patient all bottles.             - Given continued hypopyon, Vitreous injection performed today :see procedure note -If blood  cultures lead to concern for bacteremia or sepsis, pt needs transfer to [facility] for enucleation/eviceration.    Please call with any questions.  Jola Schmidt MD [facility] Ophthalmology [phone]

## 2019-10-25 NOTE — TOC Progression Note (Signed)
Transition of Care [facility]) - Progression Note    Patient Details  Name: Eric Morton MRN: [id number] Date of Birth: [birth year]  Transition of Care [facility]) CM/SW Contact  Kylieann Eagles, Juliann Pulse, RN Phone Number: 10/25/2019, 11:46 AM  Clinical Narrative: Joetta Manners [facility]-rep [place]-still awaiting auth for SNF;iv abx long term for-cefepime  2gm q12-end 11/5, & vancomycin 750mg  q24 end 11/5.      Expected Discharge Plan: Skilled Nursing Facility Barriers to Discharge: Insurance Authorization  Expected Discharge Plan and Services Expected Discharge Plan: [place]   Discharge Planning Services: CM Consult   Living arrangements for the past 2 months: Assisted Living Facility                                       Social Determinants of Health (SDOH) Interventions    Readmission Risk Interventions No flowsheet data found.

## 2019-10-25 NOTE — Progress Notes (Signed)
[facility] for Infectious Disease   Date of Admission:  10/20/2019   Total days of antibiotics: 5         Current antibiotics:         Day 5 cefepime        Day 4 daptomycin  Reason for visit: Follow up on DFU, endophthalmitis  Interval events: Afebrile No events Cultures negative CRP and ESR elevated Cr Cl ~30  Assessment: Eric Morton is a 54 y.o. male.  He has a hx of DM, CKD stage 3, CVA with debility, HTN admitted 10/21/19 with progressive weakness in the setting of a right foot ulcer for approx 6 weeks and worsening pain for 2 weeks.  Found to have AKI on admission.  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   Orthopedics has seen and recommends continued local wound care.  Patient not currently interested in ray amputation.  Plan is to follow up with Dr [name] at discharge. CT shows findings concerning for osteomyelitis.  Recommendations: -- continue with daptomycin and cefepime dosed for renal function -- CK weekly (baseline = 128; 10/22/19) -- endophthalmitis management per ophthalmology -- currently no surgical management per ortho -- awaiting [facility] per IR -- wound care -- glycemic control -- monitor renal function -- will work on OPAT orders and abx as able -- will arrange outpatient follow up (10/22 with me) -- plan for 6 weeks total for OM treatment.    Principal Problem:   Diabetic infection of right foot ([place]) Active Problems:   Essential hypertension   History of CVA (cerebrovascular accident)   Hyperlipidemia associated with type 2 diabetes mellitus ([place])   Generalized weakness   AKI (acute kidney injury) ([place])   Acute urinary retention   GERD without esophagitis   Bacterial conjunctivitis of right eye   Osteomyelitis (HCC)    Scheduled Meds: . aspirin  81 mg Oral Daily  . atropine  1 drop Right Eye BID  . Chlorhexidine Gluconate Cloth  6 each Topical Daily  . clopidogrel  75 mg  Oral Daily  . enoxaparin (LOVENOX) injection  40 mg Subcutaneous Q24H  . gabapentin  300 mg Oral BID  . insulin aspart  0-15 Units Subcutaneous TID AC & HS  . insulin glargine  15 Units Subcutaneous Daily  . melatonin  10 mg Oral QHS  . natamycin  1 drop Right Eye Q2H while awake  . pantoprazole  40 mg Oral QPM  . prednisoLONE acetate  1 drop Right Eye QID  . rosuvastatin  10 mg Oral QHS  . senna-docusate  1 tablet Oral Daily  . tamsulosin  0.4 mg Oral QPC breakfast  . tobramycin 74m/mL (fortified) compounded eye drops  1 drop Right Eye Q2H  . traZODone  100 mg Oral QHS  . trimethoprim-polymyxin b  2 drop Right Eye Q6H  . Vancomycin 237mmL (fortified) compounded eye drops  1 drop Right Eye Q2H   Continuous Infusions: . ceFEPime (MAXIPIME) IV 2 g (10/24/19 2242)  . DAPTOmycin (CUBICIN)  IV 700 mg (10/24/19 2114)  .  sodium bicarbonate (isotonic) infusion in sterile water 100 mL/hr at 10/25/19 0819   PRN Meds:.acetaminophen **OR** acetaminophen, ondansetron **OR** ondansetron (ZOFRAN) IV, polyethylene glycol  Subjective: Pt has no new complaints today. Tolerating abx  Allergies  Allergen Reactions  . Lipitor [Atorvastatin] Other (See Comments)    Sister states had to have Cardiac Surgery  Review of Systems: Review of Systems  Constitutional: Negative for chills and fever.  Eyes:       Stable vision.  Respiratory: Negative.   Cardiovascular: Negative.   Musculoskeletal:       Pain is controlled.   Skin: Negative for rash.  All other systems reviewed and are negative.    Objective: Blood pressure 107/71, pulse 68, temperature (!) 97.5 F (36.4 C), temperature source Oral, resp. rate 18, height $RemoveBe'5\' 8"'aFzBCMnvm$  (1.727 m), weight 83.9 kg, SpO2 96 %. Body mass index is 28.13 kg/m.  Physical Exam Constitutional:      Comments: Chronically ill appearing sitting up in bed.   HENT:     Head: Normocephalic and atraumatic.     Right Ear: External ear normal.     Left Ear:  External ear normal.     Nose: Nose normal.  Eyes:     Comments: Left eye appears normal. Right eye is improved today.  Pulmonary:     Effort: Pulmonary effort is normal. No respiratory distress.  Skin:    General: Skin is warm and dry.  Neurological:     Mental Status: He is alert.       Lab Results: Lab Results  Component Value Date   WBC 5.7 10/24/2019   HGB 9.0 (L) 10/24/2019   HCT 29.3 (L) 10/24/2019   MCV 87.5 10/24/2019   PLT 283 10/24/2019    Lab Results  Component Value Date   CREATININE 2.62 (H) 10/24/2019   BUN 23 (H) 10/24/2019   NA 142 10/24/2019   K 3.5 10/24/2019   CL 98 10/24/2019   CO2 30 10/24/2019    Lab Results  Component Value Date   ALT 19 10/22/2019   AST 18 10/22/2019   ALKPHOS 104 10/22/2019     Microbiology: Recent Results (from the past 240 hour(s))  Urine culture     Status: Abnormal   Collection Time: 10/21/19  1:55 AM   Specimen: In/Out Cath Urine  Result Value Ref Range Status   Specimen Description   Final    IN/OUT CATH URINE Performed at [facility], [place] [address] [place], [place] [address]    Special Requests   Final    NONE Performed at [facility], [place] [address], [place], [place] [address]    Culture 4,000 COLONIES/mL ESCHERICHIA COLI (A)  Final   Report Status 10/23/2019 FINAL  Final   Organism ID, Bacteria ESCHERICHIA COLI (A)  Final      Susceptibility   Escherichia coli - MIC*    AMPICILLIN <=2 SENSITIVE Sensitive     CEFAZOLIN <=4 SENSITIVE Sensitive     CEFTRIAXONE <=0.25 SENSITIVE Sensitive     CIPROFLOXACIN <=0.25 SENSITIVE Sensitive     GENTAMICIN <=1 SENSITIVE Sensitive     IMIPENEM <=0.25 SENSITIVE Sensitive     NITROFURANTOIN <=16 SENSITIVE Sensitive     TRIMETH/SULFA <=20 SENSITIVE Sensitive     AMPICILLIN/SULBACTAM <=2 SENSITIVE Sensitive     PIP/TAZO <=4 SENSITIVE Sensitive     * 4,000 COLONIES/mL ESCHERICHIA COLI  Respiratory Panel by RT PCR (Flu A&B,  Covid) - Nasopharyngeal Swab     Status: None   Collection Time: 10/21/19  2:53 AM   Specimen: Nasopharyngeal Swab  Result Value Ref Range Status   SARS Coronavirus 2 by RT PCR NEGATIVE NEGATIVE Final    Comment: (NOTE) SARS-CoV-2 target nucleic acids are NOT DETECTED.  The SARS-CoV-2 RNA is generally detectable in upper respiratoy specimens during  the acute phase of infection. The lowest concentration of SARS-CoV-2 viral copies this assay can detect is 131 copies/mL. A negative result does not preclude SARS-Cov-2 infection and should not be used as the sole basis for treatment or other patient management decisions. A negative result may occur with  improper specimen collection/handling, submission of specimen other than nasopharyngeal swab, presence of viral mutation(s) within the areas targeted by this assay, and inadequate number of viral copies (<131 copies/mL). A negative result must be combined with clinical observations, patient history, and epidemiological information. The expected result is Negative.  Fact Sheet for Patients:  PinkCheek.be  Fact Sheet for Healthcare Providers:  GravelBags.it  This test is no t yet approved or cleared by the [place] FDA and  has been authorized for detection and/or diagnosis of SARS-CoV-2 by FDA under an Emergency Use Authorization (EUA). This EUA will remain  in effect (meaning this test can be used) for the duration of the COVID-19 declaration under Section 564(b)(1) of the Act, 21 U.S.C. section 360bbb-3(b)(1), unless the authorization is terminated or revoked sooner.     Influenza A by PCR NEGATIVE NEGATIVE Final   Influenza B by PCR NEGATIVE NEGATIVE Final    Comment: (NOTE) The Xpert Xpress SARS-CoV-2/FLU/RSV assay is intended as an aid in  the diagnosis of influenza from Nasopharyngeal swab specimens and  should not be used as a sole basis for treatment. Nasal  washings and  aspirates are unacceptable for Xpert Xpress SARS-CoV-2/FLU/RSV  testing.  Fact Sheet for Patients: PinkCheek.be  Fact Sheet for Healthcare Providers: GravelBags.it  This test is not yet approved or cleared by the [place] FDA and  has been authorized for detection and/or diagnosis of SARS-CoV-2 by  FDA under an Emergency Use Authorization (EUA). This EUA will remain  in effect (meaning this test can be used) for the duration of the  Covid-19 declaration under Section 564(b)(1) of the Act, 21  U.S.C. section 360bbb-3(b)(1), unless the authorization is  terminated or revoked. Performed at [facility], [place] [address], [place], [place] [address]   Culture, blood (routine x 2)     Status: None (Preliminary result)   Collection Time: 10/21/19  7:24 AM   Specimen: BLOOD LEFT HAND  Result Value Ref Range Status   Specimen Description   Final    BLOOD LEFT HAND Performed at [place] [address] [place], [place] [address]    Special Requests   Final    BOTTLES DRAWN AEROBIC ONLY Blood Culture adequate volume Performed at [place] [address] [place], [place] [address]    Culture   Final    NO GROWTH 4 DAYS Performed at [facility], [place] [address], [place], [place] [address]    Report Status PENDING  Incomplete  Culture, blood (routine x 2)     Status: None (Preliminary result)   Collection Time: 10/21/19  7:24 AM   Specimen: BLOOD  Result Value Ref Range Status   Specimen Description   Final    BLOOD LEFT THUMB Performed at [place] [address] [place], [place] [address]    Special Requests   Final    BOTTLES DRAWN AEROBIC ONLY Blood Culture adequate volume Performed at [place] [place] [address] [place], [place] [address]    Culture   Final    NO GROWTH 4 DAYS Performed  at [facility], [place],  [place] [address]    Report Status PENDING  Incomplete  C Difficile Quick Screen w PCR reflex     Status: None   Collection Time: 10/21/19 10:42 AM   Specimen: STOOL  Result Value Ref Range Status   C Diff antigen NEGATIVE NEGATIVE Final   C Diff toxin NEGATIVE NEGATIVE Final   C Diff interpretation No C. difficile detected.  Final    Comment: Performed at [facility], [place] [address] [place],  [address]     Raynelle Highland for Infectious Disease [facility] [phone] pager 10/25/2019, 8:34 AM

## 2019-10-25 NOTE — Procedures (Signed)
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 Despite this patient has continued hypopyon. As such intravitreal injection of Vacomycin (1 mg/0.1 mL) and Ceftazidime 2.5 mg/0.1 mL) was recommended.  EBL: minmal Specimen: none Anesthesia: topical, subconjuctival 2% lidocaine with epinephrine.   After discussion of the risks benefits and alternatives. The patient was prepped and 0.3 ml of lidocaine was injected into the supratemporal subconjctival space. Patient has a know funnel RD visible on bscan inferiorly so a typical superior approach was chosen. At that point, a speculum was placed and the conjuctiva marked with a caliper at 4 mm posterior to the limbus. ). 0.1 ml of each medication was then injected into the vitreous without complication. The eye was irrigated copiously post op.  SS that warrant concern were discussed with the patient.

## 2019-10-25 NOTE — Progress Notes (Addendum)
Pharmacy Antibiotic Note  Eric Morton is a 54 y.o. male admitted on 10/20/2019 with osteomyelitis of the right foot secondary to a diabetic foot ulcer.   Pharmacy has been consulted for vancomycin dosing. We had been avoiding Vancomycin due to AKI. His SNF will not accept him on Daptomycin or Ceftaroline unfortunately and he is unlikely to do well on long-term linezolid. Will aim to dose Vancomycin conservatively with a goal trough more in the 10-15 range- Discussed with Dr. Juleen China. SCr improved to 2.62 (CrCl ~ 53ml/min).   Plan: Vancomycin 1500 mg X 1 Then 750 mg every 24 hours Predicted trough of 12 on this regimen with an AUC of 418 Monitor renal function closely  Height: 5\' 8"  (172.7 cm) Weight: 83.9 kg (185 lb) IBW/kg (Calculated) : 68.4  Temp (24hrs), Avg:97.8 F (36.6 C), Min:97.5 F (36.4 C), Max:98 F (36.7 C)  Recent Labs  Lab 10/21/19 0039 10/21/19 0039 10/21/19 0046 10/21/19 0724 10/22/19 0521 10/23/19 0919 10/24/19 0829  WBC 8.5  --   --   --  6.4 6.6 5.7  CREATININE 4.20*   < > 4.60* 3.87* 3.26* 2.87* 2.62*  LATICACIDVEN 1.1  --   --  0.7  --   --   --    < > = values in this interval not displayed.    Estimated Creatinine Clearance: 33.2 mL/min (A) (by C-G formula based on SCr of 2.62 mg/dL (H)).    Allergies  Allergen Reactions  . Lipitor [Atorvastatin] Other (See Comments)    Sister states had to have Cardiac Surgery     Thank you for allowing pharmacy to be a part of this patient's care.  Jimmy Footman, PharmD, BCPS, BCIDP Infectious Diseases Clinical Pharmacist Phone: [phone] 10/25/2019 9:40 AM

## 2019-10-26 ENCOUNTER — Inpatient Hospital Stay (HOSPITAL_COMMUNITY): Payer: Medicaid Other

## 2019-10-26 DIAGNOSIS — E1169 Type 2 diabetes mellitus with other specified complication: Secondary | ICD-10-CM

## 2019-10-26 DIAGNOSIS — H44001 Unspecified purulent endophthalmitis, right eye: Secondary | ICD-10-CM

## 2019-10-26 DIAGNOSIS — E785 Hyperlipidemia, unspecified: Secondary | ICD-10-CM

## 2019-10-26 DIAGNOSIS — R339 Retention of urine, unspecified: Secondary | ICD-10-CM

## 2019-10-26 DIAGNOSIS — Z8673 Personal history of transient ischemic attack (TIA), and cerebral infarction without residual deficits: Secondary | ICD-10-CM

## 2019-10-26 DIAGNOSIS — H44009 Unspecified purulent endophthalmitis, unspecified eye: Secondary | ICD-10-CM | POA: Diagnosis present

## 2019-10-26 DIAGNOSIS — M8600 Acute hematogenous osteomyelitis, unspecified site: Secondary | ICD-10-CM

## 2019-10-26 DIAGNOSIS — E1142 Type 2 diabetes mellitus with diabetic polyneuropathy: Secondary | ICD-10-CM

## 2019-10-26 LAB — CBC WITH DIFFERENTIAL/PLATELET
Abs Immature Granulocytes: 0.03 10*3/uL (ref 0.00–0.07)
Basophils Absolute: 0 10*3/uL (ref 0.0–0.1)
Basophils Relative: 0 %
Eosinophils Absolute: 0.4 10*3/uL (ref 0.0–0.5)
Eosinophils Relative: 5 %
HCT: 28.6 % — ABNORMAL LOW (ref 39.0–52.0)
Hemoglobin: 8.6 g/dL — ABNORMAL LOW (ref 13.0–17.0)
Immature Granulocytes: 1 %
Lymphocytes Relative: 23 %
Lymphs Abs: 1.5 10*3/uL (ref 0.7–4.0)
MCH: 26.5 pg (ref 26.0–34.0)
MCHC: 30.1 g/dL (ref 30.0–36.0)
MCV: 88.3 fL (ref 80.0–100.0)
Monocytes Absolute: 0.6 10*3/uL (ref 0.1–1.0)
Monocytes Relative: 9 %
Neutro Abs: 4.1 10*3/uL (ref 1.7–7.7)
Neutrophils Relative %: 62 %
Platelets: 317 10*3/uL (ref 150–400)
RBC: 3.24 MIL/uL — ABNORMAL LOW (ref 4.22–5.81)
RDW: 12.9 % (ref 11.5–15.5)
WBC: 6.5 10*3/uL (ref 4.0–10.5)

## 2019-10-26 LAB — GLUCOSE, CAPILLARY
Glucose-Capillary: 91 mg/dL (ref 70–99)
Glucose-Capillary: 177 mg/dL — ABNORMAL HIGH (ref 70–99)
Glucose-Capillary: 188 mg/dL — ABNORMAL HIGH (ref 70–99)

## 2019-10-26 LAB — CULTURE, BLOOD (ROUTINE X 2)
Culture: NO GROWTH
Culture: NO GROWTH
Special Requests: ADEQUATE
Special Requests: ADEQUATE

## 2019-10-26 LAB — BASIC METABOLIC PANEL
Anion gap: 11 (ref 5–15)
BUN: 19 mg/dL (ref 6–20)
CO2: 31 mmol/L (ref 22–32)
Calcium: 8.1 mg/dL — ABNORMAL LOW (ref 8.9–10.3)
Chloride: 101 mmol/L (ref 98–111)
Creatinine, Ser: 1.94 mg/dL — ABNORMAL HIGH (ref 0.61–1.24)
GFR calc Af Amer: 44 mL/min — ABNORMAL LOW (ref >60)
GFR calc non Af Amer: 38 mL/min — ABNORMAL LOW (ref >60)
Glucose, Bld: 79 mg/dL (ref 70–99)
Potassium: 3.5 mmol/L (ref 3.5–5.1)
Sodium: 143 mmol/L (ref 135–145)

## 2019-10-26 LAB — MAGNESIUM: Magnesium: 2.2 mg/dL (ref 1.7–2.4)

## 2019-10-26 LAB — RESPIRATORY PANEL BY RT PCR (FLU A&B, COVID)
Influenza A by PCR: NEGATIVE
Influenza B by PCR: NEGATIVE
SARS Coronavirus 2 by RT PCR: NEGATIVE

## 2019-10-26 MED ORDER — NATAMYCIN 5 % OP SUSP: 20.0000 [drp] | OPHTHALMIC | 0 refills | Status: DC

## 2019-10-26 MED ORDER — HYDROCODONE-ACETAMINOPHEN 5-325 MG PO TABS
1.0000 | ORAL_TABLET | ORAL | 0 refills | Status: DC | PRN
Start: 2019-10-26 — End: 2019-11-30

## 2019-10-26 MED ORDER — NONFORMULARY OR COMPOUNDED ITEM: 1.0000 [drp] | Status: AC

## 2019-10-26 MED ORDER — SENNA-DOCUSATE SODIUM 8.6-50 MG PO TABS: 1.0000 | ORAL_TABLET | Freq: Two times a day (BID) | ORAL | Status: AC

## 2019-10-26 MED ORDER — VANCOMYCIN IV (FOR PTA / DISCHARGE USE ONLY): 1000.0000 mg | INTRAVENOUS | 0 refills | Status: DC

## 2019-10-26 MED ORDER — NATAMYCIN 5 % OP SUSP: OPHTHALMIC | 0 refills | Status: DC

## 2019-10-26 MED ORDER — GABAPENTIN 300 MG PO CAPS: 300.0000 mg | ORAL_CAPSULE | Freq: Two times a day (BID) | ORAL | Status: AC

## 2019-10-26 MED ORDER — CEFEPIME IV (FOR PTA / DISCHARGE USE ONLY): 2.0000 g | Freq: Two times a day (BID) | INTRAVENOUS | 0 refills | Status: DC

## 2019-10-26 MED ORDER — POLYMYXIN B-TRIMETHOPRIM 10000-0.1 UNIT/ML-% OP SOLN: 2.0000 [drp] | Freq: Four times a day (QID) | OPHTHALMIC | 0 refills | Status: AC

## 2019-10-26 MED ORDER — TAMSULOSIN HCL 0.4 MG PO CAPS: 0.4000 mg | ORAL_CAPSULE | Freq: Every day | ORAL | 2 refills | Status: AC

## 2019-10-26 MED ORDER — POLYVINYL ALCOHOL 1.4 % OP SOLN: 1.0000 [drp] | OPHTHALMIC | 0 refills | Status: AC | PRN

## 2019-10-26 MED ORDER — VANCOMYCIN HCL IN DEXTROSE 1-5 GM/200ML-% IV SOLN
1000.0000 mg | INTRAVENOUS | Status: DC
  Administered 2019-10-26: 1000 mg via INTRAVENOUS
  Filled 2019-10-26: qty 200

## 2019-10-26 MED ORDER — POLYETHYLENE GLYCOL 3350 17 G PO PACK: 17.0000 g | PACK | Freq: Every day | ORAL | 0 refills | Status: AC

## 2019-10-26 MED ORDER — HEPARIN SOD (PORK) LOCK FLUSH 100 UNIT/ML IV SOLN
250.0000 [IU] | INTRAVENOUS | Status: DC | PRN
  Filled 2019-10-26: qty 2.5

## 2019-10-26 MED ORDER — SODIUM CHLORIDE 0.9% FLUSH
10.0000 mL | INTRAVENOUS | Status: DC | PRN
  Administered 2019-10-26: 10 mL

## 2019-10-26 NOTE — Progress Notes (Signed)
Pharmacy Antibiotic Note  AJAMU MAXON is a 54 y.o. male admitted on 10/20/2019 with osteomyelitis of the right foot secondary to a diabetic foot ulcer.   Pharmacy has been consulted for vancomycin dosing. We had been avoiding Vancomycin due to AKI. His SNF will not accept him on Daptomycin or Ceftaroline unfortunately and he is unlikely to do well on long-term linezolid. Will aim to dose Vancomycin conservatively with a goal trough more in the 10-15 range- Discussed with Dr. Juleen China. SCr today is improved to 1.94. Patient also with good urine output of 1.6 ml/kg/hr. Will empirically increase dose slightly to stay in therapeutic range.   Plan: Increase Vancomycin to 1000 mg every 24 hours  Predicted trough of 11 on this regimen with an AUC of 429 Monitor renal function closely  Height: 5\' 8"  (172.7 cm) Weight: 83.9 kg (185 lb) IBW/kg (Calculated) : 68.4  No data recorded.  Recent Labs  Lab 10/21/19 0039 10/21/19 0046 10/21/19 0724 10/21/19 0724 10/22/19 0521 10/23/19 0919 10/24/19 0829 10/25/19 1005 10/26/19 0436  WBC 8.5  --   --   --  6.4 6.6 5.7  --   --   CREATININE 4.20*   < > 3.87*   < > 3.26* 2.87* 2.62* 1.99* 1.94*  LATICACIDVEN 1.1  --  0.7  --   --   --   --   --   --    < > = values in this interval not displayed.    Estimated Creatinine Clearance: 44.9 mL/min (A) (by C-G formula based on SCr of 1.94 mg/dL (H)).    Allergies  Allergen Reactions   Lipitor [Atorvastatin] Other (See Comments)    Sister states had to have Cardiac Surgery     Thank you for allowing pharmacy to be a part of this patients care.  Jimmy Footman, PharmD, BCPS, BCIDP Infectious Diseases Clinical Pharmacist Phone: [phone] 10/26/2019 8:00 AM

## 2019-10-26 NOTE — Progress Notes (Addendum)
[facility]   Date of Admission:  10/20/2019   Total days of antibiotics: 6         Current antibiotics:         Day 6 cefepime        Day 2 vancomycin  Reason for visit: Follow up on DFU, endophthalmitis  Interval events: Afebrile No events Creatinine improved Hickman placed yesterday  Assessment: Eric Morton is a 54 y.o. male.  He has a hx of DM, CKD stage 3, CVA with debility, HTN admitted 10/21/19 with progressive weakness in the setting of a right foot ulcer for approx 6 weeks and worsening pain for 2 weeks.  Found to have AKI on admission now improving.  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   Orthopedics has seen and recommends continued local wound care.  Patient not currently interested in ray amputation.  Plan is to follow up with Dr [name] at discharge. CT shows findings concerning for osteomyelitis.  Recommendations: -- continue with vancomycin and cefepime dosed for renal function -- we were unable to obtain daptomycin or ceftaroline through his SNF that he is going to be discharged to as to why vancomycin was chosen -- endophthalmitis management per ophthalmology -- currently no surgical management per ortho -- wound care -- glycemic control -- monitor renal function -- will arrange outpatient follow up (10/22 with me) -- plan for 6 weeks total for OM treatment -- I will sign off for now.  Please call as needed.    Diagnosis: Right foot osteo  Culture Result: None  Allergies  Allergen Reactions  . Lipitor [Atorvastatin] Other (See Comments)    Sister states had to have Cardiac Surgery    OPAT Orders Discharge antibiotics to be given via Hickman line Discharge antibiotics: 1. Vancomycin 1gm q24h 2. Cefepime 2gm q12h  Per pharmacy protocol  Aim for Vancomycin trough 10-15 or AUC 400-550 (unless otherwise indicated) Duration: 6 weeks End Date: 12/02/19  [facility]  Care Per Protocol:  Home health RN for IV administration and teaching; PICC line care and labs.    Labs weekly while on IV antibiotics: _x_ CBC with differential _x_ BMP __ CMP _x_ CRP every other week _x_ ESR every other week _x_ Vancomycin trough 2x per week __ CK  _x_ Pt will need to have Hickman removed at completion of therapy   Fax weekly labs to [phone]  Clinic Follow Up Appt: 11/18/19 at 10:15 am with Dr Juleen China     Principal Problem:   Diabetic infection of right foot [facility]) Active Problems:   Essential hypertension   History of CVA (cerebrovascular accident)   Hyperlipidemia associated with type 2 diabetes mellitus ([place])   Generalized weakness   AKI (acute kidney injury) ([place])   Acute urinary retention   GERD without esophagitis   Bacterial conjunctivitis of right eye   Osteomyelitis (HCC)    Scheduled Meds: . aspirin  81 mg Oral Daily  . atropine  1 drop Right Eye BID  . Chlorhexidine Gluconate Cloth  6 each Topical Daily  . clopidogrel  75 mg Oral Daily  . enoxaparin (LOVENOX) injection  40 mg Subcutaneous Q24H  . gabapentin  300 mg Oral BID  . insulin aspart  0-15 Units Subcutaneous TID AC & HS  . insulin glargine  15 Units Subcutaneous Daily  . melatonin  10 mg Oral QHS  . natamycin  1  drop Right Eye Q2H while awake  . pantoprazole  40 mg Oral QPM  . polyethylene glycol  17 g Oral Daily  . potassium chloride  40 mEq Oral BID  . prednisoLONE acetate  1 drop Right Eye QID  . rosuvastatin  10 mg Oral QHS  . senna-docusate  1 tablet Oral Daily  . senna-docusate  1 tablet Oral BID  . tamsulosin  0.4 mg Oral QPC breakfast  . tobramycin 59m/mL (fortified) compounded eye drops  1 drop Right Eye Q2H  . traZODone  100 mg Oral QHS  . trimethoprim-polymyxin b  2 drop Right Eye Q6H  . Vancomycin 222mmL (fortified) compounded eye drops  1 drop Right Eye Q2H   Continuous Infusions: . ceFEPime (MAXIPIME) IV 2 g (10/25/19 2219)  . vancomycin      PRN Meds:.acetaminophen **OR** acetaminophen, ondansetron **OR** ondansetron (ZOFRAN) IV, polyethylene glycol, polyvinyl alcohol, sodium chloride flush  Subjective: Pt has no new complaints today. Tolerating abx  Allergies  Allergen Reactions  . Lipitor [Atorvastatin] Other (See Comments)    Sister states had to have Cardiac Surgery     Review of Systems: Review of Systems  Constitutional: Negative for chills and fever.  Respiratory: Negative.   Cardiovascular: Negative.   Musculoskeletal:       Pain is controlled.   Skin: Negative for rash.  All other systems reviewed and are negative.    Objective: Blood pressure 117/86, pulse 79, temperature (!) 97.5 F (36.4 C), temperature source Oral, resp. rate 18, height _0  (1.727 m), weight 83.9 kg, SpO2 98 %. Body mass index is 28.13 kg/m.  Physical Exam Constitutional:      Comments: Chronically ill appearing sitting up in bed.   HENT:     Head: Normocephalic and atraumatic.     Right Ear: External ear normal.     Left Ear: External ear normal.     Nose: Nose normal.  Eyes:     Comments: Left eye appears normal. Right eye is closed.  Pulmonary:     Effort: Pulmonary effort is normal. No respiratory distress.  Musculoskeletal:     Cervical back: Normal range of motion and neck supple.  Skin:    General: Skin is warm and dry.  Neurological:     Mental Status: He is alert.       Lab Results: Lab Results  Component Value Date   WBC 5.7 10/24/2019   HGB 9.0 (L) 10/24/2019   HCT 29.3 (L) 10/24/2019   MCV 87.5 10/24/2019   PLT 283 10/24/2019    Lab Results  Component Value Date   CREATININE 1.94 (H) 10/26/2019   BUN 19 10/26/2019   NA 143 10/26/2019   K 3.5 10/26/2019   CL 101 10/26/2019   CO2 31 10/26/2019    Lab Results  Component Value Date   ALT 19 10/22/2019   AST 18 10/22/2019   ALKPHOS 104 10/22/2019     Microbiology: Recent Results (from the past 240 hour(s))  Urine culture      Status: Abnormal   Collection Time: 10/21/19  1:55 AM   Specimen: In/Out Cath Urine  Result Value Ref Range Status   Specimen Description   Final    IN/OUT CATH URINE [facility]formed [facility] [place] [address]  Special Requests   Final    NONE Performed at [facility] [place] [address]  Culture 4,000 COLONIES/mL  ESCHERICHIA COLI (A)  Final   Report Status 10/23/2019 FINAL  Final   Organism ID, Bacteria ESCHERICHIA COLI (A)  Final      Susceptibility   Escherichia coli - MIC*    AMPICILLIN <=2 SENSITIVE Sensitive     CEFAZOLIN <=4 SENSITIVE Sensitive     CEFTRIAXONE <=0.25 SENSITIVE Sensitive     CIPROFLOXACIN <=0.25 SENSITIVE Sensitive     GENTAMICIN <=1 SENSITIVE Sensitive     IMIPENEM <=0.25 SENSITIVE Sensitive     NITROFURANTOIN <=16 SENSITIVE Sensitive     TRIMETH/SULFA <=20 SENSITIVE Sensitive     AMPICILLIN/SULBACTAM <=2 SENSITIVE Sensitive     PIP/TAZO <=4 SENSITIVE Sensitive     * 4,000 COLONIES/mL ESCHERICHIA COLI  Respiratory Panel by RT PCR (Flu A&B, Covid) - Nasopharyngeal Swab     Status: None   Collection Time: 10/21/19  2:53 AM   Specimen: Nasopharyngeal Swab  Result Value Ref Range Status   SARS Coronavirus 2 by RT PCR NEGATIVE NEGATIVE Final    Comment: (NOTE) SARS-CoV-2 target nucleic acids are NOT DETECTED.  The SARS-CoV-2 RNA is generally detectable in upper respiratoy specimens during the acute phase of infection. The lowest concentration of SARS-CoV-2 viral copies this assay can detect is 131 copies/mL. A negative result does not preclude SARS-Cov-2 infection and should not be used as the sole basis for treatment or other patient management decisions. A negative result may occur with  improper specimen collection/handling, submission of specimen other than nasopharyngeal swab, presence of viral mutation(s) within the areas targeted by this assay, and  inadequate number of viral copies (<131 copies/mL). A negative result must be combined with clinical observations, patient history, and epidemiological information. The expected result is Negative.  Fact Sheet for Patients:  PinkCheek.be  Fact Sheet for Healthcare Providers:  GravelBags.it  This test is no t yet approved or cleared by the [place] FDA and  has been authorized for detection and/or diagnosis of SARS-CoV-2 by FDA under an Emergency Use Authorization (EUA). This EUA will remain  in effect (meaning this test can be used) for the duration of the COVID-19 declaration under Section 564(b)(1) of the Act, 21 U.S.C. section 360bbb-3(b)(1), unless the authorization is terminated or revoked sooner.     Influenza A by PCR NEGATIVE NEGATIVE Final   Influenza B by PCR NEGATIVE NEGATIVE Final    Comment: (NOTE) The Xpert Xpress SARS-CoV-2/FLU/RSV assay is intended as an aid in  the diagnosis of influenza from Nasopharyngeal swab specimens and  should not be used as a sole basis for treatment. Nasal washings and  aspirates are unacceptable for Xpert Xpress SARS-CoV-2/FLU/RSV  testing.  Fact Sheet for Patients: PinkCheek.be  Fact Sheet for Healthcare Providers: GravelBags.it  This test is not yet approved or cleared by the [place] FDA and  has been authorized for detection and/or diagnosis of SARS-CoV-2 by  FDA under an Emergency Use Authorization (EUA). This EUA will remain  in effect (meaning this test can be used) for the duration of the  Covid-19 declaration under Section 564(b)(1) of the Act, 21  U.S.C. section 360bbb-3(b)(1), unless the authorization is  terminated or revoked. Performed at [facility], [place] [address] [place],  [address]   Culture, blood (routine x 2)     Status: None   Collection Time: 10/21/19  7:24  AM   Specimen: BLOOD LEFT HAND  Result Value Ref Range Status   Specimen Description   Final    BLOOD LEFT HAND Performed at  [facility] [place] [address] [place], [place] [address]    Special Requests   Final    BOTTLES DRAWN AEROBIC ONLY Blood Culture adequate volume Performed at [facility] [address], [place], [place] [address]    Culture   Final    NO GROWTH 5 DAYS Performed at [facility], [place] [address], [place], [place] [address]    Report Status 10/26/2019 FINAL  Final  Culture, blood (routine x 2)     Status: None   Collection Time: 10/21/19  7:24 AM   Specimen: BLOOD  Result Value Ref Range Status   Specimen Description   Final    BLOOD LEFT THUMB Performed at [facility] [address], [place], [place] [address]    Special Requests   Final    BOTTLES DRAWN AEROBIC ONLY Blood Culture adequate volume Performed at [facility] [address] [place], [place] [address]    Culture   Final    NO GROWTH 5 DAYS Performed at [facility], [place] [address] [place], [place] [address]    Report Status 10/26/2019 FINAL  Final  C Difficile Quick Screen w PCR reflex     Status: None   Collection Time: 10/21/19 10:42 AM   Specimen: STOOL  Result Value Ref Range Status   C Diff antigen NEGATIVE NEGATIVE Final   C Diff toxin NEGATIVE NEGATIVE Final   C Diff interpretation No C. difficile detected.  Final    Comment: Performed at [facility], [place] [address] [place], [place] [address]     [facility]ious [facility] [phone] pager 10/26/2019, 8:30 AM

## 2019-10-26 NOTE — Discharge Summary (Signed)
Physician Discharge Summary  Eric Morton RAQ:[id number] DOB: 1965/06/20 DOA: 10/20/2019  PCP: [name], NP  Admit date: 10/20/2019 Discharge date: 10/26/2019  Time spent: 65 minutes  Recommendations for Outpatient Follow-up:  1. Follow-up with Dr. Valetta Close, ophthalmology within 1 week post discharge for follow-up on endophthalmitis of the right eye. 2. Follow-up with Dr. Juleen China, ID as scheduled on 11/18/2019. 3. Follow-up with MD at skilled nursing facility.  Patient will need a basic metabolic profile done in 1 week to follow-up on electrolytes and renal function.  Patient also need a CBC done to follow-up on H&H.  Urinary retention unable to be reassessed and if patient with no improvement on Flomax will need to place Foley catheter and schedule outpatient follow-up with urology. 4. Follow-up with Dr. [name], orthopedics for diabetic foot wound and concern for possible osteomyelitis.  Follow-up in 2 weeks.   Discharge Diagnoses:  Principal Problem:   Type 2 diabetes mellitus with diabetic polyneuropathy, without long-term current use of insulin (HCC) Active Problems:   Endophthalmitis   Essential hypertension   History of CVA (cerebrovascular accident)   Hyperlipidemia associated with type 2 diabetes mellitus ([place])   Generalized weakness   AKI (acute kidney injury) ([place])   Acute urinary retention   GERD without esophagitis   Bacterial conjunctivitis of right eye   Osteomyelitis ([place])   Urinary retention   Discharge Condition: Stable and improved  Diet recommendation: Heart healthy  Filed Weights   10/20/19 2354  Weight: 83.9 kg    History of present illness:  HPI per Dr. Cyd Silence 54 year old male with past medical history of chronic kidney disease stage III, hyperlipidemia, multiple previous strokes with significant debility, insulin-dependent diabetes mellitus type 2, hypertension, gastroesophageal reflux disease who presented to [facility] long hospital emergency  department from his skilled nursing facility with rapidly progressive weakness.  Patient explained that for approximately the past 2 weeks he has been experiencing progressively worsening right foot pain associated with a right foot ulcer that he has had for approximately past 1-1/2 months.  Patient stated that in the past 2 weeks the foot ulcer has become larger and more foul-smelling.  Patient denied any associated fevers.  Patient is not very forthcoming about the characteristics of the pain however but states that it can become severe at times.  Patient symptoms continue to persist in approximately 2 days ago the patient began to develop severe generalized weakness.  This weakness was rapidly progressive.  Patient denies any associated fevers, sick contacts, recent travel, confirmed contact with COVID-19, dysuria, diarrhea or low back pain.  Because of patient's rapidly progressive generalized weakness patient eventually presented to [facility] emergency department for evaluation.  Upon evaluation in the emergency department, patient was found to have acute kidney injury superimposed on his known chronic kidney disease.  Patient was also noted to be suffering from urinary retention.  The hospitalist group was then called to assess patient for admission to the hospital.  Hospital Course:  #1 Endophthalmitis of the right eye Patient had presented to the hospitalization concern for generalized weakness, patient had noted to being followed by ophthalmology for ocular infection with a prior history of a reportable retinal detachment.  Patient was noted to have nidus of infection to be a suture abscess in the cornea for which patient noted to have a corneal ulcer.  Ophthalmology was consulted and patient seen in consultation by Dr. Valetta Close.  Patient noted to have had moxifloxacin injection into the anterior chamber in clinic  prior to admission and per ophthalmology cultures with no growth to  date.  Patient subsequently underwent supratemporal subconjunctival injection of vancomycin and ceftazidime per ophthalmology on 10/25/2019.  Ophthalmology also recommended fortified vancomycin, tobramycin, neomycin eyedrops which patient will be discharged on.  Patient will follow up with ophthalmology within 1 week post discharge and outpatient setting.  Patient be discharged in stable and improved condition.  2.  Right-sided diabetic foot ulcer with possible osteomyelitis of the first metatarsal head Patient on admission due to concerns for diabetic foot ulcer placed empirically on IV antibiotics.  ID was consulted.  Orthopedics also consulted who recommended local wound care with dressing changes and had recommended first ray amputation if wound worsened otherwise outpatient follow-up with Dr. [name].  Hickman's line placed by IR 10/25/2019 for extended IV antibiotics.  ID recommended existing week course of IV daptomycin IV cefepime.  Patient remained afebrile.  CRP noted at 7.6.[name]  Due to facility unable to obtain daptomycin, ID recommended IV vancomycin.  Patient will need a total of 6 weeks of IV antibiotics and will follow up with ID, Dr. Juleen China in the outpatient setting on 11/18/2019.  3.  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  Patient maintained on aspirin and Plavix for secondary stroke prophylaxis.  Antiplatelets were initially held for possible procedure but subsequently resumed.  Patient was also maintained on statin.  4.  Acute on chronic kidney disease stage IIIa Patient noted to have a baseline creatinine of 1.6-2.2.  On admission patient noted to have a creatinine as high as 4.2.  Foley catheter was placed due to concern for mild bilateral hydronephrosis.  Also concern for urinary retention and patient subsequently started on Flomax.  Nephrology was consulted and followed the patient throughout the  hospitalization.  Repeat renal ultrasound done on day of discharge was negative for hydronephrosis.  Foley catheter was removed.  Renal function trended down and creatinine was down to 1.94 by day of discharge.  Patient's Zestoretic was discontinued on discharge.  Outpatient follow-up.  5.  Hypertension Patient's antihypertensive medications were held during the hospitalization due to worsening renal function.  Patient's Zestoretic was discontinued on discharge.  Patient's Norvasc will be resumed on discharge.  Outpatient follow-up.  6.  Hyperlipidemia Patient maintained on a statin.  7.  Gastroesophageal reflux disease Patient maintained on a PPI.  8.  Insulin-dependent diabetes mellitus Patient maintained on home regimen of Lantus 15 units daily as well as sliding scale insulin as well as gabapentin for diabetic neuropathy.  Outpatient follow-up.  9.  Acute urinary retention Felt secondary to BPH.  Foley catheter was placed and patient started on Flomax.  Foley catheter was discontinued on discharge.  Post void residual was 120 cc.  If patient continues to have acute urinary retention on discharge may need to place a Foley catheter with outpatient follow-up with urology.  10.  Hypokalemia/hypomagnesemia Repleted.  11.  Anemia of acute illness superimposed on anemia of chronic disease Patient noted to have a baseline hemoglobin of 10 and hemoglobin stabilized at 8-9.  Anemia panel done showed adequate levels of iron, B12 and folate levels.  Outpatient follow-up.  Procedures:  CT right foot 10/21/2019  CT renal stone protocol 10/21/2019  Renal ultrasound 10/26/2019  Plain films of the right foot 10/21/2019  Supra temporal subconjunctival injection of vancomycin and ceftazidime by ophthalmology, Dr. Valetta Close 10/25/2019  Hickman line per IR 10/25/2019  Consultations:  Ophthalmology  ID  Nephrology  Orthopedics.   Discharge Exam: Vitals:   10/25/19 1352 10/26/19 1323  BP: 117/86  118/82  Pulse: 79 81  Resp: 18 18  Temp:  97.9 F (36.6 C)  SpO2: 98% 98%    General: NAD Cardiovascular: RRR Respiratory: CTAB anterior lung fields.  No wheezes, no crackles, no rhonchi.  Discharge Instructions   Discharge Instructions    Diet - low sodium heart healthy   Complete by: As directed    Discharge wound care:   Complete by: As directed    As above   Home infusion instructions   Complete by: As directed    Instructions: Flushing of vascular access device: 0.9% NaCl pre/post medication administration and prn patency; Heparin 100 u/ml, 39ml for implanted ports and Heparin 10u/ml, 66ml for all other central venous catheters.   Increase activity slowly   Complete by: As directed      Allergies as of 10/26/2019      Reactions   Lipitor [atorvastatin] Other (See Comments)   Sister states had to have Cardiac Surgery      Medication List    STOP taking these medications   amoxicillin 500 MG capsule Commonly known as: AMOXIL   Besivance 0.6 % Susp Generic drug: Besifloxacin HCl   Durezol 0.05 % Emul Generic drug: Difluprednate   lisinopril-hydrochlorothiazide 20-25 MG tablet Commonly known as: ZESTORETIC   moxifloxacin 400 MG tablet Commonly known as: AVELOX     TAKE these medications   acetaminophen 325 MG tablet Commonly known as: TYLENOL Take 650 mg by mouth every 6 (six) hours as needed for mild pain.   amLODipine 5 MG tablet Commonly known as: NORVASC Take 1 tablet (5 mg total) by mouth daily.   aspirin 81 MG chewable tablet Chew 81 mg by mouth daily.   atropine 1 % ophthalmic solution Place 1 drop into the right eye 2 (two) times a day.   Basaglar KwikPen 100 UNIT/ML Inject 15 Units into the skin daily.   ceFEPime  IVPB Commonly known as: MAXIPIME Inject 2 g into the vein every 12 (twelve) hours. Indication:  Diabetic foot infection/osteomyelitis First Dose: Yes Last Day of Therapy:  12/02/2019 Labs - Once weekly:  CBC/D and BMP, Labs  - Every other week:  ESR and CRP Method of administration: IV Push Method of administration may be changed at the discretion of home infusion pharmacist based upon assessment of the patient and/or caregiver's ability to self-administer the medication ordered.   CEROVITE PO Take 1 tablet by mouth daily.   clopidogrel 75 MG tablet Commonly known as: PLAVIX Take 1 tablet (75 mg total) by mouth daily.   dicyclomine 20 MG tablet Commonly known as: BENTYL Take 20 mg by mouth 2 (two) times daily.   gabapentin 300 MG capsule Commonly known as: NEURONTIN Take 1 capsule (300 mg total) by mouth 2 (two) times daily. What changed: when to take this   glipiZIDE 10 MG tablet Commonly known as: GLUCOTROL Take 1 tablet (10 mg total) by mouth 2 (two) times daily before a meal.   glucose blood test strip Commonly known as: True Metrix Blood Glucose Test 1 each by Other route 3 (three) times daily.   HYDROcodone-acetaminophen 5-325 MG tablet Commonly known as: NORCO/VICODIN Take 1 tablet by mouth every 4 (four) hours as needed for moderate pain. What changed: See the new instructions.   insulin aspart 100 UNIT/ML injection Commonly known as: novoLOG Inject 2-8 Units into the skin daily as needed for high blood sugar.  melatonin 5 MG Tabs Take 10 mg by mouth at bedtime.   natamycin 5 % ophthalmic suspension Commonly known as: NATACYN 1 DROP EVERY 2 HOURS TO RIGHT EYE WHILE AWAKE.   NONFORMULARY OR COMPOUNDED ITEM Place 1 drop into the right eye every 2 (two) hours for 3 days.   NONFORMULARY OR COMPOUNDED ITEM Place 1 drop into the right eye every 2 (two) hours for 3 days.   pantoprazole 40 MG tablet Commonly known as: PROTONIX TAKE 1 TABLET(40 MG) BY MOUTH DAILY AT 6 AM What changed:   how much to take  how to take this  when to take this  additional instructions   polyethylene glycol 17 g packet Commonly known as: MIRALAX / GLYCOLAX Take 17 g by mouth daily. Start  taking on: October 27, 2019   polyvinyl alcohol 1.4 % ophthalmic solution Commonly known as: LIQUIFILM TEARS Place 1 drop into the right eye as needed for dry eyes (place in eye before giving eye drops).   prednisoLONE acetate 1 % ophthalmic suspension Commonly known as: PRED FORTE Place 1 drop into the right eye 4 (four) times daily.   rosuvastatin 40 MG tablet Commonly known as: CRESTOR TAKE 1 TABLET BY MOUTH DAILY AT 6 PM What changed:   how much to take  how to take this  when to take this  additional instructions   sennosides-docusate sodium 8.6-50 MG tablet Commonly known as: SENOKOT-S Take 1 tablet by mouth in the morning and at bedtime. What changed: when to take this   tamsulosin 0.4 MG Caps capsule Commonly known as: FLOMAX Take 1 capsule (0.4 mg total) by mouth daily after breakfast. Start taking on: October 27, 2019   traZODone 50 MG tablet Commonly known as: DESYREL Take 2 tablets (100 mg total) by mouth at bedtime.   trimethoprim-polymyxin b ophthalmic solution Commonly known as: POLYTRIM Place 2 drops into the right eye every 6 (six) hours.   True Metrix Go Glucose Meter w/Device Kit USE AS DIRECTED   TRUEplus Lancets 28G Misc 1 each by Does not apply route 3 (three) times daily.   vancomycin  IVPB Inject 1,000 mg into the vein daily. Indication: Diabetic foot infection/osteomyelitis of right foot  First Dose: Yes Last Day of Therapy:  12/02/2019 Labs - Sunday/Monday:  CBC/D, BMP, and vancomycin trough. Labs - Thursday:  BMP and vancomycin trough Labs - Every other week:  ESR and CRP Method of administration:Elastomeric Method of administration may be changed at the discretion of the patient and/or caregiver's ability to self-administer the medication ordered.            Home Infusion Instuctions  (From admission, onward)         Start     Ordered   10/26/19 0000  Home infusion instructions       Question:  Instructions  Answer:   Flushing of vascular access device: 0.9% NaCl pre/post medication administration and prn patency; Heparin 100 u/ml, 6m for implanted ports and Heparin 10u/ml, 555mfor all other central venous catheters.   10/26/19 1459           Discharge Care Instructions  (From admission, onward)         Start     Ordered   10/26/19 0000  Discharge wound care:       Comments: As above   10/26/19 1459         Allergies  Allergen Reactions  . Lipitor [Atorvastatin] Other (See Comments)  Sister states had to have Cardiac Surgery    Contact information for follow-up providers    Kathlynn Grate, DO Follow up on 11/18/2019.   Specialty: Internal Medicine Why: f/u as scheduled. Contact information: [address] [place] [address] [phone]        Nadara Mustard, MD. Schedule an appointment as soon as possible for a visit in 2 week(s).   Specialty: Orthopedic Surgery Contact information: [address] [place] [address] [phone]        Sinda Du, MD. Schedule an appointment as soon as possible for a visit in 1 week(s).   Specialty: Ophthalmology Why: f/u in 1 weeks. Contact information: [address] [place] [address] [phone]        MD AT SNF Follow up.            Contact information for after-discharge care    Destination    [facility] .   Service: Skilled Nursing Contact information: [address] [place]. [place] [address] [phone]                   The results of significant diagnostics from this hospitalization (including imaging, microbiology, ancillary and laboratory) are listed below for reference.    Significant Diagnostic Studies: US RENAL  Result Date: 10/26/2019 CLINICAL DATA:  Hydronephrosis EXAM: RENAL / URINARY TRACT ULTRASOUND COMPLETE COMPARISON:  10/21/2019 FINDINGS: Right Kidney: Renal measurements: 12.1 x 5.8 x 4.8 cm = volume: 178 mL. Echogenicity within  normal limits. No mass or hydronephrosis visualized. Left Kidney: Renal measurements: 12.7 x 6.2 x 5.3 cm = volume: 220 mL. Echogenicity within normal limits. No mass or hydronephrosis visualized. Bladder: Decompressed by Foley catheter Other: None. IMPRESSION: No acute finding by renal ultrasound.  Negative for hydronephrosis. Bladder decompressed by Foley. Electronically Signed   By: Judie Petit.  Shick M.D.   On: 10/26/2019 09:57   CT FOOT RIGHT WO CONTRAST  Result Date: 10/21/2019 CLINICAL DATA:  Diabetic with bilateral foot wounds, worsening on the right. Clinical concern for osteomyelitis. EXAM: CT OF THE RIGHT FOOT WITHOUT CONTRAST TECHNIQUE: Multidetector CT imaging of the right foot was performed according to the standard protocol. Multiplanar CT image reconstructions were also generated. COMPARISON:  Radiographs 10/21/2019.  No other comparison studies. FINDINGS: Bones/Joint/Cartilage There is soft tissue ulceration medial to the 1st metatarsal head, as seen on earlier radiographs. There is cortical irregularity of the adjacent 1st metatarsal head medially with bony fragmentation and possible osteolysis, suspicious for osteomyelitis. There is underlying joint space narrowing, small erosions and osteophytes within the 1st metatarsal head and its sesamoids. No other findings suspicious for osteomyelitis. There is no evidence of acute fracture or dislocation. The bones are mildly demineralized. There are mild talonavicular degenerative changes. Probable effusion of the 1st metatarsophalangeal joint. No other significant effusions identified. Ligaments Suboptimally assessed by CT. Muscles and Tendons Grossly intact ankle tendons without significant tenosynovitis. Mild generalized muscular atrophy without focal fluid collection. Soft tissues As above, soft tissue ulceration medial to 1st metatarsal head with underlying soft tissue swelling and an effusion of the 1st MTP joint. No focal fluid collection, soft tissue  emphysema or foreign body seen. Prominent vascular calcifications are noted, consistent with diabetes. IMPRESSION: 1. Soft tissue ulceration medial to the 1st metatarsal head with underlying cortical irregularity and possible osteolysis, suspicious for osteomyelitis. 2. Underlying degenerative changes at the 1st MTP joint with nonspecific joint effusion. 3. No evidence of soft tissue emphysema or  foreign body. Electronically Signed   By: Richardean Sale M.D.   On: 10/21/2019 15:55   IR Fluoro Guide CV Line Right  Result Date: 10/25/2019 INDICATION: 54 year old male with acute hematogenous osteomyelitis in need of durable venous access for outpatient IV antibiotic therapy. Infectious disease is requesting a Hickman catheter. EXAM: IR RIGHT FLUORO GUIDE CV LINE MEDICATIONS: In patient currently receiving intravenous antibiotics. No additional antibiotic prophylaxis was administered. ANESTHESIA/SEDATION: None. FLUOROSCOPY TIME:  Fluoroscopy Time: 0 minutes 16 seconds (4 mGy). COMPLICATIONS: None immediate. PROCEDURE: Informed written consent was obtained from the patient after a thorough discussion of the procedural risks, benefits and alternatives. All questions were addressed. Maximal Sterile Barrier Technique was utilized including caps, mask, sterile gowns, sterile gloves, sterile drape, hand hygiene and skin antiseptic. A timeout was performed prior to the initiation of the procedure. The right internal jugular vein was interrogated with ultrasound and found to be widely patent. An image was obtained and stored for the medical record. Local anesthesia was attained by infiltration with 1% lidocaine. A small dermatotomy was made. Under real-time sonographic guidance, the vessel was punctured with a 21 gauge micropuncture needle. Using standard technique, the initial micro needle was exchanged over a 0.018 micro wire for a transitional 4 [place] micro sheath. A 0.035 J wire was then advanced through the right  heart and into the inferior vena cava. A suitable skin exit site was selected. Local anesthesia was again attained by infiltration with 1% lidocaine. A small dermatotomy was made. A single lumen Hickman tunneled catheter was then tunneled from the skin exit site to the dermatotomy overlying the venous access site. The catheter was cut to an appropriate length. The 4 [place] transitional micro sheath was then exchanged over the J wire for a peel-away sheath which was advanced into the right heart. The Hickman catheter was then advanced through the peel-away sheath and the peel-away sheath was discarded. The catheter tip is well positioned in the upper right atrium. An image was obtained and stored for the medical record. The catheter flushes and aspirates easily. It was flushed, capped and secured to the skin with 0 Prolene suture. Sterile bandages were applied. IMPRESSION: Placement of a right IJ approach tunneled Hickman catheter. The catheter tip is in the upper right atrium and ready for immediate use. Electronically Signed   By: Jacqulynn Cadet M.D.   On: 10/25/2019 17:05   IR US Guide Vasc Access Right  Result Date: 10/26/2019 INDICATION: Add on code.  Please see original dictation. EXAM: IR ULTRASOUND GUIDANCE VASC ACCESS RIGHT MEDICATIONS: Add on code. Please see original dictation. ANESTHESIA/SEDATION: Add on code. Please see original dictation. FLUOROSCOPY TIME:  Add on code. Please see original dictation. COMPLICATIONS: None immediate. PROCEDURE: Add on code. Please see original dictation. IMPRESSION: Add on code. Please see original dictation. Electronically Signed   By: Jacqulynn Cadet M.D.   On: 10/26/2019 13:01   DG Foot 2 Views Right  Result Date: 10/21/2019 CLINICAL DATA:  Right foot wound EXAM: RIGHT FOOT - 2 VIEW COMPARISON:  None. FINDINGS: Two view radiograph right foot demonstrates a soft tissue defect medial to the first metatarsal head with mild associated soft tissue swelling but  no retained radiopaque foreign body or subjacent osseous erosion. Mild first ray bunion deformity. Mild degenerative arthritis at the first metatarsophalangeal joint. No acute fracture or dislocation. Remaining joint spaces are preserved. Vascular calcifications are seen within the soft tissues. Mild right forefoot soft tissue swelling. No ankle effusion. IMPRESSION: Soft tissue  ulceration and associated soft tissue swelling. No associated osseous erosion or retained radiopaque foreign body. Electronically Signed   By: Helyn Numbers MD   On: 10/21/2019 00:44   CT Renal Stone Study  Result Date: 10/21/2019 CLINICAL DATA:  Acute renal failure, hypertension EXAM: CT ABDOMEN AND PELVIS WITHOUT CONTRAST TECHNIQUE: Multidetector CT imaging of the abdomen and pelvis was performed following the standard protocol without IV contrast. COMPARISON:  None. FINDINGS: Lower chest: The visualized lung bases are clear. Cardiac size within normal limits. No pericardial effusion. Hepatobiliary: No focal liver abnormality is seen. No gallstones, gallbladder wall thickening, or biliary dilatation. Pancreas: Unremarkable Spleen: Unremarkable Adrenals/Urinary Tract: There is mild an in bilateral hydronephrosis and mild distention of the bladder which, together, may reflect changes of voluntary urinary retention or bladder outlet obstruction. The kidneys are normal in size and position. No intrarenal or ureteral calculi are identified. Mild bilateral nonspecific perinephric stranding is seen. The adrenal glands are unremarkable. Stomach/Bowel: The stomach, small bowel, and large bowel are unremarkable. Appendix normal. No free intraperitoneal gas or fluid. Vascular/Lymphatic: Mild aortoiliac atherosclerotic calcification without evidence of aneurysm. No pathologic adenopathy within the abdomen and pelvis. Reproductive: The prostate gland is normal in size. Seminal vesicles are unremarkable. Extensive calcification of the vas deferens  is seen, a finding that can be seen the setting of longstanding diabetes mellitus. Other: Rectum unremarkable. Musculoskeletal: No acute bone abnormality. IMPRESSION: Mild bilateral hydronephrosis and moderate distention of the bladder suggesting changes of either voluntary retention or bladder outlet obstruction. Aortic Atherosclerosis (ICD10-I70.0). Electronically Signed   By: Helyn Numbers MD   On: 10/21/2019 04:54    Microbiology: Recent Results (from the past 240 hour(s))  Urine culture     Status: Abnormal   Collection Time: 10/21/19  1:55 AM   Specimen: In/Out Cath Urine  Result Value Ref Range Status   Specimen Description   Final    IN/OUT CATH URINE Performed at [facility], [address], [place], [place] [address]    Special Requests   Final    NONE Performed at [facility], [address], [place], [place] [id number]    Culture 4,000 COLONIES/mL ESCHERICHIA COLI (A)  Final   Report Status 10/23/2019 FINAL  Final   Organism ID, Bacteria ESCHERICHIA COLI (A)  Final      Susceptibility   Escherichia coli - MIC*    AMPICILLIN <=2 SENSITIVE Sensitive     CEFAZOLIN <=4 SENSITIVE Sensitive     CEFTRIAXONE <=0.25 SENSITIVE Sensitive     CIPROFLOXACIN <=0.25 SENSITIVE Sensitive     GENTAMICIN <=1 SENSITIVE Sensitive     IMIPENEM <=0.25 SENSITIVE Sensitive     NITROFURANTOIN <=16 SENSITIVE Sensitive     TRIMETH/SULFA <=20 SENSITIVE Sensitive     AMPICILLIN/SULBACTAM <=2 SENSITIVE Sensitive     PIP/TAZO <=4 SENSITIVE Sensitive     * 4,000 COLONIES/mL ESCHERICHIA COLI  Respiratory Panel by RT PCR (Flu A&B, Covid) - Nasopharyngeal Swab     Status: None   Collection Time: 10/21/19  2:53 AM   Specimen: Nasopharyngeal Swab  Result Value Ref Range Status   SARS Coronavirus 2 by RT PCR NEGATIVE NEGATIVE Final    Comment: (NOTE) SARS-CoV-2 target nucleic acids are NOT DETECTED.  The SARS-CoV-2 RNA is generally detectable in upper  respiratoy specimens during the acute phase of infection. The lowest concentration of SARS-CoV-2 viral copies this assay can detect is 131 copies/mL. A negative result does not preclude SARS-Cov-2 infection and should  not be used as the sole basis for treatment or other patient management decisions. A negative result may occur with  improper specimen collection/handling, submission of specimen other than nasopharyngeal swab, presence of viral mutation(s) within the areas targeted by this assay, and inadequate number of viral copies (<131 copies/mL). A negative result must be combined with clinical observations, patient history, and epidemiological information. The expected result is Negative.  Fact Sheet for Patients:  PinkCheek.be  Fact Sheet for Healthcare Providers:  GravelBags.it  This test is no t yet approved or cleared by the [place] FDA and  has been authorized for detection and/or diagnosis of SARS-CoV-2 by FDA under an Emergency Use Authorization (EUA). This EUA will remain  in effect (meaning this test can be used) for the duration of the COVID-19 declaration under Section 564(b)(1) of the Act, 21 U.S.C. section 360bbb-3(b)(1), unless the authorization is terminated or revoked sooner.     Influenza A by PCR NEGATIVE NEGATIVE Final   Influenza B by PCR NEGATIVE NEGATIVE Final    Comment: (NOTE) The Xpert Xpress SARS-CoV-2/FLU/RSV assay is intended as an aid in  the diagnosis of influenza from Nasopharyngeal swab specimens and  should not be used as a sole basis for treatment. Nasal washings and  aspirates are unacceptable for Xpert Xpress SARS-CoV-2/FLU/RSV  testing.  Fact Sheet for Patients: PinkCheek.be  Fact Sheet for Healthcare Providers: GravelBags.it  This test is not yet approved or cleared by the [place] FDA and  has been  authorized for detection and/or diagnosis of SARS-CoV-2 by  FDA under an Emergency Use Authorization (EUA). This EUA will remain  in effect (meaning this test can be used) for the duration of the  Covid-19 declaration under Section 564(b)(1) of the Act, 21  U.S.C. section 360bbb-3(b)(1), unless the authorization is  terminated or revoked. Performed at [facility], [place] [address] [place], [place] [address]   Culture, blood (routine x 2)     Status: None   Collection Time: 10/21/19  7:24 AM   Specimen: BLOOD LEFT HAND  Result Value Ref Range Status   Specimen Description   Final    BLOOD LEFT HAND Performed at [place] [address] [place], [place] [address]    Special Requests   Final    BOTTLES DRAWN AEROBIC ONLY Blood Culture adequate volume Performed at [place] [address] [place], [place] [address]    Culture   Final    NO GROWTH 5 DAYS Performed at [facility], [place] [address], [place], [place] [address]    Report Status 10/26/2019 FINAL  Final  Culture, blood (routine x 2)     Status: None   Collection Time: 10/21/19  7:24 AM   Specimen: BLOOD  Result Value Ref Range Status   Specimen Description   Final    BLOOD LEFT THUMB Performed at [place] [address], [place], [place] [address]    Special Requests   Final    BOTTLES DRAWN AEROBIC ONLY Blood Culture adequate volume Performed at [place] [address], [place], [place] [address]    Culture   Final    NO GROWTH 5 DAYS Performed at [facility], [place] [address] [place], [place] [address]    Report Status 10/26/2019 FINAL  Final  C Difficile Quick Screen w PCR reflex     Status: None   Collection Time: 10/21/19 10:42 AM  Specimen: STOOL  Result Value Ref Range Status   C Diff antigen NEGATIVE NEGATIVE Final   C Diff toxin NEGATIVE NEGATIVE Final   C Diff interpretation No  C. difficile detected.  Final    Comment: Performed at [facility], [place] [address], [place], [place] [address]  Respiratory Panel by RT PCR (Flu A&B, Covid) - Nasopharyngeal Swab     Status: None   Collection Time: 10/26/19 10:29 AM   Specimen: Nasopharyngeal Swab  Result Value Ref Range Status   SARS Coronavirus 2 by RT PCR NEGATIVE NEGATIVE Final    Comment: (NOTE) SARS-CoV-2 target nucleic acids are NOT DETECTED.  The SARS-CoV-2 RNA is generally detectable in upper respiratoy specimens during the acute phase of infection. The lowest concentration of SARS-CoV-2 viral copies this assay can detect is 131 copies/mL. A negative result does not preclude SARS-Cov-2 infection and should not be used as the sole basis for treatment or other patient management decisions. A negative result may occur with  improper specimen collection/handling, submission of specimen other than nasopharyngeal swab, presence of viral mutation(s) within the areas targeted by this assay, and inadequate number of viral copies (<131 copies/mL). A negative result must be combined with clinical observations, patient history, and epidemiological information. The expected result is Negative.  Fact Sheet for Patients:  PinkCheek.be  Fact Sheet for Healthcare Providers:  GravelBags.it  This test is no t yet approved or cleared by the [place] FDA and  has been authorized for detection and/or diagnosis of SARS-CoV-2 by FDA under an Emergency Use Authorization (EUA). This EUA will remain  in effect (meaning this test can be used) for the duration of the COVID-19 declaration under Section 564(b)(1) of the Act, 21 U.S.C. section 360bbb-3(b)(1), unless the authorization is terminated or revoked sooner.     Influenza A by PCR NEGATIVE NEGATIVE Final   Influenza B by PCR NEGATIVE NEGATIVE Final    Comment: (NOTE) The Xpert Xpress  SARS-CoV-2/FLU/RSV assay is intended as an aid in  the diagnosis of influenza from Nasopharyngeal swab specimens and  should not be used as a sole basis for treatment. Nasal washings and  aspirates are unacceptable for Xpert Xpress SARS-CoV-2/FLU/RSV  testing.  Fact Sheet for Patients: PinkCheek.be  Fact Sheet for Healthcare Providers: GravelBags.it  This test is not yet approved or cleared by the [place] FDA and  has been authorized for detection and/or diagnosis of SARS-CoV-2 by  FDA under an Emergency Use Authorization (EUA). This EUA will remain  in effect (meaning this test can be used) for the duration of the  Covid-19 declaration under Section 564(b)(1) of the Act, 21  U.S.C. section 360bbb-3(b)(1), unless the authorization is  terminated or revoked. Performed at [facility], [place] [address], [place], [place] [address]      Labs: Basic Metabolic Panel: Recent Labs  Lab 10/22/19 0521 10/23/19 0919 10/24/19 0829 10/25/19 1005 10/26/19 0436  NA 139 141 142 139 143  K 3.5 2.9* 3.5 3.2* 3.5  CL 104 99 98 98 101  CO2 _0 GLUCOSE 153* 116* 101* 144* 79  BUN 34* 27* 23* 19 19  CREATININE 3.26* 2.87* 2.62* 1.99* 1.94*  CALCIUM 8.3* 8.0* 8.4* 8.1* 8.1*  MG 1.6* 1.6* 1.5* 1.7 2.2  PHOS  --  3.6  --   --   --    Liver Function Tests: Recent Labs  Lab 10/21/19 0039 10/21/19 0724 10/22/19 0521  AST 38 24  18  ALT _0 ALKPHOS 135* 118 104  BILITOT 0.9 0.7 0.5  PROT 7.6 6.3* 6.2*  ALBUMIN 3.1* 2.6* 2.5*   No results for input(s): LIPASE, AMYLASE in the last 168 hours. No results for input(s): AMMONIA in the last 168 hours. CBC: Recent Labs  Lab 10/21/19 0039 10/21/19 0039 10/21/19 0046 10/22/19 0521 10/23/19 0919 10/24/19 0829 10/26/19 0925  WBC 8.5  --   --  6.4 6.6 5.7 6.5  NEUTROABS 6.4  --   --  4.0  --   --  4.1  HGB 9.2*   < > 9.2* 8.6* 8.6* 9.0* 8.6*   HCT 29.4*   < > 27.0* 26.8* 27.2* 29.3* 28.6*  MCV 84.0  --   --  84.3 85.5 87.5 88.3  PLT 280  --   --  249 279 283 317   < > = values in this interval not displayed.   Cardiac Enzymes: Recent Labs  Lab 10/21/19 1305 10/22/19 0521  CKTOTAL 120 128   BNP: BNP (last 3 results) No results for input(s): BNP in the last 8760 hours.  ProBNP (last 3 results) No results for input(s): PROBNP in the last 8760 hours.  CBG: Recent Labs  Lab 10/25/19 1659 10/25/19 2004 10/26/19 0749 10/26/19 1129 10/26/19 1529  GLUCAP 282* 270* 91 177* 188*       Signed:  Irine Seal MD.  [facility] 10/26/2019, 3:32 PM

## 2019-10-26 NOTE — TOC Transition Note (Addendum)
Transition of Care [facility]) - CM/SW Discharge Note   Patient Details  Name: Eric Morton MRN: [id number] Date of Birth: 1966-01-18  Transition of Care [facility]) CM/SW Contact:  Dessa Phi, RN Phone Number: 10/26/2019, 10:20 AM   Clinical Narrative:Received auth for SNF-[facility] per Lorrie-await covid test results;d/c summary if stable;manual script for iv abx.PTAR for transport when ready. 3:45p-d/c summary sent via hub to [facility]-going to rm 137,nsg report tel#[phone] called for 5p pick up. Patient to get iv abx prior PTAR-Nsg aware.No further CM needs.       Final next level of care: Skilled Nursing Facility Barriers to Discharge: No Barriers Identified   Patient Goals and CMS Choice Patient states their goals for this hospitalization and ongoing recovery are:: go to SNF CMS Medicare.gov Compare Post Acute Care list provided to:: Patient Choice offered to / list presented to : Patient  Discharge Placement                       Discharge Plan and Services   Discharge Planning Services: CM Consult                                 Social Determinants of Health (SDOH) Interventions     Readmission Risk Interventions No flowsheet data found.

## 2019-10-26 NOTE — TOC Progression Note (Signed)
Transition of Care [facility]) - Progression Note    Patient Details  Name: Eric Morton MRN: [id number] Date of Birth: 30-Oct-1965  Transition of Care [facility]) CM/SW Contact  Carena Stream, Juliann Pulse, RN Phone Number: 10/26/2019, 9:50 AM  Clinical Narrative:d/c plan to [facility]-awaiting SNF auth;long term iv abx-cefepime/vanc end date 11/5-need script,need covid once auth received.       Expected Discharge Plan: Skilled Nursing Facility Barriers to Discharge: Insurance Authorization  Expected Discharge Plan and Services Expected Discharge Plan: [place]   Discharge Planning Services: CM Consult   Living arrangements for the past 2 months: Assisted Living Facility                                       Social Determinants of Health (SDOH) Interventions    Readmission Risk Interventions No flowsheet data found.

## 2019-10-26 NOTE — Progress Notes (Signed)
PHARMACY CONSULT NOTE FOR:  OUTPATIENT  PARENTERAL ANTIBIOTIC THERAPY (OPAT)  Indication: Diabetic foot infection/osteomyelitis of the right foot Regimen: Vancomycin 1000 mg every 24 hours + Cefepime 2 gm q 12 hours End date: 12/02/19  IV antibiotic discharge orders are pended. To discharging provider:  please sign these orders via discharge navigator,  Select New Orders & click on the button choice - Manage This Unsigned Work.     Thank you for allowing pharmacy to be a part of this patient's care.  Jimmy Footman, PharmD, BCPS, BCIDP Infectious Diseases Clinical Pharmacist Phone: [phone] 10/26/2019, 8:03 AM

## 2019-10-26 NOTE — Plan of Care (Signed)

## 2019-10-26 NOTE — Progress Notes (Signed)
PT Cancellation Note  Patient Details Name: Eric Morton MRN: [id number] DOB: 06-19-1965   Cancelled Treatment:    Reason Eval/Treat Not Completed: Attempted PT tx session-pt declined to participate at this time. Pt requested for PT to check back another time. Will check back if schedule allows. Pt could possibly d/c today.    [facility]  Office: [phone] Pager: [phone]

## 2019-11-10 ENCOUNTER — Ambulatory Visit: Payer: Medicaid Other | Admitting: Orthopedic Surgery

## 2019-11-16 ENCOUNTER — Telehealth: Payer: Self-pay

## 2019-11-16 NOTE — Telephone Encounter (Signed)
Spoke with Vonna Kotyk at [facility] to request that most recent CBC, BMP, ESR, and CRP be faxed to [facility] triage. Vonna Kotyk will either fax labs or have labs drawn and faxed if there are no recent results, per Dr. Juleen China. Orders repeated and verified.   Beryle Flock, RN

## 2019-11-17 ENCOUNTER — Ambulatory Visit: Payer: Medicaid Other | Admitting: Orthopedic Surgery

## 2019-11-18 ENCOUNTER — Other Ambulatory Visit: Payer: Self-pay

## 2019-11-18 ENCOUNTER — Ambulatory Visit (INDEPENDENT_AMBULATORY_CARE_PROVIDER_SITE_OTHER): Payer: Medicaid Other | Admitting: Internal Medicine

## 2019-11-18 ENCOUNTER — Encounter: Payer: Self-pay | Admitting: Internal Medicine

## 2019-11-18 VITALS — BP 165/96 | HR 92 | Temp 97.3°F

## 2019-11-18 DIAGNOSIS — M86671 Other chronic osteomyelitis, right ankle and foot: Secondary | ICD-10-CM

## 2019-11-18 DIAGNOSIS — N289 Disorder of kidney and ureter, unspecified: Secondary | ICD-10-CM | POA: Diagnosis not present

## 2019-11-18 DIAGNOSIS — E1142 Type 2 diabetes mellitus with diabetic polyneuropathy: Secondary | ICD-10-CM | POA: Diagnosis not present

## 2019-11-18 NOTE — Assessment & Plan Note (Signed)
Will check labs today

## 2019-11-18 NOTE — Progress Notes (Signed)
[facility] for Infectious Disease  Reason for Consult: hospital follow up for OM   HPI:  Eric Morton is a 54 y.o. male who presents to clinic for hospital follow up for osteomyelitis.     He is a 54 y.o. male.  He has a hx of DM, CKD stage 3, CVA with debility, HTN admitted 10/21/19 to [facility] with progressive weakness in the setting of a right foot ulcer for approx 6 weeks and worsening pain for 2 weeks.  Found to have AKI on admission.  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   Orthopedics saw patient and recommended continued local wound care.  Patient not currently interested in ray amputation.  Plan is to follow up with Dr [name] on 11/24/19. He had a CT during admission that shows findings concerning for osteomyelitis.  He was discharged on vancomycin (SNF would not cover Daptomycin) and cefepime to complete a 6 week course of empiric antibiotics on 12/02/19 via a tunneled Hickman (PICC avoided due to CKD).  We have not been receiving his labs from SNF and he presents today for follow up.  He has been doing okay at [facility].  He is not particularly happy with the staff there and wants his feet elevated more.  He also is not happy with his roommate.  He denies fevers, chills, n/v/d.  His hickman is working well.  His foot has no pain or drainage, but does have swelling.  Labs 10/25/19: ESR: 140 CRP: 7.6 (normal = <1.0)  OPAT Orders: Indication: Diabetic foot infection/osteomyelitis of the right foot Regimen: Vancomycin 1000 mg every 24 hours + Cefepime 2 gm q 12 hours End date: 12/02/19     Patient's Medications  New Prescriptions   No medications on file  Previous Medications   ACETAMINOPHEN (TYLENOL) 325 MG TABLET    Take 650 mg by mouth every 6 (six) hours as needed for mild pain.   AMLODIPINE (NORVASC) 5 MG TABLET    Take 1 tablet (5 mg total) by mouth daily.   ASPIRIN 81 MG CHEWABLE TABLET    Chew 81 mg by mouth daily.    ATROPINE 1 % OPHTHALMIC SOLUTION    Place 1 drop into the right eye 2 (two) times a day.   BESIFLOXACIN HCL (BESIVANCE) 0.6 % SUSP    Apply to eye. 1 drop in right eye every 3 hours for conjunctivitis   BLOOD GLUCOSE MONITORING SUPPL (TRUE METRIX GO GLUCOSE METER) W/DEVICE KIT    USE AS DIRECTED   CEFEPIME (MAXIPIME) IVPB    Inject 2 g into the vein every 12 (twelve) hours. Indication:  Diabetic foot infection/osteomyelitis First Dose: Yes Last Day of Therapy:  12/02/2019 Labs - Once weekly:  CBC/D and BMP, Labs - Every other week:  ESR and CRP Method of administration: IV Push Method of administration may be changed at the discretion of home infusion pharmacist based upon assessment of the patient and/or caregiver's ability to self-administer the medication ordered.   CLOPIDOGREL (PLAVIX) 75 MG TABLET    Take 1 tablet (75 mg total) by mouth daily.   DICYCLOMINE (BENTYL) 20 MG TABLET    Take 20 mg by mouth 2 (two) times daily.   DIFLUPREDNATE (DUREZOL) 0.05 % EMUL    Apply to eye. In right eye two times a day for eye infection   GABAPENTIN (NEURONTIN) 300 MG CAPSULE    Take 1 capsule (300 mg total)  by mouth 2 (two) times daily.   GLIPIZIDE (GLUCOTROL) 10 MG TABLET    Take 1 tablet (10 mg total) by mouth 2 (two) times daily before a meal.   GLUCOSE BLOOD (TRUE METRIX BLOOD GLUCOSE TEST) TEST STRIP    1 each by Other route 3 (three) times daily.   HYDROCODONE-ACETAMINOPHEN (NORCO/VICODIN) 5-325 MG TABLET    Take 1 tablet by mouth every 4 (four) hours as needed for moderate pain.   INSULIN ASPART (NOVOLOG) 100 UNIT/ML INJECTION    Inject 2-8 Units into the skin daily as needed for high blood sugar.   INSULIN GLARGINE (BASAGLAR KWIKPEN) 100 UNIT/ML SOPN    Inject 15 Units into the skin daily.   MAGNESIUM OXIDE (MAG-OX) 400 MG TABLET    Take 400 mg by mouth at bedtime.   MELATONIN 5 MG TABS    Take 10 mg by mouth at bedtime.   MULTIPLE VITAMINS-MINERALS (CEROVITE PO)    Take 1 tablet by mouth daily.     NATAMYCIN (NATACYN) 5 % OPHTHALMIC SUSPENSION    1 DROP EVERY 2 HOURS TO RIGHT EYE WHILE AWAKE.   PANTOPRAZOLE (PROTONIX) 40 MG TABLET    TAKE 1 TABLET(40 MG) BY MOUTH DAILY AT 6 AM   POLYETHYLENE GLYCOL (MIRALAX / GLYCOLAX) 17 G PACKET    Take 17 g by mouth daily.   POLYVINYL ALCOHOL (LIQUIFILM TEARS) 1.4 % OPHTHALMIC SOLUTION    Place 1 drop into the right eye as needed for dry eyes (place in eye before giving eye drops).   PREDNISOLONE ACETATE (PRED FORTE) 1 % OPHTHALMIC SUSPENSION    Place 1 drop into the right eye 4 (four) times daily.   ROSUVASTATIN (CRESTOR) 40 MG TABLET    TAKE 1 TABLET BY MOUTH DAILY AT 6 PM   SENNOSIDES-DOCUSATE SODIUM (SENOKOT-S) 8.6-50 MG TABLET    Take 1 tablet by mouth in the morning and at bedtime.   TAMSULOSIN (FLOMAX) 0.4 MG CAPS CAPSULE    Take 1 capsule (0.4 mg total) by mouth daily after breakfast.   TRAZODONE (DESYREL) 50 MG TABLET    Take 2 tablets (100 mg total) by mouth at bedtime.   TRIMETHOPRIM-POLYMYXIN B (POLYTRIM) OPHTHALMIC SOLUTION    Place 2 drops into the right eye every 6 (six) hours.   TRUEPLUS LANCETS 28G MISC    1 each by Does not apply route 3 (three) times daily.   VANCOMYCIN IVPB    Inject 1,000 mg into the vein daily. Indication: Diabetic foot infection/osteomyelitis of right foot  First Dose: Yes Last Day of Therapy:  12/02/2019 Labs - Sunday/Monday:  CBC/D, BMP, and vancomycin trough. Labs - Thursday:  BMP and vancomycin trough Labs - Every other week:  ESR and CRP Method of administration:Elastomeric Method of administration may be changed at the discretion of the patient and/or caregiver's ability to self-administer the medication ordered.  Modified Medications   No medications on file  Discontinued Medications   No medications on file      Past Medical History:  Diagnosis Date  . 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 Anxiety   . Chronic kidney disease    "jsut a little damage"  . Glaucoma   . History  of stomach ulcers    not confirmed, never underwent diagnostic endo or radiology, this manifested as epigastric distress.   [name] Hx of adenomatous colonic polyps   . Hypercholesterolemia   . Hypertension   . Peripheral neuropathy   .  Polycythemia vera ([place]) 2006  . Renal insufficiency 07/16/2017  . Stroke [facility])    slurred speech  . Substance abuse ([place])   . Type II diabetes mellitus (HCC)     Social History   Tobacco Use  . Smoking status: Former Smoker    Packs/day: 0.25    Years: 32.00    Pack years: 8.00    Types: Cigarettes  . Smokeless tobacco: Never Used  Vaping Use  . Vaping Use: Never used  Substance Use Topics  . Alcohol use: Yes    Comment: 3/14 quit drinking 5 days ago and then drank last nite  . Drug use: Yes    Types: Marijuana, "Crack" cocaine    Comment: snorts and smokes cocaine ocassionally     Family History  Problem Relation Age of Onset  . Diabetes Mother   . Hypertension Mother     Allergies  Allergen Reactions  . Lipitor [Atorvastatin] Other (See Comments)    Sister states had to have Cardiac Surgery    Review of Systems: Please see pertinent ROS reviewed in HPI and problem based charting.  All other systems reviewed and negative.  Objective: Vitals:   11/18/19 1053  BP: (!) 165/96  Pulse: 92  Temp: (!) 97.3 F (36.3 C)  TempSrc: Oral  SpO2: 99%     There is no height or weight on file to calculate BMI.  Physical Exam Vitals reviewed.  Constitutional:      General: He is not in acute distress. Cardiovascular:     Comments: Hickman site is clean/dry/intact.  Musculoskeletal:        General: Deformity present.     Right lower leg: Edema present.     Comments: Findings of chronic stasis dermatitis.  Skin:    General: Skin is warm and dry.  Neurological:     Mental Status: He is alert.        Pertinent Labs and Microbiology  CBC Latest Ref Rng & Units 10/26/2019 10/24/2019 10/23/2019  WBC 4.0 - 10.5 K/uL 6.5 5.7 6.6   Hemoglobin 13.0 - 17.0 g/dL 8.6(L) 9.0(L) 8.6(L)  Hematocrit 39 - 52 % 28.6(L) 29.3(L) 27.2(L)  Platelets 150 - 400 K/uL 317 283 279   CMP Latest Ref Rng & Units 10/26/2019 10/25/2019 10/24/2019  Glucose 70 - 99 mg/dL 79 144(H) 101(H)  BUN 6 - 20 mg/dL 19 19 23(H)  Creatinine 0.61 - 1.24 mg/dL 1.94(H) 1.99(H) 2.62(H)  Sodium 135 - 145 mmol/L 143 139 142  Potassium 3.5 - 5.1 mmol/L 3.5 3.2(L) 3.5  Chloride 98 - 111 mmol/L 101 98 98  CO2 22 - 32 mmol/L _0 Calcium 8.9 - 10.3 mg/dL 8.1(L) 8.1(L) 8.4(L)  Total Protein 6.5 - 8.1 g/dL - - -  Total Bilirubin 0.3 - 1.2 mg/dL - - -  Alkaline Phos 38 - 126 U/L - - -  AST 15 - 41 U/L - - -  ALT 0 - 44 U/L - - -     Assessment and Plan: Renal insufficiency Will check labs today  Type 2 diabetes mellitus with diabetic polyneuropathy, without long-term current use of insulin (HCC) Complicating his ability for wound healing.  Discussed glycemic control .  Osteomyelitis ([place]) He is about 4 weeks into a planned 6 week empiric course of IV antibiotics for right foot osteomyelitis.  He sees orthopedic surgery next week.  Previously he has declined further amputation so I am not sure how successful we will be eradicating this infection.  His wound looks okay today and he reports no increased pain or drainage.  Plan: -- check labs today (CBC, BMP, ESR/CRP) -- complete 6 weeks of IV antibiotics -- remove Hickman after 12/02/19 -- follow up with orthopedic surgery -- if 6 weeks of IV abx fail to eradicate infection, he will more than likely need further debridement/amputation    Orders Placed This Encounter  Procedures  . CBC w/Diff  . BASIC METABOLIC PANEL WITH GFR  . Sedimentation rate  . C-reactive protein  . Ambulatory referral to Interventional Radiology    Referral Priority:   Routine    Referral Type:   Consultation    Referral Reason:   Specialty Services Required    Requested Specialty:   Interventional Radiology    Number of  Visits Requested:   1      I spent greater than 40 minutes with the patient including greater than 50% of time in face to face counsel of the patient and in coordination of their care.  Raynelle Highland for Infectious Disease [facility] 11/18/2019, 11:15 AM

## 2019-11-18 NOTE — Assessment & Plan Note (Signed)
Complicating his ability for wound healing.  Discussed glycemic control .

## 2019-11-18 NOTE — Patient Instructions (Signed)
Thank you for coming to see me today. It was a pleasure seeing you.  We will continue your antibiotics of vancomycin and cefepime through November 5.  Your hickman will need to be removed after that and I have placed a referral for interventional radiology to do so  Follow up with your orthopedic surgeon  Ask you facility about elevating your legs and anything else to help with the swelling in your feet.  If you have any questions or concerns, please do not hesitate to call the office at [phone].  Take Care,   Jule Ser, DO

## 2019-11-18 NOTE — Assessment & Plan Note (Signed)
He is about 4 weeks into a planned 6 week empiric course of IV antibiotics for right foot osteomyelitis.  He sees orthopedic surgery next week.  Previously he has declined further amputation so I am not sure how successful we will be eradicating this infection.  His wound looks okay today and he reports no increased pain or drainage.  Plan: -- check labs today (CBC, BMP, ESR/CRP) -- complete 6 weeks of IV antibiotics -- remove Hickman after 12/02/19 -- follow up with orthopedic surgery -- if 6 weeks of IV abx fail to eradicate infection, he will more than likely need further debridement/amputation

## 2019-11-19 LAB — BASIC METABOLIC PANEL WITH GFR
BUN/Creatinine Ratio: 13 (calc) (ref 6–22)
BUN: 21 mg/dL (ref 7–25)
CO2: 26 mmol/L (ref 20–32)
Calcium: 8.9 mg/dL (ref 8.6–10.3)
Chloride: 108 mmol/L (ref 98–110)
Creat: 1.62 mg/dL — ABNORMAL HIGH (ref 0.70–1.33)
GFR, Est African American: 54 mL/min/{1.73_m2} — ABNORMAL LOW (ref >=60)
GFR, Est Non African American: 47 mL/min/{1.73_m2} — ABNORMAL LOW (ref >=60)
Glucose, Bld: 136 mg/dL — ABNORMAL HIGH (ref 65–99)
Potassium: 3.9 mmol/L (ref 3.5–5.3)
Sodium: 142 mmol/L (ref 135–146)

## 2019-11-19 LAB — CBC WITH DIFFERENTIAL/PLATELET
Absolute Monocytes: 423 cells/uL (ref 200–950)
Basophils Absolute: 32 cells/uL (ref 0–200)
Basophils Relative: 0.7 %
Eosinophils Absolute: 520 cells/uL — ABNORMAL HIGH (ref 15–500)
Eosinophils Relative: 11.3 %
HCT: 28.1 % — ABNORMAL LOW (ref 38.5–50.0)
Hemoglobin: 8.7 g/dL — ABNORMAL LOW (ref 13.2–17.1)
Lymphs Abs: 1674 cells/uL (ref 850–3900)
MCH: 26.8 pg — ABNORMAL LOW (ref 27.0–33.0)
MCHC: 31 g/dL — ABNORMAL LOW (ref 32.0–36.0)
MCV: 86.5 fL (ref 80.0–100.0)
MPV: 10.1 fL (ref 7.5–12.5)
Monocytes Relative: 9.2 %
Neutro Abs: 1950 cells/uL (ref 1500–7800)
Neutrophils Relative %: 42.4 %
Platelets: 176 10*3/uL (ref 140–400)
RBC: 3.25 10*6/uL — ABNORMAL LOW (ref 4.20–5.80)
RDW: 13.9 % (ref 11.0–15.0)
Total Lymphocyte: 36.4 %
WBC: 4.6 10*3/uL (ref 3.8–10.8)

## 2019-11-19 LAB — SEDIMENTATION RATE: Sed Rate: 58 mm/h — ABNORMAL HIGH (ref 0–20)

## 2019-11-19 LAB — C-REACTIVE PROTEIN: CRP: 8.5 mg/L — ABNORMAL HIGH (ref <8.0)

## 2019-11-24 ENCOUNTER — Encounter: Payer: Self-pay | Admitting: Orthopedic Surgery

## 2019-11-24 ENCOUNTER — Other Ambulatory Visit: Payer: Self-pay

## 2019-11-24 ENCOUNTER — Ambulatory Visit (INDEPENDENT_AMBULATORY_CARE_PROVIDER_SITE_OTHER): Payer: Medicaid Other | Admitting: Orthopedic Surgery

## 2019-11-24 ENCOUNTER — Telehealth: Payer: Self-pay | Admitting: Orthopedic Surgery

## 2019-11-24 DIAGNOSIS — M869 Osteomyelitis, unspecified: Secondary | ICD-10-CM | POA: Diagnosis not present

## 2019-11-24 NOTE — Telephone Encounter (Signed)
Per ID note 10/22 IV antibiotics were through 11/5

## 2019-11-24 NOTE — Progress Notes (Signed)
Office Visit Note   Patient: Eric Morton           Date of Birth: [birth year]           MRN: [id number] Visit Date: 11/24/2019              Requested by: [name], NP [address] [place],  [place] [address] PCP: [name], NP  Chief Complaint  Patient presents with  . Right Foot - Pain      HPI: Patient is a 54 year old gentleman who was seen for initial evaluation for abscess ulceration osteomyelitis right great toe.  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  Patient also has renal disease.  Assessment & Plan: Visit Diagnoses:  1. Osteomyelitis of great toe of right foot ([place])     Plan: Discussed with the patient we will need to proceed with amputation of the first ray right foot.  Risks and benefits were discussed including risk of the wound not healing need for higher level amputation.  Patient states he understands wished to proceed we will set up surgery for next Wednesday as outpatient surgery with return to skilled nursing at [facility] after surgery.  Patient will also need compression socks for both lower extremities after surgery.  Follow-Up Instructions: Return in about 2 weeks (around 12/08/2019).   Ortho Exam  Patient is alert, oriented, no adenopathy, well-dressed, normal affect, normal respiratory effort. Examination patient has massive pitting edema in both lower extremities worse on the right than the left I cannot palpate a pulse secondary to the swelling.  He has an ulcer with healthy granulation tissue which appears to have intact microcirculation there is purulent drainage and the ulcer extends down to the MTP joint with exposed bone.  There is no ascending cellulitis no fluctuance no abscess no ischemic gangrenous changes.  Patient does not have protective sensation.  Patient's most recent hemoglobin A1c is 7.7 with an albumin of 2.5.  Imaging: No results  found. No images are attached to the encounter.  Labs: Lab Results  Component Value Date   HGBA1C 7.7 (H) 10/21/2019   HGBA1C 7.2 (A) 12/22/2017   HGBA1C 13.2 (A) 07/14/2017   ESRSEDRATE 58 (H) 11/18/2019   ESRSEDRATE 140 (H) 10/25/2019   CRP 8.5 (H) 11/18/2019   CRP 7.6 (H) 10/25/2019   CRP 17.9 (H) 10/21/2019   REPTSTATUS 10/26/2019 FINAL 10/21/2019   REPTSTATUS 10/26/2019 FINAL 10/21/2019   GRAMSTAIN  09/26/2011    MODERATE WBC PRESENT,BOTH PMN AND MONONUCLEAR NO SQUAMOUS EPITHELIAL CELLS SEEN NO ORGANISMS SEEN   GRAMSTAIN  09/26/2011    MODERATE WBC PRESENT,BOTH PMN AND MONONUCLEAR NO SQUAMOUS EPITHELIAL CELLS SEEN NO ORGANISMS SEEN   CULT  10/21/2019    NO GROWTH 5 DAYS Performed at [facility], [place] [address] [place], [place] [address]    CULT  10/21/2019    NO GROWTH 5 DAYS Performed at [facility] [address] [place], [place] [address]    LABORGA ESCHERICHIA COLI (A) 10/21/2019     Lab Results  Component Value Date   ALBUMIN 2.5 (L) 10/22/2019   ALBUMIN 2.6 (L) 10/21/2019   ALBUMIN 3.1 (L) 10/21/2019    Lab Results  Component Value Date   MG 2.2 10/26/2019   MG 1.7 10/25/2019   MG 1.5 (L) 10/24/2019   No results found for: VD25OH  No results found for: PREALBUMIN CBC EXTENDED Latest  Ref Rng & Units 11/18/2019 10/26/2019 10/24/2019  WBC 3.8 - 10.8 Thousand/uL 4.6 6.5 5.7  RBC 4.20 - 5.80 Million/uL 3.25(L) 3.24(L) 3.35(L)  HGB 13.2 - 17.1 g/dL 8.7(L) 8.6(L) 9.0(L)  HCT 38 - 50 % 28.1(L) 28.6(L) 29.3(L)  PLT 140 - 400 Thousand/uL 176 317 283  NEUTROABS 1,500 - 7,800 cells/uL 1,950 4.1 -  LYMPHSABS 850 - 3,900 cells/uL 1,674 1.5 -     There is no height or weight on file to calculate BMI.  Orders:  No orders of the defined types were placed in this encounter.  No orders of the defined types were placed in this encounter.    Procedures: No procedures performed  Clinical Data: No additional findings.  ROS:  All other  systems negative, except as noted in the HPI. Review of Systems  Objective: Vital Signs: There were no vitals taken for this visit.  Specialty Comments:  No specialty comments available.  PMFS History: Patient Active Problem List   Diagnosis Date Noted  . Endophthalmitis 10/26/2019  . Urinary retention   . Osteomyelitis ([place]) 10/22/2019  . AKI (acute kidney injury) ([place]) 10/21/2019  . Acute urinary retention 10/21/2019  . GERD without esophagitis 10/21/2019  . Bacterial conjunctivitis of right eye 10/21/2019  . Blisters of multiple sites 11/30/2017  . Renal insufficiency 07/16/2017  . Hemiparesis affecting right side as late effect of stroke ([place]) 10/29/2016  . Generalized weakness 10/23/2016  . Hyperlipidemia associated with type 2 diabetes mellitus ([place]) 07/27/2015  . History of CVA (cerebrovascular accident)   . Polycythemia vera ([place]) 03/21/2015  . Hx of adenomatous colonic polyps   . Essential hypertension 09/25/2011  . Type 2 diabetes mellitus with diabetic polyneuropathy, without long-term current use of insulin ([place]) 09/25/2011  . Cocaine abuse ([place]) 09/25/2011  . Tobacco use 09/25/2011   Past Medical History:  Diagnosis Date  . 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 Anxiety   . Chronic kidney disease    "jsut a little damage"  . Glaucoma   . History of stomach ulcers    not confirmed, never underwent diagnostic endo or radiology, this manifested as epigastric distress.   [name] Hx of adenomatous colonic polyps   . Hypercholesterolemia   . Hypertension   . Peripheral neuropathy   . Polycythemia vera ([place]) 2006  . Renal insufficiency 07/16/2017  . Stroke [facility])    slurred speech  . Substance abuse ([place])   . Type II diabetes mellitus (H[facility])     Family History  Problem Relation Age of Onset  . Diabetes Mother   . Hypertension Mother     Past Surgical History:  Procedure Laterality Date  . COLONOSCOPY WITH PROPOFOL N/A 04/28/2014   Procedure:  COLONOSCOPY WITH PROPOFOL;  Surgeon: Gatha Mayer, MD;  Location: [facility];  Service: Endoscopy;  Laterality: N/A;  . EYE SURGERY Right   . IR FLUORO GUIDE CV LINE RIGHT  10/25/2019  . IR US GUIDE VASC ACCESS RIGHT  10/25/2019  . IRRIGATION AND DEBRIDEMENT ABSCESS  09/26/2011   Procedure: IRRIGATION AND DEBRIDEMENT ABSCESS;  Surgeon: Imogene Burn. Georgette Dover, MD;  Location: [facility];  Service: General;  Laterality: Left;  Incision and Drainage left breast abscess   Social History   Occupational History  . Not on file  Tobacco Use  . Smoking status: Former Smoker    Packs/day: 0.25    Years: 32.00    Pack years: 8.00    Types: Cigarettes  .  Smokeless tobacco: Never Used  Vaping Use  . Vaping Use: Never used  Substance and Sexual Activity  . Alcohol use: Yes    Comment: 3/14 quit drinking 5 days ago and then drank last nite  . Drug use: Yes    Types: Marijuana, "Crack" cocaine    Comment: snorts and smokes cocaine ocassionally   . Sexual activity: Yes

## 2019-11-24 NOTE — Telephone Encounter (Signed)
I called and sw Tonya to advise of message below. Voiced understanding and will call with any other questions.

## 2019-11-24 NOTE — Telephone Encounter (Signed)
Pt sch for 11/30/19 right foot first ray amputation see message below and advise. Thanks

## 2019-11-24 NOTE — Telephone Encounter (Signed)
Tonya from [facility] called. She would like to know if the IV should be stopped today. Her call back number is [phone]

## 2019-11-25 ENCOUNTER — Other Ambulatory Visit: Payer: Self-pay | Admitting: Physician Assistant

## 2019-11-29 ENCOUNTER — Encounter (HOSPITAL_COMMUNITY): Payer: Self-pay | Admitting: Orthopedic Surgery

## 2019-11-29 ENCOUNTER — Telehealth: Payer: Self-pay

## 2019-11-29 NOTE — Progress Notes (Signed)
Anesthesia Chart Review: Same day workup  Pertinent history includes chronic kidney disease stage III, hyperlipidemia, multiple previous strokes with significant debility, insulin-dependent diabetes mellitus type 2, hypertension.  Recent admission September 2021 where he was diagnosed with right foot ulcer and possible osteomyelitis of the first metatarsal head.  He has subsequently followed up outpatient with ID and orthopedics and now needs first ray amputation.  IDDM 2, last A1c 7.7 on 10/21/2019.  Patient resides at [facility] and will need day of surgery Covid testing.  Labs from 11/18/2019 reviewed, creatinine 1.62 c/w history of CKD 3, hemoglobin 8.7 which per review of previous labs is near baseline.  Remainder of labs unremarkable.  Will need day of surgery evaluation.  EKG 10/21/2019: Sinus tachycardia.  Rate 117.  TTE 10/25/2016: - Left ventricle: The cavity size was normal. Wall thickness was  increased in a pattern of mild LVH. Systolic function was normal.  The estimated ejection fraction was in the range of 55% to 60%.  Wall motion was normal; there were no regional wall motion  abnormalities. Doppler parameters are consistent with abnormal  left ventricular relaxation (grade 1 diastolic dysfunction).   Impressions:   - No cardiac source of emboli was indentified.    Wynonia Musty [facility] Stay [facility]/Anesthesiology Phone [phone] 11/29/2019 2:30 PM

## 2019-11-29 NOTE — Telephone Encounter (Signed)
Floreen Comber with [facility] called in regards to the patient's tunneled hickman wanting to know if it will be removed here at our office. Floreen Comber advised this will have to be scheduled to be removed at the hospital and it not something that we do in our office. Kiwanda verbalized understanding. Kiyanna Biegler T Brooks Sailors

## 2019-11-29 NOTE — Progress Notes (Signed)
Spoke with Nurse Manager Floreen Comber [phone] @ [facility] for PAT information.   PCP -  Dr Daylene Katayama Cardiologist - n/a  Chest x-ray - n/a EKG - 10/24/19 Stress Test - n/a ECHO - 10/25/16 Cardiac Cath - n/a  . Do not take oral diabetes medicines (glipizide) tonight or the morning of surgery.  . THE NIGHT BEFORE SURGERY, take 7 units Insulin Glargine .     [name] THE MORNING OF SURGERY, do not take Humalog Insulin unless CBG is Greater than 220.  THen you may take 1/2 of your correction dose insulin.  .  If your blood sugar is less than 70 mg/dL, you will need to treat for low blood sugar: o Treat a low blood sugar (less than 70 mg/dL) with  cup of clear juice (cranberry or apple), 4 glucose tablets, OR glucose gel. o Recheck blood sugar in 15 minutes after treatment (to make sure it is greater than 70 mg/dL). If your blood sugar is not greater than 70 mg/dL on recheck, call [phone] for further instructions.  Aspirin Instructions: Follow your surgeon's instructions on when to stop aspirin prior to surgery,  If no instructions were given by your surgeon then you will need to call the office for those instructions.  ERAS: Clears til 8:45 am DOS, no drink  Anesthesia review: Yes  STOP now taking any Aspirin (unless otherwise instructed by your surgeon), Aleve, Naproxen, Ibuprofen, Motrin, Advil, Goody's, BC's, all herbal medications, fish oil, and all vitamins.   Coronavirus Screening Covdi test on DOS Do you have any of the following symptoms:  Cough yes/no: No Fever (>100.55F)  yes/no: No Runny nose yes/no: No Sore throat yes/no: No Difficulty breathing/shortness of breath  yes/no: No  Have you traveled in the last 14 days and where? yes/no: No  Nurse Mgr Floreen Comber verbalized understanding of instructions that were given via phone.  Instructions will be faxed to

## 2019-11-29 NOTE — Progress Notes (Signed)
[facility] [place], [place] AT [place] [address] Phone: [phone] Fax: [phone]   RE:  Eric Morton DOB 09-Aug-2065   Your procedure is scheduled on Wednesday, 11/30/19 at .11:53 am.  Report to [facility] Entrance "A" at 8:50 A.M., and check in at the Admitting office.  Call this number if you have problems the morning of surgery:  [phone]  Call [phone] if you have any questions prior to your surgery date Monday-Friday 8am-4pm   Remember:  Do not eat after midnight the night before your surgery-Tuesday  You may drink clear liquids until 8:45 am the morning of your surgery-Wednesday.   Clear liquids allowed are: Water, Non-Citrus Juices (without pulp), Carbonated Beverages, Clear Tea, Black Coffee Only, and Gatorade    Take these medicines the morning of surgery with A SIP OF WATER: amLODipine (NORVASC) dicyclomine (BENTYL)  gabapentin (NEURONTIN)  pantoprazole (PROTONIX)  rosuvastatin (CRESTOR)  sertraline (ZOLOFT) All eye drops Tylenol or Vicodin if needed   . Do not take oral diabetes medicines (glipizide) tonight or the morning of surgery.  . THE NIGHT BEFORE SURGERY, take 7 units Insulin Glargine  (Half Dose).     . THE MORNING OF SURGERY, do not take Humalog Insulin unless CBG is Greater than 220.  Then you may take 1/2 of your correction dose.  .  If your blood sugar is less than 70 mg/dL, you will need to treat for low blood sugar: o Treat a low blood sugar (less than 70 mg/dL) with  cup of clear juice (cranberry or apple), 4 glucose tablets, OR glucose gel. o Recheck blood sugar in 15 minutes after treatment (to make sure it is greater than 70 mg/dL). If your blood sugar is not greater than 70 mg/dL on recheck, call [phone] for further instructions.  Aspirin Instructions: Follow your surgeon's instructions on when to stop aspirin prior to surgery,  If no  instructions were given by your surgeon then you will need to call the office for those instructions.   As of today, STOP taking any Aspirin (unless otherwise instructed by your surgeon) Aleve, Naproxen, Ibuprofen, Motrin, Advil, Goody's, BC's, all herbal medications, fish oil, and all vitamins.                      Do not wear jewelry.            Do not wear lotions, powders, colognes, or deodorant.            Men may shave face and neck.            Do not bring valuables to the hospital.            [facility] is not responsible for any belongings or valuables.  Do NOT Smoke (Tobacco/Vaping) or drink Alcohol 24 hours prior to your procedure  Contacts, glasses, dentures or bridgework may not be worn into surgery.       Patients discharged the day of surgery will not be allowed to drive home, and someone needs to stay with them for 24 hours.  Oral Hygiene is also important to reduce your risk of infection.  Remember - BRUSH YOUR TEETH THE MORNING OF SURGERY WITH YOUR REGULAR TOOTHPASTE    Remember Day of Surgery: Wear Clean/Comfortable clothing the morning of surgery Do not apply any deodorants/lotions. Remember to brush your teeth WITH YOUR REGULAR  TOOTHPASTE.

## 2019-11-29 NOTE — Anesthesia Preprocedure Evaluation (Addendum)
Anesthesia Evaluation  Patient identified by MRN, date of birth, ID band Patient awake    Reviewed: Allergy & Precautions, NPO status , Patient's Chart, lab work & pertinent test results  History of Anesthesia Complications Negative for: history of anesthetic complications  Airway Mallampati: II  TM Distance: >3 FB Neck ROM: Full    Dental  (+) Poor Dentition, Missing, Dental Advisory Given   Pulmonary COPD, former smoker,  11/30/2019 SARS coronavirus NEG   breath sounds clear to auscultation       Cardiovascular hypertension, Pt. on medications (-) angina Rhythm:Regular Rate:Normal  '18 ECHO: 55% to 60%. Wall motion normal; grade 1 diastolic dysfunction   Neuro/Psych Anxiety CVA (R hemiparesis), Residual Symptoms    GI/Hepatic GERD  Medicated and Controlled,(+)     substance abuse  alcohol use and cocaine use,   Endo/Other  diabetes (glu 123), Oral Hypoglycemic Agents, Insulin Dependent  Renal/GU Renal InsufficiencyRenal disease (creat 1.67)     Musculoskeletal   Abdominal   Peds  Hematology  (+) Blood dyscrasia (Hb 8.7), anemia , plavix   Anesthesia Other Findings   Reproductive/Obstetrics                           Anesthesia Physical Anesthesia Plan  ASA: III  Anesthesia Plan: MAC and Regional   Post-op Pain Management:    Induction:   PONV Risk Score and Plan: 1 and Ondansetron  Airway Management Planned: Natural Airway and Simple Face Mask  Additional Equipment: None  Intra-op Plan:   Post-operative Plan:   Informed Consent: I have reviewed the patients History and Physical, chart, labs and discussed the procedure including the risks, benefits and alternatives for the proposed anesthesia with the patient or authorized representative who has indicated his/her understanding and acceptance.     Dental advisory given  Plan Discussed with: CRNA and Surgeon  Anesthesia  Plan Comments: (PAT note by Karoline Caldwell, PA-C:  Pertinent history includes chronic kidney disease stage III, hyperlipidemia, multiple previous strokes with significant debility, insulin-dependent diabetes mellitus type 2, hypertension.  Recent admission September 2021 where he was diagnosed with right foot ulcer and possible osteomyelitis of the first metatarsal head.  He has subsequently followed up outpatient with ID and orthopedics and now needs first ray amputation.  IDDM 2, last A1c 7.7 on 10/21/2019.  Patient resides at [facility] and will need day of surgery Covid testing.  Labs from 11/18/2019 reviewed, creatinine 1.62 c/w history of CKD 3, hemoglobin 8.7 which per review of previous labs is near baseline.  Remainder of labs unremarkable.  Will need day of surgery evaluation.  EKG 10/21/2019: Sinus tachycardia.  Rate 117.  TTE 10/25/2016: - Left ventricle: The cavity size was normal. Wall thickness was  increased in a pattern of mild LVH. Systolic function was normal.  The estimated ejection fraction was in the range of 55% to 60%.  Wall motion was normal; there were no regional wall motion  abnormalities. Doppler parameters are consistent with abnormal  left ventricular relaxation (grade 1 diastolic dysfunction).   Impressions:   - No cardiac source of emboli was indentified. )      Anesthesia Quick Evaluation

## 2019-11-29 NOTE — Progress Notes (Signed)
Spoke with Nurse Manager Floreen Comber [phone] @ [facility] for PAT information.   PCP -  Dr Daylene Katayama Cardiologist - n/a  Chest x-ray - n/a EKG - 10/24/19 Stress Test - n/a ECHO - 10/25/16 Cardiac Cath - n/a  ERAS: Clears til 8:45 am DOS, no drink  Anesthesia review: Yes  Coronavirus Screening Covdi test on DOS Do you have any of the following symptoms:  Cough yes/no: No Fever (>100.39F)  yes/no: No Runny nose yes/no: No Sore throat yes/no: No Difficulty breathing/shortness of breath  yes/no: No  Have you traveled in the last 14 days and where? yes/no: No  Nurse Mgr Floreen Comber verbalized understanding of instructions that were given via phone.  Instructions faxed to [phone].

## 2019-11-30 ENCOUNTER — Ambulatory Visit (HOSPITAL_COMMUNITY): Payer: Medicaid Other | Admitting: Physician Assistant

## 2019-11-30 ENCOUNTER — Ambulatory Visit (HOSPITAL_COMMUNITY)
Admission: RE | Admit: 2019-11-30 | Discharge: 2019-11-30 | Disposition: A | Discharge status: Skilled Nursing Facility | Payer: Medicaid Other | Source: Ambulatory Visit | Attending: Orthopedic Surgery | Admitting: Orthopedic Surgery

## 2019-11-30 ENCOUNTER — Encounter (HOSPITAL_COMMUNITY)
Admission: RE | Disposition: A | Discharge status: Skilled Nursing Facility | Payer: Self-pay | Source: Ambulatory Visit | Attending: Orthopedic Surgery

## 2019-11-30 ENCOUNTER — Encounter (HOSPITAL_COMMUNITY): Payer: Self-pay | Admitting: Orthopedic Surgery

## 2019-11-30 ENCOUNTER — Other Ambulatory Visit: Payer: Self-pay

## 2019-11-30 DIAGNOSIS — Z20822 Contact with and (suspected) exposure to covid-19: Secondary | ICD-10-CM | POA: Diagnosis not present

## 2019-11-30 DIAGNOSIS — L02611 Cutaneous abscess of right foot: Secondary | ICD-10-CM | POA: Diagnosis not present

## 2019-11-30 DIAGNOSIS — M869 Osteomyelitis, unspecified: Secondary | ICD-10-CM

## 2019-11-30 DIAGNOSIS — M8600 Acute hematogenous osteomyelitis, unspecified site: Secondary | ICD-10-CM

## 2019-11-30 HISTORY — DX: Osteomyelitis, unspecified: M86.9

## 2019-11-30 HISTORY — PX: AMPUTATION: SHX166

## 2019-11-30 LAB — GLUCOSE, CAPILLARY
Glucose-Capillary: 72 mg/dL (ref 70–99)
Glucose-Capillary: 123 mg/dL — ABNORMAL HIGH (ref 70–99)
Glucose-Capillary: 94 mg/dL (ref 70–99)
Glucose-Capillary: 86 mg/dL (ref 70–99)

## 2019-11-30 LAB — POCT I-STAT, CHEM 8
BUN: 18 mg/dL (ref 6–20)
Calcium, Ion: 1.22 mmol/L (ref 1.15–1.40)
Chloride: 108 mmol/L (ref 98–111)
Creatinine, Ser: 1.5 mg/dL — ABNORMAL HIGH (ref 0.61–1.24)
Glucose, Bld: 76 mg/dL (ref 70–99)
HCT: 30 % — ABNORMAL LOW (ref 39.0–52.0)
Hemoglobin: 10.2 g/dL — ABNORMAL LOW (ref 13.0–17.0)
Potassium: 3.4 mmol/L — ABNORMAL LOW (ref 3.5–5.1)
Sodium: 146 mmol/L — ABNORMAL HIGH (ref 135–145)
TCO2: 25 mmol/L (ref 22–32)

## 2019-11-30 LAB — SARS CORONAVIRUS 2 BY RT PCR (HOSPITAL ORDER, PERFORMED IN ~~LOC~~ HOSPITAL LAB): SARS Coronavirus 2: NEGATIVE

## 2019-11-30 SURGERY — AMPUTATION, FOOT, RAY
Anesthesia: Monitor Anesthesia Care | Site: Foot | Laterality: Right

## 2019-11-30 MED ORDER — LACTATED RINGERS IV SOLN: INTRAVENOUS | Status: DC

## 2019-11-30 MED ORDER — DEXTROSE 50 % IV SOLN
INTRAVENOUS | Status: AC
  Administered 2019-11-30: 25 mL via INTRAVENOUS
  Filled 2019-11-30: qty 50

## 2019-11-30 MED ORDER — FENTANYL CITRATE (PF) 250 MCG/5ML IJ SOLN
INTRAMUSCULAR | Status: AC
  Filled 2019-11-30: qty 5

## 2019-11-30 MED ORDER — CHLORHEXIDINE GLUCONATE 0.12 % MT SOLN
15.0000 mL | Freq: Once | OROMUCOSAL | Status: AC
  Administered 2019-11-30: 15 mL via OROMUCOSAL
  Filled 2019-11-30: qty 15

## 2019-11-30 MED ORDER — MIDAZOLAM HCL 2 MG/2ML IJ SOLN: 1.0000 mg | Freq: Once | INTRAMUSCULAR | Status: AC

## 2019-11-30 MED ORDER — OXYCODONE-ACETAMINOPHEN 5-325 MG PO TABS
1.0000 | ORAL_TABLET | ORAL | 0 refills | Status: AC | PRN
Start: 2019-11-30 — End: ?

## 2019-11-30 MED ORDER — 0.9 % SODIUM CHLORIDE (POUR BTL) OPTIME
TOPICAL | Status: DC | PRN
  Administered 2019-11-30: 1000 mL

## 2019-11-30 MED ORDER — MIDAZOLAM HCL 2 MG/2ML IJ SOLN: 0.5000 mg | Freq: Once | INTRAMUSCULAR | Status: DC | PRN

## 2019-11-30 MED ORDER — OXYCODONE HCL 5 MG/5ML PO SOLN: 5.0000 mg | Freq: Once | ORAL | Status: DC | PRN

## 2019-11-30 MED ORDER — OXYCODONE-ACETAMINOPHEN 5-325 MG PO TABS: 1.0000 | ORAL_TABLET | ORAL | 0 refills | Status: AC | PRN

## 2019-11-30 MED ORDER — ONDANSETRON HCL 4 MG/2ML IJ SOLN
INTRAMUSCULAR | Status: DC | PRN
  Administered 2019-11-30: 4 mg via INTRAVENOUS

## 2019-11-30 MED ORDER — ORAL CARE MOUTH RINSE: 15.0000 mL | Freq: Once | OROMUCOSAL | Status: AC

## 2019-11-30 MED ORDER — CEFAZOLIN SODIUM-DEXTROSE 2-4 GM/100ML-% IV SOLN
2.0000 g | INTRAVENOUS | Status: AC
  Administered 2019-11-30: 2 g via INTRAVENOUS
  Filled 2019-11-30: qty 100

## 2019-11-30 MED ORDER — PROMETHAZINE HCL 25 MG/ML IJ SOLN: 6.2500 mg | INTRAMUSCULAR | Status: DC | PRN

## 2019-11-30 MED ORDER — MEPERIDINE HCL 25 MG/ML IJ SOLN: 6.2500 mg | INTRAMUSCULAR | Status: DC | PRN

## 2019-11-30 MED ORDER — HEPARIN SOD (PORK) LOCK FLUSH 100 UNIT/ML IV SOLN
250.0000 [IU] | INTRAVENOUS | Status: AC | PRN
  Administered 2019-11-30: 250 [IU]
  Filled 2019-11-30: qty 2.5

## 2019-11-30 MED ORDER — ROPIVACAINE HCL 5 MG/ML IJ SOLN
INTRAMUSCULAR | Status: DC | PRN
  Administered 2019-11-30: 50 mL

## 2019-11-30 MED ORDER — FENTANYL CITRATE (PF) 100 MCG/2ML IJ SOLN: 25.0000 ug | INTRAMUSCULAR | Status: DC | PRN

## 2019-11-30 MED ORDER — FENTANYL CITRATE (PF) 100 MCG/2ML IJ SOLN
INTRAMUSCULAR | Status: AC
  Administered 2019-11-30: 50 ug via INTRAVENOUS
  Filled 2019-11-30: qty 2

## 2019-11-30 MED ORDER — PROPOFOL 500 MG/50ML IV EMUL
INTRAVENOUS | Status: DC | PRN
  Administered 2019-11-30: 75 ug/kg/min via INTRAVENOUS

## 2019-11-30 MED ORDER — OXYCODONE HCL 5 MG PO TABS: 5.0000 mg | ORAL_TABLET | Freq: Once | ORAL | Status: DC | PRN

## 2019-11-30 MED ORDER — FENTANYL CITRATE (PF) 100 MCG/2ML IJ SOLN: 50.0000 ug | Freq: Once | INTRAMUSCULAR | Status: AC

## 2019-11-30 MED ORDER — MIDAZOLAM HCL 2 MG/2ML IJ SOLN
INTRAMUSCULAR | Status: AC
  Administered 2019-11-30: 1 mg via INTRAVENOUS
  Filled 2019-11-30: qty 2

## 2019-11-30 MED ORDER — PROPOFOL 10 MG/ML IV BOLUS
INTRAVENOUS | Status: DC | PRN
  Administered 2019-11-30: 20 mg via INTRAVENOUS

## 2019-11-30 MED ORDER — DEXTROSE 50 % IV SOLN: 25.0000 mL | Freq: Once | INTRAVENOUS | Status: AC

## 2019-11-30 MED ORDER — ONDANSETRON HCL 4 MG/2ML IJ SOLN
INTRAMUSCULAR | Status: AC
  Filled 2019-11-30: qty 2

## 2019-11-30 SURGICAL SUPPLY — 31 items
BLADE SAW SGTL MED 73X18.5 STR (BLADE) IMPLANT
BLADE SURG 21 STRL SS (BLADE) ×2 IMPLANT
BNDG COHESIVE 4X5 TAN STRL (GAUZE/BANDAGES/DRESSINGS) IMPLANT
BNDG COHESIVE 6X5 TAN NS LF (GAUZE/BANDAGES/DRESSINGS) ×2 IMPLANT
BNDG GAUZE ELAST 4 BULKY (GAUZE/BANDAGES/DRESSINGS) ×2 IMPLANT
COVER SURGICAL LIGHT HANDLE (MISCELLANEOUS) ×2 IMPLANT
COVER WAND RF STERILE (DRAPES) IMPLANT
DRAPE U-SHAPE 47X51 STRL (DRAPES) ×2 IMPLANT
DRSG ADAPTIC 3X8 NADH LF (GAUZE/BANDAGES/DRESSINGS) ×2 IMPLANT
DRSG PAD ABDOMINAL 8X10 ST (GAUZE/BANDAGES/DRESSINGS) ×2 IMPLANT
DURAPREP 26ML APPLICATOR (WOUND CARE) ×2 IMPLANT
ELECT REM PT RETURN 9FT ADLT (ELECTROSURGICAL) ×2
ELECTRODE REM PT RTRN 9FT ADLT (ELECTROSURGICAL) ×1 IMPLANT
GAUZE SPONGE 4X4 12PLY STRL (GAUZE/BANDAGES/DRESSINGS) ×2 IMPLANT
GAUZE SPONGE 4X4 12PLY STRL LF (GAUZE/BANDAGES/DRESSINGS) ×2 IMPLANT
GLOVE BIOGEL PI IND STRL 9 (GLOVE) ×1 IMPLANT
GLOVE BIOGEL PI INDICATOR 9 (GLOVE) ×1
GLOVE SURG ORTHO 9.0 STRL STRW (GLOVE) ×2 IMPLANT
GOWN STRL REUS W/ TWL XL LVL3 (GOWN DISPOSABLE) ×2 IMPLANT
GOWN STRL REUS W/TWL XL LVL3 (GOWN DISPOSABLE) ×4
KIT BASIN OR (CUSTOM PROCEDURE TRAY) ×2 IMPLANT
KIT TURNOVER KIT B (KITS) ×2 IMPLANT
NS IRRIG 1000ML POUR BTL (IV SOLUTION) IMPLANT
PACK ORTHO EXTREMITY (CUSTOM PROCEDURE TRAY) ×2 IMPLANT
PAD ABD 8X10 STRL (GAUZE/BANDAGES/DRESSINGS) ×2 IMPLANT
PAD ARMBOARD 7.5X6 YLW CONV (MISCELLANEOUS) IMPLANT
STOCKINETTE IMPERVIOUS LG (DRAPES) IMPLANT
SUT ETHILON 2 0 PSLX (SUTURE) ×4 IMPLANT
TOWEL GREEN STERILE (TOWEL DISPOSABLE) ×2 IMPLANT
TUBE CONNECTING 12X1/4 (SUCTIONS) ×2 IMPLANT
YANKAUER SUCT BULB TIP NO VENT (SUCTIONS) ×2 IMPLANT

## 2019-11-30 NOTE — Progress Notes (Signed)
Orthopedic Tech Progress Note Patient Details:  TRAVARIS KOSH 01-30-1965 [id number] PACU RN called requesting a POST OP SHOE  Ortho Devices Type of Ortho Device: Postop shoe/boot Ortho Device/Splint Location: RLE Ortho Device/Splint Interventions: Application, Adjustment   Post Interventions Patient Tolerated: Well Instructions Provided: Care of device   Janit Pagan 11/30/2019, 3:21 PM

## 2019-11-30 NOTE — Anesthesia Postprocedure Evaluation (Signed)
Anesthesia Post Note  Patient: Eric Morton  Procedure(s) Performed: RIGHT FOOT 1ST RAY AMPUTATION (Right Foot)     Patient location during evaluation: PACU Anesthesia Type: Regional and MAC Level of consciousness: awake and alert, patient cooperative and oriented Pain management: pain level controlled Vital Signs Assessment: post-procedure vital signs reviewed and stable Respiratory status: spontaneous breathing, nonlabored ventilation and respiratory function stable Cardiovascular status: blood pressure returned to baseline and stable Postop Assessment: no apparent nausea or vomiting and adequate PO intake Anesthetic complications: no   No complications documented.  Last Vitals:  Vitals:   11/30/19 1013 11/30/19 1430  BP: (!) 158/92 124/72  Pulse: (!) 106 92  Resp: 18 15  Temp: (!) 36.4 C (!) 36.2 C  SpO2: 100% 100%    Last Pain:  Vitals:   11/30/19 1430  TempSrc:   PainSc: Asleep                 Makynleigh Breslin,E. Averleigh Savary

## 2019-11-30 NOTE — Interval H&P Note (Signed)
History and Physical Interval Note:  11/30/2019 1:19 PM  Eric Morton  has presented today for surgery, with the diagnosis of ABSCESS OSTEOMYELITIS RIGHT GREAT TOE.  The various methods of treatment have been discussed with the patient and family. After consideration of risks, benefits and other options for treatment, the patient has consented to  Procedure(s): RIGHT FOOT 1ST RAY AMPUTATION (Right) as a surgical intervention.  The patient's history has been reviewed, patient examined, no change in status, stable for surgery.  I have reviewed the patient's chart and labs.  Questions were answered to the patient's satisfaction.     Newt Minion

## 2019-11-30 NOTE — OR Nursing (Signed)
During the pre-surgical assessment, the patient told me that he was afraid at his facility.  He stated that "the nurses there aren't nice to me like you all are.  Last night one of the nurses was fired." He stated that he was left alone in the dark for a long period of time, that he talked to the "coordinator" and  "he turned it around on me.  They aren't nice to me there.  I'm afraid for my life."   He said that he wanted  someone to come and see him.  The patient stated that his facility was [facility] in [place], [place].. I told him that I would make a phone call and see what we could do to address the situation. I called PACU and spoke with Elna Breslow who arranged to have a consultation prior to the surgery.

## 2019-11-30 NOTE — Interval H&P Note (Signed)
History and Physical Interval Note:  11/30/2019 12:22 PM  Eric Morton  has presented today for surgery, with the diagnosis of ABSCESS OSTEOMYELITIS RIGHT GREAT TOE.  The various methods of treatment have been discussed with the patient and family. After consideration of risks, benefits and other options for treatment, the patient has consented to  Procedure(s): RIGHT FOOT 1ST RAY AMPUTATION (Right) as a surgical intervention.  The patient's history has been reviewed, patient examined, no change in status, stable for surgery.  I have reviewed the patient's chart and labs.  Questions were answered to the patient's satisfaction.     Newt Minion

## 2019-11-30 NOTE — Op Note (Addendum)
11/30/2019  2:15 PM  PATIENT:  Eric Morton    PRE-OPERATIVE DIAGNOSIS:  ABSCESS OSTEOMYELITIS RIGHT GREAT TOE  POST-OPERATIVE DIAGNOSIS:  Same  PROCEDURE:  RIGHT FOOT 1ST RAY AMPUTATION Local tissue rearrangement for wound closure 10 x 4 cm.  SURGEON:  Newt Minion, MD  PHYSICIAN ASSISTANT:None ANESTHESIA:   General  PREOPERATIVE INDICATIONS:  Eric Morton is a  54 y.o. male with a diagnosis of ABSCESS OSTEOMYELITIS RIGHT GREAT TOE who failed conservative measures and elected for surgical management.    The risks benefits and alternatives were discussed with the patient preoperatively including but not limited to the risks of infection, bleeding, nerve injury, cardiopulmonary complications, the need for revision surgery, among others, and the patient was willing to proceed.  OPERATIVE IMPLANTS: none  @ENCIMAGES @  OPERATIVE FINDINGS: good bleeding, no abscess  OPERATIVE PROCEDURE: Patient was brought the operating room after undergoing a regional anesthetic.  After adequate levels anesthesia were obtained patient's right lower extremity was prepped using DuraPrep draped into a sterile field a timeout was called.  A racquet incision was made around the first ray including the ulcerative tissue.  This left a wound that was 10 x 4 cm.  The first ray was resected through the base of the first metatarsal.  Electrocautery was used hemostasis the wound was irrigated with normal saline.  Local tissue rearrangement was used to close the wound 10 x 4 cm.  A sterile dressing was applied patient was taken the PACU in stable condition.   DISCHARGE PLANNING:  Antibiotic duration: Preoperative antibiotics  Weightbearing: Touchdown weightbearing on the right  Pain medication: Prescription for Percocet  Dressing care/ Wound VAC: Change dressing as needed  Ambulatory devices: Walker  Discharge to: Discharge back to skilled nursing.  Follow-up: In the office 1 week post  operative.

## 2019-11-30 NOTE — Anesthesia Procedure Notes (Signed)
Anesthesia Regional Block: Ankle block   Pre-Anesthetic Checklist: ,, timeout performed, Correct Patient, Correct Site, Correct Laterality, Correct Procedure, Correct Position, site marked, Risks and benefits discussed,  Surgical consent,  Pre-op evaluation,  At surgeon's request and post-op pain management  Laterality: Right and Lower  Prep: chloraprep       Needles:  Injection technique: Single-shot  Needle Type: Quincke     Needle Length: 4cm  Needle Gauge: 25     Additional Needles:   (perineural infiltration)  Narrative:  Start time: 11/30/2019 12:14 PM End time: 11/30/2019 12:23 PM Injection made incrementally with aspirations every 5 mL.  Performed by: Personally  Anesthesiologist: Annye Asa, MD  Additional Notes: Pt identified in Holding room.  Monitors applied. Working IV access confirmed. Sterile prep R ankle.  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  Total 50cc 0.5% Ropivacaine injected incrementally after negative test dose.  Patient asymptomatic, VSS, no heme aspirated, tolerated well.   Jenita Seashore, MD

## 2019-11-30 NOTE — H&P (Signed)
Eric Morton is an 54 y.o. male.   Chief Complaint: Right foot osteomyelitis HPI: Patient is a 54 year old gentleman who was seen for initial evaluation for abscess ulceration osteomyelitis right great toe.  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  Patient also has renal disease.  Past Medical History:  Diagnosis Date  . 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 Anxiety   . Chronic kidney disease    "jsut a little damage"  . Glaucoma   . History of stomach ulcers    not confirmed, never underwent diagnostic endo or radiology, this manifested as epigastric distress.   [name] Hx of adenomatous colonic polyps   . Hypercholesterolemia   . Hypertension   . Osteomyelitis of great toe of right foot ([place])   . Peripheral neuropathy   . Polycythemia vera ([place]) 2006  . Renal insufficiency 07/16/2017  . Stroke [facility])    slurred speech  . Substance abuse ([place])   . Type II diabetes mellitus ([place])     Past Surgical History:  Procedure Laterality Date  . COLONOSCOPY WITH PROPOFOL N/A 04/28/2014   Procedure: COLONOSCOPY WITH PROPOFOL;  Surgeon: Gatha Mayer, MD;  Location: [place];  Service: Endoscopy;  Laterality: N/A;  . EYE SURGERY Right   . IR FLUORO GUIDE CV LINE RIGHT  10/25/2019  . IR US GUIDE VASC ACCESS RIGHT  10/25/2019  . IRRIGATION AND DEBRIDEMENT ABSCESS  09/26/2011   Procedure: IRRIGATION AND DEBRIDEMENT ABSCESS;  Surgeon: Imogene Burn. Georgette Dover, MD;  Location: [place];  Service: General;  Laterality: Left;  Incision and Drainage left breast abscess    Family History  Problem Relation Age of Onset  . Diabetes Mother   . Hypertension Mother    Social History:  reports that he has quit smoking. His smoking use included cigarettes. He has a 8.00 pack-year smoking history. He has never used smokeless tobacco. He reports previous alcohol use. He reports previous drug use. Drugs: Marijuana and "Crack"  cocaine.  Allergies:  Allergies  Allergen Reactions  . Lipitor [Atorvastatin] Other (See Comments)    Sister states had to have Cardiac Surgery    No medications prior to admission.    No results found for this or any previous visit (from the past 48 hour(s)). No results found.  Review of Systems  All other systems reviewed and are negative.   Height 5\' 8"  (1.727 m), weight 85.5 kg. Physical Exam  Patient is alert, oriented, no adenopathy, well-dressed, normal affect, normal respiratory effort. Examination patient has massive pitting edema in both lower extremities worse on the right than the left I cannot palpate a pulse secondary to the swelling.  He has an ulcer with healthy granulation tissue which appears to have intact microcirculation there is purulent drainage and the ulcer extends down to the MTP joint with exposed bone.  There is no ascending cellulitis no fluctuance no abscess no ischemic gangrenous changes.  Patient does not have protective sensation.  Patient's most recent hemoglobin A1c is 7.7 with an albumin of 2.5.heart RRR Lungs Clear Assessment/Plan 1. Osteomyelitis of great toe of right foot ([place])     Plan: Discussed with the patient we will need to proceed with amputation of the first ray right foot.  Risks and benefits were discussed including risk of the wound not healing need for higher level amputation.  Patient states he understands wished to proceed we will set up  surgery for next Wednesday as outpatient surgery with return to skilled nursing at [facility] after surgery.  Patient will also need compression socks for both lower extremities after surgery.   Bevely Palmer Avalina Benko, PA 11/30/2019, 6:45 AM

## 2019-11-30 NOTE — Transfer of Care (Signed)
Immediate Anesthesia Transfer of Care Note  Patient: Eric Morton  Procedure(s) Performed: RIGHT FOOT 1ST RAY AMPUTATION (Right Foot)  Patient Location: PACU  Anesthesia Type:MAC and Regional  Level of Consciousness: oriented, sedated and patient cooperative  Airway & Oxygen Therapy: Patient Spontanous Breathing and Patient connected to face mask oxygen  Post-op Assessment: Report given to RN and Post -op Vital signs reviewed and stable  Post vital signs: Reviewed  Last Vitals:  Vitals Value Taken Time  BP 124/72 11/30/19 1428  Temp    Pulse 93 11/30/19 1430  Resp 13 11/30/19 1430  SpO2 100 % 11/30/19 1430  Vitals shown include unvalidated device data.  Last Pain:  Vitals:   11/30/19 1120  TempSrc:   PainSc: 8          Complications: No complications documented.

## 2019-11-30 NOTE — Anesthesia Procedure Notes (Signed)
Procedure Name: MAC Date/Time: 11/30/2019 1:45 PM Performed by: Jenne Campus, CRNA Pre-anesthesia Checklist: Patient identified, Emergency Drugs available, Suction available and Patient being monitored Oxygen Delivery Method: Simple face mask
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
## 2019-12-01 ENCOUNTER — Encounter (HOSPITAL_COMMUNITY): Payer: Self-pay | Admitting: Orthopedic Surgery

## 2019-12-05 ENCOUNTER — Other Ambulatory Visit (HOSPITAL_COMMUNITY): Payer: Self-pay | Admitting: Internal Medicine

## 2019-12-06 ENCOUNTER — Telehealth: Payer: Self-pay

## 2019-12-06 ENCOUNTER — Other Ambulatory Visit: Payer: Self-pay | Admitting: Internal Medicine

## 2019-12-06 DIAGNOSIS — M869 Osteomyelitis, unspecified: Secondary | ICD-10-CM

## 2019-12-06 NOTE — Telephone Encounter (Signed)
Spoke to [place] at [facility] to verify that the patient is still a resident of theirs. Requested phone number to give to IR for scheduling the removal of the patient's tunneled catheter.   Called Jennifer in IR and gave her the direct line to schedule IR appointment. Phone number for contact at [facility] is [phone]. Anderson Malta will call to schedule patient's IR appointment.   Beryle Flock, RN

## 2019-12-07 ENCOUNTER — Other Ambulatory Visit: Payer: Self-pay

## 2019-12-07 ENCOUNTER — Encounter: Payer: Self-pay | Admitting: Family

## 2019-12-07 ENCOUNTER — Ambulatory Visit (INDEPENDENT_AMBULATORY_CARE_PROVIDER_SITE_OTHER): Payer: Medicaid Other | Admitting: Family

## 2019-12-07 ENCOUNTER — Ambulatory Visit (HOSPITAL_COMMUNITY)
Admission: RE | Admit: 2019-12-07 | Discharge: 2019-12-07 | Disposition: A | Discharge status: Home / Self Care | Payer: Medicaid Other | Source: Ambulatory Visit | Attending: Internal Medicine | Admitting: Internal Medicine

## 2019-12-07 VITALS — Ht 67.0 in | Wt 180.0 lb

## 2019-12-07 DIAGNOSIS — Z4901 Encounter for fitting and adjustment of extracorporeal dialysis catheter: Secondary | ICD-10-CM | POA: Diagnosis not present

## 2019-12-07 DIAGNOSIS — Z89411 Acquired absence of right great toe: Secondary | ICD-10-CM

## 2019-12-07 DIAGNOSIS — M869 Osteomyelitis, unspecified: Secondary | ICD-10-CM

## 2019-12-07 HISTORY — PX: IR REMOVAL TUN CV CATH W/O FL: IMG2289

## 2019-12-07 MED ORDER — LIDOCAINE HCL 1 % IJ SOLN
INTRAMUSCULAR | Status: DC | PRN
  Administered 2019-12-07: 5 mL

## 2019-12-07 NOTE — Procedures (Signed)
PROCEDURE SUMMARY:  Successful removal of right IJ tunneled CVC, removed intact with cuff retained in soft tissue. No immediate complications.  EBL <2 mL. Pressure dressing (gauze + tegaderm) applied to site. Patient tolerated well.   D/C instructions: No showering for 48 hours post-removal. No submerging (swimming, bathing) for 7 days post-removal.  Please see imaging section of Epic for full dictation.   Earley Abide PA-C 12/07/2019 12:49 PM

## 2019-12-07 NOTE — Progress Notes (Signed)
Post-Op Visit Note   Patient: Eric Morton           Date of Birth: 1965/12/09           MRN: [id number] Visit Date: 12/07/2019 PCP: [name], NP  Chief Complaint: No chief complaint on file.   HPI:  HPI The patient is a 54 year old gentleman seen status post right foot first ray amputation 1 week ago he has been nonweightbearing.  He does reside at skilled nursing today is in a wheelchair Ortho Exam Incision well approximated with sutures there is no gaping no erythema no surrounding maceration or sign of infection  Visit Diagnoses:  1. 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  Dry dressing changes.  Continue to minimize weightbearing may touchdown weight-bear through the heel for transfers only follow-up in 2 weeks  Follow-Up Instructions: No follow-ups on file.   Imaging: No results found.  Orders:  No orders of the defined types were placed in this encounter.  No orders of the defined types were placed in this encounter.    PMFS History: Patient Active Problem List   Diagnosis Date Noted  . Endophthalmitis 10/26/2019  . Urinary retention   . Osteomyelitis of great toe of right foot ([place]) 10/22/2019  . AKI (acute kidney injury) ([place]) 10/21/2019  . Acute urinary retention 10/21/2019  . GERD without esophagitis 10/21/2019  . Bacterial conjunctivitis of right eye 10/21/2019  . Blisters of multiple sites 11/30/2017  . Renal insufficiency 07/16/2017  . Hemiparesis affecting right side as late effect of stroke ([place]) 10/29/2016  . Generalized weakness 10/23/2016  . Hyperlipidemia associated with type 2 diabetes mellitus ([place]) 07/27/2015  . History of CVA (cerebrovascular accident)   . Polycythemia vera ([place]) 03/21/2015  . Hx of adenomatous colonic polyps   . Essential hypertension 09/25/2011  . Type 2 diabetes mellitus with diabetic polyneuropathy, without long-term current use of insulin ([place])  09/25/2011  . Cocaine abuse ([place]) 09/25/2011  . Tobacco use 09/25/2011   Past Medical History:  Diagnosis Date  . 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 Anxiety   . Chronic kidney disease    "jsut a little damage"  . Glaucoma   . History of stomach ulcers    not confirmed, never underwent diagnostic endo or radiology, this manifested as epigastric distress.   [name] Hx of adenomatous colonic polyps   . Hypercholesterolemia   . Hypertension   . Osteomyelitis of great toe of right foot ([place])   . Peripheral neuropathy   . Polycythemia vera ([place]) 2006  . Renal insufficiency 07/16/2017  . Stroke [facility])    slurred speech  . Substance abuse ([place])   . Type II diabetes mellitus (HCC)     Family History  Problem Relation Age of Onset  . Diabetes Mother   . Hypertension Mother     Past Surgical History:  Procedure Laterality Date  . AMPUTATION Right 11/30/2019   Procedure: RIGHT FOOT 1ST RAY AMPUTATION;  Surgeon: Newt Minion, MD;  Location: [place];  Service: Orthopedics;  Laterality: Right;  . COLONOSCOPY WITH PROPOFOL N/A 04/28/2014   Procedure: COLONOSCOPY WITH PROPOFOL;  Surgeon: Gatha Mayer, MD;  Location: [place];  Service: Endoscopy;  Laterality: N/A;  . EYE SURGERY Right   . IR FLUORO GUIDE CV LINE RIGHT  10/25/2019  . IR US GUIDE VASC ACCESS RIGHT  10/25/2019  .  IRRIGATION AND DEBRIDEMENT ABSCESS  09/26/2011   Procedure: IRRIGATION AND DEBRIDEMENT ABSCESS;  Surgeon: Imogene Burn. Georgette Dover, MD;  Location: [place];  Service: General;  Laterality: Left;  Incision and Drainage left breast abscess   Social History   Occupational History  . Not on file  Tobacco Use  . Smoking status: Former Smoker    Packs/day: 0.25    Years: 32.00    Pack years: 8.00    Types: Cigarettes  . Smokeless tobacco: Never Used  Vaping Use  . Vaping Use: Never used  Substance and Sexual Activity  . Alcohol use: Not Currently    Comment: 3/14 quit drinking 5 days ago and then drank last  nite  . Drug use: Not Currently    Types: Marijuana, "Crack" cocaine    Comment: snorts and smokes cocaine ocassionally   . Sexual activity: Not Currently

## 2019-12-21 ENCOUNTER — Encounter: Payer: Self-pay | Admitting: Family

## 2019-12-21 ENCOUNTER — Ambulatory Visit (INDEPENDENT_AMBULATORY_CARE_PROVIDER_SITE_OTHER): Payer: Medicaid Other | Admitting: Family

## 2019-12-21 ENCOUNTER — Other Ambulatory Visit: Payer: Self-pay

## 2019-12-21 VITALS — Ht 67.0 in | Wt 180.0 lb

## 2019-12-21 DIAGNOSIS — Z89411 Acquired absence of right great toe: Secondary | ICD-10-CM | POA: Insufficient documentation

## 2019-12-21 NOTE — Progress Notes (Signed)
Post-Op Visit Note   Patient: Eric Morton           Date of Birth: 08-25-1965           MRN: [id number] Visit Date: 12/21/2019 PCP: [name], NP  Chief Complaint:  Chief Complaint  Patient presents with  . Right Foot - Routine Post Op    11/30/19 RIGHT FOOT 1ST RAY AMPUTATION     HPI:  HPI The patient is a 54 year old gentleman who is seen status post right foot first ray amputation Nov 3 he is accompanied by a CNA from skilled nursing  Ortho Exam On examination of the right foot the incision is healing slowly.  The sutures are in place there is no gaping drainage no erythema no sign of infection is not yet fully healed distally 3 proximal sutures were harvested today without incident moderate edema of the foot  Visit Diagnoses: No diagnosis found.  Plan: Continue with daily Dial soap cleansing dry dressing changes will follow up once more in 2 weeks plan to harvest the remaining sutures at that time hope to advance his weightbearing at that time  Follow-Up Instructions: Return in about 2 weeks (around 01/04/2020).   Imaging: No results found.  Orders:  No orders of the defined types were placed in this encounter.  No orders of the defined types were placed in this encounter.    PMFS History: Patient Active Problem List   Diagnosis Date Noted  . Endophthalmitis 10/26/2019  . Urinary retention   . Osteomyelitis of great toe of right foot ([place]) 10/22/2019  . AKI (acute kidney injury) ([place]) 10/21/2019  . Acute urinary retention 10/21/2019  . GERD without esophagitis 10/21/2019  . Bacterial conjunctivitis of right eye 10/21/2019  . Blisters of multiple sites 11/30/2017  . Renal insufficiency 07/16/2017  . Hemiparesis affecting right side as late effect of stroke ([place]) 10/29/2016  . Generalized weakness 10/23/2016  . Hyperlipidemia associated with type 2 diabetes mellitus ([place]) 07/27/2015  . History of CVA (cerebrovascular accident)   . Polycythemia vera  ([place]) 03/21/2015  . Hx of adenomatous colonic polyps   . Essential hypertension 09/25/2011  . Type 2 diabetes mellitus with diabetic polyneuropathy, without long-term current use of insulin ([place]) 09/25/2011  . Cocaine abuse ([place]) 09/25/2011  . Tobacco use 09/25/2011   Past Medical History:  Diagnosis Date  . 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 Anxiety   . Chronic kidney disease    "jsut a little damage"  . Glaucoma   . History of stomach ulcers    not confirmed, never underwent diagnostic endo or radiology, this manifested as epigastric distress.   [name] Hx of adenomatous colonic polyps   . Hypercholesterolemia   . Hypertension   . Osteomyelitis of great toe of right foot ([place])   . Peripheral neuropathy   . Polycythemia vera ([place]) 2006  . Renal insufficiency 07/16/2017  . Stroke [facility])    slurred speech  . Substance abuse ([place])   . Type II diabetes mellitus (HCC)     Family History  Problem Relation Age of Onset  . Diabetes Mother   . Hypertension Mother     Past Surgical History:  Procedure Laterality Date  . AMPUTATION Right 11/30/2019   Procedure: RIGHT FOOT 1ST RAY AMPUTATION;  Surgeon: Newt Minion, MD;  Location: [place];  Service: Orthopedics;  Laterality: Right;  . COLONOSCOPY WITH PROPOFOL N/A 04/28/2014   Procedure: COLONOSCOPY WITH  PROPOFOL;  Surgeon: Gatha Mayer, MD;  Location[facility]COPY;  Service: Endoscopy;  Laterality: N/A;  . EYE SURGERY Right   . IR FLUORO GUIDE CV LINE RIGHT  10/25/2019  . IR REMOVAL TUN CV CATH W/O FL  12/07/2019  . IR US GUIDE VASC ACCESS RIGHT  10/25/2019  . IRRIGATION AND DEBRIDEMENT ABSCESS  09/26/2011   Procedure: IRRIGATION AND DEBRIDEMENT ABSCESS;  Surgeon: Imogene Burn. Georgette Dover, MD;  Location: [place];  Service: General;  Laterality: Left;  Incision and Drainage left breast abscess   Social History   Occupational History  . Not on file  Tobacco Use  . Smoking status: Former Smoker    Packs/day: 0.25    Years:  32.00    Pack years: 8.00    Types: Cigarettes  . Smokeless tobacco: Never Used  Vaping Use  . Vaping Use: Never used  Substance and Sexual Activity  . Alcohol use: Not Currently    Comment: 3/14 quit drinking 5 days ago and then drank last nite  . Drug use: Not Currently    Types: Marijuana, "Crack" cocaine    Comment: snorts and smokes cocaine ocassionally   . Sexual activity: Not Currently

## 2020-01-06 ENCOUNTER — Ambulatory Visit (INDEPENDENT_AMBULATORY_CARE_PROVIDER_SITE_OTHER): Payer: Medicaid Other | Admitting: Family

## 2020-01-06 ENCOUNTER — Encounter: Payer: Self-pay | Admitting: Family

## 2020-01-06 DIAGNOSIS — Z89411 Acquired absence of right great toe: Secondary | ICD-10-CM

## 2020-01-06 NOTE — Progress Notes (Signed)
Post-Op Visit Note   Patient: Eric Morton           Date of Birth: 08-21-1965           MRN: [id number] Visit Date: 01/06/2020 PCP: [name], NP  Chief Complaint: No chief complaint on file.   HPI:  HPI The patient is a 54 year old gentleman seen nearly 3 weeks status post right first ray amputation he is currently residing at skilled nursing  Ortho Exam Incision is healing well there are 2 remaining areas that have not yet fully healed.  There is no gaping no drainage no erythema no edema  Visit Diagnoses: No diagnosis found.  Plan: Continue daily dose of cleansing.  Dry dressing changes.  Minimize weightbearing.  Follow-up in 10 days for suture removal.  Anticipate advancing his weightbearing to as tolerated at that time  Follow-Up Instructions: Return in about 2 weeks (around 01/20/2020).   Imaging: No results found.  Orders:  No orders of the defined types were placed in this encounter.  No orders of the defined types were placed in this encounter.    PMFS History: Patient Active Problem List   Diagnosis Date Noted  . 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 Endophthalmitis 10/26/2019  . Urinary retention   . Osteomyelitis of great toe of right foot ([place]) 10/22/2019  . AKI (acute kidney injury) ([place]) 10/21/2019  . Acute urinary retention 10/21/2019  . GERD without esophagitis 10/21/2019  . Bacterial conjunctivitis of right eye 10/21/2019  . Blisters of multiple sites 11/30/2017  . Renal insufficiency 07/16/2017  . Hemiparesis affecting right side as late effect of stroke ([place]) 10/29/2016  . Generalized weakness 10/23/2016  . Hyperlipidemia associated with type 2 diabetes mellitus ([place]) 07/27/2015  . History of CVA (cerebrovascular accident)   . Polycythemia vera ([place]) 03/21/2015  . Hx of adenomatous colonic polyps   . Essential hypertension 09/25/2011  . Type 2 diabetes mellitus with diabetic polyneuropathy,  without long-term current use of insulin ([place]) 09/25/2011  . Cocaine abuse ([place]) 09/25/2011  . Tobacco use 09/25/2011   Past Medical History:  Diagnosis Date  . 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 Anxiety   . Chronic kidney disease    "jsut a little damage"  . Glaucoma   . History of stomach ulcers    not confirmed, never underwent diagnostic endo or radiology, this manifested as epigastric distress.   [name] Hx of adenomatous colonic polyps   . Hypercholesterolemia   . Hypertension   . Osteomyelitis of great toe of right foot ([place])   . Peripheral neuropathy   . Polycythemia vera ([place]) 2006  . Renal insufficiency 07/16/2017  . Stroke [facility])    slurred speech  . Substance abuse ([place])   . Type II diabetes mellitus (HCC)     Family History  Problem Relation Age of Onset  . Diabetes Mother   . Hypertension Mother     Past Surgical History:  Procedure Laterality Date  . AMPUTATION Right 11/30/2019   Procedure: RIGHT FOOT 1ST RAY AMPUTATION;  Surgeon: Newt Minion, MD;  Location: [place];  Service: Orthopedics;  Laterality: Right;  . COLONOSCOPY WITH PROPOFOL N/A 04/28/2014   Procedure: COLONOSCOPY WITH PROPOFOL;  Surgeon: Gatha Mayer, MD;  Location: [place];  Service: Endoscopy;  Laterality: N/A;  . EYE SURGERY Right   . IR FLUORO GUIDE CV LINE RIGHT  10/25/2019  . IR  REMOVAL TUN CV CATH W/O FL  12/07/2019  . IR US GUIDE VASC ACCESS RIGHT  10/25/2019  . IRRIGATION AND DEBRIDEMENT ABSCESS  09/26/2011   Procedure: IRRIGATION AND DEBRIDEMENT ABSCESS;  Surgeon: Imogene Burn. Georgette Dover, MD;  Location: [place];  Service: General;  Laterality: Left;  Incision and Drainage left breast abscess   Social History   Occupational History  . Not on file  Tobacco Use  . Smoking status: Former Smoker    Packs/day: 0.25    Years: 32.00    Pack years: 8.00    Types: Cigarettes  . Smokeless tobacco: Never Used  Vaping Use  . Vaping Use: Never used  Substance and Sexual Activity   . Alcohol use: Not Currently    Comment: 3/14 quit drinking 5 days ago and then drank last nite  . Drug use: Not Currently    Types: Marijuana, "Crack" cocaine    Comment: snorts and smokes cocaine ocassionally   . Sexual activity: Not Currently

## 2020-01-30 ENCOUNTER — Ambulatory Visit: Payer: Medicaid Other | Admitting: Orthopedic Surgery

## 2020-03-19 ENCOUNTER — Ambulatory Visit (INDEPENDENT_AMBULATORY_CARE_PROVIDER_SITE_OTHER): Payer: Medicaid Other | Admitting: Physician Assistant

## 2020-03-19 ENCOUNTER — Other Ambulatory Visit: Payer: Self-pay

## 2020-03-19 ENCOUNTER — Encounter: Payer: Self-pay | Admitting: Orthopedic Surgery

## 2020-03-19 DIAGNOSIS — Z89411 Acquired absence of right great toe: Secondary | ICD-10-CM

## 2020-03-19 NOTE — Progress Notes (Signed)
Office Visit Note   Patient: Eric Morton           Date of Birth: 14-Aug-1965           MRN: [id number] Visit Date: 03/19/2020              Requested by: [name], NP [address] [place],  [place] [address] PCP: [name], NP  Chief Complaint  Patient presents with  . Right Foot - Routine Post Op    11/30/19 right foot 1st ray amputation       HPI: Patient is status post right first ray amputation.  Also has small pressure ulcer on the medial side of his left foot.  He is residing in a nursing facility.  He states that overall he is doing well.  He thinks the ulcers on the left foot arose from wearing a shoe that was too tight he is currently not wearing shoes  Assessment & Plan: Visit Diagnoses: No diagnosis found.  Plan: Continue with Iodosorb over superficial ulcer.  On the left.  Recommended moisture such as cocoa butter Shea butter for the right foot.  Follow-up in a month  Follow-Up Instructions: No follow-ups on file.   Ortho Exam  Patient is alert, oriented, no adenopathy, well-dressed, normal affect, normal respiratory effort. Right foot: Well-healed surgical incision no cellulitis no fluctuance no signs of infection.  He does have a thickened callus at the distal end of the incision which was debrided to healthy tissue.  Left foot small superficial ulcer on the base of the great toe if this does not go deep at all secondary to pressure from the shoes there is also a small blister distally to this.  No cellulitis new Iodosorb and Band-Aid applied.  No signs of infection  Imaging: No results found. No images are attached to the encounter.  Labs: Lab Results  Component Value Date   HGBA1C 7.7 (H) 10/21/2019   HGBA1C 7.2 (A) 12/22/2017   HGBA1C 13.2 (A) 07/14/2017   ESRSEDRATE 58 (H) 11/18/2019   ESRSEDRATE 140 (H) 10/25/2019   CRP 8.5 (H) 11/18/2019   CRP 7.6 (H) 10/25/2019   CRP 17.9 (H) 10/21/2019   REPTSTATUS 10/26/2019 FINAL 10/21/2019    REPTSTATUS 10/26/2019 FINAL 10/21/2019   GRAMSTAIN  09/26/2011    MODERATE WBC PRESENT,BOTH PMN AND MONONUCLEAR NO SQUAMOUS EPITHELIAL CELLS SEEN NO ORGANISMS SEEN   GRAMSTAIN  09/26/2011    MODERATE WBC PRESENT,BOTH PMN AND MONONUCLEAR NO SQUAMOUS EPITHELIAL CELLS SEEN NO ORGANISMS SEEN   CULT  10/21/2019    NO GROWTH 5 DAYS Performed at [facility], [place] [address] [place], [place] [address]    CULT  10/21/2019    NO GROWTH 5 DAYS Performed at [facility] [address] [place], [place] [address]    LABORGA ESCHERICHIA COLI (A) 10/21/2019     Lab Results  Component Value Date   ALBUMIN 2.5 (L) 10/22/2019   ALBUMIN 2.6 (L) 10/21/2019   ALBUMIN 3.1 (L) 10/21/2019    Lab Results  Component Value Date   MG 2.2 10/26/2019   MG 1.7 10/25/2019   MG 1.5 (L) 10/24/2019   No results found for: VD25OH  No results found for: PREALBUMIN CBC EXTENDED Latest Ref Rng & Units 11/30/2019 11/18/2019 10/26/2019  WBC 3.8 - 10.8 Thousand/uL - 4.6 6.5  RBC 4.20 - 5.80 Million/uL - 3.25(L) 3.24(L)  HGB 13.0 - 17.0 g/dL 10.2(L) 8.7(L) 8.6(L)  HCT 39.0 -  52.0 % 30.0(L) 28.1(L) 28.6(L)  PLT 140 - 400 Thousand/uL - 176 317  NEUTROABS 1,500 - 7,800 cells/uL - 1,950 4.1  LYMPHSABS 850 - 3,900 cells/uL - 1,674 1.5     There is no height or weight on file to calculate BMI.  Orders:  No orders of the defined types were placed in this encounter.  No orders of the defined types were placed in this encounter.    Procedures: No procedures performed  Clinical Data: No additional findings.  ROS:  All other systems negative, except as noted in the HPI. Review of Systems  Objective: Vital Signs: There were no vitals taken for this visit.  Specialty Comments:  No specialty comments available.  PMFS History: Patient Active Problem List   Diagnosis Date Noted  . 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 Endophthalmitis 10/26/2019  .  Urinary retention   . Osteomyelitis of great toe of right foot ([place]) 10/22/2019  . AKI (acute kidney injury) ([place]) 10/21/2019  . Acute urinary retention 10/21/2019  . GERD without esophagitis 10/21/2019  . Bacterial conjunctivitis of right eye 10/21/2019  . Blisters of multiple sites 11/30/2017  . Renal insufficiency 07/16/2017  . Hemiparesis affecting right side as late effect of stroke ([employer]) 10/29/2016  . Generalized weakness 10/23/2016  . Hyperlipidemia associated with type 2 diabetes mellitus ([place]) 07/27/2015  . History of CVA (cerebrovascular accident)   . Polycythemia vera ([place]) 03/21/2015  . Hx of adenomatous colonic polyps   . Essential hypertension 09/25/2011  . Type 2 diabetes mellitus with diabetic polyneuropathy, without long-term current use of insulin ([place]) 09/25/2011  . Cocaine abuse ([place]) 09/25/2011  . Tobacco use 09/25/2011   Past Medical History:  Diagnosis Date  . 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 Anxiety   . Chronic kidney disease    "jsut a little damage"  . Glaucoma   . History of stomach ulcers    not confirmed, never underwent diagnostic endo or radiology, this manifested as epigastric distress.   [name] Hx of adenomatous colonic polyps   . Hypercholesterolemia   . Hypertension   . Osteomyelitis of great toe of right foot ([place])   . Peripheral neuropathy   . Polycythemia vera ([place]) 2006  . Renal insufficiency 07/16/2017  . Stroke [facility])    slurred speech  . Substance abuse ([place])   . Type II diabetes mellitus (HCC)     Family History  Problem Relation Age of Onset  . Diabetes Mother   . Hypertension Mother     Past Surgical History:  Procedure Laterality Date  . AMPUTATION Right 11/30/2019   Procedure: RIGHT FOOT 1ST RAY AMPUTATION;  Surgeon: Newt Minion, MD;  Location: [facility];  Service: Orthopedics;  Laterality: Right;  . COLONOSCOPY WITH PROPOFOL N/A 04/28/2014   Procedure: COLONOSCOPY WITH PROPOFOL;  Surgeon: Gatha Mayer,  MD;  Location: [facility];  Service: Endoscopy;  Laterality: N/A;  . EYE SURGERY Right   . IR FLUORO GUIDE CV LINE RIGHT  10/25/2019  . IR REMOVAL TUN CV CATH W/O FL  12/07/2019  . IR US GUIDE VASC ACCESS RIGHT  10/25/2019  . IRRIGATION AND DEBRIDEMENT ABSCESS  09/26/2011   Procedure: IRRIGATION AND DEBRIDEMENT ABSCESS;  Surgeon: Imogene Burn. Georgette Dover, MD;  Location: [facility];  Service: General;  Laterality: Left;  Incision and Drainage left breast abscess   Social History   Occupational History  . Not on file  Tobacco Use  . Smoking status: Former Smoker    Packs/day: 0.25    Years: 32.00    Pack years: 8.00    Types: Cigarettes  . Smokeless tobacco: Never Used  Vaping Use  . Vaping Use: Never used  Substance and Sexual Activity  . Alcohol use: Not Currently    Comment: 3/14 quit drinking 5 days ago and then drank last nite  . Drug use: Not Currently    Types: Marijuana, "Crack" cocaine    Comment: snorts and smokes cocaine ocassionally   . Sexual activity: Not Currently

## 2020-04-16 ENCOUNTER — Ambulatory Visit (INDEPENDENT_AMBULATORY_CARE_PROVIDER_SITE_OTHER): Payer: Medicaid Other | Admitting: Physician Assistant

## 2020-04-16 ENCOUNTER — Encounter: Payer: Self-pay | Admitting: Orthopedic Surgery

## 2020-04-16 DIAGNOSIS — M869 Osteomyelitis, unspecified: Secondary | ICD-10-CM

## 2020-04-16 NOTE — Progress Notes (Signed)
Office Visit Note   Patient: Eric Morton           Date of Birth: 1965/08/16           MRN: [id number] Visit Date: 04/16/2020              Requested by: [name], NP [address] [place],  [place] [address] PCP: [name], NP  Chief Complaint  Patient presents with  . Right Foot - Follow-up    11/30/19 right foot 1st ray amputation       HPI: This is a pleasant 55 year old gentleman who follows up today for his bilateral feet.  He currently resides in a nursing facility.  The right foot he is status post ray amputation 4 months ago.  He was doing well with this.  On the left foot he has developed a ulcer on the medial side of his forefoot secondary to shoes that were too tight.  Since that time he has not been wearing shoes just 2 pairs of socks  Assessment & Plan: Visit Diagnoses: No diagnosis found.  Plan: Explained to the patient importance of wearing shoes to protect his feet.  Wrote an order for them to get supportive wider shoes for him.  Continue daily moisturizing and cleansing.  Onychomycotic nails were trimmed x4  Follow-Up Instructions: No follow-ups on file.   Ortho Exam  Patient is alert, oriented, no adenopathy, well-dressed, normal affect, normal respiratory effort. Right foot well-healed surgical amputation small eschar at the end was just abraded to help soft tissue.  No ascending cellulitis no redness.  On the left foot he has a medial thin eschar at the place of an ulcer it does not probe deeply no surrounding erythema no ascending colitis or signs of infection  Imaging: No results found. No images are attached to the encounter.  Labs: Lab Results  Component Value Date   HGBA1C 7.7 (H) 10/21/2019   HGBA1C 7.2 (A) 12/22/2017   HGBA1C 13.2 (A) 07/14/2017   ESRSEDRATE 58 (H) 11/18/2019   ESRSEDRATE 140 (H) 10/25/2019   CRP 8.5 (H) 11/18/2019   CRP 7.6 (H) 10/25/2019   CRP 17.9 (H) 10/21/2019   REPTSTATUS 10/26/2019 FINAL 10/21/2019    REPTSTATUS 10/26/2019 FINAL 10/21/2019   GRAMSTAIN  09/26/2011    MODERATE WBC PRESENT,BOTH PMN AND MONONUCLEAR NO SQUAMOUS EPITHELIAL CELLS SEEN NO ORGANISMS SEEN   GRAMSTAIN  09/26/2011    MODERATE WBC PRESENT,BOTH PMN AND MONONUCLEAR NO SQUAMOUS EPITHELIAL CELLS SEEN NO ORGANISMS SEEN   CULT  10/21/2019    NO GROWTH 5 DAYS Performed at [facility], [place] [address] [place], [place] [address]    CULT  10/21/2019    NO GROWTH 5 DAYS Performed at [facility] [address], [place],  [address]    LABORGA ESCHERICHIA COLI (A) 10/21/2019     Lab Results  Component Value Date   ALBUMIN 2.5 (L) 10/22/2019   ALBUMIN 2.6 (L) 10/21/2019   ALBUMIN 3.1 (L) 10/21/2019    Lab Results  Component Value Date   MG 2.2 10/26/2019   MG 1.7 10/25/2019   MG 1.5 (L) 10/24/2019   No results found for: VD25OH  No results found for: PREALBUMIN CBC EXTENDED Latest Ref Rng & Units 11/30/2019 11/18/2019 10/26/2019  WBC 3.8 - 10.8 Thousand/uL - 4.6 6.5  RBC 4.20 - 5.80 Million/uL - 3.25(L) 3.24(L)  HGB 13.0 - 17.0 g/dL 10.2(L) 8.7(L) 8.6(L)  HCT 39.0 - 52.0 %  30.0(L) 28.1(L) 28.6(L)  PLT 140 - 400 Thousand/uL - 176 317  NEUTROABS 1,500 - 7,800 cells/uL - 1,950 4.1  LYMPHSABS 850 - 3,900 cells/uL - 1,674 1.5     There is no height or weight on file to calculate BMI.  Orders:  No orders of the defined types were placed in this encounter.  No orders of the defined types were placed in this encounter.    Procedures: No procedures performed  Clinical Data: No additional findings.  ROS:  All other systems negative, except as noted in the HPI. Review of Systems  Objective: Vital Signs: There were no vitals taken for this visit.  Specialty Comments:  No specialty comments available.  PMFS History: Patient Active Problem List   Diagnosis Date Noted  . 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 Endophthalmitis 10/26/2019  .  Urinary retention   . Osteomyelitis of great toe of right foot ([place]) 10/22/2019  . AKI (acute kidney injury) ([place]) 10/21/2019  . Acute urinary retention 10/21/2019  . GERD without esophagitis 10/21/2019  . Bacterial conjunctivitis of right eye 10/21/2019  . Blisters of multiple sites 11/30/2017  . Renal insufficiency 07/16/2017  . Hemiparesis affecting right side as late effect of stroke ([place]) 10/29/2016  . Generalized weakness 10/23/2016  . Hyperlipidemia associated with type 2 diabetes mellitus ([place]) 07/27/2015  . History of CVA (cerebrovascular accident)   . Polycythemia vera ([place]) 03/21/2015  . Hx of adenomatous colonic polyps   . Essential hypertension 09/25/2011  . Type 2 diabetes mellitus with diabetic polyneuropathy, without long-term current use of insulin ([place]) 09/25/2011  . Cocaine abuse ([place]) 09/25/2011  . Tobacco use 09/25/2011   Past Medical History:  Diagnosis Date  . 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 Anxiety   . Chronic kidney disease    "jsut a little damage"  . Glaucoma   . History of stomach ulcers    not confirmed, never underwent diagnostic endo or radiology, this manifested as epigastric distress.   [name] Hx of adenomatous colonic polyps   . Hypercholesterolemia   . Hypertension   . Osteomyelitis of great toe of right foot ([place])   . Peripheral neuropathy   . Polycythemia vera ([place]) 2006  . Renal insufficiency 07/16/2017  . Stroke [facility])    slurred speech  . Substance abuse ([place])   . Type II diabetes mellitus (HCC)     Family History  Problem Relation Age of Onset  . Diabetes Mother   . Hypertension Mother     Past Surgical History:  Procedure Laterality Date  . AMPUTATION Right 11/30/2019   Procedure: RIGHT FOOT 1ST RAY AMPUTATION;  Surgeon: Newt Minion, MD;  Location: [facility];  Service: Orthopedics;  Laterality: Right;  . COLONOSCOPY WITH PROPOFOL N/A 04/28/2014   Procedure: COLONOSCOPY WITH PROPOFOL;  Surgeon: Gatha Mayer,  MD;  Location: [facility];  Service: Endoscopy;  Laterality: N/A;  . EYE SURGERY Right   . IR FLUORO GUIDE CV LINE RIGHT  10/25/2019  . IR REMOVAL TUN CV CATH W/O FL  12/07/2019  . IR US GUIDE VASC ACCESS RIGHT  10/25/2019  . IRRIGATION AND DEBRIDEMENT ABSCESS  09/26/2011   Procedure: IRRIGATION AND DEBRIDEMENT ABSCESS;  Surgeon: Imogene Burn. Georgette Dover, MD;  Location: [facility];  Service: General;  Laterality: Left;  Incision and Drainage left breast abscess   Social History   Occupational History  . Not on file  Tobacco  Use  . Smoking status: Former Smoker    Packs/day: 0.25    Years: 32.00    Pack years: 8.00    Types: Cigarettes  . Smokeless tobacco: Never Used  Vaping Use  . Vaping Use: Never used  Substance and Sexual Activity  . Alcohol use: Not Currently    Comment: 3/14 quit drinking 5 days ago and then drank last nite  . Drug use: Not Currently    Types: Marijuana, "Crack" cocaine    Comment: snorts and smokes cocaine ocassionally   . Sexual activity: Not Currently

## 2020-05-28 ENCOUNTER — Ambulatory Visit (INDEPENDENT_AMBULATORY_CARE_PROVIDER_SITE_OTHER): Payer: Medicaid Other

## 2020-05-28 ENCOUNTER — Encounter: Payer: Self-pay | Admitting: Orthopedic Surgery

## 2020-05-28 ENCOUNTER — Ambulatory Visit (INDEPENDENT_AMBULATORY_CARE_PROVIDER_SITE_OTHER): Payer: Medicaid Other | Admitting: Physician Assistant

## 2020-05-28 DIAGNOSIS — M25551 Pain in right hip: Secondary | ICD-10-CM

## 2020-05-28 NOTE — Progress Notes (Signed)
Office Visit Note   Patient: Eric Morton           Date of Birth: 1965/04/15           MRN: [id number] Visit Date: 05/28/2020              Requested by: [name], NP [address] [place],  [place] [address] PCP: [name], NP  Chief Complaint  Patient presents with  . Right Foot - Follow-up    11/30/19 right foot 1st ray amputation   . Right Hip - Pain      HPI: Patient is a pleasant 55 year old gentleman who resides in a nursing facility.  He is status post first ray amputation in November and is doing very well.  He comes in today complaining of right hip and groin pain.  He denies any injuries notices it when he first gets up to transfer to a wheelchair denies any radicular symptoms sometimes when he turns his leg a certain way he feels his leg gives out  Assessment & Plan: Visit Diagnoses:  1. Pain in right hip     Plan: Patient will be referred to Dr. Ernestina Patches for an intra-articular injection into the right hip follow-up with [place] as needed  Follow-Up Instructions: No follow-ups on file.   Ortho Exam  Patient is alert, oriented, no adenopathy, well-dressed, normal affect, normal respiratory effort. Right hip.  He has no pain but very little motion with internal and external rotation of the hip.  Tenderness in the groin no radicular findings negative straight leg raise amputation has healed very well  Imaging: No results found. No images are attached to the encounter.  Labs: Lab Results  Component Value Date   HGBA1C 7.7 (H) 10/21/2019   HGBA1C 7.2 (A) 12/22/2017   HGBA1C 13.2 (A) 07/14/2017   ESRSEDRATE 58 (H) 11/18/2019   ESRSEDRATE 140 (H) 10/25/2019   CRP 8.5 (H) 11/18/2019   CRP 7.6 (H) 10/25/2019   CRP 17.9 (H) 10/21/2019   REPTSTATUS 10/26/2019 FINAL 10/21/2019   REPTSTATUS 10/26/2019 FINAL 10/21/2019   GRAMSTAIN  09/26/2011    MODERATE WBC PRESENT,BOTH PMN AND MONONUCLEAR NO SQUAMOUS EPITHELIAL CELLS SEEN NO ORGANISMS SEEN    GRAMSTAIN  09/26/2011    MODERATE WBC PRESENT,BOTH PMN AND MONONUCLEAR NO SQUAMOUS EPITHELIAL CELLS SEEN NO ORGANISMS SEEN   CULT  10/21/2019    NO GROWTH 5 DAYS Performed at [facility], [place] [address] [place], [place] [address]    CULT  10/21/2019    NO GROWTH 5 DAYS Performed at [facility] [address] [place], [place] [address]    LABORGA ESCHERICHIA COLI (A) 10/21/2019     Lab Results  Component Value Date   ALBUMIN 2.5 (L) 10/22/2019   ALBUMIN 2.6 (L) 10/21/2019   ALBUMIN 3.1 (L) 10/21/2019    Lab Results  Component Value Date   MG 2.2 10/26/2019   MG 1.7 10/25/2019   MG 1.5 (L) 10/24/2019   No results found for: VD25OH  No results found for: PREALBUMIN CBC EXTENDED Latest Ref Rng & Units 11/30/2019 11/18/2019 10/26/2019  WBC 3.8 - 10.8 Thousand/uL - 4.6 6.5  RBC 4.20 - 5.80 Million/uL - 3.25(L) 3.24(L)  HGB 13.0 - 17.0 g/dL 10.2(L) 8.7(L) 8.6(L)  HCT 39.0 - 52.0 % 30.0(L) 28.1(L) 28.6(L)  PLT 140 - 400 Thousand/uL - 176 317  NEUTROABS 1,500 - 7,800 cells/uL - 1,950 4.1  LYMPHSABS 850 - 3,900 cells/uL - 1,674  1.5     There is no height or weight on file to calculate BMI.  Orders:  Orders Placed This Encounter  Procedures  . XR HIP UNILAT W OR W/O PELVIS 2-3 VIEWS RIGHT  . Ambulatory referral to Physical Medicine Rehab   No orders of the defined types were placed in this encounter.    Procedures: No procedures performed  Clinical Data: No additional findings.  ROS:  All other systems negative, except as noted in the HPI. Review of Systems  Objective: Vital Signs: There were no vitals taken for this visit.  Specialty Comments:  No specialty comments available.  PMFS History: Patient Active Problem List   Diagnosis Date Noted  . 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 Endophthalmitis 10/26/2019  . Urinary retention   . Osteomyelitis of great toe of right foot ([place]) 10/22/2019  . AKI  (acute kidney injury) ([place]) 10/21/2019  . Acute urinary retention 10/21/2019  . GERD without esophagitis 10/21/2019  . Bacterial conjunctivitis of right eye 10/21/2019  . Blisters of multiple sites 11/30/2017  . Renal insufficiency 07/16/2017  . Hemiparesis affecting right side as late effect of stroke ([place]) 10/29/2016  . Generalized weakness 10/23/2016  . Hyperlipidemia associated with type 2 diabetes mellitus ([place]) 07/27/2015  . History of CVA (cerebrovascular accident)   . Polycythemia vera ([place]) 03/21/2015  . Hx of adenomatous colonic polyps   . Essential hypertension 09/25/2011  . Type 2 diabetes mellitus with diabetic polyneuropathy, without long-term current use of insulin ([place]) 09/25/2011  . Cocaine abuse ([place]) 09/25/2011  . Tobacco use 09/25/2011   Past Medical History:  Diagnosis Date  . 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 Anxiety   . Chronic kidney disease    "jsut a little damage"  . Glaucoma   . History of stomach ulcers    not confirmed, never underwent diagnostic endo or radiology, this manifested as epigastric distress.   [name] Hx of adenomatous colonic polyps   . Hypercholesterolemia   . Hypertension   . Osteomyelitis of great toe of right foot ([place])   . Peripheral neuropathy   . Polycythemia vera ([place]) 2006  . Renal insufficiency 07/16/2017  . Stroke [facility])    slurred speech  . Substance abuse ([place])   . Type II diabetes mellitus (HCC)     Family History  Problem Relation Age of Onset  . Diabetes Mother   . Hypertension Mother     Past Surgical History:  Procedure Laterality Date  . AMPUTATION Right 11/30/2019   Procedure: RIGHT FOOT 1ST RAY AMPUTATION;  Surgeon: Newt Minion, MD;  Location: [facility];  Service: Orthopedics;  Laterality: Right;  . COLONOSCOPY WITH PROPOFOL N/A 04/28/2014   Procedure: COLONOSCOPY WITH PROPOFOL;  Surgeon: Gatha Mayer, MD;  Location: [facility];  Service: Endoscopy;  Laterality: N/A;  . EYE SURGERY Right    . IR FLUORO GUIDE CV LINE RIGHT  10/25/2019  . IR REMOVAL TUN CV CATH W/O FL  12/07/2019  . IR US GUIDE VASC ACCESS RIGHT  10/25/2019  . IRRIGATION AND DEBRIDEMENT ABSCESS  09/26/2011   Procedure: IRRIGATION AND DEBRIDEMENT ABSCESS;  Surgeon: Imogene Burn. Georgette Dover, MD;  Location: [facility];  Service: General;  Laterality: Left;  Incision and Drainage left breast abscess   Social History   Occupational History  . Not on file  Tobacco Use  . Smoking status: Former Smoker    Packs/day: 0.25  Years: 32.00    Pack years: 8.00    Types: Cigarettes  . Smokeless tobacco: Never Used  Vaping Use  . Vaping Use: Never used  Substance and Sexual Activity  . Alcohol use: Not Currently    Comment: 3/14 quit drinking 5 days ago and then drank last nite  . Drug use: Not Currently    Types: Marijuana, "Crack" cocaine    Comment: snorts and smokes cocaine ocassionally   . Sexual activity: Not Currently

## 2020-06-28 ENCOUNTER — Other Ambulatory Visit: Payer: Self-pay

## 2020-06-28 ENCOUNTER — Ambulatory Visit (INDEPENDENT_AMBULATORY_CARE_PROVIDER_SITE_OTHER): Payer: Medicaid Other | Admitting: Physical Medicine and Rehabilitation

## 2020-06-28 ENCOUNTER — Ambulatory Visit: Payer: Self-pay

## 2020-06-28 ENCOUNTER — Encounter: Payer: Self-pay | Admitting: Physical Medicine and Rehabilitation

## 2020-06-28 DIAGNOSIS — M25551 Pain in right hip: Secondary | ICD-10-CM

## 2020-06-28 MED ORDER — BUPIVACAINE HCL 0.25 % IJ SOLN
4.0000 mL | INTRAMUSCULAR | Status: AC | PRN
  Administered 2020-06-28: 4 mL via INTRA_ARTICULAR

## 2020-06-28 MED ORDER — TRIAMCINOLONE ACETONIDE 40 MG/ML IJ SUSP
60.0000 mg | INTRAMUSCULAR | Status: AC | PRN
  Administered 2020-06-28: 60 mg via INTRA_ARTICULAR

## 2020-06-28 NOTE — Progress Notes (Signed)
   Eric Morton - 55 y.o. male MRN [id number]  Date of birth: August 22, 1965  Office Visit Note: Visit Date: 06/28/2020 PCP: [name], NP Referred by: [name], NP  Subjective: Chief Complaint  Patient presents with  . Right Hip - Pain   HPI:  Eric Morton is a 55 y.o. male who comes in today at the request of [place], PA-C for planned Right anesthetic hip arthrogram with fluoroscopic guidance.  The patient has failed conservative care including home exercise, medications, time and activity modification.  This injection will be diagnostic and hopefully therapeutic.  Please see requesting physician notes for further details and justification.   ROS Otherwise per HPI.  Assessment & Plan: Visit Diagnoses:    ICD-10-CM   1. Pain in right hip  M25.551 Large Joint Inj: R hip joint    XR C-ARM NO REPORT    Plan: No additional findings.   Meds & Orders: No orders of the defined types were placed in this encounter.   Orders Placed This Encounter  Procedures  . Large Joint Inj: R hip joint  . XR C-ARM NO REPORT    Follow-up: No follow-ups on file.   Procedures: Large Joint Inj: R hip joint on 06/28/2020 10:00 AM Indications: diagnostic evaluation and pain Details: 22 G 3.5 in needle, fluoroscopy-guided anterior approach  Arthrogram: No  Medications: 4 mL bupivacaine 0.25 %; 60 mg triamcinolone acetonide 40 MG/ML Outcome: tolerated well, no immediate complications  There was excellent flow of contrast producing a partial arthrogram of the hip. The patient did have relief of symptoms during the anesthetic phase of the injection. Procedure, treatment alternatives, risks and benefits explained, specific risks discussed. Consent was given by the patient. Immediately prior to procedure a time out was called to verify the correct patient, procedure, equipment, support staff and site/side marked as required. Patient was prepped and draped in the usual sterile fashion.           Clinical History: No specialty comments available.     Objective:  VS:  HT:    WT:   BMI:     BP:   HR: bpm  TEMP: ( )  RESP:  Physical Exam   Imaging: No results found.

## 2020-06-28 NOTE — Progress Notes (Signed)
Pt state right hip pain. Pt mention the pain pill he takes didn't help Tramadol. Pt state walking, standing and laying down makes the pain worse. Pt state he takes pain meds to help ease his pain.  Numeric Pain Rating Scale and Functional Assessment Average Pain 6   In the last MONTH (on 0-10 scale) has pain interfered with the following?  1. General activity like being  able to carry out your everyday physical activities such as walking, climbing stairs, carrying groceries, or moving a chair?  Rating(10)   +Driver, -BT, -[place] Allergies.
